# Patient Record
Sex: Female | Born: 1975
Health system: Southern US, Community
[De-identification: ages and names within clinical notes are randomized; demographics above are authoritative.]

## PROBLEM LIST (undated history)

## (undated) DIAGNOSIS — F32A Depression, unspecified: Secondary | ICD-10-CM

## (undated) DIAGNOSIS — I209 Angina pectoris, unspecified: Secondary | ICD-10-CM

## (undated) DIAGNOSIS — I509 Heart failure, unspecified: Secondary | ICD-10-CM

## (undated) DIAGNOSIS — F419 Anxiety disorder, unspecified: Secondary | ICD-10-CM

## (undated) DIAGNOSIS — F329 Major depressive disorder, single episode, unspecified: Secondary | ICD-10-CM

## (undated) DIAGNOSIS — I472 Ventricular tachycardia, unspecified: Secondary | ICD-10-CM

## (undated) DIAGNOSIS — R0602 Shortness of breath: Secondary | ICD-10-CM

## (undated) DIAGNOSIS — I499 Cardiac arrhythmia, unspecified: Secondary | ICD-10-CM

## (undated) DIAGNOSIS — E669 Obesity, unspecified: Secondary | ICD-10-CM

## (undated) DIAGNOSIS — I2510 Atherosclerotic heart disease of native coronary artery without angina pectoris: Secondary | ICD-10-CM

## (undated) DIAGNOSIS — I213 ST elevation (STEMI) myocardial infarction of unspecified site: Secondary | ICD-10-CM

## (undated) DIAGNOSIS — E785 Hyperlipidemia, unspecified: Secondary | ICD-10-CM

## (undated) DIAGNOSIS — Z720 Tobacco use: Secondary | ICD-10-CM

## (undated) DIAGNOSIS — I4729 Other ventricular tachycardia: Secondary | ICD-10-CM

## (undated) DIAGNOSIS — K219 Gastro-esophageal reflux disease without esophagitis: Secondary | ICD-10-CM

## (undated) DIAGNOSIS — J45909 Unspecified asthma, uncomplicated: Secondary | ICD-10-CM

## (undated) DIAGNOSIS — I2109 ST elevation (STEMI) myocardial infarction involving other coronary artery of anterior wall: Principal | ICD-10-CM

## (undated) DIAGNOSIS — M1990 Unspecified osteoarthritis, unspecified site: Secondary | ICD-10-CM

## (undated) DIAGNOSIS — R51 Headache: Secondary | ICD-10-CM

## (undated) HISTORY — PX: TUBAL LIGATION: SHX77

## (undated) HISTORY — DX: Tobacco use: Z72.0

## (undated) HISTORY — PX: GALLBLADDER SURGERY: SHX652

## (undated) HISTORY — PX: CORONARY ANGIOPLASTY WITH STENT PLACEMENT: SHX49

## (undated) HISTORY — PX: CHOLECYSTECTOMY: SHX55

---

## 2001-04-17 ENCOUNTER — Emergency Department (HOSPITAL_COMMUNITY): Admission: EM | Admit: 2001-04-17 | Discharge: 2001-04-17 | Payer: Self-pay | Admitting: Emergency Medicine

## 2001-05-15 ENCOUNTER — Emergency Department (HOSPITAL_COMMUNITY): Admission: EM | Admit: 2001-05-15 | Discharge: 2001-05-15 | Payer: Self-pay | Admitting: Internal Medicine

## 2001-05-15 ENCOUNTER — Emergency Department: Admission: EM | Admit: 2001-05-15 | Discharge: 2001-05-15 | Payer: Self-pay | Admitting: Emergency Medicine

## 2001-09-11 ENCOUNTER — Encounter: Payer: Self-pay | Admitting: Emergency Medicine

## 2001-09-11 ENCOUNTER — Emergency Department (HOSPITAL_COMMUNITY): Admission: EM | Admit: 2001-09-11 | Discharge: 2001-09-11 | Payer: Self-pay | Admitting: Emergency Medicine

## 2007-10-15 ENCOUNTER — Emergency Department (HOSPITAL_COMMUNITY): Admission: EM | Admit: 2007-10-15 | Discharge: 2007-10-15 | Payer: Self-pay | Admitting: Family Medicine

## 2008-01-09 ENCOUNTER — Emergency Department (HOSPITAL_COMMUNITY): Admission: EM | Admit: 2008-01-09 | Discharge: 2008-01-09 | Payer: Self-pay | Admitting: Family Medicine

## 2008-04-08 ENCOUNTER — Emergency Department (HOSPITAL_COMMUNITY): Admission: EM | Admit: 2008-04-08 | Discharge: 2008-04-08 | Payer: Self-pay | Admitting: Family Medicine

## 2008-08-10 ENCOUNTER — Emergency Department (HOSPITAL_COMMUNITY): Admission: EM | Admit: 2008-08-10 | Discharge: 2008-08-10 | Payer: Self-pay | Admitting: Emergency Medicine

## 2009-01-20 ENCOUNTER — Emergency Department (HOSPITAL_COMMUNITY): Admission: EM | Admit: 2009-01-20 | Discharge: 2009-01-20 | Payer: Self-pay | Admitting: Family Medicine

## 2009-01-20 ENCOUNTER — Emergency Department (HOSPITAL_COMMUNITY): Admission: EM | Admit: 2009-01-20 | Discharge: 2009-01-20 | Payer: Self-pay | Admitting: Emergency Medicine

## 2009-02-10 ENCOUNTER — Emergency Department (HOSPITAL_COMMUNITY): Admission: EM | Admit: 2009-02-10 | Discharge: 2009-02-10 | Payer: Self-pay | Admitting: Family Medicine

## 2009-09-08 ENCOUNTER — Emergency Department (HOSPITAL_COMMUNITY): Admission: EM | Admit: 2009-09-08 | Discharge: 2009-09-08 | Payer: Self-pay | Admitting: Family Medicine

## 2009-10-12 ENCOUNTER — Emergency Department (HOSPITAL_COMMUNITY): Admission: EM | Admit: 2009-10-12 | Discharge: 2009-10-12 | Payer: Self-pay | Admitting: Emergency Medicine

## 2010-09-22 ENCOUNTER — Emergency Department (HOSPITAL_COMMUNITY)
Admission: EM | Admit: 2010-09-22 | Discharge: 2010-09-22 | Payer: Self-pay | Source: Home / Self Care | Admitting: Emergency Medicine

## 2010-11-22 LAB — GC/CHLAMYDIA PROBE AMP, GENITAL
Chlamydia, DNA Probe: NEGATIVE
GC Probe Amp, Genital: NEGATIVE

## 2010-11-22 LAB — POCT PREGNANCY, URINE: Preg Test, Ur: NEGATIVE

## 2010-11-24 ENCOUNTER — Inpatient Hospital Stay (INDEPENDENT_AMBULATORY_CARE_PROVIDER_SITE_OTHER)
Admission: RE | Admit: 2010-11-24 | Discharge: 2010-11-24 | Disposition: A | Discharge status: Home / Self Care | Payer: Self-pay | Source: Ambulatory Visit | Attending: Emergency Medicine | Admitting: Emergency Medicine

## 2010-11-24 DIAGNOSIS — N390 Urinary tract infection, site not specified: Secondary | ICD-10-CM

## 2010-11-24 LAB — POCT URINALYSIS DIP (DEVICE)
Bilirubin Urine: NEGATIVE
Glucose, UA: >=1000 mg/dL — AB
Hgb urine dipstick: NEGATIVE
Ketones, ur: NEGATIVE mg/dL
Nitrite: POSITIVE — AB
Protein, ur: NEGATIVE mg/dL
Specific Gravity, Urine: 1.015 (ref 1.005–1.030)
Urobilinogen, UA: 0.2 mg/dL (ref 0.0–1.0)
pH: 5.5 (ref 5.0–8.0)

## 2010-11-27 LAB — URINE CULTURE
Colony Count: >=100000
Culture  Setup Time: 201203200126

## 2010-12-16 LAB — URINALYSIS, ROUTINE W REFLEX MICROSCOPIC
Glucose, UA: NEGATIVE mg/dL
Hgb urine dipstick: NEGATIVE
Ketones, ur: NEGATIVE mg/dL
Protein, ur: NEGATIVE mg/dL
Urobilinogen, UA: 0.2 mg/dL (ref 0.0–1.0)

## 2010-12-16 LAB — POCT I-STAT, CHEM 8
BUN: 8 mg/dL (ref 6–23)
Calcium, Ion: 1.18 mmol/L (ref 1.12–1.32)
Creatinine, Ser: 0.6 mg/dL (ref 0.4–1.2)
Hemoglobin: 15.3 g/dL — ABNORMAL HIGH (ref 12.0–15.0)
Sodium: 141 mEq/L (ref 135–145)
TCO2: 25 mmol/L (ref 0–100)

## 2010-12-16 LAB — POCT PREGNANCY, URINE: Preg Test, Ur: NEGATIVE

## 2011-04-21 ENCOUNTER — Emergency Department (HOSPITAL_COMMUNITY)
Admission: EM | Admit: 2011-04-21 | Discharge: 2011-04-22 | Discharge status: Against Medical Advice | Payer: Self-pay | Attending: Emergency Medicine | Admitting: Emergency Medicine

## 2011-04-21 DIAGNOSIS — R109 Unspecified abdominal pain: Secondary | ICD-10-CM | POA: Insufficient documentation

## 2011-04-22 LAB — URINALYSIS, ROUTINE W REFLEX MICROSCOPIC
Bilirubin Urine: NEGATIVE
Hgb urine dipstick: NEGATIVE
Ketones, ur: NEGATIVE mg/dL
Specific Gravity, Urine: 1.022 (ref 1.005–1.030)
pH: 6 (ref 5.0–8.0)

## 2011-04-22 LAB — URINE MICROSCOPIC-ADD ON

## 2011-05-29 LAB — POCT PREGNANCY, URINE
Operator id: 247071
Preg Test, Ur: NEGATIVE

## 2011-05-29 LAB — POCT URINALYSIS DIP (DEVICE)
Bilirubin Urine: NEGATIVE
Glucose, UA: NEGATIVE
Nitrite: NEGATIVE
Operator id: 247071
Urobilinogen, UA: 0.2

## 2011-05-29 LAB — WET PREP, GENITAL: Trich, Wet Prep: NONE SEEN

## 2011-06-05 LAB — POCT URINALYSIS DIP (DEVICE)
Glucose, UA: NEGATIVE
Ketones, ur: NEGATIVE
Protein, ur: NEGATIVE

## 2011-06-05 LAB — POCT PREGNANCY, URINE: Preg Test, Ur: NEGATIVE

## 2011-06-11 LAB — GC/CHLAMYDIA PROBE AMP, GENITAL
Chlamydia, DNA Probe: NEGATIVE
GC Probe Amp, Genital: NEGATIVE

## 2011-06-11 LAB — WET PREP, GENITAL: Yeast Wet Prep HPF POC: NONE SEEN

## 2011-06-19 ENCOUNTER — Encounter (HOSPITAL_COMMUNITY): Payer: Self-pay | Admitting: *Deleted

## 2011-06-19 ENCOUNTER — Inpatient Hospital Stay (HOSPITAL_COMMUNITY)
Admission: AD | Admit: 2011-06-19 | Discharge: 2011-06-19 | Disposition: A | Discharge status: Home / Self Care | Payer: Self-pay | Source: Ambulatory Visit | Attending: Obstetrics and Gynecology | Admitting: Obstetrics and Gynecology

## 2011-06-19 ENCOUNTER — Inpatient Hospital Stay (HOSPITAL_COMMUNITY): Payer: Self-pay

## 2011-06-19 ENCOUNTER — Inpatient Hospital Stay (INDEPENDENT_AMBULATORY_CARE_PROVIDER_SITE_OTHER)
Admission: RE | Admit: 2011-06-19 | Discharge: 2011-06-19 | Disposition: A | Discharge status: Home / Self Care | Payer: Self-pay | Source: Ambulatory Visit | Attending: Family Medicine | Admitting: Family Medicine

## 2011-06-19 DIAGNOSIS — N938 Other specified abnormal uterine and vaginal bleeding: Secondary | ICD-10-CM | POA: Insufficient documentation

## 2011-06-19 DIAGNOSIS — N949 Unspecified condition associated with female genital organs and menstrual cycle: Secondary | ICD-10-CM | POA: Insufficient documentation

## 2011-06-19 DIAGNOSIS — D259 Leiomyoma of uterus, unspecified: Secondary | ICD-10-CM | POA: Insufficient documentation

## 2011-06-19 DIAGNOSIS — D219 Benign neoplasm of connective and other soft tissue, unspecified: Secondary | ICD-10-CM

## 2011-06-19 LAB — POCT URINALYSIS DIP (DEVICE)
Bilirubin Urine: NEGATIVE
Glucose, UA: 500 mg/dL — AB
Specific Gravity, Urine: 1.01 (ref 1.005–1.030)
Urobilinogen, UA: 0.2 mg/dL (ref 0.0–1.0)

## 2011-06-19 LAB — WET PREP, GENITAL

## 2011-06-19 LAB — CBC
HCT: 37.3 % (ref 36.0–46.0)
MCV: 80 fL (ref 78.0–100.0)
RBC: 4.66 MIL/uL (ref 3.87–5.11)
WBC: 8.6 10*3/uL (ref 4.0–10.5)

## 2011-06-19 MED ORDER — MEDROXYPROGESTERONE ACETATE 10 MG PO TABS: 10.0000 mg | ORAL_TABLET | Freq: Every day | ORAL | Status: DC

## 2011-06-19 MED ORDER — IBUPROFEN 600 MG PO TABS: 600.0000 mg | ORAL_TABLET | Freq: Four times a day (QID) | ORAL | Status: AC | PRN

## 2011-06-19 MED ORDER — METRONIDAZOLE 500 MG PO TABS: 500.0000 mg | ORAL_TABLET | Freq: Two times a day (BID) | ORAL | Status: AC

## 2011-06-19 NOTE — ED Provider Notes (Signed)
History     Chief Complaint  Patient presents with  . Vaginal Bleeding   HPI Pt states she was seen at Urgent Care and sent to [facility] for evaluation. Pt states her LMP was 9-25 and last approximately 5-7 days.  Then reports she started bleeding again on 10-11 and has continued. Also, passed a moderate size clot. Started having mid back pain and lower abdominal pain yesterday. Denies UTI symptoms or pelvic pain.  Reports no prior history of irregular bleeding.   Past Medical History  Diagnosis Date  . Diabetes mellitus     2002; no meds since 2004    Past Surgical History  Procedure Date  . Cesarean section   . Gallbladder surgery   . Tubal ligation     2000    No family history on file.  History  Substance Use Topics  . Smoking status: Current Everyday Smoker    Types: Cigarettes  . Smokeless tobacco: Not on file  . Alcohol Use: Yes    Allergies:  Allergies  Allergen Reactions  . Erythromycin Anaphylaxis  . Penicillins Anaphylaxis    Prescriptions prior to admission  Medication Sig Dispense Refill  . aspirin 325 MG tablet Take 650 mg by mouth daily as needed. For pain       . doxylamine, Sleep, (UNISOM) 25 MG tablet Take 25 mg by mouth once a week. Pt states that she takes this medication every Sat and Sun as needed for sleep         Review of Systems  Gastrointestinal: Positive for abdominal pain.  Genitourinary:       Vaginal bleeding  Musculoskeletal: Positive for back pain.  All other systems reviewed and are negative.   Physical Exam   Blood pressure 139/79, pulse 86, temperature 98.5 F (36.9 C), temperature source Oral, resp. rate 20, height 5' 3.5" (1.613 m), weight 115.123 kg (253 lb 12.8 oz), last menstrual period 06/02/2011, SpO2 96.00%.  Physical Exam  Constitutional: She is oriented to person, place, and time. She appears well-developed and well-nourished.  HENT:  Head: Normocephalic.  Neck: Normal range of motion. Neck supple.    Cardiovascular: Normal rate, regular rhythm and normal heart sounds.   Respiratory: Effort normal and breath sounds normal.  GI: Soft. She exhibits no mass. There is no tenderness. There is no guarding.  Genitourinary: There is bleeding (negative clots) around the vagina.  Neurological: She is alert and oriented to person, place, and time.  Skin: Skin is warm and dry.    MAU Course  Procedures  CBC - hgb 12.9 Wet prep - +clue GC/CT - pend [place] - A small focal mural fibroid is seen in the right  anterolateral upper uterine segment measuring 0.9 x 0.9 x 1.0 cm.  The remainder of the myometrium is homogeneous.   Assessment and Plan  Fibroids Abnormal Vaginal Bleeding  Plan: RX Provera RX Flagyl Schedule f/u appt in GYN clinic  [facility] 06/19/2011, 2:00 PM

## 2011-06-19 NOTE — Progress Notes (Signed)
Pt sent from UC because of abdnormall periods.  C/o lower abdominal pain and back pain.

## 2011-06-19 NOTE — Progress Notes (Signed)
Pt states she was seen at Urgent Care and sent to [facility] for evaluation. Pt states her LMP was 9-25 then she started bleeding again on 10-11. Passed a moderate size clot. Started having mid back pain and lower abdominal pain yesterday.

## 2011-06-20 LAB — GC/CHLAMYDIA PROBE AMP, GENITAL: Chlamydia, DNA Probe: NEGATIVE

## 2011-06-21 NOTE — ED Provider Notes (Signed)
Agree with above note.  Maureen Price 06/21/2011 7:53 AM   

## 2011-09-19 ENCOUNTER — Emergency Department (HOSPITAL_COMMUNITY)
Admission: EM | Admit: 2011-09-19 | Discharge: 2011-09-20 | Disposition: A | Discharge status: Home / Self Care | Payer: Self-pay | Attending: Emergency Medicine | Admitting: Emergency Medicine

## 2011-09-19 ENCOUNTER — Encounter (HOSPITAL_COMMUNITY): Payer: Self-pay | Admitting: *Deleted

## 2011-09-19 DIAGNOSIS — F17200 Nicotine dependence, unspecified, uncomplicated: Secondary | ICD-10-CM | POA: Insufficient documentation

## 2011-09-19 DIAGNOSIS — R197 Diarrhea, unspecified: Secondary | ICD-10-CM | POA: Insufficient documentation

## 2011-09-19 DIAGNOSIS — E119 Type 2 diabetes mellitus without complications: Secondary | ICD-10-CM | POA: Insufficient documentation

## 2011-09-19 DIAGNOSIS — Z79899 Other long term (current) drug therapy: Secondary | ICD-10-CM | POA: Insufficient documentation

## 2011-09-19 DIAGNOSIS — R110 Nausea: Secondary | ICD-10-CM | POA: Insufficient documentation

## 2011-09-19 DIAGNOSIS — R109 Unspecified abdominal pain: Secondary | ICD-10-CM | POA: Insufficient documentation

## 2011-09-19 LAB — COMPREHENSIVE METABOLIC PANEL
ALT: 12 U/L (ref 0–35)
AST: 12 U/L (ref 0–37)
Albumin: 3.5 g/dL (ref 3.5–5.2)
Alkaline Phosphatase: 86 U/L (ref 39–117)
BUN: 8 mg/dL (ref 6–23)
CO2: 27 mEq/L (ref 19–32)
Calcium: 9.1 mg/dL (ref 8.4–10.5)
Chloride: 104 mEq/L (ref 96–112)
Creatinine, Ser: 0.51 mg/dL (ref 0.50–1.10)
GFR calc Af Amer: >90 mL/min (ref >90)
GFR calc non Af Amer: >90 mL/min (ref >90)
Glucose, Bld: 78 mg/dL (ref 70–99)
Potassium: 3.7 mEq/L (ref 3.5–5.1)
Sodium: 140 mEq/L (ref 135–145)
Total Bilirubin: 0.2 mg/dL — ABNORMAL LOW (ref 0.3–1.2)
Total Protein: 7.2 g/dL (ref 6.0–8.3)

## 2011-09-19 LAB — DIFFERENTIAL
Basophils Absolute: 0 10*3/uL (ref 0.0–0.1)
Basophils Relative: 0 % (ref 0–1)
Eosinophils Absolute: 0.2 10*3/uL (ref 0.0–0.7)
Eosinophils Relative: 2 % (ref 0–5)
Lymphocytes Relative: 42 % (ref 12–46)
Lymphs Abs: 4 10*3/uL (ref 0.7–4.0)
Monocytes Absolute: 0.7 10*3/uL (ref 0.1–1.0)
Monocytes Relative: 8 % (ref 3–12)
Neutro Abs: 4.5 10*3/uL (ref 1.7–7.7)
Neutrophils Relative %: 48 % (ref 43–77)

## 2011-09-19 LAB — URINALYSIS, ROUTINE W REFLEX MICROSCOPIC
Bilirubin Urine: NEGATIVE
Glucose, UA: NEGATIVE mg/dL
Hgb urine dipstick: NEGATIVE
Ketones, ur: NEGATIVE mg/dL
Leukocytes, UA: NEGATIVE
Nitrite: NEGATIVE
Protein, ur: NEGATIVE mg/dL
Specific Gravity, Urine: 1.01 (ref 1.005–1.030)
Urobilinogen, UA: 0.2 mg/dL (ref 0.0–1.0)
pH: 6 (ref 5.0–8.0)

## 2011-09-19 LAB — LIPASE, BLOOD: Lipase: 14 U/L (ref 11–59)

## 2011-09-19 LAB — CBC
HCT: 38.6 % (ref 36.0–46.0)
Hemoglobin: 13.8 g/dL (ref 12.0–15.0)
MCH: 28.6 pg (ref 26.0–34.0)
MCHC: 35.8 g/dL (ref 30.0–36.0)
MCV: 79.9 fL (ref 78.0–100.0)
Platelets: 317 10*3/uL (ref 150–400)
RBC: 4.83 MIL/uL (ref 3.87–5.11)
RDW: 13.8 % (ref 11.5–15.5)
WBC: 9.4 10*3/uL (ref 4.0–10.5)

## 2011-09-19 MED ORDER — SODIUM CHLORIDE 0.9 % IV BOLUS (SEPSIS)
1000.0000 mL | Freq: Once | INTRAVENOUS | Status: AC
  Administered 2011-09-19: 1000 mL via INTRAVENOUS

## 2011-09-19 MED ORDER — ONDANSETRON HCL 4 MG/2ML IJ SOLN
4.0000 mg | Freq: Once | INTRAMUSCULAR | Status: AC
  Administered 2011-09-19: 4 mg via INTRAVENOUS
  Filled 2011-09-19: qty 2

## 2011-09-19 MED ORDER — ONDANSETRON HCL 4 MG/2ML IJ SOLN
4.0000 mg | Freq: Once | INTRAMUSCULAR | Status: AC
  Administered 2011-09-20: 4 mg via INTRAVENOUS
  Filled 2011-09-19: qty 2

## 2011-09-19 MED ORDER — PANTOPRAZOLE SODIUM 40 MG IV SOLR
40.0000 mg | Freq: Once | INTRAVENOUS | Status: AC
  Administered 2011-09-20: 40 mg via INTRAVENOUS
  Filled 2011-09-19: qty 40

## 2011-09-19 MED ORDER — GI COCKTAIL ~~LOC~~
30.0000 mL | Freq: Once | ORAL | Status: AC
  Administered 2011-09-19: 30 mL via ORAL
  Filled 2011-09-19: qty 30

## 2011-09-19 MED ORDER — MORPHINE SULFATE 4 MG/ML IJ SOLN
4.0000 mg | Freq: Once | INTRAMUSCULAR | Status: AC
  Administered 2011-09-19: 4 mg via INTRAVENOUS
  Filled 2011-09-19: qty 1

## 2011-09-19 MED ORDER — HYDROMORPHONE HCL PF 1 MG/ML IJ SOLN
1.0000 mg | Freq: Once | INTRAMUSCULAR | Status: AC
  Administered 2011-09-20: 1 mg via INTRAVENOUS
  Filled 2011-09-19: qty 1

## 2011-09-19 NOTE — ED Provider Notes (Signed)
History     CSN: [id number]  Arrival date & time 09/19/11  1800   First MD Initiated Contact with Patient 09/19/11 2117      Chief Complaint  Patient presents with  . Abdominal Pain    Patient is a 36 y.o. female presenting with abdominal pain.  Abdominal Pain The primary symptoms of the illness include abdominal pain, nausea and diarrhea. The primary symptoms of the illness do not include fever, vomiting or dysuria. The current episode started more than 2 days ago. The onset of the illness was gradual. The problem has been gradually worsening.  The illness is associated with eating. The patient states that she believes she is currently not pregnant. The patient has had a change in bowel habit. Symptoms associated with the illness do not include chills, diaphoresis or back pain. Significant associated medical issues include GERD.  Patient reports onset of upper abdominal pain Tuesday. States pain has been associated with nausea but no vomiting, and is worsened by eating. She does report onset of diarrhea yesterday. Has had 8 episodes of diarrhea in the last 24 hours. Patient is status post cholecystectomy in 1995. Denies fever chest pain shortness of breath or other symptoms.  Past Medical History  Diagnosis Date  . Diabetes mellitus     2002; no meds since 2004    Past Surgical History  Procedure Date  . Cesarean section   . Gallbladder surgery   . Tubal ligation     2000    History reviewed. No pertinent family history.  History  Substance Use Topics  . Smoking status: Current Everyday Smoker    Types: Cigarettes  . Smokeless tobacco: Not on file  . Alcohol Use: Yes    OB History    Grav Para Term Preterm Abortions TAB SAB Ect Mult Living   4 3 3  1  1   3       Review of Systems  Constitutional: Negative.  Negative for fever, chills and diaphoresis.  HENT: Negative.   Eyes: Negative.   Respiratory: Negative.   Cardiovascular: Negative.   Gastrointestinal:  Positive for nausea, abdominal pain and diarrhea. Negative for vomiting.  Genitourinary: Negative.  Negative for dysuria.  Musculoskeletal: Negative.  Negative for back pain.  Skin: Negative.   Neurological: Negative.   Hematological: Negative.   Psychiatric/Behavioral: Negative.     Allergies  Erythromycin and Penicillins  Home Medications   Current Outpatient Rx  Name Route Sig Dispense Refill  . DIPHENHYDRAMINE-APAP (SLEEP) 25-500 MG PO TABS Oral Take 1 tablet by mouth at bedtime as needed. For sleep      BP 137/74  Pulse 84  Temp(Src) 98 F (36.7 C) (Oral)  Resp 20  SpO2 98%  Physical Exam  Constitutional: She is oriented to person, place, and time. She appears well-developed and well-nourished.  HENT:  Head: Normocephalic and atraumatic.  Eyes: Conjunctivae are normal.  Neck: Neck supple.  Cardiovascular: Normal rate and regular rhythm.   Pulmonary/Chest: Effort normal and breath sounds normal.  Abdominal: Soft. Bowel sounds are normal.         Mild (objective) TTP over epigastrium that rdaiates to upper (L) abd.  Musculoskeletal: Normal range of motion.  Neurological: She is alert and oriented to person, place, and time.  Skin: Skin is warm and dry. No erythema.  Psychiatric: She has a normal mood and affect.    ED Course  Procedures Patient reports minimal relief of abdominal pain with IV morphine and GI cocktail.  Findings discussed with patient. I have discussed patient with Dr. Ranae Palms. Will give IV Protonix, repeat pain medicine in an attempt to get patient's pain under control. There has been no  diarrhea since arrival to the department.  0030: Pt now reports significant relief of pain after IV dilaudid and Protonix IV. Findings and clinical impression discussed w/ pt. Will plan for d/c home w/ meds for nausea, a short course of medication for pain and encourage pt to get established w/ a PCP. Referrals provided. Pt agreeable w/ plan.   Labs Reviewed    COMPREHENSIVE METABOLIC PANEL - Abnormal; Notable for the following:    Total Bilirubin 0.2 (*)    All other components within normal limits  CBC  DIFFERENTIAL  LIPASE, BLOOD  URINALYSIS, ROUTINE W REFLEX MICROSCOPIC  POCT PREGNANCY, URINE  POCT PREGNANCY, URINE   No results found.   No diagnosis found.    MDM  No significant dehydration, leukocytosis, fever or chemistry abnormalities. Abd exam w/o acute TTP. No active vomiting or diarrhea in ED Doubt acute abd process HPI/PE and clinical findings/course c/w a viral gastroenteritis          Leanne Chang, NP 09/21/11 [phone]

## 2011-09-19 NOTE — ED Notes (Signed)
Patient with abdominal pain since Wednesday and diarrhea with nausea.  Pain is intermittent.

## 2011-09-19 NOTE — ED Notes (Signed)
Received bedside report from [place].  Patient currently sitting up in bed; no respiratory or acute distress noted.  Patient updated on plan of care; informed patient that IV would be started and that medications have been ordered.  Introduced self to patient and updated whiteboard in room. Patient has no other questions or concerns at this time; will continue to monitor.

## 2011-09-19 NOTE — ED Notes (Signed)
Patient currently sitting up in bed; no respiratory or acute distress noted.  Patient updated on plan of care; informed patient that she will be sent for a CT scan.  Patient states that the Morphine has not helped with her pain; MD notified.  Family present at bedside.  Patient has no other questions or concerns at this time; will continue to monitor.

## 2011-09-20 MED ORDER — PROMETHAZINE HCL 25 MG PO TABS: 25.0000 mg | ORAL_TABLET | Freq: Four times a day (QID) | ORAL | Status: DC | PRN

## 2011-09-20 MED ORDER — HYDROCODONE-ACETAMINOPHEN 5-500 MG PO TABS: 1.0000 | ORAL_TABLET | Freq: Four times a day (QID) | ORAL | Status: AC | PRN

## 2011-09-20 NOTE — ED Provider Notes (Signed)
History     CSN: [id number]  Arrival date & time 09/19/11  1800   First MD Initiated Contact with Patient 09/19/11 2117      Chief Complaint  Patient presents with  . Abdominal Pain    (Consider location/radiation/quality/duration/timing/severity/associated sxs/prior treatment) HPI  Past Medical History  Diagnosis Date  . Diabetes mellitus     2002; no meds since 2004    Past Surgical History  Procedure Date  . Cesarean section   . Gallbladder surgery   . Tubal ligation     2000    History reviewed. No pertinent family history.  History  Substance Use Topics  . Smoking status: Current Everyday Smoker    Types: Cigarettes  . Smokeless tobacco: Not on file  . Alcohol Use: Yes    OB History    Grav Para Term Preterm Abortions TAB SAB Ect Mult Living   4 3 3  1  1   3       Review of Systems  Allergies  Erythromycin and Penicillins  Home Medications   Current Outpatient Rx  Name Route Sig Dispense Refill  . DIPHENHYDRAMINE-APAP (SLEEP) 25-500 MG PO TABS Oral Take 1 tablet by mouth at bedtime as needed. For sleep    . HYDROCODONE-ACETAMINOPHEN 5-500 MG PO TABS Oral Take 1-2 tablets by mouth every 6 (six) hours as needed for pain. 15 tablet 0  . PROMETHAZINE HCL 25 MG PO TABS Oral Take 1 tablet (25 mg total) by mouth every 6 (six) hours as needed for nausea. 10 tablet 0    BP 109/72  Pulse 85  Temp(Src) 97 F (36.1 C) (Oral)  Resp 18  SpO2 95%  Physical Exam  ED Course  Procedures (including critical care time)  Labs Reviewed  COMPREHENSIVE METABOLIC PANEL - Abnormal; Notable for the following:    Total Bilirubin 0.2 (*)    All other components within normal limits  CBC  DIFFERENTIAL  LIPASE, BLOOD  URINALYSIS, ROUTINE W REFLEX MICROSCOPIC  POCT PREGNANCY, URINE  LAB REPORT - SCANNED  POCT PREGNANCY, URINE   No results found.   1. Abdominal pain   2. Nausea   3. Diarrhea       MDM          Loren Racer, MD 09/20/11  [phone]

## 2011-09-20 NOTE — ED Notes (Signed)
Patient given discharge paperwork; went over discharge instructions with patient.  Instructed patient to take Vicodin and Phenergan as directed, to follow up with [facility], and to return to the ED for new, worsening, or concerning symptoms.

## 2011-09-20 NOTE — ED Notes (Signed)
Patient currently sitting up in bed; no respiratory or acute distress noted.  Patient requesting more pain medication; informed patient that medications have been ordered and that they are being pulled out of the Pyxis.  Patient has no other questions or concerns at this time.  Will continue to monitor.

## 2011-09-20 NOTE — ED Notes (Addendum)
Patient currently resting quietly in bed; no respiratory or acute distress noted.  Updated patient on plan of care; informed patient that we are waiting on discharge paperwork from NP.  Patient requesting pill rather than liquid prescriptions.  Natalia Leatherwood, NP notified.  Patient has no other questions or concerns at this time.  Will continue to monitor.

## 2011-09-23 NOTE — ED Provider Notes (Signed)
Medical screening examination/treatment/procedure(s) were performed by non-physician practitioner and as supervising physician I was immediately available for consultation/collaboration.  Loren Racer, MD 09/23/11 [phone]

## 2011-12-17 ENCOUNTER — Emergency Department (INDEPENDENT_AMBULATORY_CARE_PROVIDER_SITE_OTHER)
Admission: EM | Admit: 2011-12-17 | Discharge: 2011-12-17 | Disposition: A | Discharge status: Home / Self Care | Payer: Self-pay | Source: Home / Self Care | Attending: Emergency Medicine | Admitting: Emergency Medicine

## 2011-12-17 ENCOUNTER — Encounter (HOSPITAL_COMMUNITY): Payer: Self-pay | Admitting: Cardiology

## 2011-12-17 DIAGNOSIS — E119 Type 2 diabetes mellitus without complications: Secondary | ICD-10-CM

## 2011-12-17 DIAGNOSIS — E669 Obesity, unspecified: Secondary | ICD-10-CM

## 2011-12-17 LAB — POCT URINALYSIS DIP (DEVICE)
Bilirubin Urine: NEGATIVE
Hgb urine dipstick: NEGATIVE
Ketones, ur: NEGATIVE mg/dL
Nitrite: NEGATIVE
Protein, ur: NEGATIVE mg/dL
pH: 6 (ref 5.0–8.0)

## 2011-12-17 LAB — POCT I-STAT, CHEM 8
Chloride: 106 mEq/L (ref 96–112)
Creatinine, Ser: 0.6 mg/dL (ref 0.50–1.10)
Glucose, Bld: 165 mg/dL — ABNORMAL HIGH (ref 70–99)
Potassium: 3.8 mEq/L (ref 3.5–5.1)

## 2011-12-17 MED ORDER — METFORMIN HCL 500 MG PO TABS: 500.0000 mg | ORAL_TABLET | Freq: Two times a day (BID) | ORAL | Status: DC

## 2011-12-17 MED ORDER — TRAMADOL HCL 50 MG PO TABS: 100.0000 mg | ORAL_TABLET | Freq: Three times a day (TID) | ORAL | Status: DC | PRN

## 2011-12-17 NOTE — ED Notes (Signed)
Pt has had elevated blood sugars for the past 2 weeks. Pt has not been followed by a primary doctor. She had not take oral meds for 5 years. Pt also unsure if pregnant. Last period last month. Denies fever. Nausea started 1 week ago. Pain has generalize aches all over.

## 2011-12-17 NOTE — Discharge Instructions (Signed)
Diabetes, Type 2  Diabetes is a long-lasting (chronic) disease. In type 2 diabetes, the pancreas does not make enough insulin (a hormone), and the body does not respond normally to the insulin that is made. This type of diabetes was also previously called adult-onset diabetes. It usually occurs after the age of 40, but it can occur at any age.   CAUSES   Type 2 diabetes happens because the pancreasis not making enough insulin or your body has trouble using the insulin that your pancreas does make properly.  SYMPTOMS    Drinking more than usual.   Urinating more than usual.   Blurred vision.   Dry, itchy skin.   Frequent infections.   Feeling more tired than usual (fatigue).  DIAGNOSIS  The diagnosis of type 2 diabetes is usually made by one of the following tests:   Fasting blood glucose test. You will not eat for at least 8 hours and then take a blood test.   Random blood glucose test. Your blood glucose (sugar) is checked at any time of the day regardless of when you ate.   Oral glucose tolerance test (OGTT). Your blood glucose is measured after you have not eaten (fasted) and then after you drink a glucose containing beverage.  TREATMENT    Healthy eating.   Exercise.   Medicine, if needed.   Monitoring blood glucose.   Seeing your caregiver regularly.  HOME CARE INSTRUCTIONS    Check your blood glucose at least once a day. More frequent monitoring may be necessary, depending on your medicines and on how well your diabetes is controlled. Your caregiver will advise you.   Take your medicine as directed by your caregiver.   Do not smoke.   Make wise food choices. Ask your caregiver for information. Weight loss can improve your diabetes.   Learn about low blood glucose (hypoglycemia) and how to treat it.   Get your eyes checked regularly.   Have a yearly physical exam. Have your blood pressure checked and your blood and urine tested.   Wear a pendant or bracelet saying that you have  diabetes.   Check your feet every night for cuts, sores, blisters, and redness. Let your caregiver know if you have any problems.  SEEK MEDICAL CARE IF:    You have problems keeping your blood glucose in target range.   You have problems with your medicines.   You have symptoms of an illness that do not improve after 24 hours.   You have a sore or wound that is not healing.   You notice a change in vision or a new problem with your vision.   You have a fever.  MAKE SURE YOU:   Understand these instructions.   Will watch your condition.   Will get help right away if you are not doing well or get worse.  Document Released: 08/24/2005 Document Revised: 08/13/2011 Document Reviewed: 02/09/2011  [employer] Patient Information 2012 [employer]  Diabetes Meal Planning Guide  The diabetes meal planning guide is a tool to help you plan your meals and snacks. It is important for people with diabetes to manage their blood glucose (sugar) levels. Choosing the right foods and the right amounts throughout your day will help control your blood glucose. Eating right can even help you improve your blood pressure and reach or maintain a healthy weight.  CARBOHYDRATE COUNTING MADE EASY  When you eat carbohydrates, they turn to sugar. This raises your blood glucose level. Counting carbohydrates   can help you control this level so you feel better. When you plan your meals by counting carbohydrates, you can have more flexibility in what you eat and balance your medicine with your food intake.  Carbohydrate counting simply means adding up the total amount of carbohydrate grams in your meals and snacks. Try to eat about the same amount at each meal. Foods with carbohydrates are listed below. Each portion below is 1 carbohydrate serving or 15 grams of carbohydrates. Ask your dietician how many grams of carbohydrates you should eat at each meal or snack.  Grains and Starches   1 slice bread.    English muffin or  hotdog/hamburger bun.    cup cold cereal (unsweetened).   ? cup cooked pasta or rice.    cup starchy vegetables (corn, potatoes, peas, beans, winter squash).   1 tortilla (6 inches).    bagel.   1 waffle or pancake (size of a CD).    cup cooked cereal.   4 to 6 small crackers.  *Whole grain is recommended.  Fruit   1 cup fresh unsweetened berries, melon, papaya, pineapple.   1 small fresh fruit.    banana or mango.    cup fruit juice (4 oz unsweetened).    cup canned fruit in natural juice or water.   2 tbs dried fruit.   12 to 15 grapes or cherries.  Milk and Yogurt   1 cup fat-free or 1% milk.   1 cup soy milk.   6 oz light yogurt with sugar-free sweetener.   6 oz low-fat soy yogurt.   6 oz plain yogurt.  Vegetables   1 cup raw or  cup cooked is counted as 0 carbohydrates or a "free" food.   If you eat 3 or more servings at 1 meal, count them as 1 carbohydrate serving.  Other Carbohydrates    oz chips or pretzels.    cup ice cream or frozen yogurt.    cup sherbet or sorbet.   2 inch square cake, no frosting.   1 tbs honey, sugar, jam, jelly, or syrup.   2 small cookies.   3 squares of graham crackers.   3 cups popcorn.   6 crackers.   1 cup broth-based soup.   Count 1 cup casserole or other mixed foods as 2 carbohydrate servings.   Foods with less than 20 calories in a serving may be counted as 0 carbohydrates or a "free" food.  You may want to purchase a book or computer software that lists the carbohydrate gram counts of different foods. In addition, the nutrition facts panel on the labels of the foods you eat are a good source of this information. The label will tell you how big the serving size is and the total number of carbohydrate grams you will be eating per serving. Divide this number by 15 to obtain the number of carbohydrate servings in a portion. Remember, 1 carbohydrate serving equals 15 grams of carbohydrate.  SERVING SIZES  Measuring foods and serving  sizes helps you make sure you are getting the right amount of food. The list below tells how big or small some common serving sizes are.   1 oz.........4 stacked dice.   3 oz.........Deck of cards.   1 tsp........Tip of little finger.   1 tbs........Thumb.   2 tbs........Golf ball.    cup.......Half of a fist.   1 cup........A fist.  SAMPLE DIABETES MEAL PLAN  Below is a sample meal plan that includes   foods from the grain and starches, dairy, vegetable, fruit, and meat groups. A dietician can individualize a meal plan to fit your calorie needs and tell you the number of servings needed from each food group. However, controlling the total amount of carbohydrates in your meal or snack is more important than making sure you include all of the food groups at every meal. You may interchange carbohydrate containing foods (dairy, starches, and fruits).  The meal plan below is an example of a 2000 calorie diet using carbohydrate counting. This meal plan has 17 carbohydrate servings.  Breakfast   1 cup oatmeal (2 carb servings).    cup light yogurt (1 carb serving).   1 cup blueberries (1 carb serving).    cup almonds.  Snack   1 large apple (2 carb servings).   1 low-fat string cheese stick.  Lunch   Chicken breast salad.   1 cup spinach.    cup chopped tomatoes.   2 oz chicken breast, sliced.   2 tbs low-fat Italian dressing.   12 whole-wheat crackers (2 carb servings).   12 to 15 grapes (1 carb serving).   1 cup low-fat milk (1 carb serving).  Snack   1 cup carrots.    cup hummus (1 carb serving).  Dinner   3 oz broiled salmon.   1 cup brown rice (3 carb servings).  Snack   1  cups steamed broccoli (1 carb serving) drizzled with 1 tsp olive oil and lemon juice.   1 cup light pudding (2 carb servings).  DIABETES MEAL PLANNING WORKSHEET  Your dietician can use this worksheet to help you decide how many servings of foods and what types of foods are right for you.   BREAKFAST  Food Group and  Servings / Carb Servings  Grain/Starches __________________________________  Dairy __________________________________________  Vegetable ______________________________________  Fruit ___________________________________________  Meat __________________________________________  Fat ____________________________________________  LUNCH  Food Group and Servings / Carb Servings  Grain/Starches ___________________________________  Dairy ___________________________________________  Fruit ____________________________________________  Meat ___________________________________________  Fat _____________________________________________  DINNER  Food Group and Servings / Carb Servings  Grain/Starches ___________________________________  Dairy ___________________________________________  Fruit ____________________________________________  Meat ___________________________________________  Fat _____________________________________________  SNACKS  Food Group and Servings / Carb Servings  Grain/Starches ___________________________________  Dairy ___________________________________________  Vegetable _______________________________________  Fruit ____________________________________________  Meat ___________________________________________  Fat _____________________________________________  DAILY TOTALS  Starches _________________________  Vegetable ________________________  Fruit ____________________________  Dairy ____________________________  Meat ____________________________  Fat ______________________________  Document Released: 05/21/2005 Document Revised: 08/13/2011 Document Reviewed: 04/01/2009  [employer] Patient Information 2012 [employer]

## 2011-12-17 NOTE — ED Notes (Signed)
Given gold fish crackers at patient request

## 2011-12-17 NOTE — ED Provider Notes (Signed)
Chief Complaint  Patient presents with  . Hyperglycemia    History of Present Illness:  Maureen Price is a 36 year old female who comes in today because of diabetes. She was first diagnosed as having diabetes 9 years ago and was initially treated with NovoLog injections and some kind of oral medications as well. She lost about 100 pounds and when off all her medications about 6 years ago. She has not been on any medication since then, but has noted over the past one to 2 months her sugar seems to be going up. She did a capillary blood glucose this past Sunday 5 days ago was 298. She has symptoms including polyuria, polydipsia, blurred vision, dizziness, and vaginal itching and she also notes all over body pain, numbness, and tingling over the past week. Her arms and legs feel weak, and she's felt tired and rundown. She's also noted in a sensation of movement in her abdomen for the past 2 weeks which feels like something turning over. She has had a bilateral tubal ligation. Her last menses was February 1. She does note some nausea and some breast tenderness.  Review of Systems:  Other than noted above, the patient denies any of the following symptoms. Systemic:  No fever, chills, fatigue, weight loss or gain. Eye:  No blurred vision or diplopia. Lungs:  No cough, wheezing, or shortness of breath. Heart:  No chest pain, tightness, pressure, palpitation, dizziness, syncope, or edema. Abdomen:  No abdominal pain, nausea, vomiting or diarrrhea. GU:  No dysuria, frequency, urgency, hematuria. Ext:  No pain, paresthesias, swelling, or ulcerations. Endocrine:  No polyuria, polydipsia, heat or cold intolerance. Skin:  No rash or itching. Neuro:  No focal weakness or numbness.   PMFSH:  Past medical history, family history, social history, meds, and allergies were reviewed.  Physical Exam:   Vital signs:  BP 124/80  Pulse 86  Temp(Src) 98.5 F (36.9 C) (Oral)  Resp 14  SpO2 99%  LMP  10/09/2011 Gen:  Alert, oriented, in no distress. Eye:  PERRL, full EOM, lids conjunctivas, and sclera unremarkable. ENT:  TMs and canals normal.  Mucous membranes moist.  No acetone odor.  Pharynx clear.   Neck:  Supple, full ROM, no adenopathy or tenderness.  No JVD. Lungs:  Clear to auscultation.  No wheezes, rales or rhonchi. Heart:  Regular rhythm.  No gallops or murmers. Abdomen:  Soft, flat, non-distended, nontener.  No hepato-splenomegaly or mass.  Bowel sounds normal.  No pulsatile midline mass or bruit. Ext:  No edema, pulses full.  No ulceration or skin lesions. Skin:  Clear, warm and dry.  No rash or lesions. Neuro:  Alert and oriented times 3.  No focal weakness.  Speech normal.  CNs intact.  Labs:   Results for orders placed during the hospital encounter of 12/17/11  POCT URINALYSIS DIP (DEVICE)      Component Value Range   Glucose, UA 500 (*) NEGATIVE (mg/dL)   Bilirubin Urine NEGATIVE  NEGATIVE    Ketones, ur NEGATIVE  NEGATIVE (mg/dL)   Specific Gravity, Urine 1.010  1.005 - 1.030    Hgb urine dipstick NEGATIVE  NEGATIVE    pH 6.0  5.0 - 8.0    Protein, ur NEGATIVE  NEGATIVE (mg/dL)   Urobilinogen, UA 0.2  0.0 - 1.0 (mg/dL)   Nitrite NEGATIVE  NEGATIVE    Leukocytes, UA NEGATIVE  NEGATIVE   POCT PREGNANCY, URINE      Component Value Range   Preg Test, Ur  NEGATIVE  NEGATIVE   POCT I-STAT, CHEM 8      Component Value Range   Sodium 142  135 - 145 (mEq/L)   Potassium 3.8  3.5 - 5.1 (mEq/L)   Chloride 106  96 - 112 (mEq/L)   BUN 4 (*) 6 - 23 (mg/dL)   Creatinine, Ser 7.82  0.50 - 1.10 (mg/dL)   Glucose, Bld 956 (*) 70 - 99 (mg/dL)   Calcium, Ion 2.13  0.86 - 1.32 (mmol/L)   TCO2 25  0 - 100 (mmol/L)   Hemoglobin 14.3  12.0 - 15.0 (g/dL)   HCT 57.8  46.9 - 62.9 (%)    Assessment:   Diagnoses that have been ruled out:  None  Diagnoses that are still under consideration:  None  Final diagnoses:  Diabetes mellitus type 2 in obese    Plan:   1.  The  following meds were prescribed:   New Prescriptions   METFORMIN (GLUCOPHAGE) 500 MG TABLET    Take 1 tablet (500 mg total) by mouth 2 (two) times daily with a meal.   TRAMADOL (ULTRAM) 50 MG TABLET    Take 2 tablets (100 mg total) by mouth every 8 (eight) hours as needed for pain.   2.  The patient was instructed in symptomatic care and handouts were given. 3.  The patient was told to return if becoming worse in any way, if no better in 3 or 4 days, and given some red flag symptoms that would indicate earlier return.  Follow up:  The patient was told to follow up with a primary care doctor as soon as possible. I suggested she come tomorrow to get signed up for [facility]. In the meantime, she was instructed in diet.     Reuben Likes, MD 12/17/11 1323

## 2012-02-08 ENCOUNTER — Emergency Department (HOSPITAL_COMMUNITY)
Admission: EM | Admit: 2012-02-08 | Discharge: 2012-02-08 | Disposition: A | Discharge status: Home / Self Care | Payer: Self-pay | Attending: Emergency Medicine | Admitting: Emergency Medicine

## 2012-02-08 ENCOUNTER — Encounter (HOSPITAL_COMMUNITY): Payer: Self-pay | Admitting: Emergency Medicine

## 2012-02-08 DIAGNOSIS — R109 Unspecified abdominal pain: Secondary | ICD-10-CM | POA: Insufficient documentation

## 2012-02-08 DIAGNOSIS — R110 Nausea: Secondary | ICD-10-CM | POA: Insufficient documentation

## 2012-02-08 DIAGNOSIS — F17200 Nicotine dependence, unspecified, uncomplicated: Secondary | ICD-10-CM | POA: Insufficient documentation

## 2012-02-08 DIAGNOSIS — R197 Diarrhea, unspecified: Secondary | ICD-10-CM | POA: Insufficient documentation

## 2012-02-08 DIAGNOSIS — E119 Type 2 diabetes mellitus without complications: Secondary | ICD-10-CM | POA: Insufficient documentation

## 2012-02-08 LAB — CBC
MCHC: 34.8 g/dL (ref 30.0–36.0)
RDW: 14 % (ref 11.5–15.5)

## 2012-02-08 LAB — URINALYSIS, MICROSCOPIC ONLY
Bilirubin Urine: NEGATIVE
Hgb urine dipstick: NEGATIVE
Specific Gravity, Urine: 1.014 (ref 1.005–1.030)
pH: 6 (ref 5.0–8.0)

## 2012-02-08 LAB — BASIC METABOLIC PANEL
BUN: 6 mg/dL (ref 6–23)
CO2: 24 mEq/L (ref 19–32)
Calcium: 8.7 mg/dL (ref 8.4–10.5)
Creatinine, Ser: 0.42 mg/dL — ABNORMAL LOW (ref 0.50–1.10)
GFR calc non Af Amer: >90 mL/min (ref >90)
Glucose, Bld: 160 mg/dL — ABNORMAL HIGH (ref 70–99)

## 2012-02-08 LAB — GLUCOSE, CAPILLARY: Glucose-Capillary: 150 mg/dL — ABNORMAL HIGH (ref 70–99)

## 2012-02-08 LAB — PREGNANCY, URINE: Preg Test, Ur: NEGATIVE

## 2012-02-08 MED ORDER — HYDROMORPHONE HCL PF 1 MG/ML IJ SOLN
1.0000 mg | Freq: Once | INTRAMUSCULAR | Status: AC
  Administered 2012-02-08: 1 mg via INTRAVENOUS
  Filled 2012-02-08: qty 1

## 2012-02-08 MED ORDER — PROMETHAZINE HCL 25 MG PO TABS: 25.0000 mg | ORAL_TABLET | Freq: Four times a day (QID) | ORAL | Status: DC | PRN

## 2012-02-08 MED ORDER — ONDANSETRON HCL 4 MG/2ML IJ SOLN
4.0000 mg | Freq: Once | INTRAMUSCULAR | Status: AC
  Administered 2012-02-08: 4 mg via INTRAVENOUS
  Filled 2012-02-08: qty 2

## 2012-02-08 MED ORDER — HYDROCODONE-ACETAMINOPHEN 5-325 MG PO TABS: 1.0000 | ORAL_TABLET | ORAL | Status: DC | PRN

## 2012-02-08 MED ORDER — ONDANSETRON 8 MG PO TBDP
8.0000 mg | ORAL_TABLET | Freq: Once | ORAL | Status: AC
  Administered 2012-02-08: 8 mg via ORAL
  Filled 2012-02-08: qty 1

## 2012-02-08 MED ORDER — SODIUM CHLORIDE 0.9 % IV BOLUS (SEPSIS)
1000.0000 mL | Freq: Once | INTRAVENOUS | Status: AC
  Administered 2012-02-08: 1000 mL via INTRAVENOUS

## 2012-02-08 NOTE — ED Notes (Signed)
Pt presenting to ed with c/o abdominal pain x 2 months with positive nausea no vomiting pt states it feels like something is moving in her something pt states she has taken multiple pregnancy test that have been negative. Pt states she went to the urgent care x 1 month ago for the same but it still feels like something is moving and "I want to make sure I don't have a baby in my tubes"

## 2012-02-08 NOTE — ED Provider Notes (Signed)
History     CSN: [id number]  Arrival date & time 02/08/12  1708   First MD Initiated Contact with Patient 02/08/12 2122      Chief Complaint  Patient presents with  . Abdominal Pain  . Nausea     The history is provided by the patient.   the patient reports nausea for approximately a week and a half and she feels like something is moving in her abdomen.  She reports diarrhea without hematochezia over the past several days.  She denies vomiting.  She reports she occasionally feels something moving on the left side of her abdomen.  She has had some mild low back pain.  She has had several negative pregnancy test at home but she is concerned that she might "have a baby in her tubes".  The patient reports no fever or chills.  She denies dysuria urinary frequency.  She has no radiation of her back pain towards her abdomen.  No history of ureteral stones.  She does have a history of tubal ligation and has had a prior cholecystectomy  Past Medical History  Diagnosis Date  . Diabetes mellitus     2002; no meds since 2004    Past Surgical History  Procedure Date  . Cesarean section   . Gallbladder surgery   . Tubal ligation     2000    No family history on file.  History  Substance Use Topics  . Smoking status: Current Everyday Smoker    Types: Cigarettes  . Smokeless tobacco: Not on file  . Alcohol Use: Yes    OB History    Grav Para Term Preterm Abortions TAB SAB Ect Mult Living   4 3 3  1  1   3       Review of Systems  Gastrointestinal: Positive for abdominal pain.  All other systems reviewed and are negative.    Allergies  Erythromycin and Penicillins  Home Medications   Current Outpatient Rx  Name Route Sig Dispense Refill  . DIPHENHYDRAMINE-APAP (SLEEP) 25-500 MG PO TABS Oral Take 1 tablet by mouth at bedtime as needed. For sleep    . METFORMIN HCL 500 MG PO TABS Oral Take 1 tablet (500 mg total) by mouth 2 (two) times daily with a meal. 60 tablet 1  . ADULT  MULTIVITAMIN W/MINERALS CH Oral Take 1 tablet by mouth daily.    . OXYCODONE-ACETAMINOPHEN 10-325 MG PO TABS Oral Take 1 tablet by mouth every 4 (four) hours as needed. Pain    . HYDROCODONE-ACETAMINOPHEN 5-325 MG PO TABS Oral Take 1 tablet by mouth every 4 (four) hours as needed for pain. 12 tablet 0  . PROMETHAZINE HCL 25 MG PO TABS Oral Take 1 tablet (25 mg total) by mouth every 6 (six) hours as needed for nausea. 10 tablet 0  . PROMETHAZINE HCL 25 MG PO TABS Oral Take 1 tablet (25 mg total) by mouth every 6 (six) hours as needed for nausea. 12 tablet 0    BP 129/73  Temp(Src) 98.1 F (36.7 C) (Oral)  Resp 14  SpO2 99%  LMP 12/09/2011  Physical Exam  Nursing note and vitals reviewed. Constitutional: She is oriented to person, place, and time. She appears well-developed and well-nourished. No distress.  HENT:  Head: Normocephalic and atraumatic.  Eyes: EOM are normal.  Neck: Normal range of motion.  Cardiovascular: Normal rate, regular rhythm and normal heart sounds.   Pulmonary/Chest: Effort normal and breath sounds normal.  Abdominal: Soft. She  exhibits no distension. There is no tenderness. There is no rebound and no guarding.  Musculoskeletal: Normal range of motion.  Neurological: She is alert and oriented to person, place, and time.  Skin: Skin is warm and dry.  Psychiatric: She has a normal mood and affect. Judgment normal.    ED Course  Procedures (including critical care time)  Labs Reviewed  URINALYSIS, WITH MICROSCOPIC - Abnormal; Notable for the following:    Glucose, UA 250 (*)    Bacteria, UA FEW (*)    Squamous Epithelial / LPF MANY (*)    All other components within normal limits  GLUCOSE, CAPILLARY - Abnormal; Notable for the following:    Glucose-Capillary 150 (*)    All other components within normal limits  BASIC METABOLIC PANEL - Abnormal; Notable for the following:    Glucose, Bld 160 (*)    Creatinine, Ser 0.42 (*)    All other components within  normal limits  PREGNANCY, URINE  CBC  HCG, SERUM, QUALITATIVE   No results found.   1. Nausea   2. Diarrhea       MDM  The patient's abdomen is benign on exam.  Vital signs are normal.  A urine pregnancy test is negative.  Her labs are normal.  Her urine shows no signs of hematuria or urinary tract infection.  The patient feels better at this time.  She be discharged home with a short course of pain medication and antinausea medicine.  There is no indication for imaging of her abdomen at this time        Lyanne Co, MD 02/08/12 2304

## 2012-02-08 NOTE — ED Notes (Signed)
Pt. With nausea for one week.  Also c/o back pain.  No urinary complaints.

## 2012-02-15 ENCOUNTER — Emergency Department (HOSPITAL_COMMUNITY)
Admission: EM | Admit: 2012-02-15 | Discharge: 2012-02-16 | Disposition: A | Discharge status: Home / Self Care | Payer: Self-pay | Attending: Emergency Medicine | Admitting: Emergency Medicine

## 2012-02-15 ENCOUNTER — Encounter (HOSPITAL_COMMUNITY): Payer: Self-pay | Admitting: Emergency Medicine

## 2012-02-15 DIAGNOSIS — IMO0001 Reserved for inherently not codable concepts without codable children: Secondary | ICD-10-CM | POA: Insufficient documentation

## 2012-02-15 DIAGNOSIS — R109 Unspecified abdominal pain: Secondary | ICD-10-CM | POA: Insufficient documentation

## 2012-02-15 DIAGNOSIS — E1165 Type 2 diabetes mellitus with hyperglycemia: Secondary | ICD-10-CM

## 2012-02-15 LAB — PREGNANCY, URINE: Preg Test, Ur: NEGATIVE

## 2012-02-15 LAB — URINALYSIS, ROUTINE W REFLEX MICROSCOPIC
Bilirubin Urine: NEGATIVE
Glucose, UA: >1000 mg/dL — AB
Hgb urine dipstick: NEGATIVE
Ketones, ur: NEGATIVE mg/dL
Leukocytes, UA: NEGATIVE
Nitrite: NEGATIVE
Protein, ur: NEGATIVE mg/dL
Specific Gravity, Urine: 1.027 (ref 1.005–1.030)
Urobilinogen, UA: 0.2 mg/dL (ref 0.0–1.0)
pH: 5.5 (ref 5.0–8.0)

## 2012-02-15 LAB — CBC
HCT: 41.4 % (ref 36.0–46.0)
Hemoglobin: 15 g/dL (ref 12.0–15.0)
MCH: 27.8 pg (ref 26.0–34.0)
MCHC: 36.2 g/dL — ABNORMAL HIGH (ref 30.0–36.0)
MCV: 76.8 fL — ABNORMAL LOW (ref 78.0–100.0)
Platelets: 321 10*3/uL (ref 150–400)
RBC: 5.39 MIL/uL — ABNORMAL HIGH (ref 3.87–5.11)
RDW: 13.7 % (ref 11.5–15.5)
WBC: 9.5 10*3/uL (ref 4.0–10.5)

## 2012-02-15 LAB — GLUCOSE, CAPILLARY: Glucose-Capillary: 323 mg/dL — ABNORMAL HIGH (ref 70–99)

## 2012-02-15 LAB — BASIC METABOLIC PANEL
BUN: 7 mg/dL (ref 6–23)
CO2: 21 mEq/L (ref 19–32)
Calcium: 9.2 mg/dL (ref 8.4–10.5)
Chloride: 95 mEq/L — ABNORMAL LOW (ref 96–112)
Creatinine, Ser: 0.46 mg/dL — ABNORMAL LOW (ref 0.50–1.10)
GFR calc Af Amer: >90 mL/min (ref >90)
GFR calc non Af Amer: >90 mL/min (ref >90)
Glucose, Bld: 381 mg/dL — ABNORMAL HIGH (ref 70–99)
Potassium: 3.7 mEq/L (ref 3.5–5.1)
Sodium: 130 mEq/L — ABNORMAL LOW (ref 135–145)

## 2012-02-15 LAB — URINE MICROSCOPIC-ADD ON

## 2012-02-15 MED ORDER — METFORMIN HCL 1000 MG PO TABS: 1000.0000 mg | ORAL_TABLET | Freq: Two times a day (BID) | ORAL | Status: DC

## 2012-02-15 MED ORDER — INSULIN REGULAR HUMAN 100 UNIT/ML IJ SOLN: 7.0000 [IU] | Freq: Once | INTRAMUSCULAR | Status: DC

## 2012-02-15 MED ORDER — INSULIN ASPART 100 UNIT/ML ~~LOC~~ SOLN
7.0000 [IU] | Freq: Once | SUBCUTANEOUS | Status: AC
  Administered 2012-02-15: 7 [IU] via INTRAVENOUS
  Filled 2012-02-15: qty 1

## 2012-02-15 MED ORDER — ONDANSETRON HCL 4 MG/2ML IJ SOLN
4.0000 mg | Freq: Once | INTRAMUSCULAR | Status: AC
  Administered 2012-02-15: 4 mg via INTRAVENOUS
  Filled 2012-02-15: qty 2

## 2012-02-15 MED ORDER — SODIUM CHLORIDE 0.9 % IV BOLUS (SEPSIS)
1000.0000 mL | Freq: Once | INTRAVENOUS | Status: AC
  Administered 2012-02-15: 1000 mL via INTRAVENOUS

## 2012-02-15 NOTE — ED Notes (Signed)
Pt's CBG=323.

## 2012-02-15 NOTE — Discharge Instructions (Signed)
Blood Sugar Monitoring, Adult GLUCOSE METERS FOR SELF-MONITORING OF BLOOD GLUCOSE  It is important to be able to correctly measure your blood sugar (glucose). You can use a blood glucose monitor (a small battery-operated device) to check your glucose level at any time. This allows you and your caregiver to monitor your diabetes and to determine how well your treatment plan is working. The process of monitoring your blood glucose with a glucose meter is called self-monitoring of blood glucose (SMBG). When people with diabetes control their blood sugar, they have better health. To test for glucose with a typical glucose meter, place the disposable strip in the meter. Then place a small sample of blood on the "test strip." The test strip is coated with chemicals that combine with glucose in blood. The meter measures how much glucose is present. The meter displays the glucose level as a number. Several new models can record and store a number of test results. Some models can connect to personal computers to store test results or print them out.  Newer meters are often easier to use than older models. Some meters allow you to get blood from places other than your fingertip. Some new models have automatic timing, error codes, signals, or barcode readers to help with proper adjustment (calibration). Some meters have a large display screen or spoken instructions for people with visual impairments.  INSTRUCTIONS FOR USING GLUCOSE METERS  Wash your hands with soap and warm water, or clean the area with alcohol. Dry your hands completely.   Prick the side of your fingertip with a lancet (a sharp-pointed tool used by hand).   Hold the hand down and gently milk the finger until a small drop of blood appears. Catch the blood with the test strip.   Follow the instructions for inserting the test strip and using the SMBG meter. Most meters require the meter to be turned on and the test strip to be inserted before  applying the blood sample.   Record the test result.   Read the instructions carefully for both the meter and the test strips that go with it. Meter instructions are found in the user manual. Keep this manual to help you solve any problems that may arise. Many meters use "error codes" when there is a problem with the meter, the test strip, or the blood sample on the strip. You will need the manual to understand these error codes and fix the problem.   New devices are available such as laser lancets and meters that can test blood taken from "alternative sites" of the body, other than fingertips. However, you should use standard fingertip testing if your glucose changes rapidly. Also, use standard testing if:   You have eaten, exercised, or taken insulin in the past 2 hours.   You think your glucose is low.   You tend to not feel symptoms of low blood glucose (hypoglycemia).   You are ill or under stress.   Clean the meter as directed by the manufacturer.   Test the meter for accuracy as directed by the manufacturer.   Take your meter with you to your caregiver's office. This way, you can test your glucose in front of your caregiver to make sure you are using the meter correctly. Your caregiver can also take a sample of blood to test using a routine lab method. If values on the glucose meter are close to the lab results, you and your caregiver will see that your meter is working well  and you are using good technique. Your caregiver will advise you about what to do if the results do not match.  FREQUENCY OF TESTING  Your caregiver will tell you how often you should check your blood glucose. This will depend on your type of diabetes, your current level of diabetes control, and your types of medicines. The following are general guidelines, but your care plan may be different. Record all your readings and the time of day you took them for review with your caregiver.   Diabetes type 1.   When you  are using insulin with good diabetic control (either multiple daily injections or via a pump), you should check your glucose 4 times a day.   If your diabetes is not well controlled, you may need to monitor more frequently, including before meals and 2 hours after meals, at bedtime, and occasionally between 2 a.m. and 3 a.m.   You should always check your glucose before a dose of insulin or before changing the rate on your insulin pump.   Diabetes type 2.   Guidelines for SMBG in diabetes type 2 are not as well defined.   If you are on insulin, follow the guidelines above.   If you are on medicines, but not insulin, and your glucose is not well controlled, you should test at least twice daily.   If you are not on insulin, and your diabetes is controlled with medicines or diet alone, you should test at least once daily, usually before breakfast.   A weekly profile will help your caregiver advise you on your care plan. The week before your visit, check your glucose before a meal and 2 hours after a meal at least daily. You may want to test before and after a different meal each day so you and your caregiver can tell how well controlled your blood sugars are throughout the course of a 24 hour period.   Gestational diabetes (diabetes during pregnancy).   Frequent testing is often necessary. Accurate timing is important.   If you are not on insulin, check your glucose 4 times a day. Check it before breakfast and 1 hour after the start of each meal.   If you are on insulin, check your glucose 6 times a day. Check it before each meal and 1 hour after the first bite of each meal.   General guidelines.   More frequent testing is required at the start of insulin treatment. Your caregiver will instruct you.   Test your glucose any time you suspect you have low blood sugar (hypoglycemia).   You should test more often when you change medicines, when you have unusual stress or illness, or in other  unusual circumstances.  OTHER THINGS TO KNOW ABOUT GLUCOSE METERS  Measurement Range. Most glucose meters are able to read glucose levels over a broad range of values from as low as 0 to as high as 600 mg/dL. If you get an extremely high or low reading from your meter, you should first confirm it with another reading. Report very high or very low readings to your caregiver.   Whole Blood Glucose versus Plasma Glucose. Some older home glucose meters measure glucose in your whole blood. In a lab or when using some newer home glucose meters, the glucose is measured in your plasma (one component of blood). The difference can be important. It is important for you and your caregiver to know whether your meter gives its results as "whole blood equivalent" or "plasma  equivalent."   Display of High and Low Glucose Values. Part of learning how to operate a meter is understanding what the meter results mean. Know how high and low glucose concentrations are displayed on your meter.   Factors that Affect Glucose Meter Performance. The accuracy of your test results depends on many factors and varies depending on the brand and type of meter. These factors include:   Low red blood cell count (anemia).   Substances in your blood (such as uric acid, vitamin C, and others).   Environmental factors (temperature, humidity, altitude).   Name-brand versus generic test strips.   Calibration. Make sure your meter is set up properly. It is a good idea to do a calibration test with a control solution recommended by the manufacturer of your meter whenever you begin using a fresh bottle of test strips. This will help verify the accuracy of your meter.   Improperly stored, expired, or defective test strips. Keep your strips in a dry place with the lid on.   Soiled meter.   Inadequate blood sample.  NEW TECHNOLOGIES FOR GLUCOSE TESTING Alternative site testing Some glucose meters allow testing blood from alternative  sites. These include the:  Upper arm.   Forearm.   Base of the thumb.   Thigh.  Sampling blood from alternative sites may be desirable. However, it may have some limitations. Blood in the fingertips show changes in glucose levels more quickly than blood in other parts of the body. This means that alternative site test results may be different from fingertip test results, not because of the meter's ability to test accurately, but because the actual glucose concentration can be different.  Continuous Glucose Monitoring Devices to measure your blood glucose continuously are available, and others are in development. These methods can be more expensive than self-monitoring with a glucose meter. However, it is uncertain how effective and reliable these devices are. Your caregiver will advise you if this approach makes sense for you. IF BLOOD SUGARS ARE CONTROLLED, PEOPLE WITH DIABETES REMAIN HEALTHIER.  SMBG is an important part of the treatment plan of patients with diabetes mellitus. Below are reasons for using SMBG:   It confirms that your glucose is at a specific, healthy level.   It detects hypoglycemia and severe hyperglycemia.   It allows you and your caregiver to make adjustments in response to changes in lifestyle for individuals requiring medicine.   It determines the need for starting insulin therapy in temporary diabetes that happens during pregnancy (gestational diabetes).  Document Released: 08/27/2003 Document Revised: 08/13/2011 Document Reviewed: 12/18/2010 [facility] Patient Information 2012 [place], [place].How to Avoid Diabetes Problems You can do a lot to prevent or slow down diabetes problems. Following your diabetes plan and taking care of yourself can reduce your risk of serious or life-threatening complications. Below, you will find certain things you can do to prevent diabetes problems. MANAGE YOUR DIABETES Follow your caregiver's, nurse educator's, and dietician's  instructions for managing your diabetes. They will teach you the basics of diabetes care. They can help answer questions you may have. Learn about diabetes and make healthy choices regarding eating and physical activity. Monitor your blood glucose level regularly. Your caregiver will help you decide how often to check your blood glucose level depending on your treatment goals and how well you are meeting them.  DO NOT SMOKE Smoking and diabetes are a dangerous combination. Smoking raises your risk for diabetes problems. If you quit smoking, you will lower your risk for  heart attack, stroke, nerve disease, and kidney disease. Your cholesterol and your blood pressure levels may improve. Your blood circulation will also improve. If you smoke, ask your caregiver for help in quitting. KEEP YOUR BLOOD PRESSURE UNDER CONTROL Keeping your blood pressure under control will help prevent damage to your eyes, kidneys, heart, and blood vessels. Blood pressure consists of 2 numbers. The top number should be below 130, and the bottom number should be below 80 (130/80). Keep your blood pressure as close to these numbers as you can. If you already have kidney disease, you may want even lower blood pressure to protect your kidneys. Talk to your caregiver to make sure that your blood pressure goal is right for your needs. Meal planning, medicines, and exercise can help you reach your blood pressure target. Have your blood pressure checked at every visit with your caregiver. KEEP YOUR CHOLESTEROL UNDER CONTROL Normal cholesterol levels will help prevent heart disease and stroke. These are the biggest health problems for people with diabetes. Keeping cholesterol levels under control can also help with blood flow. Have your cholesterol level checked at least once a year. Meal planning, exercise, and medicines can help you reach your cholesterol targets. SCHEDULE AND KEEP YOUR ANNUAL PHYSICAL EXAMS AND EYE EXAMS Your caregiver  will tell you how often he or she wants to see you depending on your plan of treatment. It is important that you keep these appointments so that possible problems can be identified early and complications can be avoided or treated.  Every visit with your caregiver should include your weight, blood pressure, and an evaluation of your blood glucose control.   Your hemoglobin A1c should be checked:   At least twice a year if you are at your goal.   Every 3 months if there are changes in treatment.   If you are not meeting your goals.   Your blood lipids should be checked yearly. You should also be checked yearly to see if you have protein in your urine (microalbumin).   Schedule a dilated eye exam if you have type 1 diabetes within 5 years of your diagnosis and then yearly. Schedule a dilated eye exam if you have type 2 diabetes at diagnosis and then yearly. All exams thereafter can be extended to every 2 to 3 years if one or more exams have been normal.  KEEP YOUR VACCINES CURRENT The flu vaccine is recommended yearly. The formula for the vaccine changes every year and needs to be updated for the best protection against current viruses. In addition, you should get a vaccination against pneumonia at least once in your life. However, there are some instances where another vaccine is recommended. Check with your caregiver. TAKE CARE OF YOUR FEET  Diabetes may cause you to have a poor blood supply (circulation) to your legs and feet. Because of this, the skin may be thinner, break easier, and heal more slowly. You also may have nerve damage in your legs and feet causing decreased feeling. You may not notice minor injuries to your feet that could lead to serious problems or infections. Taking care of your feet is very important. Visual foot exams are performed at every routine medical visit. The exams check for cuts, injuries, or other problems with the feet. A comprehensive foot exam should be done  yearly. This includes visual inspection as well as assessing foot pulses and testing for loss of sensation. You should also do the following:  Inspect your feet daily for cuts,  calluses, blisters, ingrown toenails, and signs of infection, such as redness, swelling, or pus.   Wash and dry your feet thoroughly, especially between the toes.   Avoid soaking your feet regularly in hot water baths.   Moisturize dry skin with lotion, avoiding areas between your toes.   Cut toenails straight across and file the edges.   Avoid shoes that do not fit well or have areas that irritate your skin.   Avoid going barefooted or wearing only socks. Your feet need protection.  TAKE CARE OF YOUR TEETH People with poorly controlled diabetes are more likely to have gum (periodontal) disease. These infections make diabetes harder to control. Periodontal diseases, if left untreated, can lead to tooth loss. Brush your teeth twice a day, floss, and see your dentist for checkups and cleaning every 6 months, or 2 times a year. ASK YOUR CAREGIVER ABOUT TAKING ASPIRIN Taking aspirin daily is recommended to help prevent cardiovascular disease in people with and without diabetes. Ask your caregiver if this would benefit you and what dose he or she would recommend. DRINK RESPONSIBLY Moderate amounts of alcohol (less than 1 drink per day for adult women and less than 2 drinks per day for adult men) have a minimal effect on blood glucose if ingested with food. It is important to eat food with alcohol to avoid hypoglycemia. People should avoid alcohol if they have a history of alcohol abuse or dependence, if they are pregnant, and if they have liver disease, pancreatitis, advanced neuropathy, or severe hypertriglyceridemia. LESSEN STRESS Living with diabetes can be stressful. When you are under stress, your blood glucose may be affected in two ways:  Stress hormones may cause your blood glucose to rise.   You may be distracted  from taking good care of yourself.  It is a good idea to be aware of your stress level and make changes that are necessary to help you better manage challenging situations. Support groups, planned relaxation, a hobby you enjoy, meditation, healthy relationships, and exercise all work to lower your stress level. If your efforts do not seem to be helping, get help from your caregiver or a trained mental health professional. Document Released: 05/12/2011 Document Revised: 08/13/2011 Document Reviewed: 05/12/2011 [facility] Patient Information 2012 [place], [place].Diabetes, Frequently Asked Questions WHAT IS DIABETES? Most of the food we eat is turned into glucose (sugar). Our bodies use it for energy. The pancreas makes a hormone called insulin. It helps glucose get into the cells of our bodies. When you have diabetes, your body either does not make enough insulin or cannot use its own insulin as well as it should. This causes sugars to build up in your blood. WHAT ARE THE SYMPTOMS OF DIABETES?  Frequent urination.   Excessive thirst.   Unexplained weight loss.   Extreme hunger.   Blurred vision.   Tingling or numbness in hands or feet.   Feeling very tired much of the time.   Dry, itchy skin.   Sores that are slow to heal.   Yeast infections.  WHAT ARE THE TYPES OF DIABETES? Type 1 Diabetes   About 10% of affected people have this type.   Usually occurs before the age of 66.   Usually occurs in thin to normal weight people.  Type 2 Diabetes  About 90% of affected people have this type.   Usually occurs after the age of 43.   Usually occurs in overweight people.   More likely to have:   A family history  of diabetes.   A history of diabetes during pregnancy (gestational diabetes).   High blood pressure.   High cholesterol and triglycerides.  Gestational Diabetes  Occurs in about 4% of pregnancies.   Usually goes away after the baby is born.   More likely to occur  in women with:   Family history of diabetes.   Previous gestational diabetes.   Obese.   Over 10 years old.  WHAT IS PRE-DIABETES? Pre-diabetes means your blood glucose is higher than normal, but lower than the diabetes range. It also means you are at risk of getting type 2 diabetes and heart disease. If you are told you have pre-diabetes, have your blood glucose checked again in 1 to 2 years. WHAT IS THE TREATMENT FOR DIABETES? Treatment is aimed at keeping blood glucose near normal levels at all times. Learning how to manage this yourself is important in treating diabetes. Depending on the type of diabetes you have, your treatment will include one or more of the following:  Monitoring your blood glucose.   Meal planning.   Exercise.   Oral medicine (pills) or insulin.  CAN DIABETES BE PREVENTED? With type 1 diabetes, prevention is more difficult, because the triggers that cause it are not yet known. With type 2 diabetes, prevention is more likely, with lifestyle changes:  Maintain a healthy weight.   Eat healthy.   Exercise.  IS THERE A CURE FOR DIABETES? No, there is no cure for diabetes. There is a lot of research going on that is looking for a cure, and progress is being made. Diabetes can be treated and controlled. People with diabetes can manage their diabetes and lead normal, active lives. SHOULD I BE TESTED FOR DIABETES? If you are at least 36 years old, you should be tested for diabetes. You should be tested again every 3 years. If you are 45 or older and overweight, you may want to get tested more often. If you are younger than 45, overweight, and have one or more of the following risk factors, you should be tested:  Family history of diabetes.   Inactive lifestyle.   High blood pressure.  WHAT ARE SOME OTHER SOURCES FOR INFORMATION ON DIABETES? The following organizations may help in your search for more information on diabetes: National Diabetes Education  Program (NDEP) Internet: SolarDiscussions.es American Diabetes Association Internet: http://www.diabetes.org  Juvenile Diabetes Foundation International Internet: WetlessWash.is Document Released: 08/27/2003 Document Revised: 08/13/2011 Document Reviewed: 06/21/2009 [facility] Patient Information 2012 [place], [place].  RESOURCE GUIDE  Chronic Pain Problems: Contact [facility]  [phone] Patients need to be referred by their primary care doctor.  Insufficient Money for Medicine: Contact [employer]:  call "211" or [facility] [phone].  No Primary Care Doctor: - Call [facility]  [phone] - can help you locate a primary care doctor that  accepts your insurance, provides certain services, etc. - Physician Referral [phone]  Agencies that provide inexpensive medical care: - [facility]  [phone] - [facility]  [phone] - [facility]  [phone] [facility]  [phone] - [facility]  [phone] [facility]  [phone]  Medicaid-accepting [facility] Providers: - [facility]- [address]  [phone], Mon-Fri 9am-7pm, Sat 9am-1pm - [facility]- [address] [place], [address] [place]  [phone] - [facility]- [address], [address] [place]  [phone] - [facility]- [address]  [phone] - Renaye Rakers- [address] [place], [address], [phone]  Only accepts [place] patients after they have their name  applied to their card  Self Pay (no insurance) in [facility]: - Sickle Cell Patients: Dr Willey Blade, [facility]  [address] [place], [phone] - [facility]- [address] [place]  [phone]       [facility] [place]- [address]       -     [facility]- see information  above (Speak to [employer] if you do not have insurance)       -  [facility]- [address] [place], [phone]       -  [facility]- [address],  [phone]       -  [facility]- [address] [phone]       -  Dr Julio Sicks-  [address], [place], [phone]       -  [facility]- [address] [phone]       -  [facility]- [address], [phone], also [address] [phone]       -    [facility]- [address] [place], [phone], 1st & 3rd Saturday   every month, 10am-1pm  1) Find a Doctor and Pay Out of Pocket Although you won't have to find out who is covered by your insurance plan, it is a good idea to ask around and get recommendations. You will then need to call the office and see if the doctor you have chosen will accept you as a new patient and what types of options they offer for patients who are self-pay. Some doctors offer discounts or will set up payment plans for their patients who do not have insurance, but you will need to ask so you aren't surprised when you get to your appointment.  2) Contact Your Local Health Department Not all health departments have doctors that can see patients for sick visits, but many do, so it is worth a call to see if yours does. If you don't know where your local health department is, you can check in your phone book. The CDC also has a tool to help you locate your state's health department, and many state websites also have listings of all of their local health departments.  3) Find a Walk-in Clinic If your illness is not likely to be very severe or complicated, you may want to try a walk in clinic. These are popping up all over the country in pharmacies, drugstores, and shopping centers. They're usually staffed by nurse practitioners or physician assistants that have been trained to treat common illnesses and complaints. They're usually fairly quick and  inexpensive. However, if you have serious medical issues or chronic medical problems, these are probably not your best option  STD Testing - [facility] Department of [facility], STD Clinic, [address], [place], phone [phone] or [phone].  Monday - Friday, call for an appointment. [facility] Department of [employer], STD Clinic, [address], [place], phone [phone] or [phone].  Monday - Friday, call for an appointment.  Abuse/Neglect: [facility] Child Abuse Hotline (623) 542-1838 [facility] Child Abuse Hotline (867) 671-2308 (After Hours)  Emergency Shelter:  [facility] [phone]  Maternity Homes: - [facility] (415)159-6622 - [facility] [phone]  MRSA Hotline #:   (651) 385-1148  [facility] Resources  [facility]  [employer] [facility] Dept. [address]                 [address]         [address] [place] [address]  Blondell Reveal Phone:  [phone]                                  Phone:  [phone]                   Phone:  [phone]  [facility], [phone] - [facility] - [phone]       -     [facility] in [place], [address]                                  [phone], [facility] Child Abuse Hotline 513-705-3296 or 856-424-9358 (After Hours)   Behavioral Health Services  Substance Abuse Resources: - [facility]  980-878-3094 - [facility] (325) 179-1624 - [facility] ([phone] Floydene Flock (743)244-0550 - Residential & Outpatient Substance Abuse Program  [phone]  Psychological Services: [facility]  ([phone]  Services  [phone] - [facility], [phone] [place]. [address], [place], ACCESS LINE: 405-011-4848 or 5804025276, [email or web address]  Dental Assistance  If unable to pay or uninsured, contact:  [facility] or [facility]. to become qualified for the adult dental clinic.  Patients with Medicaid: [facility] [phone] W[address] Quails, [phone] [address], [phone]  If unable to pay, or uninsured, contact [facility] [phone]) or [facility] Department [phone] in [place], [phone] in [facility]) to become qualified for the adult dental clinic  Other Low-Cost Community Dental Services: - [facility]- [address] [place], [place], [place], [address], [phone], Ext. [phone], 2nd and 4th Thursday of the month at 6:30am.  10 clients each day by appointment, can sometimes see walk-in patients if someone does not show for an appointment. [facility]- [address] Ether Griffins [place], [place], [address], [phone] - [facility]- [address] [place], [place], [address], [phone] - [facility]- [phone] [facility]- [phone] [facility] Department- [phone]

## 2012-02-15 NOTE — ED Notes (Signed)
Pt reports that her CBG at work today was 600.  Pt reports that she has been having abdominal pain with nausea for one week now. Pt was seen here last Monday for abdominal pain---states her stomach was "jumping and turning around"--- was prescribed Phenergan and Hydrocodone but it did not help, states "all it did was put me to sleep but the pain and nausea continues".

## 2012-02-15 NOTE — ED Notes (Signed)
Pt presents to ED with c/o high blood sugar of 600 which was taken while at work--- pt also reports that she has been having abdominal pain and nausea for 1 week now.

## 2012-02-15 NOTE — ED Provider Notes (Addendum)
History    36 year old female presenting with hyperglycemia. Patient took her blood sugar or and was noted to be over 600. Patient has been having intermittent nausea and abdominal pain for last week or so. Her stomach feels like is "jumping around." Patient is on metformin which she reports compliance with. She has no urinary complaints. No vomiting or diarrhea. Surgical history significant for cesarean section and cholecystectomy. No recent procedures. No unusual vaginal bleeding or discharge. Sick contacts.    CSN: [id number]  Arrival date & time 02/15/12  2107   First MD Initiated Contact with Patient 02/15/12 2136      Chief Complaint  Patient presents with  . Hyperglycemia  . Abdominal Pain    (Consider location/radiation/quality/duration/timing/severity/associated sxs/prior treatment) HPI  Past Medical History  Diagnosis Date  . Diabetes mellitus     2002; no meds since 2004    Past Surgical History  Procedure Date  . Cesarean section   . Gallbladder surgery   . Tubal ligation     2000    History reviewed. No pertinent family history.  History  Substance Use Topics  . Smoking status: Current Everyday Smoker    Types: Cigarettes  . Smokeless tobacco: Not on file  . Alcohol Use: Yes    OB History    Grav Para Term Preterm Abortions TAB SAB Ect Mult Living   4 3 3  1  1   3       Review of Systems   Review of symptoms negative unless otherwise noted in HPI.   Allergies  Erythromycin and Penicillins  Home Medications   Current Outpatient Rx  Name Route Sig Dispense Refill  . METFORMIN HCL 500 MG PO TABS Oral Take 1 tablet (500 mg total) by mouth 2 (two) times daily with a meal. 60 tablet 1  . ADULT MULTIVITAMIN W/MINERALS CH Oral Take 1 tablet by mouth daily.    Marland Kitchen PROMETHAZINE HCL 25 MG PO TABS Oral Take 1 tablet (25 mg total) by mouth every 6 (six) hours as needed for nausea. 12 tablet 0  . METFORMIN HCL 1000 MG PO TABS Oral Take 1 tablet (1,000 mg  total) by mouth 2 (two) times daily. 30 tablet 1    BP 125/78  Pulse 109  Temp(Src) 98.7 F (37.1 C) (Oral)  Resp 18  SpO2 100%  LMP 12/09/2011  Physical Exam  Nursing note and vitals reviewed. Constitutional: No distress.       Laying in bed. No acute distress. Obese.  HENT:  Head: Normocephalic and atraumatic.  Eyes: Conjunctivae are normal. Right eye exhibits no discharge. Left eye exhibits no discharge.  Neck: Neck supple.  Cardiovascular: Normal rate, regular rhythm and normal heart sounds.  Exam reveals no gallop and no friction rub.   No murmur heard. Pulmonary/Chest: Effort normal and breath sounds normal. No respiratory distress.  Abdominal: Soft. She exhibits no distension. There is no tenderness.       Abdomen is nondistended. Well-healed surgical scars. No tenderness. No mass palpated.  Genitourinary:       No costovertebral angle tenderness  Musculoskeletal: She exhibits no edema and no tenderness.  Neurological: She is alert.  Skin: Skin is warm and dry. She is not diaphoretic.  Psychiatric: She has a normal mood and affect. Her behavior is normal. Thought content normal.    ED Course  Procedures (including critical care time)  Labs Reviewed  GLUCOSE, CAPILLARY - Abnormal; Notable for the following:    Glucose-Capillary 398 (*)  All other components within normal limits  CBC - Abnormal; Notable for the following:    RBC 5.39 (*)    MCV 76.8 (*)    MCHC 36.2 (*)    All other components within normal limits  BASIC METABOLIC PANEL - Abnormal; Notable for the following:    Sodium 130 (*)    Chloride 95 (*)    Glucose, Bld 381 (*)    Creatinine, Ser 0.46 (*)    All other components within normal limits  URINALYSIS, ROUTINE W REFLEX MICROSCOPIC - Abnormal; Notable for the following:    Glucose, UA >1000 (*)    All other components within normal limits  GLUCOSE, CAPILLARY - Abnormal; Notable for the following:    Glucose-Capillary 323 (*)    All other  components within normal limits  PREGNANCY, URINE  URINE MICROSCOPIC-ADD ON   No results found.   1. Uncontrolled diabetes mellitus       MDM  36 year old female with poorly controlled diabetes. Hyperglycemic but no evidence of diabetic ketoacidosis. Patient is currently on metformin. Renal function is normal. Patient instructed to increase her metformin from 500 mg to 1000 mg twice daily daily given her persistently high blood sugars. She is instructed that she needs to followup with her prescribing Dr. As soon as possible to discuss this further. Return precautions were discussed. Outpatient followup otherwise.        Raeford Razor, MD 02/18/12 2358  Raeford Razor, MD 02/18/12 [phone]

## 2012-03-28 ENCOUNTER — Encounter (HOSPITAL_COMMUNITY): Payer: Self-pay | Admitting: *Deleted

## 2012-03-28 DIAGNOSIS — R109 Unspecified abdominal pain: Secondary | ICD-10-CM | POA: Insufficient documentation

## 2012-03-28 DIAGNOSIS — R112 Nausea with vomiting, unspecified: Secondary | ICD-10-CM | POA: Insufficient documentation

## 2012-03-28 DIAGNOSIS — E119 Type 2 diabetes mellitus without complications: Secondary | ICD-10-CM | POA: Insufficient documentation

## 2012-03-28 DIAGNOSIS — F17200 Nicotine dependence, unspecified, uncomplicated: Secondary | ICD-10-CM | POA: Insufficient documentation

## 2012-03-28 LAB — URINALYSIS, ROUTINE W REFLEX MICROSCOPIC
Glucose, UA: 250 mg/dL — AB
Hgb urine dipstick: NEGATIVE
Ketones, ur: NEGATIVE mg/dL
Protein, ur: NEGATIVE mg/dL

## 2012-03-28 NOTE — ED Notes (Signed)
The pt has had abd pain for 2 weeks  With n and v.  She had one preg test  Pos and one neg.  lmp 2 months

## 2012-03-29 ENCOUNTER — Emergency Department (HOSPITAL_COMMUNITY)
Admission: EM | Admit: 2012-03-29 | Discharge: 2012-03-29 | Disposition: A | Discharge status: Home / Self Care | Payer: Self-pay | Attending: Emergency Medicine | Admitting: Emergency Medicine

## 2012-03-29 ENCOUNTER — Emergency Department (HOSPITAL_COMMUNITY): Payer: Self-pay

## 2012-03-29 ENCOUNTER — Encounter (HOSPITAL_COMMUNITY): Payer: Self-pay | Admitting: *Deleted

## 2012-03-29 DIAGNOSIS — F17200 Nicotine dependence, unspecified, uncomplicated: Secondary | ICD-10-CM | POA: Insufficient documentation

## 2012-03-29 DIAGNOSIS — R1011 Right upper quadrant pain: Secondary | ICD-10-CM | POA: Insufficient documentation

## 2012-03-29 DIAGNOSIS — R42 Dizziness and giddiness: Secondary | ICD-10-CM | POA: Insufficient documentation

## 2012-03-29 DIAGNOSIS — R112 Nausea with vomiting, unspecified: Secondary | ICD-10-CM

## 2012-03-29 DIAGNOSIS — R1013 Epigastric pain: Secondary | ICD-10-CM | POA: Insufficient documentation

## 2012-03-29 DIAGNOSIS — R739 Hyperglycemia, unspecified: Secondary | ICD-10-CM

## 2012-03-29 DIAGNOSIS — Z79899 Other long term (current) drug therapy: Secondary | ICD-10-CM | POA: Insufficient documentation

## 2012-03-29 DIAGNOSIS — E119 Type 2 diabetes mellitus without complications: Secondary | ICD-10-CM | POA: Insufficient documentation

## 2012-03-29 DIAGNOSIS — R1033 Periumbilical pain: Secondary | ICD-10-CM

## 2012-03-29 LAB — CBC WITH DIFFERENTIAL/PLATELET
Basophils Absolute: 0 10*3/uL (ref 0.0–0.1)
Basophils Relative: 0 % (ref 0–1)
Basophils Relative: 0 % (ref 0–1)
Eosinophils Absolute: 0.1 10*3/uL (ref 0.0–0.7)
Eosinophils Absolute: 0.1 K/uL (ref 0.0–0.7)
Eosinophils Relative: 1 % (ref 0–5)
HCT: 38.5 % (ref 36.0–46.0)
HCT: 40.1 % (ref 36.0–46.0)
Hemoglobin: 13.8 g/dL (ref 12.0–15.0)
Hemoglobin: 14.4 g/dL (ref 12.0–15.0)
Lymphocytes Relative: 32 % (ref 12–46)
Lymphs Abs: 2.7 K/uL (ref 0.7–4.0)
MCH: 28 pg (ref 26.0–34.0)
MCH: 28 pg (ref 26.0–34.0)
MCHC: 35.8 g/dL (ref 30.0–36.0)
MCHC: 35.9 g/dL (ref 30.0–36.0)
MCV: 78 fL (ref 78.0–100.0)
Monocytes Absolute: 0.5 10*3/uL (ref 0.1–1.0)
Monocytes Absolute: 0.6 10*3/uL (ref 0.1–1.0)
Monocytes Relative: 6 % (ref 3–12)
Monocytes Relative: 7 % (ref 3–12)
Neutro Abs: 5.1 10*3/uL (ref 1.7–7.7)
Neutrophils Relative %: 59 % (ref 43–77)
Platelets: 312 K/uL (ref 150–400)
RBC: 5.14 MIL/uL — ABNORMAL HIGH (ref 3.87–5.11)
RDW: 13.6 % (ref 11.5–15.5)
WBC: 8.5 10*3/uL (ref 4.0–10.5)

## 2012-03-29 LAB — URINALYSIS, ROUTINE W REFLEX MICROSCOPIC
Bilirubin Urine: NEGATIVE
Glucose, UA: >1000 mg/dL — AB
Hgb urine dipstick: NEGATIVE
Ketones, ur: NEGATIVE mg/dL
Leukocytes, UA: NEGATIVE
Nitrite: NEGATIVE
Protein, ur: NEGATIVE mg/dL
Specific Gravity, Urine: 1.018 (ref 1.005–1.030)
Urobilinogen, UA: 1 mg/dL (ref 0.0–1.0)
pH: 6 (ref 5.0–8.0)

## 2012-03-29 LAB — POCT I-STAT, CHEM 8
Creatinine, Ser: 0.6 mg/dL (ref 0.50–1.10)
HCT: 43 % (ref 36.0–46.0)
Hemoglobin: 14.6 g/dL (ref 12.0–15.0)
Potassium: 3.7 mEq/L (ref 3.5–5.1)
Sodium: 140 mEq/L (ref 135–145)

## 2012-03-29 LAB — COMPREHENSIVE METABOLIC PANEL
ALT: 22 U/L (ref 0–35)
AST: 21 U/L (ref 0–37)
Albumin: 3.4 g/dL — ABNORMAL LOW (ref 3.5–5.2)
Albumin: 3.4 g/dL — ABNORMAL LOW (ref 3.5–5.2)
BUN: 6 mg/dL (ref 6–23)
CO2: 25 mEq/L (ref 19–32)
Calcium: 9.2 mg/dL (ref 8.4–10.5)
Creatinine, Ser: 0.56 mg/dL (ref 0.50–1.10)
Sodium: 140 mEq/L (ref 135–145)
Total Bilirubin: 0.2 mg/dL — ABNORMAL LOW (ref 0.3–1.2)
Total Protein: 7.2 g/dL (ref 6.0–8.3)
Total Protein: 7.3 g/dL (ref 6.0–8.3)

## 2012-03-29 LAB — URINE MICROSCOPIC-ADD ON

## 2012-03-29 LAB — POCT PREGNANCY, URINE: Preg Test, Ur: NEGATIVE

## 2012-03-29 MED ORDER — GI COCKTAIL ~~LOC~~
30.0000 mL | Freq: Once | ORAL | Status: AC
  Administered 2012-03-29: 30 mL via ORAL
  Filled 2012-03-29: qty 30

## 2012-03-29 MED ORDER — PANTOPRAZOLE SODIUM 20 MG PO TBEC: 20.0000 mg | DELAYED_RELEASE_TABLET | Freq: Every day | ORAL | Status: DC

## 2012-03-29 MED ORDER — KETOROLAC TROMETHAMINE 30 MG/ML IJ SOLN
30.0000 mg | Freq: Once | INTRAMUSCULAR | Status: AC
  Administered 2012-03-29: 30 mg via INTRAVENOUS
  Filled 2012-03-29: qty 1

## 2012-03-29 MED ORDER — ONDANSETRON HCL 4 MG/2ML IJ SOLN
4.0000 mg | Freq: Once | INTRAMUSCULAR | Status: AC
  Administered 2012-03-29: 4 mg via INTRAVENOUS
  Filled 2012-03-29: qty 2

## 2012-03-29 MED ORDER — PROMETHAZINE HCL 25 MG PO TABS: 25.0000 mg | ORAL_TABLET | Freq: Three times a day (TID) | ORAL | Status: DC | PRN

## 2012-03-29 NOTE — ED Notes (Signed)
Pt came in with abdominal pain last nite from mid upper abdomen down to lower abdomen and states breaks out into sweats.  Pt states she needs an MRI to see what is going on.  LMP 2 weeks ago and it was abnormal.

## 2012-03-29 NOTE — ED Notes (Signed)
Patient ambulatory out of department. Asked patient if she would like her Rx and discharge instructions. Stated that she "was tired and that yall' were taking too long." Patient returned to room. Apologized to the patient and formal discharge was completed.

## 2012-03-29 NOTE — ED Notes (Signed)
PA at bedside.

## 2012-03-29 NOTE — ED Provider Notes (Signed)
Medical screening examination/treatment/procedure(s) were performed by non-physician practitioner and as supervising physician I was immediately available for consultation/collaboration.   Loren Racer, MD 03/29/12 [phone]

## 2012-03-29 NOTE — ED Provider Notes (Signed)
History     CSN: [id number]  Arrival date & time 03/28/12  2321   First MD Initiated Contact with Patient 03/29/12 [phone]      Chief Complaint  Patient presents with  . Abdominal Pain    (Consider location/radiation/quality/duration/timing/severity/associated sxs/prior treatment) HPI Comments: Patient with a history of diabetes presents emergency department with chief complaint of abdominal pain.  Onset of symptoms began approximately 2 weeks ago and is associated with nausea and vomiting.  Patient states the location of pain is periumbilical and does not radiate.  Patient has had a cholecystectomy denies any other surgeries.  She reports that she currently is not taking birth control because her and her husband are trying to get pregnant.  Last menstrual period was July 5 however it only lasted 3 days and her menstruation is normally one week long period patient states that she took a pregnancy test on the first that was negative and then turned positive a couple hours later.  Patient denies any diarrhea and her last normal bowel movement was today in the emergency department.  She has no other complaints at this time denies fever, night sweats, chills, chest pain, shortness of breath, melena, hematochezia, hematuria.  The history is provided by the patient.    Past Medical History  Diagnosis Date  . Diabetes mellitus     2002; no meds since 2004    Past Surgical History  Procedure Date  . Cesarean section   . Gallbladder surgery   . Tubal ligation     2000    No family history on file.  History  Substance Use Topics  . Smoking status: Current Everyday Smoker    Types: Cigarettes  . Smokeless tobacco: Not on file  . Alcohol Use: Yes    OB History    Grav Para Term Preterm Abortions TAB SAB Ect Mult Living   4 3 3  1  1   3       Review of Systems  All other systems reviewed and are negative.    Allergies  Erythromycin and Penicillins  Home Medications    Current Outpatient Rx  Name Route Sig Dispense Refill  . VITAMIN B-12 PO Oral Take 1 tablet by mouth daily.    Marland Kitchen METFORMIN HCL 1000 MG PO TABS Oral Take 1 tablet (1,000 mg total) by mouth 2 (two) times daily. 30 tablet 1  . ADULT MULTIVITAMIN W/MINERALS CH Oral Take 1 tablet by mouth daily.      BP 113/79  Pulse 99  Temp 98.5 F (36.9 C) (Oral)  Resp 19  SpO2 98%  LMP 02/27/2012  Physical Exam  Nursing note and vitals reviewed. Constitutional: Vital signs are normal. She appears well-developed and well-nourished. No distress.  HENT:  Head: Normocephalic and atraumatic.  Mouth/Throat: Uvula is midline, oropharynx is clear and moist and mucous membranes are normal.  Eyes: Conjunctivae and EOM are normal. Pupils are equal, round, and reactive to light.  Neck: Normal range of motion and full passive range of motion without pain. Neck supple. No spinous process tenderness and no muscular tenderness present. No rigidity. No Brudzinski's sign noted.  Cardiovascular: Normal rate and regular rhythm.   Pulmonary/Chest: Effort normal and breath sounds normal. No accessory muscle usage. Not tachypneic. No respiratory distress.  Abdominal: Soft. Normal appearance. She exhibits no distension, no ascites, no pulsatile midline mass and no mass. There is tenderness. There is no CVA tenderness. No hernia.       Soft obese abdomen  with tenderness to palpation in the periumbilical region.  Negative roving sign, no tenderness at McBurney point  Lymphadenopathy:    She has no cervical adenopathy.  Neurological: She is alert.  Skin: Skin is warm and dry. No rash noted. She is not diaphoretic.  Psychiatric: She has a normal mood and affect. Her speech is normal and behavior is normal.    ED Course  Procedures (including critical care time)  Labs Reviewed  URINALYSIS, ROUTINE W REFLEX MICROSCOPIC - Abnormal; Notable for the following:    Glucose, UA 250 (*)     All other components within normal  limits  COMPREHENSIVE METABOLIC PANEL - Abnormal; Notable for the following:    Glucose, Bld 236 (*)     Albumin 3.4 (*)     Total Bilirubin 0.2 (*)     All other components within normal limits  PREGNANCY, URINE  CBC WITH DIFFERENTIAL   No results found.   No diagnosis found.    MDM  Abdominal pain nausea vomiting Hyperglycemia  Patient states she did not when imaging at this time because she is trying to get pregnant.  She also requests nausea medication.Vitals are stable, no fever.  No signs of dehydration, tolerating PO fluids > 6 oz.  Lungs are clear.  No focal abdominal pain, no concern for appendicitis, cholecystitis, pancreatitis, ruptured viscus, UTI, kidney stone, or any other abdominal etiology.  Supportive therapy indicated with return if symptoms worsen.  Patient counseled.         Jaci Carrel, PA-C 03/29/12 0400

## 2012-03-29 NOTE — ED Provider Notes (Signed)
History     CSN: [id number]  Arrival date & time 03/29/12  1732   First MD Initiated Contact with Patient 03/29/12 1847      Chief Complaint  Patient presents with  . Abdominal Pain  . Dizziness    (Consider location/radiation/quality/duration/timing/severity/associated sxs/prior treatment) Patient is a 36 y.o. female presenting with abdominal pain. The history is provided by the patient.  Abdominal Pain The primary symptoms of the illness include abdominal pain and nausea. The primary symptoms of the illness do not include fever, fatigue, shortness of breath, vomiting, diarrhea, hematemesis, hematochezia, dysuria or vaginal discharge. Episode onset: ongoing for about one month. The onset of the illness was gradual. The problem has not changed since onset. The abdominal pain is located in the epigastric region. The abdominal pain radiates to the periumbilical region. The abdominal pain is relieved by being still. Exacerbated by: nothing.  Risk factors for an acute abdominal problem include a history of abdominal surgery. Symptoms associated with the illness do not include chills, diaphoresis, hematuria or back pain. Significant associated medical issues include gallstones.    Past Medical History  Diagnosis Date  . Diabetes mellitus     2002; no meds since 2004    Past Surgical History  Procedure Date  . Cesarean section   . Gallbladder surgery   . Tubal ligation     2000    History reviewed. No pertinent family history.  History  Substance Use Topics  . Smoking status: Current Everyday Smoker    Types: Cigarettes  . Smokeless tobacco: Not on file  . Alcohol Use: Yes    OB History    Grav Para Term Preterm Abortions TAB SAB Ect Mult Living   4 3 3  1  1   3       Review of Systems  Constitutional: Negative for fever, chills, diaphoresis and fatigue.  HENT: Negative for ear pain, congestion, sore throat, facial swelling, mouth sores, trouble swallowing, neck pain  and neck stiffness.   Eyes: Negative.   Respiratory: Negative for apnea, cough, chest tightness, shortness of breath and wheezing.   Cardiovascular: Negative for chest pain, palpitations and leg swelling.  Gastrointestinal: Positive for nausea and abdominal pain. Negative for vomiting, diarrhea, blood in stool, hematochezia, abdominal distention, anal bleeding and hematemesis.  Genitourinary: Negative for dysuria, hematuria, flank pain, vaginal discharge, difficulty urinating and menstrual problem.  Musculoskeletal: Negative for back pain and gait problem.  Skin: Negative for rash and wound.  Neurological: Negative for dizziness, tremors, seizures, syncope, facial asymmetry, numbness and headaches.  Psychiatric/Behavioral: Negative.   All other systems reviewed and are negative.    Allergies  Erythromycin and Penicillins  Home Medications   Current Outpatient Rx  Name Route Sig Dispense Refill  . VITAMIN B-12 PO Oral Take 1 tablet by mouth daily.    Marland Kitchen METFORMIN HCL 1000 MG PO TABS Oral Take 1,000 mg by mouth 2 (two) times daily with a meal.    . ADULT MULTIVITAMIN W/MINERALS CH Oral Take 1 tablet by mouth daily.    Marland Kitchen PANTOPRAZOLE SODIUM 20 MG PO TBEC Oral Take 1 tablet (20 mg total) by mouth daily. 30 tablet 1    BP 112/61  Pulse 90  Temp 98.1 F (36.7 C) (Oral)  Resp 20  SpO2 100%  LMP 02/27/2012  Physical Exam  Nursing note and vitals reviewed. Constitutional: She is oriented to person, place, and time. She appears well-developed and well-nourished. No distress.  HENT:  Head: Normocephalic and  atraumatic.  Right Ear: External ear normal.  Left Ear: External ear normal.  Nose: Nose normal.  Mouth/Throat: Oropharynx is clear and moist. No oropharyngeal exudate.  Eyes: Conjunctivae and EOM are normal. Pupils are equal, round, and reactive to light. Right eye exhibits no discharge. Left eye exhibits no discharge.  Neck: Normal range of motion. Neck supple. No JVD present.  No tracheal deviation present. No thyromegaly present.  Cardiovascular: Normal rate, regular rhythm, normal heart sounds and intact distal pulses.  Exam reveals no gallop and no friction rub.   No murmur heard. Pulmonary/Chest: Effort normal and breath sounds normal. No respiratory distress. She has no wheezes. She has no rales. She exhibits no tenderness.  Abdominal: Soft. Bowel sounds are normal. She exhibits no distension. There is tenderness (mild tenderness to palpation in the epigastric region). There is no rebound and no guarding.  Musculoskeletal: Normal range of motion.  Lymphadenopathy:    She has no cervical adenopathy.  Neurological: She is alert and oriented to person, place, and time. No cranial nerve deficit. Coordination normal.  Skin: Skin is warm. No rash noted. She is not diaphoretic.  Psychiatric: She has a normal mood and affect. Her behavior is normal. Judgment and thought content normal.    ED Course  Procedures (including critical care time)  Labs Reviewed  URINALYSIS, ROUTINE W REFLEX MICROSCOPIC - Abnormal; Notable for the following:    Glucose, UA >1000 (*)     All other components within normal limits  CBC WITH DIFFERENTIAL - Abnormal; Notable for the following:    RBC 5.14 (*)     All other components within normal limits  POCT I-STAT, CHEM 8 - Abnormal; Notable for the following:    BUN <3 (*)     Glucose, Bld 258 (*)     All other components within normal limits  COMPREHENSIVE METABOLIC PANEL - Abnormal; Notable for the following:    Glucose, Bld 258 (*)     BUN 5 (*)     Creatinine, Ser 0.44 (*)     Albumin 3.4 (*)     All other components within normal limits  POCT PREGNANCY, URINE  URINE MICROSCOPIC-ADD ON  LIPASE, BLOOD   US Abdomen Complete  03/29/2012  *RADIOLOGY REPORT*  Clinical Data:  Right upper quadrant and epigastric abdominal pain prior cholecystectomy  ABDOMINAL ULTRASOUND COMPLETE  Comparison:  None.  Findings:  Gallbladder:   Surgically absent  Common Bile Duct:  Within normal limits in caliber.  Liver: No focal mass lesion identified.  Within normal limits in parenchymal echogenicity.  IVC:  Appears normal.  Pancreas:  Poor visualization.  No gross abnormality.  Spleen:  Within normal limits in size and echotexture.  Right kidney:  Normal in size and parenchymal echogenicity.  No evidence of mass or hydronephrosis.  Left kidney:  Normal in size and parenchymal echogenicity.  No evidence of mass or hydronephrosis.  Abdominal Aorta:  No aneurysm identified.  Limited exam because of patient body habitus and bowel gas. Imaging of the lower abdomen and right lower quadrant demonstrates no focal abnormality or free fluid.  IMPRESSION: No acute finding by ultrasound.  Previous cholecystectomy  Poorly visualized pancreas  Original Report Authenticated By: Judie Petit. Ruel Favors, M.D.     1. Epigastric pain       MDM  36 year old female patient with past medical history of cholecystitis status post cholecystectomy and recurring epigastric abdominal pain for which she's been seen in the emergency department approximately 5 times in  the past few months presents with recurrence of her pain. Patient says pain is not changing in character but is not improving and thus returns. Pain is described as being in her epigastrium and radiating periumbilically. Similar to previous occurrences, not worsened or improved by anything, not associated with eating drinking, no nausea vomiting diarrhea, no fevers. Patient says every 5 minutes she gets approximately 60 seconds of cramping epigastric pain. Physical exam is normal except for mild epigastric tenderness to palpation but no peritonitis or significant abdominal pain on exam. Will get lipase CMP to assess for worsening pancreatic or liver function will also get right upper quadrant ultrasound to assess for any retained stones. Patient with normal urine labs negative pregnancy test.  Results for orders  placed during the hospital encounter of 03/29/12  URINALYSIS, ROUTINE W REFLEX MICROSCOPIC      Component Value Range   Color, Urine YELLOW  YELLOW   APPearance CLEAR  CLEAR   Specific Gravity, Urine 1.018  1.005 - 1.030   pH 6.0  5.0 - 8.0   Glucose, UA >1000 (*) NEGATIVE mg/dL   Hgb urine dipstick NEGATIVE  NEGATIVE   Bilirubin Urine NEGATIVE  NEGATIVE   Ketones, ur NEGATIVE  NEGATIVE mg/dL   Protein, ur NEGATIVE  NEGATIVE mg/dL   Urobilinogen, UA 1.0  0.0 - 1.0 mg/dL   Nitrite NEGATIVE  NEGATIVE   Leukocytes, UA NEGATIVE  NEGATIVE  CBC WITH DIFFERENTIAL      Component Value Range   WBC 8.5  4.0 - 10.5 K/uL   RBC 5.14 (*) 3.87 - 5.11 MIL/uL   Hemoglobin 14.4  12.0 - 15.0 g/dL   HCT 69.6  29.5 - 28.4 %   MCV 78.0  78.0 - 100.0 fL   MCH 28.0  26.0 - 34.0 pg   MCHC 35.9  30.0 - 36.0 g/dL   RDW 13.2  44.0 - 10.2 %   Platelets 312  150 - 400 K/uL   Neutrophils Relative 59  43 - 77 %   Neutro Abs 5.1  1.7 - 7.7 K/uL   Lymphocytes Relative 32  12 - 46 %   Lymphs Abs 2.7  0.7 - 4.0 K/uL   Monocytes Relative 7  3 - 12 %   Monocytes Absolute 0.6  0.1 - 1.0 K/uL   Eosinophils Relative 1  0 - 5 %   Eosinophils Absolute 0.1  0.0 - 0.7 K/uL   Basophils Relative 0  0 - 1 %   Basophils Absolute 0.0  0.0 - 0.1 K/uL  POCT PREGNANCY, URINE      Component Value Range   Preg Test, Ur NEGATIVE  NEGATIVE  POCT I-STAT, CHEM 8      Component Value Range   Sodium 140  135 - 145 mEq/L   Potassium 3.7  3.5 - 5.1 mEq/L   Chloride 102  96 - 112 mEq/L   BUN <3 (*) 6 - 23 mg/dL   Creatinine, Ser 7.25  0.50 - 1.10 mg/dL   Glucose, Bld 366 (*) 70 - 99 mg/dL   Calcium, Ion 4.40  3.47 - 1.23 mmol/L   TCO2 25  0 - 100 mmol/L   Hemoglobin 14.6  12.0 - 15.0 g/dL   HCT 42.5  95.6 - 38.7 %  URINE MICROSCOPIC-ADD ON      Component Value Range   Squamous Epithelial / LPF RARE  RARE   WBC, UA 0-2  <3 WBC/hpf  COMPREHENSIVE METABOLIC PANEL  Component Value Range   Sodium 140  135 - 145 mEq/L    Potassium 3.8  3.5 - 5.1 mEq/L   Chloride 103  96 - 112 mEq/L   CO2 25  19 - 32 mEq/L   Glucose, Bld 258 (*) 70 - 99 mg/dL   BUN 5 (*) 6 - 23 mg/dL   Creatinine, Ser 1.61 (*) 0.50 - 1.10 mg/dL   Calcium 9.2  8.4 - 09.6 mg/dL   Total Protein 7.3  6.0 - 8.3 g/dL   Albumin 3.4 (*) 3.5 - 5.2 g/dL   AST 21  0 - 37 U/L   ALT 22  0 - 35 U/L   Alkaline Phosphatase 108  39 - 117 U/L   Total Bilirubin 0.3  0.3 - 1.2 mg/dL   GFR calc non Af Amer >90  >90 mL/min   GFR calc Af Amer >90  >90 mL/min  LIPASE, BLOOD      Component Value Range   Lipase 13  11 - 59 U/L   US Abdomen Complete (Final result)   Result time:03/29/12 2031    Final result by Rad Results In Interface (03/29/12 20:31:39)    Narrative:   *RADIOLOGY REPORT*  Clinical Data: Right upper quadrant and epigastric abdominal pain prior cholecystectomy  ABDOMINAL ULTRASOUND COMPLETE  Comparison: None.  Findings:  Gallbladder: Surgically absent  Common Bile Duct: Within normal limits in caliber.  Liver: No focal mass lesion identified. Within normal limits in parenchymal echogenicity.  IVC: Appears normal.  Pancreas: Poor visualization. No gross abnormality.  Spleen: Within normal limits in size and echotexture.  Right kidney: Normal in size and parenchymal echogenicity. No evidence of mass or hydronephrosis.  Left kidney: Normal in size and parenchymal echogenicity. No evidence of mass or hydronephrosis.  Abdominal Aorta: No aneurysm identified.  Limited exam because of patient body habitus and bowel gas. Imaging of the lower abdomen and right lower quadrant demonstrates no focal abnormality or free fluid.  IMPRESSION: No acute finding by ultrasound.  Previous cholecystectomy  Poorly visualized pancreas  Original Report Authenticated By: Judie Petit. Ruel Favors, M.D.     Pain improves with Toradol and GI cocktail. Ultrasound is normal labs reassuring with no evidence of pancreatitis choledocholithiasis  hepatitis. We'll have patient followup with GI physician for possible EGD for gastritis or peptic ulcer disease contributing to her pain.  Case discussed with Dr. Bernerd Limbo, MD 03/29/12 2125

## 2012-03-29 NOTE — ED Notes (Signed)
Pt updated on plan of care, stepped outside

## 2012-03-29 NOTE — ED Notes (Signed)
Pt denies any questions upon discharge. 

## 2012-03-30 NOTE — ED Provider Notes (Signed)
I saw and evaluated the patient, reviewed the resident's note and I agree with the findings and plan.  PT with no guarding or rebound, seen multiple times for similar.  Encouraged outpt GI follow up.  Labs unremarkable. Pt imporved after IV toradol.  Abd soft, no resp distress, no urinary or GYN symptoms.      Gavin Pound. Damire Remedios, MD 03/30/12 1610

## 2012-05-16 ENCOUNTER — Encounter (HOSPITAL_COMMUNITY): Payer: Self-pay | Admitting: Family Medicine

## 2012-05-16 ENCOUNTER — Emergency Department (HOSPITAL_COMMUNITY): Payer: BC Managed Care – PPO

## 2012-05-16 ENCOUNTER — Emergency Department (HOSPITAL_COMMUNITY)
Admission: EM | Admit: 2012-05-16 | Discharge: 2012-05-16 | Disposition: A | Discharge status: Home / Self Care | Payer: BC Managed Care – PPO | Attending: Emergency Medicine | Admitting: Emergency Medicine

## 2012-05-16 DIAGNOSIS — F17200 Nicotine dependence, unspecified, uncomplicated: Secondary | ICD-10-CM | POA: Insufficient documentation

## 2012-05-16 DIAGNOSIS — R079 Chest pain, unspecified: Secondary | ICD-10-CM | POA: Insufficient documentation

## 2012-05-16 DIAGNOSIS — Z79899 Other long term (current) drug therapy: Secondary | ICD-10-CM | POA: Insufficient documentation

## 2012-05-16 DIAGNOSIS — E119 Type 2 diabetes mellitus without complications: Secondary | ICD-10-CM | POA: Insufficient documentation

## 2012-05-16 LAB — HEPATIC FUNCTION PANEL
AST: 12 U/L (ref 0–37)
Albumin: 3.5 g/dL (ref 3.5–5.2)
Alkaline Phosphatase: 114 U/L (ref 39–117)
Total Bilirubin: 0.2 mg/dL — ABNORMAL LOW (ref 0.3–1.2)

## 2012-05-16 LAB — CBC
HCT: 39.7 % (ref 36.0–46.0)
Hemoglobin: 14.2 g/dL (ref 12.0–15.0)
MCH: 27.4 pg (ref 26.0–34.0)
MCV: 76.6 fL — ABNORMAL LOW (ref 78.0–100.0)
RBC: 5.18 MIL/uL — ABNORMAL HIGH (ref 3.87–5.11)
WBC: 10.3 10*3/uL (ref 4.0–10.5)

## 2012-05-16 LAB — BASIC METABOLIC PANEL
CO2: 25 mEq/L (ref 19–32)
Calcium: 9.1 mg/dL (ref 8.4–10.5)
Chloride: 102 mEq/L (ref 96–112)
Creatinine, Ser: 0.54 mg/dL (ref 0.50–1.10)
Glucose, Bld: 332 mg/dL — ABNORMAL HIGH (ref 70–99)

## 2012-05-16 LAB — POCT I-STAT TROPONIN I: Troponin i, poc: 0 ng/mL (ref 0.00–0.08)

## 2012-05-16 MED ORDER — HYDROCODONE-ACETAMINOPHEN 5-325 MG PO TABS: 1.0000 | ORAL_TABLET | Freq: Four times a day (QID) | ORAL | Status: DC | PRN

## 2012-05-16 MED ORDER — IOHEXOL 350 MG/ML SOLN
100.0000 mL | Freq: Once | INTRAVENOUS | Status: AC | PRN
  Administered 2012-05-16: 100 mL via INTRAVENOUS

## 2012-05-16 MED ORDER — ASPIRIN 81 MG PO CHEW
324.0000 mg | CHEWABLE_TABLET | Freq: Once | ORAL | Status: AC
  Administered 2012-05-16: 324 mg via ORAL
  Filled 2012-05-16: qty 4

## 2012-05-16 MED ORDER — SODIUM CHLORIDE 0.9 % IV SOLN
Freq: Once | INTRAVENOUS | Status: AC
  Administered 2012-05-16: 21:00:00 via INTRAVENOUS

## 2012-05-16 MED ORDER — OXYCODONE-ACETAMINOPHEN 5-325 MG PO TABS
2.0000 | ORAL_TABLET | Freq: Once | ORAL | Status: AC
  Administered 2012-05-16: 2 via ORAL
  Filled 2012-05-16: qty 2

## 2012-05-16 MED ORDER — HYDROMORPHONE HCL PF 1 MG/ML IJ SOLN
1.0000 mg | Freq: Once | INTRAMUSCULAR | Status: AC
  Administered 2012-05-16: 1 mg via INTRAVENOUS
  Filled 2012-05-16: qty 1

## 2012-05-16 NOTE — ED Notes (Signed)
Family member given coffee and ginger ale per request.

## 2012-05-16 NOTE — ED Provider Notes (Signed)
History     CSN: [id number]  Arrival date & time 05/16/12  1725   First MD Initiated Contact with Patient 05/16/12 1820      Chief Complaint  Patient presents with  . Chest Pain    (Consider location/radiation/quality/duration/timing/severity/associated sxs/prior treatment) Patient is a 36 y.o. female presenting with chest pain. The history is provided by the patient (pt complains of chest pain).  Chest Pain The chest pain began less than 1 hour ago. Chest pain occurs frequently. The chest pain is resolved. The pain is associated with stress. At its most intense, the pain is at 5/10. The pain is currently at 2/10. The severity of the pain is moderate. The quality of the pain is described as aching. The pain does not radiate. Chest pain is worsened by certain positions. Primary symptoms include a fever. Pertinent negatives for primary symptoms include no fatigue, no cough and no abdominal pain.  Pertinent negatives for past medical history include no seizures.     Past Medical History  Diagnosis Date  . Diabetes mellitus     2002; no meds since 2004    Past Surgical History  Procedure Date  . Cesarean section   . Gallbladder surgery   . Tubal ligation     2000    History reviewed. No pertinent family history.  History  Substance Use Topics  . Smoking status: Current Everyday Smoker -- 1.0 packs/day    Types: Cigarettes  . Smokeless tobacco: Not on file  . Alcohol Use: Yes    OB History    Grav Para Term Preterm Abortions TAB SAB Ect Mult Living   4 3 3  1  1   3       Review of Systems  Constitutional: Positive for fever. Negative for fatigue.  HENT: Negative for congestion, sinus pressure and ear discharge.   Eyes: Negative for discharge.  Respiratory: Negative for cough.   Cardiovascular: Positive for chest pain.  Gastrointestinal: Negative for abdominal pain and diarrhea.  Genitourinary: Negative for frequency and hematuria.  Musculoskeletal: Negative for  back pain.  Skin: Negative for rash.  Neurological: Negative for seizures and headaches.  Hematological: Negative.   Psychiatric/Behavioral: Negative for hallucinations.    Allergies  Erythromycin and Penicillins  Home Medications   Current Outpatient Rx  Name Route Sig Dispense Refill  . VITAMIN B-12 PO Oral Take 1 tablet by mouth daily.    Marland Kitchen METFORMIN HCL 1000 MG PO TABS Oral Take 1,000 mg by mouth 2 (two) times daily with a meal.    . ADULT MULTIVITAMIN W/MINERALS CH Oral Take 1 tablet by mouth daily.    Marland Kitchen VITAMIN C 500 MG PO TABS Oral Take 1,000 mg by mouth daily.    Marland Kitchen HYDROCODONE-ACETAMINOPHEN 5-325 MG PO TABS Oral Take 1 tablet by mouth every 6 (six) hours as needed for pain. 20 tablet 0    BP 115/80  Pulse 82  Temp 98.5 F (36.9 C) (Oral)  Resp 20  SpO2 98%  LMP 05/09/2012  Physical Exam  Constitutional: She is oriented to person, place, and time. She appears well-developed.  HENT:  Head: Normocephalic and atraumatic.  Eyes: Conjunctivae normal and EOM are normal. No scleral icterus.  Neck: Neck supple. No thyromegaly present.  Cardiovascular: Normal rate and regular rhythm.  Exam reveals no gallop and no friction rub.   No murmur heard. Pulmonary/Chest: No stridor. She has no wheezes. She has no rales. She exhibits no tenderness.  Abdominal: She exhibits no distension.  There is no tenderness. There is no rebound.  Musculoskeletal: Normal range of motion. She exhibits no edema.  Lymphadenopathy:    She has no cervical adenopathy.  Neurological: She is oriented to person, place, and time. Coordination normal.  Skin: No rash noted. No erythema.  Psychiatric: She has a normal mood and affect. Her behavior is normal.    ED Course  Procedures (including critical care time)  Labs Reviewed  CBC - Abnormal; Notable for the following:    RBC 5.18 (*)     MCV 76.6 (*)     All other components within normal limits  BASIC METABOLIC PANEL - Abnormal; Notable for the  following:    Glucose, Bld 332 (*)     All other components within normal limits  D-DIMER, QUANTITATIVE - Abnormal; Notable for the following:    D-Dimer, Quant 0.95 (*)     All other components within normal limits  HEPATIC FUNCTION PANEL - Abnormal; Notable for the following:    Total Bilirubin 0.2 (*)     All other components within normal limits  TROPONIN I  POCT I-STAT TROPONIN I   Dg Chest 2 View  05/16/2012  *RADIOLOGY REPORT*  Clinical Data: Smoker with history of hypertension, presenting with chest pain.  CHEST - 2 VIEW  Comparison: None.  Findings: Suboptimal inspiration due to body habitus which accounts for crowded bronchovascular markings at the bases and accentuates the cardiac silhouette.  Taking this into account, cardiomediastinal silhouette unremarkable.  Minimal linear atelectasis or scar in the lingula.  Lungs otherwise clear. Visualized bony thorax intact.  IMPRESSION: Suboptimal inspiration.  Minimal linear atelectasis or scar in the lingula.  No acute cardiopulmonary disease otherwise.   Original Report Authenticated By: Arnell Sieving, M.D.    Ct Angio Chest Pe W/cm &/or Wo Cm  05/16/2012  *RADIOLOGY REPORT*  Clinical Data: Shortness of breath.  Clinical concern for pulmonary embolism.  CT ANGIOGRAPHY CHEST  Technique:  Multidetector CT imaging of the chest using the standard protocol during bolus administration of intravenous contrast. Multiplanar reconstructed images including MIPs were obtained and reviewed to evaluate the vascular anatomy.  Contrast: OMNIPAQUE IOHEXOL 350 MG/ML SOLN  Comparison: Chest radiographs obtained earlier today.  Findings: The examination is limited by photon starvation due to the large size of the patient.  No pulmonary arterial filling defects are visualized.  Mildly prominent interstitial markings in both lungs, most pronounced in the lower lobes.  No lung masses or enlarged lymph nodes.  Unremarkable upper abdomen.  Thoracic spine  degenerative changes.  IMPRESSION:  1.  Limited examination due to the large size of the patient. 2.  No pulmonary emboli seen. 3.  Mild interstitial lung disease, primarily in the lower lobes. This could represent mild pulmonary edema or interstitial pneumonitis.   Original Report Authenticated By: Darrol Angel, M.D.      1. Chest pain       MDM         Benny Lennert, MD 05/19/12 1034

## 2012-05-16 NOTE — ED Notes (Signed)
Pt reports pain to left chest radiating to left arm starting around 07:30 this morning. Reports nausea, diaphoresis, sob.

## 2012-05-16 NOTE — ED Notes (Signed)
Patient transported to CT 

## 2012-05-18 ENCOUNTER — Emergency Department (HOSPITAL_COMMUNITY)
Admission: EM | Admit: 2012-05-18 | Discharge: 2012-05-18 | Disposition: A | Discharge status: Home / Self Care | Payer: BC Managed Care – PPO | Attending: Emergency Medicine | Admitting: Emergency Medicine

## 2012-05-18 ENCOUNTER — Encounter (HOSPITAL_COMMUNITY): Payer: Self-pay | Admitting: Emergency Medicine

## 2012-05-18 ENCOUNTER — Emergency Department (HOSPITAL_COMMUNITY): Payer: BC Managed Care – PPO

## 2012-05-18 DIAGNOSIS — M79609 Pain in unspecified limb: Secondary | ICD-10-CM | POA: Insufficient documentation

## 2012-05-18 DIAGNOSIS — R739 Hyperglycemia, unspecified: Secondary | ICD-10-CM

## 2012-05-18 DIAGNOSIS — E1169 Type 2 diabetes mellitus with other specified complication: Secondary | ICD-10-CM | POA: Insufficient documentation

## 2012-05-18 DIAGNOSIS — J40 Bronchitis, not specified as acute or chronic: Secondary | ICD-10-CM | POA: Insufficient documentation

## 2012-05-18 DIAGNOSIS — F17200 Nicotine dependence, unspecified, uncomplicated: Secondary | ICD-10-CM | POA: Insufficient documentation

## 2012-05-18 LAB — CBC WITH DIFFERENTIAL/PLATELET
Basophils Relative: 0 % (ref 0–1)
Eosinophils Absolute: 0.3 10*3/uL (ref 0.0–0.7)
HCT: 39.3 % (ref 36.0–46.0)
Hemoglobin: 13.9 g/dL (ref 12.0–15.0)
MCH: 27.5 pg (ref 26.0–34.0)
MCHC: 35.4 g/dL (ref 30.0–36.0)
Monocytes Absolute: 0.6 10*3/uL (ref 0.1–1.0)
Monocytes Relative: 6 % (ref 3–12)

## 2012-05-18 LAB — GLUCOSE, CAPILLARY: Glucose-Capillary: 313 mg/dL — ABNORMAL HIGH (ref 70–99)

## 2012-05-18 LAB — COMPREHENSIVE METABOLIC PANEL
Albumin: 3.2 g/dL — ABNORMAL LOW (ref 3.5–5.2)
BUN: 7 mg/dL (ref 6–23)
Chloride: 100 mEq/L (ref 96–112)
Creatinine, Ser: 0.44 mg/dL — ABNORMAL LOW (ref 0.50–1.10)
GFR calc Af Amer: >90 mL/min (ref >90)
Total Bilirubin: 0.2 mg/dL — ABNORMAL LOW (ref 0.3–1.2)

## 2012-05-18 LAB — TROPONIN I: Troponin I: <0.3 ng/mL (ref <0.30)

## 2012-05-18 LAB — PROTIME-INR: Prothrombin Time: 14 seconds (ref 11.6–15.2)

## 2012-05-18 MED ORDER — OXYCODONE-ACETAMINOPHEN 5-325 MG PO TABS: 1.0000 | ORAL_TABLET | Freq: Four times a day (QID) | ORAL | Status: AC | PRN

## 2012-05-18 MED ORDER — HYDROCODONE-ACETAMINOPHEN 5-325 MG PO TABS
1.0000 | ORAL_TABLET | Freq: Once | ORAL | Status: AC
  Administered 2012-05-18: 1 via ORAL
  Filled 2012-05-18: qty 1

## 2012-05-18 MED ORDER — ALBUTEROL SULFATE HFA 108 (90 BASE) MCG/ACT IN AERS
2.0000 | INHALATION_SPRAY | RESPIRATORY_TRACT | Status: DC | PRN
  Administered 2012-05-18: 2 via RESPIRATORY_TRACT
  Filled 2012-05-18: qty 6.7

## 2012-05-18 MED ORDER — SODIUM CHLORIDE 0.9 % IV BOLUS (SEPSIS)
500.0000 mL | Freq: Once | INTRAVENOUS | Status: AC
  Administered 2012-05-18: 500 mL via INTRAVENOUS

## 2012-05-18 MED ORDER — INSULIN REGULAR HUMAN 100 UNIT/ML IJ SOLN: 6.0000 [IU] | Freq: Once | INTRAMUSCULAR | Status: DC

## 2012-05-18 MED ORDER — INSULIN ASPART 100 UNIT/ML ~~LOC~~ SOLN
6.0000 [IU] | Freq: Once | SUBCUTANEOUS | Status: AC
  Administered 2012-05-18: 6 [IU] via SUBCUTANEOUS
  Filled 2012-05-18: qty 1

## 2012-05-18 NOTE — ED Notes (Signed)
Pt reports increased pain and shortness of breath. and to ED from car. No difficulty speaking .Husband at bedside

## 2012-05-18 NOTE — ED Notes (Signed)
US at bedside

## 2012-05-18 NOTE — ED Provider Notes (Signed)
History     CSN: [id number]  Arrival date & time 05/18/12  1403   First MD Initiated Contact with Patient 05/18/12 1600      Chief Complaint  Patient presents with  . Shortness of Breath  . Leg Pain    l/lower leg pain  . Chest Pain    midchest  pain radiating l/shoulder    (Consider location/radiation/quality/duration/timing/severity/associated sxs/prior treatment) Patient is a 36 y.o. female presenting with shortness of breath, leg pain, and chest pain. The history is provided by the patient.  Shortness of Breath  The current episode started today. Associated symptoms include chest pain, cough and shortness of breath.  Leg Pain   Chest Pain Primary symptoms include shortness of breath and cough.    patient's had shortness of breath and chest pain for the last 2 days. She was seen in the ER on Monday and had a negative CT and she'll after a positive d-dimer. A CT angioedema negative for PE but was of poor quality. It did show some chronic lung diseases. She states she's continued to have symptoms. She states she is now started to have a cough with some mild sputum. She states her sugars have been a little high. She states she was off her metformin because of the CAT scan. No fevers. No lightheadedness. She does smoke every day. No lightheadedness or dizziness. She also states that her LLE has become swollen.   Past Medical History  Diagnosis Date  . Diabetes mellitus     2002; no meds since 2004    Past Surgical History  Procedure Date  . Cesarean section   . Gallbladder surgery   . Tubal ligation     2000    No family history on file.  History  Substance Use Topics  . Smoking status: Current Every Day Smoker -- 1.0 packs/day    Types: Cigarettes  . Smokeless tobacco: Not on file  . Alcohol Use: Yes    OB History    Grav Para Term Preterm Abortions TAB SAB Ect Mult Living   4 3 3  1  1   3       Review of Systems  Constitutional: Negative for appetite  change.  Eyes: Negative for itching.  Respiratory: Positive for cough and shortness of breath.   Cardiovascular: Positive for chest pain.  Genitourinary: Negative for flank pain.  Musculoskeletal: Negative for back pain.  Neurological: Negative for headaches.  Psychiatric/Behavioral: Negative for behavioral problems.    Allergies  Erythromycin and Penicillins  Home Medications   Current Outpatient Rx  Name Route Sig Dispense Refill  . VITAMIN B-12 PO Oral Take 1 tablet by mouth daily.    Marland Kitchen HYDROCODONE-ACETAMINOPHEN 5-325 MG PO TABS Oral Take 1 tablet by mouth every 6 (six) hours as needed for pain. 20 tablet 0  . METFORMIN HCL 1000 MG PO TABS Oral Take 1,000 mg by mouth 2 (two) times daily with a meal.    . ADULT MULTIVITAMIN W/MINERALS CH Oral Take 1 tablet by mouth daily.    Marland Kitchen VITAMIN C 500 MG PO TABS Oral Take 1,000 mg by mouth daily.      BP 114/56  Pulse 87  Temp 98.9 F (37.2 C) (Oral)  Resp 18  SpO2 99%  LMP 05/09/2012  Physical Exam  Nursing note and vitals reviewed. Constitutional: She is oriented to person, place, and time. She appears well-developed and well-nourished.  HENT:  Head: Normocephalic and atraumatic.  Eyes: EOM are normal. Pupils  are equal, round, and reactive to light.  Neck: Normal range of motion. Neck supple.  Cardiovascular: Normal rate, regular rhythm and normal heart sounds.   No murmur heard. Pulmonary/Chest: Effort normal. No respiratory distress. She has wheezes. She has no rales.  Abdominal: Soft. Bowel sounds are normal. She exhibits no distension. There is no tenderness. There is no rebound and no guarding.  Musculoskeletal: Normal range of motion. She exhibits edema.       Trace edema to LLE  Neurological: She is alert and oriented to person, place, and time. No cranial nerve deficit.  Skin: Skin is warm and dry.  Psychiatric: She has a normal mood and affect. Her speech is normal.    ED Course  Procedures (including critical  care time)  Labs Reviewed  CBC WITH DIFFERENTIAL - Abnormal; Notable for the following:    MCV 77.7 (*)     All other components within normal limits  COMPREHENSIVE METABOLIC PANEL - Abnormal; Notable for the following:    Potassium 3.4 (*)     Glucose, Bld 385 (*)     Creatinine, Ser 0.44 (*)     Albumin 3.2 (*)     Total Bilirubin 0.2 (*)     All other components within normal limits  GLUCOSE, CAPILLARY - Abnormal; Notable for the following:    Glucose-Capillary 313 (*)     All other components within normal limits  GLUCOSE, CAPILLARY - Abnormal; Notable for the following:    Glucose-Capillary 256 (*)     All other components within normal limits  TROPONIN I  PROTIME-INR  APTT   Dg Chest 2 View  05/18/2012  *RADIOLOGY REPORT*  Clinical Data: Cough.  Shortness of breath.  Chest pain.  CHEST - 2 VIEW  Comparison: Two-view chest x-ray and CTA chest 2 days ago.  Findings: Suboptimal inspiration due to body habitus which accounts for crowded bronchovascular markings at the bases and accentuates the cardiac silhouette.  Taking this into account, cardiomediastinal silhouette unremarkable.  Interval development of mild to moderate central peribronchial thickening.  Stable linear atelectasis or scar in the lingula.  Lungs otherwise clear.  No pleural effusions.  Mild focal eventration of the right anterior hemidiaphragm.  Visualized bony thorax intact.  IMPRESSION: Developing acute bronchitis and/or asthma without localized airspace pneumonia.   Original Report Authenticated By: Arnell Sieving, M.D.    Ct Angio Chest Pe W/cm &/or Wo Cm  05/16/2012  *RADIOLOGY REPORT*  Clinical Data: Shortness of breath.  Clinical concern for pulmonary embolism.  CT ANGIOGRAPHY CHEST  Technique:  Multidetector CT imaging of the chest using the standard protocol during bolus administration of intravenous contrast. Multiplanar reconstructed images including MIPs were obtained and reviewed to evaluate the vascular  anatomy.  Contrast: OMNIPAQUE IOHEXOL 350 MG/ML SOLN  Comparison: Chest radiographs obtained earlier today.  Findings: The examination is limited by photon starvation due to the large size of the patient.  No pulmonary arterial filling defects are visualized.  Mildly prominent interstitial markings in both lungs, most pronounced in the lower lobes.  No lung masses or enlarged lymph nodes.  Unremarkable upper abdomen.  Thoracic spine degenerative changes.  IMPRESSION:  1.  Limited examination due to the large size of the patient. 2.  No pulmonary emboli seen. 3.  Mild interstitial lung disease, primarily in the lower lobes. This could represent mild pulmonary edema or interstitial pneumonitis.   Original Report Authenticated By: Darrol Angel, M.D.      1. Bronchitis  2. Hyperglycemia     Date: 05/18/2012  Rate: 118  Rhythm: sinus tachycardia  QRS Axis: normal  Intervals: normal  ST/T Wave abnormalities: normal  Conduction Disutrbances:none  Narrative Interpretation:   Old EKG Reviewed: none available     MDM  Patient with cough and chest pain. Recently seen for the same with reassuring. CT angiogram. Troponin is still negative. EKG is reassuring except for mild tachycardia. Venous Doppler was done due to patient's complaint of swelling in her left leg. Doppler was done and negative except for decreased compressibility proximally. I discussed with Dr. Hart Rochester, who recommended followup if increased swelling. Will DC home. Patient was given albuterol. She'll followup with her primary care doctor as needed. Hyper glycemia is improved with fluids and insulin.        Juliet Rude. Rubin Payor, MD 05/18/12 1910

## 2012-05-18 NOTE — ED Notes (Signed)
Dr. Rubin Payor aware that pt is requesting pain medication. Dr. Maury Dus call from vasc surgeon before ordering medication. Pt aware.

## 2012-05-18 NOTE — ED Notes (Signed)
Pt states chest pain has worsened since Monday, also reports left leg pain that started last night. Reports no relief in chest pain with prescription of pain medication. States pain medication "puts me to sleep and when I wake up the pain is right back." Reports feeling "like I can't catch my breath."

## 2012-05-18 NOTE — ED Notes (Signed)
Pt reports increased chest pain, l/lower leg pain and shortness of breath

## 2012-05-18 NOTE — Progress Notes (Signed)
*  Preliminary Results* Left lower extremity venous duplex completed. There is no obvious evidence of left lower extremity deep vein thrombosis, however the left femoral vein was partially compressible. This is likely due to pt being unable to relax leg and control breathing, however deep vein thrombosis cannot be excluded. Preliminary results discussed with RN Stark Jock.  05/18/2012 4:45 PM Gertie Fey, RDMS, RDCS

## 2012-05-19 ENCOUNTER — Emergency Department (HOSPITAL_COMMUNITY)
Admission: EM | Admit: 2012-05-19 | Discharge: 2012-05-19 | Disposition: A | Discharge status: Home / Self Care | Payer: BC Managed Care – PPO | Source: Home / Self Care

## 2012-05-19 ENCOUNTER — Emergency Department (HOSPITAL_COMMUNITY)
Admission: EM | Admit: 2012-05-19 | Discharge: 2012-05-20 | Disposition: A | Discharge status: Home / Self Care | Payer: BC Managed Care – PPO | Attending: Emergency Medicine | Admitting: Emergency Medicine

## 2012-05-19 ENCOUNTER — Encounter (HOSPITAL_COMMUNITY): Payer: Self-pay | Admitting: *Deleted

## 2012-05-19 ENCOUNTER — Encounter (HOSPITAL_COMMUNITY): Payer: Self-pay | Admitting: Family Medicine

## 2012-05-19 ENCOUNTER — Emergency Department (HOSPITAL_COMMUNITY): Payer: BC Managed Care – PPO

## 2012-05-19 DIAGNOSIS — J189 Pneumonia, unspecified organism: Secondary | ICD-10-CM | POA: Insufficient documentation

## 2012-05-19 DIAGNOSIS — R112 Nausea with vomiting, unspecified: Secondary | ICD-10-CM | POA: Insufficient documentation

## 2012-05-19 DIAGNOSIS — R0602 Shortness of breath: Secondary | ICD-10-CM | POA: Insufficient documentation

## 2012-05-19 DIAGNOSIS — R509 Fever, unspecified: Secondary | ICD-10-CM | POA: Insufficient documentation

## 2012-05-19 DIAGNOSIS — F17200 Nicotine dependence, unspecified, uncomplicated: Secondary | ICD-10-CM | POA: Insufficient documentation

## 2012-05-19 DIAGNOSIS — R079 Chest pain, unspecified: Secondary | ICD-10-CM | POA: Insufficient documentation

## 2012-05-19 LAB — URINALYSIS, ROUTINE W REFLEX MICROSCOPIC
Leukocytes, UA: NEGATIVE
Nitrite: NEGATIVE
Specific Gravity, Urine: 1.018 (ref 1.005–1.030)
pH: 7 (ref 5.0–8.0)

## 2012-05-19 LAB — URINE MICROSCOPIC-ADD ON

## 2012-05-19 LAB — BASIC METABOLIC PANEL
BUN: 4 mg/dL — ABNORMAL LOW (ref 6–23)
BUN: 3 mg/dL — ABNORMAL LOW (ref 6–23)
Chloride: 102 mEq/L (ref 96–112)
Chloride: 100 mEq/L (ref 96–112)
GFR calc non Af Amer: >90 mL/min (ref >90)
Glucose, Bld: 284 mg/dL — ABNORMAL HIGH (ref 70–99)
Glucose, Bld: 298 mg/dL — ABNORMAL HIGH (ref 70–99)
Potassium: 3.6 mEq/L (ref 3.5–5.1)
Potassium: 3.3 mEq/L — ABNORMAL LOW (ref 3.5–5.1)

## 2012-05-19 LAB — CBC
HCT: 38.2 % (ref 36.0–46.0)
HCT: 39.6 % (ref 36.0–46.0)
Hemoglobin: 13.6 g/dL (ref 12.0–15.0)
Hemoglobin: 13.7 g/dL (ref 12.0–15.0)
MCHC: 35.6 g/dL (ref 30.0–36.0)
MCV: 77.6 fL — ABNORMAL LOW (ref 78.0–100.0)
WBC: 10.5 10*3/uL (ref 4.0–10.5)

## 2012-05-19 LAB — POCT I-STAT TROPONIN I

## 2012-05-19 MED ORDER — SODIUM CHLORIDE 0.9 % IV BOLUS (SEPSIS)
1000.0000 mL | Freq: Once | INTRAVENOUS | Status: AC
  Administered 2012-05-19: 1000 mL via INTRAVENOUS

## 2012-05-19 MED ORDER — ONDANSETRON HCL 4 MG/2ML IJ SOLN
4.0000 mg | Freq: Once | INTRAMUSCULAR | Status: AC
  Administered 2012-05-19: 4 mg via INTRAVENOUS
  Filled 2012-05-19: qty 2

## 2012-05-19 MED ORDER — KETOROLAC TROMETHAMINE 30 MG/ML IJ SOLN
30.0000 mg | Freq: Once | INTRAMUSCULAR | Status: AC
  Administered 2012-05-19: 30 mg via INTRAVENOUS
  Filled 2012-05-19: qty 1

## 2012-05-19 MED ORDER — ALBUTEROL SULFATE (5 MG/ML) 0.5% IN NEBU
5.0000 mg | INHALATION_SOLUTION | Freq: Once | RESPIRATORY_TRACT | Status: AC
  Administered 2012-05-19: 5 mg via RESPIRATORY_TRACT

## 2012-05-19 MED ORDER — MOXIFLOXACIN HCL 400 MG PO TABS
400.0000 mg | ORAL_TABLET | Freq: Once | ORAL | Status: AC
  Administered 2012-05-19: 400 mg via ORAL
  Filled 2012-05-19: qty 1

## 2012-05-19 NOTE — ED Notes (Signed)
Patient reports she is going to leave due to wait  She is complaining of ongoing chest pain and sob.  CN being called

## 2012-05-19 NOTE — ED Notes (Signed)
Pt reports that she has been seen for this chest pain multiple times. Reports pain to left chest going down left arm. States pain is everywhere. Reports sob and N/V.

## 2012-05-19 NOTE — ED Notes (Signed)
Patient reports that she was evaluated here on Monday and Wednesday. States that was on Monday a blood test showed that she may have had a blood clot, a CT was done and did not reveal a clot. She reports that she was no better and returned for re-evaluation on Wednesday and was diagnosed with bronchitis and given an inhaler. Reports they checked her left leg for a clot and she did not have one. Today, she states that the chest pain continues and that now she has nausea and vomited; has had 2 episodes of vomiting today.

## 2012-05-19 NOTE — ED Notes (Signed)
Patient reports onset of chest pain on Monday, she was seen by [facility] Monday and yesterday.  She is here today due to ongoing chest pain, cough, and n/v

## 2012-05-20 MED ORDER — HYDROCODONE-ACETAMINOPHEN 5-325 MG PO TABS: ORAL_TABLET | ORAL | Status: DC

## 2012-05-20 MED ORDER — MOXIFLOXACIN HCL 400 MG PO TABS: 400.0000 mg | ORAL_TABLET | Freq: Every day | ORAL | Status: AC

## 2012-05-20 MED ORDER — HYDROCODONE-ACETAMINOPHEN 5-325 MG PO TABS
2.0000 | ORAL_TABLET | Freq: Once | ORAL | Status: AC
  Administered 2012-05-20: 2 via ORAL
  Filled 2012-05-20: qty 2

## 2012-05-20 MED ORDER — ONDANSETRON 8 MG PO TBDP: 8.0000 mg | ORAL_TABLET | Freq: Three times a day (TID) | ORAL | Status: DC | PRN

## 2012-05-20 NOTE — ED Provider Notes (Signed)
History     CSN: [id number]  Arrival date & time 05/19/12  1901   First MD Initiated Contact with Patient 05/19/12 2047      Chief Complaint  Patient presents with  . Chest Pain  . Shortness of Breath  . Nausea  . Emesis    (Consider location/radiation/quality/duration/timing/severity/associated sxs/prior treatment) HPI Patient is a 36 yo female with history of diabetes who presents again for evaluation today of chest pain and shortness of breath. Pain is rated as a 5/10 and worse with coughing. She's been treated for bronchitis recently with multiple presentations for chest pain. During these visits patient has been evaluated for PE was negative CT PA on Monday. Patient has also had multiple chest x-rays and lab work. Patient continues to complain of cough as well shortness of breath, nausea, and vomiting. Patient has been using an albuterol inhaler that was provided yesterday. Patient developed nausea and vomiting since last visit and has had 2 episodes of vomiting today. She denies any abdominal pain or urinary symptoms. Patient denies any fevers and is tachycardic on presentation but afebrile. She has not taken her metformin since Monday as she was told not to following her CT of the chest. There are no other associated or modifying factors. Past Medical History  Diagnosis Date  . Diabetes mellitus     2002; no meds since 2004    Past Surgical History  Procedure Date  . Cesarean section   . Gallbladder surgery   . Tubal ligation     2000    History reviewed. No pertinent family history.  History  Substance Use Topics  . Smoking status: Current Every Day Smoker -- 1.0 packs/day    Types: Cigarettes  . Smokeless tobacco: Not on file  . Alcohol Use: Yes    OB History    Grav Para Term Preterm Abortions TAB SAB Ect Mult Living   4 3 3  1  1   3       Review of Systems  Constitutional: Positive for chills and fatigue.  HENT: Negative.   Eyes: Negative.     Respiratory: Positive for cough and shortness of breath.   Cardiovascular: Positive for chest pain.  Gastrointestinal: Positive for nausea and vomiting.  Genitourinary: Negative.   Musculoskeletal: Positive for myalgias.  Skin: Negative.   Neurological: Negative.   Hematological: Negative.   Psychiatric/Behavioral: Negative.   All other systems reviewed and are negative.    Allergies  Erythromycin and Penicillins  Home Medications   Current Outpatient Rx  Name Route Sig Dispense Refill  . VITAMIN B-12 PO Oral Take 1 tablet by mouth daily.    Marland Kitchen HYDROCODONE-ACETAMINOPHEN 5-325 MG PO TABS Oral Take 1 tablet by mouth every 6 (six) hours as needed for pain. 20 tablet 0  . METFORMIN HCL 1000 MG PO TABS Oral Take 1,000 mg by mouth 2 (two) times daily with a meal.    . ADULT MULTIVITAMIN W/MINERALS CH Oral Take 1 tablet by mouth daily.    . OXYCODONE-ACETAMINOPHEN 5-325 MG PO TABS Oral Take 1-2 tablets by mouth every 6 (six) hours as needed for pain. 10 tablet 0  . VITAMIN C 500 MG PO TABS Oral Take 1,000 mg by mouth daily.    Marland Kitchen HYDROCODONE-ACETAMINOPHEN 5-325 MG PO TABS  Take 1-2 tabs po q 6 hours PRN pain 30 tablet 0  . MOXIFLOXACIN HCL 400 MG PO TABS Oral Take 1 tablet (400 mg total) by mouth daily. 9 tablet 0  .  ONDANSETRON 8 MG PO TBDP Oral Take 1 tablet (8 mg total) by mouth every 8 (eight) hours as needed for nausea. 30 tablet 0    BP 107/54  Pulse 104  Temp 99.8 F (37.7 C) (Oral)  Resp 26  SpO2 94%  LMP 05/09/2012  Physical Exam  Nursing note and vitals reviewed. GEN: Well-developed, obese female in no distress HEENT: Atraumatic, normocephalic.  EYES: PERRLA BL, no scleral icterus. NECK: Trachea midline, no meningismus CV: Tachycardic with regular rhythm. No murmurs, rubs, or gallops PULM: No respiratory distress.  No crackles or rales. Wheezes appreciated throughout. GI: soft, non-tender. No guarding, rebound, or tenderness. + bowel sounds  GU: deferred Neuro:  cranial nerves grossly 2-12 intact, no abnormalities of strength or sensation, A and O x 3 MSK: Patient moves all 4 extremities symmetrically, no deformity, edema, or injury noted Skin: No rashes petechiae, purpura, or jaundice Psych: no abnormality of mood   ED Course  Procedures (including critical care time)  Indication: Chest pain Please note this EKG was reviewed extemporaneously by myself.   Date: 05/19/2012  Rate: 108  Rhythm: sinus tachycardia  QRS Axis: normal  Intervals: normal  ST/T Wave abnormalities: nonspecific T wave changes  Conduction Disutrbances:none  Narrative Interpretation:   Old EKG Reviewed: unchanged      Labs Reviewed  CBC - Abnormal; Notable for the following:    RBC 5.15 (*)     MCV 76.9 (*)     All other components within normal limits  BASIC METABOLIC PANEL - Abnormal; Notable for the following:    Potassium 3.3 (*)     Glucose, Bld 298 (*)     BUN 3 (*)     Creatinine, Ser 0.38 (*)     All other components within normal limits  URINALYSIS, ROUTINE W REFLEX MICROSCOPIC - Abnormal; Notable for the following:    Glucose, UA >1000 (*)     All other components within normal limits  URINE MICROSCOPIC-ADD ON - Abnormal; Notable for the following:    Bacteria, UA MANY (*)     All other components within normal limits  PRO B NATRIURETIC PEPTIDE  POCT I-STAT TROPONIN I  POCT PREGNANCY, URINE   Dg Chest 2 View  05/19/2012  *RADIOLOGY REPORT*  Clinical Data: Chest pain.  Shortness of breath.  Nausea.  Fever. Myalgias.  Smoker.  CHEST - 2 VIEW  Comparison: 05/18/2012.  Findings: The examination is limited by a poor inspiration and artifacts produced by the patient's hair.  On the lateral view, there is increased density in the inferior aspect of the right upper lobe anteriorly and increased density in the anterior aspect of one of the lower lobes.  There is probable corresponding opacity at the medial right lung base and infrahilar region on the  frontal view.  The left lung appears grossly clear.  Mild diffuse peribronchial thickening.  Normal sized heart.  The visualized bones are unremarkable.  IMPRESSION:  1.  Limited examination due to artifacts produced by the patient's hair. 2.  Probable small amount of pneumonia and atelectasis in the inferior right middle lobe and anterior right lower lobe. 3.  Mild to moderate bronchitic changes.   Original Report Authenticated By: Darrol Angel, M.D.    Dg Chest 2 View  05/18/2012  *RADIOLOGY REPORT*  Clinical Data: Cough.  Shortness of breath.  Chest pain.  CHEST - 2 VIEW  Comparison: Two-view chest x-ray and CTA chest 2 days ago.  Findings: Suboptimal inspiration due to  body habitus which accounts for crowded bronchovascular markings at the bases and accentuates the cardiac silhouette.  Taking this into account, cardiomediastinal silhouette unremarkable.  Interval development of mild to moderate central peribronchial thickening.  Stable linear atelectasis or scar in the lingula.  Lungs otherwise clear.  No pleural effusions.  Mild focal eventration of the right anterior hemidiaphragm.  Visualized bony thorax intact.  IMPRESSION: Developing acute bronchitis and/or asthma without localized airspace pneumonia.   Original Report Authenticated By: Arnell Sieving, M.D.      1. CAP (community acquired pneumonia)       MDM  Patient was evaluated by myself. Based on evaluation patient had additional labs ordered to per protocol labs performed by nursing. The patient had already had a metabolic panel, CBC, troponin, and BNP ordered by nursing. I would not have ordered the BNP R. troponin independently. Both of these labs are negative. Renal panel showed slight hypokalemia with no significant renal compromise. Patient was hyperglycemic but had no anion gap. Patient did not have a leukocytosis on CBC but this is relatively elevated compared to prior values. EKG was unchanged. Urinalysis showed no signs of  infection and urine pregnancy was negative. Chest x-ray was remarkable for a pneumonia noted today. Patient was treated with Avelox given history of diabetes. She also received a liter of normal saline IV bolus. Patient's myalgias were treated with Toradol and she had improvement. She continued to complain of some pain which is likely secondary to her acute pulmonary process. Patient was given 2 tabs of Vicodin. She was discharged with 9 days of Avelox as well as prescription for Zofran for her nausea and Vicodin for pain. Patient was improved prior to discharge were for discharge home. She was discharged in good condition.        Cyndra Numbers, MD 05/20/12 [id number]

## 2012-07-07 ENCOUNTER — Ambulatory Visit (INDEPENDENT_AMBULATORY_CARE_PROVIDER_SITE_OTHER)
Admission: RE | Admit: 2012-07-07 | Discharge: 2012-07-07 | Disposition: A | Discharge status: Home / Self Care | Payer: BC Managed Care – PPO | Source: Ambulatory Visit | Attending: Internal Medicine | Admitting: Internal Medicine

## 2012-07-07 ENCOUNTER — Ambulatory Visit (INDEPENDENT_AMBULATORY_CARE_PROVIDER_SITE_OTHER): Payer: BC Managed Care – PPO | Admitting: Internal Medicine

## 2012-07-07 ENCOUNTER — Encounter: Payer: Self-pay | Admitting: Internal Medicine

## 2012-07-07 ENCOUNTER — Other Ambulatory Visit (INDEPENDENT_AMBULATORY_CARE_PROVIDER_SITE_OTHER): Payer: BC Managed Care – PPO

## 2012-07-07 VITALS — BP 114/64 | HR 92 | Temp 98.4°F | Resp 16 | Wt 247.8 lb

## 2012-07-07 DIAGNOSIS — IMO0001 Reserved for inherently not codable concepts without codable children: Secondary | ICD-10-CM

## 2012-07-07 DIAGNOSIS — IMO0002 Reserved for concepts with insufficient information to code with codable children: Secondary | ICD-10-CM | POA: Insufficient documentation

## 2012-07-07 DIAGNOSIS — M545 Low back pain: Secondary | ICD-10-CM

## 2012-07-07 DIAGNOSIS — M1710 Unilateral primary osteoarthritis, unspecified knee: Secondary | ICD-10-CM

## 2012-07-07 DIAGNOSIS — E1151 Type 2 diabetes mellitus with diabetic peripheral angiopathy without gangrene: Secondary | ICD-10-CM | POA: Insufficient documentation

## 2012-07-07 LAB — URINALYSIS, ROUTINE W REFLEX MICROSCOPIC
Bilirubin Urine: NEGATIVE
Hgb urine dipstick: NEGATIVE
Nitrite: NEGATIVE
Urobilinogen, UA: 0.2 (ref 0.0–1.0)

## 2012-07-07 LAB — COMPREHENSIVE METABOLIC PANEL
ALT: 22 U/L (ref 0–35)
CO2: 26 mEq/L (ref 19–32)
Calcium: 8.9 mg/dL (ref 8.4–10.5)
Chloride: 102 mEq/L (ref 96–112)
Creatinine, Ser: 0.6 mg/dL (ref 0.4–1.2)
GFR: 142.6 mL/min (ref >60.00)
Glucose, Bld: 293 mg/dL — ABNORMAL HIGH (ref 70–99)
Sodium: 135 mEq/L (ref 135–145)
Total Bilirubin: 0.4 mg/dL (ref 0.3–1.2)
Total Protein: 7.3 g/dL (ref 6.0–8.3)

## 2012-07-07 LAB — CBC WITH DIFFERENTIAL/PLATELET
Basophils Absolute: 0.1 10*3/uL (ref 0.0–0.1)
Eosinophils Relative: 1.5 % (ref 0.0–5.0)
HCT: 43 % (ref 36.0–46.0)
Lymphocytes Relative: 29.6 % (ref 12.0–46.0)
Monocytes Relative: 4.8 % (ref 3.0–12.0)
Platelets: 311 10*3/uL (ref 150.0–400.0)
RDW: 14.7 % — ABNORMAL HIGH (ref 11.5–14.6)
WBC: 8.9 10*3/uL (ref 4.5–10.5)

## 2012-07-07 LAB — LIPID PANEL
HDL: 22.8 mg/dL — ABNORMAL LOW (ref >39.00)
Total CHOL/HDL Ratio: 8
Triglycerides: 116 mg/dL (ref 0.0–149.0)
VLDL: 23.2 mg/dL (ref 0.0–40.0)

## 2012-07-07 LAB — HM DIABETES FOOT EXAM: HM Diabetic Foot Exam: NORMAL

## 2012-07-07 LAB — MICROALBUMIN / CREATININE URINE RATIO: Microalb, Ur: 1.1 mg/dL (ref 0.0–1.9)

## 2012-07-07 LAB — HEMOGLOBIN A1C: Hgb A1c MFr Bld: 9.9 % — ABNORMAL HIGH (ref 4.6–6.5)

## 2012-07-07 MED ORDER — HYDROCODONE-ACETAMINOPHEN 5-325 MG PO TABS: ORAL_TABLET | ORAL | Status: DC

## 2012-07-07 MED ORDER — ONETOUCH VERIO IQ SYSTEM W/DEVICE KIT: 1.0000 | PACK | Freq: Two times a day (BID) | Status: DC

## 2012-07-07 MED ORDER — INSULIN DETEMIR 100 UNIT/ML ~~LOC~~ SOLN: 50.0000 [IU] | Freq: Every day | SUBCUTANEOUS | Status: DC

## 2012-07-07 MED ORDER — SITAGLIPTIN PHOS-METFORMIN HCL 50-1000 MG PO TABS: 1.0000 | ORAL_TABLET | Freq: Two times a day (BID) | ORAL | Status: DC

## 2012-07-07 MED ORDER — GLUCOSE BLOOD VI STRP: ORAL_STRIP | Status: DC

## 2012-07-07 NOTE — Assessment & Plan Note (Signed)
I will check her plain films today, she will continue current meds for pain

## 2012-07-07 NOTE — Progress Notes (Signed)
Subjective:    Patient ID: Maureen Price, female    DOB: [birth year], 36 y.o.   MRN: [id number]  Diabetes She presents for her follow-up diabetic visit. She has type 2 diabetes mellitus. The initial diagnosis of diabetes was made 8 years ago. There are no hypoglycemic associated symptoms. Pertinent negatives for hypoglycemia include no dizziness, headaches, pallor, seizures, speech difficulty or tremors. Associated symptoms include polydipsia, polyphagia and polyuria. Pertinent negatives for diabetes include no blurred vision, no chest pain, no fatigue, no foot paresthesias, no foot ulcerations, no visual change, no weakness and no weight loss. There are no hypoglycemic complications. Symptoms are stable. There are no diabetic complications. When asked about current treatments, none were reported. She is compliant with treatment none of the time. Her weight is stable. She is following a generally healthy diet. Meal planning includes avoidance of concentrated sweets. She has not had a previous visit with a dietician. She participates in exercise intermittently. Her home blood glucose trend is increasing steadily. Her breakfast blood glucose range is generally >200 mg/dl. Her lunch blood glucose range is generally >200 mg/dl. Her dinner blood glucose range is generally >200 mg/dl. Her highest blood glucose is >200 mg/dl. Her overall blood glucose range is >200 mg/dl. An ACE inhibitor/angiotensin II receptor blocker is not being taken. She does not see a podiatrist.Eye exam is not current.  Back Pain This is a chronic problem. Episode onset: 4 years ago. The problem occurs constantly. The problem is unchanged. The pain is present in the lumbar spine. The quality of the pain is described as aching. The pain does not radiate. The pain is at a severity of 4/10. The pain is moderate. The pain is worse during the day. The symptoms are aggravated by bending and position. Pertinent negatives include no abdominal  pain, bladder incontinence, bowel incontinence, chest pain, dysuria, fever, headaches, leg pain, numbness, paresis, paresthesias, pelvic pain, perianal numbness, tingling, weakness or weight loss. Risk factors include lack of exercise, recent trauma and obesity. She has tried analgesics, NSAIDs and muscle relaxant for the symptoms. The treatment provided significant relief.      Review of Systems  Constitutional: Negative for fever, chills, weight loss, diaphoresis, activity change, appetite change, fatigue and unexpected weight change.  HENT: Negative.   Eyes: Negative.  Negative for blurred vision.  Respiratory: Negative for cough, chest tightness, shortness of breath, wheezing and stridor.   Cardiovascular: Negative for chest pain, palpitations and leg swelling.  Gastrointestinal: Negative for nausea, vomiting, abdominal pain, constipation, abdominal distention and bowel incontinence.  Genitourinary: Positive for polyuria and frequency. Negative for bladder incontinence, dysuria, urgency, hematuria, flank pain, decreased urine volume, vaginal bleeding, vaginal discharge and pelvic pain.  Musculoskeletal: Positive for back pain and arthralgias (knees). Negative for myalgias, joint swelling and gait problem.  Skin: Negative for color change, pallor, rash and wound.  Neurological: Negative for dizziness, tingling, tremors, seizures, syncope, facial asymmetry, speech difficulty, weakness, light-headedness, numbness, headaches and paresthesias.  Hematological: Positive for polydipsia and polyphagia. Negative for adenopathy. Does not bruise/bleed easily.  Psychiatric/Behavioral: Negative.        Objective:   Physical Exam  Vitals reviewed. Constitutional: She is oriented to person, place, and time. She appears well-developed and well-nourished.  Non-toxic appearance. She does not have a sickly appearance. She does not appear ill. No distress.  HENT:  Head: Normocephalic and atraumatic.    Mouth/Throat: Oropharynx is clear and moist. No oropharyngeal exudate.  Eyes: Conjunctivae normal are normal. Right eye exhibits no  discharge. Left eye exhibits no discharge. No scleral icterus.  Neck: Normal range of motion. Neck supple. No JVD present. No tracheal deviation present. No thyromegaly present.  Cardiovascular: Normal rate, regular rhythm, normal heart sounds and intact distal pulses.  Exam reveals no gallop and no friction rub.   No murmur heard. Pulmonary/Chest: Effort normal and breath sounds normal. No stridor. No respiratory distress. She has no wheezes. She has no rales. She exhibits no tenderness.  Abdominal: Soft. Bowel sounds are normal. She exhibits no distension and no mass. There is no tenderness. There is no rebound and no guarding.  Musculoskeletal: Normal range of motion. She exhibits no edema and no tenderness.  Lymphadenopathy:    She has no cervical adenopathy.  Neurological: She is oriented to person, place, and time.  Skin: Skin is warm and dry. No rash noted. She is not diaphoretic. No erythema. No pallor.  Psychiatric: She has a normal mood and affect. Her behavior is normal. Judgment and thought content normal.      Lab Results  Component Value Date   WBC 10.5 05/19/2012   HGB 13.7 05/19/2012   HCT 39.6 05/19/2012   PLT 308 05/19/2012   GLUCOSE 298* 05/19/2012   ALT 17 05/18/2012   AST 10 05/18/2012   NA 135 05/19/2012   K 3.3* 05/19/2012   CL 100 05/19/2012   CREATININE 0.38* 05/19/2012   BUN 3* 05/19/2012   CO2 25 05/19/2012   INR 1.06 05/18/2012      Assessment & Plan:

## 2012-07-07 NOTE — Patient Instructions (Signed)

## 2012-07-07 NOTE — Assessment & Plan Note (Signed)
Continue current meds (nsaids and vicodin) for pain

## 2012-07-07 NOTE — Assessment & Plan Note (Signed)
Start janumet and levemir, I will check her a1c and renal function today as well as her FLP, will see if she makes much insulin with a c-peptide test, she was referred for diabetic education and ophth.

## 2012-07-08 ENCOUNTER — Encounter: Payer: Self-pay | Admitting: Internal Medicine

## 2012-07-08 LAB — C-PEPTIDE: C-Peptide: 3.57 ng/mL (ref 0.80–3.90)

## 2012-07-18 ENCOUNTER — Telehealth: Payer: Self-pay

## 2012-07-18 NOTE — Telephone Encounter (Signed)
Needs follow up office visit prior to starting any stronger medication

## 2012-07-18 NOTE — Telephone Encounter (Signed)
Patient called c/o continued back, shoulder and neck pain. She was seen 07/07/12 and prescribed hydrocodone 5-325 (1-2 q 6hrs prn) but states that medication is not helping pain she has been needing to take up to 3 tabs every 5 hrs. Patient would like rx for something stronger that will prevent her for having to take so many tabs. Please advise Thanks

## 2012-07-19 NOTE — Telephone Encounter (Signed)
Patient notified per MD and states that she would like to wait for Dr. Yetta Barre to return and see what advisement he may have. Pt aware that MD is out of the office.

## 2012-07-24 NOTE — Telephone Encounter (Signed)
She needs to be seen.

## 2012-07-25 NOTE — Telephone Encounter (Signed)
Patient notified per MD and appt scheduled

## 2012-07-27 ENCOUNTER — Ambulatory Visit (INDEPENDENT_AMBULATORY_CARE_PROVIDER_SITE_OTHER): Payer: BC Managed Care – PPO | Admitting: Internal Medicine

## 2012-07-27 ENCOUNTER — Encounter: Payer: Self-pay | Admitting: Internal Medicine

## 2012-07-27 ENCOUNTER — Ambulatory Visit (INDEPENDENT_AMBULATORY_CARE_PROVIDER_SITE_OTHER)
Admission: RE | Admit: 2012-07-27 | Discharge: 2012-07-27 | Disposition: A | Discharge status: Home / Self Care | Payer: BC Managed Care – PPO | Source: Ambulatory Visit | Attending: Internal Medicine | Admitting: Internal Medicine

## 2012-07-27 VITALS — BP 128/76 | HR 93 | Temp 97.8°F | Resp 16 | Wt 253.0 lb

## 2012-07-27 DIAGNOSIS — M542 Cervicalgia: Secondary | ICD-10-CM

## 2012-07-27 DIAGNOSIS — M545 Low back pain: Secondary | ICD-10-CM

## 2012-07-27 DIAGNOSIS — M1710 Unilateral primary osteoarthritis, unspecified knee: Secondary | ICD-10-CM

## 2012-07-27 MED ORDER — BUPRENORPHINE 15 MCG/HR TD PTWK: 1.0000 | MEDICATED_PATCH | TRANSDERMAL | Status: DC

## 2012-07-27 MED ORDER — CYCLOBENZAPRINE HCL 10 MG PO TABS: 10.0000 mg | ORAL_TABLET | Freq: Three times a day (TID) | ORAL | Status: DC | PRN

## 2012-07-27 NOTE — Assessment & Plan Note (Signed)
I will check plain films of her neck today, she has been taking her mother's flexeril so I gave her an Rx for that today. I also changed her to butrans for better relief of her pain. I have asked her to be seen by pain management as well.

## 2012-07-27 NOTE — Assessment & Plan Note (Signed)
meds changed as noted, I have asked her to see pain management as well

## 2012-07-27 NOTE — Progress Notes (Signed)
Subjective:    Patient ID: Maureen Price, female    DOB: [birth year], 36 y.o.   MRN: [id number]  Neck Pain  This is a recurrent problem. The current episode started 1 to 4 weeks ago. The problem occurs intermittently. The problem has been gradually worsening. The pain is associated with nothing. The pain is present in the right side. The quality of the pain is described as aching. The pain is at a severity of 6/10. The pain is moderate. The symptoms are aggravated by position. The pain is same all the time. Pertinent negatives include no chest pain, fever, headaches, leg pain, numbness, pain with swallowing, paresis, photophobia, syncope, tingling, trouble swallowing, visual change, weakness or weight loss. She has tried oral narcotics, muscle relaxants and NSAIDs (flexeril (mother's supply) and vicodin) for the symptoms. The treatment provided moderate relief.      Review of Systems  Constitutional: Negative for fever, chills, weight loss, diaphoresis, activity change, appetite change, fatigue and unexpected weight change.  HENT: Positive for neck pain. Negative for trouble swallowing.   Eyes: Negative.  Negative for photophobia.  Respiratory: Negative for apnea, cough, choking, chest tightness, shortness of breath, wheezing and stridor.   Cardiovascular: Negative for chest pain, leg swelling and syncope.  Gastrointestinal: Negative for nausea, vomiting, abdominal pain, diarrhea, constipation, blood in stool and anal bleeding.  Genitourinary: Negative.   Musculoskeletal: Positive for back pain (unchanged). Negative for myalgias, joint swelling, arthralgias and gait problem.  Skin: Negative for color change, pallor, rash and wound.  Neurological: Negative for dizziness, tingling, tremors, seizures, syncope, facial asymmetry, speech difficulty, weakness, light-headedness, numbness and headaches.  Hematological: Negative for adenopathy. Does not bruise/bleed easily.  Psychiatric/Behavioral:  Negative.        Objective:   Physical Exam  Vitals reviewed. Constitutional: She is oriented to person, place, and time. She appears well-developed and well-nourished.  Non-toxic appearance. She does not have a sickly appearance. She does not appear ill. No distress.  HENT:  Head: Normocephalic and atraumatic.  Mouth/Throat: Oropharynx is clear and moist. No oropharyngeal exudate.  Eyes: Conjunctivae normal are normal. Right eye exhibits no discharge. Left eye exhibits no discharge. No scleral icterus.  Neck: Normal range of motion. Neck supple. No JVD present. No tracheal deviation present. No thyromegaly present.  Cardiovascular: Normal rate, regular rhythm, normal heart sounds and intact distal pulses.  Exam reveals no gallop and no friction rub.   No murmur heard. Pulmonary/Chest: Effort normal and breath sounds normal. No stridor. No respiratory distress. She has no wheezes. She has no rales. She exhibits no tenderness.  Abdominal: Soft. Bowel sounds are normal. She exhibits no distension and no mass. There is no tenderness. There is no rebound and no guarding.  Musculoskeletal: Normal range of motion. She exhibits no edema and no tenderness.       Cervical back: She exhibits normal range of motion, no tenderness, no bony tenderness, no swelling, no deformity and no spasm.       Lumbar back: Normal. She exhibits normal range of motion, no tenderness, no bony tenderness, no edema and no spasm.  Lymphadenopathy:    She has no cervical adenopathy.  Neurological: She is alert and oriented to person, place, and time. She displays no atrophy, no tremor and normal reflexes. No cranial nerve deficit or sensory deficit. She exhibits normal muscle tone. She displays no seizure activity. Coordination and gait normal. She displays no Babinski's sign on the right side. She displays no Babinski's sign on the  left side.  Reflex Scores:      Tricep reflexes are 1+ on the right side and 1+ on the left  side.      Bicep reflexes are 1+ on the right side and 1+ on the left side.      Brachioradialis reflexes are 1+ on the right side and 1+ on the left side.      Patellar reflexes are 1+ on the right side and 1+ on the left side.      Achilles reflexes are 1+ on the right side and 1+ on the left side.      - SLR in BLE  Skin: Skin is warm and dry. No rash noted. She is not diaphoretic. No erythema. No pallor.  Psychiatric: She has a normal mood and affect. Her behavior is normal. Judgment and thought content normal.      Dg Lumbar Spine Complete  07/07/2012  *RADIOLOGY REPORT*  Clinical Data: Low back pain  LUMBAR SPINE - COMPLETE 4+ VIEW  Comparison: None.  Findings: Five views of the lumbar spine submitted.  No acute fracture or subluxation.  The alignment and vertebral height are preserved.  Minimal anterior spurring noted upper endplate of the L2 ,L3 and L4 vertebral body.  Post cholecystectomy surgical clips are noted in the right upper abdomen.  A surgical clip is noted at the level of the right transverse process of L3 vertebral body.  IMPRESSION: No acute fracture or subluxation.  Minimal degenerative changes.   Original Report Authenticated By: Natasha Mead, M.D.      Assessment & Plan:

## 2012-07-27 NOTE — Patient Instructions (Addendum)
Back Pain, Adult Low back pain is very common. About 1 in 5 people have back pain.The cause of low back pain is rarely dangerous. The pain often gets better over time.About half of people with a sudden onset of back pain feel better in just 2 weeks. About 8 in 10 people feel better by 6 weeks.  CAUSES Some common causes of back pain include:  Strain of the muscles or ligaments supporting the spine.  Wear and tear (degeneration) of the spinal discs.  Arthritis.  Direct injury to the back. DIAGNOSIS Most of the time, the direct cause of low back pain is not known.However, back pain can be treated effectively even when the exact cause of the pain is unknown.Answering your caregiver's questions about your overall health and symptoms is one of the most accurate ways to make sure the cause of your pain is not dangerous. If your caregiver needs more information, he or she may order lab work or imaging tests (X-rays or MRIs).However, even if imaging tests show changes in your back, this usually does not require surgery. HOME CARE INSTRUCTIONS For many people, back pain returns.Since low back pain is rarely dangerous, it is often a condition that people can learn to manageon their own.   Remain active. It is stressful on the back to sit or stand in one place. Do not sit, drive, or stand in one place for more than 30 minutes at a time. Take short walks on level surfaces as soon as pain allows.Try to increase the length of time you walk each day.  Do not stay in bed.Resting more than 1 or 2 days can delay your recovery.  Do not avoid exercise or work.Your body is made to move.It is not dangerous to be active, even though your back may hurt.Your back will likely heal faster if you return to being active before your pain is gone.  Pay attention to your body when you bend and lift. Many people have less discomfortwhen lifting if they bend their knees, keep the load close to their bodies,and  avoid twisting. Often, the most comfortable positions are those that put less stress on your recovering back.  Find a comfortable position to sleep. Use a firm mattress and lie on your side with your knees slightly bent. If you lie on your back, put a pillow under your knees.  Only take over-the-counter or prescription medicines as directed by your caregiver. Over-the-counter medicines to reduce pain and inflammation are often the most helpful.Your caregiver may prescribe muscle relaxant drugs.These medicines help dull your pain so you can more quickly return to your normal activities and healthy exercise.  Put ice on the injured area.  Put ice in a plastic bag.  Place a towel between your skin and the bag.  Leave the ice on for 15 to 20 minutes, 3 to 4 times a day for the first 2 to 3 days. After that, ice and heat may be alternated to reduce pain and spasms.  Ask your caregiver about trying back exercises and gentle massage. This may be of some benefit.  Avoid feeling anxious or stressed.Stress increases muscle tension and can worsen back pain.It is important to recognize when you are anxious or stressed and learn ways to manage it.Exercise is a great option. SEEK MEDICAL CARE IF:  You have pain that is not relieved with rest or medicine.  You have pain that does not improve in 1 week.  You have new symptoms.  You are generally   not feeling well. SEEK IMMEDIATE MEDICAL CARE IF:   You have pain that radiates from your back into your legs.  You develop new bowel or bladder control problems.  You have unusual weakness or numbness in your arms or legs.  You develop nausea or vomiting.  You develop abdominal pain.  You feel faint. Document Released: 08/24/2005 Document Revised: 02/23/2012 Document Reviewed: 01/12/2011 [employer] Patient Information 2013 [employer].  

## 2012-07-27 NOTE — Assessment & Plan Note (Signed)
I will give her her own Rx for flexeril, she will try butrans for pain

## 2012-07-28 ENCOUNTER — Encounter (HOSPITAL_COMMUNITY): Payer: Self-pay | Admitting: Emergency Medicine

## 2012-07-28 ENCOUNTER — Emergency Department (INDEPENDENT_AMBULATORY_CARE_PROVIDER_SITE_OTHER)
Admission: EM | Admit: 2012-07-28 | Discharge: 2012-07-28 | Disposition: A | Discharge status: Home / Self Care | Payer: Worker's Compensation | Source: Home / Self Care | Attending: Family Medicine | Admitting: Family Medicine

## 2012-07-28 DIAGNOSIS — S139XXA Sprain of joints and ligaments of unspecified parts of neck, initial encounter: Secondary | ICD-10-CM

## 2012-07-28 DIAGNOSIS — S161XXA Strain of muscle, fascia and tendon at neck level, initial encounter: Secondary | ICD-10-CM

## 2012-07-28 MED ORDER — DICLOFENAC POTASSIUM 50 MG PO TABS: 50.0000 mg | ORAL_TABLET | Freq: Three times a day (TID) | ORAL | Status: DC

## 2012-07-28 MED ORDER — METAXALONE 800 MG PO TABS: 800.0000 mg | ORAL_TABLET | Freq: Three times a day (TID) | ORAL | Status: DC

## 2012-07-28 NOTE — ED Provider Notes (Signed)
History     CSN: [id number]  Arrival date & time 07/28/12  1702   First MD Initiated Contact with Patient 07/28/12 1750      Chief Complaint  Patient presents with  . Back Pain    pain in upper back between shoulder blades. pain severe with lifting arms above head.    (Consider location/radiation/quality/duration/timing/severity/associated sxs/prior treatment) Patient is a 36 y.o. female presenting with back pain. The history is provided by the patient.  Back Pain  This is a new problem. The current episode started 2 days ago. The problem has been gradually worsening. The pain is associated with no known injury. Pain location: upper back , cervical spine. The quality of the pain is described as stabbing. The pain does not radiate. The pain is mild. The symptoms are aggravated by certain positions (reaching overhead.). Pertinent negatives include no numbness and no weakness. Treatments tried: seen by lmd yest given pain patch and flexeril.had c-spine x-rays done, see report.    Past Medical History  Diagnosis Date  . Diabetes mellitus     2002; no meds since 2004    Past Surgical History  Procedure Date  . Cesarean section   . Gallbladder surgery   . Tubal ligation     2000  . Cesarean section [phone]    Family History  Problem Relation Age of Onset  . Arthritis Mother   . Diabetes Mother   . Heart disease Father   . Stroke Father   . Cancer Neg Hx   . Alcohol abuse Neg Hx   . Early death Neg Hx   . Kidney disease Neg Hx   . Hypertension Neg Hx   . Hyperlipidemia Neg Hx     History  Substance Use Topics  . Smoking status: Current Every Day Smoker -- 1.0 packs/day for 20 years    Types: Cigarettes  . Smokeless tobacco: Not on file  . Alcohol Use: 3.6 oz/week    3 Glasses of wine, 3 Cans of beer per week    OB History    Grav Para Term Preterm Abortions TAB SAB Ect Mult Living   4 3 3  1  1   3       Review of Systems  Constitutional: Negative.     HENT: Positive for neck pain and neck stiffness.   Musculoskeletal: Positive for back pain.  Neurological: Negative for weakness and numbness.    Allergies  Erythromycin and Penicillins  Home Medications   Current Outpatient Rx  Name  Route  Sig  Dispense  Refill  . ONETOUCH VERIO IQ SYSTEM W/DEVICE KIT   Does not apply   1 Act by Does not apply route 2 (two) times daily.   2 kit   0   . BUPRENORPHINE 15 MCG/HR TD PTWK   Transdermal   Place 1 patch onto the skin once a week.   4 patch   5   . VITAMIN B-12 PO   Oral   Take 1 tablet by mouth daily.         . CYCLOBENZAPRINE HCL 10 MG PO TABS   Oral   Take 1 tablet (10 mg total) by mouth 3 (three) times daily as needed for muscle spasms.   50 tablet   5   . DICLOFENAC POTASSIUM 50 MG PO TABS   Oral   Take 1 tablet (50 mg total) by mouth 3 (three) times daily. For neck pain.   30 tablet   0   .  GLUCOSE BLOOD VI STRP      Use BID   100 each   12   . INSULIN DETEMIR 100 UNIT/ML Howe SOLN   Subcutaneous   Inject 50 Units into the skin daily.   10 mL   12   . METAXALONE 800 MG PO TABS   Oral   Take 1 tablet (800 mg total) by mouth 3 (three) times daily. As muscle relaxer.   21 tablet   0   . METFORMIN HCL 1000 MG PO TABS   Oral   Take 1,000 mg by mouth 2 (two) times daily with a meal.         . ADULT MULTIVITAMIN W/MINERALS CH   Oral   Take 1 tablet by mouth daily.         Marland Kitchen ONDANSETRON 8 MG PO TBDP   Oral   Take 1 tablet (8 mg total) by mouth every 8 (eight) hours as needed for nausea.   30 tablet   0   . SITAGLIPTIN-METFORMIN HCL 50-1000 MG PO TABS   Oral   Take 1 tablet by mouth 2 (two) times daily with a meal.   112 tablet   0   . VITAMIN C 500 MG PO TABS   Oral   Take 1,000 mg by mouth daily.           BP 121/76  Pulse 92  Temp 98.8 F (37.1 C) (Oral)  Resp 16  SpO2 97%  LMP 07/27/2012  Physical Exam  Nursing note and vitals reviewed. Constitutional: She is oriented to  person, place, and time. She appears well-developed and well-nourished.  HENT:  Head: Normocephalic.  Musculoskeletal: She exhibits tenderness.       Arms: Neurological: She is alert and oriented to person, place, and time.  Skin: Skin is warm and dry.    ED Course  Procedures (including critical care time)  Labs Reviewed - No data to display Dg Cervical Spine Complete  07/27/2012  *RADIOLOGY REPORT*  Clinical Data: 36 year old female pain radiating caudally.  Neck pain on the right.  CERVICAL SPINE - COMPLETE 4+ VIEW  Comparison: None.  Findings: Straightening/mild reversal of cervical lordosis. Prevertebral soft tissue contours within normal limits.  Relatively preserved disc spaces, but bulky anterior osteophytosis C5-C6. Bilateral posterior element alignment is within normal limits.  AP alignment and lung apices within normal limits.  C1-C2 alignment and odontoid are normal.  IMPRESSION: No acute osseous abnormality in the cervical spine.  Degenerative endplate spurring at C5-C6.   Original Report Authenticated By: Erskine Speed, M.D.      1. Posterolateral cervical muscle strain       MDM  X-rays reviewed and report per radiologist.         Linna Hoff, MD 07/28/12 Ernestina [place]

## 2012-07-28 NOTE — ED Notes (Addendum)
Pt c/o upper back pain between shoulder blades. A lot of pain felt with lifting arms above head. Pt works with [employer].  Pt was seen by her doc. On Wednesday but was unable to perform a duty at work which required her to reach above her head and was told by employer that she needed to come and been seen.

## 2012-10-24 ENCOUNTER — Emergency Department (HOSPITAL_COMMUNITY)
Admission: EM | Admit: 2012-10-24 | Discharge: 2012-10-25 | Disposition: A | Discharge status: Home / Self Care | Payer: BC Managed Care – PPO | Attending: Emergency Medicine | Admitting: Emergency Medicine

## 2012-10-24 ENCOUNTER — Emergency Department (HOSPITAL_COMMUNITY): Payer: BC Managed Care – PPO

## 2012-10-24 DIAGNOSIS — S99929A Unspecified injury of unspecified foot, initial encounter: Secondary | ICD-10-CM | POA: Insufficient documentation

## 2012-10-24 DIAGNOSIS — E119 Type 2 diabetes mellitus without complications: Secondary | ICD-10-CM | POA: Insufficient documentation

## 2012-10-24 DIAGNOSIS — M7650 Patellar tendinitis, unspecified knee: Secondary | ICD-10-CM

## 2012-10-24 DIAGNOSIS — M25562 Pain in left knee: Secondary | ICD-10-CM

## 2012-10-24 DIAGNOSIS — F17200 Nicotine dependence, unspecified, uncomplicated: Secondary | ICD-10-CM | POA: Insufficient documentation

## 2012-10-24 DIAGNOSIS — X500XXA Overexertion from strenuous movement or load, initial encounter: Secondary | ICD-10-CM | POA: Insufficient documentation

## 2012-10-24 DIAGNOSIS — S8990XA Unspecified injury of unspecified lower leg, initial encounter: Secondary | ICD-10-CM | POA: Insufficient documentation

## 2012-10-24 DIAGNOSIS — Y929 Unspecified place or not applicable: Secondary | ICD-10-CM | POA: Insufficient documentation

## 2012-10-24 DIAGNOSIS — Z794 Long term (current) use of insulin: Secondary | ICD-10-CM | POA: Insufficient documentation

## 2012-10-24 DIAGNOSIS — Z79899 Other long term (current) drug therapy: Secondary | ICD-10-CM | POA: Insufficient documentation

## 2012-10-24 DIAGNOSIS — Y9389 Activity, other specified: Secondary | ICD-10-CM | POA: Insufficient documentation

## 2012-10-24 NOTE — ED Provider Notes (Signed)
History  This chart was scribed for non-physician practitioner, Jaynie Crumble, PA working with Olivia Mackie, MD by Shari Heritage, ED Scribe. This patient was seen in room WTR8/WTR8 and the patient's care was started at 2329.   CSN: [id number]  Arrival date & time 10/24/12  2315   First MD Initiated Contact with Patient 10/24/12 2329      Chief Complaint  Patient presents with  . Knee Pain     Patient is a 37 y.o. female presenting with knee pain. The history is provided by the patient. No language interpreter was used.  Knee Pain Location:  Knee Time since incident:  3 weeks Injury: yes   Knee location:  L knee Pain details:    Quality:  Throbbing   Radiates to:  L leg   Severity:  Moderate   Duration:  3 weeks   Timing:  Constant   Progression:  Unchanged Chronicity:  New Foreign body present:  No foreign bodies Relieved by:  Heat Ineffective treatments:  Muscle relaxant Associated symptoms: no numbness and no tingling      HPI Comments: Maureen Price is a 37 y.o. female who presents to the Emergency Department complaining of constant, moderate, throbbing left knee pain with swelling onset 3 weeks ago. Patient states that pain is now radiating down her leg to the foot. Pain is worse with ambulation and bending at the knee. No numbness, tingling or weakness to the left lower extremity. Patient states that she twisted the knee while chasing her children. She has been taking muscle relaxants and applying ice to the area at home. She also states that she has been soaking the knee in Epsom salts and hot water which relieves pain transiently. Patient has a medical history of diabetes.    Past Medical History  Diagnosis Date  . Diabetes mellitus     2002; no meds since 2004    Past Surgical History  Procedure Laterality Date  . Cesarean section    . Gallbladder surgery    . Tubal ligation      2000  . Cesarean section  [phone]    Family History   Problem Relation Age of Onset  . Arthritis Mother   . Diabetes Mother   . Heart disease Father   . Stroke Father   . Cancer Neg Hx   . Alcohol abuse Neg Hx   . Early death Neg Hx   . Kidney disease Neg Hx   . Hypertension Neg Hx   . Hyperlipidemia Neg Hx     History  Substance Use Topics  . Smoking status: Current Every Day Smoker -- 1.00 packs/day for 20 years    Types: Cigarettes  . Smokeless tobacco: Not on file  . Alcohol Use: 3.6 oz/week    3 Glasses of wine, 3 Cans of beer per week    OB History   Grav Para Term Preterm Abortions TAB SAB Ect Mult Living   4 3 3  1  1   3       Review of Systems  Neurological: Negative for weakness.  All other systems reviewed and are negative.    Allergies  Erythromycin and Penicillins  Home Medications   Current Outpatient Rx  Name  Route  Sig  Dispense  Refill  . Blood Glucose Monitoring Suppl (ONETOUCH VERIO IQ SYSTEM) W/DEVICE KIT   Does not apply   1 Act by Does not apply route 2 (two) times daily.   2 kit  0   . Buprenorphine (BUTRANS) 15 MCG/HR PTWK   Transdermal   Place 1 patch onto the skin once a week.   4 patch   5   . Cyanocobalamin (VITAMIN B-12 PO)   Oral   Take 1 tablet by mouth daily.         . cyclobenzaprine (FLEXERIL) 10 MG tablet   Oral   Take 1 tablet (10 mg total) by mouth 3 (three) times daily as needed for muscle spasms.   50 tablet   5   . diclofenac (CATAFLAM) 50 MG tablet   Oral   Take 1 tablet (50 mg total) by mouth 3 (three) times daily. For neck pain.   30 tablet   0   . glucose blood (ONETOUCH VERIO IQ) test strip      Use BID   100 each   12   . insulin detemir (LEVEMIR FLEXPEN) 100 UNIT/ML injection   Subcutaneous   Inject 50 Units into the skin daily.   10 mL   12   . metaxalone (SKELAXIN) 800 MG tablet   Oral   Take 1 tablet (800 mg total) by mouth 3 (three) times daily. As muscle relaxer.   21 tablet   0   . metFORMIN (GLUCOPHAGE) 1000 MG tablet    Oral   Take 1,000 mg by mouth 2 (two) times daily with a meal.         . Multiple Vitamin (MULITIVITAMIN WITH MINERALS) TABS   Oral   Take 1 tablet by mouth daily.         . ondansetron (ZOFRAN ODT) 8 MG disintegrating tablet   Oral   Take 1 tablet (8 mg total) by mouth every 8 (eight) hours as needed for nausea.   30 tablet   0   . sitaGLIPtan-metformin (JANUMET) 50-1000 MG per tablet   Oral   Take 1 tablet by mouth 2 (two) times daily with a meal.   112 tablet   0   . vitamin C (ASCORBIC ACID) 500 MG tablet   Oral   Take 1,000 mg by mouth daily.           Triage Vitals: BP 115/76  Pulse 88  Temp(Src) 98.3 F (36.8 C) (Oral)  SpO2 100%  LMP 10/16/2012  Physical Exam  Constitutional: She is oriented to person, place, and time. She appears well-developed and well-nourished. No distress.  HENT:  Head: Normocephalic and atraumatic.  Musculoskeletal: She exhibits tenderness.       Left knee: She exhibits decreased range of motion. She exhibits no LCL laxity and no MCL laxity. Tenderness found. Medial joint line, lateral joint line and patellar tendon tenderness noted.  Medial and lateral joint tenderness of the left knee. No posterior knee tenderness. Negative anterior/posterior drawer signs. No laxity or pain with medial or lateral stress. Tenderness over the patellar tendon on the left. Patellar tendon is intact. Pain with knee flexion against resistance. Decreased ROM due to pain.  Neurological: She is alert and oriented to person, place, and time.  Skin: Skin is warm and dry.  Psychiatric: She has a normal mood and affect. Her behavior is normal.    ED Course  Procedures (including critical care time) DIAGNOSTIC STUDIES: Oxygen Saturation is 100% on room air, normal by my interpretation.    COORDINATION OF CARE: 11:45 PM- Patient informed of current plan for treatment and evaluation and agrees with plan at this time.    Dg Knee Complete 4 Views  Left  10/25/2012  *RADIOLOGY REPORT*  Clinical Data: Increasing patellar pain status post fall 3 weeks ago  LEFT KNEE - COMPLETE 4+ VIEW  Comparison: None  Findings: No acute fracture or malalignment.  Small suprapatellar joint effusion.  Incidental note is made of a fabella.  Normal bony mineralization.  IMPRESSION: 1.  Small suprapatellar joint effusion. 2.  No acute osseous abnormality.   Original Report Authenticated By: Malachy Moan, M.D.       Labs Reviewed - No data to display No results found.   1. Knee pain, left   2. Patellar tendonitis       MDM  Pt with left knee injury 3 wks ago. X-ray suspicious for patella tendonitis. Pt has limmited ROM of the joint, presumably from pain. Unable to get good exam due to pain. X-ray shows small suprapatellar joint effusion. Pt given knee sleeve. mobic and ultram at home for pain. Follow up with your doctor.      I personally performed the services described in this documentation, which was scribed in my presence. The recorded information has been reviewed and is accurate.    Lottie Mussel, [place] 10/25/12 [phone]

## 2012-10-24 NOTE — ED Notes (Signed)
Pt states she twisted her knee several days ago and has had pain since and has trouble bearing weight. Swollen.

## 2012-10-25 MED ORDER — TRAMADOL HCL 50 MG PO TABS: 50.0000 mg | ORAL_TABLET | Freq: Four times a day (QID) | ORAL | Status: DC | PRN

## 2012-10-25 MED ORDER — MELOXICAM 15 MG PO TABS: 15.0000 mg | ORAL_TABLET | Freq: Every day | ORAL | Status: DC

## 2012-10-25 NOTE — ED Provider Notes (Signed)
Medical screening examination/treatment/procedure(s) were performed by non-physician practitioner and as supervising physician I was immediately available for consultation/collaboration.  Leticia Coletta M Deyonte Cadden, MD 10/25/12 0409 

## 2012-11-22 ENCOUNTER — Emergency Department (HOSPITAL_COMMUNITY)
Admission: EM | Admit: 2012-11-22 | Discharge: 2012-11-22 | Disposition: A | Discharge status: Home / Self Care | Payer: BC Managed Care – PPO | Attending: Emergency Medicine | Admitting: Emergency Medicine

## 2012-11-22 ENCOUNTER — Emergency Department (HOSPITAL_COMMUNITY): Payer: BC Managed Care – PPO

## 2012-11-22 ENCOUNTER — Encounter (HOSPITAL_COMMUNITY): Payer: Self-pay | Admitting: Emergency Medicine

## 2012-11-22 DIAGNOSIS — H538 Other visual disturbances: Secondary | ICD-10-CM | POA: Insufficient documentation

## 2012-11-22 DIAGNOSIS — H53149 Visual discomfort, unspecified: Secondary | ICD-10-CM | POA: Insufficient documentation

## 2012-11-22 DIAGNOSIS — F17200 Nicotine dependence, unspecified, uncomplicated: Secondary | ICD-10-CM | POA: Insufficient documentation

## 2012-11-22 DIAGNOSIS — Z794 Long term (current) use of insulin: Secondary | ICD-10-CM | POA: Insufficient documentation

## 2012-11-22 DIAGNOSIS — R51 Headache: Secondary | ICD-10-CM

## 2012-11-22 DIAGNOSIS — R5381 Other malaise: Secondary | ICD-10-CM | POA: Insufficient documentation

## 2012-11-22 DIAGNOSIS — Z79899 Other long term (current) drug therapy: Secondary | ICD-10-CM | POA: Insufficient documentation

## 2012-11-22 DIAGNOSIS — R55 Syncope and collapse: Secondary | ICD-10-CM | POA: Insufficient documentation

## 2012-11-22 DIAGNOSIS — E119 Type 2 diabetes mellitus without complications: Secondary | ICD-10-CM | POA: Insufficient documentation

## 2012-11-22 LAB — POCT I-STAT, CHEM 8
BUN: 5 mg/dL — ABNORMAL LOW (ref 6–23)
Chloride: 102 mEq/L (ref 96–112)
Creatinine, Ser: 0.4 mg/dL — ABNORMAL LOW (ref 0.50–1.10)
Hemoglobin: 15 g/dL (ref 12.0–15.0)
Potassium: 3.9 mEq/L (ref 3.5–5.1)
Sodium: 137 mEq/L (ref 135–145)

## 2012-11-22 LAB — CBC WITH DIFFERENTIAL/PLATELET
Eosinophils Absolute: 0.2 10*3/uL (ref 0.0–0.7)
Eosinophils Relative: 3 % (ref 0–5)
HCT: 41 % (ref 36.0–46.0)
Hemoglobin: 15 g/dL (ref 12.0–15.0)
Lymphs Abs: 3.3 10*3/uL (ref 0.7–4.0)
MCH: 28.1 pg (ref 26.0–34.0)
MCV: 76.8 fL — ABNORMAL LOW (ref 78.0–100.0)
Monocytes Absolute: 0.6 10*3/uL (ref 0.1–1.0)
Monocytes Relative: 7 % (ref 3–12)
RBC: 5.34 MIL/uL — ABNORMAL HIGH (ref 3.87–5.11)

## 2012-11-22 LAB — PROTIME-INR: INR: 1.02 (ref 0.00–1.49)

## 2012-11-22 MED ORDER — DIPHENHYDRAMINE HCL 50 MG/ML IJ SOLN
25.0000 mg | Freq: Once | INTRAMUSCULAR | Status: AC
  Administered 2012-11-22: 25 mg via INTRAVENOUS
  Filled 2012-11-22: qty 1

## 2012-11-22 MED ORDER — ONDANSETRON HCL 4 MG/2ML IJ SOLN
4.0000 mg | Freq: Once | INTRAMUSCULAR | Status: AC
  Administered 2012-11-22: 4 mg via INTRAVENOUS
  Filled 2012-11-22: qty 2

## 2012-11-22 MED ORDER — METOCLOPRAMIDE HCL 5 MG/ML IJ SOLN
10.0000 mg | Freq: Once | INTRAMUSCULAR | Status: AC
  Administered 2012-11-22: 10 mg via INTRAVENOUS
  Filled 2012-11-22: qty 2

## 2012-11-22 MED ORDER — HYDROMORPHONE HCL PF 1 MG/ML IJ SOLN
1.0000 mg | Freq: Once | INTRAMUSCULAR | Status: AC
  Administered 2012-11-22: 1 mg via INTRAVENOUS
  Filled 2012-11-22: qty 1

## 2012-11-22 MED ORDER — DEXAMETHASONE SODIUM PHOSPHATE 10 MG/ML IJ SOLN
10.0000 mg | Freq: Once | INTRAMUSCULAR | Status: AC
  Administered 2012-11-22: 10 mg via INTRAVENOUS
  Filled 2012-11-22: qty 1

## 2012-11-22 NOTE — ED Provider Notes (Addendum)
History     CSN: [id number]  Arrival date & time 11/22/12  1552   First MD Initiated Contact with Patient 11/22/12 1612      Chief Complaint  Patient presents with  . Headache    (Consider location/radiation/quality/duration/timing/severity/associated sxs/prior treatment) Patient is a 37 y.o. female presenting with headaches. The history is provided by the patient.  Headache Pain location:  L parietal Quality:  Sharp and stabbing Radiates to:  L neck Severity currently:  10/10 Severity at highest:  10/10 Onset quality:  Sudden Duration:  2 hours Timing:  Constant Progression:  Unchanged Chronicity:  New Similar to prior headaches: no   Context: bright light   Context: not eating, not loud noise and not straining   Relieved by:  Nothing Worsened by:  Nothing tried Ineffective treatments:  None tried Associated symptoms: blurred vision, focal weakness, near-syncope, photophobia and weakness   Associated symptoms: no abdominal pain, no congestion, no cough, no fever, no nausea, no sore throat and no vomiting   Associated symptoms comment:  States she feels mildly weak in the left arm with some tingling in her tongue and lips Risk factors: no family hx of SAH     Past Medical History  Diagnosis Date  . Diabetes mellitus     2002; no meds since 2004    Past Surgical History  Procedure Laterality Date  . Cesarean section    . Gallbladder surgery    . Tubal ligation      2000  . Cesarean section  [phone]    Family History  Problem Relation Age of Onset  . Arthritis Mother   . Diabetes Mother   . Heart disease Father   . Stroke Father   . Cancer Neg Hx   . Alcohol abuse Neg Hx   . Early death Neg Hx   . Kidney disease Neg Hx   . Hypertension Neg Hx   . Hyperlipidemia Neg Hx     History  Substance Use Topics  . Smoking status: Current Every Day Smoker -- 1.00 packs/day for 20 years    Types: Cigarettes  . Smokeless tobacco: Not on file  . Alcohol  Use: 3.6 oz/week    3 Glasses of wine, 3 Cans of beer per week    OB History   Grav Para Term Preterm Abortions TAB SAB Ect Mult Living   4 3 3  1  1   3       Review of Systems  Constitutional: Negative for fever.  HENT: Negative for congestion and sore throat.   Eyes: Positive for blurred vision and photophobia.  Respiratory: Negative for cough.   Cardiovascular: Positive for near-syncope.  Gastrointestinal: Negative for nausea, vomiting and abdominal pain.  Neurological: Positive for focal weakness and headaches.  All other systems reviewed and are negative.    Allergies  Erythromycin and Penicillins  Home Medications   Current Outpatient Rx  Name  Route  Sig  Dispense  Refill  . APAP-Mag Salicylate-Caffeine (BACK PAIN-OFF PO)   Oral   Take 2 tablets by mouth daily.         . Cyanocobalamin (VITAMIN B-12 PO)   Oral   Take 1 tablet by mouth daily.         . cyclobenzaprine (FLEXERIL) 10 MG tablet   Oral   Take 1 tablet (10 mg total) by mouth 3 (three) times daily as needed for muscle spasms.   50 tablet   5   . guaiFENesin (MUCINEX)  600 MG 12 hr tablet   Oral   Take 600 mg by mouth daily as needed for congestion.         . insulin detemir (LEVEMIR FLEXPEN) 100 UNIT/ML injection   Subcutaneous   Inject 50 Units into the skin daily.   10 mL   12   . metaxalone (SKELAXIN) 800 MG tablet   Oral   Take 1 tablet (800 mg total) by mouth 3 (three) times daily. As muscle relaxer.   21 tablet   0   . Multiple Vitamin (MULITIVITAMIN WITH MINERALS) TABS   Oral   Take 1 tablet by mouth daily.         . sitaGLIPtan-metformin (JANUMET) 50-1000 MG per tablet   Oral   Take 1 tablet by mouth 2 (two) times daily with a meal.   112 tablet   0   . vitamin C (ASCORBIC ACID) 500 MG tablet   Oral   Take 1,000 mg by mouth daily.           BP 132/84  Pulse 99  Temp(Src) 98.5 F (36.9 C) (Oral)  Resp 17  SpO2 100%  LMP 11/13/2012  Physical Exam   Nursing note and vitals reviewed. Constitutional: She is oriented to person, place, and time. She appears well-developed and well-nourished. She appears distressed.  tearful  HENT:  Head: Normocephalic and atraumatic.  Right Ear: Tympanic membrane and ear canal normal.  Left Ear: Tympanic membrane and ear canal normal.  Mouth/Throat: Oropharynx is clear and moist.  Eyes: Conjunctivae and EOM are normal. Pupils are equal, round, and reactive to light.  Photophobia  Neck: Normal range of motion. Neck supple.  Cardiovascular: Normal rate, regular rhythm and intact distal pulses.   No murmur heard. Pulmonary/Chest: Effort normal and breath sounds normal. No respiratory distress. She has no wheezes. She has no rales.  Abdominal: Soft. She exhibits no distension. There is no tenderness. There is no rebound and no guarding.  Musculoskeletal: Normal range of motion. She exhibits no edema and no tenderness.  Neurological: She is alert and oriented to person, place, and time. No cranial nerve deficit.  Mild decreased strength in left bicep and hand grip compared to the right. Bilateral lower extremities with normal strength.  Photophobia and difficult to perform funduscopic exam.  Mild decrease in sensation over the lips but no facial droop  Skin: Skin is warm and dry. No rash noted. No erythema.  Psychiatric: She has a normal mood and affect. Her behavior is normal.    ED Course  Procedures (including critical care time)  Labs Reviewed  CBC WITH DIFFERENTIAL - Abnormal; Notable for the following:    RBC 5.34 (*)    MCV 76.8 (*)    MCHC 36.6 (*)    All other components within normal limits  GLUCOSE, CAPILLARY - Abnormal; Notable for the following:    Glucose-Capillary 342 (*)    All other components within normal limits  POCT I-STAT, CHEM 8 - Abnormal; Notable for the following:    BUN 5 (*)    Creatinine, Ser 0.40 (*)    Glucose, Bld 415 (*)    All other components within normal limits   PROTIME-INR  APTT   Ct Head Wo Contrast  11/22/2012  *RADIOLOGY REPORT*  Clinical Data:  Sudden onset left sided headache, blurred vision and numbness in the arms, fingers and left side of the body.  CT HEAD WITHOUT CONTRAST  Technique: Contiguous axial images were obtained from the  base of the skull through the vertex without contrast.  Comparison: None.  Findings: Normal appearing cerebral hemispheres and posterior fossa structures.  Normal size and position of the ventricles.  No intracranial hemorrhage, mass lesion or evidence of acute infarction.  Unremarkable bones and included portions of the paranasal sinuses.  IMPRESSION: Normal examination.   Original Report Authenticated By: Beckie Salts, M.D.      1. Headache       MDM   Patient with a history of abrupt onset of headache with near syncope that started today while she was in the shower. She states it felt like her tongue and lip started to swell with some facial droop and left arm tingling. She denies history of headaches and no prior history of migraines. She denies taking any type of birth control pill and no history of blood clots. She states this is the worst headache of her life and given the sudden onset with near syncope concern for subarachnoid hemorrhage. Patient's headache started 2 hours ago so feel a CT should be fairly accurate in detecting a subarachnoid hemorrhage. Patient given a headache cocktail. CBC, coags, i-STAT chem 8 ordered.    6:07 PM Labs are within normal limits other than hyperglycemia. Head CT is negative for acute findings. Patient came in within 2 hours of her headaches starting and feel that a negative CT is fairly sensitive and specific for ruling out a subarachnoid bleed. Discussed with the patient the possibility of doing an LP to 100% rule out subarachnoid hemorrhage however patient refuses LP at this time. Her headache is improving after the headache cocktail now as a 7/10 from a 10 out of 10. Will  get a second dose of pain control and recheck.  7:20 PM Pt feeling much better and requesting to go home.      Gwyneth Sprout, MD 11/22/12 1610  Gwyneth Sprout, MD 11/22/12 9604  Gwyneth Sprout, MD 11/22/12 5409

## 2012-11-22 NOTE — ED Notes (Signed)
Medications given over 2-3 minutes each; no immediate adverse reactions noted at this time; pt alert and mentating appropriately; NAD noted at this time; pt denies difficulty breathing

## 2012-11-22 NOTE — ED Notes (Signed)
Pt alert and mentating appropriately upon d/c teaching; pt given d/c teaching and follow up care instructions; pt verbalizes understanding of d/c teaching and has no further questions upon d/c teaching; pt does not show signs of acute distress upon d/c; pt instructed not to drive home, pt endorses that her husband will be driving her home. Pt taken to waiting room via wheelchair by Duwayne Heck, EMT.

## 2012-11-22 NOTE — ED Notes (Signed)
Per EMS pt called because of tongue swelling and lip swelling, and left sided. Pt speaks clearly, no resp distress, and no known allergies to any foods. Pt does have allergies to erythromycin penicillin. Pt CBG was 580 and b/p from 200-124 sys. Pt has hx headaches, and DM.

## 2012-11-22 NOTE — ED Notes (Signed)
Pt states in shower around 2pm this afternoon; pt states sudden onset of headache on left side; on set of blurred vision and numbness in arms and fingers; pt c/o dizziness and lightheadedness; pt denies nausea and vomiting at this time; pt alert and mentating appropriately; pt states tongue and lips became swollen after shower; pt denies difficulty breathing at this time; NAD noted currently.

## 2012-12-07 ENCOUNTER — Emergency Department (HOSPITAL_COMMUNITY)
Admission: EM | Admit: 2012-12-07 | Discharge: 2012-12-07 | Disposition: A | Discharge status: Home / Self Care | Payer: BC Managed Care – PPO | Attending: Emergency Medicine | Admitting: Emergency Medicine

## 2012-12-07 ENCOUNTER — Encounter (HOSPITAL_COMMUNITY): Payer: Self-pay | Admitting: Family Medicine

## 2012-12-07 DIAGNOSIS — Z794 Long term (current) use of insulin: Secondary | ICD-10-CM | POA: Insufficient documentation

## 2012-12-07 DIAGNOSIS — R220 Localized swelling, mass and lump, head: Secondary | ICD-10-CM | POA: Insufficient documentation

## 2012-12-07 DIAGNOSIS — K029 Dental caries, unspecified: Secondary | ICD-10-CM | POA: Insufficient documentation

## 2012-12-07 DIAGNOSIS — K047 Periapical abscess without sinus: Secondary | ICD-10-CM

## 2012-12-07 DIAGNOSIS — F17200 Nicotine dependence, unspecified, uncomplicated: Secondary | ICD-10-CM | POA: Insufficient documentation

## 2012-12-07 DIAGNOSIS — E119 Type 2 diabetes mellitus without complications: Secondary | ICD-10-CM | POA: Insufficient documentation

## 2012-12-07 DIAGNOSIS — K1370 Unspecified lesions of oral mucosa: Secondary | ICD-10-CM | POA: Insufficient documentation

## 2012-12-07 DIAGNOSIS — K089 Disorder of teeth and supporting structures, unspecified: Secondary | ICD-10-CM | POA: Insufficient documentation

## 2012-12-07 DIAGNOSIS — Z79899 Other long term (current) drug therapy: Secondary | ICD-10-CM | POA: Insufficient documentation

## 2012-12-07 MED ORDER — OXYCODONE-ACETAMINOPHEN 5-325 MG PO TABS
2.0000 | ORAL_TABLET | Freq: Once | ORAL | Status: AC
  Administered 2012-12-07: 2 via ORAL
  Filled 2012-12-07: qty 2

## 2012-12-07 MED ORDER — OXYCODONE-ACETAMINOPHEN 5-325 MG PO TABS: 1.0000 | ORAL_TABLET | Freq: Four times a day (QID) | ORAL | Status: DC | PRN

## 2012-12-07 MED ORDER — CLINDAMYCIN HCL 300 MG PO CAPS: 300.0000 mg | ORAL_CAPSULE | Freq: Three times a day (TID) | ORAL | Status: DC

## 2012-12-07 MED ORDER — CLINDAMYCIN HCL 300 MG PO CAPS
300.0000 mg | ORAL_CAPSULE | Freq: Once | ORAL | Status: AC
  Administered 2012-12-07: 300 mg via ORAL
  Filled 2012-12-07: qty 1

## 2012-12-07 NOTE — ED Provider Notes (Signed)
Medical screening examination/treatment/procedure(s) were performed by non-physician practitioner and as supervising physician I was immediately available for consultation/collaboration.   Shelda Jakes, MD 12/07/12 [id number]

## 2012-12-07 NOTE — ED Provider Notes (Signed)
History     CSN: [id number]  Arrival date & time 12/07/12  4098   First MD Initiated Contact with Patient 12/07/12 0302      Chief Complaint  Patient presents with  . Dental Pain  . Abscess    (Consider location/radiation/quality/duration/timing/severity/associated sxs/prior treatment) HPI Comments: Patient reports several broken and decayed teeth on the right upper jaw, line.  She's had intermittent swelling to her face over the past few days.  She's been taking over-the-counter medication without any relief.  She does not have a dentist  Patient is a 37 y.o. female presenting with tooth pain and abscess. The history is provided by the patient.  Dental PainThe primary symptoms include mouth pain. Primary symptoms do not include dental injury, oral bleeding, oral lesions, headaches or fever. The symptoms began more than 1 month ago. The symptoms are worsening. The symptoms occur intermittently.  Additional symptoms include: gum swelling, gum tenderness and facial swelling. Additional symptoms do not include: purulent gums, trismus, trouble swallowing and drooling.   Abscess Associated symptoms: no fever, no headaches, no nausea and no vomiting     Past Medical History  Diagnosis Date  . Diabetes mellitus     2002; no meds since 2004    Past Surgical History  Procedure Laterality Date  . Cesarean section    . Gallbladder surgery    . Tubal ligation      2000  . Cesarean section  [phone]    Family History  Problem Relation Age of Onset  . Arthritis Mother   . Diabetes Mother   . Heart disease Father   . Stroke Father   . Cancer Neg Hx   . Alcohol abuse Neg Hx   . Early death Neg Hx   . Kidney disease Neg Hx   . Hypertension Neg Hx   . Hyperlipidemia Neg Hx     History  Substance Use Topics  . Smoking status: Current Every Day Smoker -- 1.00 packs/day for 20 years    Types: Cigarettes  . Smokeless tobacco: Not on file  . Alcohol Use: No    OB History     Grav Para Term Preterm Abortions TAB SAB Ect Mult Living   4 3 3  1  1   3       Review of Systems  Constitutional: Negative for fever and chills.  HENT: Positive for facial swelling and dental problem. Negative for drooling, mouth sores and trouble swallowing.   Gastrointestinal: Negative for nausea and vomiting.  Neurological: Negative for headaches.  All other systems reviewed and are negative.    Allergies  Erythromycin and Penicillins  Home Medications   Current Outpatient Rx  Name  Route  Sig  Dispense  Refill  . APAP-Mag Salicylate-Caffeine (BACK PAIN-OFF PO)   Oral   Take 2 tablets by mouth daily as needed (for back pain).          . Cyanocobalamin (VITAMIN B-12 PO)   Oral   Take 1 tablet by mouth daily.         . cyclobenzaprine (FLEXERIL) 10 MG tablet   Oral   Take 1 tablet (10 mg total) by mouth 3 (three) times daily as needed for muscle spasms.   50 tablet   5   . guaiFENesin (MUCINEX) 600 MG 12 hr tablet   Oral   Take 600 mg by mouth daily as needed for congestion.         Marland Kitchen Histamine Dihydrochloride (AUSTRALIAN DREAM ARTHRITIS  EX)   Apply externally   Apply 2 application topically 2 (two) times daily as needed (for pain).         . insulin detemir (LEVEMIR FLEXPEN) 100 UNIT/ML injection   Subcutaneous   Inject 50 Units into the skin daily.   10 mL   12   . Multiple Vitamin (MULITIVITAMIN WITH MINERALS) TABS   Oral   Take 1 tablet by mouth daily.         . sitaGLIPtan-metformin (JANUMET) 50-1000 MG per tablet   Oral   Take 1 tablet by mouth 2 (two) times daily with a meal.   112 tablet   0   . vitamin C (ASCORBIC ACID) 500 MG tablet   Oral   Take 1,000 mg by mouth daily.         . clindamycin (CLEOCIN) 300 MG capsule   Oral   Take 1 capsule (300 mg total) by mouth 3 (three) times daily.   30 capsule   0   . oxyCODONE-acetaminophen (PERCOCET/ROXICET) 5-325 MG per tablet   Oral   Take 1 tablet by mouth every 6 (six) hours  as needed for pain.   12 tablet   0     BP 129/83  Pulse 98  Temp(Src) 98.6 F (37 C) (Oral)  Resp 20  SpO2 96%  LMP 11/13/2012  Physical Exam  Constitutional: She is oriented to person, place, and time. She appears well-developed and well-nourished.  HENT:  Head: Normocephalic.  Mouth/Throat:    I don't perceive any facial swelling.  At this time, although the patient is morbidly obese, and would be difficult to tell at best  Eyes: Pupils are equal, round, and reactive to light.  Neck: Normal range of motion.  Cardiovascular: Normal rate.   Pulmonary/Chest: Effort normal.  Musculoskeletal: Normal range of motion.  Lymphadenopathy:    She has no cervical adenopathy.  Neurological: She is alert and oriented to person, place, and time.  Skin: Skin is warm.    ED Course  Procedures (including critical care time)  Labs Reviewed - No data to display No results found.   1. Dental caries   2. Dental abscess       MDM   We'll treat with clindamycin due to patient's allergies to antibiotics.  Pain, control, we'll refer to dentist that she is instructed to call in several hours to be seen for definitive dental care        Arman Filter, NP 12/07/12 0321  Arman Filter, NP 12/07/12 1610  Arman Filter, NP 12/07/12 [id number]

## 2012-12-07 NOTE — ED Notes (Signed)
Patient states that she has a dental abscess.  States that she 3 broken teeth. One of her teeth has a temporary filling and that's where abscess is. States that the right side of her face has been swelling off and on for the past few days.

## 2013-02-02 ENCOUNTER — Emergency Department (HOSPITAL_COMMUNITY): Payer: BC Managed Care – PPO

## 2013-02-02 ENCOUNTER — Encounter (HOSPITAL_COMMUNITY): Payer: Self-pay

## 2013-02-02 ENCOUNTER — Emergency Department (HOSPITAL_COMMUNITY)
Admission: EM | Admit: 2013-02-02 | Discharge: 2013-02-02 | Disposition: A | Discharge status: Home / Self Care | Payer: BC Managed Care – PPO | Attending: Emergency Medicine | Admitting: Emergency Medicine

## 2013-02-02 DIAGNOSIS — Z79899 Other long term (current) drug therapy: Secondary | ICD-10-CM | POA: Insufficient documentation

## 2013-02-02 DIAGNOSIS — Z3202 Encounter for pregnancy test, result negative: Secondary | ICD-10-CM | POA: Insufficient documentation

## 2013-02-02 DIAGNOSIS — Z880 Allergy status to penicillin: Secondary | ICD-10-CM | POA: Insufficient documentation

## 2013-02-02 DIAGNOSIS — R51 Headache: Secondary | ICD-10-CM | POA: Insufficient documentation

## 2013-02-02 DIAGNOSIS — E119 Type 2 diabetes mellitus without complications: Secondary | ICD-10-CM | POA: Insufficient documentation

## 2013-02-02 DIAGNOSIS — R209 Unspecified disturbances of skin sensation: Secondary | ICD-10-CM | POA: Insufficient documentation

## 2013-02-02 DIAGNOSIS — F17200 Nicotine dependence, unspecified, uncomplicated: Secondary | ICD-10-CM | POA: Insufficient documentation

## 2013-02-02 DIAGNOSIS — R079 Chest pain, unspecified: Secondary | ICD-10-CM | POA: Insufficient documentation

## 2013-02-02 DIAGNOSIS — Z794 Long term (current) use of insulin: Secondary | ICD-10-CM | POA: Insufficient documentation

## 2013-02-02 DIAGNOSIS — R110 Nausea: Secondary | ICD-10-CM | POA: Insufficient documentation

## 2013-02-02 DIAGNOSIS — H538 Other visual disturbances: Secondary | ICD-10-CM | POA: Insufficient documentation

## 2013-02-02 LAB — CBC WITH DIFFERENTIAL/PLATELET
Eosinophils Absolute: 0.1 10*3/uL (ref 0.0–0.7)
Hemoglobin: 13.7 g/dL (ref 12.0–15.0)
Lymphs Abs: 3.8 10*3/uL (ref 0.7–4.0)
MCH: 27.6 pg (ref 26.0–34.0)
Monocytes Relative: 6 % (ref 3–12)
Neutrophils Relative %: 56 % (ref 43–77)
RBC: 4.97 MIL/uL (ref 3.87–5.11)

## 2013-02-02 LAB — POCT PREGNANCY, URINE: Preg Test, Ur: NEGATIVE

## 2013-02-02 LAB — BASIC METABOLIC PANEL
BUN: 5 mg/dL — ABNORMAL LOW (ref 6–23)
Chloride: 98 mEq/L (ref 96–112)
Glucose, Bld: 272 mg/dL — ABNORMAL HIGH (ref 70–99)
Potassium: 3.5 mEq/L (ref 3.5–5.1)

## 2013-02-02 LAB — D-DIMER, QUANTITATIVE: D-Dimer, Quant: 0.37 ug/mL-FEU (ref 0.00–0.48)

## 2013-02-02 MED ORDER — SODIUM CHLORIDE 0.9 % IV BOLUS (SEPSIS)
500.0000 mL | Freq: Once | INTRAVENOUS | Status: AC
  Administered 2013-02-02: 500 mL via INTRAVENOUS

## 2013-02-02 MED ORDER — ONDANSETRON HCL 4 MG/2ML IJ SOLN
4.0000 mg | Freq: Once | INTRAMUSCULAR | Status: AC
  Administered 2013-02-02: 4 mg via INTRAVENOUS
  Filled 2013-02-02: qty 2

## 2013-02-02 MED ORDER — MORPHINE SULFATE 4 MG/ML IJ SOLN
4.0000 mg | Freq: Once | INTRAMUSCULAR | Status: AC
  Administered 2013-02-02: 4 mg via INTRAVENOUS
  Filled 2013-02-02: qty 1

## 2013-02-02 NOTE — ED Notes (Signed)
Sudden onset left sided chest pain radiating down left arm with SOB, nausea, no vomiting, 10/10, some tenderness on left chest with palpation pressure pain per EMS report. EMS administered 4 aspirin 81mg  tablets for a total of 324mg  and 3 nitroglycerin per EMS. 20 gauge IV L hand. Initial BP was 140/90. Last BP was 120 palpated. Nitro brought pain down to 8/10 per EMS. Family history of heart disease and stroke.

## 2013-02-02 NOTE — ED Notes (Signed)
RUE:[id number]<JW> Expected date:<BR> Expected time:<BR> Means of arrival:<BR> Comments:<BR> Chest pain

## 2013-02-02 NOTE — ED Provider Notes (Signed)
History     CSN: [id number]  Arrival date & time 02/02/13  5284   First MD Initiated Contact with Patient 02/02/13 1830      Chief Complaint  Patient presents with  . Chest Pain    (Consider location/radiation/quality/duration/timing/severity/associated sxs/prior treatment) HPI Comments: 37 y.o. Female with no known cardiac hx presents today complaining of sudden onset left sided chest pain that began at 5pm today. Pain is pleuritic and reproducible. Pt states pain was initially sharp, then felt more like a pressure, radiating down her left arm. Fingers felt tingly. 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 Pt states the pain was worse with some movement. Felt some relief after 324 mg ASA and 3 nitro per EMS. Pt states pain decreased from 10/10 to 8/10.  Admits nausea, dry heaves, seeing an "aura" which she described as a "cloud," and headache s/p nitro. Pt denies a history of travel, immobilization, surgery, fevers, cancer, oral contraceptives or hormone use, swelling of the legs. The patient has no history of venous thromboembolism   Patient is a 37 y.o. female presenting with chest pain. The history is provided by the patient.  Chest Pain Pain location:  L chest Pain quality: pressure and sharp   Pain radiates to:  L arm Pain radiates to the back: no   Pain severity:  Severe Onset quality:  Sudden Timing:  Constant Progression:  Improving Chronicity:  New Context: breathing and movement   Context: no trauma   Relieved by: improved with 324 mg ASA and 3 nitro. Worsened by:  Deep breathing and movement Associated symptoms: nausea   Associated symptoms: no diaphoresis, no dizziness, no fever, no headache, no numbness, no palpitations, no shortness of breath, not vomiting and no weakness     Past Medical History  Diagnosis Date  . Diabetes mellitus     2002; no meds since 2004    Past Surgical History  Procedure Laterality Date  . Cesarean section     . Gallbladder surgery    . Tubal ligation      2000  . Cesarean section  [phone]    Family History  Problem Relation Age of Onset  . Arthritis Mother   . Diabetes Mother   . Heart disease Father   . Stroke Father   . Cancer Neg Hx   . Alcohol abuse Neg Hx   . Early death Neg Hx   . Kidney disease Neg Hx   . Hypertension Neg Hx   . Hyperlipidemia Neg Hx     History  Substance Use Topics  . Smoking status: Current Every Day Smoker -- 1.00 packs/day for 20 years    Types: Cigarettes  . Smokeless tobacco: Not on file  . Alcohol Use: No    OB History   Grav Para Term Preterm Abortions TAB SAB Ect Mult Living   4 3 3  1  1   3       Review of Systems  Constitutional: Negative for fever and diaphoresis.  HENT: Negative for neck pain and neck stiffness.   Eyes: Negative for visual disturbance.  Respiratory: Negative for apnea, chest tightness and shortness of breath.   Cardiovascular: Positive for chest pain. Negative for palpitations.  Gastrointestinal: Positive for nausea. Negative for vomiting, diarrhea and constipation.       Dry heave  Genitourinary: Negative for dysuria.  Musculoskeletal: Negative for gait problem.  Skin: Negative for rash.  Neurological: Negative for dizziness, weakness,  light-headedness, numbness and headaches.    Allergies  Erythromycin and Penicillins  Home Medications   Current Outpatient Rx  Name  Route  Sig  Dispense  Refill  . Cyanocobalamin (VITAMIN B-12 PO)   Oral   Take 1 tablet by mouth daily.         . cyclobenzaprine (FLEXERIL) 10 MG tablet   Oral   Take 10 mg by mouth 3 (three) times daily as needed for muscle spasms.         Marland Kitchen guaiFENesin (MUCINEX) 600 MG 12 hr tablet   Oral   Take 600 mg by mouth daily as needed for congestion.         Marland Kitchen Histamine Dihydrochloride (AUSTRALIAN DREAM ARTHRITIS EX)   Apply externally   Apply 2 application topically 2 (two) times daily as needed (for pain).         .  insulin detemir (LEVEMIR) 100 UNIT/ML injection   Subcutaneous   Inject 50 Units into the skin daily.         . Multiple Vitamin (MULITIVITAMIN WITH MINERALS) TABS   Oral   Take 1 tablet by mouth daily.         Marland Kitchen oxyCODONE-acetaminophen (PERCOCET/ROXICET) 5-325 MG per tablet   Oral   Take 1 tablet by mouth every 4 (four) hours as needed for pain.         . sitaGLIPtin (JANUVIA) 50 MG tablet   Oral   Take 50 mg by mouth daily.         . vitamin C (ASCORBIC ACID) 500 MG tablet   Oral   Take 1,000 mg by mouth daily.           Pulse 95  Temp(Src) 98.1 F (36.7 C) (Oral)  Resp 14  Ht 5\' 2"  (1.575 m)  Wt 243 lb (110.224 kg)  BMI 44.43 kg/m2  SpO2 98%  LMP 02/01/2013  Physical Exam  Nursing note and vitals reviewed. Constitutional: She is oriented to person, place, and time. She appears well-developed and well-nourished. No distress.  HENT:  Head: Normocephalic and atraumatic.  Eyes: Conjunctivae and EOM are normal.  Neck: Normal range of motion. Neck supple.  No meningeal signs  Cardiovascular: Normal rate, regular rhythm, normal heart sounds and intact distal pulses.  Exam reveals no gallop and no friction rub.   No murmur heard. Pulmonary/Chest: Effort normal and breath sounds normal. No respiratory distress. She has no wheezes. She has no rales. She exhibits no tenderness.  Pt not tachypnic on exam, respirations 16  Abdominal: Soft. Bowel sounds are normal. She exhibits no distension. There is no tenderness. There is no rebound and no guarding.  Musculoskeletal: Normal range of motion. She exhibits no edema and no tenderness.  FROM to upper and lower extremities  Neurological: She is alert and oriented to person, place, and time. No cranial nerve deficit.  Speech is clear and goal oriented, follows commands Sensation normal to light touch Moves extremities without ataxia, coordination intact Normal gait and balance Normal strength in upper and lower  extremities bilaterally including dorsiflexion and plantar flexion, strong and equal grip strength   Skin: Skin is warm and dry. She is not diaphoretic. No erythema.  Psychiatric: She has a normal mood and affect.    ED Course  Procedures (including critical care time)   Date: 02/02/2013  Rate: 90  Rhythm: normal sinus rhythm  QRS Axis: normal  Intervals: normal  ST/T Wave abnormalities: borderline T wave abnormalities  Conduction Disutrbances:none  Narrative Interpretation:    Old EKG Reviewed: 05/19/2012, sinus tachycardia, HR 106, same borderline T wave abnormalities   Labs Reviewed  CBC WITH DIFFERENTIAL - Abnormal; Notable for the following:    MCV 77.5 (*)    All other components within normal limits  BASIC METABOLIC PANEL - Abnormal; Notable for the following:    Sodium 134 (*)    Glucose, Bld 272 (*)    BUN 5 (*)    All other components within normal limits  D-DIMER, QUANTITATIVE  POCT I-STAT TROPONIN I   Dg Chest 2 View  02/02/2013   *RADIOLOGY REPORT*  Clinical Data: Shortness of breath.  Left-sided chest pain.  CHEST - 2 VIEW  Comparison: 05/19/2012  Findings: Heart size is normal. Low lung volumes are noted.  Both lungs are clear.  No evidence of pleural effusion.  No mass or lymphadenopathy identified.  IMPRESSION: Low lung volumes.  No active disease.   Original Report Authenticated By: Myles Rosenthal, M.D.     1. Chest pain       MDM  No hx of coronary dz in a 37 y.o. AA female pt with PMHx of DM2 and FHx of cardiac on paternal side (heart attacks, implanted defib). PE is reveals reproducible left sided chest pain that is pleuritic, neuro exam is normal, lungs CTA, equal full expansion. Suspicion for ACS is low given pt age and hx, but will r/o considering risk factors such as DM, obesity, and FHx. Low risk for PE considering perc neg (pt denies a history of travel, immobilization, surgery, fevers, cancer, oral contraceptives or hormone use, swelling of the legs.  The patient has no history of venous thromboembolis). Not tachycardic, not tachypnic, but will get d dimer given nature of pain (pleuritic and reproducible). Review of records shows that back in 05/2012, pt did have an elevated d dimer that returned a negative CT angio.   Not concerning for pneumothorax, pnuemonia, aortic dissection. EKG without acute abnormalities, negative troponin, negative d dimer, and negative CXR. Labs unconcerning. Discussed with pt that presentation of symptoms, tests and imaging performed today are reassuring to rule out acute coronary syndrome, pneumothorax, aortic dissection, pneumonia, or pulmonary embolism.   Pt has been advised to return to the ED is CP becomes exertional, associated with diaphoresis or nausea, radiates to left jaw/arm, worsens or becomes concerning in any way. Discussed with pt that since she is seen by [facility] for her primary care, that a cardiology follow up (given pt anxiety over chest pain and family hx) would be prudent. Pt appears reliable for follow up and is agreeable to discharge.   Case has been discussed with Dr. Denton Lank who agrees with the above plan to discharge.    Glade Nurse, PA-C 02/02/13 2053

## 2013-02-02 NOTE — ED Notes (Signed)
Doctor Denton Lank, MD, shown present and past EKG.

## 2013-02-08 NOTE — ED Provider Notes (Signed)
Medical screening examination/treatment/procedure(s) were performed by non-physician practitioner and as supervising physician I was immediately available for consultation/collaboration.   Suzi Roots, MD 02/08/13 [phone]

## 2013-02-16 ENCOUNTER — Emergency Department (INDEPENDENT_AMBULATORY_CARE_PROVIDER_SITE_OTHER)
Admission: EM | Admit: 2013-02-16 | Discharge: 2013-02-16 | Disposition: A | Discharge status: Home / Self Care | Payer: Worker's Compensation | Source: Home / Self Care | Attending: Emergency Medicine | Admitting: Emergency Medicine

## 2013-02-16 ENCOUNTER — Encounter (HOSPITAL_COMMUNITY): Payer: Self-pay | Admitting: Emergency Medicine

## 2013-02-16 DIAGNOSIS — S335XXA Sprain of ligaments of lumbar spine, initial encounter: Secondary | ICD-10-CM

## 2013-02-16 DIAGNOSIS — S39012A Strain of muscle, fascia and tendon of lower back, initial encounter: Secondary | ICD-10-CM

## 2013-02-16 MED ORDER — CYCLOBENZAPRINE HCL 5 MG PO TABS: 5.0000 mg | ORAL_TABLET | Freq: Three times a day (TID) | ORAL | Status: DC | PRN

## 2013-02-16 MED ORDER — DICLOFENAC SODIUM 75 MG PO TBEC: 75.0000 mg | DELAYED_RELEASE_TABLET | Freq: Two times a day (BID) | ORAL | Status: DC

## 2013-02-16 MED ORDER — OXYCODONE-ACETAMINOPHEN 5-325 MG PO TABS: ORAL_TABLET | ORAL | Status: DC

## 2013-02-16 MED ORDER — HYDROCODONE-ACETAMINOPHEN 5-325 MG PO TABS
ORAL_TABLET | ORAL | Status: AC
  Filled 2013-02-16: qty 2

## 2013-02-16 MED ORDER — HYDROCODONE-ACETAMINOPHEN 5-325 MG PO TABS
2.0000 | ORAL_TABLET | Freq: Once | ORAL | Status: AC
Start: 2013-02-16 — End: 2013-02-16
  Administered 2013-02-16: 2 via ORAL

## 2013-02-16 NOTE — ED Notes (Signed)
Pt c/o injury to back x today while moving a pt.  Pt states that she heard a popping noise then extreme pain in center of back.  Pt has not taken any otc meds for treatment.

## 2013-02-16 NOTE — ED Provider Notes (Signed)
Chief Complaint:   Chief Complaint  Patient presents with  . Back Pain    pt injured back while moving a pt. "heard popping sound and then pain" incident happened around 4:30/5 p.m today.     History of Present Illness:   Maureen Price is a 37 year old female who comes in today for a back injury sustained at work. She works as a Associate Professor at [employer]. The incident happened around 4:20 PM today. She was pulling a heavy woman in a wheelchair, twisted, and felt something pop in her mid back area. Ever since then she's had severe pain in that area which is worse with any kind of movement which radiates into her chest and into her left leg. Nothing makes it better. The pain is rated 20/10 in intensity. She denies any numbness or tingling in the extremities, there is no muscle weakness, bladder or bowel complaints. She denies any abdominal pain. She does not have any shortness of breath.  Review of Systems:  Other than noted above, the patient denies any of the following symptoms: Systemic:  No fever, chills, severe fatigue, or unexplained weight loss. GI:  No abdominal pain, nausea, vomiting, diarrhea, constipation, incontinence of bowel, or blood in stool. GU:  No dysuria, frequency, urgency, or hematuria. No incontinence of urine or difficulty urinating.  M-S:  No neck pain, joint pain, arthritis, or myalgias. Neuro:  No paresthesias, saddle anesthesia, muscular weakness, or progressive neurological deficit.  PMFSH:  Past medical history, family history, social history, meds, and allergies were reviewed. Specifically, there is no history of cancer, major trauma, osteoporosis, immunosuppression, or HIV infection. She has diabetes and injured her lower back in the past.  Physical Exam:   Vital signs:  BP 126/74  Pulse 86  Temp(Src) 98.4 F (36.9 C) (Oral)  Resp 18  SpO2 100%  LMP 02/01/2013 General:  Alert, oriented, in no distress. Lungs: Clear to auscultation. Heart: Regular  rhythm, no gallop or murmur. Abdomen:  Soft, non-tender.  No organomegaly or mass.  No pulsatile midline abdominal mass or bruit. Back:  There is tenderness to palpation in the mid back area both in the midline in the paravertebral areas. This is located at about the T12 or L1 area. Her back has a very minimal range of motion with just a few degrees of movement in all directions with pain and muscle spasm. Straight leg raising was negative. Neuro:  Normal muscle strength, sensations and DTRs. Extremities: Pedal pulses were full, there was no edema. Skin:  Clear, warm and dry.  No rash.  Course in Urgent Care Center:   For pain she was given Norco 5/325 2 tablets by mouth.  Assessment:  The encounter diagnosis was Lumbar strain, initial encounter.  No evidence of neurological impairment. Does not need an x-ray. Will need followup at occupational health.  Plan:   1.  The following meds were prescribed:   Discharge Medication List as of 02/16/2013  7:47 PM    START taking these medications   Details  !! cyclobenzaprine (FLEXERIL) 5 MG tablet Take 1 tablet (5 mg total) by mouth 3 (three) times daily as needed for muscle spasms., Starting 02/16/2013, Until Discontinued, Normal    diclofenac (VOLTAREN) 75 MG EC tablet Take 1 tablet (75 mg total) by mouth 2 (two) times daily., Starting 02/16/2013, Until Discontinued, Normal    !! oxyCODONE-acetaminophen (PERCOCET) 5-325 MG per tablet 1 to 2 tablets every 6 hours as needed for pain., Print     !! -  Potential duplicate medications found. Please discuss with provider.     2.  The patient was instructed in symptomatic care and handouts were given. 3.  The patient was told to return if becoming worse in any way, if no better in 2 weeks, and given some red flag symptoms including new neurological symptoms that would indicate earlier return. 4.  The patient was encouraged to try to be as active as possible and given some exercises to do followed by  moist heat. 5.  Follow up at occupational health. No work until then.    Reuben Likes, MD 02/16/13 2113

## 2013-02-18 ENCOUNTER — Emergency Department (HOSPITAL_COMMUNITY)
Admission: EM | Admit: 2013-02-18 | Discharge: 2013-02-18 | Disposition: A | Discharge status: Home / Self Care | Payer: BC Managed Care – PPO | Attending: Emergency Medicine | Admitting: Emergency Medicine

## 2013-02-18 ENCOUNTER — Encounter (HOSPITAL_COMMUNITY): Payer: Self-pay | Admitting: Emergency Medicine

## 2013-02-18 DIAGNOSIS — Z3202 Encounter for pregnancy test, result negative: Secondary | ICD-10-CM | POA: Insufficient documentation

## 2013-02-18 DIAGNOSIS — N938 Other specified abnormal uterine and vaginal bleeding: Secondary | ICD-10-CM | POA: Insufficient documentation

## 2013-02-18 DIAGNOSIS — F17200 Nicotine dependence, unspecified, uncomplicated: Secondary | ICD-10-CM | POA: Insufficient documentation

## 2013-02-18 DIAGNOSIS — N949 Unspecified condition associated with female genital organs and menstrual cycle: Secondary | ICD-10-CM | POA: Insufficient documentation

## 2013-02-18 DIAGNOSIS — Z79899 Other long term (current) drug therapy: Secondary | ICD-10-CM | POA: Insufficient documentation

## 2013-02-18 DIAGNOSIS — Z794 Long term (current) use of insulin: Secondary | ICD-10-CM | POA: Insufficient documentation

## 2013-02-18 DIAGNOSIS — Z880 Allergy status to penicillin: Secondary | ICD-10-CM | POA: Insufficient documentation

## 2013-02-18 DIAGNOSIS — E119 Type 2 diabetes mellitus without complications: Secondary | ICD-10-CM | POA: Insufficient documentation

## 2013-02-18 LAB — URINALYSIS, ROUTINE W REFLEX MICROSCOPIC
Bilirubin Urine: NEGATIVE
Nitrite: NEGATIVE
Specific Gravity, Urine: 1.015 (ref 1.005–1.030)
Urobilinogen, UA: 0.2 mg/dL (ref 0.0–1.0)

## 2013-02-18 LAB — WET PREP, GENITAL: Yeast Wet Prep HPF POC: NONE SEEN

## 2013-02-18 LAB — URINE MICROSCOPIC-ADD ON

## 2013-02-18 NOTE — ED Provider Notes (Addendum)
History     CSN: [id number]  Arrival date & time 02/18/13  4098   First MD Initiated Contact with Patient 02/18/13 [phone]      Chief Complaint  Patient presents with  . Vaginal Bleeding    (Consider location/radiation/quality/duration/timing/severity/associated sxs/prior treatment) Patient is a 37 y.o. female presenting with vaginal bleeding. The history is provided by the patient.  Vaginal Bleeding Quality:  Dark red and spotting (intermittent bright red blood and brown dark blood) Severity:  Moderate Onset quality:  Sudden Duration:  2 days Timing:  Intermittent Progression:  Waxing and waning Chronicity:  New Menstrual history:  Regular Possible pregnancy: no (prior tubal ligation)   Context: spontaneously   Relieved by:  Nothing Worsened by:  Nothing tried Ineffective treatments:  None tried Associated symptoms: no dysuria, no nausea and no vaginal discharge   Associated symptoms comment:  Some pelvic cramps and breast tenderness Risk factors: no bleeding disorder, no hx of ectopic pregnancy, no hx of endometriosis and no ovarian cysts   Risk factors comment:  Hx of fibroids in the past   Past Medical History  Diagnosis Date  . Diabetes mellitus     2002; no meds since 2004    Past Surgical History  Procedure Laterality Date  . Cesarean section    . Gallbladder surgery    . Tubal ligation      2000  . Cesarean section  [phone]    Family History  Problem Relation Age of Onset  . Arthritis Mother   . Diabetes Mother   . Heart disease Father   . Stroke Father   . Cancer Neg Hx   . Alcohol abuse Neg Hx   . Early death Neg Hx   . Kidney disease Neg Hx   . Hypertension Neg Hx   . Hyperlipidemia Neg Hx     History  Substance Use Topics  . Smoking status: Current Every Day Smoker -- 1.00 packs/day for 20 years    Types: Cigarettes  . Smokeless tobacco: Not on file  . Alcohol Use: No    OB History   Grav Para Term Preterm Abortions TAB SAB Ect  Mult Living   4 3 3  1  1   3       Review of Systems  Respiratory: Negative for shortness of breath.   Cardiovascular: Negative for chest pain.  Gastrointestinal: Negative for nausea.  Genitourinary: Positive for vaginal bleeding and pelvic pain. Negative for dysuria, vaginal discharge and menstrual problem.  All other systems reviewed and are negative.    Allergies  Erythromycin and Penicillins  Home Medications   Current Outpatient Rx  Name  Route  Sig  Dispense  Refill  . cyclobenzaprine (FLEXERIL) 10 MG tablet   Oral   Take 10 mg by mouth 3 (three) times daily as needed for muscle spasms.         Marland Kitchen Histamine Dihydrochloride (AUSTRALIAN DREAM ARTHRITIS EX)   Apply externally   Apply 2 application topically 2 (two) times daily as needed (for pain).         . insulin detemir (LEVEMIR) 100 UNIT/ML injection   Subcutaneous   Inject 50 Units into the skin daily.         . Multiple Vitamin (MULITIVITAMIN WITH MINERALS) TABS   Oral   Take 1 tablet by mouth daily.         Marland Kitchen oxyCODONE-acetaminophen (PERCOCET) 5-325 MG per tablet      1 to 2 tablets every 6  hours as needed for pain.   20 tablet   0   . sitaGLIPtin (JANUVIA) 50 MG tablet   Oral   Take 50 mg by mouth daily.         . vitamin B-12 (CYANOCOBALAMIN) 1000 MCG tablet   Oral   Take 1,000 mcg by mouth daily.         . vitamin C (ASCORBIC ACID) 500 MG tablet   Oral   Take 1,000 mg by mouth daily.           BP 120/68  Temp(Src) 98.3 F (36.8 C) (Oral)  Resp 20  SpO2 97%  LMP 02/01/2013  Physical Exam  Nursing note and vitals reviewed. Constitutional: She is oriented to person, place, and time. She appears well-developed and well-nourished. No distress.  HENT:  Head: Normocephalic and atraumatic.  Mouth/Throat: Oropharynx is clear and moist.  Eyes: Conjunctivae and EOM are normal. Pupils are equal, round, and reactive to light.  Neck: Normal range of motion. Neck supple.   Cardiovascular: Normal rate, regular rhythm and intact distal pulses.   No murmur heard. Pulmonary/Chest: Effort normal and breath sounds normal. No respiratory distress. She has no wheezes. She has no rales.  Abdominal: Soft. She exhibits no distension. There is tenderness in the suprapubic area. There is no rebound and no guarding.  Genitourinary: There is no rash, tenderness or lesion on the right labia. There is no rash, tenderness or lesion on the left labia. Uterus is tender. Cervix exhibits no motion tenderness, no discharge and no friability. Right adnexum displays no tenderness. Left adnexum displays no tenderness. There is bleeding around the vagina. No vaginal discharge found.  Old blood in the vaginal vault  Musculoskeletal: Normal range of motion. She exhibits no edema and no tenderness.  Neurological: She is alert and oriented to person, place, and time.  Skin: Skin is warm and dry. No rash noted. No erythema.  Psychiatric: She has a normal mood and affect. Her behavior is normal.    ED Course  Procedures (including critical care time)  Labs Reviewed  WET PREP, GENITAL - Abnormal; Notable for the following:    Clue Cells Wet Prep HPF POC FEW (*)    All other components within normal limits  URINALYSIS, ROUTINE W REFLEX MICROSCOPIC - Abnormal; Notable for the following:    APPearance CLOUDY (*)    Glucose, UA 250 (*)    Hgb urine dipstick LARGE (*)    Leukocytes, UA TRACE (*)    All other components within normal limits  URINE MICROSCOPIC-ADD ON - Abnormal; Notable for the following:    Squamous Epithelial / LPF FEW (*)    Bacteria, UA FEW (*)    All other components within normal limits  GC/CHLAMYDIA PROBE AMP  POCT PREGNANCY, URINE   No results found.   1. Dysfunctional uterine bleeding       MDM   Patient presenting due to 2 recurrent vaginal bleeding. She states she had a normal menses 2 weeks ago and then yesterday started bleeding again. Having  intermittent lower abdominal cramps and blood is alternating between fresh and old.  She states she's had a tubal ligation him he usually has regular periods.  She also has a history of uterine fibroids but states she's never required intervention. She takes no hormonal supplements.  She denies heavy bleeding but was concerned that she may be pregnant or have done something when she injured her back on Friday.  Low concern between in  the relation to her accidents where she strained her back when moving the patient.  Low suspicion for pregnancy however UPT pending.  Most likely dysfunctional uterine bleeding. Will do pelvic for further inspection.  9:21 AM Pregnancy neg and wet prep without acute findings.  Pt d/ced home to f/u with women's clinic      Gwyneth Sprout, MD 02/18/13 9604  Gwyneth Sprout, MD 02/18/13 [phone]

## 2013-02-18 NOTE — ED Notes (Signed)
Pt states that she had her period 2 weeks ago, period ended and then she had a "lumbar strain" while working at her job on Thursday and then began to have another period on Friday.  Pt is concerned because the blood is brown.

## 2013-02-18 NOTE — ED Notes (Addendum)
Patient able to ambulate independently. Requires assist when she has to bend. States she had a lumber sprain per Urgent care. Went to urgent care Thursday, the same day she heard and felt a pop in her mid back. Patient just went to bathroom and had light red blood when she wiped. Patient states she has a regular period.Bleeding amount is about 3 tablespoons LMS was from may 28 -June 5.

## 2013-02-19 LAB — GC/CHLAMYDIA PROBE AMP: CT Probe RNA: NEGATIVE

## 2013-05-30 ENCOUNTER — Emergency Department (HOSPITAL_COMMUNITY)
Admission: EM | Admit: 2013-05-30 | Discharge: 2013-05-30 | Disposition: A | Discharge status: Home / Self Care | Payer: BC Managed Care – PPO | Attending: Emergency Medicine | Admitting: Emergency Medicine

## 2013-05-30 ENCOUNTER — Encounter (HOSPITAL_COMMUNITY): Payer: Self-pay | Admitting: Emergency Medicine

## 2013-05-30 DIAGNOSIS — G43909 Migraine, unspecified, not intractable, without status migrainosus: Secondary | ICD-10-CM | POA: Insufficient documentation

## 2013-05-30 DIAGNOSIS — R42 Dizziness and giddiness: Secondary | ICD-10-CM | POA: Insufficient documentation

## 2013-05-30 DIAGNOSIS — Z3202 Encounter for pregnancy test, result negative: Secondary | ICD-10-CM | POA: Insufficient documentation

## 2013-05-30 DIAGNOSIS — M549 Dorsalgia, unspecified: Secondary | ICD-10-CM | POA: Insufficient documentation

## 2013-05-30 DIAGNOSIS — Z7982 Long term (current) use of aspirin: Secondary | ICD-10-CM | POA: Insufficient documentation

## 2013-05-30 DIAGNOSIS — Z79899 Other long term (current) drug therapy: Secondary | ICD-10-CM | POA: Insufficient documentation

## 2013-05-30 DIAGNOSIS — M542 Cervicalgia: Secondary | ICD-10-CM | POA: Insufficient documentation

## 2013-05-30 DIAGNOSIS — E119 Type 2 diabetes mellitus without complications: Secondary | ICD-10-CM | POA: Insufficient documentation

## 2013-05-30 DIAGNOSIS — Z880 Allergy status to penicillin: Secondary | ICD-10-CM | POA: Insufficient documentation

## 2013-05-30 DIAGNOSIS — F17200 Nicotine dependence, unspecified, uncomplicated: Secondary | ICD-10-CM | POA: Insufficient documentation

## 2013-05-30 DIAGNOSIS — H53149 Visual discomfort, unspecified: Secondary | ICD-10-CM | POA: Insufficient documentation

## 2013-05-30 LAB — COMPREHENSIVE METABOLIC PANEL
Albumin: 3.4 g/dL — ABNORMAL LOW (ref 3.5–5.2)
BUN: 5 mg/dL — ABNORMAL LOW (ref 6–23)
Calcium: 8.9 mg/dL (ref 8.4–10.5)
Creatinine, Ser: 0.42 mg/dL — ABNORMAL LOW (ref 0.50–1.10)
Potassium: 3.5 mEq/L (ref 3.5–5.1)
Total Protein: 7.1 g/dL (ref 6.0–8.3)

## 2013-05-30 LAB — URINALYSIS, ROUTINE W REFLEX MICROSCOPIC
Glucose, UA: >1000 mg/dL — AB
Leukocytes, UA: NEGATIVE
Nitrite: NEGATIVE
Specific Gravity, Urine: 1.026 (ref 1.005–1.030)
pH: 5.5 (ref 5.0–8.0)

## 2013-05-30 LAB — CBC
HCT: 39.5 % (ref 36.0–46.0)
MCH: 27.3 pg (ref 26.0–34.0)
MCHC: 35.2 g/dL (ref 30.0–36.0)
MCV: 77.6 fL — ABNORMAL LOW (ref 78.0–100.0)
RDW: 14 % (ref 11.5–15.5)

## 2013-05-30 LAB — URINE MICROSCOPIC-ADD ON

## 2013-05-30 LAB — GLUCOSE, CAPILLARY
Glucose-Capillary: 344 mg/dL — ABNORMAL HIGH (ref 70–99)
Glucose-Capillary: 256 mg/dL — ABNORMAL HIGH (ref 70–99)

## 2013-05-30 MED ORDER — METOCLOPRAMIDE HCL 5 MG/ML IJ SOLN
10.0000 mg | Freq: Once | INTRAMUSCULAR | Status: AC
  Administered 2013-05-30: 10 mg via INTRAVENOUS
  Filled 2013-05-30: qty 2

## 2013-05-30 MED ORDER — SUMATRIPTAN SUCCINATE 6 MG/0.5ML ~~LOC~~ SOLN
6.0000 mg | Freq: Once | SUBCUTANEOUS | Status: AC
  Administered 2013-05-30: 6 mg via SUBCUTANEOUS
  Filled 2013-05-30: qty 0.5

## 2013-05-30 MED ORDER — KETOROLAC TROMETHAMINE 30 MG/ML IJ SOLN
30.0000 mg | Freq: Once | INTRAMUSCULAR | Status: AC
  Administered 2013-05-30: 30 mg via INTRAVENOUS
  Filled 2013-05-30: qty 1

## 2013-05-30 MED ORDER — SODIUM CHLORIDE 0.9 % IV BOLUS (SEPSIS)
1000.0000 mL | Freq: Once | INTRAVENOUS | Status: AC
  Administered 2013-05-30: 1000 mL via INTRAVENOUS

## 2013-05-30 MED ORDER — SODIUM CHLORIDE 0.9 % IV BOLUS (SEPSIS)
1000.0000 mL | Freq: Once | INTRAVENOUS | Status: AC
  Administered 2013-05-30 (×2): 1000 mL via INTRAVENOUS

## 2013-05-30 MED ORDER — DIPHENHYDRAMINE HCL 50 MG/ML IJ SOLN
25.0000 mg | Freq: Once | INTRAMUSCULAR | Status: AC
  Administered 2013-05-30: 25 mg via INTRAVENOUS
  Filled 2013-05-30 (×2): qty 1

## 2013-05-30 NOTE — ED Notes (Signed)
Pt states that this morning she started having a migraine with pain that goes down her neck to her back.  Pt also states she is diabetic and having shakes and dizziness.

## 2013-05-30 NOTE — ED Notes (Signed)
CBG 283 

## 2013-05-30 NOTE — ED Provider Notes (Signed)
CSN: [id number]     Arrival date & time 05/30/13  1254 History   First MD Initiated Contact with Patient 05/30/13 1421     Chief Complaint  Patient presents with  . Migraine  . Back Pain  . Neck Pain   (Consider location/radiation/quality/duration/timing/severity/associated sxs/prior Treatment) The history is provided by the patient and medical records. No language interpreter was used.    Maureen Price is a 37 y.o. female  with a hx of DM presents to the Emergency Department complaining of gradual, persistent, progressively worsening generalized headache onset this AM. Associated symptoms include nausea, muscle spasms, dizziness, lightheadedness.  Pt reports hx of complicated migraines with speech disturbance which occurred for several minutes today; but has since resolved.  She reports that her migraine headache is the same type of pain but worse than usual.  Pt also reports the radiation into her neck is unusual but she has not had any neck stiffness.   Pt took Excedrin migraine this morning without relief. Nothing makes it better and light makes it worse.  Pt denies fever, chills, chest pain, SOB, abd pain, vomiting, diarrhea, weakness, numbness, tingling, dysuria, hematuria.  Pt denies thunderclap headache or sudden onset.     Past Medical History  Diagnosis Date  . Diabetes mellitus     2002; no meds since 2004   Past Surgical History  Procedure Laterality Date  . Cesarean section    . Gallbladder surgery    . Tubal ligation      2000  . Cesarean section  [phone]   Family History  Problem Relation Age of Onset  . Arthritis Mother   . Diabetes Mother   . Heart disease Father   . Stroke Father   . Cancer Neg Hx   . Alcohol abuse Neg Hx   . Early death Neg Hx   . Kidney disease Neg Hx   . Hypertension Neg Hx   . Hyperlipidemia Neg Hx    History  Substance Use Topics  . Smoking status: Current Every Day Smoker -- 1.00 packs/day for 20 years    Types:  Cigarettes  . Smokeless tobacco: Not on file  . Alcohol Use: No   OB History   Grav Para Term Preterm Abortions TAB SAB Ect Mult Living   4 3 3  1  1   3      Review of Systems  Constitutional: Negative for fever, diaphoresis, appetite change, fatigue and unexpected weight change.  HENT: Positive for neck pain. Negative for mouth sores and neck stiffness.   Eyes: Positive for photophobia. Negative for visual disturbance.  Respiratory: Negative for cough, chest tightness, shortness of breath and wheezing.   Cardiovascular: Negative for chest pain.  Gastrointestinal: Negative for nausea, vomiting, abdominal pain, diarrhea and constipation.  Endocrine: Negative for polydipsia, polyphagia and polyuria.  Genitourinary: Negative for dysuria, urgency, frequency and hematuria.  Musculoskeletal: Positive for back pain.  Skin: Negative for rash.  Allergic/Immunologic: Negative for immunocompromised state.  Neurological: Positive for headaches. Negative for syncope and light-headedness.  Hematological: Does not bruise/bleed easily.  Psychiatric/Behavioral: Negative for sleep disturbance. The patient is not nervous/anxious.     Allergies  Erythromycin and Penicillins  Home Medications   Current Outpatient Rx  Name  Route  Sig  Dispense  Refill  . aspirin-acetaminophen-caffeine (EXCEDRIN MIGRAINE) 250-250-65 MG per tablet   Oral   Take 2 tablets by mouth every 6 (six) hours as needed for pain.         Marland Kitchen  insulin detemir (LEVEMIR) 100 UNIT/ML injection   Subcutaneous   Inject 50 Units into the skin daily.         . sitaGLIPtin (JANUVIA) 50 MG tablet   Oral   Take 50 mg by mouth daily.          BP 119/66  Pulse 78  Temp(Src) 98 F (36.7 C) (Oral)  Resp 18  SpO2 97%  LMP 05/08/2013 Physical Exam  Nursing note and vitals reviewed. Constitutional: She is oriented to person, place, and time. She appears well-developed and well-nourished. No distress.  HENT:  Head:  Normocephalic and atraumatic.  Right Ear: Hearing, tympanic membrane, external ear and ear canal normal.  Left Ear: Hearing, tympanic membrane, external ear and ear canal normal.  Nose: Nose normal. No mucosal edema, rhinorrhea or nose lacerations.  Mouth/Throat: Uvula is midline, oropharynx is clear and moist and mucous membranes are normal. Mucous membranes are not dry. No edematous. No oropharyngeal exudate, posterior oropharyngeal edema, posterior oropharyngeal erythema or tonsillar abscesses.  Eyes: Conjunctivae and EOM are normal. Pupils are equal, round, and reactive to light. No scleral icterus.  Neck: Normal range of motion. Neck supple. Muscular tenderness present. No spinous process tenderness present. No rigidity. Normal range of motion present.  Mild tenderness to palpation of the paraspinal muscles  Cardiovascular: Normal rate, regular rhythm, normal heart sounds and intact distal pulses.   No murmur heard. Pulmonary/Chest: Effort normal and breath sounds normal. No respiratory distress. She has no wheezes. She has no rales.  Abdominal: Soft. Bowel sounds are normal. She exhibits no distension. There is no tenderness. There is no rebound and no guarding.  Musculoskeletal: Normal range of motion. She exhibits tenderness.  Tenderness to palpation of the bilateral proximal trapezius  Lymphadenopathy:    She has no cervical adenopathy.  Neurological: She is alert and oriented to person, place, and time. She has normal reflexes. No cranial nerve deficit. She exhibits normal muscle tone. Coordination normal.  Speech is clear and goal oriented, follows commands Cranial nerves III - XII without deficit, no facial droop Normal strength in upper and lower extremities bilaterally, strong and equal grip strength Sensation normal to light and sharp touch Moves extremities without ataxia, coordination intact Normal finger to nose and rapid alternating movements Neg romberg, no pronator  drift Normal gait Normal heel-shin and balance   Skin: Skin is warm and dry. No rash noted. She is not diaphoretic. No erythema.  Psychiatric: She has a normal mood and affect. Her behavior is normal. Judgment and thought content normal.    ED Course  Procedures (including critical care time) Labs Review Labs Reviewed  GLUCOSE, CAPILLARY - Abnormal; Notable for the following:    Glucose-Capillary 344 (*)    All other components within normal limits  CBC - Abnormal; Notable for the following:    MCV 77.6 (*)    All other components within normal limits  COMPREHENSIVE METABOLIC PANEL - Abnormal; Notable for the following:    Glucose, Bld 347 (*)    BUN 5 (*)    Creatinine, Ser 0.42 (*)    Albumin 3.4 (*)    All other components within normal limits  URINALYSIS, ROUTINE W REFLEX MICROSCOPIC - Abnormal; Notable for the following:    APPearance CLOUDY (*)    Glucose, UA >1000 (*)    All other components within normal limits  GLUCOSE, CAPILLARY - Abnormal; Notable for the following:    Glucose-Capillary 283 (*)    All other components within  normal limits  URINE MICROSCOPIC-ADD ON - Abnormal; Notable for the following:    Squamous Epithelial / LPF FEW (*)    All other components within normal limits  GLUCOSE, CAPILLARY - Abnormal; Notable for the following:    Glucose-Capillary 256 (*)    All other components within normal limits  POCT PREGNANCY, URINE   Imaging Review No results found.  MDM   1. Migraine headache    SOKHNA CHRISTOPH presents with migraine headache.  Pt HA treated and improved while in ED including resolution of her neck and back pain. Presentation is like pts typical HA only more intense and non concerning for [facility], ICH, Meningitis, or temporal arteritis. Pt is afebrile with no focal neuro deficits, nuchal rigidity, or change in vision. Pt is to follow up with PCP to discuss prophylactic medication. Pt verbalizes understanding and is agreeable with plan to  dc.   It has been determined that no acute conditions requiring further emergency intervention are present at this time. The patient/guardian have been advised of the diagnosis and plan. We have discussed signs and symptoms that warrant return to the ED, such as changes or worsening in symptoms.   Vital signs are stable at discharge.   BP 119/66  Pulse 78  Temp(Src) 98 F (36.7 C) (Oral)  Resp 18  SpO2 97%  LMP 05/08/2013  Patient/guardian has voiced understanding and agreed to follow-up with the PCP or specialist.       Dierdre Forth, PA-C 05/31/13 0034

## 2013-05-31 NOTE — ED Provider Notes (Signed)
Medical screening examination/treatment/procedure(s) were performed by non-physician practitioner and as supervising physician I was immediately available for consultation/collaboration.  Shon Baton, MD 05/31/13 [phone]

## 2013-06-26 ENCOUNTER — Emergency Department (HOSPITAL_COMMUNITY): Payer: BC Managed Care – PPO

## 2013-06-26 ENCOUNTER — Encounter (HOSPITAL_COMMUNITY): Payer: Self-pay | Admitting: Emergency Medicine

## 2013-06-26 ENCOUNTER — Observation Stay (HOSPITAL_COMMUNITY)
Admission: EM | Admit: 2013-06-26 | Discharge: 2013-06-27 | Disposition: A | Discharge status: Home / Self Care | Payer: BC Managed Care – PPO | Attending: Family Medicine | Admitting: Family Medicine

## 2013-06-26 DIAGNOSIS — IMO0002 Reserved for concepts with insufficient information to code with codable children: Secondary | ICD-10-CM | POA: Diagnosis present

## 2013-06-26 DIAGNOSIS — Z79899 Other long term (current) drug therapy: Secondary | ICD-10-CM | POA: Insufficient documentation

## 2013-06-26 DIAGNOSIS — F17200 Nicotine dependence, unspecified, uncomplicated: Secondary | ICD-10-CM | POA: Insufficient documentation

## 2013-06-26 DIAGNOSIS — Z794 Long term (current) use of insulin: Secondary | ICD-10-CM | POA: Insufficient documentation

## 2013-06-26 DIAGNOSIS — E1151 Type 2 diabetes mellitus with diabetic peripheral angiopathy without gangrene: Secondary | ICD-10-CM | POA: Diagnosis present

## 2013-06-26 DIAGNOSIS — R0602 Shortness of breath: Secondary | ICD-10-CM | POA: Insufficient documentation

## 2013-06-26 DIAGNOSIS — I498 Other specified cardiac arrhythmias: Secondary | ICD-10-CM | POA: Insufficient documentation

## 2013-06-26 DIAGNOSIS — R079 Chest pain, unspecified: Principal | ICD-10-CM | POA: Insufficient documentation

## 2013-06-26 DIAGNOSIS — IMO0001 Reserved for inherently not codable concepts without codable children: Secondary | ICD-10-CM | POA: Insufficient documentation

## 2013-06-26 LAB — POCT I-STAT TROPONIN I: Troponin i, poc: 0 ng/mL (ref 0.00–0.08)

## 2013-06-26 LAB — COMPREHENSIVE METABOLIC PANEL
ALT: 13 U/L (ref 0–35)
Alkaline Phosphatase: 111 U/L (ref 39–117)
BUN: 7 mg/dL (ref 6–23)
CO2: 25 mEq/L (ref 19–32)
Calcium: 8.9 mg/dL (ref 8.4–10.5)
Chloride: 100 mEq/L (ref 96–112)
Creatinine, Ser: 0.44 mg/dL — ABNORMAL LOW (ref 0.50–1.10)
GFR calc Af Amer: >90 mL/min (ref >90)
GFR calc non Af Amer: >90 mL/min (ref >90)
Glucose, Bld: 334 mg/dL — ABNORMAL HIGH (ref 70–99)
Potassium: 3.4 mEq/L — ABNORMAL LOW (ref 3.5–5.1)
Total Bilirubin: 0.2 mg/dL — ABNORMAL LOW (ref 0.3–1.2)

## 2013-06-26 LAB — CBC WITH DIFFERENTIAL/PLATELET
Basophils Relative: 0 % (ref 0–1)
Eosinophils Relative: 1 % (ref 0–5)
HCT: 37.5 % (ref 36.0–46.0)
Hemoglobin: 13.6 g/dL (ref 12.0–15.0)
Lymphocytes Relative: 40 % (ref 12–46)
Lymphs Abs: 3.4 10*3/uL (ref 0.7–4.0)
MCHC: 36.3 g/dL — ABNORMAL HIGH (ref 30.0–36.0)
MCV: 76.7 fL — ABNORMAL LOW (ref 78.0–100.0)
Monocytes Absolute: 0.5 10*3/uL (ref 0.1–1.0)
Monocytes Relative: 6 % (ref 3–12)
Neutrophils Relative %: 53 % (ref 43–77)
RBC: 4.89 MIL/uL (ref 3.87–5.11)
RDW: 13.6 % (ref 11.5–15.5)
WBC: 8.4 10*3/uL (ref 4.0–10.5)

## 2013-06-26 MED ORDER — NITROGLYCERIN 2 % TD OINT
1.0000 [in_us] | TOPICAL_OINTMENT | Freq: Once | TRANSDERMAL | Status: AC
  Administered 2013-06-26: 1 [in_us] via TOPICAL
  Filled 2013-06-26: qty 30

## 2013-06-26 MED ORDER — MORPHINE SULFATE 4 MG/ML IJ SOLN
4.0000 mg | Freq: Once | INTRAMUSCULAR | Status: AC
  Administered 2013-06-27: 4 mg via INTRAVENOUS
  Filled 2013-06-26: qty 1

## 2013-06-26 MED ORDER — ASPIRIN 81 MG PO CHEW
324.0000 mg | CHEWABLE_TABLET | Freq: Once | ORAL | Status: AC
  Administered 2013-06-26: 324 mg via ORAL
  Filled 2013-06-26: qty 4

## 2013-06-26 MED ORDER — GI COCKTAIL ~~LOC~~
30.0000 mL | Freq: Once | ORAL | Status: AC
  Administered 2013-06-26: 30 mL via ORAL
  Filled 2013-06-26: qty 30

## 2013-06-26 NOTE — ED Notes (Signed)
PT state woke to chest pain this am. Increase in severity over the day. Began to vomit after eating. Non reproducible.

## 2013-06-26 NOTE — ED Provider Notes (Signed)
CSN: [id number]     Arrival date & time 06/26/13  2028 History   First MD Initiated Contact with Patient 06/26/13 2043     Chief Complaint  Patient presents with  . Chest Pain   (Consider location/radiation/quality/duration/timing/severity/associated sxs/prior Treatment) HPI  This is a 37 year old female with history of diabetes who presents with chest pain. Patient reports onset of left-sided chest pain this morning when she woke up at 9 AM. She describes it as constant and "like bricks are on my chest." She endorses shortness of breath without diaphoresis. She states her chest pain is not associated with exertion and it has been constant.  She had one episode of emesis earlier today. She denies any lower ext swelling. She denies any history of blood clot. She does have an early family of heart disease and is a current smoker.  Patient currently rates her pain a 9/10.   Past Medical History  Diagnosis Date  . Diabetes mellitus     2002; no meds since 2004   Past Surgical History  Procedure Laterality Date  . Cesarean section    . Gallbladder surgery    . Tubal ligation      2000  . Cesarean section  [phone]   Family History  Problem Relation Age of Onset  . Arthritis Mother   . Diabetes Mother   . Heart disease Father   . Stroke Father   . Cancer Neg Hx   . Alcohol abuse Neg Hx   . Early death Neg Hx   . Kidney disease Neg Hx   . Hypertension Neg Hx   . Hyperlipidemia Neg Hx    History  Substance Use Topics  . Smoking status: Current Every Day Smoker -- 1.00 packs/day for 20 years    Types: Cigarettes  . Smokeless tobacco: Not on file  . Alcohol Use: No   OB History   Grav Para Term Preterm Abortions TAB SAB Ect Mult Living   4 3 3  1  1   3      Review of Systems  Constitutional: Negative for fever.  Respiratory: Positive for chest tightness and shortness of breath. Negative for cough.   Cardiovascular: Positive for chest pain. Negative for leg swelling.   Gastrointestinal: Positive for vomiting. Negative for nausea and abdominal pain.  Genitourinary: Negative for dysuria.  Musculoskeletal: Negative for back pain.  Skin: Negative for wound.  Neurological: Negative for headaches.  All other systems reviewed and are negative.    Allergies  Erythromycin and Penicillins  Home Medications   Current Outpatient Rx  Name  Route  Sig  Dispense  Refill  . aspirin-acetaminophen-caffeine (EXCEDRIN MIGRAINE) 250-250-65 MG per tablet   Oral   Take 2 tablets by mouth every 6 (six) hours as needed for pain.         Marland Kitchen insulin detemir (LEVEMIR) 100 UNIT/ML injection   Subcutaneous   Inject 50 Units into the skin daily.         . sitaGLIPtin (JANUVIA) 50 MG tablet   Oral   Take 50 mg by mouth daily.          BP 130/68  Pulse 103  Temp(Src) 98.2 F (36.8 C) (Oral)  Resp 20  SpO2 97%  LMP 06/07/2013 Physical Exam  Nursing note and vitals reviewed. Constitutional: She is oriented to person, place, and time. She appears well-developed and well-nourished. No distress.  Obese  HENT:  Head: Normocephalic and atraumatic.  Eyes: Pupils are equal, round, and  reactive to light.  Neck: Neck supple.  Cardiovascular: Normal rate, regular rhythm and normal heart sounds.   No murmur heard. Pulmonary/Chest: Effort normal and breath sounds normal. No respiratory distress. She exhibits no tenderness.  Abdominal: Soft. Bowel sounds are normal. There is no tenderness.  Musculoskeletal:  Trace bilateral lower extremity edema  Neurological: She is alert and oriented to person, place, and time.  Skin: Skin is warm and dry.  Psychiatric: She has a normal mood and affect.    ED Course  Procedures (including critical care time) Labs Review Labs Reviewed  CBC WITH DIFFERENTIAL - Abnormal; Notable for the following:    MCV 76.7 (*)    MCHC 36.3 (*)    All other components within normal limits  COMPREHENSIVE METABOLIC PANEL - Abnormal; Notable  for the following:    Potassium 3.4 (*)    Glucose, Bld 334 (*)    Creatinine, Ser 0.44 (*)    Albumin 3.2 (*)    Total Bilirubin 0.2 (*)    All other components within normal limits  D-DIMER, QUANTITATIVE  POCT I-STAT TROPONIN I   Imaging Review Dg Chest 2 View  06/26/2013   CLINICAL DATA:  Chest pain and shortness of breath  EXAM: CHEST  2 VIEW  COMPARISON:  02/02/2013  FINDINGS: The heart size and mediastinal contours are within normal limits. Both lungs are clear. The visualized skeletal structures are unremarkable.  IMPRESSION: No active cardiopulmonary disease.   Electronically Signed   By: Esperanza Heir M.D.   On: 06/26/2013 21:44    EKG Interpretation     Ventricular Rate:  103 PR Interval:  192 QRS Duration: 86 QT Interval:  346 QTC Calculation: 453 R Axis:   27 Text Interpretation:  Sinus tachycardia Consider left atrial enlargement Nonspecific T wave abnormality Inferior leads No significant change since last tracing            MDM   1. Chest pain   2. Type II or unspecified type diabetes mellitus without mention of complication, uncontrolled     This a 37 year old female who presents with chest pain. She is nontoxic-appearing on exam. Vital signs notable for tachycardia with a rate of 103. Chest pain is not reproducible on exam. Patient has risk factors for ACS including diabetes, current smoking, and a early family history. Basic labwork was obtained. EKG is unchanged from prior. Chest x-ray shows no significant abnormality. Initial troponin is negative. Nitroglycerin paste was placed.  Patient to be admitted for serial enzymes.    Shon Baton, MD 06/27/13 [phone]

## 2013-06-27 ENCOUNTER — Encounter: Payer: Self-pay | Admitting: Family Medicine

## 2013-06-27 ENCOUNTER — Encounter (HOSPITAL_COMMUNITY): Payer: Self-pay | Admitting: Internal Medicine

## 2013-06-27 DIAGNOSIS — R079 Chest pain, unspecified: Secondary | ICD-10-CM | POA: Diagnosis present

## 2013-06-27 DIAGNOSIS — R072 Precordial pain: Secondary | ICD-10-CM

## 2013-06-27 LAB — GLUCOSE, CAPILLARY
Glucose-Capillary: 313 mg/dL — ABNORMAL HIGH (ref 70–99)
Glucose-Capillary: 330 mg/dL — ABNORMAL HIGH (ref 70–99)
Glucose-Capillary: 261 mg/dL — ABNORMAL HIGH (ref 70–99)
Glucose-Capillary: 283 mg/dL — ABNORMAL HIGH (ref 70–99)

## 2013-06-27 LAB — CBC
HCT: 36.1 % (ref 36.0–46.0)
Hemoglobin: 12.8 g/dL (ref 12.0–15.0)
MCH: 27.3 pg (ref 26.0–34.0)
MCHC: 35.5 g/dL (ref 30.0–36.0)
MCV: 77 fL — ABNORMAL LOW (ref 78.0–100.0)
Platelets: 281 10*3/uL (ref 150–400)

## 2013-06-27 LAB — HEMOGLOBIN A1C
Hgb A1c MFr Bld: 9.9 % — ABNORMAL HIGH (ref <5.7)
Mean Plasma Glucose: 237 mg/dL — ABNORMAL HIGH (ref <117)

## 2013-06-27 LAB — CREATININE, SERUM: GFR calc non Af Amer: >90 mL/min (ref >90)

## 2013-06-27 LAB — T3, FREE: T3, Free: 3.9 pg/mL (ref 2.3–4.2)

## 2013-06-27 LAB — TROPONIN I
Troponin I: <0.3 ng/mL (ref <0.30)
Troponin I: <0.3 ng/mL (ref <0.30)

## 2013-06-27 MED ORDER — LINAGLIPTIN 5 MG PO TABS
5.0000 mg | ORAL_TABLET | Freq: Every day | ORAL | Status: DC
  Administered 2013-06-27: 11:00:00 5 mg via ORAL
  Filled 2013-06-27: qty 1

## 2013-06-27 MED ORDER — ENOXAPARIN SODIUM 40 MG/0.4ML ~~LOC~~ SOLN
40.0000 mg | SUBCUTANEOUS | Status: DC
  Administered 2013-06-27: 11:00:00 40 mg via SUBCUTANEOUS
  Filled 2013-06-27: qty 0.4

## 2013-06-27 MED ORDER — IBUPROFEN 800 MG PO TABS: 800.0000 mg | ORAL_TABLET | Freq: Three times a day (TID) | ORAL | Status: DC

## 2013-06-27 MED ORDER — ACETAMINOPHEN 325 MG PO TABS
650.0000 mg | ORAL_TABLET | ORAL | Status: DC | PRN
  Administered 2013-06-27: 650 mg via ORAL
  Filled 2013-06-27 (×2): qty 2

## 2013-06-27 MED ORDER — INSULIN DETEMIR 100 UNIT/ML ~~LOC~~ SOLN
50.0000 [IU] | Freq: Every day | SUBCUTANEOUS | Status: DC
  Administered 2013-06-27: 50 [IU] via SUBCUTANEOUS
  Filled 2013-06-27: qty 0.5

## 2013-06-27 MED ORDER — INSULIN ASPART 100 UNIT/ML ~~LOC~~ SOLN
0.0000 [IU] | Freq: Three times a day (TID) | SUBCUTANEOUS | Status: DC
  Administered 2013-06-27: 09:00:00 7 [IU] via SUBCUTANEOUS
  Administered 2013-06-27 (×2): 5 [IU] via SUBCUTANEOUS

## 2013-06-27 MED ORDER — ASPIRIN EC 325 MG PO TBEC
325.0000 mg | DELAYED_RELEASE_TABLET | Freq: Every day | ORAL | Status: DC
  Administered 2013-06-27: 11:00:00 325 mg via ORAL
  Filled 2013-06-27: qty 1

## 2013-06-27 MED ORDER — IBUPROFEN 800 MG PO TABS
800.0000 mg | ORAL_TABLET | Freq: Three times a day (TID) | ORAL | Status: DC
  Administered 2013-06-27: 800 mg via ORAL
  Filled 2013-06-27 (×2): qty 1

## 2013-06-27 MED ORDER — MORPHINE SULFATE 2 MG/ML IJ SOLN
2.0000 mg | INTRAMUSCULAR | Status: DC | PRN
  Administered 2013-06-27 (×2): 2 mg via INTRAVENOUS
  Filled 2013-06-27 (×2): qty 1

## 2013-06-27 MED ORDER — ONDANSETRON HCL 4 MG/2ML IJ SOLN: 4.0000 mg | Freq: Four times a day (QID) | INTRAMUSCULAR | Status: DC | PRN

## 2013-06-27 MED ORDER — PNEUMOCOCCAL VAC POLYVALENT 25 MCG/0.5ML IJ INJ
0.5000 mL | INJECTION | Freq: Once | INTRAMUSCULAR | Status: AC
  Administered 2013-06-27: 11:00:00 0.5 mL via INTRAMUSCULAR
  Filled 2013-06-27: qty 0.5

## 2013-06-27 NOTE — H&P (Signed)
[facility] History and Physical  NETTIE WYFFELS DGU:[id number] DOB: 17-Dec-1975 DOA: 06/26/2013  Referring physician: ER physician. PCP: Sanda Linger, MD   Chief Complaint: Chest pain.  HPI: Maureen Price is a 37 y.o. female with history of diabetes mellitus type 2 and ongoing tobacco abuse presented to the ER because of chest pain. Patient's chest pain has been present since morning yesterday. Pain is on the left anterior chest wall. Pressure-like increased on exertion and also on moving her left hand. Chest x-ray EKG and cardiac markers were negative. Patient has mild sinus tachycardia. D-dimer is negative. Due to the patient's risk factors and family history patient has been admitted to for further observation. Patient in the date have one episode of nausea and vomiting. Did not have any abdominal pain or diarrhea. Presently on exam patient abdomen appears benign.   Review of Systems: As presented in the history of presenting illness, rest negative.  Past Medical History  Diagnosis Date  . Diabetes mellitus     2002; no meds since 2004   Past Surgical History  Procedure Laterality Date  . Cesarean section    . Gallbladder surgery    . Tubal ligation      2000  . Cesarean section  [phone]   Social History:  reports that she has been smoking Cigarettes.  She has a 20 pack-year smoking history. She does not have any smokeless tobacco history on file. She reports that she does not drink alcohol or use illicit drugs. Where does patient live home. Can patient participate in ADLs? Yes.  Allergies  Allergen Reactions  . Erythromycin Anaphylaxis  . Penicillins Anaphylaxis    Family History:  Family History  Problem Relation Age of Onset  . Arthritis Mother   . Diabetes Mother   . Heart disease Father   . Stroke Father   . Cancer Neg Hx   . Alcohol abuse Neg Hx   . Early death Neg Hx   . Kidney disease Neg Hx   . Hypertension Neg Hx   .  Hyperlipidemia Neg Hx       Prior to Admission medications   Medication Sig Start Date End Date Taking? Authorizing Provider  aspirin-acetaminophen-caffeine (EXCEDRIN MIGRAINE) [phone] MG per tablet Take 2 tablets by mouth every 6 (six) hours as needed for pain.   Yes Historical Provider, MD  insulin detemir (LEVEMIR) 100 UNIT/ML injection Inject 50 Units into the skin daily.   Yes Historical Provider, MD  sitaGLIPtin (JANUVIA) 50 MG tablet Take 50 mg by mouth daily.   Yes Historical Provider, MD    Physical Exam: Filed Vitals:   06/26/13 2041 06/26/13 2318  BP:  130/68  Pulse: 103   Temp: 98.2 F (36.8 C)   TempSrc: Oral   Resp: 17 20  SpO2: 97%      General:  Well-developed and nourished.  Eyes: Anicteric no pallor.  ENT: No discharge from ears eyes nose mouth.  Neck: No mass felt.  Cardiovascular: S1-S2 heard.  Respiratory: No rhonchi or crepitations.  Abdomen: Soft nontender bowel sounds present.  Skin: No rash.  Musculoskeletal: No edema.  Psychiatric: Appears normal.  Neurologic: Alert and oriented to time place and person. Moves all extremities.  Labs on Admission:  Basic Metabolic Panel:  Recent Labs Lab 06/26/13 2107  NA 135  K 3.4*  CL 100  CO2 25  GLUCOSE 334*  BUN 7  CREATININE 0.44*  CALCIUM 8.9   Liver Function Tests:  Recent Labs Lab  06/26/13 2107  AST 13  ALT 13  ALKPHOS 111  BILITOT 0.2*  PROT 6.7  ALBUMIN 3.2*   No results found for this basename: LIPASE, AMYLASE,  in the last 168 hours No results found for this basename: AMMONIA,  in the last 168 hours CBC:  Recent Labs Lab 06/26/13 2107  WBC 8.4  NEUTROABS 4.4  HGB 13.6  HCT 37.5  MCV 76.7*  PLT 296   Cardiac Enzymes: No results found for this basename: CKTOTAL, CKMB, CKMBINDEX, TROPONINI,  in the last 168 hours  BNP (last 3 results) No results found for this basename: PROBNP,  in the last 8760 hours CBG: No results found for this basename: GLUCAP,  in  the last 168 hours  Radiological Exams on Admission: Dg Chest 2 View  06/26/2013   CLINICAL DATA:  Chest pain and shortness of breath  EXAM: CHEST  2 VIEW  COMPARISON:  02/02/2013  FINDINGS: The heart size and mediastinal contours are within normal limits. Both lungs are clear. The visualized skeletal structures are unremarkable.  IMPRESSION: No active cardiopulmonary disease.   Electronically Signed   By: Esperanza Heir M.D.   On: 06/26/2013 21:44    EKG: Independently reviewed. Sinus tachycardia with nonspecific T-wave changes.  Assessment/Plan Principal Problem:   Chest pain Active Problems:   Type II or unspecified type diabetes mellitus without mention of complication, uncontrolled   1. Chest pain - has both typical and atypical symptoms. Check 2-D echo. Cycle cardiac markers. Aspirin. 2. Sinus tachycardia - d-dimer has been negative. Check thyroid function test. 3. Diabetes mellitus type 2 uncontrolled - not in DKA. Closely follow CBGs. Check hemoglobin A1c. Continue present home medications. 4. Tobacco abuse - strongly advised to quit smoking.    Code Status: Full code.  Family Communication: None.  Disposition Plan: Admit for observation.    Emi Lymon N. [facility] Pager [phone].  If 7PM-7AM, please contact night-coverage [email or web address] Password [facility] 06/27/2013, 12:31 AM

## 2013-06-27 NOTE — Progress Notes (Signed)
Echocardiogram 2D Echocardiogram has been performed.  Horace Wishon 06/27/2013, 9:49 AM

## 2013-06-27 NOTE — Discharge Summary (Addendum)
Physician Discharge Summary  Maureen Price WUJ:[id number] DOB: 07-09-1976 DOA: 06/26/2013  PCP: Sanda Linger, MD  Admit date: 06/26/2013 Discharge date: 06/27/2013  Time spent: 20 minutes  Recommendations for Outpatient Follow-up:  1. Patient will have occupational therapy evaluate her for frozen shoulder, rotator cuff tendinitis, pectoralis minor strain sprain-patient will get exercises to do a home for this and should followup probably with therapy as an outpatient continue ibuprofen.  Discharge Diagnoses:  Principal Problem:   Chest pain Active Problems:   Type II or unspecified type diabetes mellitus without mention of complication, uncontrolled   Discharge Condition: good  Diet recommendation: diabetic  Filed Weights   06/27/13 0230  Weight: 111.721 kg (246 lb 4.8 oz)    History of present illness:  This 37 year old morbidly obese African American female was admitted early this morning with chest pain. Her heart score on admission was~ 2-3.  troponins were cycled EKG was done which was nonrevealing for any acute ischemia. Echocardiogram was performed which was pending at time of discharge. On exam on day of discharge, was noted that specific weight tenderness of the air left chest area and pectoral region she also had pain with external rotation and action and placement of the hand behind her head and behind back. It was felt that the majority of her pain is musculoskeletal and that she would benefit from either occupational physical therapy input with regards to exercises to help relieve this. Patient in fact did receive occupational therapy while in hospital and it was noted that she had used 9 as well as Donia Pounds is muscle strain and sprain and after myofascial release as well as mobilization, her pain level decreased from 6-7 on 10-2/10 and patient was given instructions to use heat and specific stretches for that area.  She was discharged home with the dose of ibuprofen  800 mg 3 times a day for 2-3 days to help with the pain and was instructed to followup at primary care physician for further management. Her diabetes mellitus was well controlled in the hospital she will continue her prior to medications   Discharge Exam: Filed Vitals:   06/27/13 1315  BP: 105/67  Pulse: 90  Temp: 98.2 F (36.8 C)  Resp: 20  alert, states that she has pain in her chest not relieved by morphine. Excellent tolerated diet no nausea vomiting no shortness of breath no radiation of pain down the left arm radiation pain into the neck  General: Morbid obesity, Body mass index is 41.64 kg/(m^2). Cardiovascular: S1-S2 no murmur or gallop Respiratory: clinically clear  Discharge Instructions     Medication List         aspirin-acetaminophen-caffeine 250-250-65 MG per tablet  Commonly known as:  EXCEDRIN MIGRAINE  Take 2 tablets by mouth every 6 (six) hours as needed for pain.     ibuprofen 800 MG tablet  Commonly known as:  ADVIL,MOTRIN  Take 1 tablet (800 mg total) by mouth 3 (three) times daily with meals.     insulin detemir 100 UNIT/ML injection  Commonly known as:  LEVEMIR  Inject 50 Units into the skin daily.     sitaGLIPtin 50 MG tablet  Commonly known as:  JANUVIA  Take 50 mg by mouth daily.       Allergies  Allergen Reactions  . Erythromycin Anaphylaxis  . Penicillins Anaphylaxis      The results of significant diagnostics from this hospitalization (including imaging, microbiology, ancillary and laboratory) are listed below for reference.  Significant Diagnostic Studies: Dg Chest 2 View  06/26/2013   CLINICAL DATA:  Chest pain and shortness of breath  EXAM: CHEST  2 VIEW  COMPARISON:  02/02/2013  FINDINGS: The heart size and mediastinal contours are within normal limits. Both lungs are clear. The visualized skeletal structures are unremarkable.  IMPRESSION: No active cardiopulmonary disease.   Electronically Signed   By: Esperanza Heir M.D.    On: 06/26/2013 21:44    Microbiology: No results found for this or any previous visit (from the past 240 hour(s)).   Labs: Basic Metabolic Panel:  Recent Labs Lab 06/26/13 2107 06/27/13 0315  NA 135  --   K 3.4*  --   CL 100  --   CO2 25  --   GLUCOSE 334*  --   BUN 7  --   CREATININE 0.44* 0.49*  CALCIUM 8.9  --    Liver Function Tests:  Recent Labs Lab 06/26/13 2107  AST 13  ALT 13  ALKPHOS 111  BILITOT 0.2*  PROT 6.7  ALBUMIN 3.2*   No results found for this basename: LIPASE, AMYLASE,  in the last 168 hours No results found for this basename: AMMONIA,  in the last 168 hours CBC:  Recent Labs Lab 06/26/13 2107 06/27/13 0315  WBC 8.4 9.4  NEUTROABS 4.4  --   HGB 13.6 12.8  HCT 37.5 36.1  MCV 76.7* 77.0*  PLT 296 281   Cardiac Enzymes:  Recent Labs Lab 06/27/13 0315 06/27/13 0810  TROPONINI <0.30 <0.30   BNP: BNP (last 3 results) No results found for this basename: PROBNP,  in the last 8760 hours CBG:  Recent Labs Lab 06/27/13 0227 06/27/13 0557 06/27/13 0739 06/27/13 1143  GLUCAP 276* 313* 330* 261*       Signed:  Rhetta Mura  [facility] 06/27/2013, 2:26 PM

## 2013-06-27 NOTE — Evaluation (Signed)
Occupational Therapy Evaluation Patient Details Name: ICELYNN ONKEN MRN: [id number] DOB: [birth year] Today's Date: 06/27/2013 Time: 7829-5621 OT Time Calculation (min): 39 min  OT Assessment / Plan / Recommendation History of present illness 37 y.o. admitted with Lt. sided chest pain.  Cardiac work up negative - thought to be muskuloskeletal   Clinical Impression   Pt admitted with above.  Pt with pain 8/10 at beginning of session.  Myofascial release and soft tissue mobs performed Lt. Anterior chest and Lt. UE with pain improved to 2/10.  Pt instructed in HEP.  No further OT needs, no f/u recommended     OT Assessment  Patient does not need any further OT services    Follow Up Recommendations  No OT follow up    Barriers to Discharge      Equipment Recommendations  None recommended by OT    Recommendations for Other Services    Frequency       Precautions / Restrictions Precautions Precautions: None   Pertinent Vitals/Pain     ADL  Eating/Feeding: Independent Where Assessed - Eating/Feeding: Edge of bed Grooming: Wash/dry hands;Wash/dry face;Teeth care;Brushing hair;Set up Where Assessed - Grooming: Unsupported sitting ADL Comments: Pt reports she is able to perform BADLs, but has pain 8/10.  Myofascial release and gentle soft tissue mobilization performed to Lt. anterior chest area and Lt. UE.  She was instructed to apply heat to area, instructed her on wall stretches, and arms behind back,  and to take anit inflammatories as instructed by MD.  Pt reports pain considerably better 2/10.      OT Diagnosis:    OT Problem List:   OT Treatment Interventions:     OT Goals(Current goals can be found in the care plan section)    Visit Information  Last OT Received On: 06/27/13 Assistance Needed: +1 History of Present Illness: 37 y.o. admitted with Lt. sided chest pain.  Cardiac work up negative - thought to be muskuloskeletal       Prior Functioning     Home  Living Family/patient expects to be discharged to:: Private residence Living Arrangements: Spouse/significant other Available Help at Discharge: Family;Available PRN/intermittently Prior Function Level of Independence: Independent Comments: works as Lawyer full time at [employer]: No difficulties Dominant Hand: Right         Vision/Perception     Cognition  Cognition Arousal/Alertness: Awake/alert Behavior During Therapy: WFL for tasks assessed/performed Overall Cognitive Status: Within Functional Limits for tasks assessed    Extremity/Trunk Assessment Upper Extremity Assessment Upper Extremity Assessment: LUE deficits/detail LUE Deficits / Details: Full ROM, but pain 8/10 Lt chest that radiates into shoulder.  Trigger point noted proximal to pecs insertion.  Smaller trigger points noted throughout pecs and teres major.  pain 8/10 Cervical / Trunk Assessment Cervical / Trunk Assessment: Normal     Mobility Bed Mobility Bed Mobility: Supine to Sit;Sitting - Scoot to Edge of Bed Supine to Sit: 7: Independent Sitting - Scoot to [place] of Bed: 7: Independent     Exercise Other Exercises Other Exercises: wall/corner stretches to stretch int. rotators Other Exercises: Arms behind back to stretch int. rotators   Balance     End of Session OT - End of Session Activity Tolerance: Patient tolerated treatment well Patient left: in bed;with call bell/phone within reach;with family/visitor present  GO     Meaghen Vecchiarelli, Ursula Alert M 06/27/2013, 5:09 PM

## 2013-06-27 NOTE — Care Management Note (Addendum)
    Page 1 of 1   06/27/2013     1:45:46 PM   CARE MANAGEMENT NOTE 06/27/2013  Patient:  Maureen Price, Maureen Price   Account Number:  [id number]  Date Initiated:  06/27/2013  Documentation initiated by:  Lanier Clam  Subjective/Objective Assessment:   37 Y/O F ADMITTED W/CHEST PAIN.     Action/Plan:   FROM HOME.HAS PCP,PHARMACY.   Anticipated DC Date:  06/27/2013   Anticipated DC Plan:  HOME/SELF CARE      DC Planning Services  CM consult      Choice offered to / List presented to:             Status of service:  In process, will continue to follow Medicare Important Message given?   (If response is "NO", the following Medicare IM given date fields will be blank) Date Medicare IM given:   Date Additional Medicare IM given:    Discharge Disposition:    Per UR Regulation:  Reviewed for med. necessity/level of care/duration of stay  If discussed at Long Length of Stay Meetings, dates discussed:    Comments:  06/27/13 Taiven Greenley RN,BSN NCM [phone] BENEFIT CHECKED FOR LEVEMIR,& NOVOLOG.CO PAY $10 FOR BOTH MEDS.NO ANTICIPATED D/C NEEDS.

## 2013-06-27 NOTE — Progress Notes (Signed)
Occupational Therapy Evaluation Addendum   07/26/2013 1700  OT G-codes **NOT FOR INPATIENT CLASS**  Functional Limitation Self care  Self Care Current Status [phone]) CI  Self Care Goal Status ([id number]) [facility]  Self Care Discharge Status [phone]) [facility]   Jeani Hawking, OTR/L [phone]

## 2013-08-16 ENCOUNTER — Other Ambulatory Visit: Payer: Self-pay | Admitting: Internal Medicine

## 2013-08-18 ENCOUNTER — Other Ambulatory Visit: Payer: Self-pay

## 2013-08-18 MED ORDER — INSULIN DETEMIR 100 UNIT/ML ~~LOC~~ SOLN: 50.0000 [IU] | Freq: Every day | SUBCUTANEOUS | Status: DC

## 2013-08-18 NOTE — Telephone Encounter (Signed)
Refilled for 1 pen only and notified no additional refills without appointment with MD.

## 2013-08-25 ENCOUNTER — Encounter: Payer: Self-pay | Admitting: Internal Medicine

## 2013-08-25 ENCOUNTER — Ambulatory Visit (INDEPENDENT_AMBULATORY_CARE_PROVIDER_SITE_OTHER): Payer: BC Managed Care – PPO | Admitting: Internal Medicine

## 2013-08-25 ENCOUNTER — Other Ambulatory Visit (INDEPENDENT_AMBULATORY_CARE_PROVIDER_SITE_OTHER): Payer: BC Managed Care – PPO

## 2013-08-25 VITALS — BP 114/68 | HR 103 | Temp 97.8°F | Resp 16 | Ht 64.5 in | Wt 243.0 lb

## 2013-08-25 DIAGNOSIS — M545 Low back pain: Secondary | ICD-10-CM

## 2013-08-25 DIAGNOSIS — F329 Major depressive disorder, single episode, unspecified: Secondary | ICD-10-CM

## 2013-08-25 LAB — CBC WITH DIFFERENTIAL/PLATELET
Basophils Absolute: 0 10*3/uL (ref 0.0–0.1)
Basophils Relative: 0.4 % (ref 0.0–3.0)
Eosinophils Absolute: 0.1 10*3/uL (ref 0.0–0.7)
HCT: 42.3 % (ref 36.0–46.0)
Hemoglobin: 14.4 g/dL (ref 12.0–15.0)
Lymphs Abs: 3.4 10*3/uL (ref 0.7–4.0)
MCHC: 34 g/dL (ref 30.0–36.0)
MCV: 80 fl (ref 78.0–100.0)
Monocytes Relative: 5.5 % (ref 3.0–12.0)
Neutro Abs: 6.1 10*3/uL (ref 1.4–7.7)
Platelets: 298 10*3/uL (ref 150.0–400.0)
RBC: 5.3 Mil/uL — ABNORMAL HIGH (ref 3.87–5.11)
RDW: 14.6 % (ref 11.5–14.6)
WBC: 10.3 10*3/uL (ref 4.5–10.5)

## 2013-08-25 LAB — LIPID PANEL
HDL: 23.9 mg/dL — ABNORMAL LOW (ref >39.00)
LDL Cholesterol: 119 mg/dL — ABNORMAL HIGH (ref 0–99)
Total CHOL/HDL Ratio: 7
Triglycerides: 68 mg/dL (ref 0.0–149.0)
VLDL: 13.6 mg/dL (ref 0.0–40.0)

## 2013-08-25 LAB — COMPREHENSIVE METABOLIC PANEL
ALT: 19 U/L (ref 0–35)
AST: 16 U/L (ref 0–37)
Alkaline Phosphatase: 98 U/L (ref 39–117)
Creatinine, Ser: 0.5 mg/dL (ref 0.4–1.2)
GFR: 191.45 mL/min (ref >60.00)
Potassium: 3.7 mEq/L (ref 3.5–5.1)
Total Bilirubin: 0.3 mg/dL (ref 0.3–1.2)

## 2013-08-25 LAB — HEMOGLOBIN A1C: Hgb A1c MFr Bld: 10.4 % — ABNORMAL HIGH (ref 4.6–6.5)

## 2013-08-25 MED ORDER — INSULIN DETEMIR 100 UNIT/ML ~~LOC~~ SOLN: 50.0000 [IU] | Freq: Every day | SUBCUTANEOUS | Status: DC

## 2013-08-25 MED ORDER — VILAZODONE HCL 10 & 20 & 40 MG PO KIT: 1.0000 | PACK | Freq: Every day | ORAL | Status: DC

## 2013-08-25 MED ORDER — SITAGLIP PHOS-METFORMIN HCL ER 50-1000 MG PO TB24: 2.0000 | ORAL_TABLET | Freq: Every day | ORAL | Status: DC

## 2013-08-25 MED ORDER — GLUCOSE BLOOD VI STRP: ORAL_STRIP | Status: DC

## 2013-08-25 MED ORDER — ONETOUCH VERIO IQ SYSTEM W/DEVICE KIT: 1.0000 | PACK | Freq: Three times a day (TID) | Status: DC

## 2013-08-25 NOTE — Progress Notes (Signed)
Subjective:    Patient ID: Maureen Price, female    DOB: 11/06/1975, 37 y.o.   MRN: [id number]  Diabetes She presents for her follow-up diabetic visit. She has type 2 diabetes mellitus. Her disease course has been worsening. Hypoglycemia symptoms include nervousness/anxiousness. Pertinent negatives for hypoglycemia include no confusion or dizziness. Associated symptoms include polydipsia, polyphagia and polyuria. Pertinent negatives for diabetes include no blurred vision, no chest pain, no fatigue, no foot paresthesias, no foot ulcerations, no visual change, no weakness and no weight loss. There are no hypoglycemic complications. There are no diabetic complications. Current diabetic treatment includes oral agent (dual therapy) and insulin injections. She is compliant with treatment some of the time. Her weight is stable. She is following a generally unhealthy diet. When asked about meal planning, she reported none. She has not had a previous visit with a dietician. She participates in exercise intermittently. Her home blood glucose trend is decreasing steadily. An ACE inhibitor/angiotensin II receptor blocker is not being taken. She does not see a podiatrist.Eye exam is not current.      Review of Systems  Constitutional: Positive for unexpected weight change. Negative for fever, chills, weight loss, diaphoresis, activity change, appetite change and fatigue.  HENT: Negative.   Eyes: Negative.  Negative for blurred vision.  Respiratory: Negative.  Negative for cough, choking, chest tightness, shortness of breath, wheezing and stridor.   Cardiovascular: Negative for chest pain, palpitations and leg swelling.  Gastrointestinal: Negative.  Negative for nausea, vomiting, abdominal pain, diarrhea, constipation and blood in stool.  Endocrine: Positive for polydipsia, polyphagia and polyuria.  Genitourinary: Negative.   Musculoskeletal: Positive for back pain. Negative for arthralgias, gait  problem, joint swelling, myalgias, neck pain and neck stiffness.       She has chronic (> one year) aching, non-radiating LBP  Skin: Negative.   Allergic/Immunologic: Negative.   Neurological: Negative.  Negative for dizziness and weakness.  Hematological: Negative.  Negative for adenopathy. Does not bruise/bleed easily.  Psychiatric/Behavioral: Positive for sleep disturbance and dysphoric mood. Negative for suicidal ideas, hallucinations, behavioral problems, confusion, self-injury, decreased concentration and agitation. The patient is nervous/anxious. The patient is not hyperactive.        She has crying spells, anhedonia, weight gain, anxiety, and sadness.       Objective:   Physical Exam  Vitals reviewed. Constitutional: She is oriented to person, place, and time. She appears well-developed and well-nourished. No distress.  HENT:  Head: Normocephalic and atraumatic.  Mouth/Throat: Oropharynx is clear and moist. No oropharyngeal exudate.  Eyes: Conjunctivae are normal. Right eye exhibits no discharge. Left eye exhibits no discharge. No scleral icterus.  Neck: Normal range of motion. Neck supple. No JVD present. No tracheal deviation present. No thyromegaly present.  Cardiovascular: Normal rate, regular rhythm, normal heart sounds and intact distal pulses.  Exam reveals no gallop and no friction rub.   No murmur heard. Pulmonary/Chest: Effort normal and breath sounds normal. No stridor. No respiratory distress. She has no wheezes. She has no rales. She exhibits no tenderness.  Abdominal: Soft. Bowel sounds are normal. She exhibits no distension and no mass. There is no tenderness. There is no rebound and no guarding.  Musculoskeletal: Normal range of motion. She exhibits no edema and no tenderness.  Lymphadenopathy:    She has no cervical adenopathy.  Neurological: She is oriented to person, place, and time.  Skin: Skin is warm and dry. No rash noted. She is not diaphoretic. No  erythema. No pallor.  Psychiatric: Her speech is normal and behavior is normal. Judgment and thought content normal. Her mood appears anxious. Her affect is not angry, not blunt, not labile and not inappropriate. Cognition and memory are normal. She exhibits a depressed mood.  She is tearful     Lab Results  Component Value Date   WBC 9.4 06/27/2013   HGB 12.8 06/27/2013   HCT 36.1 06/27/2013   PLT 281 06/27/2013   GLUCOSE 334* 06/26/2013   CHOL 178 07/07/2012   TRIG 116.0 07/07/2012   HDL 22.80* 07/07/2012   LDLCALC 132* 07/07/2012   ALT 13 06/26/2013   AST 13 06/26/2013   NA 135 06/26/2013   K 3.4* 06/26/2013   CL 100 06/26/2013   CREATININE 0.49* 06/27/2013   BUN 7 06/26/2013   CO2 25 06/26/2013   TSH 3.826 06/27/2013   INR 1.02 11/22/2012   HGBA1C 9.9* 06/27/2013   MICROALBUR 1.1 07/07/2012       Assessment & Plan:

## 2013-08-25 NOTE — Patient Instructions (Signed)
Type 2 Diabetes Mellitus, Adult Type 2 diabetes mellitus, often simply referred to as type 2 diabetes, is a long-lasting (chronic) disease. In type 2 diabetes, the pancreas does not make enough insulin (a hormone), the cells are less responsive to the insulin that is made (insulin resistance), or both. Normally, insulin moves sugars from food into the tissue cells. The tissue cells use the sugars for energy. The lack of insulin or the lack of normal response to insulin causes excess sugars to build up in the blood instead of going into the tissue cells. As a result, high blood sugar (hyperglycemia) develops. The effect of high sugar (glucose) levels can cause many complications. Type 2 diabetes was also previously called adult-onset diabetes but it can occur at any age.  RISK FACTORS  A person is predisposed to developing type 2 diabetes if someone in the family has the disease and also has one or more of the following primary risk factors:  Overweight.  An inactive lifestyle.  A history of consistently eating high-calorie foods. Maintaining a normal weight and regular physical activity can reduce the chance of developing type 2 diabetes. SYMPTOMS  A person with type 2 diabetes may not show symptoms initially. The symptoms of type 2 diabetes appear slowly. The symptoms include:  Increased thirst (polydipsia).  Increased urination (polyuria).  Increased urination during the night (nocturia).  Weight loss. This weight loss may be rapid.  Frequent, recurring infections.  Tiredness (fatigue).  Weakness.  Vision changes, such as blurred vision.  Fruity smell to your breath.  Abdominal pain.  Nausea or vomiting.  Cuts or bruises which are slow to heal.  Tingling or numbness in the hands or feet. DIAGNOSIS Type 2 diabetes is frequently not diagnosed until complications of diabetes are present. Type 2 diabetes is diagnosed when symptoms or complications are present and when blood  glucose levels are increased. Your blood glucose level may be checked by one or more of the following blood tests:  A fasting blood glucose test. You will not be allowed to eat for at least 8 hours before a blood sample is taken.  A random blood glucose test. Your blood glucose is checked at any time of the day regardless of when you ate.  A hemoglobin A1c blood glucose test. A hemoglobin A1c test provides information about blood glucose control over the previous 3 months.  An oral glucose tolerance test (OGTT). Your blood glucose is measured after you have not eaten (fasted) for 2 hours and then after you drink a glucose-containing beverage. TREATMENT   You may need to take insulin or diabetes medicine daily to keep blood glucose levels in the desired range.  You will need to match insulin dosing with exercise and healthy food choices. The treatment goal is to maintain the before meal blood sugar (preprandial glucose) level at 70 130 mg/dL. HOME CARE INSTRUCTIONS   Have your hemoglobin A1c level checked twice a year.  Perform daily blood glucose monitoring as directed by your caregiver.  Monitor urine ketones when you are ill and as directed by your caregiver.  Take your diabetes medicine or insulin as directed by your caregiver to maintain your blood glucose levels in the desired range.  Never run out of diabetes medicine or insulin. It is needed every day.  Adjust insulin based on your intake of carbohydrates. Carbohydrates can raise blood glucose levels but need to be included in your diet. Carbohydrates provide vitamins, minerals, and fiber which are an essential part of   a healthy diet. Carbohydrates are found in fruits, vegetables, whole grains, dairy products, legumes, and foods containing added sugars.    Eat healthy foods. Alternate 3 meals with 3 snacks.  Lose weight if overweight.  Carry a medical alert card or wear your medical alert jewelry.  Carry a 15 gram  carbohydrate snack with you at all times to treat low blood glucose (hypoglycemia). Some examples of 15 gram carbohydrate snacks include:  Glucose tablets, 3 or 4   Glucose gel, 15 gram tube  Raisins, 2 tablespoons (24 grams)  Jelly beans, 6  Animal crackers, 8  Regular pop, 4 ounces (120 mL)  Gummy treats, 9  Recognize hypoglycemia. Hypoglycemia occurs with blood glucose levels of 70 mg/dL and below. The risk for hypoglycemia increases when fasting or skipping meals, during or after intense exercise, and during sleep. Hypoglycemia symptoms can include:  Tremors or shakes.  Decreased ability to concentrate.  Sweating.  Increased heart rate.  Headache.  Dry mouth.  Hunger.  Irritability.  Anxiety.  Restless sleep.  Altered speech or coordination.  Confusion.  Treat hypoglycemia promptly. If you are alert and able to safely swallow, follow the 15:15 rule:  Take 15 20 grams of rapid-acting glucose or carbohydrate. Rapid-acting options include glucose gel, glucose tablets, or 4 ounces (120 mL) of fruit juice, regular soda, or low fat milk.  Check your blood glucose level 15 minutes after taking the glucose.  Take 15 20 grams more of glucose if the repeat blood glucose level is still 70 mg/dL or below.  Eat a meal or snack within 1 hour once blood glucose levels return to normal.    Be alert to polyuria and polydipsia which are early signs of hyperglycemia. An early awareness of hyperglycemia allows for prompt treatment. Treat hyperglycemia as directed by your caregiver.  Engage in at least 150 minutes of moderate-intensity physical activity a week, spread over at least 3 days of the week or as directed by your caregiver. In addition, you should engage in resistance exercise at least 2 times a week or as directed by your caregiver.  Adjust your medicine and food intake as needed if you start a new exercise or sport.  Follow your sick day plan at any time you  are unable to eat or drink as usual.  Avoid tobacco use.  Limit alcohol intake to no more than 1 drink per day for nonpregnant women and 2 drinks per day for men. You should drink alcohol only when you are also eating food. Talk with your caregiver whether alcohol is safe for you. Tell your caregiver if you drink alcohol several times a week.  Follow up with your caregiver regularly.  Schedule an eye exam soon after the diagnosis of type 2 diabetes and then annually.  Perform daily skin and foot care. Examine your skin and feet daily for cuts, bruises, redness, nail problems, bleeding, blisters, or sores. A foot exam by a caregiver should be done annually.  Brush your teeth and gums at least twice a day and floss at least once a day. Follow up with your dentist regularly.  Share your diabetes management plan with your workplace or school.  Stay up-to-date with immunizations.  Learn to manage stress.  Obtain ongoing diabetes education and support as needed.  Participate in, or seek rehabilitation as needed to maintain or improve independence and quality of life. Request a physical or occupational therapy referral if you are having foot or hand numbness or difficulties with grooming,   dressing, eating, or physical activity. SEEK MEDICAL CARE IF:   You are unable to eat food or drink fluids for more than 6 hours.  You have nausea and vomiting for more than 6 hours.  Your blood glucose level is over 240 mg/dL.  There is a change in mental status.  You develop an additional serious illness.  You have diarrhea for more than 6 hours.  You have been sick or have had a fever for a couple of days and are not getting better.  You have pain during any physical activity.  SEEK IMMEDIATE MEDICAL CARE IF:  You have difficulty breathing.  You have moderate to large ketone levels. MAKE SURE YOU:  Understand these instructions.  Will watch your condition.  Will get help right away if  you are not doing well or get worse. Document Released: 08/24/2005 Document Revised: 05/18/2012 Document Reviewed: 03/22/2012 [employer] Patient Information 2014 [employer].  

## 2013-08-25 NOTE — Progress Notes (Signed)
Pre visit review using our clinic review tool, if applicable. No additional management support is needed unless otherwise documented below in the visit note. 

## 2013-08-27 ENCOUNTER — Encounter: Payer: Self-pay | Admitting: Internal Medicine

## 2013-08-27 NOTE — Assessment & Plan Note (Signed)
This is poorly controlled Will treat with janumet and levemir She was referred for an eye exam and for diabetic education Today I will check her A1C and will monitor her renal function

## 2013-08-27 NOTE — Assessment & Plan Note (Signed)
I will treat this with Viibryd, she was given a started pak

## 2013-08-27 NOTE — Assessment & Plan Note (Signed)
She will see pain management for further evaluation

## 2013-09-01 ENCOUNTER — Encounter: Payer: Self-pay | Admitting: *Deleted

## 2013-09-01 ENCOUNTER — Telehealth: Payer: Self-pay | Admitting: Internal Medicine

## 2013-09-01 NOTE — Telephone Encounter (Signed)
ok 

## 2013-09-01 NOTE — Telephone Encounter (Signed)
09/01/2013 Pt came in to office today after speaking with the on-call during the night. Pt states that after increasing the dosage on their antidepressant medications, they became sluggish and could not stay awake. pt is requesting a letter stating they are on the Vilazodone HCl (VIIBRYD) 10 & 20 & 40 MG and that the medication calls for step up dosages. Pt had to go home from work last night (08/31/2013) and is needing this letter for work to show that they, are indeed, on the medication.

## 2013-09-01 NOTE — Telephone Encounter (Signed)
Please advise 

## 2013-09-01 NOTE — Telephone Encounter (Signed)
09/01/2013 Pt came in to office today after speaking with the on-call during the night. Pt states that after increasing the dosage on their antidepressant medications, they became sluggish and could not stay awake. pt is requesting a letter stating they are on the Vilazodone HCl (VIIBRYD) 10 & 20 & 40 MG and that the medication calls for step up dosages. Pt had to go home from work last night (08/31/2013) and is needing this letter for work to show that they, are indeed, on the medication.   ° ° °

## 2013-09-04 ENCOUNTER — Emergency Department (HOSPITAL_COMMUNITY)
Admission: EM | Admit: 2013-09-04 | Discharge: 2013-09-04 | Disposition: A | Discharge status: Home / Self Care | Payer: BC Managed Care – PPO | Attending: Emergency Medicine | Admitting: Emergency Medicine

## 2013-09-04 ENCOUNTER — Encounter (HOSPITAL_COMMUNITY): Payer: Self-pay | Admitting: Emergency Medicine

## 2013-09-04 ENCOUNTER — Emergency Department (HOSPITAL_COMMUNITY): Payer: BC Managed Care – PPO

## 2013-09-04 DIAGNOSIS — Z794 Long term (current) use of insulin: Secondary | ICD-10-CM | POA: Insufficient documentation

## 2013-09-04 DIAGNOSIS — Z880 Allergy status to penicillin: Secondary | ICD-10-CM | POA: Insufficient documentation

## 2013-09-04 DIAGNOSIS — Z79899 Other long term (current) drug therapy: Secondary | ICD-10-CM | POA: Insufficient documentation

## 2013-09-04 DIAGNOSIS — J209 Acute bronchitis, unspecified: Secondary | ICD-10-CM | POA: Insufficient documentation

## 2013-09-04 DIAGNOSIS — F17200 Nicotine dependence, unspecified, uncomplicated: Secondary | ICD-10-CM | POA: Insufficient documentation

## 2013-09-04 DIAGNOSIS — E119 Type 2 diabetes mellitus without complications: Secondary | ICD-10-CM | POA: Insufficient documentation

## 2013-09-04 DIAGNOSIS — J40 Bronchitis, not specified as acute or chronic: Secondary | ICD-10-CM

## 2013-09-04 MED ORDER — HYDROCOD POLST-CHLORPHEN POLST 10-8 MG/5ML PO LQCR
5.0000 mL | Freq: Once | ORAL | Status: AC
  Administered 2013-09-04: 5 mL via ORAL
  Filled 2013-09-04: qty 5

## 2013-09-04 MED ORDER — ALBUTEROL SULFATE HFA 108 (90 BASE) MCG/ACT IN AERS
2.0000 | INHALATION_SPRAY | Freq: Once | RESPIRATORY_TRACT | Status: AC
  Administered 2013-09-04: 2 via RESPIRATORY_TRACT
  Filled 2013-09-04: qty 6.7

## 2013-09-04 MED ORDER — LEVOFLOXACIN 500 MG PO TABS: 500.0000 mg | ORAL_TABLET | Freq: Every day | ORAL | Status: DC

## 2013-09-04 MED ORDER — HYDROCOD POLST-CHLORPHEN POLST 10-8 MG/5ML PO LQCR: 5.0000 mL | Freq: Two times a day (BID) | ORAL | Status: DC | PRN

## 2013-09-04 NOTE — ED Provider Notes (Signed)
CSN: [id number]     Arrival date & time 09/04/13  1656 History  This chart was scribed for non-physician practitioner, Izola Price. Marisue Humble, PA-C working with Nelia Shi, MD by Greggory Stallion, ED scribe. This patient was seen in room WTR9/WTR9 and the patient's care was started at [facility] PM.   Chief Complaint  Patient presents with  . Cough  . Nasal Congestion  . Headache  . Fever    took tylenol this am   The history is provided by the patient. No language interpreter was used.   HPI Comments: Maureen Price is a 37 y.o. female who presents to the Emergency Department complaining of congestion, rhinorrhea, productive cough of green sputum, sore throat, headache and sinus pressure that started 2 days ago. Pt had a fever of 101.1 this morning. She is also having mild wheezing. Pt took tylneol this morning with no relief. She states she had a flu shot and a pneumonia shot this year. Pt smokes cigarettes daily.   Past Medical History  Diagnosis Date  . Diabetes mellitus     2002; no meds since 2004   Past Surgical History  Procedure Laterality Date  . Cesarean section    . Gallbladder surgery    . Tubal ligation      2000  . Cesarean section  [phone]   Family History  Problem Relation Age of Onset  . Arthritis Mother   . Diabetes Mother   . Heart disease Father   . Stroke Father   . Cancer Neg Hx   . Alcohol abuse Neg Hx   . Early death Neg Hx   . Kidney disease Neg Hx   . Hypertension Neg Hx   . Hyperlipidemia Neg Hx   . Diabetes Maternal Grandfather    History  Substance Use Topics  . Smoking status: Current Every Day Smoker -- 1.00 packs/day for 20 years    Types: Cigarettes  . Smokeless tobacco: Not on file  . Alcohol Use: No   OB History   Grav Para Term Preterm Abortions TAB SAB Ect Mult Living   4 3 3  1  1   3      Review of Systems  Constitutional: Positive for fever.  HENT: Positive for congestion, rhinorrhea, sinus pressure and sore  throat.   Respiratory: Positive for cough and wheezing.   Neurological: Positive for headaches.  All other systems reviewed and are negative.    Allergies  Erythromycin and Penicillins  Home Medications   Current Outpatient Rx  Name  Route  Sig  Dispense  Refill  . aspirin-acetaminophen-caffeine (EXCEDRIN MIGRAINE) 250-250-65 MG per tablet   Oral   Take 2 tablets by mouth every 6 (six) hours as needed for pain.         . Blood Glucose Monitoring Suppl (ONETOUCH VERIO IQ SYSTEM) W/DEVICE KIT   Does not apply   1 Act by Does not apply route 3 (three) times daily.   2 kit   0   . glucose blood (ONETOUCH VERIO) test strip      Use TID   100 each   12   . ibuprofen (ADVIL,MOTRIN) 800 MG tablet   Oral   Take 1 tablet (800 mg total) by mouth 3 (three) times daily with meals.   30 tablet   0   . insulin detemir (LEVEMIR) 100 UNIT/ML injection   Subcutaneous   Inject 0.5 mLs (50 Units total) into the skin daily.   10 mL  11   . SitaGLIPtin-MetFORMIN HCl (JANUMET XR) 50-1000 MG TB24   Oral   Take 2 tablets by mouth daily.   180 tablet   1   . Vilazodone HCl (VIIBRYD) 10 & 20 & 40 MG KIT   Oral   Take 1 tablet by mouth daily.   1 kit   0    BP 125/70  Pulse 94  Temp(Src) 98.3 F (36.8 C) (Oral)  Resp 18  SpO2 99%  LMP 08/30/2013  Physical Exam  Nursing note and vitals reviewed. Constitutional: She is oriented to person, place, and time. She appears well-developed and well-nourished. No distress.  HENT:  Head: Normocephalic and atraumatic.  Right Ear: External ear normal.  Left Ear: External ear normal.  Mouth/Throat: Oropharynx is clear and moist. No oropharyngeal exudate.  Boggy nasal mucosa   Eyes: Conjunctivae are normal. Pupils are equal, round, and reactive to light. No scleral icterus.  Neck: Normal range of motion. Neck supple.  Cardiovascular: Normal rate, regular rhythm and normal heart sounds.  Exam reveals no gallop and no friction rub.    No murmur heard. Pulmonary/Chest: Effort normal. No respiratory distress. She has wheezes. She has no rales. She exhibits no tenderness.  Bilateral diffuse expiratory wheezing  Abdominal: Soft. Bowel sounds are normal. She exhibits no distension. There is no tenderness. There is no rebound and no guarding.  Musculoskeletal: Normal range of motion. She exhibits no edema and no tenderness.  Lymphadenopathy:    She has no cervical adenopathy.  Neurological: She is alert and oriented to person, place, and time. She exhibits normal muscle tone. Coordination normal.  Skin: Skin is warm and dry. No rash noted. No erythema. No pallor.  Psychiatric: She has a normal mood and affect. Her behavior is normal. Judgment and thought content normal.    ED Course  Procedures (including critical care time)  DIAGNOSTIC STUDIES: Oxygen Saturation is 99% on RA, normal by my interpretation.    COORDINATION OF CARE: 6:47 PM-Discussed treatment plan which includes an antibiotic, inhaler and cough medicine with pt at bedside and pt agreed to plan.   Labs Review Labs Reviewed - No data to display Imaging Review Dg Chest 2 View  09/04/2013   CLINICAL DATA:  Cough, chest congestion, fever  EXAM: CHEST  2 VIEW  COMPARISON:  Prior radiograph from 06/26/2013  FINDINGS: The cardiac and mediastinal silhouettes are stable in size and contour, and remain within normal limits.  The lungs are normally inflated. No airspace consolidation, pleural effusion, or pulmonary edema is identified. There is no pneumothorax.  No acute osseous abnormality identified.  IMPRESSION: No active cardiopulmonary disease.   Electronically Signed   By: Rise Mu M.D.   On: 09/04/2013 18:45    EKG Interpretation   None       MDM  Bronchitis  Patient here with upper respiratory infection most c/w bronchitis - will give inhaler here and she will follow up with PCP - also given cough medication.  I personally performed the  services described in this documentation, which was scribed in my presence. The recorded information has been reviewed and is accurate.    Izola Price Marisue Humble, [place] 09/04/13 [phone]

## 2013-09-04 NOTE — ED Notes (Signed)
Progress URI symptoms, headache sinus pressure x 2 days. Moist, productive cough, green mucus

## 2013-09-07 NOTE — ED Provider Notes (Signed)
Medical screening examination/treatment/procedure(s) were performed by non-physician practitioner and as supervising physician I was immediately available for consultation/collaboration.   Dot Lanes, MD 09/07/13 1048

## 2013-09-11 ENCOUNTER — Encounter: Payer: Self-pay | Admitting: Physical Medicine & Rehabilitation

## 2013-09-20 ENCOUNTER — Encounter (INDEPENDENT_AMBULATORY_CARE_PROVIDER_SITE_OTHER): Payer: Self-pay | Admitting: Ophthalmology

## 2013-09-22 ENCOUNTER — Encounter (HOSPITAL_COMMUNITY): Payer: Self-pay | Admitting: Emergency Medicine

## 2013-09-22 ENCOUNTER — Emergency Department (HOSPITAL_COMMUNITY)
Admission: EM | Admit: 2013-09-22 | Discharge: 2013-09-22 | Disposition: A | Discharge status: Home / Self Care | Payer: BC Managed Care – PPO | Attending: Emergency Medicine | Admitting: Emergency Medicine

## 2013-09-22 DIAGNOSIS — Y990 Civilian activity done for income or pay: Secondary | ICD-10-CM | POA: Insufficient documentation

## 2013-09-22 DIAGNOSIS — Y9389 Activity, other specified: Secondary | ICD-10-CM | POA: Insufficient documentation

## 2013-09-22 DIAGNOSIS — F17200 Nicotine dependence, unspecified, uncomplicated: Secondary | ICD-10-CM | POA: Insufficient documentation

## 2013-09-22 DIAGNOSIS — M549 Dorsalgia, unspecified: Secondary | ICD-10-CM

## 2013-09-22 DIAGNOSIS — Z794 Long term (current) use of insulin: Secondary | ICD-10-CM | POA: Insufficient documentation

## 2013-09-22 DIAGNOSIS — IMO0002 Reserved for concepts with insufficient information to code with codable children: Secondary | ICD-10-CM | POA: Insufficient documentation

## 2013-09-22 DIAGNOSIS — Z880 Allergy status to penicillin: Secondary | ICD-10-CM | POA: Insufficient documentation

## 2013-09-22 DIAGNOSIS — E119 Type 2 diabetes mellitus without complications: Secondary | ICD-10-CM | POA: Insufficient documentation

## 2013-09-22 DIAGNOSIS — Z79899 Other long term (current) drug therapy: Secondary | ICD-10-CM | POA: Insufficient documentation

## 2013-09-22 DIAGNOSIS — G8929 Other chronic pain: Secondary | ICD-10-CM | POA: Insufficient documentation

## 2013-09-22 DIAGNOSIS — Y9289 Other specified places as the place of occurrence of the external cause: Secondary | ICD-10-CM | POA: Insufficient documentation

## 2013-09-22 DIAGNOSIS — X503XXA Overexertion from repetitive movements, initial encounter: Secondary | ICD-10-CM | POA: Insufficient documentation

## 2013-09-22 DIAGNOSIS — Z792 Long term (current) use of antibiotics: Secondary | ICD-10-CM | POA: Insufficient documentation

## 2013-09-22 MED ORDER — HYDROCODONE-ACETAMINOPHEN 5-325 MG PO TABS: 1.0000 | ORAL_TABLET | ORAL | Status: DC | PRN

## 2013-09-22 MED ORDER — DIAZEPAM 5 MG PO TABS: 5.0000 mg | ORAL_TABLET | Freq: Three times a day (TID) | ORAL | Status: DC | PRN

## 2013-09-22 MED ORDER — KETOROLAC TROMETHAMINE 30 MG/ML IJ SOLN
15.0000 mg | Freq: Once | INTRAMUSCULAR | Status: AC
  Administered 2013-09-22: 15 mg via INTRAMUSCULAR
  Filled 2013-09-22: qty 1

## 2013-09-22 MED ORDER — HYDROMORPHONE HCL PF 2 MG/ML IJ SOLN
2.0000 mg | Freq: Once | INTRAMUSCULAR | Status: AC
  Administered 2013-09-22: 2 mg via INTRAMUSCULAR
  Filled 2013-09-22: qty 1

## 2013-09-22 MED ORDER — OXYCODONE-ACETAMINOPHEN 5-325 MG PO TABS
2.0000 | ORAL_TABLET | Freq: Once | ORAL | Status: AC
  Administered 2013-09-22: 2 via ORAL
  Filled 2013-09-22: qty 2

## 2013-09-22 MED ORDER — DIAZEPAM 5 MG PO TABS
5.0000 mg | ORAL_TABLET | Freq: Once | ORAL | Status: AC
  Administered 2013-09-22: 5 mg via ORAL
  Filled 2013-09-22: qty 1

## 2013-09-22 NOTE — ED Notes (Signed)
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

## 2013-09-22 NOTE — Discharge Instructions (Signed)
Back Exercises °Back exercises help treat and prevent back injuries. The goal of back exercises is to increase the strength of your abdominal and back muscles and the flexibility of your back. These exercises should be started when you no longer have back pain. Back exercises include: °· Pelvic Tilt. Lie on your back with your knees bent. Tilt your pelvis until the lower part of your back is against the floor. Hold this position 5 to 10 sec and repeat 5 to 10 times. °· Knee to Chest. Pull first 1 knee up against your chest and hold for 20 to 30 seconds, repeat this with the other knee, and then both knees. This may be done with the other leg straight or bent, whichever feels better. °· Sit-Ups or Curl-Ups. Bend your knees 90 degrees. Start with tilting your pelvis, and do a partial, slow sit-up, lifting your trunk only 30 to 45 degrees off the floor. Take at least 2 to 3 seconds for each sit-up. Do not do sit-ups with your knees out straight. If partial sit-ups are difficult, simply do the above but with only tightening your abdominal muscles and holding it as directed. °· Hip-Lift. Lie on your back with your knees flexed 90 degrees. Push down with your feet and shoulders as you raise your hips a couple inches off the floor; hold for 10 seconds, repeat 5 to 10 times. °· Back arches. Lie on your stomach, propping yourself up on bent elbows. Slowly press on your hands, causing an arch in your low back. Repeat 3 to 5 times. Any initial stiffness and discomfort should lessen with repetition over time. °· Shoulder-Lifts. Lie face down with arms beside your body. Keep hips and torso pressed to floor as you slowly lift your head and shoulders off the floor. °Do not overdo your exercises, especially in the beginning. Exercises may cause you some mild back discomfort which lasts for a few minutes; however, if the pain is more severe, or lasts for more than 15 minutes, do not continue exercises until you see your caregiver.  Improvement with exercise therapy for back problems is slow.  °See your caregivers for assistance with developing a proper back exercise program. °Document Released: 10/01/2004 Document Revised: 11/16/2011 Document Reviewed: 06/25/2011 °ExitCare® Patient Information ©2014 [employer]. ° °Back Pain, Adult °Low back pain is very common. About 1 in 5 people have back pain. The cause of low back pain is rarely dangerous. The pain often gets better over time. About half of people with a sudden onset of back pain feel better in just 2 weeks. About 8 in 10 people feel better by 6 weeks.  °CAUSES °Some common causes of back pain include: °· Strain of the muscles or ligaments supporting the spine. °· Wear and tear (degeneration) of the spinal discs. °· Arthritis. °· Direct injury to the back. °DIAGNOSIS °Most of the time, the direct cause of low back pain is not known. However, back pain can be treated effectively even when the exact cause of the pain is unknown. Answering your caregiver's questions about your overall health and symptoms is one of the most accurate ways to make sure the cause of your pain is not dangerous. If your caregiver needs more information, he or she may order lab work or imaging tests (X-rays or MRIs). However, even if imaging tests show changes in your back, this usually does not require surgery. °HOME CARE INSTRUCTIONS °For many people, back pain returns. Since low back pain is rarely dangerous, it is often a condition that people   can learn to manage on their own.  °· Remain active. It is stressful on the back to sit or stand in one place. Do not sit, drive, or stand in one place for more than 30 minutes at a time. Take short walks on level surfaces as soon as pain allows. Try to increase the length of time you walk each day. °· Do not stay in bed. Resting more than 1 or 2 days can delay your recovery. °· Do not avoid exercise or work. Your body is made to move. It is not dangerous to be active,  even though your back may hurt. Your back will likely heal faster if you return to being active before your pain is gone. °· Pay attention to your body when you  bend and lift. Many people have less discomfort when lifting if they bend their knees, keep the load close to their bodies, and avoid twisting. Often, the most comfortable positions are those that put less stress on your recovering back. °· Find a comfortable position to sleep. Use a firm mattress and lie on your side with your knees slightly bent. If you lie on your back, put a pillow under your knees. °· Only take over-the-counter or prescription medicines as directed by your caregiver. Over-the-counter medicines to reduce pain and inflammation are often the most helpful. Your caregiver may prescribe muscle relaxant drugs. These medicines help dull your pain so you can more quickly return to your normal activities and healthy exercise. °· Put ice on the injured area. °· Put ice in a plastic bag. °· Place a towel between your skin and the bag. °· Leave the ice on for 15-20 minutes, 03-04 times a day for the first 2 to 3 days. After that, ice and heat may be alternated to reduce pain and spasms. °· Ask your caregiver about trying back exercises and gentle massage. This may be of some benefit. °· Avoid feeling anxious or stressed. Stress increases muscle tension and can worsen back pain. It is important to recognize when you are anxious or stressed and learn ways to manage it. Exercise is a great option. °SEEK MEDICAL CARE IF: °· You have pain that is not relieved with rest or medicine. °· You have pain that does not improve in 1 week. °· You have new symptoms. °· You are generally not feeling well. °SEEK IMMEDIATE MEDICAL CARE IF:  °· You have pain that radiates from your back into your legs. °· You develop new bowel or bladder control problems. °· You have unusual weakness or numbness in your arms or legs. °· You develop nausea or vomiting. °· You develop  abdominal pain. °· You feel faint. °Document Released: 08/24/2005 Document Revised: 02/23/2012 Document Reviewed: 01/12/2011 °ExitCare® Patient Information ©2014 [employer]. ° °

## 2013-09-22 NOTE — ED Provider Notes (Signed)
CSN: [id number]     Arrival date & time 09/22/13  1957 History   First MD Initiated Contact with Patient 09/22/13 2027     Chief Complaint  Patient presents with  . Back Pain   (Consider location/radiation/quality/duration/timing/severity/associated sxs/prior Treatment) HPI Comments: Patient presents to the emergency department with chief complaint of low back pain. She states that she has chronic back pain, but this back pain recently worsened today. She states that this week they have been short staffed at work, and she has had 2 work more often than normal. She works as a [employer]arry manager. She states that she has many heavy patient, which require lifting, pushing and pulling. She states pain is 10 out of 10. She has not tried anything to alleviate the symptoms. Pain is worsened with movement. She denies any bowel or bladder incontinence. Denies any saddle anesthesia. States that she is able to ambulate, but it is painful.  The history is provided by the patient. No language interpreter was used.    Past Medical History  Diagnosis Date  . Diabetes mellitus     2002; no meds since 2004   Past Surgical History  Procedure Laterality Date  . Cesarean section    . Gallbladder surgery    . Tubal ligation      2000  . Cesarean section  [phone]   Family History  Problem Relation Age of Onset  . Arthritis Mother   . Diabetes Mother   . Heart disease Father   . Stroke Father   . Cancer Neg Hx   . Alcohol abuse Neg Hx   . Early death Neg Hx   . Kidney disease Neg Hx   . Hypertension Neg Hx   . Hyperlipidemia Neg Hx   . Diabetes Maternal Grandfather    History  Substance Use Topics  . Smoking status: Current Every Day Smoker -- 1.00 packs/day for 20 years    Types: Cigarettes  . Smokeless tobacco: Not on file  . Alcohol Use: 0.0 oz/week     Comment: occasional   OB History   Grav Para Term Preterm Abortions TAB SAB Ect Mult Living   4 3 3  1  1   3      Review of Systems  All  other systems reviewed and are negative.    Allergies  Erythromycin; Penicillins; and Viibryd  Home Medications   Current Outpatient Rx  Name  Route  Sig  Dispense  Refill  . aspirin-acetaminophen-caffeine (EXCEDRIN MIGRAINE) 250-250-65 MG per tablet   Oral   Take 2 tablets by mouth every 6 (six) hours as needed for pain.         . chlorpheniramine-HYDROcodone (TUSSIONEX) 10-8 MG/5ML LQCR   Oral   Take 5 mLs by mouth every 12 (twelve) hours as needed for cough.   115 mL   0   . ibuprofen (ADVIL,MOTRIN) 200 MG tablet   Oral   Take 200 mg by mouth every 6 (six) hours as needed for mild pain.         . Ibuprofen-Diphenhydramine Cit (ADVIL PM PO)   Oral   Take by mouth.         . insulin detemir (LEVEMIR) 100 UNIT/ML injection   Subcutaneous   Inject 0.5 mLs (50 Units total) into the skin daily.   10 mL   11   . SitaGLIPtin-MetFORMIN HCl (JANUMET XR) 50-1000 MG TB24   Oral   Take 2 tablets by mouth daily.   180 tablet  1   . levofloxacin (LEVAQUIN) 500 MG tablet   Oral   Take 500 mg by mouth daily.          BP 132/78  Pulse 95  Temp(Src) 97.8 F (36.6 C) (Oral)  Resp 16  Wt 238 lb (107.956 kg)  SpO2 100%  LMP 09/22/2013 Physical Exam  Nursing note and vitals reviewed. Constitutional: She is oriented to person, place, and time. She appears well-developed and well-nourished. No distress.  HENT:  Head: Normocephalic and atraumatic.  Eyes: Conjunctivae and EOM are normal. Right eye exhibits no discharge. Left eye exhibits no discharge. No scleral icterus.  Neck: Normal range of motion. Neck supple. No tracheal deviation present.  Cardiovascular: Normal rate, regular rhythm and normal heart sounds.  Exam reveals no gallop and no friction rub.   No murmur heard. Pulmonary/Chest: Effort normal and breath sounds normal. No respiratory distress. She has no wheezes.  Abdominal: Soft. She exhibits no distension. There is no tenderness.  Musculoskeletal:  Normal range of motion.  Lumbar paraspinal muscles tender to palpation, no bony tenderness, step-offs, or gross abnormality or deformity of spine, patient is able to ambulate, moves all extremities  Bilateral great toe extension intact Bilateral plantar/dorsiflexion intact  Neurological: She is alert and oriented to person, place, and time. She has normal reflexes.  Sensation and strength intact bilaterally Symmetrical reflexes  Skin: Skin is warm. She is not diaphoretic.  Psychiatric: She has a normal mood and affect. Her behavior is normal. Judgment and thought content normal.    ED Course  Procedures (including critical care time) Labs Review Labs Reviewed - No data to display Imaging Review No results found.  EKG Interpretation   None       MDM   1. Back pain     Patient has severe back pain, without concerning symptoms, will treat pain in the ED, and will reevaluate.  10:57 PM Pain improved with treatment in the ED.  Patient with back pain.  No neurological deficits and normal neuro exam.  Patient can walk but states is painful.  No loss of bowel or bladder control.  No concern for cauda equina.  No fever, night sweats, weight loss, h/o cancer, IVDU.  RICE protocol and pain medicine indicated and discussed with patient.      Montine Circle, PA-C 09/22/13 2257

## 2013-09-22 NOTE — ED Provider Notes (Signed)
Medical screening examination/treatment/procedure(s) were performed by non-physician practitioner and as supervising physician I was immediately available for consultation/collaboration.  EKG Interpretation   None         Saddie Benders. Alona Danford, MD 09/22/13 2351

## 2013-10-20 ENCOUNTER — Encounter: Payer: BC Managed Care – PPO | Attending: Physical Medicine & Rehabilitation

## 2013-10-20 ENCOUNTER — Ambulatory Visit: Payer: BC Managed Care – PPO | Admitting: Physical Medicine & Rehabilitation

## 2013-10-24 ENCOUNTER — Emergency Department (HOSPITAL_COMMUNITY): Payer: BC Managed Care – PPO

## 2013-10-24 ENCOUNTER — Encounter (HOSPITAL_COMMUNITY): Payer: Self-pay | Admitting: Emergency Medicine

## 2013-10-24 ENCOUNTER — Ambulatory Visit (HOSPITAL_COMMUNITY): Admit: 2013-10-24 | Payer: Self-pay | Admitting: Cardiovascular Disease

## 2013-10-24 ENCOUNTER — Inpatient Hospital Stay (HOSPITAL_COMMUNITY)
Admission: EM | Admit: 2013-10-24 | Discharge: 2013-10-27 | DRG: 247 | Disposition: A | Discharge status: Home / Self Care | Payer: BC Managed Care – PPO | Attending: Cardiovascular Disease | Admitting: Cardiovascular Disease

## 2013-10-24 ENCOUNTER — Encounter (HOSPITAL_COMMUNITY)
Admission: EM | Disposition: A | Discharge status: Home / Self Care | Payer: Self-pay | Source: Home / Self Care | Attending: Cardiovascular Disease

## 2013-10-24 DIAGNOSIS — I472 Ventricular tachycardia, unspecified: Secondary | ICD-10-CM | POA: Diagnosis not present

## 2013-10-24 DIAGNOSIS — F17200 Nicotine dependence, unspecified, uncomplicated: Secondary | ICD-10-CM | POA: Diagnosis present

## 2013-10-24 DIAGNOSIS — I4729 Other ventricular tachycardia: Secondary | ICD-10-CM | POA: Diagnosis not present

## 2013-10-24 DIAGNOSIS — Z955 Presence of coronary angioplasty implant and graft: Secondary | ICD-10-CM

## 2013-10-24 DIAGNOSIS — Z841 Family history of disorders of kidney and ureter: Secondary | ICD-10-CM

## 2013-10-24 DIAGNOSIS — Z833 Family history of diabetes mellitus: Secondary | ICD-10-CM

## 2013-10-24 DIAGNOSIS — IMO0001 Reserved for inherently not codable concepts without codable children: Secondary | ICD-10-CM

## 2013-10-24 DIAGNOSIS — I2109 ST elevation (STEMI) myocardial infarction involving other coronary artery of anterior wall: Principal | ICD-10-CM | POA: Diagnosis present

## 2013-10-24 DIAGNOSIS — Z8249 Family history of ischemic heart disease and other diseases of the circulatory system: Secondary | ICD-10-CM

## 2013-10-24 DIAGNOSIS — I219 Acute myocardial infarction, unspecified: Secondary | ICD-10-CM

## 2013-10-24 DIAGNOSIS — Z823 Family history of stroke: Secondary | ICD-10-CM

## 2013-10-24 DIAGNOSIS — I2510 Atherosclerotic heart disease of native coronary artery without angina pectoris: Secondary | ICD-10-CM | POA: Diagnosis present

## 2013-10-24 DIAGNOSIS — Z79899 Other long term (current) drug therapy: Secondary | ICD-10-CM

## 2013-10-24 DIAGNOSIS — I213 ST elevation (STEMI) myocardial infarction of unspecified site: Secondary | ICD-10-CM | POA: Diagnosis present

## 2013-10-24 DIAGNOSIS — E876 Hypokalemia: Secondary | ICD-10-CM | POA: Diagnosis not present

## 2013-10-24 DIAGNOSIS — Z9851 Tubal ligation status: Secondary | ICD-10-CM

## 2013-10-24 DIAGNOSIS — E669 Obesity, unspecified: Secondary | ICD-10-CM | POA: Diagnosis present

## 2013-10-24 DIAGNOSIS — Z794 Long term (current) use of insulin: Secondary | ICD-10-CM

## 2013-10-24 DIAGNOSIS — R209 Unspecified disturbances of skin sensation: Secondary | ICD-10-CM | POA: Diagnosis not present

## 2013-10-24 DIAGNOSIS — Z6841 Body Mass Index (BMI) 40.0 and over, adult: Secondary | ICD-10-CM

## 2013-10-24 DIAGNOSIS — Z8261 Family history of arthritis: Secondary | ICD-10-CM

## 2013-10-24 DIAGNOSIS — I959 Hypotension, unspecified: Secondary | ICD-10-CM | POA: Diagnosis not present

## 2013-10-24 DIAGNOSIS — Z880 Allergy status to penicillin: Secondary | ICD-10-CM

## 2013-10-24 DIAGNOSIS — E1165 Type 2 diabetes mellitus with hyperglycemia: Secondary | ICD-10-CM

## 2013-10-24 DIAGNOSIS — D649 Anemia, unspecified: Secondary | ICD-10-CM | POA: Diagnosis present

## 2013-10-24 DIAGNOSIS — Z7982 Long term (current) use of aspirin: Secondary | ICD-10-CM

## 2013-10-24 DIAGNOSIS — Z7902 Long term (current) use of antithrombotics/antiplatelets: Secondary | ICD-10-CM

## 2013-10-24 DIAGNOSIS — I10 Essential (primary) hypertension: Secondary | ICD-10-CM | POA: Diagnosis present

## 2013-10-24 DIAGNOSIS — Z881 Allergy status to other antibiotic agents status: Secondary | ICD-10-CM

## 2013-10-24 DIAGNOSIS — E785 Hyperlipidemia, unspecified: Secondary | ICD-10-CM | POA: Diagnosis present

## 2013-10-24 DIAGNOSIS — IMO0002 Reserved for concepts with insufficient information to code with codable children: Secondary | ICD-10-CM | POA: Diagnosis present

## 2013-10-24 DIAGNOSIS — M1710 Unilateral primary osteoarthritis, unspecified knee: Secondary | ICD-10-CM | POA: Diagnosis present

## 2013-10-24 DIAGNOSIS — I70209 Unspecified atherosclerosis of native arteries of extremities, unspecified extremity: Secondary | ICD-10-CM

## 2013-10-24 DIAGNOSIS — I749 Embolism and thrombosis of unspecified artery: Secondary | ICD-10-CM | POA: Diagnosis present

## 2013-10-24 DIAGNOSIS — E1151 Type 2 diabetes mellitus with diabetic peripheral angiopathy without gangrene: Secondary | ICD-10-CM | POA: Diagnosis present

## 2013-10-24 DIAGNOSIS — Z888 Allergy status to other drugs, medicaments and biological substances status: Secondary | ICD-10-CM

## 2013-10-24 DIAGNOSIS — Z6379 Other stressful life events affecting family and household: Secondary | ICD-10-CM

## 2013-10-24 HISTORY — DX: Ventricular tachycardia, unspecified: I47.20

## 2013-10-24 HISTORY — DX: Hyperlipidemia, unspecified: E78.5

## 2013-10-24 HISTORY — DX: Other ventricular tachycardia: I47.29

## 2013-10-24 HISTORY — DX: ST elevation (STEMI) myocardial infarction involving other coronary artery of anterior wall: I21.09

## 2013-10-24 HISTORY — DX: Obesity, unspecified: E66.9

## 2013-10-24 HISTORY — DX: ST elevation (STEMI) myocardial infarction of unspecified site: I21.3

## 2013-10-24 HISTORY — DX: Atherosclerotic heart disease of native coronary artery without angina pectoris: I25.10

## 2013-10-24 HISTORY — PX: LEFT HEART CATH: SHX5478

## 2013-10-24 HISTORY — DX: Ventricular tachycardia: I47.2

## 2013-10-24 LAB — COMPREHENSIVE METABOLIC PANEL
ALK PHOS: 120 U/L — AB (ref 39–117)
ALT: 18 U/L (ref 0–35)
AST: 15 U/L (ref 0–37)
Albumin: 3.5 g/dL (ref 3.5–5.2)
BILIRUBIN TOTAL: 0.3 mg/dL (ref 0.3–1.2)
BUN: 6 mg/dL (ref 6–23)
CO2: 20 meq/L (ref 19–32)
Calcium: 8.9 mg/dL (ref 8.4–10.5)
Chloride: 99 mEq/L (ref 96–112)
Creatinine, Ser: 0.42 mg/dL — ABNORMAL LOW (ref 0.50–1.10)
GFR calc Af Amer: >90 mL/min (ref >90)
Glucose, Bld: 292 mg/dL — ABNORMAL HIGH (ref 70–99)
POTASSIUM: 3.5 meq/L — AB (ref 3.7–5.3)
Sodium: 138 mEq/L (ref 137–147)
Total Protein: 7.2 g/dL (ref 6.0–8.3)

## 2013-10-24 LAB — POCT ACTIVATED CLOTTING TIME
ACTIVATED CLOTTING TIME: 398 s
ACTIVATED CLOTTING TIME: 121 s
Activated Clotting Time: 204 seconds

## 2013-10-24 LAB — MRSA PCR SCREENING: MRSA BY PCR: NEGATIVE

## 2013-10-24 LAB — MAGNESIUM: Magnesium: 1.5 mg/dL (ref 1.5–2.5)

## 2013-10-24 LAB — POCT I-STAT, CHEM 8
BUN: 3 mg/dL — ABNORMAL LOW (ref 6–23)
CALCIUM ION: 1.03 mmol/L — AB (ref 1.12–1.23)
CHLORIDE: 100 meq/L (ref 96–112)
Creatinine, Ser: 0.4 mg/dL — ABNORMAL LOW (ref 0.50–1.10)
GLUCOSE: 300 mg/dL — AB (ref 70–99)
HCT: 45 % (ref 36.0–46.0)
Hemoglobin: 15.3 g/dL — ABNORMAL HIGH (ref 12.0–15.0)
POTASSIUM: 3.6 meq/L — AB (ref 3.7–5.3)
Sodium: 138 mEq/L (ref 137–147)
TCO2: 24 mmol/L (ref 0–100)

## 2013-10-24 LAB — PROTIME-INR
INR: 1.03 (ref 0.00–1.49)
Prothrombin Time: 13.3 seconds (ref 11.6–15.2)

## 2013-10-24 LAB — CK TOTAL AND CKMB (NOT AT ARMC)
CK, MB: 107.5 ng/mL (ref 0.3–4.0)
Relative Index: 9.2 — ABNORMAL HIGH (ref 0.0–2.5)
Total CK: 1166 U/L — ABNORMAL HIGH (ref 7–177)

## 2013-10-24 LAB — POCT I-STAT TROPONIN I: Troponin i, poc: 0.21 ng/mL (ref 0.00–0.08)

## 2013-10-24 LAB — POCT I-STAT 7, (LYTES, BLD GAS, ICA,H+H)
ACID-BASE EXCESS: 1 mmol/L (ref 0.0–2.0)
BICARBONATE: 25.2 meq/L — AB (ref 20.0–24.0)
CALCIUM ION: 1.18 mmol/L (ref 1.12–1.23)
HCT: 36 % (ref 36.0–46.0)
Hemoglobin: 12.2 g/dL (ref 12.0–15.0)
O2 SAT: 96 %
Potassium: 3.1 mEq/L — ABNORMAL LOW (ref 3.7–5.3)
Sodium: 140 mEq/L (ref 137–147)
TCO2: 26 mmol/L (ref 0–100)
pCO2 arterial: 39 mmHg (ref 35.0–45.0)
pH, Arterial: 7.417 (ref 7.350–7.450)
pO2, Arterial: 82 mmHg (ref 80.0–100.0)

## 2013-10-24 LAB — GLUCOSE, CAPILLARY
GLUCOSE-CAPILLARY: 224 mg/dL — AB (ref 70–99)
GLUCOSE-CAPILLARY: 161 mg/dL — AB (ref 70–99)
Glucose-Capillary: 295 mg/dL — ABNORMAL HIGH (ref 70–99)
Glucose-Capillary: 206 mg/dL — ABNORMAL HIGH (ref 70–99)

## 2013-10-24 LAB — HEMOGLOBIN A1C
Hgb A1c MFr Bld: 10.9 % — ABNORMAL HIGH (ref <5.7)
Mean Plasma Glucose: 266 mg/dL — ABNORMAL HIGH (ref <117)

## 2013-10-24 LAB — CG4 I-STAT (LACTIC ACID): Lactic Acid, Venous: 2.49 mmol/L — ABNORMAL HIGH (ref 0.5–2.2)

## 2013-10-24 LAB — APTT: aPTT: 59 seconds — ABNORMAL HIGH (ref 24–37)

## 2013-10-24 LAB — CBC
HCT: 34.3 % — ABNORMAL LOW (ref 36.0–46.0)
HEMATOCRIT: 39.6 % (ref 36.0–46.0)
Hemoglobin: 14.4 g/dL (ref 12.0–15.0)
Hemoglobin: 12.3 g/dL (ref 12.0–15.0)
MCH: 28.2 pg (ref 26.0–34.0)
MCH: 27.8 pg (ref 26.0–34.0)
MCHC: 36.4 g/dL — ABNORMAL HIGH (ref 30.0–36.0)
MCHC: 35.9 g/dL (ref 30.0–36.0)
MCV: 77.6 fL — AB (ref 78.0–100.0)
MCV: 77.6 fL — ABNORMAL LOW (ref 78.0–100.0)
PLATELETS: 329 10*3/uL (ref 150–400)
PLATELETS: 280 10*3/uL (ref 150–400)
RBC: 5.1 MIL/uL (ref 3.87–5.11)
RBC: 4.42 MIL/uL (ref 3.87–5.11)
RDW: 13.9 % (ref 11.5–15.5)
RDW: 14 % (ref 11.5–15.5)
WBC: 14.5 10*3/uL — AB (ref 4.0–10.5)
WBC: 9.6 10*3/uL (ref 4.0–10.5)

## 2013-10-24 LAB — TROPONIN I
TROPONIN I: 4.75 ng/mL — AB (ref <0.30)
TROPONIN I: 17.05 ng/mL — AB (ref <0.30)
Troponin I: >20 ng/mL (ref <0.30)

## 2013-10-24 LAB — PRO B NATRIURETIC PEPTIDE: Pro B Natriuretic peptide (BNP): 25.6 pg/mL (ref 0–125)

## 2013-10-24 SURGERY — LEFT HEART CATH
Anesthesia: LOCAL

## 2013-10-24 MED ORDER — SODIUM CHLORIDE 0.9 % IV SOLN
0.2500 mg/kg/h | INTRAVENOUS | Status: AC
  Administered 2013-10-24: 0.25 mg/kg/h via INTRAVENOUS
  Filled 2013-10-24: qty 250

## 2013-10-24 MED ORDER — ASPIRIN 81 MG PO CHEW
81.0000 mg | CHEWABLE_TABLET | Freq: Every day | ORAL | Status: DC
  Administered 2013-10-24 – 2013-10-25 (×2): 81 mg via ORAL
  Filled 2013-10-24 (×2): qty 1

## 2013-10-24 MED ORDER — ATORVASTATIN CALCIUM 80 MG PO TABS
80.0000 mg | ORAL_TABLET | Freq: Every day | ORAL | Status: DC
  Administered 2013-10-24 – 2013-10-26 (×3): 80 mg via ORAL
  Filled 2013-10-24 (×4): qty 1

## 2013-10-24 MED ORDER — MORPHINE SULFATE 4 MG/ML IJ SOLN
4.0000 mg | Freq: Once | INTRAMUSCULAR | Status: AC
Start: 2013-10-24 — End: 2013-10-24
  Administered 2013-10-24: 4 mg via INTRAVENOUS

## 2013-10-24 MED ORDER — ASPIRIN EC 81 MG PO TBEC
81.0000 mg | DELAYED_RELEASE_TABLET | Freq: Every day | ORAL | Status: DC
  Administered 2013-10-26 – 2013-10-27 (×2): 81 mg via ORAL
  Filled 2013-10-24 (×3): qty 1

## 2013-10-24 MED ORDER — BIVALIRUDIN 250 MG IV SOLR
INTRAVENOUS | Status: AC
  Filled 2013-10-24: qty 250

## 2013-10-24 MED ORDER — POTASSIUM CHLORIDE CRYS ER 20 MEQ PO TBCR
40.0000 meq | EXTENDED_RELEASE_TABLET | Freq: Once | ORAL | Status: AC
  Administered 2013-10-24: 40 meq via ORAL
  Filled 2013-10-24: qty 2

## 2013-10-24 MED ORDER — LIDOCAINE HCL (PF) 1 % IJ SOLN
INTRAMUSCULAR | Status: AC
  Filled 2013-10-24: qty 30

## 2013-10-24 MED ORDER — TICAGRELOR 90 MG PO TABS
90.0000 mg | ORAL_TABLET | Freq: Two times a day (BID) | ORAL | Status: DC
  Administered 2013-10-24 – 2013-10-27 (×6): 90 mg via ORAL
  Filled 2013-10-24 (×7): qty 1

## 2013-10-24 MED ORDER — MIDAZOLAM HCL 2 MG/2ML IJ SOLN
INTRAMUSCULAR | Status: AC
  Filled 2013-10-24: qty 2

## 2013-10-24 MED ORDER — HEPARIN (PORCINE) IN NACL 2-0.9 UNIT/ML-% IJ SOLN
INTRAMUSCULAR | Status: AC
  Filled 2013-10-24: qty 1000

## 2013-10-24 MED ORDER — TICAGRELOR 90 MG PO TABS
ORAL_TABLET | ORAL | Status: AC
  Filled 2013-10-24: qty 1

## 2013-10-24 MED ORDER — INSULIN ASPART 100 UNIT/ML ~~LOC~~ SOLN
0.0000 [IU] | Freq: Three times a day (TID) | SUBCUTANEOUS | Status: DC
  Administered 2013-10-24: 3 [IU] via SUBCUTANEOUS
  Administered 2013-10-24: 5 [IU] via SUBCUTANEOUS

## 2013-10-24 MED ORDER — INSULIN DETEMIR 100 UNIT/ML ~~LOC~~ SOLN
50.0000 [IU] | Freq: Every day | SUBCUTANEOUS | Status: DC
  Administered 2013-10-24 – 2013-10-27 (×4): 50 [IU] via SUBCUTANEOUS
  Filled 2013-10-24 (×4): qty 0.5

## 2013-10-24 MED ORDER — INSULIN ASPART 100 UNIT/ML ~~LOC~~ SOLN
0.0000 [IU] | Freq: Every day | SUBCUTANEOUS | Status: DC
Start: 2013-10-24 — End: 2013-10-27
  Administered 2013-10-24 – 2013-10-25 (×2): 2 [IU] via SUBCUTANEOUS

## 2013-10-24 MED ORDER — NITROGLYCERIN 0.4 MG SL SUBL: 0.4000 mg | SUBLINGUAL_TABLET | SUBLINGUAL | Status: DC | PRN

## 2013-10-24 MED ORDER — INSULIN ASPART 100 UNIT/ML ~~LOC~~ SOLN
0.0000 [IU] | Freq: Three times a day (TID) | SUBCUTANEOUS | Status: DC
  Administered 2013-10-24: 3 [IU] via SUBCUTANEOUS
  Administered 2013-10-25: 2 [IU] via SUBCUTANEOUS
  Administered 2013-10-25: 3 [IU] via SUBCUTANEOUS
  Administered 2013-10-26: 5 [IU] via SUBCUTANEOUS
  Administered 2013-10-27 (×2): 3 [IU] via SUBCUTANEOUS

## 2013-10-24 MED ORDER — INSULIN ASPART 100 UNIT/ML ~~LOC~~ SOLN: 0.0000 [IU] | Freq: Three times a day (TID) | SUBCUTANEOUS | Status: DC

## 2013-10-24 MED ORDER — TIROFIBAN HCL IV 5 MG/100ML
INTRAVENOUS | Status: AC
  Filled 2013-10-24: qty 100

## 2013-10-24 MED ORDER — HEPARIN SODIUM (PORCINE) 5000 UNIT/ML IJ SOLN
4000.0000 [IU] | Freq: Once | INTRAMUSCULAR | Status: AC
  Administered 2013-10-24: 4000 [IU] via INTRAVENOUS

## 2013-10-24 MED ORDER — HYDROCODONE-ACETAMINOPHEN 5-325 MG PO TABS
1.0000 | ORAL_TABLET | ORAL | Status: DC | PRN
  Administered 2013-10-24: 2 via ORAL
  Administered 2013-10-24: 1 via ORAL
  Administered 2013-10-25 – 2013-10-27 (×7): 2 via ORAL
  Filled 2013-10-24 (×6): qty 2
  Filled 2013-10-24: qty 1
  Filled 2013-10-24 (×2): qty 2

## 2013-10-24 MED ORDER — ONDANSETRON HCL 4 MG/2ML IJ SOLN
4.0000 mg | Freq: Once | INTRAMUSCULAR | Status: AC
  Administered 2013-10-24: 4 mg via INTRAVENOUS

## 2013-10-24 MED ORDER — METOPROLOL TARTRATE 12.5 MG HALF TABLET
12.5000 mg | ORAL_TABLET | Freq: Two times a day (BID) | ORAL | Status: DC
  Administered 2013-10-24 – 2013-10-27 (×6): 12.5 mg via ORAL
  Filled 2013-10-24 (×10): qty 1

## 2013-10-24 MED ORDER — TICAGRELOR 90 MG PO TABS
90.0000 mg | ORAL_TABLET | Freq: Two times a day (BID) | ORAL | Status: DC
  Administered 2013-10-24: 90 mg via ORAL
  Filled 2013-10-24 (×4): qty 1

## 2013-10-24 MED ORDER — TIROFIBAN HCL IV 12.5 MG/250 ML
0.1500 ug/kg/min | INTRAVENOUS | Status: AC
  Administered 2013-10-24: 0.15 ug/kg/min via INTRAVENOUS
  Filled 2013-10-24 (×4): qty 250

## 2013-10-24 MED ORDER — NITROGLYCERIN 0.2 MG/ML ON CALL CATH LAB
INTRAVENOUS | Status: AC
  Filled 2013-10-24: qty 1

## 2013-10-24 MED ORDER — LEVOFLOXACIN 500 MG PO TABS
500.0000 mg | ORAL_TABLET | Freq: Every day | ORAL | Status: DC
  Administered 2013-10-24 – 2013-10-27 (×4): 500 mg via ORAL
  Filled 2013-10-24 (×4): qty 1

## 2013-10-24 MED ORDER — FENTANYL CITRATE 0.05 MG/ML IJ SOLN
INTRAMUSCULAR | Status: AC
  Filled 2013-10-24: qty 2

## 2013-10-24 MED ORDER — MORPHINE SULFATE 2 MG/ML IJ SOLN
2.0000 mg | INTRAMUSCULAR | Status: DC | PRN
  Administered 2013-10-24: 2 mg via INTRAVENOUS
  Filled 2013-10-24: qty 1

## 2013-10-24 NOTE — Progress Notes (Signed)
Cath lab called and notified of ACT 121. Cath lab said okay and will be over to pull sheath shortly. Will continue to monitor. Sheath site Clean, dry and intact, distal pulse +2, no pain per pt, VS WNL.

## 2013-10-24 NOTE — ED Notes (Signed)
4 mg Zofran given at 0555. 4 mg morphine given 0502

## 2013-10-24 NOTE — ED Notes (Addendum)
Monitor in room is malfunctioning, cardiology, MD and EDP aware. Unable to obtain full set of vitals. Manual blood pressures are as follows: Left arm- 110/70, Right arm 86/50. Pt placed on Zoll.

## 2013-10-24 NOTE — ED Notes (Signed)
Pt transported to cath lab by two RNs and Colon Flattery, MD.

## 2013-10-24 NOTE — CV Procedure (Signed)
Maureen Price is a 38 y.o. female    [id number] LOCATION:  FACILITY: [facility]  PHYSICIAN: Quay Burow, M.D. 01-08-1976   DATE OF PROCEDURE:  10/24/2013  DATE OF DISCHARGE:     CARDIAC CATHETERIZATION     History obtained from chart review. Maureen Price is a 38 year old moderately overweight African American female who presented with anterior ST segment elevation MI after being awakened in the 4:30 this morning with chest pain. She has a history of diabetes poorly controlled as well as family history of heart disease. She was brought emergently to the cath lab for angiography and intervention.   PROCEDURE DESCRIPTION:   The patient was brought to the second floor [facility] cath lab in the postabsorptive state. She was premedicated with IV Versed and fentanyl. Her right groinwas prepped and shaved in usual sterile fashion. Xylocaine 1% was used for local anesthesia. A 6 French sheath was inserted into the right common femoral artery using standard Seldinger technique. 6 French right and left Judkins diagnostic catheters along with a 6 French pigtail catheter were used for selective coronary angiography, and left ventriculography. Visipaque dye was used for the entirety of the case (196 cc of contrast administered the patient's). Retrograde aortic, left ventricular and pullback pressures were recorded.   HEMODYNAMICS:    AO SYSTOLIC/AO DIASTOLIC: 63/78   LV SYSTOLIC/LV DIASTOLIC: 58/85  ANGIOGRAPHIC RESULTS:   1. Left main; normal  2. LAD; occluded after the first diagonal branch 3. Left circumflex; normal.  4. Right coronary artery; dominant with a filling defect in the proximal portion 5. Left ventriculography; RAO left ventriculogram was performed using  25 mL of Visipaque dye at 12 mL/second. The overall LVEF estimated  45 %  With wall motion abnormalities moderate anteroapical hypokinesia  IMPRESSION:large anterior myocardial infarction with an occluded  proximal LAD and a filling defect in the proximal RCA. Will proceed with aspiration thrombectomy, PCI and stenting using drug-eluting stent, Angiomax, and Aggrastat.  Procedure description: The patient received Angiomax bolus and ACT of 398. The patient received aspirin in the emergency room and was given 180 mg of by mouth Brilenta prior to intervention . Using a 6 [place] XB LAD 3.5 cm guide catheter along with an 014 pro-water guidewire and a 2 mm x 12 mm long angioplasty balloon antegrade flow was established. He was asked excessive amount of visible thrombus and therefore multiple passes using 2 different aspiration catheters was performed. The majority of the thrombus burden was aspirated with TIMI-3 flow. There was an extensive amount of "white thrombus removed. Following this the stenting was performed with a 4 mm x 23 mm long Xience  Alpine drug-eluting stent. This was deployed at 18 atmospheres. Postdilatation was performed with a 4.5 mm x 10 20 mm long noncompliant balloon at 16 atmospheres (4.5 mm) resulting in reduction of a total occlusion to 0% residual with TIMI-3 flow. There was a large wraparound LAD. The patient's pain resolved and she was hemodynamically stable with blood pressures in the 90s.  Overall impression: Successful direct angioplasty of an occluded proximal LAD in the setting of an anterior STEMI with a drug-eluting stent and a door to balloon time of 23 minutes. Because of the large thrombus burden in the LAD as well as visible thrombus in the proximal RCA the patient will be treated with Aggrastat for 18 hours. Angiomax will be continued at low dose for 4 hours after which the sheath will be removed. The patient will be treated with aspirin,  Brilenta, beta blocker and statin drug. Cardiac risk factor modification will be stressed including smoking cessation and tighter diabetes control  Maureen Price. MD, [facility] 10/24/2013 6:24 AM

## 2013-10-24 NOTE — Progress Notes (Signed)
Utilization Review Completed.  

## 2013-10-24 NOTE — Progress Notes (Signed)
SUBJECTIVE:  CP earlier has resolved. Wants to eat.  No sx during 8 beat run of VTach.  OBJECTIVE:   Vitals:   Filed Vitals:   10/24/13 1200 10/24/13 1300 10/24/13 1400 10/24/13 1500  BP: 115/69 119/72 100/62   Pulse: 88 87 82 83  Temp:      TempSrc:      Resp: 16 20 13 17   Height:      Weight:      SpO2: 98% 99% 100% 98%   I&O's:   Intake/Output Summary (Last 24 hours) at 10/24/13 1626 Last data filed at 10/24/13 1400  Gross per 24 hour  Intake 330.71 ml  Output   1100 ml  Net -769.29 ml   TELEMETRY: Reviewed telemetry pt in NSR, did have some NSVT     PHYSICAL EXAM General: Well developed, well nourished, in no acute distress Head: Eyes PERRLA, No xanthomas.   Normal cephalic and atramatic  Lungs:  No wheezing Heart:   HRRR S1 S2  No JVD.   Abdomen: obese Msk:  Back normal, normal gait. Normal strength and tone for age. Extremities:  No edema.  DP +2 on right; no right groin hematoma Neuro: Alert and oriented X 3. Psych:  Good affect, responds appropriately   LABS: Basic Metabolic Panel:  Recent Labs  10/24/13 0503 10/24/13 0509 10/24/13 0700 10/24/13 1238  NA 138 138  --  140  K 3.5* 3.6*  --  3.1*  CL 99 100  --   --   CO2 20  --   --   --   GLUCOSE 292* 300*  --   --   BUN 6 3*  --   --   CREATININE 0.42* 0.40*  --   --   CALCIUM 8.9  --   --   --   MG  --   --  1.5  --    Liver Function Tests:  Recent Labs  10/24/13 0503  AST 15  ALT 18  ALKPHOS 120*  BILITOT 0.3  PROT 7.2  ALBUMIN 3.5   No results found for this basename: LIPASE, AMYLASE,  in the last 72 hours CBC:  Recent Labs  10/24/13 0503  10/24/13 1038 10/24/13 1238  WBC 14.5*  --  9.6  --   HGB 14.4  < > 12.3 12.2  HCT 39.6  < > 34.3* 36.0  MCV 77.6*  --  77.6*  --   PLT 329  --  280  --   < > = values in this interval not displayed. Cardiac Enzymes:  Recent Labs  10/24/13 0622 10/24/13 1038  TROPONINI 4.75* >20.00*   BNP: No components found with this  basename: POCBNP,  D-Dimer: No results found for this basename: DDIMER,  in the last 72 hours Hemoglobin A1C:  Recent Labs  10/24/13 0700  HGBA1C 10.9*   Fasting Lipid Panel: No results found for this basename: CHOL, HDL, LDLCALC, TRIG, CHOLHDL, LDLDIRECT,  in the last 72 hours Thyroid Function Tests: No results found for this basename: TSH, T4TOTAL, FREET3, T3FREE, THYROIDAB,  in the last 72 hours Anemia Panel: No results found for this basename: VITAMINB12, FOLATE, FERRITIN, TIBC, IRON, RETICCTPCT,  in the last 72 hours Coag Panel:   Lab Results  Component Value Date   INR 1.03 10/24/2013   INR 1.02 11/22/2012   INR 1.06 05/18/2012    RADIOLOGY: Dg Chest Portable 1 View  10/24/2013   CLINICAL DATA:  Chest pain.  EXAM: PORTABLE CHEST -  1 VIEW  COMPARISON:  09/04/2013  FINDINGS: Low lung volumes. No confluent opacities or effusions. Heart is normal size. No acute bony abnormality.  IMPRESSION: No acute cardiopulmonary disease.   Electronically Signed   By: Rolm Baptise M.D.   On: 10/24/2013 05:10      ASSESSMENT/PLAN:   Doing well post MI.  Watch in ICU given Nonsustained ventricular tachycardia. No sx. Likely related to recent MI.  Check CK MB. Check labs in AM. Dual antiplatelet therapy.  Would not add antiarrhytmic.  Titrate beta blocker as tolerated.  No clear signs of heart failure although EF is slightly decreased.  Needs RF modification.  DM control.  Hyperglycemia-will change to moderate scale for sliding scale.  Weight loss long term.    GP IIb/IIIa inhibitor for filling defect in RCA, overall large thrombus burden.  Higher risk for distal embolization.  Groin stable, continue tirofiban.  Check Hbg and platelets.   Critical care time 30 minutes  Daiden Coltrane S., MD  10/24/2013  4:26 PM

## 2013-10-24 NOTE — ED Notes (Signed)
Pt was woken up at home by severe chest pain. Pt states that she threw up twice at home. Pt received one NTG, with no relief. Received 324 mg Aspirin. Has had similar pain, but no cardiac history.

## 2013-10-24 NOTE — Progress Notes (Signed)
CRITICAL VALUE ALERT  Critical value received:  CKMB  107.5  Date of notification:  10-24-13  Time of notification:  2050  Critical value read back: yes  Nurse who received alert:  Dustin Folks RN  MD notified (1st page):  MD Claiborne Billings  Time of first page: 2055  MD notified (2nd page):  Time of second page:  Responding MD:  MD Claiborne Billings  Time MD responded:  2100

## 2013-10-24 NOTE — Progress Notes (Signed)
Lab called to collect 1630 labs. 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

## 2013-10-24 NOTE — Progress Notes (Signed)
Cath Lab called about sheath removal and ACT of 204. Ordered to recheck ACT at 1230 and to call back in order to reassess removal. Pt currently resting, vital signs WNL, minimal to no pain at sheath insertion site.

## 2013-10-24 NOTE — Progress Notes (Signed)
CRITICAL VALUE ALERT  Critical value received:  Troponin 4.75  Date of notification:  10/24/13  Time of notification:  0750  Critical value read back:yes  Nurse who received alert:  Clerance Lav, RN  MD notified (1st page):  Irish Lack  Time of first page:  0830  MD notified (2nd page):0830  Time of second page:  Responding MD:  Irish Lack  Time MD responded:  3817

## 2013-10-24 NOTE — ED Notes (Signed)
4000 Units heparin IN.

## 2013-10-24 NOTE — H&P (Signed)
[facility]  Cardiology History and Physical  [name], MD  History of Present Illness (and review of medical records): Maureen Price is a 38 y.o. female who presents for evaluation of chest pain.  She has hx of DM uncontrolled, but denies known CAD or prior MI.  She woke up with chest pain around 430.  Pain was severe, felt like bricks on her chest.  She also had N/V x 2.  She called EMS and was brought to ED.  EMS sent ecg concerning for STEMI.  She was given ASA and NTG by EMS.  Still having 6/10 pain.  IV heparin bolus given.  Previous diagnostic testing for coronary artery disease includes: echocardiogram. Previous history of cardiac disease includes Chest Pain. Coronary artery disease risk factors include: diabetes mellitus, obesity (BMI >= 30 kg/m2) and smoking/ tobacco exposure. Patient denies history of CHF, coronary artery disease, pericarditis, previous M.I. and valvular disease.  Echo 06/27/13 Study Conclusions - Procedure narrative: Transthoracic echocardiography. The study was technically difficult, as a result of poor acoustic windows, poor sound wave transmission, and body habitus. - Left ventricle: The cavity size was normal. Wall thickness was normal. Systolic function was normal. The estimated ejection fraction was in the range of 55% to 60%. Although no diagnostic regional wall motion abnormality was identified, this possibility cannot be completely excluded on the basis of this study. The study is not technically sufficient to allow evaluation of LV diastolic function.  Review of Systems Pertinent items are noted in HPI. Further review of systems was otherwise negative other than stated in HPI.  Patient Active Problem List   Diagnosis Date Noted  . ST elevation myocardial infarction (STEMI) 10/24/2013  . Depression, acute 08/25/2013  . Neck pain on right side 07/27/2012  . Type II or unspecified type diabetes mellitus without mention of  complication, uncontrolled 07/07/2012  . Low back pain 07/07/2012  . DJD (degenerative joint disease) of knee 07/07/2012   Past Medical History  Diagnosis Date  . Diabetes mellitus     2002; no meds since 2004    Past Surgical History  Procedure Laterality Date  . Cesarean section    . Gallbladder surgery    . Tubal ligation      2000  . Cesarean section  [phone]    Prescriptions prior to admission  Medication Sig Dispense Refill  . aspirin-acetaminophen-caffeine (EXCEDRIN MIGRAINE) 250-250-65 MG per tablet Take 2 tablets by mouth every 6 (six) hours as needed for pain.      . chlorpheniramine-HYDROcodone (TUSSIONEX) 10-8 MG/5ML LQCR Take 5 mLs by mouth every 12 (twelve) hours as needed for cough.  115 mL  0  . diazepam (VALIUM) 5 MG tablet Take 1 tablet (5 mg total) by mouth every 8 (eight) hours as needed for anxiety.  10 tablet  0  . HYDROcodone-acetaminophen (NORCO/VICODIN) 5-325 MG per tablet Take 1-2 tablets by mouth every 4 (four) hours as needed.  15 tablet  0  . ibuprofen (ADVIL,MOTRIN) 200 MG tablet Take 200 mg by mouth every 6 (six) hours as needed for mild pain.      . Ibuprofen-Diphenhydramine Cit (ADVIL PM PO) Take by mouth.      . insulin detemir (LEVEMIR) 100 UNIT/ML injection Inject 0.5 mLs (50 Units total) into the skin daily.  10 mL  11  . levofloxacin (LEVAQUIN) 500 MG tablet Take 500 mg by mouth daily.      . SitaGLIPtin-MetFORMIN HCl (JANUMET XR) 50-1000 MG TB24 Take 2 tablets by  mouth daily.  180 tablet  1   Allergies  Allergen Reactions  . Erythromycin Anaphylaxis  . Penicillins Anaphylaxis  . Viibryd [Vilazodone Hcl]     Altered mental status.      History  Substance Use Topics  . Smoking status: Current Every Day Smoker -- 1.00 packs/day for 20 years    Types: Cigarettes  . Smokeless tobacco: Not on file  . Alcohol Use: 0.0 oz/week     Comment: occasional    Family History  Problem Relation Age of Onset  . Arthritis Mother   . Diabetes  Mother   . Heart disease Father   . Stroke Father   . Cancer Neg Hx   . Alcohol abuse Neg Hx   . Early death Neg Hx   . Kidney disease Neg Hx   . Hypertension Neg Hx   . Hyperlipidemia Neg Hx   . Diabetes Maternal Grandfather      Objective:  Patient Vitals for the past 8 hrs:  BP Pulse Resp  10/24/13 0456 110/70 mmHg 91 18   General appearance: alert, cooperative, obese female in moderate distress Head: Normocephalic, without obvious abnormality, atraumatic Eyes: conjunctivae/corneas clear. PERRL, EOM's intact. Fundi benign. Neck: no carotid bruit, no JVD and supple, symmetrical, trachea midline Lungs: clear to auscultation bilaterally Chest wall: no tenderness Heart: regular rate and rhythm, S1, S2 normal, no murmur, click, rub or gallop Abdomen: soft, non-tender; bowel sounds normal; no masses,  no organomegaly Extremities: extremities normal, atraumatic, no cyanosis or edema Pulses: 2+ and symmetric Neurologic: Grossly normal  Results for orders placed during the hospital encounter of 10/24/13 (from the past 48 hour(s))  CBC     Status: Abnormal   Collection Time    10/24/13  5:03 AM      Result Value Ref Range   WBC 14.5 (*) 4.0 - 10.5 K/uL   RBC 5.10  3.87 - 5.11 MIL/uL   Hemoglobin 14.4  12.0 - 15.0 g/dL   HCT 39.6  36.0 - 46.0 %   MCV 77.6 (*) 78.0 - 100.0 fL   MCH 28.2  26.0 - 34.0 pg   MCHC 36.4 (*) 30.0 - 36.0 g/dL   RDW 13.9  11.5 - 15.5 %   Platelets 329  150 - 400 K/uL  COMPREHENSIVE METABOLIC PANEL     Status: Abnormal   Collection Time    10/24/13  5:03 AM      Result Value Ref Range   Sodium 138  137 - 147 mEq/L   Potassium 3.5 (*) 3.7 - 5.3 mEq/L   Chloride 99  96 - 112 mEq/L   CO2 20  19 - 32 mEq/L   Glucose, Bld 292 (*) 70 - 99 mg/dL   BUN 6  6 - 23 mg/dL   Creatinine, Ser 0.42 (*) 0.50 - 1.10 mg/dL   Calcium 8.9  8.4 - 10.5 mg/dL   Total Protein 7.2  6.0 - 8.3 g/dL   Albumin 3.5  3.5 - 5.2 g/dL   AST 15  0 - 37 U/L   ALT 18  0 - 35 U/L    Alkaline Phosphatase 120 (*) 39 - 117 U/L   Total Bilirubin 0.3  0.3 - 1.2 mg/dL   GFR calc non Af Amer >90  >90 mL/min   GFR calc Af Amer >90  >90 mL/min   Comment: (NOTE)     The eGFR has been calculated using the CKD EPI equation.     This calculation  has not been validated in all clinical situations.     eGFR's persistently <90 mL/min signify possible Chronic Kidney     Disease.  POCT I-STAT TROPONIN I     Status: Abnormal   Collection Time    10/24/13  5:07 AM      Result Value Ref Range   Troponin i, poc 0.21 (*) 0.00 - 0.08 ng/mL   Comment NOTIFIED PHYSICIAN     Comment 3            Comment: Due to the release kinetics of cTnI,     a negative result within the first hours     of the onset of symptoms does not rule out     myocardial infarction with certainty.     If myocardial infarction is still suspected,     repeat the test at appropriate intervals.  POCT I-STAT, CHEM 8     Status: Abnormal   Collection Time    10/24/13  5:09 AM      Result Value Ref Range   Sodium 138  137 - 147 mEq/L   Potassium 3.6 (*) 3.7 - 5.3 mEq/L   Chloride 100  96 - 112 mEq/L   BUN 3 (*) 6 - 23 mg/dL   Creatinine, Ser 0.40 (*) 0.50 - 1.10 mg/dL   Glucose, Bld 300 (*) 70 - 99 mg/dL   Calcium, Ion 1.03 (*) 1.12 - 1.23 mmol/L   TCO2 24  0 - 100 mmol/L   Hemoglobin 15.3 (*) 12.0 - 15.0 g/dL   HCT 45.0  36.0 - 46.0 %  CG4 I-STAT (LACTIC ACID)     Status: Abnormal   Collection Time    10/24/13  5:09 AM      Result Value Ref Range   Lactic Acid, Venous 2.49 (*) 0.5 - 2.2 mmol/L   Dg Chest Portable 1 View  10/24/2013   CLINICAL DATA:  Chest pain.  EXAM: PORTABLE CHEST - 1 VIEW  COMPARISON:  09/04/2013  FINDINGS: Low lung volumes. No confluent opacities or effusions. Heart is normal size. No acute bony abnormality.  IMPRESSION: No acute cardiopulmonary disease.   Electronically Signed   By: Rolm Baptise M.D.   On: 10/24/2013 05:10    ECG:  Previous 10/14- Sinus Tach HR 103 with nonspecific T  wave changes.  2/15  Now with new ST elevation anterior and lateral leads with ST depression inferiorly.  Assessment/Plan: Acute STEMI -Emergent [facility] possible PCI -No contraindications for DAPT -ICU post procedure, Continuous monitoring on Telemetry -Repeat ekg on admit, prn chest pain or arrythmia -Trend cardiac biomarkers, check lipids, hgba1c, tsh -Medical management to include ASA, BB, Statin, NTG prn -Add ACEi if BP will tolerate  DM-uncontrolled -hold metformin -SSI  Tobacco abuse -Cessation, provide counseling  Obesity -lifestyle modifications

## 2013-10-24 NOTE — Interval H&P Note (Signed)
History and Physical Interval Note:  10/24/2013 6:17 AM  [name]  has presented today for surgery, with the diagnosis of stemi  The various methods of treatment have been discussed with the patient and family. After consideration of risks, benefits and other options for treatment, the patient has consented to  Procedure(s): LEFT HEART CATH (N/A) as a surgical intervention .  The patient's history has been reviewed, patient examined, no change in status, stable for surgery.  I have reviewed the patient's chart and labs.  Questions were answered to the patient's satisfaction.     Lorretta Harp

## 2013-10-24 NOTE — ED Provider Notes (Signed)
CSN: [id number]     Arrival date & time 10/24/13  0450 History   First MD Initiated Contact with Patient 10/24/13 0454     Chief Complaint  Patient presents with  . Chest Pain  . Code STEMI     (Consider location/radiation/quality/duration/timing/severity/associated sxs/prior Treatment) HPI Comments: 38 y.o. female with history of diabetes mellitus type 2 and ongoing tobacco abuse and family hx of premature CAD - presents to the ER because of chest pain. Pt called EMS after chest pain, described as pressure like, woke her up from sleep. + diaphoresis. Pt reports having similar chest pain over the last few months, just not as severe. Pt has no numbness, tingling, back pain, leg pain, visual complains and the pain is not radiating to the back. No family hx of musculoskeletal dz and no personal hx of the same. No cocaine use.  EMS has given full dose ASA and nitro.  Patient is a 38 y.o. female presenting with chest pain. The history is provided by the patient and medical records.  Chest Pain Associated symptoms: nausea   Associated symptoms: no abdominal pain, no headache, no shortness of breath and not vomiting     Past Medical History  Diagnosis Date  . Diabetes mellitus     2002; no meds since 2004   Past Surgical History  Procedure Laterality Date  . Cesarean section    . Gallbladder surgery    . Tubal ligation      2000  . Cesarean section  [phone]   Family History  Problem Relation Age of Onset  . Arthritis Mother   . Diabetes Mother   . Heart disease Father   . Stroke Father   . Cancer Neg Hx   . Alcohol abuse Neg Hx   . Early death Neg Hx   . Kidney disease Neg Hx   . Hypertension Neg Hx   . Hyperlipidemia Neg Hx   . Diabetes Maternal Grandfather    History  Substance Use Topics  . Smoking status: Current Every Day Smoker -- 1.00 packs/day for 20 years    Types: Cigarettes  . Smokeless tobacco: Not on file  . Alcohol Use: 0.0 oz/week     Comment:  occasional   OB History   Grav Para Term Preterm Abortions TAB SAB Ect Mult Living   4 3 3  1  1   3      Review of Systems  Respiratory: Negative for shortness of breath.   Cardiovascular: Positive for chest pain.  Gastrointestinal: Positive for nausea. Negative for vomiting and abdominal pain.  Genitourinary: Negative for dysuria.  Musculoskeletal: Negative for neck pain.  Neurological: Negative for headaches.  All other systems reviewed and are negative.      Allergies  Erythromycin; Penicillins; and Viibryd  Home Medications   Current Outpatient Rx  Name  Route  Sig  Dispense  Refill  . aspirin-acetaminophen-caffeine (EXCEDRIN MIGRAINE) 250-250-65 MG per tablet   Oral   Take 2 tablets by mouth every 6 (six) hours as needed for pain.         . chlorpheniramine-HYDROcodone (TUSSIONEX) 10-8 MG/5ML LQCR   Oral   Take 5 mLs by mouth every 12 (twelve) hours as needed for cough.   115 mL   0   . diazepam (VALIUM) 5 MG tablet   Oral   Take 1 tablet (5 mg total) by mouth every 8 (eight) hours as needed for anxiety.   10 tablet   0   .  HYDROcodone-acetaminophen (NORCO/VICODIN) 5-325 MG per tablet   Oral   Take 1-2 tablets by mouth every 4 (four) hours as needed.   15 tablet   0   . ibuprofen (ADVIL,MOTRIN) 200 MG tablet   Oral   Take 200 mg by mouth every 6 (six) hours as needed for mild pain.         . Ibuprofen-Diphenhydramine Cit (ADVIL PM PO)   Oral   Take by mouth.         . insulin detemir (LEVEMIR) 100 UNIT/ML injection   Subcutaneous   Inject 0.5 mLs (50 Units total) into the skin daily.   10 mL   11   . levofloxacin (LEVAQUIN) 500 MG tablet   Oral   Take 500 mg by mouth daily.         . SitaGLIPtin-MetFORMIN HCl (JANUMET XR) 50-1000 MG TB24   Oral   Take 2 tablets by mouth daily.   180 tablet   1    There were no vitals taken for this visit. Physical Exam  Nursing note and vitals reviewed. Constitutional: She is oriented to  person, place, and time. She appears well-developed and well-nourished.  HENT:  Head: Normocephalic and atraumatic.  Eyes: EOM are normal. Pupils are equal, round, and reactive to light.  Neck: Neck supple.  Cardiovascular: Normal rate, regular rhythm, normal heart sounds and intact distal pulses.   No murmur heard. Pulmonary/Chest: Effort normal. No respiratory distress.  Abdominal: Soft. She exhibits no distension. There is no tenderness. There is no rebound and no guarding.  Neurological: She is alert and oriented to person, place, and time.  Skin: Skin is warm and dry.    ED Course  Procedures (including critical care time) Labs Review Labs Reviewed  CBC  COMPREHENSIVE METABOLIC PANEL  APTT  PROTIME-INR   Imaging Review No results found.       Date: 10/24/2013  Rate: 92  Rhythm: normal sinus rhythm  QRS Axis: normal  Intervals: normal  ST/T Wave abnormalities: ST elevations anteriorly and ST depressions inferiorly  Conduction Disutrbances:none  Narrative Interpretation:   Old EKG Reviewed: changes noted     MDM   Final diagnoses:  None    Pt come in with cc of chest pain. Has CAD risk factors - and EKG shows anterior STEMI. Code STEMI activated immediately upon EMS's transfer of the ekg. ASA given, heparin given. IV established - and transferred to cath lab.  Varney Biles, MD 10/24/13 [phone]

## 2013-10-25 ENCOUNTER — Encounter (HOSPITAL_COMMUNITY): Payer: Self-pay | Admitting: Interventional Cardiology

## 2013-10-25 ENCOUNTER — Encounter (HOSPITAL_COMMUNITY)
Admission: EM | Disposition: A | Discharge status: Home / Self Care | Payer: Self-pay | Source: Home / Self Care | Attending: Cardiovascular Disease

## 2013-10-25 DIAGNOSIS — E669 Obesity, unspecified: Secondary | ICD-10-CM | POA: Diagnosis present

## 2013-10-25 DIAGNOSIS — IMO0001 Reserved for inherently not codable concepts without codable children: Secondary | ICD-10-CM

## 2013-10-25 DIAGNOSIS — E1165 Type 2 diabetes mellitus with hyperglycemia: Secondary | ICD-10-CM

## 2013-10-25 DIAGNOSIS — I219 Acute myocardial infarction, unspecified: Secondary | ICD-10-CM

## 2013-10-25 LAB — CBC
HCT: 34 % — ABNORMAL LOW (ref 36.0–46.0)
HEMOGLOBIN: 11.8 g/dL — AB (ref 12.0–15.0)
MCH: 27.4 pg (ref 26.0–34.0)
MCHC: 34.7 g/dL (ref 30.0–36.0)
MCV: 78.9 fL (ref 78.0–100.0)
Platelets: 269 10*3/uL (ref 150–400)
RBC: 4.31 MIL/uL (ref 3.87–5.11)
RDW: 14 % (ref 11.5–15.5)
WBC: 9.6 10*3/uL (ref 4.0–10.5)

## 2013-10-25 LAB — LIPID PANEL
Cholesterol: 129 mg/dL (ref 0–200)
HDL: 22 mg/dL — ABNORMAL LOW (ref >39)
LDL Cholesterol: 86 mg/dL (ref 0–99)
Total CHOL/HDL Ratio: 5.9 RATIO
Triglycerides: 107 mg/dL (ref <150)
VLDL: 21 mg/dL (ref 0–40)

## 2013-10-25 LAB — GLUCOSE, CAPILLARY
GLUCOSE-CAPILLARY: 193 mg/dL — AB (ref 70–99)
Glucose-Capillary: 187 mg/dL — ABNORMAL HIGH (ref 70–99)
Glucose-Capillary: 143 mg/dL — ABNORMAL HIGH (ref 70–99)
Glucose-Capillary: 248 mg/dL — ABNORMAL HIGH (ref 70–99)

## 2013-10-25 LAB — BASIC METABOLIC PANEL
BUN: 6 mg/dL (ref 6–23)
CHLORIDE: 101 meq/L (ref 96–112)
CO2: 25 meq/L (ref 19–32)
CREATININE: 0.42 mg/dL — AB (ref 0.50–1.10)
Calcium: 8.1 mg/dL — ABNORMAL LOW (ref 8.4–10.5)
GFR calc Af Amer: >90 mL/min (ref >90)
GFR calc non Af Amer: >90 mL/min (ref >90)
Glucose, Bld: 192 mg/dL — ABNORMAL HIGH (ref 70–99)
Potassium: 3.5 mEq/L — ABNORMAL LOW (ref 3.7–5.3)
Sodium: 137 mEq/L (ref 137–147)

## 2013-10-25 LAB — PROTIME-INR
INR: 1.13 (ref 0.00–1.49)
Prothrombin Time: 14.3 seconds (ref 11.6–15.2)

## 2013-10-25 LAB — CK TOTAL AND CKMB (NOT AT ARMC)
CK, MB: 57.2 ng/mL (ref 0.3–4.0)
Relative Index: 7.9 — ABNORMAL HIGH (ref 0.0–2.5)
Total CK: 724 U/L — ABNORMAL HIGH (ref 7–177)

## 2013-10-25 SURGERY — LEFT HEART CATHETERIZATION WITH CORONARY ANGIOGRAM
Anesthesia: LOCAL

## 2013-10-25 MED ORDER — ACETAMINOPHEN 325 MG PO TABS: 650.0000 mg | ORAL_TABLET | ORAL | Status: DC | PRN

## 2013-10-25 MED ORDER — ASPIRIN 81 MG PO CHEW: 81.0000 mg | CHEWABLE_TABLET | ORAL | Status: DC

## 2013-10-25 MED ORDER — SODIUM CHLORIDE 0.9 % IJ SOLN
3.0000 mL | Freq: Two times a day (BID) | INTRAMUSCULAR | Status: DC
  Administered 2013-10-25 – 2013-10-26 (×3): 3 mL via INTRAVENOUS

## 2013-10-25 MED ORDER — SODIUM CHLORIDE 0.9 % IJ SOLN: 3.0000 mL | INTRAMUSCULAR | Status: DC | PRN

## 2013-10-25 MED ORDER — SODIUM CHLORIDE 0.9 % IV SOLN: 250.0000 mL | INTRAVENOUS | Status: DC | PRN

## 2013-10-25 MED ORDER — ONDANSETRON HCL 4 MG/2ML IJ SOLN: 4.0000 mg | Freq: Four times a day (QID) | INTRAMUSCULAR | Status: DC | PRN

## 2013-10-25 MED ORDER — POTASSIUM CHLORIDE CRYS ER 20 MEQ PO TBCR
40.0000 meq | EXTENDED_RELEASE_TABLET | Freq: Once | ORAL | Status: AC
  Administered 2013-10-25: 40 meq via ORAL
  Filled 2013-10-25: qty 2

## 2013-10-25 MED ORDER — SODIUM CHLORIDE 0.9 % IV SOLN
INTRAVENOUS | Status: DC
  Administered 2013-10-25 (×2): via INTRAVENOUS

## 2013-10-25 NOTE — Progress Notes (Signed)
[id number] Patient stated she walked from other unit so did not walk with her. Stated she was going to have re-look cath. MI ed started. Reviewed MI restrictions, NTG use, stent/brilinta( pt did not have stent card so made a copy of stent number from chart), risk factors. Pt quit smoking for about year once by reducing number per day. She wants to try this way again. Stated husband and son to try to quit with her. Gave smoking cessation handouts and number for classes if needed. Discussed CRP2 and pt gave permission to refer to [facility].  Will complete ed tomorrow. Graylon Good RN BSN 10/25/2013 1:50 PM

## 2013-10-25 NOTE — Interval H&P Note (Signed)
History and Physical Interval Note:  10/25/2013 4:07 PM Spoke to the patient in her room on 3 [address]. We discussed the procedure including the risks of stroke, death, myocardial infarction, bleeding, allergy, kidney injury, among others. After considerable discussion, the patient refused to have the procedure performed. She prefers to have some other means of evaluation.  At the patient's request, the procedure was canceled.   Maureen Price

## 2013-10-25 NOTE — H&P (View-Only) (Signed)
Patient Name: Maureen Price Date of Encounter: 10/25/2013     Active Problems:   ST elevation myocardial infarction (STEMI)   STEMI (ST elevation myocardial infarction)   Acute myocardial infarction of other anterior wall, initial episode of care   Paroxysmal ventricular tachycardia    SUBJECTIVE  No chest pain, SOB, she has been up to go to the bathroom and is feeling okay. Had one 7 beat episode of VT associated with symptoms. No n/v, belly pain, swelling.   CURRENT MEDS . aspirin  81 mg Oral Daily  . aspirin EC  81 mg Oral Daily  . atorvastatin  80 mg Oral q1800  . insulin aspart  0-15 Units Subcutaneous TID WC  . insulin aspart  0-5 Units Subcutaneous QHS  . insulin detemir  50 Units Subcutaneous Daily  . levofloxacin  500 mg Oral Daily  . metoprolol tartrate  12.5 mg Oral BID  . Ticagrelor  90 mg Oral BID    OBJECTIVE  Filed Vitals:   10/25/13 0400 10/25/13 0500 10/25/13 0600 10/25/13 0700  BP: 98/57 93/60  94/62  Pulse: 87 81 79 86  Temp: 98 F (36.7 C)     TempSrc: Oral     Resp: 17 22 22 25   Height:      Weight:   253 lb 12 oz (115.1 kg)   SpO2: 92% 96% 96% 92%    Intake/Output Summary (Last 24 hours) at 10/25/13 0744 Last data filed at 10/25/13 0200  Gross per 24 hour  Intake 774.71 ml  Output   1850 ml  Net -1075.29 ml   Filed Weights   10/24/13 0517 10/24/13 0800 10/25/13 0600  Weight: 238 lb 1.6 oz (108 kg) 238 lb 1.6 oz (108 kg) 253 lb 12 oz (115.1 kg)    PHYSICAL EXAM  General: Pleasant, NAD. obese Neuro: Alert and oriented X 3. Moves all extremities spontaneously. Psych: Normal affect. HEENT:  Normal  Neck: Supple without bruits or JVD. Lungs:  Resp regular and unlabored, CTA. She has a chronic cough Heart: RRR no s3, s4, or murmurs. Abdomen: Soft, non-tender, non-distended, BS + x 4.  Extremities: No clubbing, cyanosis or edema. DP/PT/Radials 2+ and equal bilaterally.  Accessory Clinical Findings  CBC  Recent Labs  10/24/13 1038 10/24/13 1238 10/25/13 0300  WBC 9.6  --  9.6  HGB 12.3 12.2 11.8*  HCT 34.3* 36.0 34.0*  MCV 77.6*  --  78.9  PLT 280  --  [id number]   Basic Metabolic Panel  Recent Labs  10/24/13 0503 10/24/13 0509 10/24/13 0700 10/24/13 1238 10/25/13 0300  NA 138 138  --  140 137  K 3.5* 3.6*  --  3.1* 3.5*  CL 99 100  --   --  101  CO2 20  --   --   --  25  GLUCOSE 292* 300*  --   --  192*  BUN 6 3*  --   --  6  CREATININE 0.42* 0.40*  --   --  0.42*  CALCIUM 8.9  --   --   --  8.1*  MG  --   --  1.5  --   --    Liver Function Tests  Recent Labs  10/24/13 0503  AST 15  ALT 18  ALKPHOS 120*  BILITOT 0.3  PROT 7.2  ALBUMIN 3.5    Cardiac Enzymes  Recent Labs  10/24/13 0622 10/24/13 1038 10/24/13 1934 10/25/13 0300  CKTOTAL  --   --  1166* 724*  CKMB  --   --  107.5* 57.2*  TROPONINI 4.75* >20.00* 17.05*  --     Hemoglobin A1C  Recent Labs  10/24/13 0700  HGBA1C 10.9*   Fasting Lipid Panel  Recent Labs  10/25/13 0300  CHOL 129  HDL 22*  LDLCALC 86  TRIG 107  CHOLHDL 5.9    TELE  NSR  Radiology/Studies  Dg Chest Portable 1 View  10/24/2013   CLINICAL DATA:  Chest pain.  EXAM: PORTABLE CHEST - 1 VIEW  COMPARISON:  09/04/2013  FINDINGS: Low lung volumes. No confluent opacities or effusions. Heart is normal size. No acute bony abnormality.  IMPRESSION: No acute cardiopulmonary disease.      CARDIAC CATHETERIZATION  History obtained from chart review. Maureen Price is a 38 year old moderately overweight African American female who presented with anterior ST segment elevation MI after being awakened in the 4:30 this morning with chest pain. She has a history of diabetes poorly controlled as well as family history of heart disease. She was brought emergently to the cath lab for angiography and intervention.  PROCEDURE DESCRIPTION:  The patient was brought to the second floor [facility] lab in the postabsorptive state. She was  premedicated with IV Versed and fentanyl. Her right groinwas prepped and shaved in usual sterile fashion. Xylocaine 1% was used for local anesthesia. A 6 French sheath was inserted into the right common femoral artery using standard Seldinger technique. 6 French right and left Judkins diagnostic catheters along with a 6 French pigtail catheter were used for selective coronary angiography, and left ventriculography. Visipaque dye was used for the entirety of the case (196 cc of contrast administered the patient's). Retrograde aortic, left ventricular and pullback pressures were recorded.  HEMODYNAMICS:  AO SYSTOLIC/AO DIASTOLIC: 16/10  LV SYSTOLIC/LV DIASTOLIC: 96/04  ANGIOGRAPHIC RESULTS:  1. Left main; normal  2. LAD; occluded after the first diagonal branch  3. Left circumflex; normal.  4. Right coronary artery; dominant with a filling defect in the proximal portion  5. Left ventriculography; RAO left ventriculogram was performed using  25 mL of Visipaque dye at 12 mL/second. The overall LVEF estimated  45 % With wall motion abnormalities moderate anteroapical hypokinesia  IMPRESSION:large anterior myocardial infarction with an occluded proximal LAD and a filling defect in the proximal RCA. Will proceed with aspiration thrombectomy, PCI and stenting using drug-eluting stent, Angiomax, and Aggrastat.  Procedure description: The patient received Angiomax bolus and ACT of 398. The patient received aspirin in the emergency room and was given 180 mg of by mouth Brilenta prior to intervention . Using a 6 [place] XB LAD 3.5 cm guide catheter along with an 014 pro-water guidewire and a 2 mm x 12 mm long angioplasty balloon antegrade flow was established. He was asked excessive amount of visible thrombus and therefore multiple passes using 2 different aspiration catheters was performed. The majority of the thrombus burden was aspirated with TIMI-3 flow. There was an extensive amount of "white thrombus removed.  Following this the stenting was performed with a 4 mm x 23 mm long Xience Alpine drug-eluting stent. This was deployed at 18 atmospheres. Postdilatation was performed with a 4.5 mm x 10 20 mm long noncompliant balloon at 16 atmospheres (4.5 mm) resulting in reduction of a total occlusion to 0% residual with TIMI-3 flow. There was a large wraparound LAD. The patient's pain resolved and she was hemodynamically stable with blood pressures in the 90s.   Overall impression: Successful direct  angioplasty of an occluded proximal LAD in the setting of an anterior STEMI with a drug-eluting stent and a door to balloon time of 23 minutes. Because of the large thrombus burden in the LAD as well as visible thrombus in the proximal RCA the patient will be treated with Aggrastat for 18 hours. Angiomax will be continued at low dose for 4 hours after which the sheath will be removed. The patient will be treated with aspirin, Brilenta, beta blocker and statin drug. Cardiac risk factor modification will be stressed including smoking cessation and tighter diabetes control    ASSESSMENT AND PLAN Maureen Price is a 38 y.o. female with a hx of obesity, uncontrolled DM, ongoing tobacco abuse and family hx of premature CAD, and no prior cardiac history who presented last night with anterior STEMI. She was rushed for emergent cath.  STEMI- s/p DES to LAD. Cath revealed LVEF ~45 % w/ WMA: moderate anteroapical hypokinesia. Large anterior myocardial infarction with an occluded proximal LAD and a filling defect in the proximal RCA. PCI and stenting using drug-eluting stent to LAD, Angiomax, and Aggrastat.  -- Troponin >20, CK-MB 107.5 -- No events on tele since a 7 beat run of VT. Continue to monitor, can transfer to tele. -- Continue ASA/Brilinta, statin and BB  -- Continue Aggrastat for 18 hours (started ~7:30am) D/C around 10pm    Hypokalemia- 3.4, up from 3.1 after 40 Kdur will give her another 1 time dose of Kdur 40mg     HTN- blood pressure soft. Continue to monitor  DM- uncontrolled. HgA1c 10.9. SSI per pharmacy   Tobacco abuse- patient education  Anemia- mild. H/H 11.8/34.0   Tyrell Antonio PA-C  Pager [phone]  I have examined the patient and reviewed assessment and plan and discussed with patient.  Agree with above as stated.  Patient walking without chest pain.  Mild right groin pain without evidence of hematoma. 2+ right PT pulse.  Will check with Dr Gwenlyn Found as to whether any further management of filling defect in RCA is needed.  She was treated with proonged tirofiban and angiomax.  No further VT. Titrate beta blocker as tolerated.  Over 30 minutes spent in face to face time by PA and MD with the patient regarding risk factor modification.    Maureen Husser S.    Plan for relook cath specifically to see if RCA filling defect has resolved.  D/w patient.  She will skip lunch.  Will do after 2PM.  Right radial should be ok as right groin was accessed yesterday for STEmi.

## 2013-10-25 NOTE — Progress Notes (Signed)
Pt transferred to 3W, report called to receiving RN. Pt ambulated to new room without complaints of chest pain or groin pain. Pt VS WNL, GCS 15 and pleasant during move. Bedside report given to RN. Pt safety maintained.

## 2013-10-25 NOTE — Care Management Note (Signed)
    Page 1 of 1   10/25/2013     10:22:25 AM   CARE MANAGEMENT NOTE 10/25/2013  Patient:  Maureen Price, Maureen Price   Account Number:  [id number]  Date Initiated:  10/24/2013  Documentation initiated by:  [facility]  Subjective/Objective Assessment:   STEMI - emergent cath     Action/Plan:   Anticipated DC Date:  10/27/2013   Anticipated DC Plan:  [place]  CM consult      Choice offered to / List presented to:             Status of service:  In process, will continue to follow Medicare Important Message given?   (If response is "NO", the following Medicare IM given date fields will be blank) Date Medicare IM given:   Date Additional Medicare IM given:    Discharge Disposition:    Per UR Regulation:  Reviewed for med. necessity/level of care/duration of stay  If discussed at [place] of Stay Meetings, dates discussed:    Comments:  ContactToshiye, Kever Spouse [phone]                 Neal,Sharon Mother [phone]  10-25-13 10:15am  Luz Lex, RNBSN [phone] Talked with patient about meds - gets meds from [place] on [address].  Confirmed does have insurance that she gets meds on - Is eligable for copay assistance plan with Brilinta.

## 2013-10-25 NOTE — Progress Notes (Addendum)
Patient Name: Maureen Price Date of Encounter: 10/25/2013     Active Problems:   ST elevation myocardial infarction (STEMI)   STEMI (ST elevation myocardial infarction)   Acute myocardial infarction of other anterior wall, initial episode of care   Paroxysmal ventricular tachycardia    SUBJECTIVE  No chest pain, SOB, she has been up to go to the bathroom and is feeling okay. Had one 7 beat episode of VT associated with symptoms. No n/v, belly pain, swelling.   CURRENT MEDS . aspirin  81 mg Oral Daily  . aspirin EC  81 mg Oral Daily  . atorvastatin  80 mg Oral q1800  . insulin aspart  0-15 Units Subcutaneous TID WC  . insulin aspart  0-5 Units Subcutaneous QHS  . insulin detemir  50 Units Subcutaneous Daily  . levofloxacin  500 mg Oral Daily  . metoprolol tartrate  12.5 mg Oral BID  . Ticagrelor  90 mg Oral BID    OBJECTIVE  Filed Vitals:   10/25/13 0400 10/25/13 0500 10/25/13 0600 10/25/13 0700  BP: 98/57 93/60  94/62  Pulse: 87 81 79 86  Temp: 98 F (36.7 C)     TempSrc: Oral     Resp: 17 22 22 25   Height:      Weight:   253 lb 12 oz (115.1 kg)   SpO2: 92% 96% 96% 92%    Intake/Output Summary (Last 24 hours) at 10/25/13 0744 Last data filed at 10/25/13 0200  Gross per 24 hour  Intake 774.71 ml  Output   1850 ml  Net -1075.29 ml   Filed Weights   10/24/13 0517 10/24/13 0800 10/25/13 0600  Weight: 238 lb 1.6 oz (108 kg) 238 lb 1.6 oz (108 kg) 253 lb 12 oz (115.1 kg)    PHYSICAL EXAM  General: Pleasant, NAD. obese Neuro: Alert and oriented X 3. Moves all extremities spontaneously. Psych: Normal affect. HEENT:  Normal  Neck: Supple without bruits or JVD. Lungs:  Resp regular and unlabored, CTA. She has a chronic cough Heart: RRR no s3, s4, or murmurs. Abdomen: Soft, non-tender, non-distended, BS + x 4.  Extremities: No clubbing, cyanosis or edema. DP/PT/Radials 2+ and equal bilaterally.  Accessory Clinical Findings  CBC  Recent Labs  10/24/13 1038 10/24/13 1238 10/25/13 0300  WBC 9.6  --  9.6  HGB 12.3 12.2 11.8*  HCT 34.3* 36.0 34.0*  MCV 77.6*  --  78.9  PLT 280  --  Q000111Q   Basic Metabolic Panel  Recent Labs  10/24/13 0503 10/24/13 0509 10/24/13 0700 10/24/13 1238 10/25/13 0300  NA 138 138  --  140 137  K 3.5* 3.6*  --  3.1* 3.5*  CL 99 100  --   --  101  CO2 20  --   --   --  25  GLUCOSE 292* 300*  --   --  192*  BUN 6 3*  --   --  6  CREATININE 0.42* 0.40*  --   --  0.42*  CALCIUM 8.9  --   --   --  8.1*  MG  --   --  1.5  --   --    Liver Function Tests  Recent Labs  10/24/13 0503  AST 15  ALT 18  ALKPHOS 120*  BILITOT 0.3  PROT 7.2  ALBUMIN 3.5    Cardiac Enzymes  Recent Labs  10/24/13 0622 10/24/13 1038 10/24/13 1934 10/25/13 0300  CKTOTAL  --   --  1166* 724*  CKMB  --   --  107.5* 57.2*  TROPONINI 4.75* >20.00* 17.05*  --     Hemoglobin A1C  Recent Labs  10/24/13 0700  HGBA1C 10.9*   Fasting Lipid Panel  Recent Labs  10/25/13 0300  CHOL 129  HDL 22*  LDLCALC 86  TRIG 107  CHOLHDL 5.9    TELE  NSR  Radiology/Studies  Dg Chest Portable 1 View  10/24/2013   CLINICAL DATA:  Chest pain.  EXAM: PORTABLE CHEST - 1 VIEW  COMPARISON:  09/04/2013  FINDINGS: Low lung volumes. No confluent opacities or effusions. Heart is normal size. No acute bony abnormality.  IMPRESSION: No acute cardiopulmonary disease.      CARDIAC CATHETERIZATION  History obtained from chart review. Ms. Towery is a 38 year old moderately overweight African American female who presented with anterior ST segment elevation MI after being awakened in the 4:30 this morning with chest pain. She has a history of diabetes poorly controlled as well as family history of heart disease. She was brought emergently to the cath lab for angiography and intervention.  PROCEDURE DESCRIPTION:  The patient was brought to the second floor [facility] lab in the postabsorptive state. She was  premedicated with IV Versed and fentanyl. Her right groinwas prepped and shaved in usual sterile fashion. Xylocaine 1% was used for local anesthesia. A 6 French sheath was inserted into the right common femoral artery using standard Seldinger technique. 6 French right and left Judkins diagnostic catheters along with a 6 French pigtail catheter were used for selective coronary angiography, and left ventriculography. Visipaque dye was used for the entirety of the case (196 cc of contrast administered the patient's). Retrograde aortic, left ventricular and pullback pressures were recorded.  HEMODYNAMICS:  AO SYSTOLIC/AO DIASTOLIC: 16/10  LV SYSTOLIC/LV DIASTOLIC: 96/04  ANGIOGRAPHIC RESULTS:  1. Left main; normal  2. LAD; occluded after the first diagonal branch  3. Left circumflex; normal.  4. Right coronary artery; dominant with a filling defect in the proximal portion  5. Left ventriculography; RAO left ventriculogram was performed using  25 mL of Visipaque dye at 12 mL/second. The overall LVEF estimated  45 % With wall motion abnormalities moderate anteroapical hypokinesia  IMPRESSION:large anterior myocardial infarction with an occluded proximal LAD and a filling defect in the proximal RCA. Will proceed with aspiration thrombectomy, PCI and stenting using drug-eluting stent, Angiomax, and Aggrastat.  Procedure description: The patient received Angiomax bolus and ACT of 398. The patient received aspirin in the emergency room and was given 180 mg of by mouth Brilenta prior to intervention . Using a 6 [place] XB LAD 3.5 cm guide catheter along with an 014 pro-water guidewire and a 2 mm x 12 mm long angioplasty balloon antegrade flow was established. He was asked excessive amount of visible thrombus and therefore multiple passes using 2 different aspiration catheters was performed. The majority of the thrombus burden was aspirated with TIMI-3 flow. There was an extensive amount of "white thrombus removed.  Following this the stenting was performed with a 4 mm x 23 mm long Xience Alpine drug-eluting stent. This was deployed at 18 atmospheres. Postdilatation was performed with a 4.5 mm x 10 20 mm long noncompliant balloon at 16 atmospheres (4.5 mm) resulting in reduction of a total occlusion to 0% residual with TIMI-3 flow. There was a large wraparound LAD. The patient's pain resolved and she was hemodynamically stable with blood pressures in the 90s.   Overall impression: Successful direct  angioplasty of an occluded proximal LAD in the setting of an anterior STEMI with a drug-eluting stent and a door to balloon time of 23 minutes. Because of the large thrombus burden in the LAD as well as visible thrombus in the proximal RCA the patient will be treated with Aggrastat for 18 hours. Angiomax will be continued at low dose for 4 hours after which the sheath will be removed. The patient will be treated with aspirin, Brilenta, beta blocker and statin drug. Cardiac risk factor modification will be stressed including smoking cessation and tighter diabetes control    ASSESSMENT AND PLAN GEENA WEINHOLD is a 38 y.o. female with a hx of obesity, uncontrolled DM, ongoing tobacco abuse and family hx of premature CAD, and no prior cardiac history who presented last night with anterior STEMI. She was rushed for emergent cath.  STEMI- s/p DES to LAD. Cath revealed LVEF ~45 % w/ WMA: moderate anteroapical hypokinesia. Large anterior myocardial infarction with an occluded proximal LAD and a filling defect in the proximal RCA. PCI and stenting using drug-eluting stent to LAD, Angiomax, and Aggrastat.  -- Troponin >20, CK-MB 107.5 -- No events on tele since a 7 beat run of VT. Continue to monitor, can transfer to tele. -- Continue ASA/Brilinta, statin and BB  -- Continue Aggrastat for 18 hours (started ~7:30am) D/C around 10pm    Hypokalemia- 3.4, up from 3.1 after 40 Kdur will give her another 1 time dose of Kdur 40mg     HTN- blood pressure soft. Continue to monitor  DM- uncontrolled. HgA1c 10.9. SSI per pharmacy   Tobacco abuse- patient education  Anemia- mild. H/H 11.8/34.0   Tyrell Antonio PA-C  Pager [phone]  I have examined the patient and reviewed assessment and plan and discussed with patient.  Agree with above as stated.  Patient walking without chest pain.  Mild right groin pain without evidence of hematoma. 2+ right PT pulse.  Will check with Dr Gwenlyn Found as to whether any further management of filling defect in RCA is needed.  She was treated with proonged tirofiban and angiomax.  No further VT. Titrate beta blocker as tolerated.  Over 30 minutes spent in face to face time by PA and MD with the patient regarding risk factor modification.    Onia Shiflett S.    Plan for relook cath specifically to see if RCA filling defect has resolved.  D/w patient.  She will skip lunch.  Will do after 2PM.  Right radial should be ok as right groin was accessed yesterday for STEmi.

## 2013-10-25 NOTE — Progress Notes (Signed)
Inpatient Diabetes Program Recommendations  AACE/ADA: New Consensus Statement on Inpatient Glycemic Control (2013)  Target Ranges:  Prepandial:   less than 140 mg/dL      Peak postprandial:   less than 180 mg/dL (1-2 hours)      Critically ill patients:  140 - 180 mg/dL   Reason for Visit: Results for Maureen Price, Maureen Price (MRN [id number]) as of 10/25/2013 15:24  Ref. Range 10/25/2013 08:06 10/25/2013 13:10  Glucose-Capillary Latest Range: 70-99 mg/dL 187 (H) 193 (H)  Results for [name], [name] (MRN [id number]) as of 10/25/2013 15:24  Ref. Range 10/24/2013 07:00  Hemoglobin A1C Latest Range: <5.7 % 10.9 (H)   Diabetes history: Type 2 diabetes Outpatient Diabetes medications: Levemir 50 units daily, Janumet XR 06-999 mg 2 tabs daily. Current orders for Inpatient glycemic control: Novolog Moderate tid with meals and HS AND Levemir 50 units daily  Spoke to patient.  She states she has PCP who manages diabetes.  Explained that A1C is elevated and that she needs improved control.  She states she has met with educator in the past, but is not interested at this time.  CBG's improved today.  Question whether patient would benefit from the addition of rapid acting insulin with meals in order to cover post-prandial CBG's.  Consider adding Novolog 6 units tid with meals (hold if patient eats less than 50%).    Adah Perl, RN, BC-ADM Inpatient Diabetes Coordinator Pager [phone]

## 2013-10-26 ENCOUNTER — Encounter (HOSPITAL_COMMUNITY)
Admission: EM | Disposition: A | Discharge status: Home / Self Care | Payer: Self-pay | Source: Home / Self Care | Attending: Cardiovascular Disease

## 2013-10-26 ENCOUNTER — Encounter (HOSPITAL_COMMUNITY): Payer: Self-pay | Admitting: Cardiology

## 2013-10-26 DIAGNOSIS — E785 Hyperlipidemia, unspecified: Secondary | ICD-10-CM

## 2013-10-26 DIAGNOSIS — I2510 Atherosclerotic heart disease of native coronary artery without angina pectoris: Secondary | ICD-10-CM

## 2013-10-26 HISTORY — PX: LEFT HEART CATHETERIZATION WITH CORONARY ANGIOGRAM: SHX5451

## 2013-10-26 HISTORY — DX: Hyperlipidemia, unspecified: E78.5

## 2013-10-26 LAB — BASIC METABOLIC PANEL
BUN: 5 mg/dL — AB (ref 6–23)
CALCIUM: 8.6 mg/dL (ref 8.4–10.5)
CO2: 23 mEq/L (ref 19–32)
Chloride: 106 mEq/L (ref 96–112)
Creatinine, Ser: 0.44 mg/dL — ABNORMAL LOW (ref 0.50–1.10)
GFR calc Af Amer: >90 mL/min (ref >90)
GLUCOSE: 123 mg/dL — AB (ref 70–99)
Potassium: 3.8 mEq/L (ref 3.7–5.3)
SODIUM: 142 meq/L (ref 137–147)

## 2013-10-26 LAB — GLUCOSE, CAPILLARY
Glucose-Capillary: 221 mg/dL — ABNORMAL HIGH (ref 70–99)
Glucose-Capillary: 159 mg/dL — ABNORMAL HIGH (ref 70–99)
Glucose-Capillary: 195 mg/dL — ABNORMAL HIGH (ref 70–99)

## 2013-10-26 LAB — CBC
HEMATOCRIT: 33.6 % — AB (ref 36.0–46.0)
HEMOGLOBIN: 11.9 g/dL — AB (ref 12.0–15.0)
MCH: 27.9 pg (ref 26.0–34.0)
MCHC: 35.4 g/dL (ref 30.0–36.0)
MCV: 78.7 fL (ref 78.0–100.0)
Platelets: 258 10*3/uL (ref 150–400)
RBC: 4.27 MIL/uL (ref 3.87–5.11)
RDW: 14.2 % (ref 11.5–15.5)
WBC: 8.6 10*3/uL (ref 4.0–10.5)

## 2013-10-26 LAB — PROTIME-INR
INR: 1.04 (ref 0.00–1.49)
Prothrombin Time: 13.4 seconds (ref 11.6–15.2)

## 2013-10-26 SURGERY — LEFT HEART CATHETERIZATION WITH CORONARY ANGIOGRAM
Anesthesia: LOCAL

## 2013-10-26 MED ORDER — HEPARIN (PORCINE) IN NACL 2-0.9 UNIT/ML-% IJ SOLN
INTRAMUSCULAR | Status: AC
  Filled 2013-10-26: qty 1000

## 2013-10-26 MED ORDER — DIAZEPAM 5 MG PO TABS: 5.0000 mg | ORAL_TABLET | ORAL | Status: DC

## 2013-10-26 MED ORDER — SITAGLIP PHOS-METFORMIN HCL ER 50-1000 MG PO TB24: 2.0000 | ORAL_TABLET | Freq: Every day | ORAL | Status: DC

## 2013-10-26 MED ORDER — TICAGRELOR 90 MG PO TABS: 90.0000 mg | ORAL_TABLET | Freq: Two times a day (BID) | ORAL | Status: DC

## 2013-10-26 MED ORDER — SODIUM CHLORIDE 0.9 % IV SOLN: INTRAVENOUS | Status: AC

## 2013-10-26 MED ORDER — LIDOCAINE HCL (PF) 1 % IJ SOLN
INTRAMUSCULAR | Status: AC
  Filled 2013-10-26: qty 30

## 2013-10-26 MED ORDER — ACETAMINOPHEN 325 MG PO TABS: 650.0000 mg | ORAL_TABLET | ORAL | Status: DC | PRN

## 2013-10-26 MED ORDER — FENTANYL CITRATE 0.05 MG/ML IJ SOLN
INTRAMUSCULAR | Status: AC
  Filled 2013-10-26: qty 2

## 2013-10-26 MED ORDER — ATORVASTATIN CALCIUM 80 MG PO TABS: 80.0000 mg | ORAL_TABLET | Freq: Every day | ORAL | Status: DC

## 2013-10-26 MED ORDER — HEPARIN SODIUM (PORCINE) 1000 UNIT/ML IJ SOLN
INTRAMUSCULAR | Status: AC
  Filled 2013-10-26: qty 1

## 2013-10-26 MED ORDER — NITROGLYCERIN 0.2 MG/ML ON CALL CATH LAB
INTRAVENOUS | Status: AC
Start: 2013-10-26 — End: 2013-10-26
  Filled 2013-10-26: qty 1

## 2013-10-26 MED ORDER — SODIUM CHLORIDE 0.9 % IJ SOLN: 3.0000 mL | INTRAMUSCULAR | Status: DC | PRN

## 2013-10-26 MED ORDER — VERAPAMIL HCL 2.5 MG/ML IV SOLN
INTRAVENOUS | Status: AC
  Filled 2013-10-26: qty 2

## 2013-10-26 MED ORDER — LEVOFLOXACIN 500 MG PO TABS: 500.0000 mg | ORAL_TABLET | Freq: Every day | ORAL | Status: DC

## 2013-10-26 MED ORDER — METOPROLOL TARTRATE 25 MG PO TABS: 12.5000 mg | ORAL_TABLET | Freq: Two times a day (BID) | ORAL | Status: DC

## 2013-10-26 MED ORDER — NITROGLYCERIN 0.4 MG SL SUBL: 0.4000 mg | SUBLINGUAL_TABLET | SUBLINGUAL | Status: DC | PRN

## 2013-10-26 MED ORDER — ASPIRIN 81 MG PO TBEC: 81.0000 mg | DELAYED_RELEASE_TABLET | Freq: Every day | ORAL | Status: DC

## 2013-10-26 MED ORDER — MIDAZOLAM HCL 2 MG/2ML IJ SOLN
INTRAMUSCULAR | Status: AC
  Filled 2013-10-26: qty 2

## 2013-10-26 MED ORDER — SODIUM CHLORIDE 0.9 % IJ SOLN: 3.0000 mL | Freq: Two times a day (BID) | INTRAMUSCULAR | Status: DC

## 2013-10-26 MED ORDER — SODIUM CHLORIDE 0.9 % IV SOLN: 250.0000 mL | INTRAVENOUS | Status: DC

## 2013-10-26 NOTE — Progress Notes (Signed)
CARDIAC REHAB PHASE I   PRE:  Rate/Rhythm: 75SR  BP:  Supine:   Sitting: 100/72  Standing:    SaO2:   MODE:  Ambulation: 550 ft   POST:  Rate/Rhythm: 93SR  BP:  Supine:   Sitting: 130/80  Standing:    SaO2: 96%RA 1140-1215 Pt walked 550 ft independently with fluttering in chest once at about 475 ft. No CP. SOB improved from morning. Education completed re exercise and diet. Reviewed heart healthy and diabetic diets with carb counting. Diet adherence is going to be a challenge for pt. If pt does CRP 2, our dietitian can assist with education also. Referring to CRP 2.    Graylon Good, RN BSN  10/26/2013 12:14 PM

## 2013-10-26 NOTE — H&P (View-Only) (Signed)
SUBJECTIVE:  Refused cath because she got scared when the risks of the procedure were explained. Earlier this morning, she felt some fluttering in her chest and some SHOB.  No events on telemetry.  Sensation was different than what brought her to the hospital during the MI.  Overall, she feels ok.  OBJECTIVE:   Vitals:   Filed Vitals:   10/25/13 1314 10/25/13 1400 10/25/13 1915 10/26/13 0615  BP: 115/65  84/67 97/51  Pulse: 89  107 87  Temp: 97.8 F (36.6 C)  98.1 F (36.7 C) 98.7 F (37.1 C)  TempSrc: Oral  Oral Oral  Resp: 19  20 20   Height:      Weight:      SpO2: 95% 96% 98% 96%   I&O's:   Intake/Output Summary (Last 24 hours) at 10/26/13 1009 Last data filed at 10/25/13 2134  Gross per 24 hour  Intake 528.83 ml  Output      0 ml  Net 528.83 ml   TELEMETRY: Reviewed telemetry pt in NSR:     PHYSICAL EXAM General: Well developed, well nourished, in no acute distress Head: Eyes PERRLA, No xanthomas.   Normal cephalic and atramatic  Lungs:  Clear bilaterally to auscultation and percussion. Heart:  HRRR S1 S2 . No JVD.  No abdominal bruits. No femoral bruits. Abdomen: Obese abdomen soft and non-tender  Msk:  Back normal, normal gait. Normal strength and tone for age. Right groin sore Extremities:   No clubbing, cyanosis or edema.  DP +2 Neuro: Alert and oriented X 3. Psych:  Flat affect, responds appropriately   LABS: Basic Metabolic Panel:  Recent Labs  10/24/13 0503 10/24/13 0509 10/24/13 0700 10/24/13 1238 10/25/13 0300  NA 138 138  --  140 137  K 3.5* 3.6*  --  3.1* 3.5*  CL 99 100  --   --  101  CO2 20  --   --   --  25  GLUCOSE 292* 300*  --   --  192*  BUN 6 3*  --   --  6  CREATININE 0.42* 0.40*  --   --  0.42*  CALCIUM 8.9  --   --   --  8.1*  MG  --   --  1.5  --   --    Liver Function Tests:  Recent Labs  10/24/13 0503  AST 15  ALT 18  ALKPHOS 120*  BILITOT 0.3  PROT 7.2  ALBUMIN 3.5   No results found for this basename: LIPASE,  AMYLASE,  in the last 72 hours CBC:  Recent Labs  10/24/13 1038 10/24/13 1238 10/25/13 0300  WBC 9.6  --  9.6  HGB 12.3 12.2 11.8*  HCT 34.3* 36.0 34.0*  MCV 77.6*  --  78.9  PLT 280  --  269   Cardiac Enzymes:  Recent Labs  10/24/13 0622 10/24/13 1038 10/24/13 1934 10/25/13 0300  CKTOTAL  --   --  1166* 724*  CKMB  --   --  107.5* 57.2*  TROPONINI 4.75* >20.00* 17.05*  --    BNP: No components found with this basename: POCBNP,  D-Dimer: No results found for this basename: DDIMER,  in the last 72 hours Hemoglobin A1C:  Recent Labs  10/24/13 0700  HGBA1C 10.9*   Fasting Lipid Panel:  Recent Labs  10/25/13 0300  CHOL 129  HDL 22*  LDLCALC 86  TRIG 107  CHOLHDL 5.9   Thyroid Function Tests: No results found for this  basename: TSH, T4TOTAL, FREET3, T3FREE, THYROIDAB,  in the last 72 hours Anemia Panel: No results found for this basename: VITAMINB12, FOLATE, FERRITIN, TIBC, IRON, RETICCTPCT,  in the last 72 hours Coag Panel:   Lab Results  Component Value Date   INR 1.13 10/25/2013   INR 1.03 10/24/2013   INR 1.02 11/22/2012    RADIOLOGY: Dg Chest Portable 1 View  10/24/2013   CLINICAL DATA:  Chest pain.  EXAM: PORTABLE CHEST - 1 VIEW  COMPARISON:  09/04/2013  FINDINGS: Low lung volumes. No confluent opacities or effusions. Heart is normal size. No acute bony abnormality.  IMPRESSION: No acute cardiopulmonary disease.   Electronically Signed   By: Rolm Baptise M.D.   On: 10/24/2013 05:10      ASSESSMENT/PLAN:    Maureen Price is a 38 y.o. female with a hx of obesity, uncontrolled DM, ongoing tobacco abuse and family hx of premature CAD, and no prior cardiac history who presented last night with anterior STEMI. She was rushed for emergent cath.  STEMI- s/p DES to LAD. Cath revealed LVEF ~45 % w/ WMA: moderate anteroapical hypokinesia. Large anterior myocardial infarction with an occluded proximal LAD and a filling defect in the proximal RCA. PCI and  stenting using drug-eluting stent to LAD, Angiomax, and Aggrastat post procedure.  No bleeding complications.  -- Troponin >20, CK-MB 107.5  -- No events on tele since a 7 beat run of VT the day of the MI. Continue to monitor, can transfer to tele.  -- Continue ASA/Brilinta, statin and BB   Hypokalemia- resolved HTN-stable  DM- uncontrolled. HgA1c 10.9. SSI per pharmacy  Tobacco abuse- patient education   Refused cath.  We discussed relook at the RCA and the rationale behind this.  She is going to walk around.  If she has any sx, she will agree to cath.  Otherwise, will plan on d/c with prolonged dual antiplatelet therapy.  Education for RF modification is key.  She has multiple items from the [facility] in her room.  Will see what she decides.   Jettie Booze., MD  10/26/2013  10:09 AM

## 2013-10-26 NOTE — Progress Notes (Signed)
Inpatient Diabetes Program Recommendations  AACE/ADA: New Consensus Statement on Inpatient Glycemic Control (2013)  Target Ranges:  Prepandial:   less than 140 mg/dL      Peak postprandial:   less than 180 mg/dL (1-2 hours)      Critically ill patients:  140 - 180 mg/dL     Results for TYLIA, EWELL (MRN [id number]) as of 10/26/2013 12:26  Ref. Range 10/26/2013 07:47 10/26/2013 11:20  Glucose-Capillary Latest Range: 70-99 mg/dL 221 (H) 159 (H)    **Patient with elevated fasting glucose level despite Levemir 50 units daily   **MD- Please consider increasing Levemir to 55 units daily if patient continues to have elevated fasting glucose levels    Will follow. Wyn Quaker RN, MSN, CDE Diabetes Coordinator Inpatient Diabetes Program Team Pager: [phone] (8a-10p)

## 2013-10-26 NOTE — CV Procedure (Signed)
      Cardiac Catheterization Operative Report  Maureen Price [id number] 2/19/20155:10 PM [name], MD  Procedure Performed:  1. Left Heart Catheterization 2. Selective Coronary Angiography 3. Left ventricular angiogram  Operator: Lauree Chandler, MD  Arterial access site:  Right radial artery.   Indication: 38 yo female with history of CAD, recent anterior STEMI on 10/24/13 with occluded LAD treated with DES by Dr. Gwenlyn Found. Pt also noted to have thrombus in the proximal RCA with good flow. She was treated with ASA, Brilinta and 18 hours of Aggrastat. Relook cath today to assess the thrombus burden in the RCA and access patency of LAD stent with chest pain over last 24 hours, assess LV function.                                      Procedure Details: The risks, benefits, complications, treatment options, and expected outcomes were discussed with the patient. The patient and/or family concurred with the proposed plan, giving informed consent. The patient was brought to the cath lab after IV hydration was begun and oral premedication was given. The patient was further sedated with Versed and Fentanyl. The right wrist was assessed with an Allens test which was positive. The right wrist was prepped and draped in a sterile fashion. 1% lidocaine was used for local anesthesia. Using the modified Seldinger access technique, a 5 French sheath was placed in the right radial artery. 3 mg Verapamil was given through the sheath. 6000 units IV heparin was given. Standard diagnostic catheters were used to perform selective coronary angiography. A pigtail catheter was used to perform a left ventricular angiogram. The sheath was removed from the right radial artery and a Terumo hemostasis band was applied at the arteriotomy site on the right wrist.    There were no immediate complications. The patient was taken to the recovery area in stable condition.   Hemodynamic Findings: Central aortic  pressure: 98/63 Left ventricular pressure: 101/17/25  Angiographic Findings:  Left main: No obstructive disease.   Left Anterior Descending Artery: Large caliber vessel that courses to the apex. The proximal stent is patent without restenosis. There are two moderate caliber diagonal branches without obstructive disease.   Circumflex Artery: Large caliber vessel with large intermediate branch, large bifurcating obtuse marginal branch. No obstructive disease.   Right Coronary Artery: Large dominant vessel with no obstructive disease. The hazy filling defect seen on the prior angiogram on 10/24/13 was likely thrombus that has resolved with medical therapy.   Left Ventricular Angiogram: LVEF=50-55%  Impression: 1. Single vessel CAD with patent stent proximal LAD 2. Low normal LV systolic function 3. Resolution of thrombus seen in the proximal RCA on 10/24/13.   Recommendations: Continue medical therapy for CAD. Aggressive risk factor reduction.        Complications:  None. The patient tolerated the procedure well.

## 2013-10-26 NOTE — Progress Notes (Signed)
Pt c/o sensation in thenar eminence similar to leg falling asleep but without actual tingling. Says it feels like fake skin when she touches it. Radial site looks OK, good pulse in tact, capillary refill excellent seen even through acrylic nails. Full ROM. She has small hematoma proximal to the cath site from admission IV and mild forearm swelling - no hematoma at cath site. She had localized IV site infiltration on her left arm as well and she has some abnormal sensation from this too (says it occasionally runs up and down my arm). May have nerve sensation disruption from manipulation of blood vessel from cath vs compression from earlier IV hematoma. Hopeful this will improve overnight. Will observe for tonight for developing hematoma or worsening sx. Dayna Dunn PA-C

## 2013-10-26 NOTE — Progress Notes (Signed)
Pt complaining of right arm numbness and pain, she also said she feels like her R arm is swollen. Cardiology team made aware. Will continue to monitor. Radial pulse palpated. Radial site with no apparent complication, level 0. VS WNL. Ferdinand Lango, RN

## 2013-10-26 NOTE — Discharge Summary (Addendum)
Physician Discharge Summary       Patient ID: Maureen Price MRN: [id number] DOB/AGE: July 19, 1976 38 y.o.  Admit date: 10/24/2013 Discharge date: 10/27/2013  Discharge Diagnoses:  Principal Problem:   STEMI (ST elevation myocardial infarction), PTCA/Stent DES- [employer] [place]- LAD, large burden thrombus in LAD and RCA  Active Problems:   Type II or unspecified type diabetes mellitus without mention of complication, uncontrolled   Acute myocardial infarction of other anterior wall, initial episode of care   Paroxysmal ventricular tachycardia   Obesity   Dyslipidemia, goal LDL below 70   Discharged Condition: good  Procedures: 10/24/13 Successful direct angioplasty of an occluded proximal LAD in the setting of an anterior STEMI with a drug-eluting stent and a door to balloon time of 23 minutes. Because of the large thrombus burden in the LAD as well as visible thrombus in the proximal RCA by Dr. Gwenlyn Found.  10/26/13 cardiac cath re-look by Dr. Angelena Form to evaluate thrombus burden.   Hospital Course: 38 y.o. female who presented for evaluation of chest pain 10/24/13. She has hx of DM uncontrolled, but denies known CAD or prior MI. She woke up with chest pain around 430. Pain was severe, felt like bricks on her chest. She also had N/V x 2. She called EMS and was brought to ED. EMS sent ecg concerning for STEMI. She was given ASA and NTG by EMS. Still having 6/10 pain in the ER, IV heparin bolus given.  She was taken to the cath lab by Dr. Gwenlyn Found with results: ANGIOGRAPHIC RESULTS:  1. Left main; normal  2. LAD; occluded after the first diagonal branch  3. Left circumflex; normal.  4. Right coronary artery; dominant with a filling defect in the proximal portion  5. Left ventriculography; RAO left ventriculogram was performed using  25 mL of Visipaque dye at 12 mL/second. The overall LVEF estimated  45 % With wall motion abnormalities moderate anteroapical hypokinesia   She then underwent  successful direct angioplasty of an occluded proximal LAD in the setting of an anterior STEMI with a drug-eluting stent and a door to balloon time of 23 minutes. Because of the large thrombus burden in the LAD as well as visible thrombus in the proximal RCA the patient was treated with Aggrastat for 18 hours. A second cath to re-assess the thrombus burden in the RCA, access patency of LAD stent and assess LV function was scheduled.  She did have 8 beat run on NSVT during the night.  Her potassium was low and this was replaced.  Pk troponin at >20, decreasing prior to discharge.  Plan had been to re-study on the 18th but pt became afraid and refused the cath.  By 10/26/13 she was stable and ambulating without problems and did decide to proceed with cardiac cath which revealed,  Left Ventricular Angiogram: LVEF=50-55%  Impression:  1. Single vessel CAD with patent stent proximal LAD  2. Low normal LV systolic function  3. Resolution of thrombus seen in the proximal RCA on 10/24/13.  Recommendations: Continue medical therapy for CAD. Aggressive risk factor reduction  Patient complained of right arm numbness and tingling around right radial access site. Her radial site looked okay, there was a good pulse and capillary refill was excellent. She was observed overnight and her symptoms improved. The patient is also moderatly hypotensive but asymptomatic. She has been ambulating and denies feeling lightheadedness/dizzy or having syncope or near syncope. Dr. Irish Lack has evaluated the patient today and cleared her for discharge with  close OP f/u. She will continue DAPT with ASA + Brilinta, as well as statin and low dose BB. She has been given a written RX for free 30-day supply of Brilinta and instructed on how to use it. She has been instructed to not resume her Janumet until 10/29/13 (>48 hr after cath). Also, the importance of risk factor modification was stressed on many occasions. Follow up appointment has been  scheduled.   Consults: cardiology  Significant Diagnostic Studies:  BMET    Component Value Date/Time   NA 142 10/26/2013 1417   K 3.8 10/26/2013 1417   CL 106 10/26/2013 1417   CO2 23 10/26/2013 1417   GLUCOSE 123* 10/26/2013 1417   BUN 5* 10/26/2013 1417   CREATININE 0.44* 10/26/2013 1417   CALCIUM 8.6 10/26/2013 1417   GFRNONAA >90 10/26/2013 1417   GFRAA >90 10/26/2013 1417    CBC    Component Value Date/Time   WBC 8.6 10/26/2013 1417   RBC 4.27 10/26/2013 1417   HGB 11.9* 10/26/2013 1417   HCT 33.6* 10/26/2013 1417   PLT 258 10/26/2013 1417   MCV 78.7 10/26/2013 1417   MCH 27.9 10/26/2013 1417   MCHC 35.4 10/26/2013 1417   RDW 14.2 10/26/2013 1417   LYMPHSABS 3.4 08/25/2013 1132   MONOABS 0.6 08/25/2013 1132   EOSABS 0.1 08/25/2013 1132   BASOSABS 0.0 08/25/2013 1132   Troponin >20.0  , prior to discharge 17.05,  CK  1166  MB 107.5 with follow up of CK 724 and MB 57.2   T chol 129 TG 107 HDL 22 LDL 86   HgBA1c 10.9  PCXR: EXAM: PORTABLE CHEST - 1 VIEW COMPARISON: 09/04/2013 FINDINGS: Low lung volumes. No confluent opacities or effusions. Heart is normal size. No acute bony abnormality. IMPRESSION: No acute cardiopulmonary disease.     Discharge Exam: Blood pressure 89/56, pulse 77, temperature 98.2 F (36.8 C), temperature source Oral, resp. rate 18, height 5\' 4"  (1.626 m), weight 253 lb 12 oz (115.1 kg), SpO2 97.00%.    Disposition: 01-Home or Self Care      Discharge Orders   Future Appointments Provider Department Dept Phone   11/02/2013 1:40 PM Tarri Fuller, PA-C [facility] [phone]   Future Orders Complete By Expires   Amb Referral to Cardiac Rehabilitation  As directed        Medication List    STOP taking these medications       ADVIL PM PO     aspirin-acetaminophen-caffeine 250-250-65 MG per tablet  Commonly known as:  EXCEDRIN MIGRAINE     ibuprofen 200 MG tablet  Commonly known as:  ADVIL,MOTRIN      TAKE these  medications       acetaminophen 325 MG tablet  Commonly known as:  TYLENOL  Take 2 tablets (650 mg total) by mouth every 4 (four) hours as needed for headache or mild pain.     aspirin 81 MG EC tablet  Take 1 tablet (81 mg total) by mouth daily.     atorvastatin 80 MG tablet  Commonly known as:  LIPITOR  Take 1 tablet (80 mg total) by mouth daily at 6 PM.     diazepam 5 MG tablet  Commonly known as:  VALIUM  Take 1 tablet (5 mg total) by mouth every 8 (eight) hours as needed for anxiety.     insulin detemir 100 UNIT/ML injection  Commonly known as:  LEVEMIR  Inject 0.5 mLs (50 Units total) into the skin  daily.     levofloxacin 500 MG tablet  Commonly known as:  LEVAQUIN  Take 1 tablet (500 mg total) by mouth daily.     metoprolol tartrate 25 MG tablet  Commonly known as:  LOPRESSOR  Take 0.5 tablets (12.5 mg total) by mouth 2 (two) times daily.     nitroGLYCERIN 0.4 MG SL tablet  Commonly known as:  NITROSTAT  Place 1 tablet (0.4 mg total) under the tongue every 5 (five) minutes x 3 doses as needed for chest pain.     SitaGLIPtin-MetFORMIN HCl 50-1000 MG Tb24  Commonly known as:  JANUMET XR  Take 2 tablets by mouth daily.  Start taking on:  10/29/2013     Ticagrelor 90 MG Tabs tablet  Commonly known as:  BRILINTA  Take 1 tablet (90 mg total) by mouth 2 (two) times daily.       Follow-up Information   Follow up with Lorretta Harp, MD On 11/02/2013. (with his PA, Tarri Fuller at 1:40 pm)    Specialty:  Cardiology   Contact information:   [address] [place] [address] [phone]       Discharge Instructions: Call [facility] at [phone] if any bleeding, swelling or drainage at cath site.  May shower, no tub baths for 48 hours for groin sticks.    No driving for until seen in the office, no lifting over 5 pounds for 3 weeks.  No work until seen in the office.  Heart Heathy Diabetic Diet  See your primary doctor back  for improved control over your diabetes.  Take 1 NTG, under your tongue, while sitting.  If no relief of pain may repeat NTG, one tab every 5 minutes up to 3 tablets total over 15 minutes.  If no relief CALL 911.  If you have dizziness/lightheadness  while taking NTG, stop taking and call 911.        Do Not Stop Brilinta, stopping could cause a heart attack.     Do not start your Janumet until instructed on the 10/29/13  Signed: Perry Mount PA-C  [place]: [facility] 10/27/2013, 7:15 AM  Time spent on discharge :40 minutes counseling patient regarding medicines and lifestyle modifications.   I have examined the patient and reviewed assessment and plan and discussed with patient.  Agree with above as stated.  FMLA form filled out for mother.  Stressed importance of lifestyle modifications.  RCA thrombus resolved on yesterdays cath.  F/U with Dr. Gwenlyn Found. Return to work on 11/15/13.  Cleora Karnik S. Marland Kitchen

## 2013-10-26 NOTE — Progress Notes (Addendum)
Pt tolerated well. TR Band removed. Two by two & tegaderm applied to site. Site Level 0. Cardiologist notified of pt c/o of numbness & pain along with swelling of Rt arm. Pt educated on arm restrictions & site care. Cardiologist at bedside currently. Will continue to monitor the pt. Hoover Brunette, RN

## 2013-10-26 NOTE — Interval H&P Note (Signed)
History and Physical Interval Note:  10/26/2013 4:30 PM  CONNELLY SPRUELL  has presented today for cardiac cath with the diagnosis of CAD, recent STEMI with occluded LAD, also had thrombus in the RCA with good flow. Relook cath today to see if thrombus has resolved with medical therapy. The various methods of treatment have been discussed with the patient and family. After consideration of risks, benefits and other options for treatment, the patient has consented to  Procedure(s): LEFT HEART CATHETERIZATION WITH CORONARY ANGIOGRAM (N/A) as a surgical intervention .  The patient's history has been reviewed, patient examined, no change in status, stable for surgery.  I have reviewed the patient's chart and labs.  Questions were answered to the patient's satisfaction.    Cath Lab Visit (complete for each Cath Lab visit)  Clinical Evaluation Leading to the Procedure:   ACS: yes  Non-ACS:    Anginal Classification: CCS IV  Anti-ischemic medical therapy: Maximal Therapy (2 or more classes of medications)  Non-Invasive Test Results: No non-invasive testing performed  Prior CABG: No previous CABG         MCALHANY,CHRISTOPHER

## 2013-10-26 NOTE — Progress Notes (Signed)
Subjective: No pain but fluttering in chest, complained of SOB had to rest by the window.  Objective: Vital signs in last 24 hours: Temp:  [97.8 F (36.6 C)-98.7 F (37.1 C)] 98.7 F (37.1 C) (02/19 0615) Pulse Rate:  [87-107] 87 (02/19 0615) Resp:  [19-25] 20 (02/19 0615) BP: (84-115)/(51-67) 97/51 mmHg (02/19 0615) SpO2:  [95 %-98 %] 96 % (02/19 0615) Weight change:  Last BM Date: 10/23/13 Intake/Output from previous day: 02/18 0701 - 02/19 0700 In: 678.8 [P.O.:610; I.V.:68.8] Out: 875 [Urine:875] Intake/Output this shift:    PE: General:Pleasant affect, NAD Skin:Warm and dry, brisk capillary refill HEENT:normocephalic, sclera clear, mucus membranes moist Heart:S1S2 RRR without murmur, gallup, rub or click Lungs:clear without rales, rhonchi, or wheezes GBT:DVVO, non tender, + BS, do not palpate liver spleen or masses Ext:no lower ext edema, 2+ pedal pulses, 2+ radial pulses Neuro:alert and oriented, MAE, follows commands, + facial symmetry  Lab Results:  Recent Labs  10/24/13 1038 10/24/13 1238 10/25/13 0300  WBC 9.6  --  9.6  HGB 12.3 12.2 11.8*  HCT 34.3* 36.0 34.0*  PLT 280  --  269   BMET  Recent Labs  10/24/13 0503 10/24/13 0509 10/24/13 1238 10/25/13 0300  NA 138 138 140 137  K 3.5* 3.6* 3.1* 3.5*  CL 99 100  --  101  CO2 20  --   --  25  GLUCOSE 292* 300*  --  192*  BUN 6 3*  --  6  CREATININE 0.42* 0.40*  --  0.42*  CALCIUM 8.9  --   --  8.1*    Recent Labs  10/24/13 1038 10/24/13 1934  TROPONINI >20.00* 17.05*    Lab Results  Component Value Date   CHOL 129 10/25/2013   HDL 22* 10/25/2013   LDLCALC 86 10/25/2013   TRIG 107 10/25/2013   CHOLHDL 5.9 10/25/2013   Lab Results  Component Value Date   HGBA1C 10.9* 10/24/2013     Lab Results  Component Value Date   TSH 1.55 08/25/2013    Hepatic Function Panel  Recent Labs  10/24/13 0503  PROT 7.2  ALBUMIN 3.5  AST 15  ALT 18  ALKPHOS 120*  BILITOT 0.3     Recent Labs  10/25/13 0300  CHOL 129   No results found for this basename: PROTIME,  in the last 72 hours     Studies/Results: No results found.  Medications: I have reviewed the patient's current medications. Scheduled Meds: . aspirin  81 mg Oral Daily  . aspirin  81 mg Oral Pre-Cath  . aspirin EC  81 mg Oral Daily  . atorvastatin  80 mg Oral q1800  . insulin aspart  0-15 Units Subcutaneous TID WC  . insulin aspart  0-5 Units Subcutaneous QHS  . insulin detemir  50 Units Subcutaneous Daily  . levofloxacin  500 mg Oral Daily  . metoprolol tartrate  12.5 mg Oral BID  . sodium chloride  3 mL Intravenous Q12H  . Ticagrelor  90 mg Oral BID   Continuous Infusions: . sodium chloride 50 mL/hr at 10/25/13 2036   PRN Meds:.sodium chloride, acetaminophen, HYDROcodone-acetaminophen, morphine injection, nitroGLYCERIN, ondansetron (ZOFRAN) IV, sodium chloride  Assessment/Plan: Active Problems:   ST elevation myocardial infarction (STEMI)   STEMI (ST elevation myocardial infarction)   Acute myocardial infarction of other anterior wall, initial episode of care   Paroxysmal ventricular tachycardia   Obesity  PLAN: STEMI, refused cath.  Poorly controlled  diabetes, needs follow up with PCP  Now thinking she wants cath, she was afraid yesterday.  LOS: 2 days   Time spent with pt. :15 minutes. [facility] R  Nurse Practitioner Certified Pager [phone] or after 5pm and on weekends call [phone] 10/26/2013, 8:31 AM   I have examined the patient and reviewed assessment and plan and discussed with patient.  Agree with above as stated.  After multiple discussions with me and the Nurse Practitioner, she is agreeable to cath.  Over 30 minutes of time spent with the patient going over the benefits and risks of surgery in the setting of her RCA filling defect.  Discussed with Dr. Angelena Form as well. Reviewed cath films.  Would consider aspiration thrombectomy if defect is still there.    Maureen Morissette S.

## 2013-10-26 NOTE — Discharge Instructions (Signed)
Call [facility] at [phone] if any bleeding, swelling or drainage at cath site.  May shower, no tub baths for 48 hours for groin sticks.    No driving for until seen in the office, no lifting over 5 pounds for 3 weeks.  No work until seen in the office.  Heart Heathy Diabetic Diet  See your primary doctor back for improved control over your diabetes.  Take 1 NTG, under your tongue, while sitting.  If no relief of pain may repeat NTG, one tab every 5 minutes up to 3 tablets total over 15 minutes.  If no relief CALL 911.  If you have dizziness/lightheadness  while taking NTG, stop taking and call 911.        Do Not Stop Brilinta, stopping could cause a heart attack.     No Januvia/Metformin until 10/29/13, it may interact with cath dye.

## 2013-10-26 NOTE — Care Management (Signed)
1454 10-26-13 Pt has brilinta card and co pay will be less than 100.00. Pt has co pay card so hopefully pt will get medication cheaper than co pay cost. Pt will need Rx for 30 day Rx @ d/c. No further needs from CM at this time. Bethena Roys, RN,BSN [phone]

## 2013-10-26 NOTE — Progress Notes (Signed)
SUBJECTIVE:  Refused cath because she got scared when the risks of the procedure were explained. Earlier this morning, she felt some fluttering in her chest and some SHOB.  No events on telemetry.  Sensation was different than what brought her to the hospital during the MI.  Overall, she feels ok.  OBJECTIVE:   Vitals:   Filed Vitals:   10/25/13 1314 10/25/13 1400 10/25/13 1915 10/26/13 0615  BP: 115/65  84/67 97/51  Pulse: 89  107 87  Temp: 97.8 F (36.6 C)  98.1 F (36.7 C) 98.7 F (37.1 C)  TempSrc: Oral  Oral Oral  Resp: 19  20 20   Height:      Weight:      SpO2: 95% 96% 98% 96%   I&O's:   Intake/Output Summary (Last 24 hours) at 10/26/13 1009 Last data filed at 10/25/13 2134  Gross per 24 hour  Intake 528.83 ml  Output      0 ml  Net 528.83 ml   TELEMETRY: Reviewed telemetry pt in NSR:     PHYSICAL EXAM General: Well developed, well nourished, in no acute distress Head: Eyes PERRLA, No xanthomas.   Normal cephalic and atramatic  Lungs:  Clear bilaterally to auscultation and percussion. Heart:  HRRR S1 S2 . No JVD.  No abdominal bruits. No femoral bruits. Abdomen: Obese abdomen soft and non-tender  Msk:  Back normal, normal gait. Normal strength and tone for age. Right groin sore Extremities:   No clubbing, cyanosis or edema.  DP +2 Neuro: Alert and oriented X 3. Psych:  Flat affect, responds appropriately   LABS: Basic Metabolic Panel:  Recent Labs  10/24/13 0503 10/24/13 0509 10/24/13 0700 10/24/13 1238 10/25/13 0300  NA 138 138  --  140 137  K 3.5* 3.6*  --  3.1* 3.5*  CL 99 100  --   --  101  CO2 20  --   --   --  25  GLUCOSE 292* 300*  --   --  192*  BUN 6 3*  --   --  6  CREATININE 0.42* 0.40*  --   --  0.42*  CALCIUM 8.9  --   --   --  8.1*  MG  --   --  1.5  --   --    Liver Function Tests:  Recent Labs  10/24/13 0503  AST 15  ALT 18  ALKPHOS 120*  BILITOT 0.3  PROT 7.2  ALBUMIN 3.5   No results found for this basename: LIPASE,  AMYLASE,  in the last 72 hours CBC:  Recent Labs  10/24/13 1038 10/24/13 1238 10/25/13 0300  WBC 9.6  --  9.6  HGB 12.3 12.2 11.8*  HCT 34.3* 36.0 34.0*  MCV 77.6*  --  78.9  PLT 280  --  269   Cardiac Enzymes:  Recent Labs  10/24/13 0622 10/24/13 1038 10/24/13 1934 10/25/13 0300  CKTOTAL  --   --  1166* 724*  CKMB  --   --  107.5* 57.2*  TROPONINI 4.75* >20.00* 17.05*  --    BNP: No components found with this basename: POCBNP,  D-Dimer: No results found for this basename: DDIMER,  in the last 72 hours Hemoglobin A1C:  Recent Labs  10/24/13 0700  HGBA1C 10.9*   Fasting Lipid Panel:  Recent Labs  10/25/13 0300  CHOL 129  HDL 22*  LDLCALC 86  TRIG 107  CHOLHDL 5.9   Thyroid Function Tests: No results found for this  basename: TSH, T4TOTAL, FREET3, T3FREE, THYROIDAB,  in the last 72 hours Anemia Panel: No results found for this basename: VITAMINB12, FOLATE, FERRITIN, TIBC, IRON, RETICCTPCT,  in the last 72 hours Coag Panel:   Lab Results  Component Value Date   INR 1.13 10/25/2013   INR 1.03 10/24/2013   INR 1.02 11/22/2012    RADIOLOGY: Dg Chest Portable 1 View  10/24/2013   CLINICAL DATA:  Chest pain.  EXAM: PORTABLE CHEST - 1 VIEW  COMPARISON:  09/04/2013  FINDINGS: Low lung volumes. No confluent opacities or effusions. Heart is normal size. No acute bony abnormality.  IMPRESSION: No acute cardiopulmonary disease.   Electronically Signed   By: Kevin  Dover M.D.   On: 10/24/2013 05:10      ASSESSMENT/PLAN:    Maureen Price is a 37 y.o. female with a hx of obesity, uncontrolled DM, ongoing tobacco abuse and family hx of premature CAD, and no prior cardiac history who presented last night with anterior STEMI. She was rushed for emergent cath.  STEMI- s/p DES to LAD. Cath revealed LVEF ~45 % w/ WMA: moderate anteroapical hypokinesia. Large anterior myocardial infarction with an occluded proximal LAD and a filling defect in the proximal RCA. PCI and  stenting using drug-eluting stent to LAD, Angiomax, and Aggrastat post procedure.  No bleeding complications.  -- Troponin >20, CK-MB 107.5  -- No events on tele since a 7 beat run of VT the day of the MI. Continue to monitor, can transfer to tele.  -- Continue ASA/Brilinta, statin and BB   Hypokalemia- resolved HTN-stable  DM- uncontrolled. HgA1c 10.9. SSI per pharmacy  Tobacco abuse- patient education   Refused cath.  We discussed relook at the RCA and the rationale behind this.  She is going to walk around.  If she has any sx, she will agree to cath.  Otherwise, will plan on d/c with prolonged dual antiplatelet therapy.  Education for RF modification is key.  She has multiple items from the [employer] in her room.  Will see what she decides.   VARANASI,JAYADEEP S., MD  10/26/2013  10:09 AM  

## 2013-10-27 DIAGNOSIS — Z9861 Coronary angioplasty status: Secondary | ICD-10-CM

## 2013-10-27 LAB — GLUCOSE, CAPILLARY
GLUCOSE-CAPILLARY: 190 mg/dL — AB (ref 70–99)
Glucose-Capillary: 199 mg/dL — ABNORMAL HIGH (ref 70–99)

## 2013-10-27 MED ORDER — TICAGRELOR 90 MG PO TABS: 90.0000 mg | ORAL_TABLET | Freq: Two times a day (BID) | ORAL | Status: DC

## 2013-10-27 NOTE — Progress Notes (Signed)
Subjective: Denies further chest pain. No SOB. Right hand numbness has resolved. Has been ambulating w/o difficulty. She denies feeling dizzy or lightheaded. No syncope/ near syncope.   Objective: Vital signs in last 24 hours: Temp:  [98.2 F (36.8 C)-99.2 F (37.3 C)] 98.2 F (36.8 C) (02/20 0635) Pulse Rate:  [77-94] 77 (02/20 0635) Resp:  [16-18] 18 (02/19 2013) BP: (89-121)/(55-76) 89/56 mmHg (02/20 0635) SpO2:  [96 %-98 %] 97 % (02/20 0635) Last BM Date: 10/26/13  Intake/Output from previous day: 02/19 0701 - 02/20 0700 In: 160 [P.O.:160] Out: -  Intake/Output this shift:    Medications Current Facility-Administered Medications  Medication Dose Route Frequency Provider Last Rate Last Dose  . aspirin EC tablet 81 mg  81 mg Oral Daily Alwyn Pea, MD   81 mg at 10/26/13 1020  . atorvastatin (LIPITOR) tablet 80 mg  80 mg Oral q1800 Alwyn Pea, MD   80 mg at 10/26/13 1810  . HYDROcodone-acetaminophen (NORCO/VICODIN) 5-325 MG per tablet 1-2 tablet  1-2 tablet Oral Q4H PRN Lorretta Harp, MD   2 tablet at 10/27/13 0403  . insulin aspart (novoLOG) injection 0-15 Units  0-15 Units Subcutaneous TID WC Casandra Doffing, MD   5 Units at 10/26/13 [phone]  . insulin aspart (novoLOG) injection 0-5 Units  0-5 Units Subcutaneous QHS Alwyn Pea, MD   2 Units at 10/25/13 2142  . insulin detemir (LEVEMIR) injection 50 Units  50 Units Subcutaneous Daily Lorretta Harp, MD   50 Units at 10/26/13 1020  . levofloxacin (LEVAQUIN) tablet 500 mg  500 mg Oral Daily Lorretta Harp, MD   500 mg at 10/26/13 1020  . metoprolol tartrate (LOPRESSOR) tablet 12.5 mg  12.5 mg Oral BID Lorretta Harp, MD   12.5 mg at 10/26/13 2248  . morphine 2 MG/ML injection 2 mg  2 mg Intravenous Q1H PRN Lorretta Harp, MD   2 mg at 10/24/13 2140  . nitroGLYCERIN (NITROSTAT) SL tablet 0.4 mg  0.4 mg Sublingual Q5 Min x 3 PRN Alwyn Pea, MD      . Ticagrelor (BRILINTA) tablet 90 mg  90 mg  Oral BID Lorretta Harp, MD   90 mg at 10/26/13 2248    PE: General appearance: alert, cooperative and no distress Lungs: clear to auscultation bilaterally Heart: regular rate and rhythm, S1, S2 normal, no murmur, click, rub or gallop Extremities: no LEE Pulses: 2+ and symmetric Skin: warm and dry Neurologic: Grossly normal  Lab Results:   Recent Labs  10/24/13 1038 10/24/13 1238 10/25/13 0300 10/26/13 1417  WBC 9.6  --  9.6 8.6  HGB 12.3 12.2 11.8* 11.9*  HCT 34.3* 36.0 34.0* 33.6*  PLT 280  --  269 258   BMET  Recent Labs  10/24/13 1238 10/25/13 0300 10/26/13 1417  NA 140 137 142  K 3.1* 3.5* 3.8  CL  --  101 106  CO2  --  25 23  GLUCOSE  --  192* 123*  BUN  --  6 5*  CREATININE  --  0.42* 0.44*  CALCIUM  --  8.1* 8.6   PT/INR  Recent Labs  10/25/13 0300 10/26/13 1417  LABPROT 14.3 13.4  INR 1.13 1.04   Cholesterol  Recent Labs  10/25/13 0300  CHOL 129   Cardiac Panel (last 3 results)  Recent Labs  10/24/13 1038 10/24/13 1934 10/25/13 0300  CKTOTAL  --  1166* 724*  CKMB  --  107.5* 57.2*  TROPONINI >  20.00* 17.05*  --   RELINDX  --  9.2* 7.9*    Studies/Results:  Relook Cath 10/26/13 Impression:  1. Single vessel CAD with patent stent proximal LAD  2. Low normal LV systolic function  3. Resolution of thrombus seen in the proximal RCA on 10/24/13.  Recommendations: Continue medical therapy for CAD. Aggressive risk factor reduction.    Assessment/Plan  Principal Problem:   STEMI (ST elevation myocardial infarction), PTCA/Stent DES- Xience Alpine- LAD, large burden thrombus in LAD and RCA  Active Problems:   Type II or unspecified type diabetes mellitus without mention of complication, uncontrolled   Acute myocardial infarction of other anterior wall, initial episode of care   Paroxysmal ventricular tachycardia   Obesity   Dyslipidemia, goal LDL below 70  Plan: Relook cath yesterday revealed patent proximal LAD stent. The  thrombus seen in the proximal RCA during initial cath on 10/24/13 had resolved. Pt denies further CP/ SOB. Right radial access site is stable. Pt is hypotensive but asymptomatic. Most recent BP is 89/56. Hgb is stable at 11.9.  She has been ambulating and denies feeling lightheaded/ dizzy. No syncope or near syncope. ? If we should hold BB until patient is seen in f/u. Will plan for discharge today with close OP f/u. Continue DAPT with ASA + Brilinta, as well as statin. MD to make final decision regarding BB.    LOS: 3 days    Brittainy M. Ladoris Gene 10/27/2013 8:27 AM  I have examined the patient and reviewed assessment and plan and discussed with patient.  Agree with above as stated.  Continue low dose beta blocker.  Stressed importance of RF modification. Plan d/c today. Radial site is much better.  Tenlee Wollin S.

## 2013-11-02 ENCOUNTER — Ambulatory Visit: Payer: BC Managed Care – PPO | Admitting: Physician Assistant

## 2013-11-06 ENCOUNTER — Ambulatory Visit: Payer: Self-pay | Admitting: Physician Assistant

## 2013-11-06 ENCOUNTER — Encounter: Payer: Self-pay | Admitting: Physician Assistant

## 2013-11-06 ENCOUNTER — Ambulatory Visit (INDEPENDENT_AMBULATORY_CARE_PROVIDER_SITE_OTHER): Payer: BC Managed Care – PPO | Admitting: Physician Assistant

## 2013-11-06 VITALS — BP 110/70 | HR 97 | Ht 65.0 in | Wt 241.0 lb

## 2013-11-06 DIAGNOSIS — E669 Obesity, unspecified: Secondary | ICD-10-CM

## 2013-11-06 DIAGNOSIS — I4729 Other ventricular tachycardia: Secondary | ICD-10-CM

## 2013-11-06 DIAGNOSIS — E785 Hyperlipidemia, unspecified: Secondary | ICD-10-CM

## 2013-11-06 DIAGNOSIS — IMO0001 Reserved for inherently not codable concepts without codable children: Secondary | ICD-10-CM

## 2013-11-06 DIAGNOSIS — E1165 Type 2 diabetes mellitus with hyperglycemia: Secondary | ICD-10-CM

## 2013-11-06 DIAGNOSIS — I472 Ventricular tachycardia, unspecified: Secondary | ICD-10-CM

## 2013-11-06 DIAGNOSIS — I2510 Atherosclerotic heart disease of native coronary artery without angina pectoris: Secondary | ICD-10-CM

## 2013-11-06 NOTE — Progress Notes (Signed)
Date:  11/06/2013   ID:  Maureen Price, DOB 1976/03/19, MRN [id number]  PCP:  [name], MD  Primary Cardiologist:  Gwenlyn Found     History of Present Illness: Maureen Price is a 38 y.o. female who presented to the ER for evaluation of chest pain 10/24/13. She has hx of DM uncontrolled, but denies known CAD or prior MI. She woke up with chest pain around 430. Pain was severe, felt like bricks on her chest. She also had N/V x 2. She called EMS and was brought to ED. EMS sent ecg concerning for STEMI. She was given ASA and NTG by EMS. Still having 6/10 pain in the ER, IV heparin bolus given. She was taken to the cath lab by Dr. Gwenlyn Found with results:  ANGIOGRAPHIC RESULTS:  1. Left main; normal  2. LAD; occluded after the first diagonal branch  3. Left circumflex; normal.  4. Right coronary artery; dominant with a filling defect in the proximal portion  5. Left ventriculography; RAO left ventriculogram was performed using  25 mL of Visipaque dye at 12 mL/second. The overall LVEF estimated  45 % With wall motion abnormalities moderate anteroapical hypokinesia   She then underwent successful direct angioplasty of an occluded proximal LAD in the setting of an anterior STEMI with a drug-eluting stent. Because of the large thrombus burden in the LAD, as well as visible thrombus in the proximal RCA, the patient was treated with Aggrastat for 18 hours. A second cath to re-assess the thrombus burden in the RCA, access patency of LAD stent and assess LV function was scheduled.  By 10/26/13 she was stable and ambulating without problems and did decide to proceed with follow up cardiac cath which revealed:  Left Ventricular Angiogram: LVEF=50-55%  Impression:  1. Single vessel CAD with patent stent proximal LAD  2. Low normal LV systolic function  3. Resolution of thrombus seen in the proximal RCA on 10/24/13.  Recommendations: Continue medical therapy for CAD. Aggressive risk factor reduction    She will continue DAPT with ASA + Brilinta, as well as statin and low dose BB.   Patient presents today for posthospital evaluation. She is doing well. She's having approximately one episode of mild chest pressure daily. This resolves spontaneously without the use of nitroglycerin. It's a little short of breath. But otherwise denies nausea, vomiting, fever, chest pain, shortness of breath, orthopnea, dizziness, PND, cough, congestion, abdominal pain, hematochezia, melena, lower extremity edema, claudication.  She also reports mild tenderness in her right groin and in her right arm near the cath sites. She's been taking Tylenol which helps.  Wt Readings from Last 3 Encounters:  11/06/13 241 lb (109.317 kg)  10/25/13 253 lb 12 oz (115.1 kg)  10/25/13 253 lb 12 oz (115.1 kg)     Past Medical History  Diagnosis Date  . Diabetes mellitus     2002; no meds since 2004  . Acute myocardial infarction of other anterior wall, initial episode of care   . Paroxysmal ventricular tachycardia   . Obesity   . Dyslipidemia, goal LDL below 70 10/26/2013  . STEMI (ST elevation myocardial infarction)   . CAD (coronary artery disease)     Current Outpatient Prescriptions  Medication Sig Dispense Refill  . acetaminophen (TYLENOL) 325 MG tablet Take 2 tablets (650 mg total) by mouth every 4 (four) hours as needed for headache or mild pain.      Marland Kitchen aspirin EC 81 MG EC tablet Take 1  tablet (81 mg total) by mouth daily.      Marland Kitchen atorvastatin (LIPITOR) 80 MG tablet Take 1 tablet (80 mg total) by mouth daily at 6 PM.  30 tablet  6  . insulin detemir (LEVEMIR) 100 UNIT/ML injection Inject 0.5 mLs (50 Units total) into the skin daily.  10 mL  11  . metoprolol tartrate (LOPRESSOR) 25 MG tablet Take 0.5 tablets (12.5 mg total) by mouth 2 (two) times daily.  30 tablet  6  . nitroGLYCERIN (NITROSTAT) 0.4 MG SL tablet Place 1 tablet (0.4 mg total) under the tongue every 5 (five) minutes x 3 doses as needed for chest pain.   25 tablet  4  . SitaGLIPtin-MetFORMIN HCl (JANUMET XR) 50-1000 MG TB24 Take 2 tablets by mouth daily.  180 tablet  1  . Ticagrelor (BRILINTA) 90 MG TABS tablet Take 1 tablet (90 mg total) by mouth 2 (two) times daily.  60 tablet  0   No current facility-administered medications for this visit.    Allergies:    Allergies  Allergen Reactions  . Erythromycin Anaphylaxis  . Penicillins Anaphylaxis  . Viibryd [Vilazodone Hcl]     Altered mental status.      Social History:  The patient  reports that she has been smoking Cigarettes.  She has a 20 pack-year smoking history. She does not have any smokeless tobacco history on file. She reports that she drinks alcohol. She reports that she does not use illicit drugs.   Family history:   Family History  Problem Relation Age of Onset  . Arthritis Mother   . Diabetes Mother   . Heart disease Father   . Stroke Father   . Cancer Neg Hx   . Alcohol abuse Neg Hx   . Early death Neg Hx   . Kidney disease Neg Hx   . Hypertension Neg Hx   . Hyperlipidemia Neg Hx   . Diabetes Maternal Grandfather     ROS:  Please see the history of present illness.  All other systems reviewed and negative.   PHYSICAL EXAM: VS:  BP 110/70  Pulse 97  Ht 5\' 5"  (1.651 m)  Wt 241 lb (109.317 kg)  BMI 40.10 kg/m2 Obesity, well developed, in no acute distress HEENT: Pupils are equal round react to light accommodation extraocular movements are intact.  Neck: no JVDNo cervical lymphadenopathy. Cardiac: Regular rate and rhythm without murmurs rubs or gallops. Lungs:  clear to auscultation bilaterally, no wheezing, rhonchi or rales Abd: soft, nontender, positive bowel sounds all quadrants, no hepatosplenomegaly Ext: no lower extremity edema.  2+ radial and dorsalis pedis pulses. Skin: warm and dry,  Small hematoma without bruit in the right groin area. Small area of ecchymosis in the right forearm. Pulses 2+ Neuro:  Grossly normal  EKG:  Normal sinus rhythm. Rate  97 beats per minute. Septal T wave inversion    ASSESSMENT AND PLAN:  Problem List Items Addressed This Visit   Dyslipidemia, goal LDL below 70 (Chronic)     Continue Lipitor    Type II or unspecified type diabetes mellitus without mention of complication, uncontrolled   Paroxysmal ventricular tachycardia     She says she's having occasional episodes of fluttering may be in SVT. Given her recent hypotension I am not going to increase her Lopressor currently and may need to do this in the future.    Obesity     The patient will be referred for dietary consultation.  She apparently was well over  300 pounds in the past and has done well on her own.    Coronary artery disease     Recent STEMI (ST elevation myocardial infarction), PTCA/Stent DES- Xience Alpine- LAD, large burden thrombus in LAD and RCA.  A repeat catheterization showed resolution of thrombus. The patient is treated with aspirin and [place] and has not missed any doses. Approximately once per day she is having some mild chest pressure which will resolves after 5 minutes. She is on Lopressor 12.5 mg twice a day and her blood pressures are very well controlled however, she had periods of hypotension while she was hospitalized. Not making any changes at this time. She has continued episodes of chest pressure may need to consider adding Ranexa.     Other Visit Diagnoses   CAD (coronary artery disease)    -  Primary    Relevant Orders       EKG 12-Lead       AMB referral to cardiac rehabilitation

## 2013-11-06 NOTE — Assessment & Plan Note (Signed)
She says she's having occasional episodes of fluttering may be in SVT. Given her recent hypotension I am not going to increase her Lopressor currently and may need to do this in the future.

## 2013-11-06 NOTE — Assessment & Plan Note (Signed)
The patient will be referred for dietary consultation.  She apparently was well over 300 pounds in the past and has done well on her own.

## 2013-11-06 NOTE — Assessment & Plan Note (Signed)
-  Continue Lipitor °

## 2013-11-06 NOTE — Patient Instructions (Signed)
1.  Dietary referral. 2.  Follow up with Dr. Gwenlyn Found in one month. 3.  If you continue to to have chest pressure,  Call the office and we can try adding another medicine.

## 2013-11-06 NOTE — Assessment & Plan Note (Signed)
Recent STEMI (ST elevation myocardial infarction), PTCA/Stent DES- Xience Alpine- LAD, large burden thrombus in LAD and RCA.  A repeat catheterization showed resolution of thrombus. The patient is treated with aspirin and [place] and has not missed any doses. Approximately once per day she is having some mild chest pressure which will resolves after 5 minutes. She is on Lopressor 12.5 mg twice a day and her blood pressures are very well controlled however, she had periods of hypotension while she was hospitalized. Not making any changes at this time. She has continued episodes of chest pressure may need to consider adding Ranexa.

## 2013-11-14 ENCOUNTER — Telehealth: Payer: Self-pay | Admitting: *Deleted

## 2013-11-14 NOTE — Telephone Encounter (Signed)
Maureen Price presented her FMLA papers that Gaspar Bidding filled out.  She says that [employer] B[employer] said that she could go back to work for 1.2 hours per day.  She said that that is not worth her gas.    I reviewed the forms and I think that it is a misunderstanding about the 1.2 hours.  I believe that Gaspar Bidding was referring to her cardiac rehab.  I will verify with Dr Gwenlyn Found about when she can return to work.  I verified with Dr Gwenlyn Found as of 11/17/13. She may return to work one month after STEMI without restrictions.   I called and notified patient on 11/20/13.  I will mail updated FMLA papers to patient at her request.  She may return to work 11/27/13 without restrictions.

## 2013-11-24 ENCOUNTER — Telehealth: Payer: Self-pay | Admitting: Cardiovascular Disease

## 2013-11-24 NOTE — Telephone Encounter (Signed)
Needs a return to work note for Monday , will be able to pick it up today .Marland Kitchen Needs it to go back to work on Monday .Marland Kitchen Please call [phone]  Thanks

## 2013-11-24 NOTE — Telephone Encounter (Signed)
Returned call and pt verified x 2.  Pt informed message received.  Pt stated she talked w/ someone on Monday who told her she was mailing her FMLA papers, but she still hasn't gotten them.  Pt informed she should receive them today or tomorrow.  Pt stated she will just come in on Monday to get a letter if she doesn't get it this weekend.  Pt informed Curt Bears, Dr. Kennon Holter nurse, will be notified to see if she can generate a note in case she doesn't receive it.  Pt advised NOT to show up in office until she receives a call that the note is ready.  Pt verbalized understanding and agreed w/ plan.  Message forwarded to Curt Bears, [place].

## 2013-11-27 NOTE — Telephone Encounter (Signed)
Pt still have not received her FMLA  Papers,dhe need them so she can go back to work. She she can not work unless she have these papers.

## 2013-11-27 NOTE — Telephone Encounter (Signed)
Medical Records located forms.  Copy made and left at front desk for pt.   Returned call and pt verified x 2.  Pt informed message received and copy located and left at front desk.  Pt verbalized understanding and agreed w/ plan.  Pt will pick up today before 5 pm.

## 2013-11-27 NOTE — Telephone Encounter (Signed)
Forms have not been scanned.  Medical Records notified to locate copy.

## 2013-12-08 ENCOUNTER — Ambulatory Visit: Payer: Self-pay | Admitting: Cardiovascular Disease

## 2013-12-14 ENCOUNTER — Other Ambulatory Visit: Payer: Self-pay

## 2013-12-14 ENCOUNTER — Encounter (HOSPITAL_COMMUNITY): Payer: Self-pay | Admitting: Emergency Medicine

## 2013-12-14 ENCOUNTER — Inpatient Hospital Stay (HOSPITAL_COMMUNITY)
Admission: EM | Admit: 2013-12-14 | Discharge: 2013-12-15 | DRG: 303 | Disposition: A | Discharge status: Home / Self Care | Payer: BC Managed Care – PPO | Attending: Cardiovascular Disease | Admitting: Cardiovascular Disease

## 2013-12-14 ENCOUNTER — Ambulatory Visit (HOSPITAL_COMMUNITY): Payer: Self-pay

## 2013-12-14 ENCOUNTER — Emergency Department (HOSPITAL_COMMUNITY): Payer: BC Managed Care – PPO

## 2013-12-14 DIAGNOSIS — E1165 Type 2 diabetes mellitus with hyperglycemia: Secondary | ICD-10-CM

## 2013-12-14 DIAGNOSIS — R072 Precordial pain: Secondary | ICD-10-CM

## 2013-12-14 DIAGNOSIS — R079 Chest pain, unspecified: Secondary | ICD-10-CM

## 2013-12-14 DIAGNOSIS — Z823 Family history of stroke: Secondary | ICD-10-CM

## 2013-12-14 DIAGNOSIS — E785 Hyperlipidemia, unspecified: Secondary | ICD-10-CM

## 2013-12-14 DIAGNOSIS — Z794 Long term (current) use of insulin: Secondary | ICD-10-CM

## 2013-12-14 DIAGNOSIS — I213 ST elevation (STEMI) myocardial infarction of unspecified site: Secondary | ICD-10-CM

## 2013-12-14 DIAGNOSIS — Z6841 Body Mass Index (BMI) 40.0 and over, adult: Secondary | ICD-10-CM

## 2013-12-14 DIAGNOSIS — IMO0001 Reserved for inherently not codable concepts without codable children: Secondary | ICD-10-CM

## 2013-12-14 DIAGNOSIS — I252 Old myocardial infarction: Secondary | ICD-10-CM

## 2013-12-14 DIAGNOSIS — Z79899 Other long term (current) drug therapy: Secondary | ICD-10-CM

## 2013-12-14 DIAGNOSIS — Z7982 Long term (current) use of aspirin: Secondary | ICD-10-CM

## 2013-12-14 DIAGNOSIS — Z881 Allergy status to other antibiotic agents status: Secondary | ICD-10-CM

## 2013-12-14 DIAGNOSIS — I2510 Atherosclerotic heart disease of native coronary artery without angina pectoris: Principal | ICD-10-CM

## 2013-12-14 DIAGNOSIS — IMO0002 Reserved for concepts with insufficient information to code with codable children: Secondary | ICD-10-CM | POA: Diagnosis present

## 2013-12-14 DIAGNOSIS — F17200 Nicotine dependence, unspecified, uncomplicated: Secondary | ICD-10-CM | POA: Diagnosis present

## 2013-12-14 DIAGNOSIS — Z880 Allergy status to penicillin: Secondary | ICD-10-CM

## 2013-12-14 DIAGNOSIS — I70209 Unspecified atherosclerosis of native arteries of extremities, unspecified extremity: Secondary | ICD-10-CM

## 2013-12-14 DIAGNOSIS — E669 Obesity, unspecified: Secondary | ICD-10-CM

## 2013-12-14 DIAGNOSIS — Z888 Allergy status to other drugs, medicaments and biological substances status: Secondary | ICD-10-CM

## 2013-12-14 DIAGNOSIS — Z9861 Coronary angioplasty status: Secondary | ICD-10-CM

## 2013-12-14 DIAGNOSIS — E1151 Type 2 diabetes mellitus with diabetic peripheral angiopathy without gangrene: Secondary | ICD-10-CM | POA: Diagnosis present

## 2013-12-14 DIAGNOSIS — I200 Unstable angina: Secondary | ICD-10-CM

## 2013-12-14 DIAGNOSIS — I2109 ST elevation (STEMI) myocardial infarction involving other coronary artery of anterior wall: Secondary | ICD-10-CM

## 2013-12-14 DIAGNOSIS — Z8249 Family history of ischemic heart disease and other diseases of the circulatory system: Secondary | ICD-10-CM

## 2013-12-14 DIAGNOSIS — Z7901 Long term (current) use of anticoagulants: Secondary | ICD-10-CM

## 2013-12-14 DIAGNOSIS — Z833 Family history of diabetes mellitus: Secondary | ICD-10-CM

## 2013-12-14 HISTORY — DX: Unspecified osteoarthritis, unspecified site: M19.90

## 2013-12-14 LAB — I-STAT TROPONIN, ED: Troponin i, poc: 0.01 ng/mL (ref 0.00–0.08)

## 2013-12-14 LAB — CBC
HCT: 36.7 % (ref 36.0–46.0)
HEMOGLOBIN: 13.2 g/dL (ref 12.0–15.0)
MCH: 28 pg (ref 26.0–34.0)
MCHC: 36 g/dL (ref 30.0–36.0)
MCV: 77.8 fL — AB (ref 78.0–100.0)
Platelets: 253 10*3/uL (ref 150–400)
RBC: 4.72 MIL/uL (ref 3.87–5.11)
RDW: 13.8 % (ref 11.5–15.5)
WBC: 7.9 10*3/uL (ref 4.0–10.5)

## 2013-12-14 LAB — TROPONIN I: Troponin I: <0.3 ng/mL (ref <0.30)

## 2013-12-14 LAB — BASIC METABOLIC PANEL
BUN: 6 mg/dL (ref 6–23)
CHLORIDE: 101 meq/L (ref 96–112)
CO2: 21 mEq/L (ref 19–32)
CREATININE: 0.43 mg/dL — AB (ref 0.50–1.10)
Calcium: 8.9 mg/dL (ref 8.4–10.5)
GFR calc non Af Amer: >90 mL/min (ref >90)
GLUCOSE: 340 mg/dL — AB (ref 70–99)
POTASSIUM: 3.7 meq/L (ref 3.7–5.3)
Sodium: 137 mEq/L (ref 137–147)

## 2013-12-14 LAB — MRSA PCR SCREENING: MRSA by PCR: NEGATIVE

## 2013-12-14 LAB — GLUCOSE, CAPILLARY: Glucose-Capillary: 335 mg/dL — ABNORMAL HIGH (ref 70–99)

## 2013-12-14 MED ORDER — TICAGRELOR 90 MG PO TABS
90.0000 mg | ORAL_TABLET | Freq: Two times a day (BID) | ORAL | Status: DC
  Administered 2013-12-14: 21:00:00 90 mg via ORAL
  Filled 2013-12-14 (×3): qty 1

## 2013-12-14 MED ORDER — INSULIN ASPART 100 UNIT/ML ~~LOC~~ SOLN: 0.0000 [IU] | Freq: Three times a day (TID) | SUBCUTANEOUS | Status: DC

## 2013-12-14 MED ORDER — NITROGLYCERIN 0.4 MG SL SUBL: 0.4000 mg | SUBLINGUAL_TABLET | SUBLINGUAL | Status: DC | PRN

## 2013-12-14 MED ORDER — HEPARIN BOLUS VIA INFUSION
4000.0000 [IU] | Freq: Once | INTRAVENOUS | Status: AC
  Administered 2013-12-14: 4000 [IU] via INTRAVENOUS
  Filled 2013-12-14: qty 4000

## 2013-12-14 MED ORDER — ATORVASTATIN CALCIUM 80 MG PO TABS
80.0000 mg | ORAL_TABLET | Freq: Every day | ORAL | Status: DC
  Administered 2013-12-14: 80 mg via ORAL
  Filled 2013-12-14 (×2): qty 1

## 2013-12-14 MED ORDER — HEPARIN (PORCINE) IN NACL 100-0.45 UNIT/ML-% IJ SOLN
1500.0000 [IU]/h | INTRAMUSCULAR | Status: DC
  Administered 2013-12-14: 1150 [IU]/h via INTRAVENOUS
  Administered 2013-12-15: 1500 [IU]/h via INTRAVENOUS
  Filled 2013-12-14 (×3): qty 250

## 2013-12-14 MED ORDER — ASPIRIN EC 81 MG PO TBEC
81.0000 mg | DELAYED_RELEASE_TABLET | Freq: Every day | ORAL | Status: DC
  Administered 2013-12-15: 10:00:00 81 mg via ORAL
  Filled 2013-12-14: qty 1

## 2013-12-14 MED ORDER — SODIUM CHLORIDE 0.9 % IV BOLUS (SEPSIS)
500.0000 mL | Freq: Once | INTRAVENOUS | Status: AC
  Administered 2013-12-14: 500 mL via INTRAVENOUS

## 2013-12-14 MED ORDER — METOPROLOL TARTRATE 12.5 MG HALF TABLET
12.5000 mg | ORAL_TABLET | Freq: Two times a day (BID) | ORAL | Status: DC
  Administered 2013-12-14 – 2013-12-15 (×2): 12.5 mg via ORAL
  Filled 2013-12-14 (×3): qty 1

## 2013-12-14 MED ORDER — INSULIN DETEMIR 100 UNIT/ML ~~LOC~~ SOLN
50.0000 [IU] | Freq: Every day | SUBCUTANEOUS | Status: DC
  Administered 2013-12-15: 10:00:00 50 [IU] via SUBCUTANEOUS
  Filled 2013-12-14 (×2): qty 0.5

## 2013-12-14 MED ORDER — INSULIN ASPART 100 UNIT/ML ~~LOC~~ SOLN
0.0000 [IU] | Freq: Three times a day (TID) | SUBCUTANEOUS | Status: DC
  Administered 2013-12-14: 15 [IU] via SUBCUTANEOUS
  Administered 2013-12-15: 10:00:00 11 [IU] via SUBCUTANEOUS

## 2013-12-14 MED ORDER — NITROGLYCERIN 0.4 MG SL SUBL
0.4000 mg | SUBLINGUAL_TABLET | SUBLINGUAL | Status: DC | PRN
  Administered 2013-12-14: 0.4 mg via SUBLINGUAL

## 2013-12-14 MED ORDER — ASPIRIN 81 MG PO CHEW
324.0000 mg | CHEWABLE_TABLET | Freq: Once | ORAL | Status: AC
  Administered 2013-12-14: 324 mg via ORAL
  Filled 2013-12-14: qty 4

## 2013-12-14 NOTE — Plan of Care (Signed)
Problem: Consults Goal: Tobacco Cessation referral if indicated Outcome: Completed/Met Date Met:  12/14/13 Discussed smoking cessation, pt states she cut down from full to half a pack per day since heart attack 2 months ago.  Congratulated and encouraged pt to quit, discussed tips for success, education sheet given.

## 2013-12-14 NOTE — Progress Notes (Signed)
Pt received from ED, denies cp, amb in room, pleasant and cooperative.  Heparin infusing, assessment WNL, VSS.  POC reviewed, pt eager to go home tomorrow.  Tele SR 70-80's

## 2013-12-14 NOTE — ED Provider Notes (Signed)
CSN: [id number]     Arrival date & time 12/14/13  1258 History   First MD Initiated Contact with Patient 12/14/13 1310     Chief Complaint  Patient presents with  . Chest Pain     (Consider location/radiation/quality/duration/timing/severity/associated sxs/prior Treatment) Patient is a 38 y.o. female presenting with chest pain. The history is provided by the patient.  Chest Pain Pain location:  L chest Pain quality: pressure and tightness   Pain radiates to:  L shoulder Pain radiates to the back: no   Pain severity:  Moderate Onset quality:  Sudden Duration:  30 minutes Timing:  Constant Progression:  Partially resolved Chronicity:  New Context comment:  States had a stressful day today where she had a fight with daughter adn then her tire was flat and developed pain after that Relieved by:  Nitroglycerin Worsened by:  Nothing tried Ineffective treatments:  None tried Associated symptoms: shortness of breath   Associated symptoms: no abdominal pain, no diaphoresis, no nausea and no palpitations   Risk factors: coronary artery disease and diabetes mellitus   Risk factors comment:  S/p stent 2 months ago in the LAD   Past Medical History  Diagnosis Date  . Diabetes mellitus     2002; no meds since 2004  . Acute myocardial infarction of other anterior wall, initial episode of care   . Paroxysmal ventricular tachycardia   . Obesity   . Dyslipidemia, goal LDL below 70 10/26/2013  . STEMI (ST elevation myocardial infarction)   . CAD (coronary artery disease)    Past Surgical History  Procedure Laterality Date  . Cesarean section    . Gallbladder surgery    . Tubal ligation      2000  . Cesarean section  [id number]  . Coronary angioplasty with stent placement     Family History  Problem Relation Age of Onset  . Arthritis Mother   . Diabetes Mother   . Heart disease Father   . Stroke Father   . Cancer Neg Hx   . Alcohol abuse Neg Hx   . Early death Neg Hx   .  Kidney disease Neg Hx   . Hypertension Neg Hx   . Hyperlipidemia Neg Hx   . Diabetes Maternal Grandfather    History  Substance Use Topics  . Smoking status: Current Every Day Smoker -- 1.00 packs/day for 20 years    Types: Cigarettes  . Smokeless tobacco: Not on file  . Alcohol Use: 0.0 oz/week     Comment: occasional   OB History   Grav Para Term Preterm Abortions TAB SAB Ect Mult Living   4 3 3  1  1   3      Review of Systems  Constitutional: Negative for diaphoresis.  Respiratory: Positive for shortness of breath.   Cardiovascular: Positive for chest pain. Negative for palpitations.  Gastrointestinal: Negative for nausea and abdominal pain.  All other systems reviewed and are negative.     Allergies  Erythromycin; Penicillins; and Viibryd  Home Medications   Current Outpatient Rx  Name  Route  Sig  Dispense  Refill  . acetaminophen (TYLENOL) 325 MG tablet   Oral   Take 2 tablets (650 mg total) by mouth every 4 (four) hours as needed for headache or mild pain.         Marland Kitchen aspirin EC 81 MG EC tablet   Oral   Take 1 tablet (81 mg total) by mouth daily.         Marland Kitchen  atorvastatin (LIPITOR) 80 MG tablet   Oral   Take 1 tablet (80 mg total) by mouth daily at 6 PM.   30 tablet   6   . insulin detemir (LEVEMIR) 100 UNIT/ML injection   Subcutaneous   Inject 0.5 mLs (50 Units total) into the skin daily.   10 mL   11   . metoprolol tartrate (LOPRESSOR) 25 MG tablet   Oral   Take 0.5 tablets (12.5 mg total) by mouth 2 (two) times daily.   30 tablet   6   . nitroGLYCERIN (NITROSTAT) 0.4 MG SL tablet   Sublingual   Place 1 tablet (0.4 mg total) under the tongue every 5 (five) minutes x 3 doses as needed for chest pain.   25 tablet   4   . SitaGLIPtin-MetFORMIN HCl (JANUMET XR) 50-1000 MG TB24   Oral   Take 2 tablets by mouth daily.   180 tablet   1   . Ticagrelor (BRILINTA) 90 MG TABS tablet   Oral   Take 1 tablet (90 mg total) by mouth 2 (two) times  daily.   60 tablet   0    BP 124/86  Pulse 97  Temp(Src) 98.6 F (37 C) (Oral)  Resp 16  Ht 5\' 4"  (1.626 m)  Wt 240 lb (108.863 kg)  BMI 41.18 kg/m2  SpO2 97% Physical Exam  Nursing note and vitals reviewed. Constitutional: She is oriented to person, place, and time. She appears well-developed and well-nourished. No distress.  HENT:  Head: Normocephalic and atraumatic.  Mouth/Throat: Oropharynx is clear and moist.  Eyes: Conjunctivae and EOM are normal. Pupils are equal, round, and reactive to light.  Neck: Normal range of motion. Neck supple.  Cardiovascular: Normal rate, regular rhythm and intact distal pulses.   No murmur heard. Pulmonary/Chest: Effort normal and breath sounds normal. No respiratory distress. She has no wheezes. She has no rales. She exhibits no tenderness.  Pain not reproducible with arm movement or deep breathing  Abdominal: Soft. She exhibits no distension. There is no tenderness. There is no rebound and no guarding.  Musculoskeletal: Normal range of motion. She exhibits no edema and no tenderness.  Neurological: She is alert and oriented to person, place, and time.  Skin: Skin is warm and dry. No rash noted. No erythema.  Psychiatric: She has a normal mood and affect. Her behavior is normal.    ED Course  Procedures (including critical care time) Labs Review Labs Reviewed  CBC - Abnormal; Notable for the following:    MCV 77.8 (*)    All other components within normal limits  BASIC METABOLIC PANEL - Abnormal; Notable for the following:    Glucose, Bld 340 (*)    Creatinine, Ser 0.43 (*)    All other components within normal limits  I-STAT TROPOININ, ED   Imaging Review Dg Chest 2 View  12/14/2013   CLINICAL DATA:  Chest pain  EXAM: CHEST  2 VIEW  COMPARISON:  October 24, 2013  FINDINGS: There is no edema or consolidation. Heart size and pulmonary vascularity are normal. No pneumothorax. No adenopathy. No bone lesions. There is mild levoscoliosis  in the thoracic region.  IMPRESSION: No edema or consolidation.   Electronically Signed   By: Lowella Grip M.D.   On: 12/14/2013 13:45     Date: 12/14/2013  Rate: 99  Rhythm: normal sinus rhythm  QRS Axis: normal  Intervals: normal  ST/T Wave abnormalities: nonspecific T wave changes  Conduction Disutrbances:none  Narrative Interpretation:   Old EKG Reviewed: changes noted resolution of TWI of the anterior leads    MDM   Final diagnoses:  Chest pain    Patient presents with symptoms concerning for ACS with abrupt onset of chest pressure and shortness of breath after a stressful morning. She has a history of coronary artery disease status post stents of the LAD approximately 2 months ago. Patient states she ran out of her Brilinta, Lopressor, Lipitor 2 weeks ago and has not been able to get a hold of anyone to get refills. She has been taking an 81 mg aspirin daily and her blood sugar medication. She took one nitroglycerin today approximately 30 minutes ago with some improvement in symptoms. He pain is not reproducible in based on history and likely to be a PE. Patient is PERC negative.  CBC, BMP, troponin, chest x-ray pending. EKG is unchanged from prior. Patient given 325 of aspirin and nitroglycerin.  2:22 PM  Initial labs, imaging is within normal limits. Discussed with cardiology who will see the patient and that for further care.  Blanchie Dessert, MD 12/14/13 1431

## 2013-12-14 NOTE — H&P (Signed)
Cardiologist:  [name] is an 38 y.o. female.   Chief Complaint: Chest Pain  HPI:   Maureen Price is a 38 y.o. female with a history of CAD and recent STEMI on 10/24/13 due to occluded LAD after the first diag, DM- uncontrolled.  A DES was placed.   EF 50-55% by LV gram.  She was started on ASA and Brilinta.  She went for a follow up cath to assess RCA filling defect and this showed resolution.    She presents with 9/10 chest pain which felt similar to her previous encounter only it was not as severe.  It radiated to left shoulder and she was SOB.  She took one NTG SL and came right to the ER.  The pain decreased to 5/10 and she was given another NTG in the ER and pain is about a 3-4/10.  BP is soft.  She reports running out of Brilinta around March 20 and not having any refills.  She did not call the office to inquire.   The patient currently denies nausea, vomiting, fever,  orthopnea, dizziness, PND, cough, congestion, abdominal pain, hematochezia, melena, lower extremity edema, claudication.  She also reports she has been exercising daily after work at [employer] doing both cardio and [employer].  She was supposed to start cardiac rehab today.    Medications:   Prior to Admission medications   Medication Sig Start Date End Date Taking? Authorizing Provider  acetaminophen (TYLENOL) 500 MG tablet Take 1,000 mg by mouth every 6 (six) hours as needed for mild pain.   Yes Historical Provider, MD  aspirin EC 81 MG EC tablet Take 1 tablet (81 mg total) by mouth daily. 10/26/13  Yes Cecilie Kicks, NP  atorvastatin (LIPITOR) 80 MG tablet Take 1 tablet (80 mg total) by mouth daily at 6 PM. 10/26/13  Yes Cecilie Kicks, NP  insulin detemir (LEVEMIR) 100 UNIT/ML injection Inject 0.5 mLs (50 Units total) into the skin daily. 08/25/13  Yes [name], MD  metoprolol tartrate (LOPRESSOR) 25 MG tablet Take 0.5 tablets (12.5 mg total) by mouth 2 (two) times daily. 10/26/13  Yes Cecilie Kicks, NP  Multiple Vitamins-Minerals (CENTRUM ADULTS PO) Take 1 tablet by mouth daily.   Yes Historical Provider, MD  nitroGLYCERIN (NITROSTAT) 0.4 MG SL tablet Place 1 tablet (0.4 mg total) under the tongue every 5 (five) minutes x 3 doses as needed for chest pain. 10/26/13  Yes Cecilie Kicks, NP  SitaGLIPtin-MetFORMIN HCl (JANUMET XR) 50-1000 MG TB24 Take 2 tablets by mouth daily. 10/29/13  Yes Cecilie Kicks, NP  Ticagrelor (BRILINTA) 90 MG TABS tablet Take 1 tablet (90 mg total) by mouth 2 (two) times daily. 10/26/13  Yes Cecilie Kicks, NP         Past Medical History  Diagnosis Date  . Diabetes mellitus     2002; no meds since 2004  . Acute myocardial infarction of other anterior wall, initial episode of care   . Paroxysmal ventricular tachycardia   . Obesity   . Dyslipidemia, goal LDL below 70 10/26/2013  . STEMI (ST elevation myocardial infarction)   . CAD (coronary artery disease)     Past Surgical History  Procedure Laterality Date  . Cesarean section    . Gallbladder surgery    . Tubal ligation      2000  . Cesarean section  [id number]  . Coronary angioplasty with stent placement      Family History  Problem Relation  Age of Onset  . Arthritis Mother   . Diabetes Mother   . Heart disease Father   . Stroke Father   . Cancer Neg Hx   . Alcohol abuse Neg Hx   . Early death Neg Hx   . Kidney disease Neg Hx   . Hypertension Neg Hx   . Hyperlipidemia Neg Hx   . Diabetes Maternal Grandfather    Social History:  reports that she has been smoking Cigarettes.  She has a 20 pack-year smoking history. She does not have any smokeless tobacco history on file. She reports that she drinks alcohol. She reports that she does not use illicit drugs.  Allergies:  Allergies  Allergen Reactions  . Erythromycin Anaphylaxis  . Penicillins Anaphylaxis  . Viibryd [Vilazodone Hcl]     Altered mental status.       (Not in a hospital admission)  Results for orders placed during  the hospital encounter of 12/14/13 (from the past 48 hour(s))  CBC     Status: Abnormal   Collection Time    12/14/13  1:12 PM      Result Value Ref Range   WBC 7.9  4.0 - 10.5 K/uL   RBC 4.72  3.87 - 5.11 MIL/uL   Hemoglobin 13.2  12.0 - 15.0 g/dL   HCT 36.7  36.0 - 46.0 %   MCV 77.8 (*) 78.0 - 100.0 fL   MCH 28.0  26.0 - 34.0 pg   MCHC 36.0  30.0 - 36.0 g/dL   RDW 13.8  11.5 - 15.5 %   Platelets 253  150 - 400 K/uL  BASIC METABOLIC PANEL     Status: Abnormal   Collection Time    12/14/13  1:12 PM      Result Value Ref Range   Sodium 137  137 - 147 mEq/L   Potassium 3.7  3.7 - 5.3 mEq/L   Chloride 101  96 - 112 mEq/L   CO2 21  19 - 32 mEq/L   Glucose, Bld 340 (*) 70 - 99 mg/dL   BUN 6  6 - 23 mg/dL   Creatinine, Ser 0.43 (*) 0.50 - 1.10 mg/dL   Calcium 8.9  8.4 - 10.5 mg/dL   GFR calc non Af Amer >90  >90 mL/min   GFR calc Af Amer >90  >90 mL/min   Comment: (NOTE)     The eGFR has been calculated using the CKD EPI equation.     This calculation has not been validated in all clinical situations.     eGFR's persistently <90 mL/min signify possible Chronic Kidney     Disease.  Randolm Idol, ED     Status: None   Collection Time    12/14/13  1:37 PM      Result Value Ref Range   Troponin i, poc 0.01  0.00 - 0.08 ng/mL   Comment 3            Comment: Due to the release kinetics of cTnI,     a negative result within the first hours     of the onset of symptoms does not rule out     myocardial infarction with certainty.     If myocardial infarction is still suspected,     repeat the test at appropriate intervals.   Dg Chest 2 View  12/14/2013   CLINICAL DATA:  Chest pain  EXAM: CHEST  2 VIEW  COMPARISON:  October 24, 2013  FINDINGS: There is no  edema or consolidation. Heart size and pulmonary vascularity are normal. No pneumothorax. No adenopathy. No bone lesions. There is mild levoscoliosis in the thoracic region.  IMPRESSION: No edema or consolidation.   Electronically  Signed   By: Lowella Grip M.D.   On: 12/14/2013 13:45    Review of Systems  Constitutional: Negative for diaphoresis.  HENT: Negative for congestion and sore throat.   Respiratory: Positive for shortness of breath. Negative for cough and wheezing.   Cardiovascular: Positive for chest pain. Negative for orthopnea, leg swelling and PND.  Gastrointestinal: Negative for nausea, vomiting, abdominal pain, blood in stool and melena.  Genitourinary: Negative for hematuria.  Musculoskeletal: Positive for myalgias (Radiation of pain to her left shoulder.).  Neurological: Negative for dizziness.  All other systems reviewed and are negative.   Blood pressure 95/53, pulse 81, temperature 98.6 F (37 C), temperature source Oral, resp. rate 22, height _0  (1.626 m), weight 240 lb (108.863 kg), SpO2 98.00%. Physical Exam  Nursing note and vitals reviewed. Constitutional: She is oriented to person, place, and time. She appears well-developed. No distress.  Obese  HENT:  Head: Normocephalic and atraumatic.  Mouth/Throat: Oropharynx is clear and moist. No oropharyngeal exudate.  Eyes: EOM are normal. Pupils are equal, round, and reactive to light. No scleral icterus.  Neck: Normal range of motion. Neck supple.  Cardiovascular: Normal rate, regular rhythm, S1 normal and S2 normal.   No murmur heard. Pulses:      Radial pulses are 2+ on the right side, and 2+ on the left side.       Dorsalis pedis pulses are 2+ on the right side, and 2+ on the left side.  Respiratory: Effort normal and breath sounds normal. She has no wheezes. She has no rales.  GI: Soft. Bowel sounds are normal. She exhibits no distension. There is no tenderness.  Musculoskeletal: She exhibits no edema.  Lymphadenopathy:    She has no cervical adenopathy.  Neurological: She is alert and oriented to person, place, and time. She exhibits normal muscle tone.  Skin: Skin is warm and dry.  Psychiatric: She has a normal mood and  affect.     Assessment/Plan  Principal Problem:   Unstable angina Recent DES to the LAD on 10/24/13.  She only took Brilinta for 30 days then ran out.  Symptoms are concerning and worrisome for angina and the possibility of stent thrombosis.  I will start her on IV heparin per pharmacy.  Cycle troponin.  EKG:  Inferior TWI looks worse than prior.  ASA, BB get back on Brilinta.  Active Problems:   Coronary artery disease: See above    Type II or unspecified type diabetes mellitus without mention of complication, uncontrolled  SS insulin and CBG ACHS.  Hold oral meds   Obesity    I offered OP nutrition referral during our office visit in March.    Tarri Fuller 12/14/2013, 3:28 PM   Patient seen and examined and history reviewed. Agree with above findings and plan. 38 yo BF with history noted above. Recent STEMI with stenting of the LAD in February. Stopped taking Brilinta after one month. Now with recurrent pain radiating to her left arm. Different than her pain with MI. 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 Her exam demonstrates pain on palpation in the left pectoral region and toward her shoulder. Cardiac enzymes are negative so far. Ecg is without acute change. We will watch tonight on observation. Give analgesic  for musculoskeletal pain. Resume Brilinta- stressed the importance of not running out of this medication. If enzymes are negative would consider DC in am.  Ander Slade JordanMD 12/14/2013 4:22 PM

## 2013-12-14 NOTE — ED Notes (Signed)
Report attempted x 1

## 2013-12-14 NOTE — ED Notes (Signed)
Heart healthy diet ordered for patient.  

## 2013-12-14 NOTE — Progress Notes (Signed)
ANTICOAGULATION CONSULT NOTE - Initial Consult  Pharmacy Consult for Heparin Indication: chest pain/ACS  Allergies  Allergen Reactions  . Erythromycin Anaphylaxis  . Penicillins Anaphylaxis  . Viibryd [Vilazodone Hcl]     Altered mental status.      Patient Measurements: Height: 5\' 4"  (162.6 cm) Weight: 240 lb (108.863 kg) IBW/kg (Calculated) : 54.7 Heparin Dosing Weight: 80 kg  Vital Signs: Temp: 98.6 F (37 C) (04/09 1309) Temp src: Oral (04/09 1309) BP: 95/53 mmHg (04/09 1500) Pulse Rate: 81 (04/09 1500)  Labs:  Recent Labs  12/14/13 1312  HGB 13.2  HCT 36.7  PLT 253  CREATININE 0.43*    Estimated Creatinine Clearance: 116.1 ml/min (by C-G formula based on Cr of 0.43).   Medical History: Past Medical History  Diagnosis Date  . Diabetes mellitus     2002; no meds since 2004  . Acute myocardial infarction of other anterior wall, initial episode of care   . Paroxysmal ventricular tachycardia   . Obesity   . Dyslipidemia, goal LDL below 70 10/26/2013  . STEMI (ST elevation myocardial infarction)   . CAD (coronary artery disease)     Medications:  See electronic med rec  Assessment: 38 y.o. female presents with chest pain. S/p recent STEMI 10/24/13 with DES to LAD. Noted pt only took Brilinta for 30 days then ran out. To begin IV heparin for r/o ACS, possible stent thrombosis. CBC stable at baseline.   Goal of Therapy:  Heparin level 0.3-0.7 units/ml Monitor platelets by anticoagulation protocol: Yes   Plan:  1. Heparin IV bolus 4000 units 2. Heparin gtt at 1150 units/hr 3. Will f/u 6 hr heparin level  4. Daily heparin level and CBC  Sherlon Handing, PharmD, BCPS Clinical pharmacist, pager [phone] 12/14/2013,4:04 PM

## 2013-12-14 NOTE — ED Notes (Signed)
She c/o Cp onset 30 minutes ago, decreased by nitro, states "feels like when i had my heart attack in February." she denies nausea but has felt sob

## 2013-12-15 DIAGNOSIS — R072 Precordial pain: Secondary | ICD-10-CM | POA: Diagnosis present

## 2013-12-15 DIAGNOSIS — I219 Acute myocardial infarction, unspecified: Secondary | ICD-10-CM

## 2013-12-15 DIAGNOSIS — I2109 ST elevation (STEMI) myocardial infarction involving other coronary artery of anterior wall: Secondary | ICD-10-CM

## 2013-12-15 DIAGNOSIS — I200 Unstable angina: Secondary | ICD-10-CM

## 2013-12-15 LAB — CBC
HCT: 36.9 % (ref 36.0–46.0)
Hemoglobin: 13.1 g/dL (ref 12.0–15.0)
MCH: 27.6 pg (ref 26.0–34.0)
MCHC: 35.5 g/dL (ref 30.0–36.0)
MCV: 77.8 fL — AB (ref 78.0–100.0)
Platelets: 249 10*3/uL (ref 150–400)
RBC: 4.74 MIL/uL (ref 3.87–5.11)
RDW: 13.9 % (ref 11.5–15.5)
WBC: 9.3 10*3/uL (ref 4.0–10.5)

## 2013-12-15 LAB — TROPONIN I: Troponin I: <0.3 ng/mL (ref <0.30)

## 2013-12-15 LAB — GLUCOSE, CAPILLARY: Glucose-Capillary: 300 mg/dL — ABNORMAL HIGH (ref 70–99)

## 2013-12-15 LAB — HEPARIN LEVEL (UNFRACTIONATED): Heparin Unfractionated: <0.1 IU/mL — ABNORMAL LOW (ref 0.30–0.70)

## 2013-12-15 MED ORDER — TICAGRELOR 90 MG PO TABS
90.0000 mg | ORAL_TABLET | Freq: Two times a day (BID) | ORAL | Status: DC
  Administered 2013-12-15: 90 mg via ORAL
  Filled 2013-12-15 (×2): qty 1

## 2013-12-15 MED ORDER — ACTIVE PARTNERSHIP FOR HEALTH OF YOUR HEART BOOK
Freq: Once | Status: DC
  Filled 2013-12-15: qty 1

## 2013-12-15 MED ORDER — CLOPIDOGREL BISULFATE 75 MG PO TABS: 300.0000 mg | ORAL_TABLET | Freq: Once | ORAL | Status: DC

## 2013-12-15 MED ORDER — HEPARIN BOLUS VIA INFUSION
3000.0000 [IU] | Freq: Once | INTRAVENOUS | Status: AC
  Administered 2013-12-15: 02:00:00 3000 [IU] via INTRAVENOUS
  Filled 2013-12-15: qty 3000

## 2013-12-15 MED ORDER — TICAGRELOR 90 MG PO TABS: 90.0000 mg | ORAL_TABLET | Freq: Two times a day (BID) | ORAL | Status: DC

## 2013-12-15 MED ORDER — ACETAMINOPHEN 325 MG PO TABS
650.0000 mg | ORAL_TABLET | ORAL | Status: DC | PRN
  Administered 2013-12-15: 650 mg via ORAL

## 2013-12-15 NOTE — Progress Notes (Signed)
Agree with note by Rosaria Ferries PA-C  Pt with non cardiac CP. OK for DC home  Lorretta Harp, M.D., [place], [facility], [place], [place] [address]. [place], [place]  [address]  [phone] 12/15/2013 7:40 PM

## 2013-12-15 NOTE — Progress Notes (Signed)
ANTICOAGULATION CONSULT NOTE - Follow Up Consult  Pharmacy Consult for heparin  Indication: chest pain/ACS  Allergies  Allergen Reactions  . Erythromycin Anaphylaxis  . Penicillins Anaphylaxis  . Viibryd [Vilazodone Hcl]     Altered mental status.      Patient Measurements: Height: 5\' 4"  (162.6 cm) Weight: 239 lb 15.9 oz (108.86 kg) IBW/kg (Calculated) : 54.7 Heparin Dosing Weight:   Vital Signs: Temp: 98.7 F (37.1 C) (04/09 2351) Temp src: Oral (04/09 2351) BP: 114/83 mmHg (04/09 2351) Pulse Rate: 87 (04/09 2351)  Labs:  Recent Labs  12/14/13 1312 12/14/13 2210 12/15/13 0005  HGB 13.2  --   --   HCT 36.7  --   --   PLT 253  --   --   HEPARINUNFRC  --   --  <0.10*  CREATININE 0.43*  --   --   TROPONINI  --  <0.30  --     Estimated Creatinine Clearance: 116.1 ml/min (by C-G formula based on Cr of 0.43).   Assessment: heparin level is undetectable at 1150/hr after 4000 unit bolus. Plan for cath today most likely.  Goal of therapy  Heparin 0.3-0.7    Plan:  rebolus 3000 units and increase to 15000 units/hr recheck in 6 hours if not off for catherization.   Curlene Dolphin 12/15/2013,1:58 AM

## 2013-12-15 NOTE — Progress Notes (Signed)
CARDIAC REHAB PHASE I   PRE:  Rate/Rhythm: 81 SR  BP:  Supine: 100/55  Sitting:   Standing:    SaO2:   MODE:  Ambulation: 1000 ft   POST:  Rate/Rhythm: 92 SR  BP:  Supine:   Sitting: 127/66  Standing:    SaO2:  0850-0915 Pt tolerated ambulation well without c/o of cp or SOB. VS stable. Pt to side of bed after walk. I stressed medication compliance with her and that she has to stay on top of getting her medications refilled and if she needs refills  And her pharmacy is unable to get the medication she needs to call MD's office. Encouraged her to get in Cardiac Rehab Program, I will let staff know of her admission.   Rodney Langton RN 12/15/2013 9:16 AM

## 2013-12-15 NOTE — Progress Notes (Signed)
Patient Name: Maureen Price Date of Encounter: 12/15/2013  Principal Problem:   Unstable angina Active Problems:   Type II or unspecified type diabetes mellitus without mention of complication, uncontrolled   Obesity   Coronary artery disease    Patient Profile: 38 yo STEMI on 10/24/13 due to occluded LAD after the first diag, DM- uncontrolled, EF 55%, d/c'd Brilinta after 30 day had chest pain and was admitted.  SUBJECTIVE: Chest pain has improved. No longer tender   OBJECTIVE Filed Vitals:   12/14/13 2008 12/14/13 2149 12/14/13 2351 12/15/13 0448  BP: 126/81  114/83 119/67  Pulse: 87  87 80  Temp: 98.8 F (37.1 C)  98.7 F (37.1 C) 98.2 F (36.8 C)  TempSrc: Oral  Oral Oral  Resp: 24  18 18   Height:  5\' 4"  (1.626 m)    Weight:  239 lb 15.9 oz (108.86 kg) 239 lb 15.9 oz (108.86 kg)   SpO2: 97%  93% 96%    Intake/Output Summary (Last 24 hours) at 12/15/13 0759 Last data filed at 12/15/13 0515  Gross per 24 hour  Intake    610 ml  Output   1950 ml  Net  -1340 ml   Filed Weights   12/14/13 1309 12/14/13 2149 12/14/13 2351  Weight: 240 lb (108.863 kg) 239 lb 15.9 oz (108.86 kg) 239 lb 15.9 oz (108.86 kg)    PHYSICAL EXAM General: Well developed, well nourished, female in no acute distress. Head: Normocephalic, atraumatic.  Neck: Supple without bruits, JVD not elevated. Lungs:  Resp regular and unlabored, CTA. Heart: RRR, S1, S2, no S3, S4, or murmur; no rub. Abdomen: Soft, non-tender, non-distended, BS + x 4.  Extremities: No clubbing, cyanosis, no edema.  Neuro: Alert and oriented X 3. Moves all extremities spontaneously. Psych: Normal affect.  LABS: CBC: Recent Labs  12/14/13 1312 12/15/13 0415  WBC 7.9 9.3  HGB 13.2 13.1  HCT 36.7 36.9  MCV 77.8* 77.8*  PLT 253 366   Basic Metabolic Panel: Recent Labs  12/14/13 1312  NA 137  K 3.7  CL 101  CO2 21  GLUCOSE 340*  BUN 6  CREATININE 0.43*  CALCIUM 8.9   Cardiac Enzymes: Recent  Labs  12/14/13 2210 12/15/13 0415  TROPONINI <0.30 <0.30    Recent Labs  12/14/13 1337  TROPIPOC 0.01   TELE:   SR     Radiology/Studies: Dg Chest 2 View 12/14/2013   CLINICAL DATA:  Chest pain  EXAM: CHEST  2 VIEW  COMPARISON:  October 24, 2013  FINDINGS: There is no edema or consolidation. Heart size and pulmonary vascularity are normal. No pneumothorax. No adenopathy. No bone lesions. There is mild levoscoliosis in the thoracic region.  IMPRESSION: No edema or consolidation.   Electronically Signed   By: Lowella Grip M.D.   On: 12/14/2013 13:45     Current Medications:  . active partnership for health of your heart book   Does not apply Once  . aspirin EC  81 mg Oral Daily  . atorvastatin  80 mg Oral q1800  . insulin aspart  0-20 Units Subcutaneous TID WC  . insulin detemir  50 Units Subcutaneous Daily  . metoprolol tartrate  12.5 mg Oral BID  . Ticagrelor  90 mg Oral BID   . heparin 1,500 Units/hr (12/15/13 0603)    ASSESSMENT AND PLAN: Principal Problem:   Unstable angina - ez negative for MI, chest wall no longer tender. No cath indicated. Ok  to eat. Possible d/c.  Otherwise, continue current Rx. Active Problems:   Type II or unspecified type diabetes mellitus without mention of complication, uncontrolled   Obesity   Coronary artery disease   Signed, Lonn Georgia , PA-C 7:59 AM 12/15/2013

## 2013-12-15 NOTE — Discharge Summary (Signed)
CARDIOLOGY DISCHARGE SUMMARY   Patient ID: Maureen Price MRN: [id number] DOB/AGE: [birth year] 38 y.o.  Admit date: 12/14/2013 Discharge date: 12/15/2013  PCP: [name], MD Primary Cardiologist: Dr. Gwenlyn Found  Primary Discharge Diagnosis:  Precordial pain Secondary Discharge Diagnosis:    Type II or unspecified type diabetes mellitus without mention of complication, uncontrolled   Obesity   Coronary artery disease   Unstable angina  Procedures:  2 view CXR  Hospital Course: Maureen Price is a 38 y.o. female with a recent history of CAD. She had an MI in Feb 2015 with DES LAD and relook cath prior to d/c. She had chest pain similar to her MI symptoms after exertion and was admitted for further evaluation and treatment.   She was pain-free on ASA, Heparin, BB, statin and PRN nitrates. Her cardiac enzymes were negative for MI. Her chest wall had some tenderness and this improved. Her labs were reviewed and showed no significant abnormalities except poorly controlled blood sugar. Her CXR showed no acute disease and her ECG had no acute ischemic changes. She had gone off her Brilinta after 30 days and this was restarted. Smoking cessation was discussed and encouraged.  On 04/10, she was seen by Dr. Gwenlyn Found and all data were reviewed. She was ambulating without chest pain or SOB. No further cardiac workup was indicated and she is considered stable for discharge, to follow up as an outpatient.   Labs:  Lab Results  Component Value Date   WBC 9.3 12/15/2013   HGB 13.1 12/15/2013   HCT 36.9 12/15/2013   MCV 77.8* 12/15/2013   PLT 249 12/15/2013     Recent Labs Lab 12/14/13 1312  NA 137  K 3.7  CL 101  CO2 21  BUN 6  CREATININE 0.43*  CALCIUM 8.9  GLUCOSE 340*    Recent Labs  12/14/13 2210 12/15/13 0415  TROPONINI <0.30 <0.30     Radiology: Dg Chest 2 View 12/14/2013   CLINICAL DATA:  Chest pain  EXAM: CHEST  2 VIEW  COMPARISON:  October 24, 2013  FINDINGS: There  is no edema or consolidation. Heart size and pulmonary vascularity are normal. No pneumothorax. No adenopathy. No bone lesions. There is mild levoscoliosis in the thoracic region.  IMPRESSION: No edema or consolidation.   Electronically Signed   By: Lowella Grip M.D.   On: 12/14/2013 13:45   EKG: SR, T wave changes consistent w/ previous Anterior MI  FOLLOW UP PLANS AND APPOINTMENTS Allergies  Allergen Reactions  . Erythromycin Anaphylaxis  . Penicillins Anaphylaxis  . Viibryd [Vilazodone Hcl]     Altered mental status.       Medication List         acetaminophen 500 MG tablet  Commonly known as:  TYLENOL  Take 1,000 mg by mouth every 6 (six) hours as needed for mild pain.     aspirin 81 MG EC tablet  Take 1 tablet (81 mg total) by mouth daily.     atorvastatin 80 MG tablet  Commonly known as:  LIPITOR  Take 1 tablet (80 mg total) by mouth daily at 6 PM.     CENTRUM ADULTS PO  Take 1 tablet by mouth daily.     insulin detemir 100 UNIT/ML injection  Commonly known as:  LEVEMIR  Inject 0.5 mLs (50 Units total) into the skin daily.     metoprolol tartrate 25 MG tablet  Commonly known as:  LOPRESSOR  Take 0.5 tablets (12.5  mg total) by mouth 2 (two) times daily.     nitroGLYCERIN 0.4 MG SL tablet  Commonly known as:  NITROSTAT  Place 1 tablet (0.4 mg total) under the tongue every 5 (five) minutes x 3 doses as needed for chest pain.     SitaGLIPtin-MetFORMIN HCl 50-1000 MG Tb24  Commonly known as:  JANUMET XR  Take 2 tablets by mouth daily.     Ticagrelor 90 MG Tabs tablet  Commonly known as:  BRILINTA  Take 1 tablet (90 mg total) by mouth 2 (two) times daily.        Discharge Orders   Future Appointments Provider Department Dept Phone   12/18/2013 9:45 AM [facility] 6 [place] [phone]   12/20/2013 9:45 AM [facility] 6 [place] [phone]   12/22/2013 9:45 AM [facility]  Monitor 6 [place] [phone]   12/25/2013 9:45 AM [facility] Monitor [place] [phone]   12/27/2013 9:45 AM [facility] Monitor [place] [phone]   12/29/2013 9:45 AM [facility] Monitor [place] [phone]   01/01/2014 9:45 AM [facility] Monitor [place] [phone]   01/03/2014 9:45 AM [facility] Monitor 6 [place] [phone]   01/05/2014 9:45 AM [facility] Monitor 6 [place] [phone]   01/08/2014 9:45 AM [facility] Monitor [place] [phone]   01/09/2014 9:15 AM Lorretta Harp, MD [facility] [phone]   01/10/2014 9:45 AM [facility] Monitor 6 [place] [phone]   01/12/2014 9:45 AM [facility] Monitor [place] [phone]   01/15/2014 9:45 AM Mc-[facility] Monitor [place] [phone]   01/17/2014 9:45 AM [facility] Monitor [place] [phone]   01/19/2014 9:45 AM Mc-[facility] Monitor [place] [phone]   01/22/2014 9:45 AM Mc[facility] Monitor [place] [phone]   01/24/2014 9:45 AM Mc[facility]2 Monitor [address] [phone]   01/26/2014 9:45 AM [facility] Monitor [place] [phone]   01/29/2014 9:45 AM [facility] Monitor [place] [phone]   01/31/2014 9:45 AM Mc[facility] Monitor [place] [phone]   02/02/2014 9:45 AM [facility] Monitor [place] [phone]   02/05/2014 9:45 AM Mc[facility] Monitor [place] [phone]   02/07/2014 9:45 AM Mc[facility] Monitor [place] [phone]   02/09/2014 9:45 AM Mc[facility] Monitor [place] [phone]   02/12/2014 9:45 AM [facility] Monitor [place] [phone]   02/14/2014 9:45 AM [facility] Monitor [place] [phone]   02/16/2014 9:45 AM Mc-Phase2 Monitor [place] [phone]   02/19/2014 9:45 AM Mc-[facility]2 Monitor [place] [phone]   02/21/2014 9:45 AM Mc-Phase2 Monitor [place] [phone]   02/23/2014 9:45 AM Mc-Phase2 Monitor [place] [phone]   02/26/2014 9:45 AM Mc-Phase2 Monitor [place] [phone]   02/28/2014 9:45 AM Mc-Phase2 Monitor  [place]   03/02/2014 9:45 AM Mc-Phase2 Monitor 6 [place] [phone]   03/05/2014 9:45 AM Mc-Phase2 Monitor 6 [place] [phone]   03/07/2014 9:45 AM Mc-Phase2 Monitor 6 [place] [phone]   03/09/2014 9:45 AM Mc-Phase2 Monitor [place] [phone]   03/12/2014 9:45 AM Mc-Phase2 Monitor [place] [phone]   03/14/2014 9:45 AM Mc-Phase2 Monitor [place] [phone]   03/16/2014 9:45 AM Mc-Phase2 Monitor [place] [phone]   03/19/2014 9:45 AM Mc-Phase2 Monitor [place] [facility] [phone]   03/21/2014 9:45 AM Mc-Phase2 Monitor [place] [phone]   03/23/2014 9:45 AM Mc-Phase2 Monitor [place] CARDIAC  [facility] [place] [phone]   Future Orders Complete By Expires   Diet - low sodium heart healthy  As directed    Diet Carb Modified  As directed    Increase activity slowly  As directed      Follow-up Information   Follow up with Lorretta Harp, MD On 01/09/2014. (at 9:15 am)    Specialty:  Cardiology   Contact information:   [address] [place] [address] [place] [address] [phone]       BRING ALL MEDICATIONS WITH YOU TO FOLLOW UP APPOINTMENTS  Time spent with patient to include physician time: 42 min Signed: Lonn Georgia, PA-C 12/15/2013, 11:50 AM Co-Sign MD

## 2013-12-15 NOTE — Progress Notes (Signed)
Subjective:  No CP/SOB  Objective:  Temp:  [98.2 F (36.8 C)-98.8 F (37.1 C)] 98.2 F (36.8 C) (04/10 0448) Pulse Rate:  [79-97] 80 (04/10 0448) Resp:  [15-28] 18 (04/10 0448) BP: (90-126)/(41-86) 119/67 mmHg (04/10 0448) SpO2:  [92 %-100 %] 96 % (04/10 0448) Weight:  [239 lb 15.9 oz (108.86 kg)-240 lb (108.863 kg)] 239 lb 15.9 oz (108.86 kg) (04/09 2351) Weight change:   Intake/Output from previous day: 04/09 0701 - 04/10 0700 In: 610 [I.V.:110; IV Piggyback:500] Out: 1950 [Urine:1950]  Intake/Output from this shift:    Physical Exam: General appearance: alert and no distress Neck: no adenopathy, no carotid bruit, no JVD, supple, symmetrical, trachea midline and thyroid not enlarged, symmetric, no tenderness/mass/nodules Lungs: clear to auscultation bilaterally Heart: regular rate and rhythm, S1, S2 normal, no murmur, click, rub or gallop Extremities: extremities normal, atraumatic, no cyanosis or edema  Lab Results: Results for orders placed during the hospital encounter of 12/14/13 (from the past 48 hour(s))  CBC     Status: Abnormal   Collection Time    12/14/13  1:12 PM      Result Value Ref Range   WBC 7.9  4.0 - 10.5 K/uL   RBC 4.72  3.87 - 5.11 MIL/uL   Hemoglobin 13.2  12.0 - 15.0 g/dL   HCT 36.7  36.0 - 46.0 %   MCV 77.8 (*) 78.0 - 100.0 fL   MCH 28.0  26.0 - 34.0 pg   MCHC 36.0  30.0 - 36.0 g/dL   RDW 13.8  11.5 - 15.5 %   Platelets 253  150 - 400 K/uL  BASIC METABOLIC PANEL     Status: Abnormal   Collection Time    12/14/13  1:12 PM      Result Value Ref Range   Sodium 137  137 - 147 mEq/L   Potassium 3.7  3.7 - 5.3 mEq/L   Chloride 101  96 - 112 mEq/L   CO2 21  19 - 32 mEq/L   Glucose, Bld 340 (*) 70 - 99 mg/dL   BUN 6  6 - 23 mg/dL   Creatinine, Ser 0.43 (*) 0.50 - 1.10 mg/dL   Calcium 8.9  8.4 - 10.5 mg/dL   GFR calc non Af Amer >90  >90 mL/min   GFR calc Af Amer >90  >90 mL/min   Comment: (NOTE)     The eGFR has been calculated  using the CKD EPI equation.     This calculation has not been validated in all clinical situations.     eGFR's persistently <90 mL/min signify possible Chronic Kidney     Disease.  Randolm Idol, ED     Status: None   Collection Time    12/14/13  1:37 PM      Result Value Ref Range   Troponin i, poc 0.01  0.00 - 0.08 ng/mL   Comment 3            Comment: Due to the release kinetics of cTnI,     a negative result within the first hours     of the onset of symptoms does not rule out     myocardial infarction with certainty.     If myocardial infarction is still suspected,     repeat the test at appropriate intervals.  MRSA PCR SCREENING     Status: None   Collection Time    12/14/13  8:15 PM      Result  Value Ref Range   MRSA by PCR NEGATIVE  NEGATIVE   Comment:            The GeneXpert MRSA Assay (FDA     approved for NASAL specimens     only), is one component of a     comprehensive MRSA colonization     surveillance program. It is not     intended to diagnose MRSA     infection nor to guide or     monitor treatment for     MRSA infections.  GLUCOSE, CAPILLARY     Status: Abnormal   Collection Time    12/14/13  8:39 PM      Result Value Ref Range   Glucose-Capillary 335 (*) 70 - 99 mg/dL  TROPONIN I     Status: None   Collection Time    12/14/13 10:10 PM      Result Value Ref Range   Troponin I <0.30  <0.30 ng/mL   Comment:            Due to the release kinetics of cTnI,     a negative result within the first hours     of the onset of symptoms does not rule out     myocardial infarction with certainty.     If myocardial infarction is still suspected,     repeat the test at appropriate intervals.  HEPARIN LEVEL (UNFRACTIONATED)     Status: Abnormal   Collection Time    12/15/13 12:05 AM      Result Value Ref Range   Heparin Unfractionated <0.10 (*) 0.30 - 0.70 IU/mL   Comment:            IF HEPARIN RESULTS ARE BELOW     EXPECTED VALUES, AND PATIENT     DOSAGE  HAS BEEN CONFIRMED,     SUGGEST FOLLOW UP TESTING     OF ANTITHROMBIN III LEVELS.  CBC     Status: Abnormal   Collection Time    12/15/13  4:15 AM      Result Value Ref Range   WBC 9.3  4.0 - 10.5 K/uL   RBC 4.74  3.87 - 5.11 MIL/uL   Hemoglobin 13.1  12.0 - 15.0 g/dL   HCT 36.9  36.0 - 46.0 %   MCV 77.8 (*) 78.0 - 100.0 fL   MCH 27.6  26.0 - 34.0 pg   MCHC 35.5  30.0 - 36.0 g/dL   RDW 13.9  11.5 - 15.5 %   Platelets 249  150 - 400 K/uL  TROPONIN I     Status: None   Collection Time    12/15/13  4:15 AM      Result Value Ref Range   Troponin I <0.30  <0.30 ng/mL   Comment:            Due to the release kinetics of cTnI,     a negative result within the first hours     of the onset of symptoms does not rule out     myocardial infarction with certainty.     If myocardial infarction is still suspected,     repeat the test at appropriate intervals.    Imaging: Imaging results have been reviewed  Assessment/Plan:   1. Principal Problem: 2.   Unstable angina 3. Active Problems: 4.   Type II or unspecified type diabetes mellitus without mention of complication, uncontrolled 5.   Obesity 6.   Coronary artery disease  7.   Time Spent Directly with Patient:  20 minutes  Length of Stay:  LOS: 1 day   Pt ruled out. No further CP. Exam benign. She says that she can afford and will be compliant with her meds. I've reiterated the importance of this. OK for D/C home.   Lorretta Harp 12/15/2013, 8:18 AM

## 2013-12-15 NOTE — Care Management Note (Addendum)
  Page 2 of 2   12/15/2013     12:00:33 PM   CARE MANAGEMENT NOTE 12/15/2013  Patient:  DOLA, LUNSFORD   Account Number:  [id number]  Date Initiated:  12/15/2013  Documentation initiated by:  Pantera Winterrowd  Subjective/Objective Assessment:   Admitted with unstable angina  chest pain 9/10  Hx/o smoker  and uncontrolled DM     Action/Plan:   CM to follow for disposition needs   Anticipated DC Date:  12/15/2013   Anticipated DC Plan:  [place]  CM consult  Medication Assistance      Choice offered to / List presented to:             Status of service:  Completed, signed off Medicare Important Message given?   (If response is "NO", the following Medicare IM given date fields will be blank) Date Medicare IM given:   Date Additional Medicare IM given:    Discharge Disposition:  HOME/SELF CARE  Per UR Regulation:  Reviewed for med. necessity/level of care/duration of stay  If discussed at [place] of Stay Meetings, dates discussed:    Comments:  Admitted with unstable angina chest pain 9/10 Hx/o smoker  and uncontrolled DM PCP:  Dr. [name]  Benefits Check Update: Contact: DEVAN @ [employer] # [phone] BRILINTA  90 MG BID 30 DAY SUPPLY COVER - YES CO-PAY- $ 100.00 Bertha RN, BSN, MSHL, CCM 12/15/2013  ---12/15/2013 1032 by Mariann Laster--- Benefits Check:  Brilinta 90 mg by mouth 2 x/day Please check coverage, co-pay, deductibles and authorization requirements.  Patient for d/c today. Thanks, [employer] [phone]

## 2013-12-18 ENCOUNTER — Ambulatory Visit (HOSPITAL_COMMUNITY): Payer: Self-pay

## 2013-12-20 ENCOUNTER — Ambulatory Visit (HOSPITAL_COMMUNITY): Payer: Self-pay

## 2013-12-22 ENCOUNTER — Ambulatory Visit (HOSPITAL_COMMUNITY): Payer: Self-pay

## 2013-12-25 ENCOUNTER — Ambulatory Visit (HOSPITAL_COMMUNITY): Payer: Self-pay

## 2013-12-27 ENCOUNTER — Ambulatory Visit (HOSPITAL_COMMUNITY): Payer: Self-pay

## 2013-12-29 ENCOUNTER — Ambulatory Visit (HOSPITAL_COMMUNITY): Payer: Self-pay

## 2014-01-01 ENCOUNTER — Ambulatory Visit (HOSPITAL_COMMUNITY): Payer: Self-pay

## 2014-01-03 ENCOUNTER — Ambulatory Visit (HOSPITAL_COMMUNITY): Payer: Self-pay

## 2014-01-05 ENCOUNTER — Ambulatory Visit (HOSPITAL_COMMUNITY): Payer: Self-pay

## 2014-01-08 ENCOUNTER — Ambulatory Visit (HOSPITAL_COMMUNITY): Payer: Self-pay

## 2014-01-09 ENCOUNTER — Ambulatory Visit: Payer: Self-pay | Admitting: Cardiovascular Disease

## 2014-01-10 ENCOUNTER — Ambulatory Visit (HOSPITAL_COMMUNITY): Payer: Self-pay

## 2014-01-12 ENCOUNTER — Ambulatory Visit (HOSPITAL_COMMUNITY): Payer: Self-pay

## 2014-01-15 ENCOUNTER — Ambulatory Visit (HOSPITAL_COMMUNITY): Payer: Self-pay

## 2014-01-17 ENCOUNTER — Ambulatory Visit (HOSPITAL_COMMUNITY): Payer: Self-pay

## 2014-01-19 ENCOUNTER — Telehealth (HOSPITAL_COMMUNITY): Payer: Self-pay | Admitting: Cardiac Rehabilitation

## 2014-01-19 ENCOUNTER — Ambulatory Visit (HOSPITAL_COMMUNITY): Payer: Self-pay

## 2014-01-19 NOTE — Telephone Encounter (Signed)
pc to pt to enroll in cardiac rehab. Pt states she is back to work and feels she does not need to participate.  Pt states she does have her brilinta however has not followed up with Dr. Gwenlyn Found in office due to her work schedule.  Pt advised of importance of hospital f/u appt. Pt states she will call office to schedule the appointment.  Offered to call office for pt, pt declined stating she prefers to call on her own. Pt verbalized understanding of taking medication and physician followup.

## 2014-01-22 ENCOUNTER — Ambulatory Visit (HOSPITAL_COMMUNITY): Payer: Self-pay

## 2014-01-24 ENCOUNTER — Ambulatory Visit (HOSPITAL_COMMUNITY): Payer: Self-pay

## 2014-01-26 ENCOUNTER — Ambulatory Visit (HOSPITAL_COMMUNITY): Payer: Self-pay

## 2014-01-29 ENCOUNTER — Ambulatory Visit (HOSPITAL_COMMUNITY): Payer: Self-pay

## 2014-01-31 ENCOUNTER — Ambulatory Visit (HOSPITAL_COMMUNITY): Payer: Self-pay

## 2014-02-02 ENCOUNTER — Ambulatory Visit (HOSPITAL_COMMUNITY): Payer: Self-pay

## 2014-02-05 ENCOUNTER — Ambulatory Visit (HOSPITAL_COMMUNITY): Payer: Self-pay

## 2014-02-07 ENCOUNTER — Ambulatory Visit (HOSPITAL_COMMUNITY): Payer: Self-pay

## 2014-02-09 ENCOUNTER — Ambulatory Visit (HOSPITAL_COMMUNITY): Payer: Self-pay

## 2014-02-12 ENCOUNTER — Ambulatory Visit (HOSPITAL_COMMUNITY): Payer: Self-pay

## 2014-02-13 ENCOUNTER — Telehealth: Payer: Self-pay | Admitting: Cardiovascular Disease

## 2014-02-13 MED ORDER — TICAGRELOR 90 MG PO TABS: 90.0000 mg | ORAL_TABLET | Freq: Two times a day (BID) | ORAL | Status: DC

## 2014-02-13 NOTE — Telephone Encounter (Signed)
Pt need some samples of Brilinta,ans the other one she could not remember it.She said she takes a 1/2 of it ,twice a day.

## 2014-02-13 NOTE — Telephone Encounter (Signed)
Samples of Brilinta provided. Patient aware they will be at front desk. Patient has been out of lopressor 12.5mg  BID x1 month but should be able to pick up medication this Friday 6/12

## 2014-02-14 ENCOUNTER — Ambulatory Visit (HOSPITAL_COMMUNITY): Payer: Self-pay

## 2014-02-16 ENCOUNTER — Ambulatory Visit (HOSPITAL_COMMUNITY): Payer: Self-pay

## 2014-02-19 ENCOUNTER — Ambulatory Visit (HOSPITAL_COMMUNITY): Payer: Self-pay

## 2014-02-21 ENCOUNTER — Ambulatory Visit (HOSPITAL_COMMUNITY): Payer: Self-pay

## 2014-02-22 ENCOUNTER — Emergency Department (HOSPITAL_COMMUNITY): Payer: BC Managed Care – PPO

## 2014-02-22 ENCOUNTER — Encounter (HOSPITAL_COMMUNITY): Payer: Self-pay | Admitting: Emergency Medicine

## 2014-02-22 ENCOUNTER — Observation Stay (HOSPITAL_COMMUNITY)
Admission: EM | Admit: 2014-02-22 | Discharge: 2014-02-23 | Disposition: A | Discharge status: Home / Self Care | Payer: BC Managed Care – PPO | Attending: Cardiovascular Disease | Admitting: Cardiovascular Disease

## 2014-02-22 DIAGNOSIS — Z7982 Long term (current) use of aspirin: Secondary | ICD-10-CM | POA: Insufficient documentation

## 2014-02-22 DIAGNOSIS — M129 Arthropathy, unspecified: Secondary | ICD-10-CM | POA: Insufficient documentation

## 2014-02-22 DIAGNOSIS — E1151 Type 2 diabetes mellitus with diabetic peripheral angiopathy without gangrene: Secondary | ICD-10-CM | POA: Diagnosis present

## 2014-02-22 DIAGNOSIS — E119 Type 2 diabetes mellitus without complications: Secondary | ICD-10-CM | POA: Insufficient documentation

## 2014-02-22 DIAGNOSIS — I2510 Atherosclerotic heart disease of native coronary artery without angina pectoris: Secondary | ICD-10-CM | POA: Diagnosis present

## 2014-02-22 DIAGNOSIS — R072 Precordial pain: Secondary | ICD-10-CM

## 2014-02-22 DIAGNOSIS — R079 Chest pain, unspecified: Secondary | ICD-10-CM

## 2014-02-22 DIAGNOSIS — I25118 Atherosclerotic heart disease of native coronary artery with other forms of angina pectoris: Secondary | ICD-10-CM

## 2014-02-22 DIAGNOSIS — Z880 Allergy status to penicillin: Secondary | ICD-10-CM | POA: Insufficient documentation

## 2014-02-22 DIAGNOSIS — E669 Obesity, unspecified: Secondary | ICD-10-CM | POA: Insufficient documentation

## 2014-02-22 DIAGNOSIS — F17200 Nicotine dependence, unspecified, uncomplicated: Secondary | ICD-10-CM | POA: Insufficient documentation

## 2014-02-22 DIAGNOSIS — R0602 Shortness of breath: Secondary | ICD-10-CM | POA: Insufficient documentation

## 2014-02-22 DIAGNOSIS — IMO0002 Reserved for concepts with insufficient information to code with codable children: Secondary | ICD-10-CM | POA: Diagnosis present

## 2014-02-22 DIAGNOSIS — R0789 Other chest pain: Principal | ICD-10-CM | POA: Insufficient documentation

## 2014-02-22 DIAGNOSIS — Z79899 Other long term (current) drug therapy: Secondary | ICD-10-CM | POA: Insufficient documentation

## 2014-02-22 DIAGNOSIS — Z9861 Coronary angioplasty status: Secondary | ICD-10-CM | POA: Insufficient documentation

## 2014-02-22 DIAGNOSIS — E785 Hyperlipidemia, unspecified: Secondary | ICD-10-CM | POA: Insufficient documentation

## 2014-02-22 DIAGNOSIS — E1165 Type 2 diabetes mellitus with hyperglycemia: Secondary | ICD-10-CM

## 2014-02-22 DIAGNOSIS — I70209 Unspecified atherosclerosis of native arteries of extremities, unspecified extremity: Secondary | ICD-10-CM

## 2014-02-22 DIAGNOSIS — I200 Unstable angina: Secondary | ICD-10-CM | POA: Diagnosis present

## 2014-02-22 DIAGNOSIS — IMO0001 Reserved for inherently not codable concepts without codable children: Secondary | ICD-10-CM

## 2014-02-22 DIAGNOSIS — I252 Old myocardial infarction: Secondary | ICD-10-CM | POA: Insufficient documentation

## 2014-02-22 HISTORY — DX: Shortness of breath: R06.02

## 2014-02-22 HISTORY — DX: Major depressive disorder, single episode, unspecified: F32.9

## 2014-02-22 HISTORY — DX: Gastro-esophageal reflux disease without esophagitis: K21.9

## 2014-02-22 HISTORY — DX: Angina pectoris, unspecified: I20.9

## 2014-02-22 HISTORY — DX: Depression, unspecified: F32.A

## 2014-02-22 HISTORY — DX: Cardiac arrhythmia, unspecified: I49.9

## 2014-02-22 HISTORY — DX: Anxiety disorder, unspecified: F41.9

## 2014-02-22 HISTORY — DX: Unspecified asthma, uncomplicated: J45.909

## 2014-02-22 HISTORY — DX: Headache: R51

## 2014-02-22 LAB — CBC
HCT: 35.2 % — ABNORMAL LOW (ref 36.0–46.0)
HCT: 33.4 % — ABNORMAL LOW (ref 36.0–46.0)
HEMOGLOBIN: 11.5 g/dL — AB (ref 12.0–15.0)
Hemoglobin: 12.1 g/dL (ref 12.0–15.0)
MCH: 26.8 pg (ref 26.0–34.0)
MCH: 26.8 pg (ref 26.0–34.0)
MCHC: 34.4 g/dL (ref 30.0–36.0)
MCHC: 34.4 g/dL (ref 30.0–36.0)
MCV: 77.9 fL — AB (ref 78.0–100.0)
MCV: 77.9 fL — AB (ref 78.0–100.0)
PLATELETS: 278 10*3/uL (ref 150–400)
PLATELETS: 256 10*3/uL (ref 150–400)
RBC: 4.52 MIL/uL (ref 3.87–5.11)
RBC: 4.29 MIL/uL (ref 3.87–5.11)
RDW: 14.2 % (ref 11.5–15.5)
RDW: 14.2 % (ref 11.5–15.5)
WBC: 8.6 10*3/uL (ref 4.0–10.5)
WBC: 8.4 10*3/uL (ref 4.0–10.5)

## 2014-02-22 LAB — BASIC METABOLIC PANEL
BUN: 7 mg/dL (ref 6–23)
CALCIUM: 8.4 mg/dL (ref 8.4–10.5)
CO2: 23 meq/L (ref 19–32)
CREATININE: 0.36 mg/dL — AB (ref 0.50–1.10)
Chloride: 104 mEq/L (ref 96–112)
GFR calc Af Amer: >90 mL/min (ref >90)
GFR calc non Af Amer: >90 mL/min (ref >90)
Glucose, Bld: 283 mg/dL — ABNORMAL HIGH (ref 70–99)
Potassium: 3.9 mEq/L (ref 3.7–5.3)
Sodium: 140 mEq/L (ref 137–147)

## 2014-02-22 LAB — COMPREHENSIVE METABOLIC PANEL
ALT: 20 U/L (ref 0–35)
AST: 15 U/L (ref 0–37)
Albumin: 3 g/dL — ABNORMAL LOW (ref 3.5–5.2)
Alkaline Phosphatase: 104 U/L (ref 39–117)
BUN: 6 mg/dL (ref 6–23)
CALCIUM: 8.3 mg/dL — AB (ref 8.4–10.5)
CO2: 25 mEq/L (ref 19–32)
Chloride: 103 mEq/L (ref 96–112)
Creatinine, Ser: 0.36 mg/dL — ABNORMAL LOW (ref 0.50–1.10)
GFR calc Af Amer: >90 mL/min (ref >90)
Glucose, Bld: 281 mg/dL — ABNORMAL HIGH (ref 70–99)
Potassium: 3.9 mEq/L (ref 3.7–5.3)
SODIUM: 139 meq/L (ref 137–147)
Total Bilirubin: 0.2 mg/dL — ABNORMAL LOW (ref 0.3–1.2)
Total Protein: 6.6 g/dL (ref 6.0–8.3)

## 2014-02-22 LAB — GLUCOSE, CAPILLARY
GLUCOSE-CAPILLARY: 221 mg/dL — AB (ref 70–99)
Glucose-Capillary: 294 mg/dL — ABNORMAL HIGH (ref 70–99)
Glucose-Capillary: 211 mg/dL — ABNORMAL HIGH (ref 70–99)

## 2014-02-22 LAB — TROPONIN I
Troponin I: <0.3 ng/mL (ref <0.30)
Troponin I: <0.3 ng/mL (ref <0.30)
Troponin I: <0.3 ng/mL (ref <0.30)

## 2014-02-22 LAB — PRO B NATRIURETIC PEPTIDE
Pro B Natriuretic peptide (BNP): 10.1 pg/mL (ref 0–125)
Pro B Natriuretic peptide (BNP): 14.9 pg/mL (ref 0–125)

## 2014-02-22 LAB — I-STAT TROPONIN, ED: Troponin i, poc: 0 ng/mL (ref 0.00–0.08)

## 2014-02-22 LAB — HEMOGLOBIN A1C
Hgb A1c MFr Bld: 11.5 % — ABNORMAL HIGH (ref <5.7)
Mean Plasma Glucose: 283 mg/dL — ABNORMAL HIGH (ref <117)

## 2014-02-22 MED ORDER — ONDANSETRON HCL 4 MG/2ML IJ SOLN
4.0000 mg | Freq: Once | INTRAMUSCULAR | Status: AC
  Administered 2014-02-22: 4 mg via INTRAVENOUS
  Filled 2014-02-22: qty 2

## 2014-02-22 MED ORDER — METOPROLOL TARTRATE 12.5 MG HALF TABLET
12.5000 mg | ORAL_TABLET | Freq: Two times a day (BID) | ORAL | Status: DC
  Administered 2014-02-22 – 2014-02-23 (×2): 12.5 mg via ORAL
  Filled 2014-02-22 (×4): qty 1

## 2014-02-22 MED ORDER — TICAGRELOR 90 MG PO TABS
90.0000 mg | ORAL_TABLET | Freq: Two times a day (BID) | ORAL | Status: DC
  Administered 2014-02-22 – 2014-02-23 (×2): 90 mg via ORAL
  Filled 2014-02-22 (×5): qty 1

## 2014-02-22 MED ORDER — TICAGRELOR 90 MG PO TABS
90.0000 mg | ORAL_TABLET | Freq: Once | ORAL | Status: AC
  Administered 2014-02-22: 90 mg via ORAL
  Filled 2014-02-22: qty 1

## 2014-02-22 MED ORDER — ASPIRIN 325 MG PO TABS
325.0000 mg | ORAL_TABLET | ORAL | Status: AC
  Administered 2014-02-22: 325 mg via ORAL
  Filled 2014-02-22: qty 1

## 2014-02-22 MED ORDER — LINAGLIPTIN 5 MG PO TABS
5.0000 mg | ORAL_TABLET | Freq: Every day | ORAL | Status: DC
  Administered 2014-02-22 – 2014-02-23 (×2): 5 mg via ORAL
  Filled 2014-02-22 (×2): qty 1

## 2014-02-22 MED ORDER — SODIUM CHLORIDE 0.9 % IV BOLUS (SEPSIS)
500.0000 mL | Freq: Once | INTRAVENOUS | Status: AC
  Administered 2014-02-22: 500 mL via INTRAVENOUS

## 2014-02-22 MED ORDER — NITROGLYCERIN IN D5W 200-5 MCG/ML-% IV SOLN
2.0000 ug/min | Freq: Once | INTRAVENOUS | Status: AC
Start: 2014-02-22 — End: 2014-02-22
  Administered 2014-02-22: 5 ug/min via INTRAVENOUS
  Filled 2014-02-22: qty 1250

## 2014-02-22 MED ORDER — ACETAMINOPHEN 500 MG PO TABS: 1000.0000 mg | ORAL_TABLET | Freq: Four times a day (QID) | ORAL | Status: DC | PRN

## 2014-02-22 MED ORDER — ENOXAPARIN SODIUM 120 MG/0.8ML ~~LOC~~ SOLN
1.0000 mg/kg | Freq: Two times a day (BID) | SUBCUTANEOUS | Status: DC
  Administered 2014-02-23: 105 mg via SUBCUTANEOUS
  Filled 2014-02-22 (×3): qty 0.8

## 2014-02-22 MED ORDER — ATORVASTATIN CALCIUM 80 MG PO TABS
80.0000 mg | ORAL_TABLET | Freq: Every day | ORAL | Status: DC
  Administered 2014-02-22: 80 mg via ORAL
  Filled 2014-02-22 (×2): qty 1

## 2014-02-22 MED ORDER — MORPHINE SULFATE 2 MG/ML IJ SOLN
2.0000 mg | Freq: Once | INTRAMUSCULAR | Status: AC
  Administered 2014-02-22: 2 mg via INTRAVENOUS
  Filled 2014-02-22: qty 1

## 2014-02-22 MED ORDER — SODIUM CHLORIDE 0.9 % IV BOLUS (SEPSIS)
250.0000 mL | Freq: Once | INTRAVENOUS | Status: AC
  Administered 2014-02-22: 250 mL via INTRAVENOUS

## 2014-02-22 MED ORDER — INSULIN ASPART 100 UNIT/ML ~~LOC~~ SOLN
0.0000 [IU] | Freq: Three times a day (TID) | SUBCUTANEOUS | Status: DC
  Administered 2014-02-23: 7 [IU] via SUBCUTANEOUS

## 2014-02-22 MED ORDER — METFORMIN HCL ER 750 MG PO TB24
2000.0000 mg | ORAL_TABLET | Freq: Every day | ORAL | Status: DC
  Administered 2014-02-23: 2000 mg via ORAL
  Filled 2014-02-22 (×3): qty 1

## 2014-02-22 MED ORDER — SITAGLIP PHOS-METFORMIN HCL ER 50-1000 MG PO TB24: 2.0000 | ORAL_TABLET | Freq: Every day | ORAL | Status: DC

## 2014-02-22 MED ORDER — ENOXAPARIN SODIUM 120 MG/0.8ML ~~LOC~~ SOLN
1.0000 mg/kg | Freq: Two times a day (BID) | SUBCUTANEOUS | Status: DC
  Administered 2014-02-22: 105 mg via SUBCUTANEOUS
  Filled 2014-02-22 (×2): qty 0.8

## 2014-02-22 MED ORDER — ASPIRIN EC 81 MG PO TBEC
81.0000 mg | DELAYED_RELEASE_TABLET | Freq: Every day | ORAL | Status: DC
  Administered 2014-02-23: 81 mg via ORAL
  Filled 2014-02-22 (×2): qty 1

## 2014-02-22 MED ORDER — NITROGLYCERIN 0.4 MG SL SUBL: 0.4000 mg | SUBLINGUAL_TABLET | SUBLINGUAL | Status: DC | PRN

## 2014-02-22 NOTE — ED Notes (Signed)
Patient and her husband was given a coke and gram crackers.

## 2014-02-22 NOTE — ED Notes (Signed)
The pt has had lt upper chest pain with lt arm radiation since 0550 this am with dizziness and sob. She has not had aspirin but she took a sl nitro that helped with the pain some what

## 2014-02-22 NOTE — H&P (Signed)
Maureen Price is a 38 y.o. female  Admit Date: 02/22/2014 Referring Physician:  [name], MD Primary Cardiologist: Lanier Prude, MD Chief complaint / reason for admission:  Chest and neck pain  HPI: 38 year old with h/o CAD and LAD stent in 10/2013. Recurrent chest pain has been an issue. Had re-look prior to discharge with widely patent LAD stent and resolution of RCA thrombus. Readmitted 12/2013 and ruled out  required no ischemic w/u. Now presents with left neck, shoulder and upper left chest discomfort requiring opiates to control. Has nausea and some dyspnea but no diaphoresis. The patients complaints are out of proportion to her appearance. Two NTG helped some.    PMH:    Past Medical History  Diagnosis Date  . Diabetes mellitus     2002; no meds since 2004  . Acute myocardial infarction of other anterior wall, initial episode of care   . Paroxysmal ventricular tachycardia   . Obesity   . Dyslipidemia, goal LDL below 70 10/26/2013  . STEMI (ST elevation myocardial infarction)   . CAD (coronary artery disease)   . Arthritis     PSH:    Past Surgical History  Procedure Laterality Date  . Cesarean section    . Gallbladder surgery    . Tubal ligation      2000  . Cesarean section  [id number]  . Coronary angioplasty with stent placement     ALLERGIES:   Erythromycin; Penicillins; and Viibryd Prior to Admit Meds:   (Not in a hospital admission) Family HX:    Family History  Problem Relation Age of Onset  . Arthritis Mother   . Diabetes Mother   . Heart disease Father   . Stroke Father   . Cancer Neg Hx   . Alcohol abuse Neg Hx   . Early death Neg Hx   . Kidney disease Neg Hx   . Hypertension Neg Hx   . Hyperlipidemia Neg Hx   . Diabetes Maternal Grandfather    Social HX:    History   Social History  . Marital Status: Married    Spouse Name: N/A    Number of Children: N/A  . Years of Education: N/A   Occupational History  . Not on  file.   Social History Main Topics  . Smoking status: Current Every Day Smoker -- 0.50 packs/day for 20 years    Types: Cigarettes  . Smokeless tobacco: Not on file  . Alcohol Use: 0.0 oz/week     Comment: occasional  . Drug Use: No  . Sexual Activity: Yes    Birth Control/ Protection: Surgical   Other Topics Concern  . Not on file   Social History Narrative   Caffienated drinks- yes   Seat belt use often- yes   Regular Exercise- yes   Smoke alarm in the home- yes   Firearms/guns in the home- no   History of physical abuse- no              ROS: Advocates compliance with therapy. No neurological complaints. Still smoking.  Physical Exam: Blood pressure 109/71, pulse 73, temperature 98.4 F (36.9 C), temperature source Oral, resp. rate 19, height 5\' 4"  (1.626 m), weight 235 lb (106.595 kg), SpO2 100.00%.   Skin clear HEENT unremarkable. Neck without JVD Chest with mild anterior sternal soreness. Clear lungs. Cardiac with no rub or gallop. No murmur. Abdomen is soft and there is no tenderness. Extremities reveal no edema. Neuro  is unremarkable. Labs: Lab Results  Component Value Date   WBC 8.6 02/22/2014   HGB 12.1 02/22/2014   HCT 35.2* 02/22/2014   MCV 77.9* 02/22/2014   PLT 278 02/22/2014    Recent Labs Lab 02/22/14 0632  NA 140  K 3.9  CL 104  CO2 23  BUN 7  CREATININE 0.36*  CALCIUM 8.4  GLUCOSE 283*   Lab Results  Component Value Date   FOYDXAJ 287* 10/25/2013   CKMB 57.2* 10/25/2013   TROPONINI <0.30 12/15/2013     Radiology:  No results found.  EKG:  NSR with NSTW flattening.  ASSESSMENT: 1. Prolonged chest, neck, and shoulder pain. This could be musculoskeletal or ischemic. With h/o recurrent CP and patent stent will place in obs and cycle markers. 2. DM type 2 3. Obesity 4. Tobacco use.  Plan:  1. IV NTG 2. Cycle markers and repeat ECG's . 3. NPO after MN. 4. If she rules out my inclination would be to discharge.   Sinclair Grooms  02/22/2014 11:22 AM

## 2014-02-22 NOTE — ED Notes (Signed)
Cardiology at the bedside.

## 2014-02-22 NOTE — ED Provider Notes (Signed)
CSN: [id number]     Arrival date & time 02/22/14  0617 History   First MD Initiated Contact with Patient 02/22/14 [phone]     Chief Complaint  Patient presents with  . Chest Pain     (Consider location/radiation/quality/duration/timing/severity/associated sxs/prior Treatment) Patient is a 38 y.o. female presenting with chest pain. The history is provided by the patient.  Chest Pain Pain location:  Substernal area Pain quality: tightness   Pain radiates to:  L shoulder Pain radiates to the back: no   Pain severity:  Moderate Onset quality:  Sudden Timing:  Constant Progression:  Waxing and waning Chronicity:  New Context: at rest   Relieved by:  Nitroglycerin Worsened by:  Nothing tried Ineffective treatments:  None tried Associated symptoms: shortness of breath   Associated symptoms: no abdominal pain, no back pain, no cough, no dizziness, no fatigue, no fever, no headache, no nausea and not vomiting     Past Medical History  Diagnosis Date  . Diabetes mellitus     2002; no meds since 2004  . Acute myocardial infarction of other anterior wall, initial episode of care   . Paroxysmal ventricular tachycardia   . Obesity   . Dyslipidemia, goal LDL below 70 10/26/2013  . STEMI (ST elevation myocardial infarction)   . CAD (coronary artery disease)   . Arthritis    Past Surgical History  Procedure Laterality Date  . Cesarean section    . Gallbladder surgery    . Tubal ligation      2000  . Cesarean section  [id number]  . Coronary angioplasty with stent placement     Family History  Problem Relation Age of Onset  . Arthritis Mother   . Diabetes Mother   . Heart disease Father   . Stroke Father   . Cancer Neg Hx   . Alcohol abuse Neg Hx   . Early death Neg Hx   . Kidney disease Neg Hx   . Hypertension Neg Hx   . Hyperlipidemia Neg Hx   . Diabetes Maternal Grandfather    History  Substance Use Topics  . Smoking status: Current Every Day Smoker -- 0.50 packs/day  for 20 years    Types: Cigarettes  . Smokeless tobacco: Not on file  . Alcohol Use: 0.0 oz/week     Comment: occasional   OB History   Grav Para Term Preterm Abortions TAB SAB Ect Mult Living   4 3 3  1  1   3      Review of Systems  Constitutional: Negative for fever and fatigue.  HENT: Negative for congestion and drooling.   Eyes: Negative for pain.  Respiratory: Positive for shortness of breath. Negative for cough.   Cardiovascular: Positive for chest pain.  Gastrointestinal: Negative for nausea, vomiting, abdominal pain and diarrhea.  Genitourinary: Negative for dysuria and hematuria.  Musculoskeletal: Negative for back pain, gait problem and neck pain.  Skin: Negative for color change.  Neurological: Negative for dizziness and headaches.  Hematological: Negative for adenopathy.  Psychiatric/Behavioral: Negative for behavioral problems.  All other systems reviewed and are negative.     Allergies  Erythromycin; Penicillins; and Viibryd  Home Medications   Prior to Admission medications   Medication Sig Start Date End Date Taking? Authorizing Caliann Leckrone  acetaminophen (TYLENOL) 500 MG tablet Take 1,000 mg by mouth every 6 (six) hours as needed for mild pain.   Yes Historical Jayvien Rowlette, MD  aspirin EC 81 MG EC tablet Take 1 tablet (81 mg  total) by mouth daily. 10/26/13  Yes Cecilie Kicks, NP  atorvastatin (LIPITOR) 80 MG tablet Take 1 tablet (80 mg total) by mouth daily at 6 PM. 10/26/13  Yes Cecilie Kicks, NP  metoprolol tartrate (LOPRESSOR) 25 MG tablet Take 0.5 tablets (12.5 mg total) by mouth 2 (two) times daily. 10/26/13  Yes Cecilie Kicks, NP  nitroGLYCERIN (NITROSTAT) 0.4 MG SL tablet Place 1 tablet (0.4 mg total) under the tongue every 5 (five) minutes x 3 doses as needed for chest pain. 10/26/13  Yes Cecilie Kicks, NP  SitaGLIPtin-MetFORMIN HCl (JANUMET XR) 50-1000 MG TB24 Take 2 tablets by mouth daily. 10/29/13  Yes Cecilie Kicks, NP  ticagrelor (BRILINTA) 90 MG TABS tablet  Take 1 tablet (90 mg total) by mouth 2 (two) times daily. 02/13/14  Yes Lorretta Harp, MD   BP 111/68  Pulse 87  Temp(Src) 98.4 F (36.9 C) (Oral)  Resp 18  Ht 5\' 4"  (1.626 m)  Wt 235 lb (106.595 kg)  BMI 40.32 kg/m2  SpO2 98% Physical Exam  Nursing note and vitals reviewed. Constitutional: She is oriented to person, place, and time. She appears well-developed and well-nourished.  HENT:  Head: Normocephalic.  Mouth/Throat: Oropharynx is clear and moist. No oropharyngeal exudate.  Eyes: Conjunctivae and EOM are normal. Pupils are equal, round, and reactive to light.  Neck: Normal range of motion. Neck supple.  Cardiovascular: Normal rate, regular rhythm, normal heart sounds and intact distal pulses.  Exam reveals no gallop and no friction rub.   No murmur heard. Pulmonary/Chest: Effort normal and breath sounds normal. No respiratory distress. She has no wheezes.  Abdominal: Soft. Bowel sounds are normal. There is no tenderness. There is no rebound and no guarding.  Musculoskeletal: Normal range of motion. She exhibits no edema and no tenderness.  Neurological: She is alert and oriented to person, place, and time.  Skin: Skin is warm and dry.  Psychiatric: She has a normal mood and affect. Her behavior is normal.    ED Course  Procedures (including critical care time) Labs Review Labs Reviewed  CBC - Abnormal; Notable for the following:    HCT 35.2 (*)    MCV 77.9 (*)    All other components within normal limits  BASIC METABOLIC PANEL  PRO B NATRIURETIC PEPTIDE  I-STAT TROPOININ, ED    Imaging Review Dg Chest 2 View  02/22/2014   CLINICAL DATA:  Left upper chest pain  EXAM: CHEST  2 VIEW  COMPARISON:  12/14/2013  FINDINGS: Lungs are clear.  No pleural effusion or pneumothorax.  The heart is normal in size.  Mild degenerative changes of the visualized thoracolumbar spine.  IMPRESSION: No evidence of acute cardiopulmonary disease.   Electronically Signed   By: Julian Hy M.D.   On: 02/22/2014 07:44     EKG Interpretation   Date/Time:  Thursday February 22 2014 06:22:06 EDT Ventricular Rate:  90 PR Interval:  192 QRS Duration: 80 QT Interval:  366 QTC Calculation: 447 R Axis:   4 Text Interpretation:  Normal sinus rhythm Septal infarct , age  undetermined Abnormal ECG No significant change since last tracing  Confirmed by YAO  MD, DAVID ([id number]) on 02/22/2014 6:35:00 AM      MDM   Final diagnoses:  Chest pain    7:29 AM 38 y.o. female w history of CAD and recent STEMI on 10/24/13 s/p PCI w/ DES who presents with chest tightness which began this morning at approximately 5:40 AM upon awakening. She  notes that she also felt some shortness of breath. She notes the pain has been waxing and waning since that time. She took one nitroglycerin which brought her pain from a 9 down to a 4. She notes her pain is currently a 4 on exam. She states that it feels similar to her previous MI. Just complains of some intermittent left groin pain which began yesterday. The examination of her groin is unremarkable. She is afebrile and vital signs are unremarkable here. Will give aspirin. Will give patient her brilinta which she has not taken yet today. BP is soft, will give 2mg  morphine for pain and 250cc bolus.   Consulted cards who will admit.     Blanchard Kelch, MD 02/22/14 [phone]

## 2014-02-22 NOTE — ED Notes (Signed)
Pt. reports mid chest tightness radiating to left arm with SOB onset this morning .

## 2014-02-23 ENCOUNTER — Other Ambulatory Visit: Payer: Self-pay | Admitting: Physician Assistant

## 2014-02-23 ENCOUNTER — Ambulatory Visit (HOSPITAL_COMMUNITY): Payer: Self-pay

## 2014-02-23 DIAGNOSIS — I208 Other forms of angina pectoris: Secondary | ICD-10-CM

## 2014-02-23 DIAGNOSIS — R072 Precordial pain: Secondary | ICD-10-CM

## 2014-02-23 LAB — BASIC METABOLIC PANEL
BUN: 7 mg/dL (ref 6–23)
CHLORIDE: 101 meq/L (ref 96–112)
CO2: 23 mEq/L (ref 19–32)
Calcium: 9.1 mg/dL (ref 8.4–10.5)
Creatinine, Ser: 0.36 mg/dL — ABNORMAL LOW (ref 0.50–1.10)
GFR calc Af Amer: >90 mL/min (ref >90)
GLUCOSE: 271 mg/dL — AB (ref 70–99)
Potassium: 4.4 mEq/L (ref 3.7–5.3)
SODIUM: 139 meq/L (ref 137–147)

## 2014-02-23 LAB — LIPID PANEL
Cholesterol: 126 mg/dL (ref 0–200)
HDL: 19 mg/dL — AB (ref >39)
LDL Cholesterol: 76 mg/dL (ref 0–99)
TRIGLYCERIDES: 156 mg/dL — AB (ref <150)
Total CHOL/HDL Ratio: 6.6 RATIO
VLDL: 31 mg/dL (ref 0–40)

## 2014-02-23 LAB — TROPONIN I: Troponin I: <0.3 ng/mL (ref <0.30)

## 2014-02-23 LAB — GLUCOSE, CAPILLARY: Glucose-Capillary: 249 mg/dL — ABNORMAL HIGH (ref 70–99)

## 2014-02-23 MED ORDER — TAB-A-VITE/IRON PO TABS: 1.0000 | ORAL_TABLET | Freq: Every day | ORAL | Status: DC

## 2014-02-23 NOTE — Discharge Summary (Signed)
CARDIOLOGY DISCHARGE SUMMARY   Patient ID: Maureen Price MRN: [id number] DOB/AGE: 1975-09-16 38 y.o.  Admit date: 02/22/2014 Discharge date: 02/23/2014  PCP: Dr. [name] Primary Cardiologist: Dr. Gwenlyn Found  Primary Discharge Diagnosis:   Unstable angina  Secondary Discharge Diagnosis:    Type II or unspecified type diabetes mellitus without mention of complication, uncontrolled   Coronary artery disease   Intermediate coronary syndrome  Procedures: Chest Xray  Hospital Course: Maureen Price is a 38 y.o. female with a history of CAD. She has been under a great deal of stress lately. She had an episode of chest pain, not exertional, that resolved with SL NTG x 1.   She was wakened by chest pain on 06/18 and took SL NTG x 2 with partial relief. Her symptoms concerned her so she came to the hospital, where she was admitted for further evaluation and treatment.  Her cardiac enzymes and ECG showed not acute process. Her chest Xray was not acute. She has been compliant with medications and follow-up appointments. Her labs were reviewed. Her Hgb, Hct and MCV are slightly low. She will be encouraged to take MVI and follow up with primary care. Her symptoms are exacerbated by stress, and she will be encouraged to discuss this with her primary MD as well.   On 06/19, she was seen by Dr. Burt Knack and all data were reviewed. No further inpatient workup is indicated, and she is considered stable for discharge, to follow up as an outpatient, with a stress test prior to seeing Dr. Gwenlyn Found.  Labs:  Lab Results  Component Value Date   WBC 8.4 02/22/2014   HGB 11.5* 02/22/2014   HCT 33.4* 02/22/2014   MCV 77.9* 02/22/2014   PLT 256 02/22/2014     Recent Labs Lab 02/22/14 1310  NA 139  K 3.9  CL 103  CO2 25  BUN 6  CREATININE 0.36*  CALCIUM 8.3*  PROT 6.6  BILITOT 0.2*  ALKPHOS 104  ALT 20  AST 15  GLUCOSE 281*    Recent Labs  02/22/14 1200 02/22/14 1919  02/22/14 2344  TROPONINI <0.30 <0.30 <0.30   Lipid Panel     Component Value Date/Time   CHOL 126 02/23/2014 0540   TRIG 156* 02/23/2014 0540   HDL 19* 02/23/2014 0540   CHOLHDL 6.6 02/23/2014 0540   VLDL 31 02/23/2014 0540   LDLCALC 76 02/23/2014 0540    Pro B Natriuretic peptide (BNP)  Date/Time Value Ref Range Status  02/22/2014 12:00 PM 14.9  0 - 125 pg/mL Final  02/22/2014  6:32 AM 10.1  0 - 125 pg/mL Final      Radiology: Dg Chest 2 View 02/22/2014   CLINICAL DATA:  Left upper chest pain  EXAM: CHEST  2 VIEW  COMPARISON:  12/14/2013  FINDINGS: Lungs are clear.  No pleural effusion or pneumothorax.  The heart is normal in size.  Mild degenerative changes of the visualized thoracolumbar spine.  IMPRESSION: No evidence of acute cardiopulmonary disease.   Electronically Signed   By: Julian Hy M.D.   On: 02/22/2014 07:44    EKG: 02/23/2014 SR, no acute changes Vent. rate 82 BPM PR interval 218 ms QRS duration 84 ms QT/QTc 354/413 ms P-R-T axes 58 39 23  FOLLOW UP PLANS AND APPOINTMENTS Allergies  Allergen Reactions  . Erythromycin Anaphylaxis  . Penicillins Anaphylaxis  . Viibryd [Vilazodone Hcl]     Altered mental status.       Medication  List         acetaminophen 500 MG tablet  Commonly known as:  TYLENOL  Take 1,000 mg by mouth every 6 (six) hours as needed for mild pain.     aspirin 81 MG EC tablet  Take 1 tablet (81 mg total) by mouth daily.     atorvastatin 80 MG tablet  Commonly known as:  LIPITOR  Take 1 tablet (80 mg total) by mouth daily at 6 PM.     metoprolol tartrate 25 MG tablet  Commonly known as:  LOPRESSOR  Take 0.5 tablets (12.5 mg total) by mouth 2 (two) times daily.     multivitamins with iron Tabs tablet  Take 1 tablet by mouth daily.     nitroGLYCERIN 0.4 MG SL tablet  Commonly known as:  NITROSTAT  Place 1 tablet (0.4 mg total) under the tongue every 5 (five) minutes x 3 doses as needed for chest pain.      SitaGLIPtin-MetFORMIN HCl 50-1000 MG Tb24  Commonly known as:  JANUMET XR  Take 2 tablets by mouth daily.     ticagrelor 90 MG Tabs tablet  Commonly known as:  BRILINTA  Take 1 tablet (90 mg total) by mouth 2 (two) times daily.        Discharge Instructions   Diet - low sodium heart healthy    Complete by:  As directed      Diet Carb Modified    Complete by:  As directed      Increase activity slowly    Complete by:  As directed           Follow-up Information   Follow up with [name], MD. (Keep 06/29 appt.)    Specialty:  Internal Medicine   Contact information:   [address] [address] [phone]       Follow up with [facility] On 03/06/2014. (Come at 7:00 am for a 7:15 am stress test.)    Contact information:   [address] [place] [address] [phone]      Follow up with Lorretta Harp, MD On 04/03/2014. (at 10:15 am)    Specialty:  Cardiology   Contact information:   [address] [place] [address] [phone]       BRING ALL MEDICATIONS WITH YOU TO FOLLOW UP APPOINTMENTS  Time spent with patient to include physician time: 38 min Signed: Rosaria Ferries, PA-C 02/23/2014, 8:51 AM Co-Sign MD

## 2014-02-23 NOTE — Progress Notes (Signed)
    Subjective:  No recurrence of chest pain. No dyspnea. Chest pain has been related to emotional stress.  Objective:  Vital Signs in the last 24 hours: Temp:  [97.6 F (36.4 C)-98 F (36.7 C)] 98 F (36.7 C) (06/19 0500) Pulse Rate:  [69-87] 75 (06/19 0500) Resp:  [13-24] 18 (06/19 0500) BP: (87-118)/(34-91) 95/34 mmHg (06/19 0500) SpO2:  [96 %-100 %] 99 % (06/19 0500)  Intake/Output from previous day: 06/18 0701 - 06/19 0700 In: 4166 [P.O.:1520; I.V.:250] Out: 1550 [Urine:1550]  Physical Exam: Pt is alert and oriented, obese woman in NAD HEENT: normal Neck: JVP - normal, carotids 2+= without bruits Lungs: CTA bilaterally CV: RRR without murmur or gallop Abd: soft, NT Ext: no C/C/E, distal pulses intact and equal Skin: warm/dry no rash   Lab Results:  Recent Labs  02/22/14 0632 02/22/14 1200  WBC 8.6 8.4  HGB 12.1 11.5*  PLT 278 256    Recent Labs  02/22/14 0632 02/22/14 1310  NA 140 139  K 3.9 3.9  CL 104 103  CO2 23 25  GLUCOSE 283* 281*  BUN 7 6  CREATININE 0.36* 0.36*    Recent Labs  02/22/14 1919 02/22/14 2344  TROPONINI <0.30 <0.30    Cardiac Studies: Troponins negative, EKG within normal limits  Tele: Sinus rhythm  Assessment/Plan:  1. Atypical chest pain 2. Type 2 DM 3. Obesity 4. Tobacco abuse 5. Social stressors  D/C IV NTG this am. Home today. Outpatient Myoview. No med changes recommended. F/U Dr Gwenlyn Found.   Sherren Mocha, M.D. 02/23/2014, 8:28 AM

## 2014-02-23 NOTE — Progress Notes (Signed)
Pt discharged home with husband.  Assessment unchanged from this morning.  Reviewed discharge documents, instructions and education, all questions answered.

## 2014-02-23 NOTE — Progress Notes (Signed)
UR Completed Brandyn Thien Graves-Bigelow, RN,BSN [phone]  

## 2014-02-26 ENCOUNTER — Ambulatory Visit (HOSPITAL_COMMUNITY): Payer: Self-pay

## 2014-02-28 ENCOUNTER — Ambulatory Visit (HOSPITAL_COMMUNITY): Payer: Self-pay

## 2014-03-01 ENCOUNTER — Telehealth (HOSPITAL_COMMUNITY): Payer: Self-pay

## 2014-03-02 ENCOUNTER — Ambulatory Visit (HOSPITAL_COMMUNITY): Payer: Self-pay

## 2014-03-05 ENCOUNTER — Ambulatory Visit (HOSPITAL_COMMUNITY): Payer: Self-pay

## 2014-03-05 ENCOUNTER — Encounter: Payer: Self-pay | Admitting: Internal Medicine

## 2014-03-05 ENCOUNTER — Ambulatory Visit (INDEPENDENT_AMBULATORY_CARE_PROVIDER_SITE_OTHER): Payer: BC Managed Care – PPO | Admitting: Internal Medicine

## 2014-03-05 VITALS — BP 124/80 | HR 98 | Temp 98.1°F | Resp 16 | Ht 64.0 in | Wt 240.8 lb

## 2014-03-05 DIAGNOSIS — I2510 Atherosclerotic heart disease of native coronary artery without angina pectoris: Secondary | ICD-10-CM

## 2014-03-05 DIAGNOSIS — I70219 Atherosclerosis of native arteries of extremities with intermittent claudication, unspecified extremity: Secondary | ICD-10-CM

## 2014-03-05 DIAGNOSIS — E1165 Type 2 diabetes mellitus with hyperglycemia: Secondary | ICD-10-CM

## 2014-03-05 DIAGNOSIS — F17200 Nicotine dependence, unspecified, uncomplicated: Secondary | ICD-10-CM

## 2014-03-05 DIAGNOSIS — IMO0001 Reserved for inherently not codable concepts without codable children: Secondary | ICD-10-CM

## 2014-03-05 DIAGNOSIS — I2583 Coronary atherosclerosis due to lipid rich plaque: Secondary | ICD-10-CM

## 2014-03-05 DIAGNOSIS — E785 Hyperlipidemia, unspecified: Secondary | ICD-10-CM

## 2014-03-05 DIAGNOSIS — M545 Low back pain, unspecified: Secondary | ICD-10-CM

## 2014-03-05 MED ORDER — TRAMADOL HCL 50 MG PO TABS: 100.0000 mg | ORAL_TABLET | Freq: Three times a day (TID) | ORAL | Status: AC | PRN

## 2014-03-05 MED ORDER — INSULIN GLARGINE 300 UNIT/ML ~~LOC~~ SOPN: 30.0000 [IU] | PEN_INJECTOR | Freq: Every day | SUBCUTANEOUS | Status: DC

## 2014-03-05 MED ORDER — VARENICLINE TARTRATE 0.5 MG X 11 & 1 MG X 42 PO MISC: ORAL | Status: DC

## 2014-03-05 MED ORDER — SITAGLIP PHOS-METFORMIN HCL ER 50-1000 MG PO TB24: 2.0000 | ORAL_TABLET | Freq: Every day | ORAL | Status: DC

## 2014-03-05 MED ORDER — VARENICLINE TARTRATE 1 MG PO TABS: 1.0000 mg | ORAL_TABLET | Freq: Two times a day (BID) | ORAL | Status: DC

## 2014-03-05 NOTE — Progress Notes (Signed)
Subjective:    Patient ID: Maureen Price, female    DOB: 11-17-1975, 38 y.o.   MRN: [id number]  Diabetes She presents for her follow-up diabetic visit. She has type 2 diabetes mellitus. Her disease course has been worsening. There are no hypoglycemic associated symptoms. Pertinent negatives for hypoglycemia include no dizziness, headaches or tremors. Associated symptoms include polydipsia, polyphagia and polyuria. Pertinent negatives for diabetes include no blurred vision, no chest pain, no fatigue, no foot paresthesias, no foot ulcerations, no visual change, no weakness and no weight loss. There are no hypoglycemic complications. Symptoms are worsening. Diabetic complications include heart disease and PVD. Current diabetic treatment includes oral agent (dual therapy). She is compliant with treatment most of the time. Her weight is stable. She is following a generally healthy diet. Meal planning includes avoidance of concentrated sweets. She participates in exercise intermittently. Her home blood glucose trend is increasing steadily. Her breakfast blood glucose range is generally >200 mg/dl. Her lunch blood glucose range is generally >200 mg/dl. Her dinner blood glucose range is generally >200 mg/dl. Her highest blood glucose is >200 mg/dl. Her overall blood glucose range is >200 mg/dl. An ACE inhibitor/angiotensin II receptor blocker is not being taken. She does not see a podiatrist.Eye exam is not current.      Review of Systems  Constitutional: Negative.  Negative for fever, chills, weight loss, diaphoresis, appetite change and fatigue.  HENT: Negative.   Eyes: Negative.  Negative for blurred vision.  Respiratory: Negative.  Negative for apnea, cough, choking, chest tightness, shortness of breath, wheezing and stridor.   Cardiovascular: Negative.  Negative for chest pain, palpitations and leg swelling.  Gastrointestinal: Negative.  Negative for nausea, vomiting, abdominal pain, diarrhea,  constipation and blood in stool.  Endocrine: Positive for polydipsia, polyphagia and polyuria. Negative for cold intolerance and heat intolerance.  Genitourinary: Negative.   Musculoskeletal: Positive for back pain. Negative for arthralgias, gait problem, joint swelling, myalgias, neck pain and neck stiffness.  Skin: Negative.  Negative for rash.  Allergic/Immunologic: Negative.   Neurological: Negative.  Negative for dizziness, tremors, weakness, light-headedness, numbness and headaches.  Hematological: Negative.  Negative for adenopathy. Does not bruise/bleed easily.  Psychiatric/Behavioral: Negative.        Objective:   Physical Exam  Vitals reviewed. Constitutional: She is oriented to person, place, and time. She appears well-developed and well-nourished. No distress.  HENT:  Head: Normocephalic and atraumatic.  Mouth/Throat: Oropharynx is clear and moist. No oropharyngeal exudate.  Eyes: Conjunctivae are normal. Right eye exhibits no discharge. Left eye exhibits no discharge. No scleral icterus.  Neck: Normal range of motion. Neck supple. No JVD present. No tracheal deviation present. No thyromegaly present.  Cardiovascular: Normal rate, regular rhythm, normal heart sounds and intact distal pulses.  Exam reveals no gallop and no friction rub.   No murmur heard. Pulses:      Carotid pulses are 1+ on the right side, and 1+ on the left side.      Radial pulses are 1+ on the right side, and 1+ on the left side.       Femoral pulses are 1+ on the right side, and 1+ on the left side.      Popliteal pulses are 1+ on the right side, and 1+ on the left side.       Dorsalis pedis pulses are 0 on the right side, and 0 on the left side.       Posterior tibial pulses are 1+ on the  right side, and 1+ on the left side.  Pulmonary/Chest: Effort normal and breath sounds normal. No stridor. No respiratory distress. She has no wheezes. She has no rales. She exhibits no tenderness.  Abdominal: Soft.  Bowel sounds are normal. She exhibits no distension and no mass. There is no tenderness. There is no rebound and no guarding.  Musculoskeletal: Normal range of motion. She exhibits no edema and no tenderness.  Lymphadenopathy:    She has no cervical adenopathy.  Neurological: She is oriented to person, place, and time.  Skin: Skin is warm and dry. No rash noted. She is not diaphoretic. No erythema. No pallor.     Lab Results  Component Value Date   WBC 8.4 02/22/2014   HGB 11.5* 02/22/2014   HCT 33.4* 02/22/2014   PLT 256 02/22/2014   GLUCOSE 271* 02/23/2014   CHOL 126 02/23/2014   TRIG 156* 02/23/2014   HDL 19* 02/23/2014   LDLCALC 76 02/23/2014   ALT 20 02/22/2014   AST 15 02/22/2014   NA 139 02/23/2014   K 4.4 02/23/2014   CL 101 02/23/2014   CREATININE 0.36* 02/23/2014   BUN 7 02/23/2014   CO2 23 02/23/2014   TSH 1.55 08/25/2013   INR 1.04 10/26/2013   HGBA1C 11.5* 02/22/2014   MICROALBUR 1.1 07/07/2012       Assessment & Plan:

## 2014-03-05 NOTE — Progress Notes (Signed)
Pre visit review using our clinic review tool, if applicable. No additional management support is needed unless otherwise documented below in the visit note. 

## 2014-03-05 NOTE — Patient Instructions (Signed)
Type 2 Diabetes Mellitus, Adult Type 2 diabetes mellitus, often simply referred to as type 2 diabetes, is a long-lasting (chronic) disease. In type 2 diabetes, the pancreas does not make enough insulin (a hormone), the cells are less responsive to the insulin that is made (insulin resistance), or both. Normally, insulin moves sugars from food into the tissue cells. The tissue cells use the sugars for energy. The lack of insulin or the lack of normal response to insulin causes excess sugars to build up in the blood instead of going into the tissue cells. As a result, high blood sugar (hyperglycemia) develops. The effect of high sugar (glucose) levels can cause many complications. Type 2 diabetes was also previously called adult-onset diabetes but it can occur at any age.  RISK FACTORS  A person is predisposed to developing type 2 diabetes if someone in the family has the disease and also has one or more of the following primary risk factors:  Overweight.  An inactive lifestyle.  A history of consistently eating high-calorie foods. Maintaining a normal weight and regular physical activity can reduce the chance of developing type 2 diabetes. SYMPTOMS  A person with type 2 diabetes may not show symptoms initially. The symptoms of type 2 diabetes appear slowly. The symptoms include:  Increased thirst (polydipsia).  Increased urination (polyuria).  Increased urination during the night (nocturia).  Weight loss. This weight loss may be rapid.  Frequent, recurring infections.  Tiredness (fatigue).  Weakness.  Vision changes, such as blurred vision.  Fruity smell to your breath.  Abdominal pain.  Nausea or vomiting.  Cuts or bruises which are slow to heal.  Tingling or numbness in the hands or feet. DIAGNOSIS Type 2 diabetes is frequently not diagnosed until complications of diabetes are present. Type 2 diabetes is diagnosed when symptoms or complications are present and when blood  glucose levels are increased. Your blood glucose level may be checked by one or more of the following blood tests:  A fasting blood glucose test. You will not be allowed to eat for at least 8 hours before a blood sample is taken.  A random blood glucose test. Your blood glucose is checked at any time of the day regardless of when you ate.  A hemoglobin A1c blood glucose test. A hemoglobin A1c test provides information about blood glucose control over the previous 3 months.  An oral glucose tolerance test (OGTT). Your blood glucose is measured after you have not eaten (fasted) for 2 hours and then after you drink a glucose-containing beverage. TREATMENT   You may need to take insulin or diabetes medicine daily to keep blood glucose levels in the desired range.  If you use insulin, you may need to adjust the dosage depending on the carbohydrates that you eat with each meal or snack. The treatment goal is to maintain the before meal blood sugar (preprandial glucose) level at 70-130 mg/dL. HOME CARE INSTRUCTIONS   Have your hemoglobin A1c level checked twice a year.  Perform daily blood glucose monitoring as directed by your health care Axton Cihlar.  Monitor urine ketones when you are ill and as directed by your health care Miguelina Fore.  Take your diabetes medicine or insulin as directed by your health care Tempestt Silba to maintain your blood glucose levels in the desired range.  Never run out of diabetes medicine or insulin. It is needed every day.  If you are using insulin, you may need to adjust the amount of insulin given based on your intake   of carbohydrates. Carbohydrates can raise blood glucose levels but need to be included in your diet. Carbohydrates provide vitamins, minerals, and fiber which are an essential part of a healthy diet. Carbohydrates are found in fruits, vegetables, whole grains, dairy products, legumes, and foods containing added sugars.  Eat healthy foods. You should make an  appointment to see a registered dietitian to help you create an eating plan that is right for you.  Lose weight if overweight.  Carry a medical alert card or wear your medical alert jewelry.  Carry a 15 gram carbohydrate snack with you at all times to treat low blood glucose (hypoglycemia). Some examples of 15 gram carbohydrate snacks include:  Glucose tablets, 3 or 4  Raisins, 2 tablespoons (24 grams)  Jelly beans, 6  Animal crackers, 8  Regular pop, 4 ounces (120 mL)  Gummy treats, 9  Recognize hypoglycemia. Hypoglycemia occurs with blood glucose levels of 70 mg/dL and below. The risk for hypoglycemia increases when fasting or skipping meals, during or after intense exercise, and during sleep. Hypoglycemia symptoms can include:  Tremors or shakes.  Decreased ability to concentrate.  Sweating.  Increased heart rate.  Headache.  Dry mouth.  Hunger.  Irritability.  Anxiety.  Restless sleep.  Altered speech or coordination.  Confusion.  Treat hypoglycemia promptly. If you are alert and able to safely swallow, follow the 15:15 rule:  Take 15-20 grams of rapid-acting glucose or carbohydrate. Rapid-acting options include glucose gel, glucose tablets, or 4 ounces (120 mL) of fruit juice, regular soda, or low fat milk.  Check your blood glucose level 15 minutes after taking the glucose.  Take 15-20 grams more of glucose if the repeat blood glucose level is still 70 mg/dL or below.  Eat a meal or snack within 1 hour once blood glucose levels return to normal.  Be alert to feeling very thirsty and urinating more frequently than usual, which are early signs of hyperglycemia. An early awareness of hyperglycemia allows for prompt treatment. Treat hyperglycemia as directed by your health care Krithik Mapel.  Engage in at least 150 minutes of moderate-intensity physical activity a week, spread over at least 3 days of the week or as directed by your health care Vieva Brummitt. In  addition, you should engage in resistance exercise at least 2 times a week or as directed by your health care Saliha Salts.  Adjust your medicine and food intake as needed if you start a new exercise or sport.  Follow your sick day plan at any time you are unable to eat or drink as usual.  Avoid tobacco use.  Limit alcohol intake to no more than 1 drink per day for nonpregnant women and 2 drinks per day for men. You should drink alcohol only when you are also eating food. Talk with your health care Jacquel Mccamish whether alcohol is safe for you. Tell your health care Hung Rhinesmith if you drink alcohol several times a week.  Follow up with your health care Wilian Kwong regularly.  Schedule an eye exam soon after the diagnosis of type 2 diabetes and then annually.  Perform daily skin and foot care. Examine your skin and feet daily for cuts, bruises, redness, nail problems, bleeding, blisters, or sores. A foot exam by a health care Cadance Raus should be done annually.  Brush your teeth and gums at least twice a day and floss at least once a day. Follow up with your dentist regularly.  Share your diabetes management plan with your workplace or school.  Stay up-to-date with   immunizations.  Learn to manage stress.  Obtain ongoing diabetes education and support as needed.  Participate in, or seek rehabilitation as needed to maintain or improve independence and quality of life. Request a physical or occupational therapy referral if you are having foot or hand numbness or difficulties with grooming, dressing, eating, or physical activity. SEEK MEDICAL CARE IF:   You are unable to eat food or drink fluids for more than 6 hours.  You have nausea and vomiting for more than 6 hours.  Your blood glucose level is over 240 mg/dL.  There is a change in mental status.  You develop an additional serious illness.  You have diarrhea for more than 6 hours.  You have been sick or have had a fever for a couple of days  and are not getting better.  You have pain during any physical activity.  SEEK IMMEDIATE MEDICAL CARE IF:  You have difficulty breathing.  You have moderate to large ketone levels. MAKE SURE YOU:  Understand these instructions.  Will watch your condition.  Will get help right away if you are not doing well or get worse. Document Released: 08/24/2005 Document Revised: 08/29/2013 Document Reviewed: 03/22/2012 [employer] Patient Information 2015 [employer]. This information is not intended to replace advice given to you by your health care Dailen Mcclish. Make sure you discuss any questions you have with your health care Ruth Tully.  

## 2014-03-06 ENCOUNTER — Ambulatory Visit (HOSPITAL_COMMUNITY)
Admission: RE | Admit: 2014-03-06 | Discharge: 2014-03-06 | Disposition: A | Discharge status: Home / Self Care | Payer: BC Managed Care – PPO | Source: Ambulatory Visit | Attending: Cardiovascular Disease | Admitting: Cardiovascular Disease

## 2014-03-06 ENCOUNTER — Telehealth: Payer: Self-pay | Admitting: *Deleted

## 2014-03-06 ENCOUNTER — Encounter: Payer: Self-pay | Admitting: Internal Medicine

## 2014-03-06 DIAGNOSIS — R0989 Other specified symptoms and signs involving the circulatory and respiratory systems: Secondary | ICD-10-CM | POA: Insufficient documentation

## 2014-03-06 DIAGNOSIS — R5383 Other fatigue: Secondary | ICD-10-CM

## 2014-03-06 DIAGNOSIS — I208 Other forms of angina pectoris: Secondary | ICD-10-CM

## 2014-03-06 DIAGNOSIS — R079 Chest pain, unspecified: Secondary | ICD-10-CM | POA: Insufficient documentation

## 2014-03-06 DIAGNOSIS — Z9861 Coronary angioplasty status: Secondary | ICD-10-CM | POA: Insufficient documentation

## 2014-03-06 DIAGNOSIS — I209 Angina pectoris, unspecified: Secondary | ICD-10-CM

## 2014-03-06 DIAGNOSIS — R5381 Other malaise: Secondary | ICD-10-CM | POA: Insufficient documentation

## 2014-03-06 DIAGNOSIS — R0609 Other forms of dyspnea: Secondary | ICD-10-CM | POA: Insufficient documentation

## 2014-03-06 DIAGNOSIS — R42 Dizziness and giddiness: Secondary | ICD-10-CM | POA: Insufficient documentation

## 2014-03-06 DIAGNOSIS — I252 Old myocardial infarction: Secondary | ICD-10-CM | POA: Insufficient documentation

## 2014-03-06 DIAGNOSIS — I2510 Atherosclerotic heart disease of native coronary artery without angina pectoris: Secondary | ICD-10-CM | POA: Insufficient documentation

## 2014-03-06 DIAGNOSIS — R002 Palpitations: Secondary | ICD-10-CM | POA: Insufficient documentation

## 2014-03-06 MED ORDER — TECHNETIUM TC 99M SESTAMIBI GENERIC - CARDIOLITE
10.0000 | Freq: Once | INTRAVENOUS | Status: AC | PRN
  Administered 2014-03-06: 10 via INTRAVENOUS

## 2014-03-06 MED ORDER — REGADENOSON 0.4 MG/5ML IV SOLN
0.4000 mg | Freq: Once | INTRAVENOUS | Status: AC
  Administered 2014-03-06: 0.4 mg via INTRAVENOUS

## 2014-03-06 MED ORDER — TECHNETIUM TC 99M SESTAMIBI GENERIC - CARDIOLITE
30.0000 | Freq: Once | INTRAVENOUS | Status: AC | PRN
  Administered 2014-03-06: 30 via INTRAVENOUS

## 2014-03-06 MED ORDER — AMINOPHYLLINE 25 MG/ML IV SOLN
75.0000 mg | Freq: Once | INTRAVENOUS | Status: AC
  Administered 2014-03-06: 75 mg via INTRAVENOUS

## 2014-03-06 NOTE — Assessment & Plan Note (Signed)
She will take tramadol as needed for LBP

## 2014-03-06 NOTE — Assessment & Plan Note (Signed)
She is motivated to quit She will start chantix

## 2014-03-06 NOTE — Assessment & Plan Note (Signed)
Her blood sugars are not well controlled She will stay on janumet and will add Toujeo - first dose given in the office today

## 2014-03-06 NOTE — Telephone Encounter (Signed)
Left msg on triage [facility] did not received that tramadol script. Called [facility] spoke with donnie/pharmacist he stated they didn't received. Gave md authorization. Notified pt rx was call into [facility]...Maureen Price

## 2014-03-06 NOTE — Assessment & Plan Note (Signed)
I have ordered arterial flow studies to see how severe this is She will quit smoking

## 2014-03-06 NOTE — Procedures (Addendum)
[facility] [address] [place] [address] [phone]  Cardiology Nuclear Med Study  Maureen Price is a 38 y.o. female     MRN : [id number]     DOB: 08-12-1976  Procedure Date: 03/06/2014  Nuclear Med Background Indication for Stress Test:  [facility] History:  CAD;MI-10/26/2013;STRNT/PTCA-10/2013;No prior NUC MPI for comparison. Cardiac Risk Factors: Family History - CAD, Lipids, NIDDM, Obesity and Smoker  Symptoms:  Chest Pain, Dizziness, DOE, Fatigue, Light-Headedness and Palpitations   Nuclear Pre-Procedure Caffeine/Decaff Intake:  7:00pm NPO After: 5:00am   IV Site: R Forearm  IV 0.9% NS with Angio Cath:  22g  Chest Size (in):  n/a IV Started by: Rolene Course, RN  Height: 5\' 4"  (1.626 m)  Cup Size: DDD  BMI:  Body mass index is 40.32 kg/(m^2). Weight:  235 lb (106.595 kg)   Tech Comments:  n/a    Nuclear Med Study 1 or 2 day study: 1 day  Stress Test Type:  [place] [name]:  Quay Burow, MD   Resting Radionuclide: Technetium 74m Sestamibi  Resting Radionuclide Dose: 10.1 mCi   Stress Radionuclide:  Technetium 41m Sestamibi  Stress Radionuclide Dose: 30.1 mCi           Stress Protocol Rest HR: 79 Stress HR: 95  Rest BP: 119/76 Stress BP: 115/59  Exercise Time (min): n/a METS: n/a   Predicted Max HR: 183 bpm % Max HR: 52.46 bpm Rate Pressure Product: 11904  Dose of Adenosine (mg):  n/a Dose of Lexiscan: 0.4 mg  Dose of Atropine (mg): n/a Dose of Dobutamine: n/a mcg/kg/min (at max HR)  Stress Test Technologist: Leane Para, CCT Nuclear Technologist: Otho Perl, CNMT   Rest Procedure:  Myocardial perfusion imaging was performed at rest 45 minutes following the intravenous administration of Technetium 4m Sestamibi. Stress Procedure:  The patient received IV Lexiscan 0.4 mg over 15-seconds.  Technetium 37m Sestamibi injected IV at 30-seconds.  Patient experienced  SOB, Chest Pain, Nausea and stomach pain and 75 mg of Aminophylline IV was administered.  There were no significant changes with Lexiscan.  Quantitative spect images were obtained after a 45 minute delay.  Transient Ischemic Dilatation (Normal <1.22):  1.03 QGS EDV:  82 ml QGS ESV:  42 ml LV Ejection Fraction: 49%  Rest ECG: NSR - Normal EKG  Stress ECG: No significant change from baseline ECG  QPS Raw Data Images:  Normal; no motion artifact; normal heart/lung ratio. Stress Images:  There is decreased uptake in the anterior wall. Rest Images:  Normal homogeneous uptake in all areas of the myocardium. Subtraction (SDS):  These findings are consistent with ischemia.  Impression Exercise Capacity:  Lexiscan with no exercise. BP Response:  Normal blood pressure response. Clinical Symptoms:  Dyspnea, chest pain, nausea ECG Impression:  No significant ECG changes with Lexiscan. Comparison with Prior Nuclear Study: No previous nuclear study performed  Overall Impression:  High risk stress nuclear study with reversible anterior ischemia - SDS 9. Extent 15%.  LV Wall Motion:  Anterior hypokinesis. LVEF 49%.  Pixie Casino, MD, [facility] Board Certified in Nuclear Cardiology Attending Cardiologist [place], MD  03/06/2014 12:49 PM

## 2014-03-06 NOTE — Assessment & Plan Note (Signed)
She has achieved her LDL goal 

## 2014-03-07 ENCOUNTER — Telehealth: Payer: Self-pay | Admitting: *Deleted

## 2014-03-07 ENCOUNTER — Ambulatory Visit (HOSPITAL_COMMUNITY): Payer: Self-pay

## 2014-03-07 ENCOUNTER — Ambulatory Visit (HOSPITAL_COMMUNITY)
Admission: RE | Admit: 2014-03-07 | Discharge: 2014-03-07 | Disposition: A | Discharge status: Home / Self Care | Payer: BC Managed Care – PPO | Source: Ambulatory Visit | Attending: Cardiovascular Disease | Admitting: Cardiovascular Disease

## 2014-03-07 DIAGNOSIS — R0989 Other specified symptoms and signs involving the circulatory and respiratory systems: Secondary | ICD-10-CM

## 2014-03-07 DIAGNOSIS — I70219 Atherosclerosis of native arteries of extremities with intermittent claudication, unspecified extremity: Secondary | ICD-10-CM | POA: Insufficient documentation

## 2014-03-07 NOTE — Progress Notes (Signed)
Arterial Duplex Lower Ext. Completed. Normal ABIs with no evidence of stenosis.  Oda Cogan, BS, RDMS, RVT

## 2014-03-07 NOTE — Telephone Encounter (Signed)
Pt. Given 4 bottles of brilinta  90 mg   samples

## 2014-03-09 ENCOUNTER — Ambulatory Visit (HOSPITAL_COMMUNITY): Payer: Self-pay

## 2014-03-12 ENCOUNTER — Ambulatory Visit (HOSPITAL_COMMUNITY): Payer: Self-pay

## 2014-03-14 ENCOUNTER — Ambulatory Visit (HOSPITAL_COMMUNITY): Payer: Self-pay

## 2014-03-16 ENCOUNTER — Ambulatory Visit (HOSPITAL_COMMUNITY): Payer: Self-pay

## 2014-03-16 ENCOUNTER — Encounter: Payer: Self-pay | Admitting: Cardiovascular Disease

## 2014-03-16 ENCOUNTER — Ambulatory Visit (INDEPENDENT_AMBULATORY_CARE_PROVIDER_SITE_OTHER): Payer: BC Managed Care – PPO | Admitting: Cardiovascular Disease

## 2014-03-16 VITALS — BP 126/85 | HR 97 | Ht 64.0 in | Wt 240.4 lb

## 2014-03-16 DIAGNOSIS — D689 Coagulation defect, unspecified: Secondary | ICD-10-CM

## 2014-03-16 DIAGNOSIS — E785 Hyperlipidemia, unspecified: Secondary | ICD-10-CM

## 2014-03-16 DIAGNOSIS — I2109 ST elevation (STEMI) myocardial infarction involving other coronary artery of anterior wall: Secondary | ICD-10-CM

## 2014-03-16 DIAGNOSIS — Z79899 Other long term (current) drug therapy: Secondary | ICD-10-CM

## 2014-03-16 MED ORDER — ATORVASTATIN CALCIUM 80 MG PO TABS: 80.0000 mg | ORAL_TABLET | Freq: Every day | ORAL | Status: DC

## 2014-03-16 NOTE — Progress Notes (Signed)
03/16/2014 Maureen Price   July 03, 1976  [id number]  Primary Physician [name], MD Primary Cardiologist: Lorretta Harp MD Renae Gloss   HPI:  Maureen Price is a 38 year old moderately overweight married African American female mother of 3 children he works as a [employer] manager. Her primary care physician is Dr. [name]. She suffered an interval myocardial infarction 10/24/13 treated with direct angioplasty using a drug-eluting stent by myself. Her affect is in continued tobacco abuse, diabetes and hyperlipidemia. She had 2 admissions for chest pain rule out myocardial infarction in April and June of this year. A Myoview stress test performed 03/06/14 showed severe apical ischemia currently she is asymptomatic.   Current Outpatient Prescriptions  Medication Sig Dispense Refill  . acetaminophen (TYLENOL) 500 MG tablet Take 1,000 mg by mouth every 6 (six) hours as needed for mild pain.      Marland Kitchen aspirin EC 81 MG EC tablet Take 1 tablet (81 mg total) by mouth daily.      Marland Kitchen atorvastatin (LIPITOR) 80 MG tablet Take 1 tablet (80 mg total) by mouth daily at 6 PM.  30 tablet  6  . Insulin Glargine (TOUJEO SOLOSTAR) 300 UNIT/ML SOPN Inject 30 Units into the skin daily.  1.5 mL  11  . metoprolol tartrate (LOPRESSOR) 25 MG tablet Take 0.5 tablets (12.5 mg total) by mouth 2 (two) times daily.  30 tablet  6  . Multiple Vitamins-Iron (MULTIVITAMINS WITH IRON) TABS tablet Take 1 tablet by mouth daily.    0  . nitroGLYCERIN (NITROSTAT) 0.4 MG SL tablet Place 1 tablet (0.4 mg total) under the tongue every 5 (five) minutes x 3 doses as needed for chest pain.  25 tablet  4  . SitaGLIPtin-MetFORMIN HCl (JANUMET XR) 50-1000 MG TB24 Take 2 tablets by mouth daily.  180 tablet  1  . ticagrelor (BRILINTA) 90 MG TABS tablet Take 1 tablet (90 mg total) by mouth 2 (two) times daily.  40 tablet  0  . varenicline (CHANTIX CONTINUING MONTH PAK) 1 MG tablet Take 1 tablet (1 mg total) by mouth 2 (two) times  daily.  60 tablet  3  . varenicline (CHANTIX STARTING MONTH PAK) 0.5 MG X 11 & 1 MG X 42 tablet Take one 0.5 mg tablet by mouth once daily for 3 days, then increase to one 0.5 mg tablet twice daily for 4 days, then increase to one 1 mg tablet twice daily.  53 tablet  0   No current facility-administered medications for this visit.    Allergies  Allergen Reactions  . Erythromycin Anaphylaxis  . Penicillins Anaphylaxis  . Viibryd [Vilazodone Hcl]     Altered mental status.      History   Social History  . Marital Status: Married    Spouse Name: N/A    Number of Children: N/A  . Years of Education: N/A   Occupational History  . Not on file.   Social History Main Topics  . Smoking status: Current Every Day Smoker -- 0.50 packs/day for 20 years    Types: Cigarettes  . Smokeless tobacco: Never Used  . Alcohol Use: No     Comment: occasional  . Drug Use: No  . Sexual Activity: Yes    Birth Control/ Protection: Surgical   Other Topics Concern  . Not on file   Social History Narrative   Caffienated drinks- yes   Seat belt use often- yes   Regular Exercise- yes   Smoke alarm in the  home- yes   Firearms/guns in the home- no   History of physical abuse- no              Review of Systems: General: negative for chills, fever, night sweats or weight changes.  Cardiovascular: negative for chest pain, dyspnea on exertion, edema, orthopnea, palpitations, paroxysmal nocturnal dyspnea or shortness of breath Dermatological: negative for rash Respiratory: negative for cough or wheezing Urologic: negative for hematuria Abdominal: negative for nausea, vomiting, diarrhea, bright red blood per rectum, melena, or hematemesis Neurologic: negative for visual changes, syncope, or dizziness All other systems reviewed and are otherwise negative except as noted above.    Blood pressure 126/85, pulse 97, height 5\' 4"  (1.626 m), weight 240 lb 6.4 oz (109.045 kg), last menstrual period  02/23/2014.  General appearance: alert and no distress Neck: no adenopathy, no carotid bruit, no JVD, supple, symmetrical, trachea midline and thyroid not enlarged, symmetric, no tenderness/mass/nodules Lungs: clear to auscultation bilaterally Heart: regular rate and rhythm, S1, S2 normal, no murmur, click, rub or gallop Extremities: extremities normal, atraumatic, no cyanosis or edema  EKG not performed today  ASSESSMENT AND PLAN:   STEMI (ST elevation myocardial infarction), PTCA/Stent DES- Xience Alpine- LAD, large burden thrombus in LAD and RCA  Status post anterior ST segment elevation myocardial infarction 10/24/13 to PCI and stenting by myself using a drug-eluting stent. Her EF was 45% with moderate anterolateral apical hypokinesia. I did note a filling defect in her large dominant RCA and treated with Aggrastat for 18 hours. Several days later cath demonstrating this to do that to have resolved. She had a chest pain rule out myocardial infarction admission in the April and again in June. She had a Myoview stress test performed June 30 that was read as high risk with anteroapical ischemia. She however denies chest pain or shortness of breath at this time. Based on the severity of her ischemic abnormality I feel compelled to perform cardiac catheterization via the right radial approach to demonstrate patency of LAD stent  Dyslipidemia, goal LDL below 70 The patient has not been taking a statin drug. I provided her with a prescription. Also talked about appropriate heart  healthy diet.      Lorretta Harp MD FACP,FACC,FAHA, [facility] 03/16/2014 4:45 PM

## 2014-03-16 NOTE — Patient Instructions (Signed)
Your physician has requested that you have a cardiac catheterization (radial). Cardiac catheterization is used to diagnose and/or treat various heart conditions. Doctors may recommend this procedure for a number of different reasons. The most common reason is to evaluate chest pain. Chest pain can be a symptom of coronary artery disease (CAD), and cardiac catheterization can show whether plaque is narrowing or blocking your heart's arteries. This procedure is also used to evaluate the valves, as well as measure the blood flow and oxygen levels in different parts of your heart. For further information please visit [email or web address].   Following your catheterization, you will not be allowed to drive for 3 days.  No lifting, pushing, or pulling greater that 10 pounds is allowed for 1 week.  You will be required to have the following tests prior to the procedure:  1. Blood work-the blood work can be done no more than 7 days prior to the procedure.  It can be done at any [facility] lab.  There is one downstairs on the first floor of this building and one in the [place] ([address])  Restart the atorvastatin 80mg  daily

## 2014-03-16 NOTE — Assessment & Plan Note (Signed)
The patient has not been taking a statin drug. I provided her with a prescription. Also talked about appropriate heart  healthy diet.

## 2014-03-16 NOTE — Assessment & Plan Note (Signed)
Status post anterior ST segment elevation myocardial infarction 10/24/13 to PCI and stenting by myself using a drug-eluting stent. Her EF was 45% with moderate anterolateral apical hypokinesia. I did note a filling defect in her large dominant RCA and treated with Aggrastat for 18 hours. Several days later cath demonstrating this to do that to have resolved. She had a chest pain rule out myocardial infarction admission in the April and again in June. She had a Myoview stress test performed June 30 that was read as high risk with anteroapical ischemia. She however denies chest pain or shortness of breath at this time. Based on the severity of her ischemic abnormality I feel compelled to perform cardiac catheterization via the right radial approach to demonstrate patency of LAD stent

## 2014-03-19 ENCOUNTER — Ambulatory Visit (HOSPITAL_COMMUNITY): Payer: Self-pay

## 2014-03-20 ENCOUNTER — Ambulatory Visit: Payer: BC Managed Care – PPO | Admitting: Internal Medicine

## 2014-03-21 ENCOUNTER — Ambulatory Visit (HOSPITAL_COMMUNITY): Payer: Self-pay

## 2014-03-21 NOTE — Telephone Encounter (Signed)
Encounter complete. 

## 2014-03-23 ENCOUNTER — Inpatient Hospital Stay (HOSPITAL_COMMUNITY): Admission: RE | Admit: 2014-03-23 | Payer: Self-pay | Source: Ambulatory Visit

## 2014-03-24 LAB — CBC
HEMATOCRIT: 37.7 % (ref 36.0–46.0)
HEMOGLOBIN: 12.9 g/dL (ref 12.0–15.0)
MCH: 26.8 pg (ref 26.0–34.0)
MCHC: 34.2 g/dL (ref 30.0–36.0)
MCV: 78.4 fL (ref 78.0–100.0)
Platelets: 279 10*3/uL (ref 150–400)
RBC: 4.81 MIL/uL (ref 3.87–5.11)
RDW: 15 % (ref 11.5–15.5)
WBC: 9.7 10*3/uL (ref 4.0–10.5)

## 2014-03-24 LAB — BASIC METABOLIC PANEL
BUN: 8 mg/dL (ref 6–23)
CHLORIDE: 101 meq/L (ref 96–112)
CO2: 26 meq/L (ref 19–32)
CREATININE: 0.54 mg/dL (ref 0.50–1.10)
Calcium: 8.7 mg/dL (ref 8.4–10.5)
Glucose, Bld: 261 mg/dL — ABNORMAL HIGH (ref 70–99)
POTASSIUM: 3.9 meq/L (ref 3.5–5.3)
Sodium: 135 mEq/L (ref 135–145)

## 2014-03-24 LAB — PROTIME-INR
INR: 1.09 (ref <1.50)
PROTHROMBIN TIME: 14.1 s (ref 11.6–15.2)

## 2014-03-24 LAB — APTT: APTT: 29 s (ref 24–37)

## 2014-03-26 ENCOUNTER — Encounter: Payer: Self-pay | Admitting: Internal Medicine

## 2014-03-28 ENCOUNTER — Encounter (HOSPITAL_COMMUNITY): Payer: Self-pay | Admitting: Pharmacy Technician

## 2014-03-29 ENCOUNTER — Encounter (HOSPITAL_COMMUNITY)
Admission: RE | Disposition: A | Discharge status: Home / Self Care | Payer: Self-pay | Source: Ambulatory Visit | Attending: Cardiovascular Disease

## 2014-03-29 ENCOUNTER — Ambulatory Visit (HOSPITAL_COMMUNITY)
Admission: RE | Admit: 2014-03-29 | Discharge: 2014-03-29 | Disposition: A | Discharge status: Home / Self Care | Payer: BC Managed Care – PPO | Source: Ambulatory Visit | Attending: Cardiovascular Disease | Admitting: Cardiovascular Disease

## 2014-03-29 DIAGNOSIS — I2584 Coronary atherosclerosis due to calcified coronary lesion: Secondary | ICD-10-CM

## 2014-03-29 DIAGNOSIS — I252 Old myocardial infarction: Secondary | ICD-10-CM | POA: Insufficient documentation

## 2014-03-29 DIAGNOSIS — Z79899 Other long term (current) drug therapy: Secondary | ICD-10-CM

## 2014-03-29 DIAGNOSIS — R9430 Abnormal result of cardiovascular function study, unspecified: Secondary | ICD-10-CM

## 2014-03-29 DIAGNOSIS — Z7982 Long term (current) use of aspirin: Secondary | ICD-10-CM | POA: Insufficient documentation

## 2014-03-29 DIAGNOSIS — Z6841 Body Mass Index (BMI) 40.0 and over, adult: Secondary | ICD-10-CM | POA: Insufficient documentation

## 2014-03-29 DIAGNOSIS — I70219 Atherosclerosis of native arteries of extremities with intermittent claudication, unspecified extremity: Secondary | ICD-10-CM

## 2014-03-29 DIAGNOSIS — Z794 Long term (current) use of insulin: Secondary | ICD-10-CM | POA: Insufficient documentation

## 2014-03-29 DIAGNOSIS — E119 Type 2 diabetes mellitus without complications: Secondary | ICD-10-CM | POA: Insufficient documentation

## 2014-03-29 DIAGNOSIS — I2510 Atherosclerotic heart disease of native coronary artery without angina pectoris: Secondary | ICD-10-CM | POA: Insufficient documentation

## 2014-03-29 DIAGNOSIS — Z9861 Coronary angioplasty status: Secondary | ICD-10-CM | POA: Insufficient documentation

## 2014-03-29 DIAGNOSIS — E785 Hyperlipidemia, unspecified: Secondary | ICD-10-CM | POA: Insufficient documentation

## 2014-03-29 DIAGNOSIS — I2109 ST elevation (STEMI) myocardial infarction involving other coronary artery of anterior wall: Secondary | ICD-10-CM

## 2014-03-29 DIAGNOSIS — D689 Coagulation defect, unspecified: Secondary | ICD-10-CM

## 2014-03-29 DIAGNOSIS — R0789 Other chest pain: Secondary | ICD-10-CM | POA: Insufficient documentation

## 2014-03-29 DIAGNOSIS — E663 Overweight: Secondary | ICD-10-CM | POA: Insufficient documentation

## 2014-03-29 DIAGNOSIS — F17200 Nicotine dependence, unspecified, uncomplicated: Secondary | ICD-10-CM | POA: Insufficient documentation

## 2014-03-29 HISTORY — PX: LEFT HEART CATHETERIZATION WITH CORONARY ANGIOGRAM: SHX5451

## 2014-03-29 LAB — GLUCOSE, CAPILLARY
GLUCOSE-CAPILLARY: 284 mg/dL — AB (ref 70–99)
Glucose-Capillary: 291 mg/dL — ABNORMAL HIGH (ref 70–99)

## 2014-03-29 SURGERY — LEFT HEART CATHETERIZATION WITH CORONARY ANGIOGRAM
Anesthesia: LOCAL

## 2014-03-29 MED ORDER — MORPHINE SULFATE 2 MG/ML IJ SOLN
2.0000 mg | INTRAMUSCULAR | Status: DC | PRN
  Administered 2014-03-29: 2 mg via INTRAVENOUS

## 2014-03-29 MED ORDER — MORPHINE SULFATE 2 MG/ML IJ SOLN
INTRAMUSCULAR | Status: AC
  Filled 2014-03-29: qty 1

## 2014-03-29 MED ORDER — ASPIRIN 81 MG PO CHEW
CHEWABLE_TABLET | ORAL | Status: AC
  Administered 2014-03-29: 81 mg via ORAL
  Filled 2014-03-29: qty 1

## 2014-03-29 MED ORDER — INSULIN ASPART 100 UNIT/ML ~~LOC~~ SOLN: 0.0000 [IU] | Freq: Three times a day (TID) | SUBCUTANEOUS | Status: DC

## 2014-03-29 MED ORDER — HEPARIN SODIUM (PORCINE) 1000 UNIT/ML IJ SOLN
INTRAMUSCULAR | Status: AC
  Filled 2014-03-29: qty 1

## 2014-03-29 MED ORDER — ONDANSETRON HCL 4 MG/2ML IJ SOLN: 4.0000 mg | Freq: Four times a day (QID) | INTRAMUSCULAR | Status: DC | PRN

## 2014-03-29 MED ORDER — SODIUM CHLORIDE 0.9 % IV SOLN
INTRAVENOUS | Status: DC
  Administered 2014-03-29: 1000 mL via INTRAVENOUS

## 2014-03-29 MED ORDER — FENTANYL CITRATE 0.05 MG/ML IJ SOLN
INTRAMUSCULAR | Status: AC
  Filled 2014-03-29: qty 2

## 2014-03-29 MED ORDER — SODIUM CHLORIDE 0.9 % IJ SOLN: 3.0000 mL | INTRAMUSCULAR | Status: DC | PRN

## 2014-03-29 MED ORDER — INSULIN ASPART 100 UNIT/ML ~~LOC~~ SOLN
SUBCUTANEOUS | Status: AC
  Filled 2014-03-29: qty 1

## 2014-03-29 MED ORDER — HEPARIN (PORCINE) IN NACL 2-0.9 UNIT/ML-% IJ SOLN
INTRAMUSCULAR | Status: AC
  Filled 2014-03-29: qty 1000

## 2014-03-29 MED ORDER — NITROGLYCERIN 1 MG/10 ML FOR IR/CATH LAB
INTRA_ARTERIAL | Status: AC
  Filled 2014-03-29: qty 10

## 2014-03-29 MED ORDER — VERAPAMIL HCL 2.5 MG/ML IV SOLN
INTRAVENOUS | Status: AC
  Filled 2014-03-29: qty 2

## 2014-03-29 MED ORDER — SODIUM CHLORIDE 0.9 % IV SOLN: INTRAVENOUS | Status: AC

## 2014-03-29 MED ORDER — ASPIRIN 81 MG PO CHEW
81.0000 mg | CHEWABLE_TABLET | ORAL | Status: AC
  Administered 2014-03-29: 81 mg via ORAL

## 2014-03-29 MED ORDER — MIDAZOLAM HCL 2 MG/2ML IJ SOLN
INTRAMUSCULAR | Status: AC
  Filled 2014-03-29: qty 2

## 2014-03-29 MED ORDER — ACETAMINOPHEN 325 MG PO TABS: 650.0000 mg | ORAL_TABLET | ORAL | Status: DC | PRN

## 2014-03-29 MED ORDER — TICAGRELOR 90 MG PO TABS: 90.0000 mg | ORAL_TABLET | Freq: Two times a day (BID) | ORAL | Status: DC

## 2014-03-29 MED ORDER — LIDOCAINE HCL (PF) 1 % IJ SOLN
INTRAMUSCULAR | Status: AC
  Filled 2014-03-29: qty 60

## 2014-03-29 MED ORDER — ASPIRIN 81 MG PO CHEW: 81.0000 mg | CHEWABLE_TABLET | Freq: Every day | ORAL | Status: DC

## 2014-03-29 NOTE — Discharge Instructions (Signed)
Radial Site Care Refer to this sheet in the next few weeks. These instructions provide you with information on caring for yourself after your procedure. Your caregiver may also give you more specific instructions. Your treatment has been planned according to current medical practices, but problems sometimes occur. Call your caregiver if you have any problems or questions after your procedure. HOME CARE INSTRUCTIONS  You may shower the day after the procedure.Remove the bandage (dressing) and gently wash the site with plain soap and water.Gently pat the site dry.  Do not apply powder or lotion to the site.  Do not submerge the affected site in water for 3 to 5 days.  Inspect the site at least twice daily.  Do not flex or bend the affected arm for 24 hours.  No lifting over 5 pounds (2.3 kg) for 5 days after your procedure.  Do not drive home if you are discharged the same day of the procedure. Have someone else drive you.  You may drive 24 hours after the procedure unless otherwise instructed by your caregiver.  Do not operate machinery or power tools for 24 hours.  A responsible adult should be with you for the first 24 hours after you arrive home. What to expect:  Any bruising will usually fade within 1 to 2 weeks.  Blood that collects in the tissue (hematoma) may be painful to the touch. It should usually decrease in size and tenderness within 1 to 2 weeks. SEEK IMMEDIATE MEDICAL CARE IF:  You have unusual pain at the radial site.  You have redness, warmth, swelling, or pain at the radial site.  You have drainage (other than a small amount of blood on the dressing).  You have chills.  You have a fever or persistent symptoms for more than 72 hours.  You have a fever and your symptoms suddenly get worse.  Your arm becomes pale, cool, tingly, or numb.  You have heavy bleeding from the site. Hold pressure on the site. Document Released: 09/26/2010 Document Revised:  11/16/2011 Document Reviewed: 09/26/2010 [facility] Patient Information 2015 [place], [place]. This information is not intended to replace advice given to you by your health care provider. Make sure you discuss any questions you have with your health care provider.    NO JANUMET FOR 2 DAYS

## 2014-03-29 NOTE — Interval H&P Note (Signed)
Cath Lab Visit (complete for each Cath Lab visit)  Clinical Evaluation Leading to the Procedure:   ACS: No.  Non-ACS:    Anginal Classification: CCS III  Anti-ischemic medical therapy: Minimal Therapy (1 class of medications)  Non-Invasive Test Results: High-risk stress test findings: cardiac mortality >3%/year  Prior CABG: No previous CABG      History and Physical Interval Note:  03/29/2014 7:57 AM  Maureen Price  has presented today for surgery, with the diagnosis of cp  The various methods of treatment have been discussed with the patient and family. After consideration of risks, benefits and other options for treatment, the patient has consented to  Procedure(s): LEFT HEART CATHETERIZATION WITH CORONARY ANGIOGRAM (N/A) as a surgical intervention .  The patient's history has been reviewed, patient examined, no change in status, stable for surgery.  I have reviewed the patient's chart and labs.  Questions were answered to the patient's satisfaction.     Lorretta Harp

## 2014-03-29 NOTE — Progress Notes (Signed)
CALLED BRIAN,PA AND PER BRIAN CALLED CLIENT AND ADVISED HER TO RESUME BRILLENTA TODAY

## 2014-03-29 NOTE — CV Procedure (Signed)
Maureen Price is a 38 y.o. female    [id number] LOCATION:  FACILITY: [facility]  PHYSICIAN: Quay Burow, M.D. June 13, 1976   DATE OF PROCEDURE:  03/29/2014  DATE OF DISCHARGE:     CARDIAC CATHETERIZATION     History obtained from chart review.Maureen Price is a 38 year old moderately overweight married [place] American female mother of 3 children he works as a Quarry manager. Her primary care physician is Dr. [name]. She suffered an anterior wall myocardial infarction 10/24/13 treated with direct angioplasty using a drug-eluting stent by myself. Her cardiac risk factor profile is remarkable for continued tobacco abuse, diabetes and hyperlipidemia. She had 2 admissions for chest pain/ rule out myocardial infarction in April and June of this year. A Myoview stress test performed 03/06/14 showed severe apical ischemia currently she is asymptomatic. Because of the severity of her Myoview abnormality I have elected to bring her in for outpatient diagnostic coronary arteriography via the right radial approach to define her anatomy.    PROCEDURE DESCRIPTION:   The patient was brought to the second floor [facility] lab in the postabsorptive state. She was premedicated with Valium 5 mg by mouth, IV Versed and fentanyl. Her right wristwas prepped and shaved in usual sterile fashion. Xylocaine 1% was used for local anesthesia. A 5 French sheath was inserted into the right radial artery using standard Seldinger technique. The patient received 5000 units  of heparin  intravenously.  A 5 [place] TIG catheter along with pigtail catheter were used for selective coronary angiography and left ventriculography respectively. Omnipaque dye  was used for the entirety of the case. Retrograde aortic, left ventricular and pullback pressures were recorded.    HEMODYNAMICS:    AO SYSTOLIC/AO DIASTOLIC: 287/68   LV SYSTOLIC/LV DIASTOLIC: 115/72  ANGIOGRAPHIC RESULTS:   1. Left main; normal  2. LAD;  proximal LAD stent was widely patent with no other disease 3. Left circumflex; nondominant and normal. There was a moderate size ramus branch but likewise was normal.  4. Right coronary artery; dominant with at most 20% segmental proximal stenosis 5. Left ventriculography; RAO left ventriculogram was performed using  25 mL of Visipaque dye at 12 mL/second. The overall LVEF estimated  60 %  Without wall motion abnormalities  IMPRESSION:Maureen Price had a markedly abnormal Myoview 5 months after an anterior wall STEMI with 2 intervening ER visits for chest pain. Her coronary arteries are essentially normal and her LV function is normal as well. I believe her chest pain was noncardiac and her Myoview false-positive probably related to breast attenuation artifact. The sheath was removed and a TR band was placed on the right wrist chief patent hemostasis. The patient left the lab and stable condition. She'll be discharged home on dual antiplatelet therapy [facility] mid-level provider back in one to 2 weeks.  Lorretta Harp MD, [facility] 03/29/2014 8:37 AM

## 2014-03-29 NOTE — H&P (View-Only) (Signed)
03/16/2014 Maureen Price   1975/11/09  [id number]  Primary Physician [name], MD Primary Cardiologist: Lorretta Harp MD Renae Gloss   HPI:  [name] is a 38 year old moderately overweight married African American female mother of 3 children he works as a Quarry manager. Her primary care physician is Dr. [name]. She suffered an interval myocardial infarction 10/24/13 treated with direct angioplasty using a drug-eluting stent by myself. Her affect is in continued tobacco abuse, diabetes and hyperlipidemia. She had 2 admissions for chest pain rule out myocardial infarction in April and June of this year. A Myoview stress test performed 03/06/14 showed severe apical ischemia currently she is asymptomatic.   Current Outpatient Prescriptions  Medication Sig Dispense Refill  . acetaminophen (TYLENOL) 500 MG tablet Take 1,000 mg by mouth every 6 (six) hours as needed for mild pain.      Marland Kitchen aspirin EC 81 MG EC tablet Take 1 tablet (81 mg total) by mouth daily.      Marland Kitchen atorvastatin (LIPITOR) 80 MG tablet Take 1 tablet (80 mg total) by mouth daily at 6 PM.  30 tablet  6  . Insulin Glargine (TOUJEO SOLOSTAR) 300 UNIT/ML SOPN Inject 30 Units into the skin daily.  1.5 mL  11  . metoprolol tartrate (LOPRESSOR) 25 MG tablet Take 0.5 tablets (12.5 mg total) by mouth 2 (two) times daily.  30 tablet  6  . Multiple Vitamins-Iron (MULTIVITAMINS WITH IRON) TABS tablet Take 1 tablet by mouth daily.    0  . nitroGLYCERIN (NITROSTAT) 0.4 MG SL tablet Place 1 tablet (0.4 mg total) under the tongue every 5 (five) minutes x 3 doses as needed for chest pain.  25 tablet  4  . SitaGLIPtin-MetFORMIN HCl (JANUMET XR) 50-1000 MG TB24 Take 2 tablets by mouth daily.  180 tablet  1  . ticagrelor (BRILINTA) 90 MG TABS tablet Take 1 tablet (90 mg total) by mouth 2 (two) times daily.  40 tablet  0  . varenicline (CHANTIX CONTINUING MONTH PAK) 1 MG tablet Take 1 tablet (1 mg total) by mouth 2 (two) times  daily.  60 tablet  3  . varenicline (CHANTIX STARTING MONTH PAK) 0.5 MG X 11 & 1 MG X 42 tablet Take one 0.5 mg tablet by mouth once daily for 3 days, then increase to one 0.5 mg tablet twice daily for 4 days, then increase to one 1 mg tablet twice daily.  53 tablet  0   No current facility-administered medications for this visit.    Allergies  Allergen Reactions  . Erythromycin Anaphylaxis  . Penicillins Anaphylaxis  . Viibryd [Vilazodone Hcl]     Altered mental status.      History   Social History  . Marital Status: Married    Spouse Name: N/A    Number of Children: N/A  . Years of Education: N/A   Occupational History  . Not on file.   Social History Main Topics  . Smoking status: Current Every Day Smoker -- 0.50 packs/day for 20 years    Types: Cigarettes  . Smokeless tobacco: Never Used  . Alcohol Use: No     Comment: occasional  . Drug Use: No  . Sexual Activity: Yes    Birth Control/ Protection: Surgical   Other Topics Concern  . Not on file   Social History Narrative   Caffienated drinks- yes   Seat belt use often- yes   Regular Exercise- yes   Smoke alarm in the  home- yes   Firearms/guns in the home- no   History of physical abuse- no              Review of Systems: General: negative for chills, fever, night sweats or weight changes.  Cardiovascular: negative for chest pain, dyspnea on exertion, edema, orthopnea, palpitations, paroxysmal nocturnal dyspnea or shortness of breath Dermatological: negative for rash Respiratory: negative for cough or wheezing Urologic: negative for hematuria Abdominal: negative for nausea, vomiting, diarrhea, bright red blood per rectum, melena, or hematemesis Neurologic: negative for visual changes, syncope, or dizziness All other systems reviewed and are otherwise negative except as noted above.    Blood pressure 126/85, pulse 97, height 5\' 4"  (1.626 m), weight 240 lb 6.4 oz (109.045 kg), last menstrual period  02/23/2014.  General appearance: alert and no distress Neck: no adenopathy, no carotid bruit, no JVD, supple, symmetrical, trachea midline and thyroid not enlarged, symmetric, no tenderness/mass/nodules Lungs: clear to auscultation bilaterally Heart: regular rate and rhythm, S1, S2 normal, no murmur, click, rub or gallop Extremities: extremities normal, atraumatic, no cyanosis or edema  EKG not performed today  ASSESSMENT AND PLAN:   STEMI (ST elevation myocardial infarction), PTCA/Stent DES- Xience Alpine- LAD, large burden thrombus in LAD and RCA  Status post anterior ST segment elevation myocardial infarction 10/24/13 to PCI and stenting by myself using a drug-eluting stent. Her EF was 45% with moderate anterolateral apical hypokinesia. I did note a filling defect in her large dominant RCA and treated with Aggrastat for 18 hours. Several days later cath demonstrating this to do that to have resolved. She had a chest pain rule out myocardial infarction admission in the April and again in June. She had a Myoview stress test performed June 30 that was read as high risk with anteroapical ischemia. She however denies chest pain or shortness of breath at this time. Based on the severity of her ischemic abnormality I feel compelled to perform cardiac catheterization via the right radial approach to demonstrate patency of LAD stent  Dyslipidemia, goal LDL below 70 The patient has not been taking a statin drug. I provided her with a prescription. Also talked about appropriate heart  healthy diet.      Lorretta Harp MD FACP,FACC,FAHA, [facility] 03/16/2014 4:45 PM

## 2014-04-03 ENCOUNTER — Ambulatory Visit: Payer: Self-pay | Admitting: Cardiovascular Disease

## 2014-04-19 ENCOUNTER — Telehealth: Payer: Self-pay | Admitting: *Deleted

## 2014-04-19 NOTE — Telephone Encounter (Signed)
Left msg on triage stating she is still having leg & back pain. Tramadol is not helping. Called pt back inform her she will need to have F/u appt b4 md can rx anything else. Made appt for 8/17...Johny Chess

## 2014-04-20 ENCOUNTER — Telehealth: Payer: Self-pay | Admitting: Cardiovascular Disease

## 2014-04-20 MED ORDER — TICAGRELOR 90 MG PO TABS: 90.0000 mg | ORAL_TABLET | Freq: Two times a day (BID) | ORAL | Status: DC

## 2014-04-20 NOTE — Telephone Encounter (Signed)
Message left on VM with patient to let her know samples would be at front desk for pickup

## 2014-04-20 NOTE — Telephone Encounter (Signed)
Miya is calling to see if she can get some samples of Brilinta . Please call   Thanks

## 2014-04-23 ENCOUNTER — Encounter: Payer: Self-pay | Admitting: Internal Medicine

## 2014-04-23 ENCOUNTER — Ambulatory Visit (INDEPENDENT_AMBULATORY_CARE_PROVIDER_SITE_OTHER): Payer: BC Managed Care – PPO | Admitting: Internal Medicine

## 2014-04-23 ENCOUNTER — Ambulatory Visit (INDEPENDENT_AMBULATORY_CARE_PROVIDER_SITE_OTHER)
Admission: RE | Admit: 2014-04-23 | Discharge: 2014-04-23 | Disposition: A | Discharge status: Home / Self Care | Payer: BC Managed Care – PPO | Source: Ambulatory Visit | Attending: Internal Medicine | Admitting: Internal Medicine

## 2014-04-23 VITALS — BP 144/90 | HR 97 | Temp 98.3°F | Resp 16 | Ht 64.0 in | Wt 244.4 lb

## 2014-04-23 DIAGNOSIS — M1710 Unilateral primary osteoarthritis, unspecified knee: Secondary | ICD-10-CM

## 2014-04-23 DIAGNOSIS — M545 Low back pain, unspecified: Secondary | ICD-10-CM

## 2014-04-23 DIAGNOSIS — M1711 Unilateral primary osteoarthritis, right knee: Secondary | ICD-10-CM

## 2014-04-23 MED ORDER — HYDROCODONE-ACETAMINOPHEN 10-325 MG PO TABS: 1.0000 | ORAL_TABLET | Freq: Three times a day (TID) | ORAL | Status: DC | PRN

## 2014-04-23 NOTE — Progress Notes (Signed)
Pre visit review using our clinic review tool, if applicable. No additional management support is needed unless otherwise documented below in the visit note. 

## 2014-04-23 NOTE — Progress Notes (Signed)
Subjective:    Patient ID: Maureen Price, female    DOB: 1976/02/03, 38 y.o.   MRN: [id number]  Back Pain This is a recurrent problem. The current episode started more than 1 month ago. The problem occurs constantly. The problem has been gradually worsening since onset. The pain is present in the lumbar spine. The quality of the pain is described as aching. The pain does not radiate. The pain is at a severity of 6/10. The pain is severe. The pain is worse during the day. The symptoms are aggravated by bending and position. Pertinent negatives include no abdominal pain, bladder incontinence, bowel incontinence, chest pain, dysuria, fever, headaches, leg pain, numbness, paresis, paresthesias, pelvic pain, perianal numbness, tingling, weakness or weight loss. Risk factors include obesity and lack of exercise. She has tried analgesics for the symptoms. The treatment provided mild relief.      Review of Systems  Constitutional: Negative.  Negative for fever, chills, weight loss, diaphoresis, appetite change and fatigue.  HENT: Negative.   Eyes: Negative.   Respiratory: Negative.  Negative for cough, choking, chest tightness, shortness of breath, wheezing and stridor.   Cardiovascular: Negative.  Negative for chest pain and leg swelling.  Gastrointestinal: Negative.  Negative for nausea, vomiting, abdominal pain, diarrhea, constipation, blood in stool and bowel incontinence.  Endocrine: Negative.  Negative for polydipsia, polyphagia and polyuria.  Genitourinary: Negative.  Negative for bladder incontinence, dysuria and pelvic pain.  Musculoskeletal: Positive for arthralgias and back pain. Negative for gait problem, joint swelling, myalgias, neck pain and neck stiffness.       She complains of right knee pain and knee buckling and giving way on her  Skin: Negative.  Negative for rash.  Allergic/Immunologic: Negative.   Neurological: Negative.  Negative for tingling, weakness, numbness,  headaches and paresthesias.  Hematological: Negative.  Negative for adenopathy. Does not bruise/bleed easily.  Psychiatric/Behavioral: Negative.        Objective:   Physical Exam  Vitals reviewed. Constitutional: She is oriented to person, place, and time. She appears well-developed and well-nourished. No distress.  HENT:  Head: Normocephalic and atraumatic.  Mouth/Throat: Oropharynx is clear and moist. No oropharyngeal exudate.  Eyes: Conjunctivae are normal. Right eye exhibits no discharge. Left eye exhibits no discharge. No scleral icterus.  Neck: Normal range of motion. Neck supple. No JVD present. No tracheal deviation present. No thyromegaly present.  Cardiovascular: Normal rate, regular rhythm, normal heart sounds and intact distal pulses.  Exam reveals no gallop and no friction rub.   No murmur heard. Pulmonary/Chest: Effort normal and breath sounds normal. No stridor. No respiratory distress. She has no wheezes. She has no rales. She exhibits no tenderness.  Abdominal: Soft. Bowel sounds are normal. She exhibits no distension and no mass. There is no tenderness. There is no rebound and no guarding.  Musculoskeletal: Normal range of motion. She exhibits no edema and no tenderness.       Right knee: Normal. She exhibits normal range of motion, no swelling, no effusion, no ecchymosis, no deformity, no laceration, no erythema, normal alignment, no LCL laxity, normal patellar mobility and no bony tenderness. No tenderness found.       Lumbar back: Normal. She exhibits normal range of motion, no tenderness, no bony tenderness, no swelling, no edema, no deformity, no laceration, no pain, no spasm and normal pulse.  Lymphadenopathy:    She has no cervical adenopathy.  Neurological: She is alert and oriented to person, place, and time. She has  normal strength. She displays no atrophy, no tremor and normal reflexes. No cranial nerve deficit or sensory deficit. She exhibits normal muscle tone.  She displays a negative Romberg sign. She displays no seizure activity. Coordination and gait normal.  Reflex Scores:      Tricep reflexes are 1+ on the right side and 1+ on the left side.      Bicep reflexes are 1+ on the right side and 1+ on the left side.      Brachioradialis reflexes are 1+ on the right side and 1+ on the left side.      Patellar reflexes are 1+ on the right side and 1+ on the left side.      Achilles reflexes are 1+ on the right side and 1+ on the left side. Neg SLR in BLE  Skin: Skin is warm and dry. No rash noted. She is not diaphoretic. No erythema. No pallor.     Lab Results  Component Value Date   WBC 9.7 03/23/2014   HGB 12.9 03/23/2014   HCT 37.7 03/23/2014   PLT 279 03/23/2014   GLUCOSE 261* 03/23/2014   CHOL 126 02/23/2014   TRIG 156* 02/23/2014   HDL 19* 02/23/2014   LDLCALC 76 02/23/2014   ALT 20 02/22/2014   AST 15 02/22/2014   NA 135 03/23/2014   K 3.9 03/23/2014   CL 101 03/23/2014   CREATININE 0.54 03/23/2014   BUN 8 03/23/2014   CO2 26 03/23/2014   TSH 1.55 08/25/2013   INR 1.09 03/23/2014   HGBA1C 11.5* 02/22/2014   MICROALBUR 1.1 07/07/2012       Assessment & Plan:

## 2014-04-23 NOTE — Patient Instructions (Signed)
Back Pain, Adult Low back pain is very common. About 1 in 5 people have back pain.The cause of low back pain is rarely dangerous. The pain often gets better over time.About half of people with a sudden onset of back pain feel better in just 2 weeks. About 8 in 10 people feel better by 6 weeks.  CAUSES Some common causes of back pain include:  Strain of the muscles or ligaments supporting the spine.  Wear and tear (degeneration) of the spinal discs.  Arthritis.  Direct injury to the back. DIAGNOSIS Most of the time, the direct cause of low back pain is not known.However, back pain can be treated effectively even when the exact cause of the pain is unknown.Answering your caregiver's questions about your overall health and symptoms is one of the most accurate ways to make sure the cause of your pain is not dangerous. If your caregiver needs more information, he or she may order lab work or imaging tests (X-rays or MRIs).However, even if imaging tests show changes in your back, this usually does not require surgery. HOME CARE INSTRUCTIONS For many people, back pain returns.Since low back pain is rarely dangerous, it is often a condition that people can learn to manageon their own.   Remain active. It is stressful on the back to sit or stand in one place. Do not sit, drive, or stand in one place for more than 30 minutes at a time. Take short walks on level surfaces as soon as pain allows.Try to increase the length of time you walk each day.  Do not stay in bed.Resting more than 1 or 2 days can delay your recovery.  Do not avoid exercise or work.Your body is made to move.It is not dangerous to be active, even though your back may hurt.Your back will likely heal faster if you return to being active before your pain is gone.  Pay attention to your body when you bend and lift. Many people have less discomfortwhen lifting if they bend their knees, keep the load close to their bodies,and  avoid twisting. Often, the most comfortable positions are those that put less stress on your recovering back.  Find a comfortable position to sleep. Use a firm mattress and lie on your side with your knees slightly bent. If you lie on your back, put a pillow under your knees.  Only take over-the-counter or prescription medicines as directed by your caregiver. Over-the-counter medicines to reduce pain and inflammation are often the most helpful.Your caregiver may prescribe muscle relaxant drugs.These medicines help dull your pain so you can more quickly return to your normal activities and healthy exercise.  Put ice on the injured area.  Put ice in a plastic bag.  Place a towel between your skin and the bag.  Leave the ice on for 15-20 minutes, 03-04 times a day for the first 2 to 3 days. After that, ice and heat may be alternated to reduce pain and spasms.  Ask your caregiver about trying back exercises and gentle massage. This may be of some benefit.  Avoid feeling anxious or stressed.Stress increases muscle tension and can worsen back pain.It is important to recognize when you are anxious or stressed and learn ways to manage it.Exercise is a great option. SEEK MEDICAL CARE IF:  You have pain that is not relieved with rest or medicine.  You have pain that does not improve in 1 week.  You have new symptoms.  You are generally not feeling well. SEEK   IMMEDIATE MEDICAL CARE IF:   You have pain that radiates from your back into your legs.  You develop new bowel or bladder control problems.  You have unusual weakness or numbness in your arms or legs.  You develop nausea or vomiting.  You develop abdominal pain.  You feel faint. Document Released: 08/24/2005 Document Revised: 02/23/2012 Document Reviewed: 12/26/2013 ExitCare Patient Information 2015 [employer]. This information is not intended to replace advice given to you by your health care provider. Make sure you  discuss any questions you have with your health care provider.  

## 2014-04-24 ENCOUNTER — Encounter: Payer: Self-pay | Admitting: Internal Medicine

## 2014-04-24 NOTE — Assessment & Plan Note (Addendum)
She tells me that tramadol has not helped her pain much Will try norco, she can't take nsaids Will check a plain film today to see how severe this is I have asked her to start PT as well

## 2014-04-24 NOTE — Assessment & Plan Note (Signed)
Her exam does not reveal any danger signs She has not had much pain relief with tramadol, will try norco Will check plain films of her lower back to see if there is DDD, spurring, etc I have asked her to start PT for this

## 2014-05-08 ENCOUNTER — Encounter (HOSPITAL_COMMUNITY): Payer: Self-pay | Admitting: Emergency Medicine

## 2014-05-08 ENCOUNTER — Emergency Department (HOSPITAL_COMMUNITY)
Admission: EM | Admit: 2014-05-08 | Discharge: 2014-05-08 | Discharge status: Against Medical Advice | Payer: BC Managed Care – PPO | Attending: Emergency Medicine | Admitting: Emergency Medicine

## 2014-05-08 ENCOUNTER — Emergency Department (HOSPITAL_COMMUNITY): Payer: BC Managed Care – PPO

## 2014-05-08 DIAGNOSIS — M129 Arthropathy, unspecified: Secondary | ICD-10-CM | POA: Insufficient documentation

## 2014-05-08 DIAGNOSIS — I252 Old myocardial infarction: Secondary | ICD-10-CM | POA: Diagnosis not present

## 2014-05-08 DIAGNOSIS — E119 Type 2 diabetes mellitus without complications: Secondary | ICD-10-CM | POA: Diagnosis not present

## 2014-05-08 DIAGNOSIS — Z7982 Long term (current) use of aspirin: Secondary | ICD-10-CM | POA: Diagnosis not present

## 2014-05-08 DIAGNOSIS — I2510 Atherosclerotic heart disease of native coronary artery without angina pectoris: Secondary | ICD-10-CM | POA: Insufficient documentation

## 2014-05-08 DIAGNOSIS — R079 Chest pain, unspecified: Secondary | ICD-10-CM | POA: Diagnosis not present

## 2014-05-08 DIAGNOSIS — E785 Hyperlipidemia, unspecified: Secondary | ICD-10-CM | POA: Diagnosis not present

## 2014-05-08 DIAGNOSIS — F17200 Nicotine dependence, unspecified, uncomplicated: Secondary | ICD-10-CM | POA: Diagnosis not present

## 2014-05-08 DIAGNOSIS — Z794 Long term (current) use of insulin: Secondary | ICD-10-CM | POA: Diagnosis not present

## 2014-05-08 DIAGNOSIS — Z8659 Personal history of other mental and behavioral disorders: Secondary | ICD-10-CM | POA: Insufficient documentation

## 2014-05-08 DIAGNOSIS — Z8719 Personal history of other diseases of the digestive system: Secondary | ICD-10-CM | POA: Insufficient documentation

## 2014-05-08 DIAGNOSIS — J45909 Unspecified asthma, uncomplicated: Secondary | ICD-10-CM | POA: Diagnosis not present

## 2014-05-08 DIAGNOSIS — Z9861 Coronary angioplasty status: Secondary | ICD-10-CM | POA: Insufficient documentation

## 2014-05-08 DIAGNOSIS — Z880 Allergy status to penicillin: Secondary | ICD-10-CM | POA: Insufficient documentation

## 2014-05-08 DIAGNOSIS — E669 Obesity, unspecified: Secondary | ICD-10-CM | POA: Diagnosis not present

## 2014-05-08 DIAGNOSIS — Z7902 Long term (current) use of antithrombotics/antiplatelets: Secondary | ICD-10-CM | POA: Diagnosis not present

## 2014-05-08 DIAGNOSIS — Z79899 Other long term (current) drug therapy: Secondary | ICD-10-CM | POA: Diagnosis not present

## 2014-05-08 LAB — BASIC METABOLIC PANEL
Anion gap: 14 (ref 5–15)
BUN: 7 mg/dL (ref 6–23)
CALCIUM: 8.6 mg/dL (ref 8.4–10.5)
CO2: 22 mEq/L (ref 19–32)
Chloride: 100 mEq/L (ref 96–112)
Creatinine, Ser: 0.35 mg/dL — ABNORMAL LOW (ref 0.50–1.10)
GFR calc Af Amer: >90 mL/min (ref >90)
GFR calc non Af Amer: >90 mL/min (ref >90)
Glucose, Bld: 295 mg/dL — ABNORMAL HIGH (ref 70–99)
Potassium: 4.6 mEq/L (ref 3.7–5.3)
SODIUM: 136 meq/L — AB (ref 137–147)

## 2014-05-08 LAB — CBC WITH DIFFERENTIAL/PLATELET
Basophils Absolute: 0 10*3/uL (ref 0.0–0.1)
Basophils Relative: 0 % (ref 0–1)
EOS PCT: 2 % (ref 0–5)
Eosinophils Absolute: 0.1 10*3/uL (ref 0.0–0.7)
HEMATOCRIT: 37.8 % (ref 36.0–46.0)
HEMOGLOBIN: 13.1 g/dL (ref 12.0–15.0)
LYMPHS ABS: 2.9 10*3/uL (ref 0.7–4.0)
Lymphocytes Relative: 40 % (ref 12–46)
MCH: 27.2 pg (ref 26.0–34.0)
MCHC: 34.7 g/dL (ref 30.0–36.0)
MCV: 78.6 fL (ref 78.0–100.0)
Monocytes Absolute: 0.5 10*3/uL (ref 0.1–1.0)
Monocytes Relative: 6 % (ref 3–12)
Neutro Abs: 3.9 10*3/uL (ref 1.7–7.7)
Neutrophils Relative %: 52 % (ref 43–77)
PLATELETS: 247 10*3/uL (ref 150–400)
RBC: 4.81 MIL/uL (ref 3.87–5.11)
RDW: 14.3 % (ref 11.5–15.5)
WBC: 7.5 10*3/uL (ref 4.0–10.5)

## 2014-05-08 LAB — I-STAT TROPONIN, ED: Troponin i, poc: 0 ng/mL (ref 0.00–0.08)

## 2014-05-08 LAB — TROPONIN I: Troponin I: <0.3 ng/mL (ref <0.30)

## 2014-05-08 MED ORDER — ASPIRIN 81 MG PO CHEW
324.0000 mg | CHEWABLE_TABLET | Freq: Once | ORAL | Status: AC
  Administered 2014-05-08: 324 mg via ORAL
  Filled 2014-05-08: qty 4

## 2014-05-08 MED ORDER — NITROGLYCERIN 0.4 MG SL SUBL
0.4000 mg | SUBLINGUAL_TABLET | SUBLINGUAL | Status: DC | PRN
  Administered 2014-05-08: 0.4 mg via SUBLINGUAL
  Filled 2014-05-08: qty 1

## 2014-05-08 NOTE — ED Provider Notes (Signed)
CSN: [id number]     Arrival date & time 05/08/14  0998 History   First MD Initiated Contact with Patient 05/08/14 [phone]     Chief Complaint  Patient presents with  . Chest Pain     (Consider location/radiation/quality/duration/timing/severity/associated sxs/prior Treatment) HPI Pt presents with c/o left sided chest pain.  She has hx of STEMI in 2/15 and had stent placed in LAD at that time.  Yesterday evening she developed left sided chest pain.  She was cleaning house and feels that the pain is related to a pulled muscle.  However she was worried given her cardiac hx which prompted ED visit.  No radiation of pain.  No nausea or diaphoresis.  No leg swelling.  No pleuritic component of chest pain.  There are no other associated systemic symptoms, there are no other alleviating or modifying factors.   Past Medical History  Diagnosis Date  . Diabetes mellitus     2002; no meds since 2004  . Acute myocardial infarction of other anterior wall, initial episode of care   . Paroxysmal ventricular tachycardia   . Obesity   . Dyslipidemia, goal LDL below 70 10/26/2013  . STEMI (ST elevation myocardial infarction)   . CAD (coronary artery disease)   . Arthritis   . Dysrhythmia   . Anginal pain   . Asthma   . Shortness of breath   . Depression   . Anxiety   . GERD (gastroesophageal reflux disease)   . Headache(784.0)   . Tobacco abuse    Past Surgical History  Procedure Laterality Date  . Cesarean section    . Gallbladder surgery    . Tubal ligation      2000  . Cesarean section  [id number]  . Coronary angioplasty with stent placement     Family History  Problem Relation Age of Onset  . Arthritis Mother   . Diabetes Mother   . Heart disease Father   . Stroke Father   . Cancer Neg Hx   . Alcohol abuse Neg Hx   . Early death Neg Hx   . Kidney disease Neg Hx   . Hypertension Neg Hx   . Hyperlipidemia Neg Hx   . Diabetes Maternal Grandfather    History  Substance Use  Topics  . Smoking status: Current Every Day Smoker -- 0.50 packs/day for 20 years    Types: Cigarettes  . Smokeless tobacco: Never Used  . Alcohol Use: No     Comment: occasional   OB History   Grav Para Term Preterm Abortions TAB SAB Ect Mult Living   4 3 3  1  1   3      Review of Systems ROS reviewed and all otherwise negative except for mentioned in HPI    Allergies  Erythromycin; Penicillins; and Viibryd  Home Medications   Prior to Admission medications   Medication Sig Start Date End Date Taking? Authorizing Provider  aspirin EC 81 MG tablet Take 81 mg by mouth daily.   Yes Historical Provider, MD  atorvastatin (LIPITOR) 80 MG tablet Take 80 mg by mouth daily.   Yes Historical Provider, MD  Biotin 1000 MCG tablet Take 1,000 mcg by mouth 2 (two) times daily.   Yes Historical Provider, MD  HYDROcodone-acetaminophen (NORCO) 10-325 MG per tablet Take 1 tablet by mouth every 8 (eight) hours as needed. 04/23/14  Yes [name], MD  Insulin Glargine (TOUJEO SOLOSTAR [place]) Inject 30 Units into the skin every morning.  Yes Historical Provider, MD  Multiple Vitamins-Minerals (MULTIVITAMIN PO) Take 1 tablet by mouth daily.   Yes Historical Provider, MD  nitroGLYCERIN (NITROSTAT) 0.4 MG SL tablet Place 0.4 mg under the tongue every 5 (five) minutes as needed for chest pain.   Yes Historical Provider, MD  SitaGLIPtin-MetFORMIN HCl (JANUMET XR) 50-1000 MG TB24 Take 2 tablets by mouth daily.   Yes Historical Provider, MD  ticagrelor (BRILINTA) 90 MG TABS tablet Take 1 tablet (90 mg total) by mouth 2 (two) times daily. 04/20/14  Yes Lorretta Harp, MD   BP 91/43  Pulse 80  Temp(Src) 98.1 F (36.7 C) (Oral)  Resp 20  SpO2 98%  LMP 04/07/2014 Vitals reviewed Physical Exam Physical Examination: General appearance - alert, well appearing, and in no distress Mental status - alert, oriented to person, place, and time Eyes - n conjunctival injection, no scleral icterus Mouth - mucous  membranes moist, pharynx normal without lesions Chest - clear to auscultation, no wheezes, rales or rhonchi, symmetric air entry, ttp over left anterior chest wall Heart - normal rate, regular rhythm, normal S1, S2, no murmurs, rubs, clicks or gallops Abdomen - soft, nontender, nondistended, no masses or organomegaly Extremities - peripheral pulses normal, no pedal edema, no clubbing or cyanosis Skin - normal coloration and turgor, no rashes  ED Course  Procedures (including critical care time)  9:01 AM d/w cardiology,they will consult on the patient.   1:38 PM checked with cardiology about consult- they state the PA is coming over to [facility] now.   2:31 PM cardiology has seen patient, have ordered troponin I, plan is likely discharge if this is negative.   4:05 PM troponin I is negative, awaiting attending cardiology input prior to likely discharge.  Labs Review Labs Reviewed  BASIC METABOLIC PANEL - Abnormal; Notable for the following:    Sodium 136 (*)    Glucose, Bld 295 (*)    Creatinine, Ser 0.35 (*)    All other components within normal limits  CBC WITH DIFFERENTIAL  TROPONIN I  TROPONIN I  Randolm Idol, ED    Imaging Review Dg Chest 2 View  05/08/2014   CLINICAL DATA:  Left-sided chest pain.  EXAM: CHEST  2 VIEW  COMPARISON:  PA and lateral chest 02/22/2014 and 09/04/2013.  FINDINGS: The lungs are clear. Heart size is normal. There is no pneumothorax or pleural effusion. No focal bony abnormality is identified.  IMPRESSION: Negative chest.   Electronically Signed   By: Inge Rise M.D.   On: 05/08/2014 07:57     EKG Interpretation   Date/Time:  Tuesday May 08 2014 07:02:39 EDT Ventricular Rate:  91 PR Interval:  181 QRS Duration: 91 QT Interval:  370 QTC Calculation: 455 R Axis:   28 Text Interpretation:  Sinus rhythm Low voltage, precordial leads  Anteroseptal infarct, old Artifact No significant change since last  tracing Confirmed by [facility]   MD, MARTHA [phone]) on 05/08/2014 1:49:57 PM      MDM   Final diagnoses:  Chest pain, unspecified chest pain type    Pt with hx of stemi in 2/15 presentign with chest pain.  Initial workup is reassuring, pt feels the pain is most similar to a muscle strain.  Cardiology consult requested, PA has seen patient and ordered troponin I which is negative.  Awaiting attending cardiology input but anticipate discharge.      Threasa Beards, MD 05/08/14 [phone]

## 2014-05-08 NOTE — ED Notes (Signed)
Patient is alert and oriented x3.  She is complaining of chest pain that started yesterday  While she was cleaning her house.  Patient states that she has a history of MI earlier this year.  Currently she rates her pain 5 of 10.

## 2014-05-08 NOTE — ED Notes (Signed)
EKG leads disconnected and in bed with hospital gown. Pt appears to have left AMA, will wait 10 minutes to see if patient returns.

## 2014-05-08 NOTE — Discharge Instructions (Signed)
Return to the ED with any concerns including difficulty breathing, nausea associated with chest pain, leg swelling, fainting, decreased level of alertness/lethargy, or any other alarming symptoms

## 2014-05-08 NOTE — ED Notes (Signed)
Pt deemed to have left against medical advice, pt left without informing staff thus not giving staff the opportunity to obtain discharge vitals or a signature. Pt left prior to MD deeming patient ready for discharge.

## 2014-05-08 NOTE — Consult Note (Signed)
CARDIOLOGY CONSULT NOTE   Patient ID: Maureen Price MRN: [id number], DOB/AGE: 38-Apr-1977   Admit date: 05/08/2014 Date of Consult: 05/08/2014   Primary Physician: [name], MD Primary Cardiologist: Dr. Gwenlyn Found  Pt. Profile  Obese 38 year old [place] American female with past medical history of diabetes, hyperlipidemia, tobacco abuse, and history of coronary artery disease status post anterior STEMI in Feb 2015 presented with CP   Problem List  Past Medical History  Diagnosis Date  . Diabetes mellitus     2002; no meds since 2004  . Acute myocardial infarction of other anterior wall, initial episode of care   . Paroxysmal ventricular tachycardia   . Obesity   . Dyslipidemia, goal LDL below 70 10/26/2013  . STEMI (ST elevation myocardial infarction)   . CAD (coronary artery disease)   . Arthritis   . Dysrhythmia   . Anginal pain   . Asthma   . Shortness of breath   . Depression   . Anxiety   . GERD (gastroesophageal reflux disease)   . Headache(784.0)   . Tobacco abuse     Past Surgical History  Procedure Laterality Date  . Cesarean section    . Gallbladder surgery    . Tubal ligation      2000  . Cesarean section  [id number]  . Coronary angioplasty with stent placement       Allergies  Allergies  Allergen Reactions  . Erythromycin Anaphylaxis  . Penicillins Anaphylaxis  . Viibryd [Vilazodone Hcl]     Altered mental status.      HPI   Obese 38 year old [place] American female with past medical history of diabetes, hyperlipidemia, tobacco abuse, and history of coronary artery disease status post anterior STEMI in Feb 2015. Patient underwent cardiac catheterization on 10/24/2013 which showed patent left main, normal left circumflex, completely occluded LAD after D1 with drug-eluting stent, RCA had a filling defect in the proximal portion. She was subsequently started on ASA and Brilinta. She underwent a relook cath 2 days later on 2/19 to evaluate the  filling defect in RCA. The result showed EF 50-55%, thrombus that was seen in the proximal RCA during the previous cath had resolved. She was admitted in April 2015 with recurrent chest pain. She was placed on observation as she had stopped her Brilinta after the first month, serial troponin overnight was negative and there was no EKG changes. Patient was eventually discharged to resume ASA and Brillinta. Patient underwent stress test on 03/06/2014 which showed EF 49%, high risk study with reversible anterior ischemia and anterior hypokinesis. Due to her abnormal stress test, patient underwent a repeat cardiac catheterization on 03/29/2014 which showed EF 60%, patent proximal LAD stent, 20% proximal RCA stenosis. The stress test was felt to be falsely positive likely due to breast attenuation.  According to the patient, she has been compliant with aspirin and Brilinta therapy since that time. She had to deal with some back pain last month, however that has been controlled with pain medication. 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 She denies any exertional chest pain, shortness breath, lower extremity edema, orthopnea or paroxysmal nocturnal dyspnea. Patient states she has been doing lots of work both at her usual work place and at home as well for the last week. She felt she may have over extended herself. She was cleaning the house in the afternoon of 05/07/2014, when she felt she pulled something and began to have localized  left-sided dull chest pain above her breast. She states this new chest pain is unlike her previous anginal symptom which were left-sided chest pressure radiating to the arm in Feb. She said that the current chest pain is more localized and is worse with palpation and relieved with rotating her upper body, she felt like she pulled something and the need to pop it. The dull localized pain has persistent for more than 18 hrs since onset. However  given her history of coronary artery disease and recent cath, patient want to make sure and therefore presented to [facility] ED for further evaluation.  On arrival to [facility] ED, patient had a blood pressure of 109/69. Significant laboratory finding include sodium of 138 6, creatinine of 0.35, glucose of 295, negative point-of-care troponin. EKG showed a normal sinus rhythm with heart rates 91, poor R wave progression in anterior leads, otherwise no significant ST-T wave changes. Chest x-ray was negative. Cardiology was consulted for chest pain.  Inpatient Medications     Family History Family History  Problem Relation Age of Onset  . Arthritis Mother   . Diabetes Mother   . Heart disease Father   . Stroke Father   . Cancer Neg Hx   . Alcohol abuse Neg Hx   . Early death Neg Hx   . Kidney disease Neg Hx   . Hypertension Neg Hx   . Hyperlipidemia Neg Hx   . Diabetes Maternal Grandfather      Social History History   Social History  . Marital Status: Married    Spouse Name: N/A    Number of Children: N/A  . Years of Education: N/A   Occupational History  . Not on file.   Social History Main Topics  . Smoking status: Current Every Day Smoker -- 0.50 packs/day for 20 years    Types: Cigarettes  . Smokeless tobacco: Never Used  . Alcohol Use: No     Comment: occasional  . Drug Use: No  . Sexual Activity: Yes    Birth Control/ Protection: Surgical   Other Topics Concern  . Not on file   Social History Narrative   Caffienated drinks- yes   Seat belt use often- yes   Regular Exercise- yes   Smoke alarm in the home- yes   Firearms/guns in the home- no   History of physical abuse- no              Review of Systems  General:  No chills, fever, night sweats or weight changes.  Cardiovascular:  No dyspnea on exertion, edema, orthopnea, palpitations, paroxysmal nocturnal dyspnea. +chest pain Dermatological: No rash, lesions/masses Respiratory: No cough,  dyspnea Urologic: No hematuria, dysuria Abdominal:   No nausea, vomiting, diarrhea, bright red blood per rectum, melena, or hematemesis Neurologic:  No visual changes, wkns, changes in mental status. All other systems reviewed and are otherwise negative except as noted above.  Physical Exam  Blood pressure 91/43, pulse 80, temperature 98.1 F (36.7 C), temperature source Oral, resp. rate 20, last menstrual period 04/07/2014, SpO2 98.00%.  General: Pleasant, NAD Psych: Normal affect. Neuro: Alert and oriented X 3. Moves all extremities spontaneously. HEENT: Normal  Neck: Supple without bruits or JVD. Lungs:  Resp regular and unlabored, CTA. Heart: RRR no s3, s4, or murmurs. Abdomen: Soft, non-tender, non-distended, BS + x 4.  Extremities: No clubbing, cyanosis or edema. DP/PT/Radials 2+ and equal bilaterally.  Labs  No results found for this basename: CKTOTAL, CKMB, TROPONINI,  in  the last 72 hours Lab Results  Component Value Date   WBC 7.5 05/08/2014   HGB 13.1 05/08/2014   HCT 37.8 05/08/2014   MCV 78.6 05/08/2014   PLT 247 05/08/2014    Recent Labs Lab 05/08/14 0734  NA 136*  K 4.6  CL 100  CO2 22  BUN 7  CREATININE 0.35*  CALCIUM 8.6  GLUCOSE 295*   Lab Results  Component Value Date   CHOL 126 02/23/2014   HDL 19* 02/23/2014   LDLCALC 76 02/23/2014   TRIG 156* 02/23/2014   Lab Results  Component Value Date   DDIMER 0.46 06/26/2013    Radiology/Studies  Dg Chest 2 View  05/08/2014   CLINICAL DATA:  Left-sided chest pain.  EXAM: CHEST  2 VIEW  COMPARISON:  PA and lateral chest 02/22/2014 and 09/04/2013.  FINDINGS: The lungs are clear. Heart size is normal. There is no pneumothorax or pleural effusion. No focal bony abnormality is identified.  IMPRESSION: Negative chest.   Electronically Signed   By: Inge Rise M.D.   On: 05/08/2014 07:57   Dg Lumbar Spine Complete  04/24/2014   CLINICAL DATA:  Two year history of low back pain. No recent injuries.  EXAM: LUMBAR  SPINE - COMPLETE 4+ VIEW  COMPARISON:  07/07/2012.  FINDINGS: Five non rib-bearing lumbar vertebrae with anatomic alignment. No fractures. Mild disc space narrowing and endplate hypertrophic changes at L1-2 and to a lesser degree L2-3, progressive since the prior examination. Remaining disc spaces well preserved. No pars defects. No significant facet arthropathy. Visualized sacroiliac joints intact.  IMPRESSION: Mild degenerative disc disease and spondylosis at L1- 2 and to a lesser degree L2-3, progressive since 2013.   Electronically Signed   By: Evangeline Dakin M.D.   On: 04/24/2014 08:18   Dg Knee Complete 4 Views Right  04/24/2014   CLINICAL DATA:  Knee pain and buckling.  EXAM: RIGHT KNEE - COMPLETE 4+ VIEW  COMPARISON:  None.  FINDINGS: No fracture. No bone lesion. Joint is normally space and aligned. No arthropathic changes. No joint effusion. Soft tissues are unremarkable.  IMPRESSION: Negative.   Electronically Signed   By: Lajean Manes M.D.   On: 04/24/2014 08:16    ECG  Normal sinus rhythm with heart rate 90s, poor progression anterior lead, no significant ST-T wave changes. When compared to the previous EKG in July 2015, poor R wave progression anteriorly seems to be persistent.  ASSESSMENT AND PLAN  1. Atypical chest pain: likely musculoskeletal in origin  - Worse with palpation, improved with upper body rotation. Negative initial point-of-care troponin, no change in EKG  - Patient states this episode of localized chest pain is unlike her previous anginal symptom  - Recent cardiac cath on 03/29/2014 reassuring, LAD stent patent, 20% proximal RCA stenosis, EF 60%  - patient is eager to go home, I have ordered diet for her  - I have ordered 1 troponin I, expect negative result. Would not repeat stress test as patient recently had a false positive stress test. Would not recath the patient after 2 month esp given atypical nature of it  - Will discuss with Attending, if troponin I came  back negative, likely able to be discharged from ED  2. history of coronary artery disease status post anterior STEMI in Feb 2015  - last cath (after abnormal myoview) 03/29/2014 which showed EF 60%, patent proximal LAD stent, 20% proximal RCA stenosis  3. Diabetes: BG >290s, need better control 4. Hyperlipidemia  5. tobacco abuse   Signed, Almyra Deforest, [place] 05/08/2014, 1:57 PM  Patient left the ER against medical advice prior to me being able to see her.   Maureen Keithley,MD 10:52 AM

## 2014-05-22 ENCOUNTER — Encounter: Payer: Self-pay | Admitting: Internal Medicine

## 2014-06-06 ENCOUNTER — Ambulatory Visit (INDEPENDENT_AMBULATORY_CARE_PROVIDER_SITE_OTHER): Payer: BC Managed Care – PPO | Admitting: Cardiovascular Disease

## 2014-06-06 ENCOUNTER — Encounter: Payer: Self-pay | Admitting: Cardiovascular Disease

## 2014-06-06 VITALS — BP 138/90 | HR 98 | Ht 65.0 in | Wt 239.2 lb

## 2014-06-06 DIAGNOSIS — E785 Hyperlipidemia, unspecified: Secondary | ICD-10-CM

## 2014-06-06 DIAGNOSIS — I2119 ST elevation (STEMI) myocardial infarction involving other coronary artery of inferior wall: Secondary | ICD-10-CM

## 2014-06-06 DIAGNOSIS — I2111 ST elevation (STEMI) myocardial infarction involving right coronary artery: Secondary | ICD-10-CM

## 2014-06-06 DIAGNOSIS — I2109 ST elevation (STEMI) myocardial infarction involving other coronary artery of anterior wall: Secondary | ICD-10-CM

## 2014-06-06 DIAGNOSIS — F17200 Nicotine dependence, unspecified, uncomplicated: Secondary | ICD-10-CM

## 2014-06-06 NOTE — Progress Notes (Signed)
06/06/2014 Maureen Price   [birth year]  [id number]  Primary Physician [name], MD Primary Cardiologist: Lorretta Harp MD Renae Gloss   HPI:  Ms. Maureen Price is a 38 year old moderately overweight married African American female mother of 3 children he works as a [employer]ar[employer] manager. Her primary care physician is Dr. [name]. She suffered an interval myocardial infarction 10/24/13 treated with direct angioplasty using a drug-eluting stent by myself. Her cardiac risk factor profile is notable for continued tobacco abuse, diabetes and hyperlipidemia. She had 2 admissions for chest pain rule out myocardial infarction in April and June of this year. A Myoview stress test performed 03/06/14 showed severe apical ischemia which led to a cardiac catheterization performed by myself 03/29/14 which was entirely normal. She recently was seen by [facility] emergency room because of left upper chest pain probably musculoskeletal in nature.    Current Outpatient Prescriptions  Medication Sig Dispense Refill  . aspirin EC 81 MG tablet Take 81 mg by mouth daily.      Maureen Price atorvastatin (LIPITOR) 80 MG tablet Take 80 mg by mouth daily.      . Biotin 1000 MCG tablet Take 1,000 mcg by mouth 2 (two) times daily.      Maureen Price HYDROcodone-acetaminophen (NORCO) 10-325 MG per tablet Take 1 tablet by mouth every 8 (eight) hours as needed.  90 tablet  0  . Insulin Glargine (TOUJEO SOLOSTAR [place]) Inject 30 Units into the skin every morning.      . Multiple Vitamins-Minerals (MULTIVITAMIN PO) Take 1 tablet by mouth daily.      . nitroGLYCERIN (NITROSTAT) 0.4 MG SL tablet Place 0.4 mg under the tongue every 5 (five) minutes as needed for chest pain.      . SitaGLIPtin-MetFORMIN HCl (JANUMET XR) 50-1000 MG TB24 Take 2 tablets by mouth daily.      . ticagrelor (BRILINTA) 90 MG TABS tablet Take 1 tablet (90 mg total) by mouth 2 (two) times daily.  20 tablet  0   No current facility-administered medications for  this visit.    Allergies  Allergen Reactions  . Erythromycin Anaphylaxis  . Penicillins Anaphylaxis  . Viibryd [Vilazodone Hcl]     Altered mental status.      History   Social History  . Marital Status: Married    Spouse Name: Maureen Price    Number of Children: Maureen Price  . Years of Education: Maureen Price   Occupational History  . Not on file.   Social History Main Topics  . Smoking status: Current Every Day Smoker -- 0.50 packs/day for 20 years    Types: Cigarettes  . Smokeless tobacco: Never Used  . Alcohol Use: No     Comment: occasional  . Drug Use: No  . Sexual Activity: Yes    Birth Control/ Protection: Surgical   Other Topics Concern  . Not on file   Social History Narrative   Caffienated drinks- yes   Seat belt use often- yes   Regular Exercise- yes   Smoke alarm in the home- yes   Firearms/guns in the home- no   History of physical abuse- no              Review of Systems: General: negative for chills, fever, night sweats or weight changes.  Cardiovascular: negative for chest pain, dyspnea on exertion, edema, orthopnea, palpitations, paroxysmal nocturnal dyspnea or shortness of breath Dermatological: negative for rash Respiratory: negative for cough or wheezing Urologic: negative for hematuria Abdominal: negative for  nausea, vomiting, diarrhea, bright red blood per rectum, melena, or hematemesis Neurologic: negative for visual changes, syncope, or dizziness All other systems reviewed and are otherwise negative except as noted above.    Blood pressure 138/90, pulse 98, height 5\' 5"  (1.651 m), weight 239 lb 3.2 oz (108.5 kg), last menstrual period 05/08/2014.  General appearance: alert and no distress Neck: no adenopathy, no carotid bruit, no JVD, supple, symmetrical, trachea midline and thyroid not enlarged, symmetric, no tenderness/mass/nodules Lungs: clear to auscultation bilaterally Heart: regular rate and rhythm, S1, S2 normal, no murmur, click, rub or  gallop Extremities: extremities normal, atraumatic, no cyanosis or edema  EKG normal sinus rhythm at 93 with septal Q waves  ASSESSMENT AND PLAN:   STEMI (ST elevation myocardial infarction), PTCA/Stent DES- Xience [place]- LAD, large burden thrombus in LAD and RCA  History of CAD status post anterior wall myocardial infarction 10/24/13 at 4:30 AM today with drug-eluting stenting to her proximal LAD and temporally. She was recathed translated by Dr. Julianne Handler  because of chest pain and had widely patent stent. I recathed her 03/29/14 after a positive Myoview stress test secondary to chest pain revealing a widely patent stent with normal function.  Tobacco use disorder Resistant to risk factor modification  Dyslipidemia, goal LDL below 70 On statin therapy with recent lipid profile performed 02/23/14 revealing a total cholesterol of 126, LDL of 76 and HDL of [place] MD [facility] 06/06/2014 4:15 PM

## 2014-06-06 NOTE — Assessment & Plan Note (Signed)
Resistant to risk factor modification

## 2014-06-06 NOTE — Assessment & Plan Note (Signed)
History of CAD status post anterior wall myocardial infarction 10/24/13 at 4:30 AM today with drug-eluting stenting to her proximal LAD and temporally. She was recathed translated by Dr. Julianne Handler  because of chest pain and had widely patent stent. I recathed her 03/29/14 after a positive Myoview stress test secondary to chest pain revealing a widely patent stent with normal function.

## 2014-06-06 NOTE — Patient Instructions (Signed)
Your physician wants you to follow-up in: 6 months with Dr Berry. You will receive a reminder letter in the mail two months in advance. If you don't receive a letter, please call our office to schedule the follow-up appointment.  

## 2014-06-06 NOTE — Assessment & Plan Note (Signed)
On statin therapy with recent lipid profile performed 02/23/14 revealing a total cholesterol of 126, LDL of 76 and HDL of 19

## 2014-06-07 ENCOUNTER — Ambulatory Visit: Payer: BC Managed Care – PPO | Attending: Internal Medicine

## 2014-06-14 ENCOUNTER — Encounter: Payer: Self-pay | Admitting: Internal Medicine

## 2014-06-14 ENCOUNTER — Ambulatory Visit (INDEPENDENT_AMBULATORY_CARE_PROVIDER_SITE_OTHER): Payer: BC Managed Care – PPO | Admitting: Internal Medicine

## 2014-06-14 ENCOUNTER — Other Ambulatory Visit (INDEPENDENT_AMBULATORY_CARE_PROVIDER_SITE_OTHER): Payer: BC Managed Care – PPO

## 2014-06-14 VITALS — BP 138/76 | HR 92 | Temp 98.5°F | Resp 16 | Ht 65.0 in | Wt 241.0 lb

## 2014-06-14 DIAGNOSIS — M545 Low back pain, unspecified: Secondary | ICD-10-CM

## 2014-06-14 DIAGNOSIS — Z23 Encounter for immunization: Secondary | ICD-10-CM

## 2014-06-14 DIAGNOSIS — F17200 Nicotine dependence, unspecified, uncomplicated: Secondary | ICD-10-CM

## 2014-06-14 DIAGNOSIS — Z720 Tobacco use: Secondary | ICD-10-CM

## 2014-06-14 DIAGNOSIS — E118 Type 2 diabetes mellitus with unspecified complications: Secondary | ICD-10-CM

## 2014-06-14 DIAGNOSIS — F4322 Adjustment disorder with anxiety: Secondary | ICD-10-CM

## 2014-06-14 DIAGNOSIS — E785 Hyperlipidemia, unspecified: Secondary | ICD-10-CM

## 2014-06-14 DIAGNOSIS — M1711 Unilateral primary osteoarthritis, right knee: Secondary | ICD-10-CM

## 2014-06-14 DIAGNOSIS — F411 Generalized anxiety disorder: Secondary | ICD-10-CM | POA: Insufficient documentation

## 2014-06-14 LAB — BASIC METABOLIC PANEL
BUN: 6 mg/dL (ref 6–23)
CALCIUM: 8.9 mg/dL (ref 8.4–10.5)
CO2: 29 meq/L (ref 19–32)
Chloride: 104 mEq/L (ref 96–112)
Creatinine, Ser: 0.4 mg/dL (ref 0.4–1.2)
GFR: 223.17 mL/min (ref >60.00)
Glucose, Bld: 224 mg/dL — ABNORMAL HIGH (ref 70–99)
Potassium: 3.7 mEq/L (ref 3.5–5.1)
Sodium: 138 mEq/L (ref 135–145)

## 2014-06-14 LAB — GLUCOSE, POCT (MANUAL RESULT ENTRY): POC GLUCOSE: 234 mg/dL — AB (ref 70–99)

## 2014-06-14 LAB — HEMOGLOBIN A1C: HEMOGLOBIN A1C: 10.4 % — AB (ref 4.6–6.5)

## 2014-06-14 MED ORDER — CANAGLIFLOZIN 300 MG PO TABS: 1.0000 | ORAL_TABLET | Freq: Every day | ORAL | Status: DC

## 2014-06-14 MED ORDER — ALPRAZOLAM 0.5 MG PO TABS: 0.5000 mg | ORAL_TABLET | Freq: Every evening | ORAL | Status: DC | PRN

## 2014-06-14 MED ORDER — HYDROCODONE-ACETAMINOPHEN 10-325 MG PO TABS: 1.0000 | ORAL_TABLET | Freq: Three times a day (TID) | ORAL | Status: DC | PRN

## 2014-06-14 MED ORDER — INSULIN GLARGINE 300 UNIT/ML ~~LOC~~ SOPN: 50.0000 [IU] | PEN_INJECTOR | Freq: Every day | SUBCUTANEOUS | Status: DC

## 2014-06-14 MED ORDER — LIRAGLUTIDE 18 MG/3ML ~~LOC~~ SOPN: 1.8000 mg | PEN_INJECTOR | Freq: Every day | SUBCUTANEOUS | Status: DC

## 2014-06-14 NOTE — Progress Notes (Signed)
Subjective:    Patient ID: Maureen Price, female    DOB: May 07, 1976, 38 y.o.   MRN: [id number]  Diabetes She presents for her follow-up diabetic visit. She has type 2 diabetes mellitus. Her disease course has been fluctuating. Hypoglycemia symptoms include nervousness/anxiousness. Pertinent negatives for hypoglycemia include no confusion, dizziness, headaches or speech difficulty. Associated symptoms include polydipsia, polyphagia and polyuria. Pertinent negatives for diabetes include no blurred vision, no chest pain, no fatigue, no foot paresthesias, no foot ulcerations, no visual change, no weakness and no weight loss. There are no hypoglycemic complications. Symptoms are stable. Diabetic complications include heart disease. Current diabetic treatment includes oral agent (dual therapy) and insulin injections. She is compliant with treatment most of the time. She is following a generally unhealthy diet. When asked about meal planning, she reported none. She never participates in exercise. Her home blood glucose trend is increasing steadily. Her breakfast blood glucose range is generally >200 mg/dl. Her lunch blood glucose range is generally >200 mg/dl. Her dinner blood glucose range is generally >200 mg/dl. Her highest blood glucose is >200 mg/dl. Her overall blood glucose range is >200 mg/dl. An ACE inhibitor/angiotensin II receptor blocker is not being taken. She does not see a podiatrist.Eye exam is not current.      Review of Systems  Constitutional: Negative.  Negative for fever, chills, weight loss, diaphoresis, activity change, appetite change, fatigue and unexpected weight change.  HENT: Negative.   Eyes: Negative.  Negative for blurred vision.  Respiratory: Negative for apnea, cough, choking, chest tightness, shortness of breath, wheezing and stridor.   Cardiovascular: Negative.  Negative for chest pain, palpitations and leg swelling.  Gastrointestinal: Positive for diarrhea.  Negative for nausea, vomiting, abdominal pain, constipation and blood in stool.       Metformin causes GI upset  Endocrine: Positive for polydipsia, polyphagia and polyuria. Negative for cold intolerance and heat intolerance.  Genitourinary: Positive for frequency. Negative for dysuria, urgency, hematuria, decreased urine volume and difficulty urinating.  Musculoskeletal: Positive for arthralgias (knee), back pain and neck pain. Negative for gait problem, joint swelling, myalgias and neck stiffness.  Skin: Negative.  Negative for rash.  Allergic/Immunologic: Negative.   Neurological: Negative.  Negative for dizziness, syncope, speech difficulty, weakness, light-headedness, numbness and headaches.  Hematological: Negative.  Negative for adenopathy. Does not bruise/bleed easily.  Psychiatric/Behavioral: Positive for sleep disturbance. Negative for suicidal ideas, hallucinations, behavioral problems, confusion, self-injury, dysphoric mood, decreased concentration and agitation. The patient is nervous/anxious. The patient is not hyperactive.        She complains of anxiety and nervousness, she has been taking a friend's supply of xaxax with relief from her symptoms.       Objective:   Physical Exam  Vitals reviewed. Constitutional: She is oriented to person, place, and time. She appears well-developed and well-nourished. No distress.  HENT:  Head: Normocephalic and atraumatic.  Mouth/Throat: Oropharynx is clear and moist. No oropharyngeal exudate.  Eyes: Conjunctivae are normal. Right eye exhibits no discharge. Left eye exhibits no discharge. No scleral icterus.  Neck: Normal range of motion. Neck supple. No JVD present. No tracheal deviation present. No thyromegaly present.  Cardiovascular: Normal rate, regular rhythm and intact distal pulses.  Exam reveals no gallop and no friction rub.   No murmur heard. Pulmonary/Chest: Effort normal and breath sounds normal. No stridor. No respiratory  distress. She has no wheezes. She has no rales. She exhibits no tenderness.  Abdominal: Soft. Bowel sounds are normal. She exhibits no  distension and no mass. There is no tenderness. There is no rebound and no guarding.  Musculoskeletal: Normal range of motion. She exhibits no edema and no tenderness.       Cervical back: Normal.       Lumbar back: Normal.  Lymphadenopathy:    She has no cervical adenopathy.  Neurological: She is oriented to person, place, and time.  Skin: Skin is warm and dry. No rash noted. She is not diaphoretic. No erythema. No pallor.  Psychiatric: Her behavior is normal. Judgment and thought content normal. Her mood appears anxious. Her affect is not angry, not blunt, not labile and not inappropriate. Her speech is not delayed. She is not slowed and not withdrawn. Thought content is not delusional. Cognition and memory are normal. She does not exhibit a depressed mood. She expresses no homicidal and no suicidal ideation. She expresses no suicidal plans and no homicidal plans. She exhibits normal recent memory and normal remote memory. She is attentive.     Lab Results  Component Value Date   WBC 7.5 05/08/2014   HGB 13.1 05/08/2014   HCT 37.8 05/08/2014   PLT 247 05/08/2014   GLUCOSE 295* 05/08/2014   CHOL 126 02/23/2014   TRIG 156* 02/23/2014   HDL 19* 02/23/2014   LDLCALC 76 02/23/2014   ALT 20 02/22/2014   AST 15 02/22/2014   NA 136* 05/08/2014   K 4.6 05/08/2014   CL 100 05/08/2014   CREATININE 0.35* 05/08/2014   BUN 7 05/08/2014   CO2 22 05/08/2014   TSH 1.55 08/25/2013   INR 1.09 03/23/2014   HGBA1C 11.5* 02/22/2014   MICROALBUR 1.1 07/07/2012       Assessment & Plan:

## 2014-06-14 NOTE — Progress Notes (Signed)
Pre visit review using our clinic review tool, if applicable. No additional management support is needed unless otherwise documented below in the visit note. 

## 2014-06-14 NOTE — Patient Instructions (Signed)

## 2014-06-15 ENCOUNTER — Telehealth: Payer: Self-pay | Admitting: Internal Medicine

## 2014-06-15 NOTE — Telephone Encounter (Signed)
emmi mailed  °

## 2014-06-18 NOTE — Assessment & Plan Note (Signed)
Will cont norco as needed for pain 

## 2014-06-18 NOTE — Assessment & Plan Note (Signed)
She will not take an antidepressant Will cont xanax as needed

## 2014-06-18 NOTE — Assessment & Plan Note (Signed)
Will stop metformin due to GI upset Will increase the toujeo to 50 u per day Will change her to victoza and invokana for weight reduction and better blood sugar control

## 2014-07-09 ENCOUNTER — Encounter: Payer: Self-pay | Admitting: Internal Medicine

## 2014-07-19 ENCOUNTER — Telehealth: Payer: Self-pay | Admitting: Internal Medicine

## 2014-07-19 DIAGNOSIS — M1711 Unilateral primary osteoarthritis, right knee: Secondary | ICD-10-CM

## 2014-07-19 DIAGNOSIS — M545 Low back pain, unspecified: Secondary | ICD-10-CM

## 2014-07-19 MED ORDER — HYDROCODONE-ACETAMINOPHEN 10-325 MG PO TABS: 1.0000 | ORAL_TABLET | Freq: Three times a day (TID) | ORAL | Status: DC | PRN

## 2014-07-19 NOTE — Telephone Encounter (Signed)
done

## 2014-07-19 NOTE — Telephone Encounter (Signed)
Pt is requesting a refill of hydrocodone for back pain. Pls advise.

## 2014-07-30 ENCOUNTER — Emergency Department (HOSPITAL_COMMUNITY)
Admission: EM | Admit: 2014-07-30 | Discharge: 2014-07-31 | Disposition: A | Discharge status: Home / Self Care | Payer: BC Managed Care – PPO | Attending: Emergency Medicine | Admitting: Emergency Medicine

## 2014-07-30 ENCOUNTER — Encounter (HOSPITAL_COMMUNITY): Payer: Self-pay

## 2014-07-30 ENCOUNTER — Telehealth: Payer: Self-pay | Admitting: Cardiovascular Disease

## 2014-07-30 ENCOUNTER — Emergency Department (HOSPITAL_COMMUNITY): Payer: BC Managed Care – PPO

## 2014-07-30 DIAGNOSIS — Z7982 Long term (current) use of aspirin: Secondary | ICD-10-CM | POA: Diagnosis not present

## 2014-07-30 DIAGNOSIS — E119 Type 2 diabetes mellitus without complications: Secondary | ICD-10-CM | POA: Diagnosis not present

## 2014-07-30 DIAGNOSIS — E669 Obesity, unspecified: Secondary | ICD-10-CM | POA: Insufficient documentation

## 2014-07-30 DIAGNOSIS — F329 Major depressive disorder, single episode, unspecified: Secondary | ICD-10-CM | POA: Insufficient documentation

## 2014-07-30 DIAGNOSIS — F419 Anxiety disorder, unspecified: Secondary | ICD-10-CM | POA: Insufficient documentation

## 2014-07-30 DIAGNOSIS — E785 Hyperlipidemia, unspecified: Secondary | ICD-10-CM | POA: Insufficient documentation

## 2014-07-30 DIAGNOSIS — Z880 Allergy status to penicillin: Secondary | ICD-10-CM | POA: Insufficient documentation

## 2014-07-30 DIAGNOSIS — I25119 Atherosclerotic heart disease of native coronary artery with unspecified angina pectoris: Secondary | ICD-10-CM | POA: Insufficient documentation

## 2014-07-30 DIAGNOSIS — J45909 Unspecified asthma, uncomplicated: Secondary | ICD-10-CM | POA: Diagnosis not present

## 2014-07-30 DIAGNOSIS — Z79899 Other long term (current) drug therapy: Secondary | ICD-10-CM | POA: Insufficient documentation

## 2014-07-30 DIAGNOSIS — M1990 Unspecified osteoarthritis, unspecified site: Secondary | ICD-10-CM | POA: Diagnosis not present

## 2014-07-30 DIAGNOSIS — I472 Ventricular tachycardia: Secondary | ICD-10-CM | POA: Insufficient documentation

## 2014-07-30 DIAGNOSIS — Z720 Tobacco use: Secondary | ICD-10-CM | POA: Diagnosis not present

## 2014-07-30 DIAGNOSIS — I252 Old myocardial infarction: Secondary | ICD-10-CM | POA: Diagnosis not present

## 2014-07-30 DIAGNOSIS — Z9861 Coronary angioplasty status: Secondary | ICD-10-CM | POA: Insufficient documentation

## 2014-07-30 DIAGNOSIS — R0789 Other chest pain: Secondary | ICD-10-CM

## 2014-07-30 DIAGNOSIS — Z794 Long term (current) use of insulin: Secondary | ICD-10-CM | POA: Diagnosis not present

## 2014-07-30 DIAGNOSIS — Z8719 Personal history of other diseases of the digestive system: Secondary | ICD-10-CM | POA: Diagnosis not present

## 2014-07-30 DIAGNOSIS — R079 Chest pain, unspecified: Secondary | ICD-10-CM

## 2014-07-30 DIAGNOSIS — Z3202 Encounter for pregnancy test, result negative: Secondary | ICD-10-CM | POA: Diagnosis not present

## 2014-07-30 LAB — CBC
HEMATOCRIT: 39.4 % (ref 36.0–46.0)
HEMOGLOBIN: 13.8 g/dL (ref 12.0–15.0)
MCH: 26.9 pg (ref 26.0–34.0)
MCHC: 35 g/dL (ref 30.0–36.0)
MCV: 76.8 fL — ABNORMAL LOW (ref 78.0–100.0)
Platelets: 320 10*3/uL (ref 150–400)
RBC: 5.13 MIL/uL — ABNORMAL HIGH (ref 3.87–5.11)
RDW: 14.2 % (ref 11.5–15.5)
WBC: 10 10*3/uL (ref 4.0–10.5)

## 2014-07-30 LAB — I-STAT TROPONIN, ED: Troponin i, poc: 0 ng/mL (ref 0.00–0.08)

## 2014-07-30 LAB — BASIC METABOLIC PANEL
Anion gap: 14 (ref 5–15)
BUN: 8 mg/dL (ref 6–23)
CHLORIDE: 100 meq/L (ref 96–112)
CO2: 23 mEq/L (ref 19–32)
Calcium: 9.1 mg/dL (ref 8.4–10.5)
Creatinine, Ser: 0.5 mg/dL (ref 0.50–1.10)
GFR calc Af Amer: >90 mL/min (ref >90)
GFR calc non Af Amer: >90 mL/min (ref >90)
Glucose, Bld: 194 mg/dL — ABNORMAL HIGH (ref 70–99)
POTASSIUM: 3.3 meq/L — AB (ref 3.7–5.3)
SODIUM: 137 meq/L (ref 137–147)

## 2014-07-30 LAB — POC URINE PREG, ED: PREG TEST UR: NEGATIVE

## 2014-07-30 MED ORDER — FAMOTIDINE 20 MG PO TABS
20.0000 mg | ORAL_TABLET | Freq: Once | ORAL | Status: AC
  Administered 2014-07-30: 20 mg via ORAL
  Filled 2014-07-30: qty 1

## 2014-07-30 MED ORDER — SIMETHICONE 80 MG PO CHEW
80.0000 mg | CHEWABLE_TABLET | Freq: Once | ORAL | Status: AC
  Administered 2014-07-30: 80 mg via ORAL
  Filled 2014-07-30: qty 1

## 2014-07-30 MED ORDER — HYDROCODONE-ACETAMINOPHEN 5-325 MG PO TABS
1.0000 | ORAL_TABLET | Freq: Once | ORAL | Status: AC
  Administered 2014-07-30: 1 via ORAL
  Filled 2014-07-30: qty 1

## 2014-07-30 MED ORDER — POTASSIUM CHLORIDE CRYS ER 20 MEQ PO TBCR
20.0000 meq | EXTENDED_RELEASE_TABLET | Freq: Once | ORAL | Status: AC
  Administered 2014-07-30: 20 meq via ORAL
  Filled 2014-07-30: qty 1

## 2014-07-30 NOTE — ED Notes (Signed)
Pt from home with chest pain that started last night. Pt sts she took 1 nitro with relief but that pain came back.  She did not take any nitro today.  Pain currently 10/10.  Pt sts it feels like pressure "maybe gas".  Pt with recent MI in February of this year.  Associates nausea, shortness of breath and back pain with pain.

## 2014-07-30 NOTE — Telephone Encounter (Signed)
Pt called in stating that she is out of [place] and would like some samples if we are have any. Please call  Thanks

## 2014-07-30 NOTE — ED Notes (Signed)
Patient transported to X-ray 

## 2014-07-30 NOTE — ED Provider Notes (Signed)
CSN: [id number]     Arrival date & time 07/30/14  2030 History   First MD Initiated Contact with Patient 07/30/14 2117     Chief Complaint  Patient presents with  . Chest Pain     (Consider location/radiation/quality/duration/timing/severity/associated sxs/prior Treatment) Patient is a 38 y.o. female presenting with chest pain. The history is provided by the patient.  Chest Pain Associated symptoms: no abdominal pain, no back pain, no cough, no fever, no headache, no numbness, no palpitations, no shortness of breath, not vomiting and no weakness   pt w hx cad/stent/mi 10/2013, c/o onset midsternal/lower chest pain last night, at rest. Dull constant. States feels like gas as if needs to burp or pass gas.  States pain unlike prior cardiac cp. No associated nv, diaphoresis or sob. No unusual doe or fatigue. No exertional cp or discomfort. No pleuritic pain. No leg pain or swelling. +smoker. Denies new or worsening cough. No fever or chills. No back pain. No neck pain.       Past Medical History  Diagnosis Date  . Diabetes mellitus     2002; no meds since 2004  . Acute myocardial infarction of other anterior wall, initial episode of care   . Paroxysmal ventricular tachycardia   . Obesity   . Dyslipidemia, goal LDL below 70 10/26/2013  . STEMI (ST elevation myocardial infarction)   . CAD (coronary artery disease)   . Arthritis   . Dysrhythmia   . Anginal pain   . Asthma   . Shortness of breath   . Depression   . Anxiety   . GERD (gastroesophageal reflux disease)   . Headache(784.0)   . Tobacco abuse    Past Surgical History  Procedure Laterality Date  . Cesarean section    . Gallbladder surgery    . Tubal ligation      2000  . Cesarean section  [id number]  . Coronary angioplasty with stent placement     Family History  Problem Relation Age of Onset  . Arthritis Mother   . Diabetes Mother   . Heart disease Father   . Stroke Father   . Cancer Neg Hx   . Alcohol  abuse Neg Hx   . Early death Neg Hx   . Kidney disease Neg Hx   . Hypertension Neg Hx   . Hyperlipidemia Neg Hx   . Diabetes Maternal Grandfather    History  Substance Use Topics  . Smoking status: Current Every Day Smoker -- 0.50 packs/day for 20 years    Types: Cigarettes  . Smokeless tobacco: Never Used  . Alcohol Use: No     Comment: occasional   OB History    Gravida Para Term Preterm AB TAB SAB Ectopic Multiple Living   4 3 3  1  1   3      Review of Systems  Constitutional: Negative for fever and chills.  HENT: Negative for sore throat.   Eyes: Negative for redness.  Respiratory: Negative for cough and shortness of breath.   Cardiovascular: Positive for chest pain. Negative for palpitations and leg swelling.  Gastrointestinal: Negative for vomiting, abdominal pain, diarrhea and constipation.  Genitourinary: Negative for flank pain.  Musculoskeletal: Negative for back pain and neck pain.  Skin: Negative for rash.  Neurological: Negative for weakness, numbness and headaches.  Hematological: Does not bruise/bleed easily.  Psychiatric/Behavioral: Negative for confusion.      Allergies  Erythromycin; Penicillins; Metformin and related; and Viibryd  Home Medications  Prior to Admission medications   Medication Sig Start Date End Date Taking? Authorizing Provider  ALPRAZolam Duanne Moron) 0.5 MG tablet Take 1 tablet (0.5 mg total) by mouth at bedtime as needed for anxiety. 06/14/14  Yes [name], MD  aspirin EC 81 MG tablet Take 81 mg by mouth daily.   Yes Historical Provider, MD  atorvastatin (LIPITOR) 80 MG tablet Take 80 mg by mouth daily.   Yes Historical Provider, MD  Biotin 1000 MCG tablet Take 1,000 mcg by mouth 2 (two) times daily.   Yes Historical Provider, MD  Canagliflozin (INVOKANA) 300 MG TABS Take 1 tablet (300 mg total) by mouth daily. 06/14/14  Yes [name], MD  HYDROcodone-acetaminophen (NORCO) 10-325 MG per tablet Take 1 tablet by mouth every 8  (eight) hours as needed. 07/19/14  Yes [name], MD  Liraglutide (VICTOZA) 18 MG/3ML SOPN Inject 1.8 mg into the skin daily. 06/14/14  Yes [name], MD  Multiple Vitamins-Minerals (MULTIVITAMIN PO) Take 1 tablet by mouth daily.   Yes Historical Provider, MD  ticagrelor (BRILINTA) 90 MG TABS tablet Take 1 tablet (90 mg total) by mouth 2 (two) times daily. 04/20/14  Yes Lorretta Harp, MD  Insulin Glargine (TOUJEO SOLOSTAR) 300 UNIT/ML SOPN Inject 50 Units into the skin daily. 06/14/14   [name], MD  nitroGLYCERIN (NITROSTAT) 0.4 MG SL tablet Place 0.4 mg under the tongue every 5 (five) minutes as needed for chest pain.    Historical Provider, MD   BP 141/80 mmHg  Pulse 92  Temp(Src) 98.2 F (36.8 C) (Oral)  Resp 18  Ht 5\' 5"  (1.651 m)  Wt 232 lb (105.235 kg)  BMI 38.61 kg/m2  SpO2 100% Physical Exam  Constitutional: She is oriented to person, place, and time. She appears well-developed and well-nourished. No distress.  HENT:  Nose: Nose normal.  Mouth/Throat: Oropharynx is clear and moist.  Eyes: Conjunctivae are normal. No scleral icterus.  Neck: Neck supple. No tracheal deviation present.  Cardiovascular: Normal rate, regular rhythm and intact distal pulses.  Exam reveals no gallop and no friction rub.   No murmur heard. Pulmonary/Chest: Effort normal and breath sounds normal. No respiratory distress. She exhibits no tenderness.  Abdominal: Soft. Normal appearance and bowel sounds are normal. She exhibits no distension and no mass. There is no tenderness. There is no rebound and no guarding.  Genitourinary:  No cva tenderness.  Musculoskeletal: She exhibits no edema or tenderness.  Neurological: She is alert and oriented to person, place, and time.  Skin: Skin is warm and dry. No rash noted. She is not diaphoretic.  Psychiatric: She has a normal mood and affect.  Nursing note and vitals reviewed.   ED Course  Procedures (including critical care time)  Results  for orders placed or performed during the hospital encounter of 07/30/14  CBC  Result Value Ref Range   WBC 10.0 4.0 - 10.5 K/uL   RBC 5.13 (H) 3.87 - 5.11 MIL/uL   Hemoglobin 13.8 12.0 - 15.0 g/dL   HCT 39.4 36.0 - 46.0 %   MCV 76.8 (L) 78.0 - 100.0 fL   MCH 26.9 26.0 - 34.0 pg   MCHC 35.0 30.0 - 36.0 g/dL   RDW 14.2 11.5 - 15.5 %   Platelets 320 150 - 400 K/uL  Basic metabolic panel  Result Value Ref Range   Sodium 137 137 - 147 mEq/L   Potassium 3.3 (L) 3.7 - 5.3 mEq/L   Chloride 100  96 - 112 mEq/L   CO2 23 19 - 32 mEq/L   Glucose, Bld 194 (H) 70 - 99 mg/dL   BUN 8 6 - 23 mg/dL   Creatinine, Ser 0.50 0.50 - 1.10 mg/dL   Calcium 9.1 8.4 - 10.5 mg/dL   GFR calc non Af Amer >90 >90 mL/min   GFR calc Af Amer >90 >90 mL/min   Anion gap 14 5 - 15  I-stat troponin, ED (not at [facility])  Result Value Ref Range   Troponin i, poc 0.00 0.00 - 0.08 ng/mL   Comment 3           Dg Chest 2 View  07/30/2014   CLINICAL DATA:  Left-sided chest pain since last night. Smoker. History of stent placement.  EXAM: CHEST  2 VIEW  COMPARISON:  05/08/2014  FINDINGS: Heart size is normal. There is no pleural effusion or edema. Atelectasis is noted in the left lung base. The visualized osseous structures are unremarkable.  IMPRESSION: 1. Left base atelectasis.   Electronically Signed   By: Kerby Moors M.D.   On: 07/30/2014 23:06       EKG Interpretation   Date/Time:  Monday July 30 2014 20:35:17 EST Ventricular Rate:  96 PR Interval:  194 QRS Duration: 82 QT Interval:  354 QTC Calculation: 447 R Axis:   36 Text Interpretation:  Normal sinus rhythm Septal infarct , age  undetermined Abnormal ECG Sinus rhythm q waves anteriorly No significant  change since last tracing Abnormal ekg Confirmed by Carmin Muskrat  MD  ([id number]) on 07/30/2014 8:43:06 PM      MDM  Iv ns.   Labs.  Reviewed nursing notes and prior charts for additional history.   Ecg, cxr. Continuous pulse ox and  monitor.  Pt w cath, lad occlusion, stent 10/2013.  Pt with 2 subsequent cp rule out admission and cath 03/2014 with patent stents, and felt to have recurrent non cardiac cp then.  Pt states current symptoms feel like gas/reflux which has had in past, and unlike prior cardiac cp.  Pt will take pill for of gi med for symptom relief, but does not want gi cocktail.  Pt indicates is compliant w her outpatient tx, incl asa.   Will try pepcid and gax x for symptom relief. Hydrocodone 1 po.   Recheck symptoms resolved.  Repeat troponin pending.  Signed out to Dr Lita Mains to check repeat troponin.  If delta trop neg, and symptoms resolved, feel pt will be stable for d/c from ED w close follow up with her doctors/cardiologists in coming week.      Mirna Mires, MD 07/30/14 [id number]

## 2014-07-30 NOTE — Telephone Encounter (Signed)
Spoke with pt, aware samples placed at the front desk for pick up 

## 2014-07-30 NOTE — Discharge Instructions (Signed)
It was our pleasure to provide your ER care today - we hope that you feel better.  Try taking pepcid, maalox, and/or gas-x for GI symptom relief.  Avoid smoking.  For chest discomfort, follow up with your cardiologist in the next few days - call office this morning to arrange that follow up.  From today's lab tests, your potassium level is mildly low (3.3) - eat plenty of fruits and vegetables, and have rechecked by your doctor in 1 week.   Also from today's lab tests, your blood sugar is high (194) - continue your diabetic medication, follow diabetic diet, and follow up with primary care doctor in coming week.  Return to ER right away if worse, new symptoms, trouble breathing, recurrent or persistent chest pain, fevers, other concern.  You were given pain medication in the ER - no driving for the next 4 hours.          Chest Pain (Nonspecific) It is often hard to give a specific diagnosis for the cause of chest pain. There is always a chance that your pain could be related to something serious, such as a heart attack or a blood clot in the lungs. You need to follow up with your health care provider for further evaluation. CAUSES   Heartburn.  Pneumonia or bronchitis.  Anxiety or stress.  Inflammation around your heart (pericarditis) or lung (pleuritis or pleurisy).  A blood clot in the lung.  A collapsed lung (pneumothorax). It can develop suddenly on its own (spontaneous pneumothorax) or from trauma to the chest.  Shingles infection (herpes zoster virus). The chest wall is composed of bones, muscles, and cartilage. Any of these can be the source of the pain.  The bones can be bruised by injury.  The muscles or cartilage can be strained by coughing or overwork.  The cartilage can be affected by inflammation and become sore (costochondritis). DIAGNOSIS  Lab tests or other studies may be needed to find the cause of your pain. Your health care provider may have you take  a test called an ambulatory electrocardiogram (ECG). An ECG records your heartbeat patterns over a 24-hour period. You may also have other tests, such as:  Transthoracic echocardiogram (TTE). During echocardiography, sound waves are used to evaluate how blood flows through your heart.  Transesophageal echocardiogram (TEE).  Cardiac monitoring. This allows your health care provider to monitor your heart rate and rhythm in real time.  Holter monitor. This is a portable device that records your heartbeat and can help diagnose heart arrhythmias. It allows your health care provider to track your heart activity for several days, if needed.  Stress tests by exercise or by giving medicine that makes the heart beat faster. TREATMENT   Treatment depends on what may be causing your chest pain. Treatment may include:  Acid blockers for heartburn.  Anti-inflammatory medicine.  Pain medicine for inflammatory conditions.  Antibiotics if an infection is present.  You may be advised to change lifestyle habits. This includes stopping smoking and avoiding alcohol, caffeine, and chocolate.  You may be advised to keep your head raised (elevated) when sleeping. This reduces the chance of acid going backward from your stomach into your esophagus. Most of the time, nonspecific chest pain will improve within 2-3 days with rest and mild pain medicine.  HOME CARE INSTRUCTIONS   If antibiotics were prescribed, take them as directed. Finish them even if you start to feel better.  For the next few days, avoid physical activities that  bring on chest pain. Continue physical activities as directed.  Do not use any tobacco products, including cigarettes, chewing tobacco, or electronic cigarettes.  Avoid drinking alcohol.  Only take medicine as directed by your health care provider.  Follow your health care provider's suggestions for further testing if your chest pain does not go away.  Keep any follow-up  appointments you made. If you do not go to an appointment, you could develop lasting (chronic) problems with pain. If there is any problem keeping an appointment, call to reschedule. SEEK MEDICAL CARE IF:   Your chest pain does not go away, even after treatment.  You have a rash with blisters on your chest.  You have a fever. SEEK IMMEDIATE MEDICAL CARE IF:   You have increased chest pain or pain that spreads to your arm, neck, jaw, back, or abdomen.  You have shortness of breath.  You have an increasing cough, or you cough up blood.  You have severe back or abdominal pain.  You feel nauseous or vomit.  You have severe weakness.  You faint.  You have chills. This is an emergency. Do not wait to see if the pain will go away. Get medical help at once. Call your local emergency services (911 in [place] Do not drive yourself to the hospital. MAKE SURE YOU:   Understand these instructions.  Will watch your condition.  Will get help right away if you are not doing well or get worse. Document Released: 06/03/2005 Document Revised: 08/29/2013 Document Reviewed: 03/29/2008 [facility] Patient Information 2015 [place], [place]. This information is not intended to replace advice given to you by your health care provider. Make sure you discuss any questions you have with your health care provider.     Gastroesophageal Reflux Disease, Adult Gastroesophageal reflux disease (GERD) happens when acid from your stomach flows up into the esophagus. When acid comes in contact with the esophagus, the acid causes soreness (inflammation) in the esophagus. Over time, GERD may create small holes (ulcers) in the lining of the esophagus. CAUSES   Increased body weight. This puts pressure on the stomach, making acid rise from the stomach into the esophagus.  Smoking. This increases acid production in the stomach.  Drinking alcohol. This causes decreased pressure in the lower esophageal sphincter (valve  or ring of muscle between the esophagus and stomach), allowing acid from the stomach into the esophagus.  Late evening meals and a full stomach. This increases pressure and acid production in the stomach.  A malformed lower esophageal sphincter. Sometimes, no cause is found. SYMPTOMS   Burning pain in the lower part of the mid-chest behind the breastbone and in the mid-stomach area. This may occur twice a week or more often.  Trouble swallowing.  Sore throat.  Dry cough.  Asthma-like symptoms including chest tightness, shortness of breath, or wheezing. DIAGNOSIS  Your caregiver may be able to diagnose GERD based on your symptoms. In some cases, X-rays and other tests may be done to check for complications or to check the condition of your stomach and esophagus. TREATMENT  Your caregiver may recommend over-the-counter or prescription medicines to help decrease acid production. Ask your caregiver before starting or adding any new medicines.  HOME CARE INSTRUCTIONS   Change the factors that you can control. Ask your caregiver for guidance concerning weight loss, quitting smoking, and alcohol consumption.  Avoid foods and drinks that make your symptoms worse, such as:  Caffeine or alcoholic drinks.  Chocolate.  Peppermint or mint flavorings.  Garlic and onions.  Spicy foods.  Citrus fruits, such as oranges, lemons, or limes.  Tomato-based foods such as sauce, chili, salsa, and pizza.  Fried and fatty foods.  Avoid lying down for the 3 hours prior to your bedtime or prior to taking a nap.  Eat small, frequent meals instead of large meals.  Wear loose-fitting clothing. Do not wear anything tight around your waist that causes pressure on your stomach.  Raise the head of your bed 6 to 8 inches with wood blocks to help you sleep. Extra pillows will not help.  Only take over-the-counter or prescription medicines for pain, discomfort, or fever as directed by your  caregiver.  Do not take aspirin, ibuprofen, or other nonsteroidal anti-inflammatory drugs (NSAIDs). SEEK IMMEDIATE MEDICAL CARE IF:   You have pain in your arms, neck, jaw, teeth, or back.  Your pain increases or changes in intensity or duration.  You develop nausea, vomiting, or sweating (diaphoresis).  You develop shortness of breath, or you faint.  Your vomit is green, yellow, black, or looks like coffee grounds or blood.  Your stool is red, bloody, or black. These symptoms could be signs of other problems, such as heart disease, gastric bleeding, or esophageal bleeding. MAKE SURE YOU:   Understand these instructions.  Will watch your condition.  Will get help right away if you are not doing well or get worse. Document Released: 06/03/2005 Document Revised: 11/16/2011 Document Reviewed: 03/13/2011 [facility] Patient Information 2015 [place], [place]. This information is not intended to replace advice given to you by your health care provider. Make sure you discuss any questions you have with your health care provider.    Smoking Hazards Smoking cigarettes is extremely bad for your health. Tobacco smoke has over 200 known poisons in it. It contains the poisonous gases nitrogen oxide and carbon monoxide. There are over 60 chemicals in tobacco smoke that cause cancer. Some of the chemicals found in cigarette smoke include:   Cyanide.   Benzene.   Formaldehyde.   Methanol (wood alcohol).   Acetylene (fuel used in welding torches).   Ammonia.  Even smoking lightly shortens your life expectancy by several years. You can greatly reduce the risk of medical problems for you and your family by stopping now. Smoking is the most preventable cause of death and disease in our society. Within days of quitting smoking, your circulation improves, you decrease the risk of having a heart attack, and your lung capacity improves. There may be some increased phlegm in the first few days  after quitting, and it may take months for your lungs to clear up completely. Quitting for 10 years reduces your risk of developing lung cancer to almost that of a nonsmoker.  WHAT ARE THE RISKS OF SMOKING? Cigarette smokers have an increased risk of many serious medical problems, including:  Lung cancer.   Lung disease (such as pneumonia, bronchitis, and emphysema).   Heart attack and chest pain due to the heart not getting enough oxygen (angina).   Heart disease and peripheral blood vessel disease.   Hypertension.   Stroke.   Oral cancer (cancer of the lip, mouth, or voice box).   Bladder cancer.   Pancreatic cancer.   Cervical cancer.   Pregnancy complications, including premature birth.   Stillbirths and smaller newborn babies, birth defects, and genetic damage to sperm.   Early menopause.   Lower estrogen level for women.   Infertility.   Facial wrinkles.   Blindness.   Increased risk  of broken bones (fractures).   Senile dementia.   Stomach ulcers and internal bleeding.   Delayed wound healing and increased risk of complications during surgery. Because of secondhand smoke exposure, children of smokers have an increased risk of the following:   Sudden infant death syndrome (SIDS).   Respiratory infections.   Lung cancer.   Heart disease.   Ear infections.  WHY IS SMOKING ADDICTIVE? Nicotine is the chemical agent in tobacco that is capable of causing addiction or dependence. When you smoke and inhale, nicotine is absorbed rapidly into the bloodstream through your lungs. Both inhaled and noninhaled nicotine may be addictive.  WHAT ARE THE BENEFITS OF QUITTING?  There are many health benefits to quitting smoking. Some are:   The likelihood of developing cancer and heart disease decreases. Health improvements are seen almost immediately.   Blood pressure, pulse rate, and breathing patterns start returning to normal soon after  quitting.   People who quit may see an improvement in their overall quality of life.  HOW DO YOU QUIT SMOKING? Smoking is an addiction with both physical and psychological effects, and longtime habits can be hard to change. Your health care provider can recommend:  Programs and community resources, which may include group support, education, or therapy.  Replacement products, such as patches, gum, and nasal sprays. Use these products only as directed. Do not replace cigarette smoking with electronic cigarettes (commonly called e-cigarettes). The safety of e-cigarettes is unknown, and some may contain harmful chemicals. FOR MORE INFORMATION  American Lung Association: www.lung.org  American Cancer Society: www.cancer.org Document Released: 10/01/2004 Document Revised: 06/14/2013 Document Reviewed: 02/13/2013 [facility] Patient Information 2015 [place], [place]. This information is not intended to replace advice given to you by your health care provider. Make sure you discuss any questions you have with your health care provider.    Hyperglycemia Hyperglycemia occurs when the glucose (sugar) in your blood is too high. Hyperglycemia can happen for many reasons, but it most often happens to people who do not know they have diabetes or are not managing their diabetes properly.  CAUSES  Whether you have diabetes or not, there are other causes of hyperglycemia. Hyperglycemia can occur when you have diabetes, but it can also occur in other situations that you might not be as aware of, such as: Diabetes  If you have diabetes and are having problems controlling your blood glucose, hyperglycemia could occur because of some of the following reasons:  Not following your meal plan.  Not taking your diabetes medications or not taking it properly.  Exercising less or doing less activity than you normally do.  Being sick. Pre-diabetes  This cannot be ignored. Before people develop Type 2 diabetes,  they almost always have "pre-diabetes." This is when your blood glucose levels are higher than normal, but not yet high enough to be diagnosed as diabetes. Research has shown that some long-term damage to the body, especially the heart and circulatory system, may already be occurring during pre-diabetes. If you take action to manage your blood glucose when you have pre-diabetes, you may delay or prevent Type 2 diabetes from developing. Stress  If you have diabetes, you may be "diet" controlled or on oral medications or insulin to control your diabetes. However, you may find that your blood glucose is higher than usual in the hospital whether you have diabetes or not. This is often referred to as "stress hyperglycemia." Stress can elevate your blood glucose. This happens because of hormones put out by  the body during times of stress. If stress has been the cause of your high blood glucose, it can be followed regularly by your caregiver. That way he/she can make sure your hyperglycemia does not continue to get worse or progress to diabetes. Steroids  Steroids are medications that act on the infection fighting system (immune system) to block inflammation or infection. One side effect can be a rise in blood glucose. Most people can produce enough extra insulin to allow for this rise, but for those who cannot, steroids make blood glucose levels go even higher. It is not unusual for steroid treatments to "uncover" diabetes that is developing. It is not always possible to determine if the hyperglycemia will go away after the steroids are stopped. A special blood test called an A1c is sometimes done to determine if your blood glucose was elevated before the steroids were started. SYMPTOMS  Thirsty.  Frequent urination.  Dry mouth.  Blurred vision.  Tired or fatigue.  Weakness.  Sleepy.  Tingling in feet or leg. DIAGNOSIS  Diagnosis is made by monitoring blood glucose in one or all of the following  ways:  A1c test. This is a chemical found in your blood.  Fingerstick blood glucose monitoring.  Laboratory results. TREATMENT  First, knowing the cause of the hyperglycemia is important before the hyperglycemia can be treated. Treatment may include, but is not be limited to:  Education.  Change or adjustment in medications.  Change or adjustment in meal plan.  Treatment for an illness, infection, etc.  More frequent blood glucose monitoring.  Change in exercise plan.  Decreasing or stopping steroids.  Lifestyle changes. HOME CARE INSTRUCTIONS   Test your blood glucose as directed.  Exercise regularly. Your caregiver will give you instructions about exercise. Pre-diabetes or diabetes which comes on with stress is helped by exercising.  Eat wholesome, balanced meals. Eat often and at regular, fixed times. Your caregiver or nutritionist will give you a meal plan to guide your sugar intake.  Being at an ideal weight is important. If needed, losing as little as 10 to 15 pounds may help improve blood glucose levels. SEEK MEDICAL CARE IF:   You have questions about medicine, activity, or diet.  You continue to have symptoms (problems such as increased thirst, urination, or weight gain). SEEK IMMEDIATE MEDICAL CARE IF:   You are vomiting or have diarrhea.  Your breath smells fruity.  You are breathing faster or slower.  You are very sleepy or incoherent.  You have numbness, tingling, or pain in your feet or hands.  You have chest pain.  Your symptoms get worse even though you have been following your caregiver's orders.  If you have any other questions or concerns. Document Released: 02/17/2001 Document Revised: 11/16/2011 Document Reviewed: 12/21/2011 [facility] Patient Information 2015 [place], [place]. This information is not intended to replace advice given to you by your health care provider. Make sure you discuss any questions you have with your health care  provider.    Diabetes Mellitus and Food It is important for you to manage your blood sugar (glucose) level. Your blood glucose level can be greatly affected by what you eat. Eating healthier foods in the appropriate amounts throughout the day at about the same time each day will help you control your blood glucose level. It can also help slow or prevent worsening of your diabetes mellitus. Healthy eating may even help you improve the level of your blood pressure and reach or maintain a healthy weight.  HOW CAN FOOD AFFECT ME? Carbohydrates Carbohydrates affect your blood glucose level more than any other type of food. Your dietitian will help you determine how many carbohydrates to eat at each meal and teach you how to count carbohydrates. Counting carbohydrates is important to keep your blood glucose at a healthy level, especially if you are using insulin or taking certain medicines for diabetes mellitus. Alcohol Alcohol can cause sudden decreases in blood glucose (hypoglycemia), especially if you use insulin or take certain medicines for diabetes mellitus. Hypoglycemia can be a life-threatening condition. Symptoms of hypoglycemia (sleepiness, dizziness, and disorientation) are similar to symptoms of having too much alcohol.  If your health care provider has given you approval to drink alcohol, do so in moderation and use the following guidelines:  Women should not have more than one drink per day, and men should not have more than two drinks per day. One drink is equal to:  12 oz of beer.  5 oz of wine.  1 oz of hard liquor.  Do not drink on an empty stomach.  Keep yourself hydrated. Have water, diet soda, or unsweetened iced tea.  Regular soda, juice, and other mixers might contain a lot of carbohydrates and should be counted. WHAT FOODS ARE NOT RECOMMENDED? As you make food choices, it is important to remember that all foods are not the same. Some foods have fewer nutrients per  serving than other foods, even though they might have the same number of calories or carbohydrates. It is difficult to get your body what it needs when you eat foods with fewer nutrients. Examples of foods that you should avoid that are high in calories and carbohydrates but low in nutrients include:  Trans fats (most processed foods list trans fats on the Nutrition Facts label).  Regular soda.  Juice.  Candy.  Sweets, such as cake, pie, doughnuts, and cookies.  Fried foods. WHAT FOODS CAN I EAT? Have nutrient-rich foods, which will nourish your body and keep you healthy. The food you should eat also will depend on several factors, including:  The calories you need.  The medicines you take.  Your weight.  Your blood glucose level.  Your blood pressure level.  Your cholesterol level. You also should eat a variety of foods, including:  Protein, such as meat, poultry, fish, tofu, nuts, and seeds (lean animal proteins are best).  Fruits.  Vegetables.  Dairy products, such as milk, cheese, and yogurt (low fat is best).  Breads, grains, pasta, cereal, rice, and beans.  Fats such as olive oil, trans fat-free margarine, canola oil, avocado, and olives. DOES EVERYONE WITH DIABETES MELLITUS HAVE THE SAME MEAL PLAN? Because every person with diabetes mellitus is different, there is not one meal plan that works for everyone. It is very important that you meet with a dietitian who will help you create a meal plan that is just right for you. Document Released: 05/21/2005 Document Revised: 08/29/2013 Document Reviewed: 07/21/2013 [facility] Patient Information 2015 [place], [place]. This information is not intended to replace advice given to you by your health care provider. Make sure you discuss any questions you have with your health care provider.     Hypokalemia Hypokalemia means that the amount of potassium in the blood is lower than normal.Potassium is a chemical, called an  electrolyte, that helps regulate the amount of fluid in the body. It also stimulates muscle contraction and helps nerves function properly.Most of the body's potassium is inside of cells, and only a very  small amount is in the blood. Because the amount in the blood is so small, minor changes can be life-threatening. CAUSES  Antibiotics.  Diarrhea or vomiting.  Using laxatives too much, which can cause diarrhea.  Chronic kidney disease.  Water pills (diuretics).  Eating disorders (bulimia).  Low magnesium level.  Sweating a lot. SIGNS AND SYMPTOMS  Weakness.  Constipation.  Fatigue.  Muscle cramps.  Mental confusion.  Skipped heartbeats or irregular heartbeat (palpitations).  Tingling or numbness. DIAGNOSIS  Your health care provider can diagnose hypokalemia with blood tests. In addition to checking your potassium level, your health care provider may also check other lab tests. TREATMENT Hypokalemia can be treated with potassium supplements taken by mouth or adjustments in your current medicines. If your potassium level is very low, you may need to get potassium through a vein (IV) and be monitored in the hospital. A diet high in potassium is also helpful. Foods high in potassium are:  Nuts, such as peanuts and pistachios.  Seeds, such as sunflower seeds and pumpkin seeds.  Peas, lentils, and lima beans.  Whole grain and bran cereals and breads.  Fresh fruit and vegetables, such as apricots, avocado, bananas, cantaloupe, kiwi, oranges, tomatoes, asparagus, and potatoes.  Orange and tomato juices.  Red meats.  Fruit yogurt. HOME CARE INSTRUCTIONS  Take all medicines as prescribed by your health care provider.  Maintain a healthy diet by including nutritious food, such as fruits, vegetables, nuts, whole grains, and lean meats.  If you are taking a laxative, be sure to follow the directions on the label. SEEK MEDICAL CARE IF:  Your weakness gets  worse.  You feel your heart pounding or racing.  You are vomiting or having diarrhea.  You are diabetic and having trouble keeping your blood glucose in the normal range. SEEK IMMEDIATE MEDICAL CARE IF:  You have chest pain, shortness of breath, or dizziness.  You are vomiting or having diarrhea for more than 2 days.  You faint. MAKE SURE YOU:   Understand these instructions.  Will watch your condition.  Will get help right away if you are not doing well or get worse. Document Released: 08/24/2005 Document Revised: 06/14/2013 Document Reviewed: 02/24/2013 [facility] Patient Information 2015 [place], [place]. This information is not intended to replace advice given to you by your health care provider. Make sure you discuss any questions you have with your health care provider.

## 2014-07-31 LAB — TROPONIN I: Troponin I: <0.3 ng/mL (ref <0.30)

## 2014-08-16 ENCOUNTER — Encounter (HOSPITAL_COMMUNITY): Payer: Self-pay | Admitting: Cardiovascular Disease

## 2014-09-14 ENCOUNTER — Emergency Department (HOSPITAL_COMMUNITY)
Admission: EM | Admit: 2014-09-14 | Discharge: 2014-09-14 | Disposition: A | Discharge status: Home / Self Care | Payer: Worker's Compensation | Attending: Emergency Medicine | Admitting: Emergency Medicine

## 2014-09-14 ENCOUNTER — Emergency Department (HOSPITAL_COMMUNITY): Payer: Worker's Compensation

## 2014-09-14 ENCOUNTER — Encounter (HOSPITAL_COMMUNITY): Payer: Self-pay | Admitting: *Deleted

## 2014-09-14 DIAGNOSIS — J45909 Unspecified asthma, uncomplicated: Secondary | ICD-10-CM | POA: Insufficient documentation

## 2014-09-14 DIAGNOSIS — F419 Anxiety disorder, unspecified: Secondary | ICD-10-CM | POA: Diagnosis not present

## 2014-09-14 DIAGNOSIS — Z8719 Personal history of other diseases of the digestive system: Secondary | ICD-10-CM | POA: Insufficient documentation

## 2014-09-14 DIAGNOSIS — Z79899 Other long term (current) drug therapy: Secondary | ICD-10-CM | POA: Insufficient documentation

## 2014-09-14 DIAGNOSIS — Z7982 Long term (current) use of aspirin: Secondary | ICD-10-CM | POA: Insufficient documentation

## 2014-09-14 DIAGNOSIS — M1990 Unspecified osteoarthritis, unspecified site: Secondary | ICD-10-CM | POA: Diagnosis not present

## 2014-09-14 DIAGNOSIS — R0789 Other chest pain: Secondary | ICD-10-CM | POA: Diagnosis not present

## 2014-09-14 DIAGNOSIS — R079 Chest pain, unspecified: Secondary | ICD-10-CM | POA: Diagnosis present

## 2014-09-14 DIAGNOSIS — E119 Type 2 diabetes mellitus without complications: Secondary | ICD-10-CM | POA: Insufficient documentation

## 2014-09-14 DIAGNOSIS — I2510 Atherosclerotic heart disease of native coronary artery without angina pectoris: Secondary | ICD-10-CM | POA: Diagnosis not present

## 2014-09-14 DIAGNOSIS — Z794 Long term (current) use of insulin: Secondary | ICD-10-CM | POA: Diagnosis not present

## 2014-09-14 DIAGNOSIS — Z720 Tobacco use: Secondary | ICD-10-CM | POA: Insufficient documentation

## 2014-09-14 DIAGNOSIS — E785 Hyperlipidemia, unspecified: Secondary | ICD-10-CM | POA: Diagnosis not present

## 2014-09-14 DIAGNOSIS — Z880 Allergy status to penicillin: Secondary | ICD-10-CM | POA: Diagnosis not present

## 2014-09-14 DIAGNOSIS — F329 Major depressive disorder, single episode, unspecified: Secondary | ICD-10-CM | POA: Diagnosis not present

## 2014-09-14 DIAGNOSIS — I252 Old myocardial infarction: Secondary | ICD-10-CM | POA: Diagnosis not present

## 2014-09-14 DIAGNOSIS — E669 Obesity, unspecified: Secondary | ICD-10-CM | POA: Diagnosis not present

## 2014-09-14 LAB — CBC
HCT: 37.2 % (ref 36.0–46.0)
HEMOGLOBIN: 12.9 g/dL (ref 12.0–15.0)
MCH: 26.5 pg (ref 26.0–34.0)
MCHC: 34.7 g/dL (ref 30.0–36.0)
MCV: 76.5 fL — AB (ref 78.0–100.0)
Platelets: 268 10*3/uL (ref 150–400)
RBC: 4.86 MIL/uL (ref 3.87–5.11)
RDW: 14.5 % (ref 11.5–15.5)
WBC: 8.5 10*3/uL (ref 4.0–10.5)

## 2014-09-14 LAB — BASIC METABOLIC PANEL
ANION GAP: 13 (ref 5–15)
BUN: 7 mg/dL (ref 6–23)
CALCIUM: 8.6 mg/dL (ref 8.4–10.5)
CO2: 21 mmol/L (ref 19–32)
Chloride: 101 mEq/L (ref 96–112)
Creatinine, Ser: 0.57 mg/dL (ref 0.50–1.10)
GFR calc Af Amer: >90 mL/min (ref >90)
GFR calc non Af Amer: >90 mL/min (ref >90)
GLUCOSE: 306 mg/dL — AB (ref 70–99)
POTASSIUM: 3.7 mmol/L (ref 3.5–5.1)
Sodium: 135 mmol/L (ref 135–145)

## 2014-09-14 LAB — I-STAT TROPONIN, ED: Troponin i, poc: 0 ng/mL (ref 0.00–0.08)

## 2014-09-14 MED ORDER — INSULIN ASPART 100 UNIT/ML ~~LOC~~ SOLN
10.0000 [IU] | Freq: Once | SUBCUTANEOUS | Status: AC
  Administered 2014-09-14: 10 [IU] via SUBCUTANEOUS
  Filled 2014-09-14: qty 1

## 2014-09-14 NOTE — Discharge Instructions (Signed)

## 2014-09-14 NOTE — ED Provider Notes (Signed)
CSN: [id number]     Arrival date & time 09/14/14  1452 History   First MD Initiated Contact with Patient 09/14/14 1559     Chief Complaint  Patient presents with  . Chest Pain     (Consider location/radiation/quality/duration/timing/severity/associated sxs/prior Treatment) HPI   39 year old obese female with history of CAD, prior MI status post drug-eluting stent on ticagrelor, diabetes, asthma, anxiety, GERD presenting via EMS with complaints of chest pain. 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 Described the pain as a sharp and tearing sensation like a muscle pulled, state pain was 3 out of 10. She did take one sibling with nitroglycerin and aspirin and the pain shortly resolved. Denies any associated lightheadedness these and is diaphoresis shortness of breath with chest pain. Pain does not feel like a prior MI. She is certain that the pain is related to her muscle pulled.   no associated fever cough abdominal pain numbness or weakness. Given her history of cardiac disease her colleagues urge her to come to the ER for further evaluation. Patient states she is currently chest pain-free and has no other symptoms. She would like to be discharge.        Past Medical History  Diagnosis Date  . Diabetes mellitus     2002; no meds since 2004  . Acute myocardial infarction of other anterior wall, initial episode of care   . Paroxysmal ventricular tachycardia   . Obesity   . Dyslipidemia, goal LDL below 70 10/26/2013  . STEMI (ST elevation myocardial infarction)   . CAD (coronary artery disease)   . Arthritis   . Dysrhythmia   . Anginal pain   . Asthma   . Shortness of breath   . Depression   . Anxiety   . GERD (gastroesophageal reflux disease)   . Headache(784.0)   . Tobacco abuse     Past Surgical History  Procedure Laterality Date  . Cesarean section    . Gallbladder surgery    . Tubal ligation      2000  . Cesarean section  [id number]  . Coronary angioplasty with stent placement    . Left heart cath N/A 10/24/2013    Procedure: LEFT HEART CATH;  Surgeon: Lorretta Harp, MD;  Location: [facility] CATH LAB;  Service: Cardiovascular;  Laterality: N/A;  . Left heart catheterization with coronary angiogram N/A 10/26/2013    Procedure: LEFT HEART CATHETERIZATION WITH CORONARY ANGIOGRAM;  Surgeon: Burnell Blanks, MD;  Location: [facility] CATH LAB;  Service: Cardiovascular;  Laterality: N/A;  . Left heart catheterization with coronary angiogram N/A 03/29/2014    Procedure: LEFT HEART CATHETERIZATION WITH CORONARY ANGIOGRAM;  Surgeon: Lorretta Harp, MD;  Location: [facility] CATH LAB;  Service: Cardiovascular;  Laterality: N/A;   Family History  Problem Relation Age of Onset  . Arthritis Mother   . Diabetes Mother   . Heart disease Father   . Stroke Father   . Cancer Neg Hx   . Alcohol abuse Neg Hx   . Early death Neg Hx   . Kidney disease Neg Hx   . Hypertension Neg Hx   . Hyperlipidemia Neg Hx   . Diabetes Maternal Grandfather    History  Substance Use Topics  . Smoking status: Current Every Day Smoker --  0.50 packs/day for 20 years    Types: Cigarettes  . Smokeless tobacco: Never Used  . Alcohol Use: No     Comment: occasional   OB History    Gravida Para Term Preterm AB TAB SAB Ectopic Multiple Living   4 3 3  1  1   3      Review of Systems  All other systems reviewed and are negative.      Allergies  Erythromycin; Penicillins; Metformin and related; and Viibryd  Home Medications   Prior to Admission medications   Medication Sig Start Date End Date Taking? Authorizing Provider  ALPRAZolam Duanne Moron) 0.5 MG tablet Take 1 tablet (0.5 mg total) by mouth at bedtime as needed for anxiety. 06/14/14   [name], MD  aspirin EC 81 MG tablet Take 81 mg by  mouth daily.    Historical Provider, MD  atorvastatin (LIPITOR) 80 MG tablet Take 80 mg by mouth daily.    Historical Provider, MD  Biotin 1000 MCG tablet Take 1,000 mcg by mouth 2 (two) times daily.    Historical Provider, MD  Canagliflozin (INVOKANA) 300 MG TABS Take 1 tablet (300 mg total) by mouth daily. 06/14/14   [name], MD  HYDROcodone-acetaminophen ([place]) 10-325 MG per tablet Take 1 tablet by mouth every 8 (eight) hours as needed. 07/19/14   [name], MD  Insulin Glargine (TOUJEO SOLOSTAR) 300 UNIT/ML SOPN Inject 50 Units into the skin daily. 06/14/14   [name], MD  Liraglutide (VICTOZA) 18 MG/3ML SOPN Inject 1.8 mg into the skin daily. 06/14/14   [name], MD  Multiple Vitamins-Minerals (MULTIVITAMIN PO) Take 1 tablet by mouth daily.    Historical Provider, MD  nitroGLYCERIN (NITROSTAT) 0.4 MG SL tablet Place 0.4 mg under the tongue every 5 (five) minutes as needed for chest pain.    Historical Provider, MD  ticagrelor (BRILINTA) 90 MG TABS tablet Take 1 tablet (90 mg total) by mouth 2 (two) times daily. 04/20/14   Lorretta Harp, MD   BP 117/70 mmHg  Pulse 86  Temp(Src) 98 F (36.7 C) (Oral)  Resp 23  SpO2 98% Physical Exam  Constitutional: She appears well-developed and well-nourished. No distress.  Moderately obese after marking female sitting upright in bed appears to be in no acute distress, talking and joking with her family member who is in the room.  HENT:  Head: Atraumatic.  Mouth/Throat: Oropharynx is clear and moist.  Eyes: Conjunctivae are normal.  Neck: Neck supple.  Cardiovascular: Normal rate and regular rhythm.   Pulmonary/Chest: Effort normal and breath sounds normal. She exhibits tenderness (Mild left chest tenderness to palpation without any overlying skin changes, crepitus, or emphysema.).  Abdominal: Soft.  Musculoskeletal: She exhibits no edema.  Neurological: She is alert.  Skin: No rash noted.  Psychiatric: She has a normal  mood and affect.  Nursing note and vitals reviewed.   ED Course  Procedures (including critical care time)  patient with history of prior MI, LAD occlusion, stents in February 2015, to subsequent chest pain rule out admission and also a recent heart cath in September with a patent stent. She is here with reproducible chest wall pain and a mechanism to explain her pain. Pain does not sounds cardiac. She is currently chest pain-free. She request to be discharged.  HEART score of 2.  Low risk for ACS  5:02 PM Patient is hypoglycemic with a blood sugar of 306, normal anion gap. Patient admits that she  did not take her insulin today and ate prior to arrival. I expressed the importance of taking her medication appropriately, patient acknowledge.  5:20 PM Care discussed with Dr. Sabra Heck who evaluate pt and felt pt stable for discharge.  Pt to f/u with PCP or cardiologist as needed. Return precaution discussed.  Labs Review Labs Reviewed  BASIC METABOLIC PANEL - Abnormal; Notable for the following:    Glucose, Bld 306 (*)    All other components within normal limits  CBC - Abnormal; Notable for the following:    MCV 76.5 (*)    All other components within normal limits  I-STAT TROPOININ, ED    Imaging Review Dg Chest 2 View  09/14/2014   CLINICAL DATA:  Chest pain while lifting  EXAM: CHEST  2 VIEW  COMPARISON:  July 30, 2014  FINDINGS: Lungs are clear. Heart size and pulmonary vascularity are normal. No pneumothorax. No adenopathy. No bone lesions.  IMPRESSION: No abnormality noted.   Electronically Signed   By: Lowella Grip M.D.   On: 09/14/2014 16:09     EKG Interpretation   Date/Time:  Friday September 14 2014 14:59:25 EST Ventricular Rate:  89 PR Interval:  189 QRS Duration: 90 QT Interval:  344 QTC Calculation: 418 R Axis:   45 Text Interpretation:  Sinus rhythm Low voltage, precordial leads  Anteroseptal infarct, old No significant change since last tracing  Confirmed  by Winfred Leeds  MD, SAM [phone]) on 09/14/2014 3:15:09 PM      MDM   Final diagnoses:  Chest pain  Chest wall pain    BP 117/70 mmHg  Pulse 86  Temp(Src) 98 F (36.7 C) (Oral)  Resp 23  SpO2 98%  LMP   I have reviewed nursing notes and vital signs. I personally reviewed the imaging tests through PACS system  I reviewed available ER/hospitalization records thought the EMR     Domenic Moras, PA-C 09/14/14 1721  Johnna Acosta, MD 09/15/14 [phone]

## 2014-09-14 NOTE — ED Notes (Addendum)
Lt. Sided chest pain ("felt like a tear") after lifting a patient. 3/10 pain; relieved by x 1 ntg sl. 0.4 mg given by EMS; EMS also gave 324 mg of asa. Hx. Of stent. VSS.

## 2014-09-14 NOTE — ED Provider Notes (Signed)
The patient is a 39 year old female, she does have a prior history of cardiac disease but has not been having any symptoms recently. While she was at work lifting a very heavy patient with 5 other assistance she felt pain as she was pulling, the pain was associated with lifting movement, it is not associated with exertion or position that she has no other symptoms including shortness of breath fevers chills or swelling of the legs. On exam the patient has normal heart and lung sounds, no swelling of the lower extremities and an unchanged EKG which shows poor R-wave progression similar to prior EKGs. This does not sound cardiac in nature the way she describes it nor on her physical exam, she appears stable for discharge on anti-inflammatories. The plan was described to the patient in detail who is in complete agreement with the plan and requests discharge.  Medical screening examination/treatment/procedure(s) were conducted as a shared visit with non-physician practitioner(s) and myself.  I personally evaluated the patient during the encounter.  Clinical Impression:   Final diagnoses:  Chest pain  Chest wall pain         Johnna Acosta, MD 09/15/14 [phone]

## 2014-09-19 ENCOUNTER — Emergency Department (HOSPITAL_COMMUNITY)
Admission: EM | Admit: 2014-09-19 | Discharge: 2014-09-19 | Disposition: A | Discharge status: Home / Self Care | Payer: 59 | Attending: Emergency Medicine | Admitting: Emergency Medicine

## 2014-09-19 ENCOUNTER — Encounter (HOSPITAL_COMMUNITY): Payer: Self-pay | Admitting: Emergency Medicine

## 2014-09-19 DIAGNOSIS — F419 Anxiety disorder, unspecified: Secondary | ICD-10-CM | POA: Diagnosis not present

## 2014-09-19 DIAGNOSIS — E785 Hyperlipidemia, unspecified: Secondary | ICD-10-CM | POA: Insufficient documentation

## 2014-09-19 DIAGNOSIS — Z79899 Other long term (current) drug therapy: Secondary | ICD-10-CM | POA: Insufficient documentation

## 2014-09-19 DIAGNOSIS — Z7982 Long term (current) use of aspirin: Secondary | ICD-10-CM | POA: Insufficient documentation

## 2014-09-19 DIAGNOSIS — M1990 Unspecified osteoarthritis, unspecified site: Secondary | ICD-10-CM | POA: Diagnosis not present

## 2014-09-19 DIAGNOSIS — J45901 Unspecified asthma with (acute) exacerbation: Secondary | ICD-10-CM | POA: Diagnosis not present

## 2014-09-19 DIAGNOSIS — I25119 Atherosclerotic heart disease of native coronary artery with unspecified angina pectoris: Secondary | ICD-10-CM | POA: Diagnosis not present

## 2014-09-19 DIAGNOSIS — E669 Obesity, unspecified: Secondary | ICD-10-CM | POA: Diagnosis not present

## 2014-09-19 DIAGNOSIS — Z9889 Other specified postprocedural states: Secondary | ICD-10-CM | POA: Insufficient documentation

## 2014-09-19 DIAGNOSIS — Z794 Long term (current) use of insulin: Secondary | ICD-10-CM | POA: Insufficient documentation

## 2014-09-19 DIAGNOSIS — Z8719 Personal history of other diseases of the digestive system: Secondary | ICD-10-CM | POA: Insufficient documentation

## 2014-09-19 DIAGNOSIS — R079 Chest pain, unspecified: Secondary | ICD-10-CM | POA: Diagnosis present

## 2014-09-19 DIAGNOSIS — Z880 Allergy status to penicillin: Secondary | ICD-10-CM | POA: Insufficient documentation

## 2014-09-19 DIAGNOSIS — Z91199 Patient's noncompliance with other medical treatment and regimen due to unspecified reason: Secondary | ICD-10-CM

## 2014-09-19 DIAGNOSIS — Z720 Tobacco use: Secondary | ICD-10-CM | POA: Diagnosis not present

## 2014-09-19 DIAGNOSIS — F329 Major depressive disorder, single episode, unspecified: Secondary | ICD-10-CM | POA: Diagnosis not present

## 2014-09-19 DIAGNOSIS — Z9861 Coronary angioplasty status: Secondary | ICD-10-CM | POA: Insufficient documentation

## 2014-09-19 DIAGNOSIS — E1165 Type 2 diabetes mellitus with hyperglycemia: Secondary | ICD-10-CM | POA: Insufficient documentation

## 2014-09-19 DIAGNOSIS — Z9119 Patient's noncompliance with other medical treatment and regimen: Secondary | ICD-10-CM | POA: Insufficient documentation

## 2014-09-19 DIAGNOSIS — R739 Hyperglycemia, unspecified: Secondary | ICD-10-CM

## 2014-09-19 DIAGNOSIS — R0789 Other chest pain: Secondary | ICD-10-CM

## 2014-09-19 DIAGNOSIS — I252 Old myocardial infarction: Secondary | ICD-10-CM | POA: Diagnosis not present

## 2014-09-19 LAB — BASIC METABOLIC PANEL
ANION GAP: 8 (ref 5–15)
BUN: 7 mg/dL (ref 6–23)
CHLORIDE: 100 meq/L (ref 96–112)
CO2: 23 mmol/L (ref 19–32)
Calcium: 8.3 mg/dL — ABNORMAL LOW (ref 8.4–10.5)
Creatinine, Ser: 0.51 mg/dL (ref 0.50–1.10)
GFR calc non Af Amer: >90 mL/min (ref >90)
Glucose, Bld: 325 mg/dL — ABNORMAL HIGH (ref 70–99)
POTASSIUM: 3.6 mmol/L (ref 3.5–5.1)
SODIUM: 131 mmol/L — AB (ref 135–145)

## 2014-09-19 LAB — CBG MONITORING, ED
Glucose-Capillary: 349 mg/dL — ABNORMAL HIGH (ref 70–99)
Glucose-Capillary: 274 mg/dL — ABNORMAL HIGH (ref 70–99)

## 2014-09-19 MED ORDER — ONDANSETRON HCL 4 MG/2ML IJ SOLN
4.0000 mg | Freq: Once | INTRAMUSCULAR | Status: AC
  Administered 2014-09-19: 4 mg via INTRAVENOUS
  Filled 2014-09-19: qty 2

## 2014-09-19 MED ORDER — HYDROCODONE-ACETAMINOPHEN 5-325 MG PO TABS: 1.0000 | ORAL_TABLET | Freq: Four times a day (QID) | ORAL | Status: DC | PRN

## 2014-09-19 MED ORDER — OXYCODONE-ACETAMINOPHEN 5-325 MG PO TABS
1.0000 | ORAL_TABLET | Freq: Once | ORAL | Status: AC
  Administered 2014-09-19: 1 via ORAL
  Filled 2014-09-19: qty 1

## 2014-09-19 MED ORDER — MORPHINE SULFATE 4 MG/ML IJ SOLN
4.0000 mg | Freq: Once | INTRAMUSCULAR | Status: AC
  Administered 2014-09-19: 4 mg via INTRAVENOUS
  Filled 2014-09-19: qty 1

## 2014-09-19 MED ORDER — IBUPROFEN 800 MG PO TABS: 800.0000 mg | ORAL_TABLET | Freq: Three times a day (TID) | ORAL | Status: DC

## 2014-09-19 MED ORDER — SODIUM CHLORIDE 0.9 % IV BOLUS (SEPSIS)
1000.0000 mL | Freq: Once | INTRAVENOUS | Status: AC
  Administered 2014-09-19: 1000 mL via INTRAVENOUS

## 2014-09-19 MED ORDER — ASPIRIN 325 MG PO TABS
325.0000 mg | ORAL_TABLET | ORAL | Status: DC
Start: 2014-09-19 — End: 2014-09-19

## 2014-09-19 NOTE — ED Notes (Signed)
Patient coming from home with c/o of left sided chest pain.  The chest pain goes in to the left shoulder.  Patient was moving a patient on Friday and strained herself moving a patient, was brought in to the ED for those complains of muscle strain.  Patient thinks the pain has gotten worse since the incident on Friday.

## 2014-09-19 NOTE — ED Notes (Signed)
Pt received 4mg  of IV morphine

## 2014-09-19 NOTE — ED Provider Notes (Signed)
CSN: [id number]     Arrival date & time 09/19/14  0814 History   First MD Initiated Contact with Patient 09/19/14 [phone]     Chief Complaint  Patient presents with  . Chest Pain     (Consider location/radiation/quality/duration/timing/severity/associated sxs/prior Treatment) HPI Comments: 39 year old obese female with history of CAD, prior MI status post drug-eluting stent on ticagrelor, diabetes, asthma, anxiety, GERD presenting to the Emergency Department with a chief complaint of chest discomfort for 6 days.  The patient reports onset of chest discomfort while pulling a very large patient at work. The patient describes it as a muscular pain/pulling.  The patient reports discomfort with deep inspiration, left arm movement and position. Patient reports taking Tylenol, last dose 2 days ago. Patient reports using Biofreeze and heating pad without resolution of symptoms. Patient reports occasional shortness of breath, denies lower extremity edema. Patient reports CBG over 300, last Invokana last night last insulin 4 days ago.     Patient is a 39 y.o. female presenting with chest pain. The history is provided by the patient. No language interpreter was used.  Chest Pain Associated symptoms: shortness of breath   Associated symptoms: no abdominal pain, no cough and no palpitations     Past Medical History  Diagnosis Date  . Diabetes mellitus     2002; no meds since 2004  . Acute myocardial infarction of other anterior wall, initial episode of care   . Paroxysmal ventricular tachycardia   . Obesity   . Dyslipidemia, goal LDL below 70 10/26/2013  . STEMI (ST elevation myocardial infarction)   . CAD (coronary artery disease)   . Arthritis   . Dysrhythmia   . Anginal pain   . Asthma   . Shortness of breath   . Depression   . Anxiety   . GERD (gastroesophageal reflux disease)   . Headache(784.0)   . Tobacco abuse    Past Surgical History  Procedure Laterality Date  . Cesarean section     . Gallbladder surgery    . Tubal ligation      2000  . Cesarean section  [id number]  . Coronary angioplasty with stent placement    . Left heart cath N/A 10/24/2013    Procedure: LEFT HEART CATH;  Surgeon: Lorretta Harp, MD;  Location: [facility] CATH LAB;  Service: Cardiovascular;  Laterality: N/A;  . Left heart catheterization with coronary angiogram N/A 10/26/2013    Procedure: LEFT HEART CATHETERIZATION WITH CORONARY ANGIOGRAM;  Surgeon: Burnell Blanks, MD;  Location: [facility] CATH LAB;  Service: Cardiovascular;  Laterality: N/A;  . Left heart catheterization with coronary angiogram N/A 03/29/2014    Procedure: LEFT HEART CATHETERIZATION WITH CORONARY ANGIOGRAM;  Surgeon: Lorretta Harp, MD;  Location: [facility] CATH LAB;  Service: Cardiovascular;  Laterality: N/A;   Family History  Problem Relation Age of Onset  . Arthritis Mother   . Diabetes Mother   . Heart disease Father   . Stroke Father   . Cancer Neg Hx   . Alcohol abuse Neg Hx   . Early death Neg Hx   . Kidney disease Neg Hx   . Hypertension Neg Hx   . Hyperlipidemia Neg Hx   . Diabetes Maternal Grandfather    History  Substance Use Topics  . Smoking status: Current Every Day Smoker -- 0.50 packs/day for 20 years    Types: Cigarettes  . Smokeless tobacco: Never Used  . Alcohol Use: No     Comment: occasional   OB  History    Gravida Para Term Preterm AB TAB SAB Ectopic Multiple Living   4 3 3  1  1   3      Review of Systems  Respiratory: Positive for shortness of breath. Negative for cough.   Cardiovascular: Positive for chest pain. Negative for palpitations and leg swelling.  Gastrointestinal: Negative for abdominal pain.      Allergies  Erythromycin; Penicillins; Metformin and related; and Viibryd  Home Medications   Prior to Admission medications   Medication Sig Start Date End Date Taking? Authorizing Provider  ALPRAZolam Duanne Moron) 0.5 MG tablet Take 1 tablet (0.5 mg total) by mouth at bedtime as needed  for anxiety. 06/14/14   [name], MD  aspirin EC 81 MG tablet Take 81 mg by mouth daily.    Historical Provider, MD  atorvastatin (LIPITOR) 80 MG tablet Take 80 mg by mouth daily.    Historical Provider, MD  Biotin 1000 MCG tablet Take 1,000 mcg by mouth 2 (two) times daily.    Historical Provider, MD  Canagliflozin (INVOKANA) 300 MG TABS Take 1 tablet (300 mg total) by mouth daily. 06/14/14   [name], MD  HYDROcodone-acetaminophen ([place]) 10-325 MG per tablet Take 1 tablet by mouth every 8 (eight) hours as needed. 07/19/14   [name], MD  Insulin Glargine (TOUJEO SOLOSTAR) 300 UNIT/ML SOPN Inject 50 Units into the skin daily. 06/14/14   [name], MD  Liraglutide (VICTOZA) 18 MG/3ML SOPN Inject 1.8 mg into the skin daily. 06/14/14   [name], MD  Multiple Vitamins-Minerals (MULTIVITAMIN PO) Take 1 tablet by mouth daily.    Historical Provider, MD  nitroGLYCERIN (NITROSTAT) 0.4 MG SL tablet Place 0.4 mg under the tongue every 5 (five) minutes as needed for chest pain.    Historical Provider, MD  ticagrelor (BRILINTA) 90 MG TABS tablet Take 1 tablet (90 mg total) by mouth 2 (two) times daily. 04/20/14   Lorretta Harp, MD   There were no vitals taken for this visit. Physical Exam  Constitutional: She is oriented to person, place, and time. She appears well-developed and well-nourished. No distress.  Obese female  HENT:  Head: Normocephalic and atraumatic.  Neck: Neck supple.  Cardiovascular: Normal rate and regular rhythm.     Pulmonary/Chest: Effort normal and breath sounds normal. No respiratory distress. She has no wheezes. She has no rales. She exhibits tenderness.  Patient is able to speak in complete sentences.   Abdominal: Soft. There is no tenderness. There is no rebound and no guarding.  Musculoskeletal: Normal range of motion.  Neurological: She is alert and oriented to person, place, and time.  Skin: Skin is warm and dry. She is not diaphoretic.    Psychiatric: She has a normal mood and affect. Her behavior is normal.  Nursing note and vitals reviewed.   ED Course  Procedures (including critical care time) Labs Review Labs Reviewed  BASIC METABOLIC PANEL - Abnormal; Notable for the following:    Sodium 131 (*)    Glucose, Bld 325 (*)    Calcium 8.3 (*)    All other components within normal limits  CBG MONITORING, ED - Abnormal; Notable for the following:    Glucose-Capillary 349 (*)    All other components within normal limits  CBG MONITORING, ED - Abnormal; Notable for the following:    Glucose-Capillary 274 (*)    All other components within normal limits  CBG MONITORING, ED    Imaging Review  No results found.   EKG Interpretation   Date/Time:  Wednesday September 19 2014 06:39:58 EST Ventricular Rate:  94 PR Interval:  187 QRS Duration: 88 QT Interval:  347 QTC Calculation: 434 R Axis:   33 Text Interpretation:  Sinus rhythm Low voltage, precordial leads Probable  anteroseptal infarct, old Borderline T abnormalities, inferior leads No  significant change since last tracing Confirmed by [facility]  MD, BLAIR  [phone]) on 09/19/2014 6:42:45 AM      MDM   Final diagnoses:  Chest wall pain  Medical non-compliance  Hyperglycemia   Patient presents with chest wall pain reproducible with palpation of left anterior chest. Patient well-appearing, in no acute distress, normal heart and lung sounds, no lower extremity edema. EKG unchanged from prior. Patient was also evaluated 09/14/2014 for similar complaints with negative chest x-ray, EKG, troponin. Diagnosed with musculoskeletal pain, similar presentation today, EKG unchanged. Patient reports noncompliance with insulin, CBG was checked 349, fluids given will check for anion gap. BMP without a anion gap.  Second CBG 274. Patient will be discharged home with encouragement of continuing diabetes medication, ibuprofen and narcotic for chest wall pain.   5631 Re-eval pt upset  about the fact that she is not completely chest pain free, or that she was not admitted, or an extensive workup was not performed, stating all "I got was an EKG and an IV".  Discussed musculoskeletal in nature, need for follow-up with her primary care doctor, EKG was without changes, for workup on 09/14/2013 without any abnormalities. And to reorder all tests for reproducible chest wall pain is not indicated, despite her risk factors. Encouraged her to follow up with her cardiologist as she was concerned about cardiac related issues, encourage patient to take her insulin to reduce the chance of further cardiac complications and to take a medication as previously prescribed. Patient also is upset that she did not get more than one day off work for an injury 6 days ago.   Harvie Heck, PA-C 09/19/14 0945 Meds given in ED:  Medications  oxyCODONE-acetaminophen (PERCOCET/ROXICET) 5-325 MG per tablet 1 tablet (1 tablet Oral Given 09/19/14 0703)  sodium chloride 0.9 % bolus 1,000 mL (0 mLs Intravenous Stopped 09/19/14 0910)  ondansetron (ZOFRAN) injection 4 mg (4 mg Intravenous Given 09/19/14 0744)  oxyCODONE-acetaminophen (PERCOCET/ROXICET) 5-325 MG per tablet 1 tablet (1 tablet Oral Given 09/19/14 0940)  morphine 4 MG/ML injection 4 mg (4 mg Intravenous Given 09/19/14 1002)    New Prescriptions   HYDROCODONE-ACETAMINOPHEN (NORCO/VICODIN) 5-325 MG PER TABLET    Take 1 tablet by mouth every 6 (six) hours as needed for moderate pain or severe pain.   IBUPROFEN (ADVIL,MOTRIN) 800 MG TABLET    Take 1 tablet (800 mg total) by mouth 3 (three) times daily. Take with meals      Harvie Heck, PA-C 09/19/14 [place], MD 10/04/14 [phone]

## 2014-09-19 NOTE — ED Notes (Signed)
274 was patient blood sugar notified RN Hope of blood sugar

## 2014-09-19 NOTE — Discharge Instructions (Signed)
Take ibuprofen with food for your chest wall pain. Take narcotics for break through pain, do not operate heavy machinery, drink alcohol taking narcotic pain medication. Take your insulin and diabetic medication as prescribed. Follow-up through primary care doctor about your elevated glucose reading. Return if Symptoms worsen.   Take medication as prescribed.

## 2014-09-24 ENCOUNTER — Telehealth: Payer: Self-pay | Admitting: Internal Medicine

## 2014-09-24 NOTE — Telephone Encounter (Signed)
She needs to be seen.

## 2014-09-24 NOTE — Telephone Encounter (Signed)
appt set

## 2014-09-24 NOTE — Telephone Encounter (Signed)
Pt request refill for hydrocodone, pt stated she went to the ER for pulling muscle in chest area so she took what he got left ut Dr. Ronnald Ramp gave her this med for back pain. Please advise.

## 2014-09-27 ENCOUNTER — Ambulatory Visit (INDEPENDENT_AMBULATORY_CARE_PROVIDER_SITE_OTHER): Payer: 59 | Admitting: Internal Medicine

## 2014-09-27 ENCOUNTER — Encounter: Payer: Self-pay | Admitting: Internal Medicine

## 2014-09-27 VITALS — BP 120/76 | HR 95 | Temp 98.2°F | Ht 65.0 in | Wt 238.5 lb

## 2014-09-27 DIAGNOSIS — R0789 Other chest pain: Secondary | ICD-10-CM

## 2014-09-27 DIAGNOSIS — E785 Hyperlipidemia, unspecified: Secondary | ICD-10-CM

## 2014-09-27 DIAGNOSIS — E118 Type 2 diabetes mellitus with unspecified complications: Secondary | ICD-10-CM

## 2014-09-27 MED ORDER — INSULIN PEN NEEDLE 32G X 5 MM MISC: Status: DC

## 2014-09-27 MED ORDER — HYDROCODONE-ACETAMINOPHEN 5-325 MG PO TABS: 1.0000 | ORAL_TABLET | Freq: Four times a day (QID) | ORAL | Status: DC | PRN

## 2014-09-27 MED ORDER — LIRAGLUTIDE 18 MG/3ML ~~LOC~~ SOPN: 1.8000 mg | PEN_INJECTOR | Freq: Every day | SUBCUTANEOUS | Status: DC

## 2014-09-27 NOTE — Progress Notes (Signed)
Subjective:    Patient ID: Maureen Price, female    DOB: 1976-03-10, 39 y.o.   MRN: [id number]  Back Pain This is a recurrent problem. The current episode started 1 to 4 weeks ago. The problem occurs intermittently. The problem is unchanged. The pain is present in the thoracic spine (left chest wall area). The quality of the pain is described as aching. The pain does not radiate. The pain is at a severity of 1/10. The pain is mild. The pain is worse during the day. The symptoms are aggravated by twisting. Stiffness is present all day. Associated symptoms include chest pain. Pertinent negatives include no abdominal pain, bladder incontinence, bowel incontinence, dysuria, fever, headaches, leg pain, numbness, paresis, paresthesias, pelvic pain, perianal numbness, tingling, weakness or weight loss. She has tried analgesics for the symptoms. The treatment provided significant relief.      Review of Systems  Constitutional: Negative.  Negative for fever, chills, weight loss, diaphoresis, appetite change and fatigue.  HENT: Negative.   Eyes: Negative.   Respiratory: Negative.  Negative for apnea, cough, choking, chest tightness, shortness of breath, wheezing and stridor.   Cardiovascular: Positive for chest pain. Negative for palpitations and leg swelling.  Gastrointestinal: Negative.  Negative for nausea, vomiting, abdominal pain, diarrhea, constipation, blood in stool and bowel incontinence.  Endocrine: Negative.   Genitourinary: Negative.  Negative for bladder incontinence, dysuria and pelvic pain.  Musculoskeletal: Positive for back pain. Negative for myalgias, joint swelling, arthralgias, gait problem, neck pain and neck stiffness.  Skin: Negative.  Negative for rash.  Allergic/Immunologic: Negative.   Neurological: Negative.  Negative for tingling, weakness, numbness, headaches and paresthesias.  Hematological: Negative.  Negative for adenopathy. Does not bruise/bleed easily.    Psychiatric/Behavioral: Negative.        Objective:   Physical Exam  Constitutional: She is oriented to person, place, and time. She appears well-developed and well-nourished. No distress.  HENT:  Head: Normocephalic and atraumatic.  Mouth/Throat: Oropharynx is clear and moist. No oropharyngeal exudate.  Eyes: Conjunctivae are normal. Right eye exhibits no discharge. Left eye exhibits no discharge. No scleral icterus.  Neck: Normal range of motion. Neck supple. No JVD present. No tracheal deviation present. No thyromegaly present.  Cardiovascular: Normal rate, regular rhythm, normal heart sounds and intact distal pulses.  Exam reveals no gallop and no friction rub.   No murmur heard. Pulmonary/Chest: Effort normal and breath sounds normal. No accessory muscle usage or stridor. No respiratory distress. She has no decreased breath sounds. She has no wheezes. She has no rhonchi. She has no rales. She exhibits no tenderness.  Abdominal: Soft. Bowel sounds are normal. She exhibits no distension and no mass. There is no tenderness. There is no rebound and no guarding.  Musculoskeletal: Normal range of motion. She exhibits no edema or tenderness.       Cervical back: Normal.       Thoracic back: Normal. She exhibits normal range of motion, no tenderness, no bony tenderness, no swelling, no edema, no deformity, no laceration, no pain, no spasm and normal pulse.       Lumbar back: Normal.  Lymphadenopathy:    She has no cervical adenopathy.  Neurological: She is oriented to person, place, and time.  Skin: Skin is warm and dry. No rash noted. She is not diaphoretic. No erythema. No pallor.  Psychiatric: She has a normal mood and affect. Her behavior is normal. Judgment and thought content normal.  Vitals reviewed.    Lab  Results  Component Value Date   WBC 8.5 09/14/2014   HGB 12.9 09/14/2014   HCT 37.2 09/14/2014   PLT 268 09/14/2014   GLUCOSE 325* 09/19/2014   CHOL 126 02/23/2014    TRIG 156* 02/23/2014   HDL 19* 02/23/2014   LDLCALC 76 02/23/2014   ALT 20 02/22/2014   AST 15 02/22/2014   NA 131* 09/19/2014   K 3.6 09/19/2014   CL 100 09/19/2014   CREATININE 0.51 09/19/2014   BUN 7 09/19/2014   CO2 23 09/19/2014   TSH 1.55 08/25/2013   INR 1.09 03/23/2014   HGBA1C 10.4* 06/14/2014   MICROALBUR 1.1 07/07/2012       Assessment & Plan:

## 2014-09-27 NOTE — Patient Instructions (Signed)

## 2014-09-27 NOTE — Progress Notes (Signed)
Pre visit review using our clinic review tool, if applicable. No additional management support is needed unless otherwise documented below in the visit note. 

## 2014-10-01 NOTE — Assessment & Plan Note (Signed)
Her blood sugars have not been well controlled I will recheck her A1C and will monitor her renal function

## 2014-10-01 NOTE — Assessment & Plan Note (Signed)
This current episode of CP has been investigated in the ER and there is no indication that this is ischemia, the pain appears to be MS and she gets relief with norco. Will continue the same for now.

## 2014-10-01 NOTE — Assessment & Plan Note (Signed)
She is doing well on lipitor I will check her lipid panel to see if she has achieved her LDL goal

## 2014-10-03 ENCOUNTER — Telehealth: Payer: Self-pay

## 2014-10-03 NOTE — Telephone Encounter (Signed)
PA for Victoza has been sent via covermymeds.

## 2014-10-04 NOTE — Telephone Encounter (Signed)
Pharmacy contacted and pt contacted

## 2014-10-04 NOTE — Telephone Encounter (Signed)
PA approved.

## 2014-10-15 IMAGING — CR DG CHEST 2V
2 series · 2 of 2 positions shown · non-contrast
Comparison: 12/14/2013

CLINICAL DATA: Left upper chest pain

EXAM:
CHEST  2 VIEW

[w chest lat]
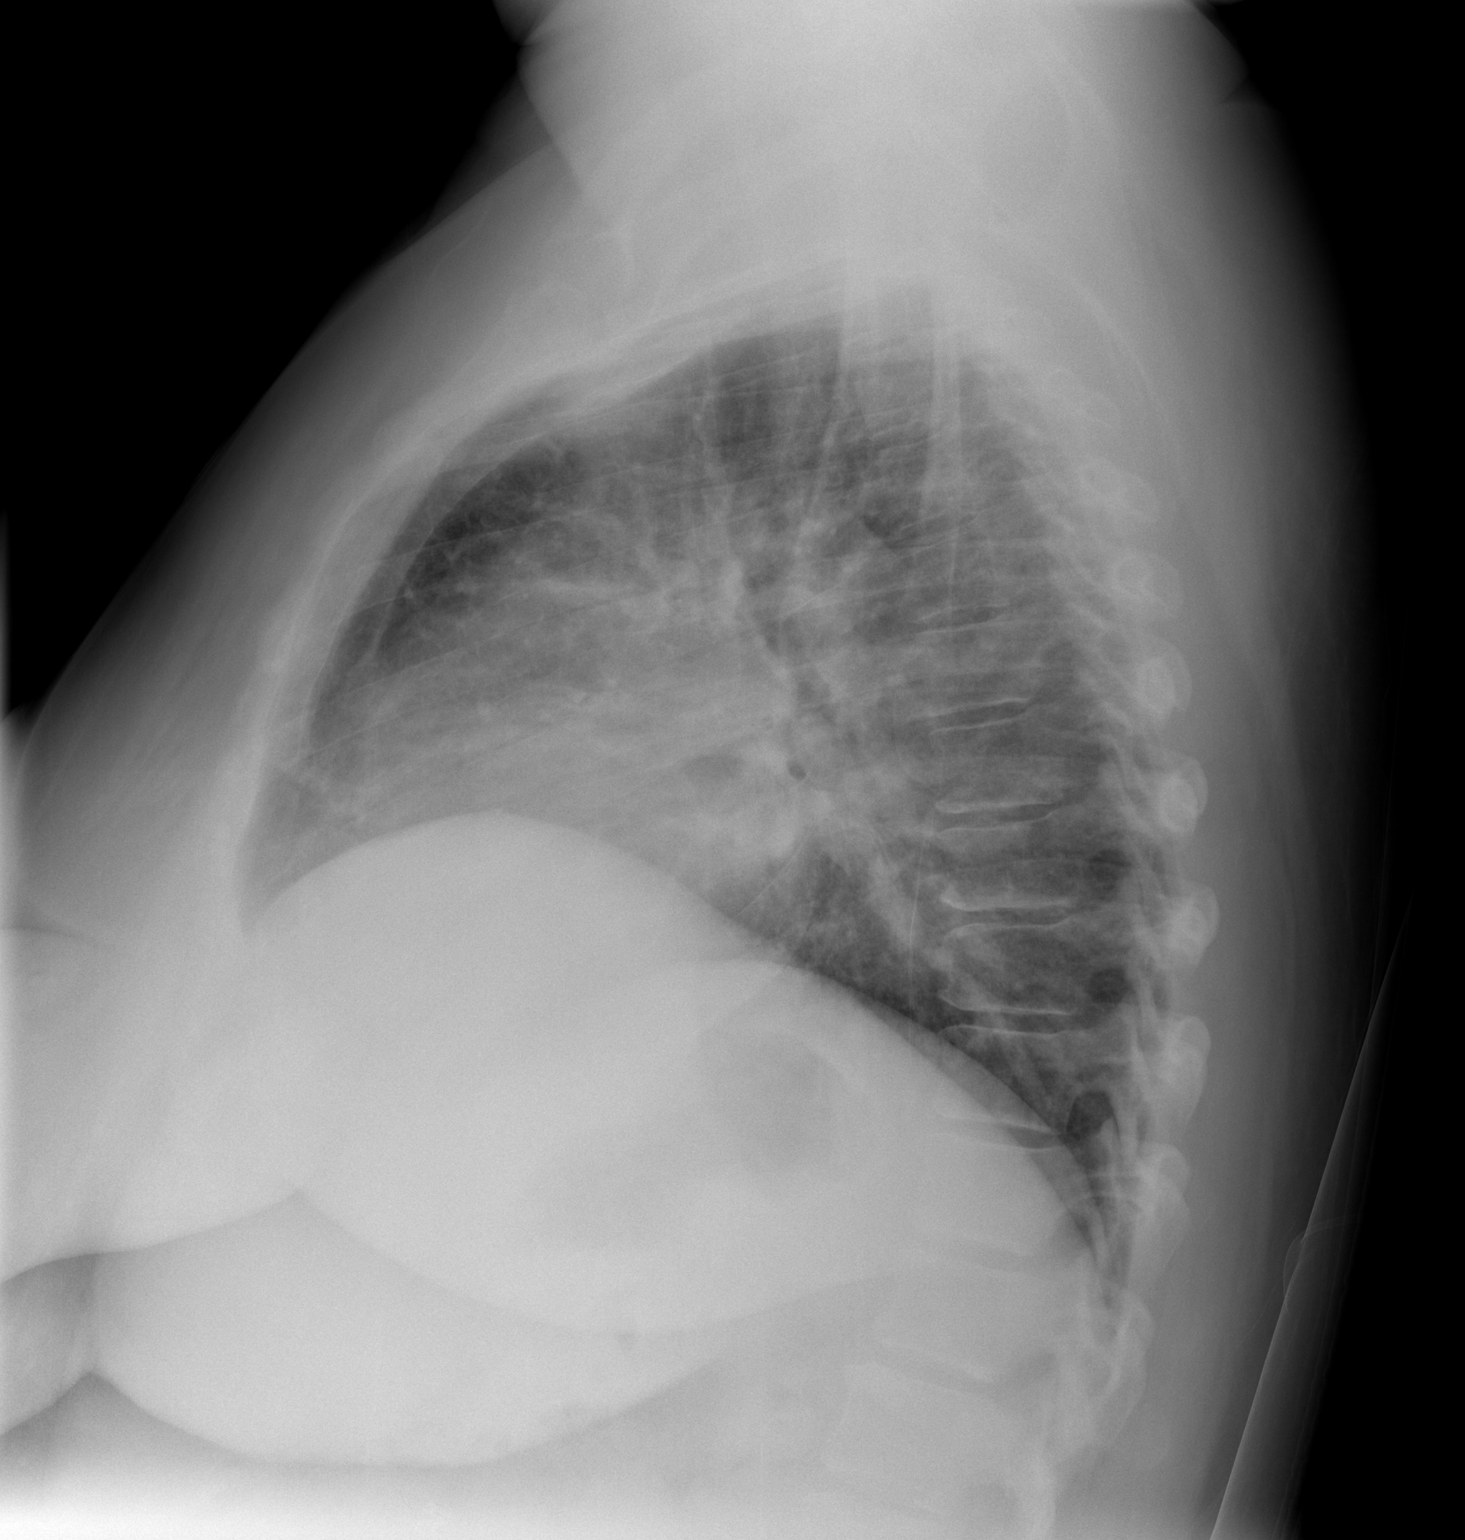

[w chest pa]
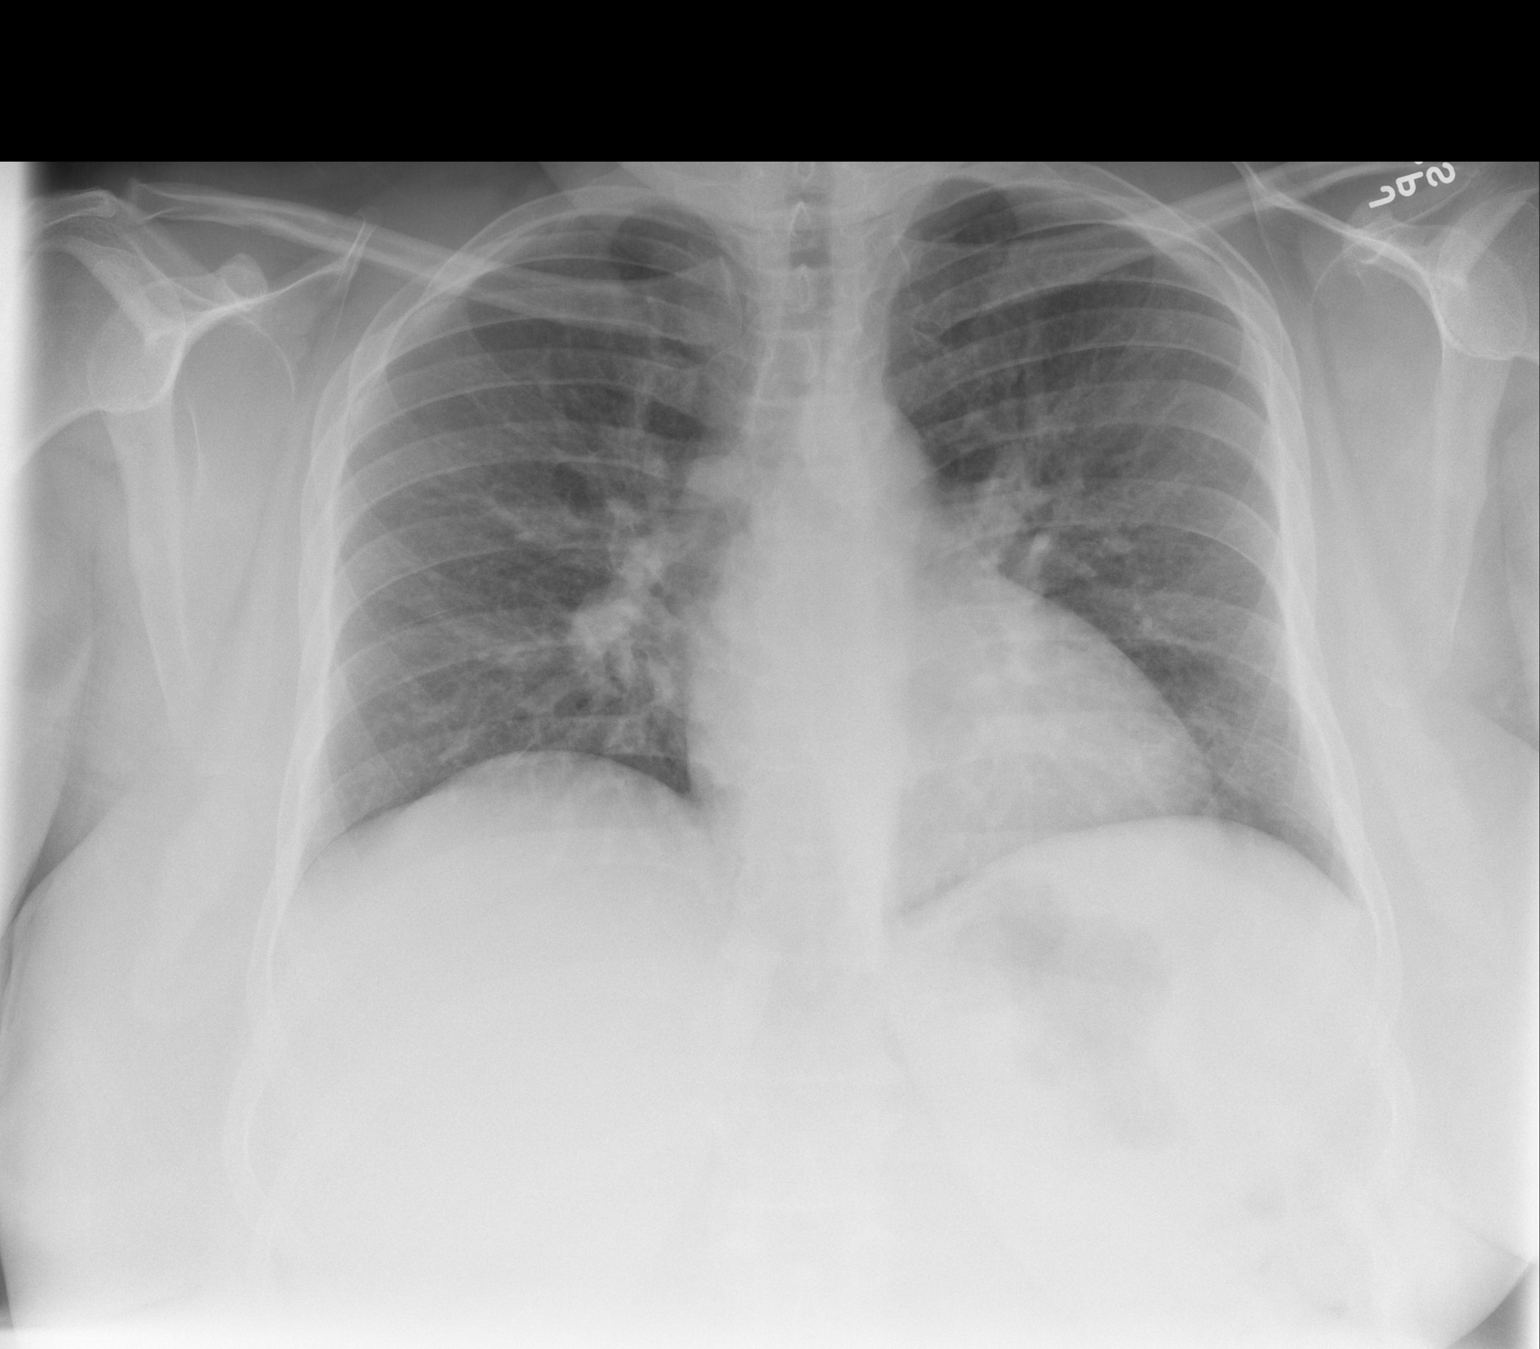

[2 of 2 positions shown; findings below may reference images not displayed]

FINDINGS: Lungs are clear.  No pleural effusion or pneumothorax.

The heart is normal in size.

Mild degenerative changes of the visualized thoracolumbar spine.
IMPRESSION: No evidence of acute cardiopulmonary disease.

## 2014-10-18 ENCOUNTER — Telehealth: Payer: Self-pay | Admitting: Internal Medicine

## 2014-10-18 DIAGNOSIS — R0789 Other chest pain: Secondary | ICD-10-CM

## 2014-10-18 NOTE — Telephone Encounter (Signed)
Pt is requesting referral to pain management. She states the strength of pain medication we are giving her is not enough.

## 2014-10-29 ENCOUNTER — Encounter (HOSPITAL_COMMUNITY): Payer: Self-pay | Admitting: Emergency Medicine

## 2014-10-29 ENCOUNTER — Emergency Department (HOSPITAL_COMMUNITY)
Admission: EM | Admit: 2014-10-29 | Discharge: 2014-10-29 | Disposition: A | Discharge status: Home / Self Care | Payer: 59 | Attending: Emergency Medicine | Admitting: Emergency Medicine

## 2014-10-29 ENCOUNTER — Emergency Department (HOSPITAL_COMMUNITY): Payer: 59

## 2014-10-29 DIAGNOSIS — Z794 Long term (current) use of insulin: Secondary | ICD-10-CM | POA: Diagnosis not present

## 2014-10-29 DIAGNOSIS — J159 Unspecified bacterial pneumonia: Secondary | ICD-10-CM | POA: Diagnosis not present

## 2014-10-29 DIAGNOSIS — J45909 Unspecified asthma, uncomplicated: Secondary | ICD-10-CM | POA: Insufficient documentation

## 2014-10-29 DIAGNOSIS — I252 Old myocardial infarction: Secondary | ICD-10-CM | POA: Insufficient documentation

## 2014-10-29 DIAGNOSIS — E669 Obesity, unspecified: Secondary | ICD-10-CM | POA: Insufficient documentation

## 2014-10-29 DIAGNOSIS — Z9861 Coronary angioplasty status: Secondary | ICD-10-CM | POA: Insufficient documentation

## 2014-10-29 DIAGNOSIS — F329 Major depressive disorder, single episode, unspecified: Secondary | ICD-10-CM | POA: Diagnosis not present

## 2014-10-29 DIAGNOSIS — M1990 Unspecified osteoarthritis, unspecified site: Secondary | ICD-10-CM | POA: Diagnosis not present

## 2014-10-29 DIAGNOSIS — F419 Anxiety disorder, unspecified: Secondary | ICD-10-CM | POA: Insufficient documentation

## 2014-10-29 DIAGNOSIS — Z8719 Personal history of other diseases of the digestive system: Secondary | ICD-10-CM | POA: Insufficient documentation

## 2014-10-29 DIAGNOSIS — R739 Hyperglycemia, unspecified: Secondary | ICD-10-CM

## 2014-10-29 DIAGNOSIS — E785 Hyperlipidemia, unspecified: Secondary | ICD-10-CM | POA: Insufficient documentation

## 2014-10-29 DIAGNOSIS — Z880 Allergy status to penicillin: Secondary | ICD-10-CM | POA: Insufficient documentation

## 2014-10-29 DIAGNOSIS — I2510 Atherosclerotic heart disease of native coronary artery without angina pectoris: Secondary | ICD-10-CM | POA: Insufficient documentation

## 2014-10-29 DIAGNOSIS — Z9889 Other specified postprocedural states: Secondary | ICD-10-CM | POA: Diagnosis not present

## 2014-10-29 DIAGNOSIS — Z3202 Encounter for pregnancy test, result negative: Secondary | ICD-10-CM | POA: Insufficient documentation

## 2014-10-29 DIAGNOSIS — R0602 Shortness of breath: Secondary | ICD-10-CM

## 2014-10-29 DIAGNOSIS — R079 Chest pain, unspecified: Secondary | ICD-10-CM | POA: Diagnosis present

## 2014-10-29 DIAGNOSIS — Z7982 Long term (current) use of aspirin: Secondary | ICD-10-CM | POA: Diagnosis not present

## 2014-10-29 DIAGNOSIS — E1165 Type 2 diabetes mellitus with hyperglycemia: Secondary | ICD-10-CM | POA: Insufficient documentation

## 2014-10-29 DIAGNOSIS — Z79899 Other long term (current) drug therapy: Secondary | ICD-10-CM | POA: Diagnosis not present

## 2014-10-29 DIAGNOSIS — J189 Pneumonia, unspecified organism: Secondary | ICD-10-CM

## 2014-10-29 DIAGNOSIS — R1011 Right upper quadrant pain: Secondary | ICD-10-CM

## 2014-10-29 LAB — BASIC METABOLIC PANEL
Anion gap: 7 (ref 5–15)
BUN: 5 mg/dL — ABNORMAL LOW (ref 6–23)
CO2: 23 mmol/L (ref 19–32)
Calcium: 9 mg/dL (ref 8.4–10.5)
Chloride: 101 mmol/L (ref 96–112)
Creatinine, Ser: 0.48 mg/dL — ABNORMAL LOW (ref 0.50–1.10)
GFR calc non Af Amer: >90 mL/min (ref >90)
Glucose, Bld: 396 mg/dL — ABNORMAL HIGH (ref 70–99)
Potassium: 3.6 mmol/L (ref 3.5–5.1)
Sodium: 131 mmol/L — ABNORMAL LOW (ref 135–145)

## 2014-10-29 LAB — URINALYSIS, ROUTINE W REFLEX MICROSCOPIC
Bilirubin Urine: NEGATIVE
Glucose, UA: >1000 mg/dL — AB
Hgb urine dipstick: NEGATIVE
Ketones, ur: NEGATIVE mg/dL
LEUKOCYTES UA: NEGATIVE
Nitrite: NEGATIVE
Protein, ur: NEGATIVE mg/dL
SPECIFIC GRAVITY, URINE: 1.027 (ref 1.005–1.030)
UROBILINOGEN UA: 0.2 mg/dL (ref 0.0–1.0)
pH: 5.5 (ref 5.0–8.0)

## 2014-10-29 LAB — CBG MONITORING, ED: Glucose-Capillary: 274 mg/dL — ABNORMAL HIGH (ref 70–99)

## 2014-10-29 LAB — CBC
HCT: 40.5 % (ref 36.0–46.0)
HEMOGLOBIN: 14.2 g/dL (ref 12.0–15.0)
MCH: 26.8 pg (ref 26.0–34.0)
MCHC: 35.1 g/dL (ref 30.0–36.0)
MCV: 76.6 fL — ABNORMAL LOW (ref 78.0–100.0)
PLATELETS: 237 10*3/uL (ref 150–400)
RBC: 5.29 MIL/uL — AB (ref 3.87–5.11)
RDW: 14.6 % (ref 11.5–15.5)
WBC: 5.4 10*3/uL (ref 4.0–10.5)

## 2014-10-29 LAB — D-DIMER, QUANTITATIVE: D-Dimer, Quant: 0.98 ug/mL-FEU — ABNORMAL HIGH (ref 0.00–0.48)

## 2014-10-29 LAB — RAPID HIV SCREEN (WH-MAU): SUDS RAPID HIV SCREEN: NONREACTIVE

## 2014-10-29 LAB — URINE MICROSCOPIC-ADD ON

## 2014-10-29 LAB — POC URINE PREG, ED: Preg Test, Ur: NEGATIVE

## 2014-10-29 LAB — HEPATIC FUNCTION PANEL
ALK PHOS: 108 U/L (ref 39–117)
ALT: 28 U/L (ref 0–35)
AST: 20 U/L (ref 0–37)
Albumin: 3.2 g/dL — ABNORMAL LOW (ref 3.5–5.2)
Total Bilirubin: 0.3 mg/dL (ref 0.3–1.2)
Total Protein: 6.9 g/dL (ref 6.0–8.3)

## 2014-10-29 LAB — I-STAT TROPONIN, ED: TROPONIN I, POC: 0 ng/mL (ref 0.00–0.08)

## 2014-10-29 LAB — PROTIME-INR
INR: 1.05 (ref 0.00–1.49)
Prothrombin Time: 13.9 seconds (ref 11.6–15.2)

## 2014-10-29 LAB — BRAIN NATRIURETIC PEPTIDE: B NATRIURETIC PEPTIDE 5: 3.8 pg/mL (ref 0.0–100.0)

## 2014-10-29 LAB — LIPASE, BLOOD: Lipase: 21 U/L (ref 11–59)

## 2014-10-29 MED ORDER — NITROGLYCERIN 0.4 MG SL SUBL
0.4000 mg | SUBLINGUAL_TABLET | SUBLINGUAL | Status: DC | PRN
  Administered 2014-10-29: 0.4 mg via SUBLINGUAL

## 2014-10-29 MED ORDER — HYDROMORPHONE HCL 1 MG/ML IJ SOLN
1.0000 mg | Freq: Once | INTRAMUSCULAR | Status: AC
  Administered 2014-10-29: 1 mg via INTRAVENOUS
  Filled 2014-10-29: qty 1

## 2014-10-29 MED ORDER — METOCLOPRAMIDE HCL 10 MG PO TABS: 10.0000 mg | ORAL_TABLET | Freq: Four times a day (QID) | ORAL | Status: DC | PRN

## 2014-10-29 MED ORDER — IOHEXOL 350 MG/ML SOLN
80.0000 mL | Freq: Once | INTRAVENOUS | Status: AC | PRN
  Administered 2014-10-29: 80 mL via INTRAVENOUS

## 2014-10-29 MED ORDER — SODIUM CHLORIDE 0.9 % IV BOLUS (SEPSIS)
2000.0000 mL | Freq: Once | INTRAVENOUS | Status: AC
  Administered 2014-10-29: 2000 mL via INTRAVENOUS

## 2014-10-29 MED ORDER — HYDROCODONE-ACETAMINOPHEN 5-325 MG PO TABS: 1.0000 | ORAL_TABLET | Freq: Four times a day (QID) | ORAL | Status: DC | PRN

## 2014-10-29 MED ORDER — LORAZEPAM 2 MG/ML IJ SOLN
1.0000 mg | Freq: Once | INTRAMUSCULAR | Status: AC
  Administered 2014-10-29: 1 mg via INTRAVENOUS
  Filled 2014-10-29: qty 1

## 2014-10-29 MED ORDER — ASPIRIN 325 MG PO TABS
325.0000 mg | ORAL_TABLET | ORAL | Status: AC
  Administered 2014-10-29: 325 mg via ORAL
  Filled 2014-10-29: qty 1

## 2014-10-29 MED ORDER — IPRATROPIUM-ALBUTEROL 0.5-2.5 (3) MG/3ML IN SOLN
3.0000 mL | Freq: Once | RESPIRATORY_TRACT | Status: AC
  Administered 2014-10-29: 3 mL via RESPIRATORY_TRACT
  Filled 2014-10-29: qty 3

## 2014-10-29 MED ORDER — LEVOFLOXACIN 750 MG PO TABS: 750.0000 mg | ORAL_TABLET | Freq: Every day | ORAL | Status: DC

## 2014-10-29 MED ORDER — MORPHINE SULFATE 4 MG/ML IJ SOLN
4.0000 mg | Freq: Once | INTRAMUSCULAR | Status: AC
  Administered 2014-10-29: 4 mg via INTRAVENOUS
  Filled 2014-10-29: qty 1

## 2014-10-29 MED ORDER — ONDANSETRON HCL 4 MG/2ML IJ SOLN
4.0000 mg | Freq: Once | INTRAMUSCULAR | Status: AC
  Administered 2014-10-29: 4 mg via INTRAVENOUS
  Filled 2014-10-29: qty 2

## 2014-10-29 MED ORDER — LEVOFLOXACIN IN D5W 750 MG/150ML IV SOLN
750.0000 mg | Freq: Once | INTRAVENOUS | Status: AC
Start: 2014-10-29 — End: 2014-10-29
  Administered 2014-10-29: 750 mg via INTRAVENOUS
  Filled 2014-10-29: qty 150

## 2014-10-29 NOTE — ED Notes (Signed)
After 1 nitro, patient moaning c/o of feeling dizzy.  MD made aware.

## 2014-10-29 NOTE — ED Notes (Signed)
Patient coming from home with sudden onset of left sided chest pain with radiation to the left arm.  Patient c/o of associated SOB, lightheaded, back pain and nausea.  Patient states the pain is worse when she coughs.  Patient describes the pain as a pressure.

## 2014-10-29 NOTE — ED Provider Notes (Addendum)
CSN: [id number]     Arrival date & time 10/29/14  0607 History   First MD Initiated Contact with Patient 10/29/14 [phone]     Chief Complaint  Patient presents with  . Chest Pain  . Back Pain     (Consider location/radiation/quality/duration/timing/severity/associated sxs/prior Treatment) The history is provided by the patient.  Maureen Price is a 39 y.o. female hx of DM, MI s/p stent, diabetes here presenting with abdominal pain, chest pain, nausea. She states that her family is sick with similar symptoms and for several days she's been having some nausea. She did vomit once several days ago. Also has been complaining of some nonproductive cough but no fever. She had some epigastric pain as well. Has some diffuse myalgias as well. Denies any urinary symptoms. She was seen in the ED several weeks ago for chest pain and was thought to have MSK pain.   Past Medical History  Diagnosis Date  . Diabetes mellitus     2002; no meds since 2004  . Acute myocardial infarction of other anterior wall, initial episode of care   . Paroxysmal ventricular tachycardia   . Obesity   . Dyslipidemia, goal LDL below 70 10/26/2013  . STEMI (ST elevation myocardial infarction)   . CAD (coronary artery disease)   . Arthritis   . Dysrhythmia   . Anginal pain   . Asthma   . Shortness of breath   . Depression   . Anxiety   . GERD (gastroesophageal reflux disease)   . Headache(784.0)   . Tobacco abuse    Past Surgical History  Procedure Laterality Date  . Cesarean section    . Gallbladder surgery    . Tubal ligation      2000  . Cesarean section  [id number]  . Coronary angioplasty with stent placement    . Left heart cath N/A 10/24/2013    Procedure: LEFT HEART CATH;  Surgeon: Lorretta Harp, MD;  [facility];  Service: Cardiovascular;  Laterality: N/A;  . Left heart catheterization with coronary angiogram N/A 10/26/2013    Procedure: LEFT HEART CATHETERIZATION WITH CORONARY  ANGIOGRAM;  Surgeon: Burnell Blanks, MD;  Location: [facility] CATH LAB;  Service: Cardiovascular;  Laterality: N/A;  . Left heart catheterization with coronary angiogram N/A 03/29/2014    Procedure: LEFT HEART CATHETERIZATION WITH CORONARY ANGIOGRAM;  Surgeon: Lorretta Harp, MD;  Location: [facility] CATH LAB;  Service: Cardiovascular;  Laterality: N/A;  . Cholecystectomy     Family History  Problem Relation Age of Onset  . Arthritis Mother   . Diabetes Mother   . Heart disease Father   . Stroke Father   . Cancer Neg Hx   . Alcohol abuse Neg Hx   . Early death Neg Hx   . Kidney disease Neg Hx   . Hypertension Neg Hx   . Hyperlipidemia Neg Hx   . Diabetes Maternal Grandfather    History  Substance Use Topics  . Smoking status: Current Every Day Smoker -- 0.50 packs/day for 20 years    Types: Cigarettes  . Smokeless tobacco: Never Used  . Alcohol Use: No     Comment: occasional   OB History    Gravida Para Term Preterm AB TAB SAB Ectopic Multiple Living   4 3 3  1  1   3      Review of Systems  Respiratory: Positive for cough.   Cardiovascular: Positive for chest pain.  Gastrointestinal: Positive for nausea.  Musculoskeletal:  Positive for back pain.  All other systems reviewed and are negative.     Allergies  Erythromycin; Penicillins; Metformin and related; and Viibryd  Home Medications   Prior to Admission medications   Medication Sig Start Date End Date Taking? Authorizing Provider  ALPRAZolam Duanne Moron) 0.5 MG tablet Take 1 tablet (0.5 mg total) by mouth at bedtime as needed for anxiety. 06/14/14  Yes [name], MD  aspirin EC 81 MG tablet Take 81 mg by mouth daily.   Yes Historical Provider, MD  atorvastatin (LIPITOR) 80 MG tablet Take 80 mg by mouth daily.   Yes Historical Provider, MD  Biotin 1000 MCG tablet Take 1,000 mcg by mouth 2 (two) times daily.   Yes Historical Provider, MD  Canagliflozin (INVOKANA) 300 MG TABS Take 1 tablet (300 mg total) by mouth daily.  06/14/14  Yes [name], MD  HYDROcodone-acetaminophen (NORCO/VICODIN) 5-325 MG per tablet Take 1 tablet by mouth every 6 (six) hours as needed for moderate pain or severe pain. 09/27/14  Yes [name], MD  Insulin Glargine (TOUJEO SOLOSTAR) 300 UNIT/ML SOPN Inject 50 Units into the skin daily. 06/14/14  Yes [name], MD  Insulin Pen Needle 32G X 5 MM MISC Use pen needle with victoza insulin injector as directed by your physician. 09/27/14  Yes [name], MD  Liraglutide (VICTOZA) 18 MG/3ML SOPN Inject 0.3 mLs (1.8 mg total) into the skin daily. 09/27/14  Yes [name], MD  Menthol, Topical Analgesic, (BIOFREEZE EX) Apply 1 application topically daily as needed (body pain).   Yes Historical Provider, MD  Multiple Vitamins-Minerals (MULTIVITAMIN PO) Take 1 tablet by mouth daily.   Yes Historical Provider, MD  nitroGLYCERIN (NITROSTAT) 0.4 MG SL tablet Place 0.4 mg under the tongue every 5 (five) minutes as needed for chest pain.   Yes Historical Provider, MD  ticagrelor (BRILINTA) 90 MG TABS tablet Take 1 tablet (90 mg total) by mouth 2 (two) times daily. 04/20/14  Yes Lorretta Harp, MD   BP 122/81 mmHg  Pulse 93  Temp(Src) 98.5 F (36.9 C) (Oral)  Resp 13  Ht 5' 4.5" (1.638 m)  Wt 230 lb (104.327 kg)  BMI 38.88 kg/m2  SpO2 97%  LMP 10/09/2014 Physical Exam  Constitutional: She is oriented to person, place, and time.  Dehydrated   HENT:  Head: Normocephalic.  MM dry   Eyes: Conjunctivae are normal. Pupils are equal, round, and reactive to light.  Neck: Normal range of motion. Neck supple.  Cardiovascular: Regular rhythm and normal heart sounds.   Mildly tachy   Pulmonary/Chest:  Mild diffuse wheezing, no crackles   Abdominal: Soft. Bowel sounds are normal.  Mild RUQ tenderness, no rebound or guarding.   Musculoskeletal: Normal range of motion. She exhibits no edema or tenderness.  Neurological: She is alert and oriented to person, place, and time. No cranial  nerve deficit. Coordination normal.  Skin: Skin is warm and dry.  Psychiatric: She has a normal mood and affect. Her behavior is normal. Judgment and thought content normal.  Nursing note and vitals reviewed.   ED Course  Procedures (including critical care time) Labs Review Labs Reviewed  CBC - Abnormal; Notable for the following:    RBC 5.29 (*)    MCV 76.6 (*)    All other components within normal limits  BASIC METABOLIC PANEL - Abnormal; Notable for the following:    Sodium 131 (*)    Glucose, Bld 396 (*)  BUN 5 (*)    Creatinine, Ser 0.48 (*)    All other components within normal limits  D-DIMER, QUANTITATIVE - Abnormal; Notable for the following:    D-Dimer, Quant 0.98 (*)    All other components within normal limits  HEPATIC FUNCTION PANEL - Abnormal; Notable for the following:    Albumin 3.2 (*)    All other components within normal limits  URINALYSIS, ROUTINE W REFLEX MICROSCOPIC - Abnormal; Notable for the following:    Glucose, UA >1000 (*)    All other components within normal limits  URINE MICROSCOPIC-ADD ON - Abnormal; Notable for the following:    Squamous Epithelial / LPF FEW (*)    All other components within normal limits  BRAIN NATRIURETIC PEPTIDE  PROTIME-INR  LIPASE, BLOOD  RAPID HIV SCREEN ([facility])  I-STAT TROPOININ, ED  POC URINE PREG, ED    Imaging Review Ct Angio Chest Pe W/cm &/or Wo Cm  10/29/2014   CLINICAL DATA:  39 year old female with sudden onset left chest pain radiating to the left upper extremity. Shortness of breath. Initial encounter.  EXAM: CT ANGIOGRAPHY CHEST WITH CONTRAST  TECHNIQUE: Multidetector CT imaging of the chest was performed using the standard protocol during bolus administration of intravenous contrast. Multiplanar CT image reconstructions and MIPs were obtained to evaluate the vascular anatomy.  CONTRAST:  42mL OMNIPAQUE IOHEXOL 350 MG/ML SOLN  COMPARISON:  Portable chest radiographs 0624 hours today and earlier. Chest  CTA 05/16/2012.  FINDINGS: Suboptimal contrast bolus timing in the pulmonary arterial tree. Large body habitus. No focal filling defect identified in the pulmonary arterial tree to suggest the presence of acute pulmonary embolism.  Lower lung volumes. Central airways are patent except for atelectatic changes. Lower lobe patchy mostly peribronchovascular ground-glass opacity is nonspecific, with a similar appearance in 2013. No consolidation. No other abnormal pulmonary opacity.  Cardiomegaly. No pericardial or pleural effusion. Negative visualized aorta. Calcified coronary artery atherosclerosis and probable left Coronary artery stent (series 4, image 32). Chronic mediastinal and hilar lymph node prominence, increased since 2013. No axillary lymphadenopathy.  Thyromegaly, otherwise negative thoracic inlet.  Stable and negative visualized upper abdominal viscera.  No acute osseous abnormality identified.  Review of the MIP images confirms the above findings.  IMPRESSION: 1. Suboptimal contrast bolus but no evidence of acute pulmonary embolus. 2. Large body habitus with somewhat low lung volumes. Nonspecific bilateral lower lobe peribronchovascular ground-glass opacity. This is similar tube increased compared to the 2013 CTA. This might simply reflect atelectasis. Viral/atypical lower lobe infection versus mosaic lung changes (i.e. Small vessel versus small airway disease) are the main differential considerations. 3. Nonspecific mild mediastinal and hilar lymphadenopathy. If not reactive due to infection or volume overload, sarcoidosis or less likely other lymphoproliferative process could be considered. 4. Cardiomegaly with Coronary artery disease. No pericardial or pleural effusion.   Electronically Signed   By: Genevie Ann M.D.   On: 10/29/2014 09:27   US Abdomen Complete  10/29/2014   CLINICAL DATA:  RIGHT upper quadrant pain, pain all over body for 1 day, remote cholecystectomy history diabetes, coronary artery  disease post MI, GERD, smoking  EXAM: ULTRASOUND ABDOMEN COMPLETE  COMPARISON:  03/29/2012  FINDINGS: Gallbladder: Surgically absent  Common bile duct: Diameter: 6 mm diameter, normal  Liver: Suboptimally visualized due to bowel gas and body habitus. Mildly echogenic, question mild fatty infiltration though this can be seen with cirrhosis and certain infiltrative disorders. Hepatopetal portal venous flow.  IVC: Normal appearance  Pancreas: Obscured by bowel  gas  Spleen: Not visualized, question related to bowel gas versus high subcostal position for body habitus.  Right Kidney: Length: 13.5 cm. Normal morphology without mass or hydronephrosis.  Left Kidney: None visualized question related to body habitus and bowel gas  Abdominal aorta: Proximal and mid abdominal aorta normal caliber, remainder obscured by bowel gas  Other findings: No free-fluid  IMPRESSION: Severely limited exam.  Post cholecystectomy.  Question minimal fatty infiltration of liver.  Inadequate visualization of liver, pancreas, spleen and LEFT kidney.  Recommend further assessment by CT imaging with IV and oral contrast adequately assess the abdominal cavity.   Electronically Signed   By: Lavonia Dana M.D.   On: 10/29/2014 09:35   Dg Chest Port 1 View  10/29/2014   CLINICAL DATA:  Chest pain beginning this morning.  EXAM: PORTABLE CHEST - 1 VIEW  COMPARISON:  PA and lateral chest 09/14/2014 and 07/30/2014.  FINDINGS: The lungs are clear. Heart size is normal. There is no pneumothorax or pleural effusion. No focal bony abnormality.  IMPRESSION: Negative chest.   Electronically Signed   By: Inge Rise M.D.   On: 10/29/2014 07:01     EKG Interpretation   Date/Time:  Monday October 29 2014 06:16:49 EST Ventricular Rate:  100 PR Interval:  177 QRS Duration: 86 QT Interval:  316 QTC Calculation: 407 R Axis:   1 Text Interpretation:  Sinus tachycardia Consider anterior infarct  Borderline T abnormalities, inferior leads more tachy  than previous  Confirmed by YAO  MD, DAVID ([id number]) on 10/29/2014 6:56:27 AM      MDM   Final diagnoses:  RUQ pain  Shortness of breath    Maureen Price is a 39 y.o. female here with nausea, epigastric pain, chest pain. Likely gastro. Consider PE vs ACS vs retained gallbladder stone (given RUQ tenderness) vs pancreatitis. Will get d-dimer, trop x 1 (symptoms for several days), LFTs, lipase. Will hydrate and reassess.   11:27 AM RUQ US unremarkable. D-dimer elevated. CT angio showed possible pneumonia vs atelectasis with ground glass opacity. No central PE but my suspicion is low. Not hypoxic and nl WBC. HIV neg. Glucose 396 but nl AG. Patient hydrated, given IVF and CBG improved. I doubt DKA. Hyperglycemia likely from pneumonia vs gastro. Tolerated fluids in the ED. Will dc with reglan, norco, levaquin.     Wandra Arthurs, MD  10/29/14 [place] Yao, MD 10/29/14 [phone]

## 2014-10-29 NOTE — ED Notes (Signed)
Pt resting, visitor at bedside

## 2014-10-29 NOTE — ED Notes (Signed)
Pt placed in a gown and hooked up to the monitor with 5 lead, BP cuff and pulse ox 

## 2014-10-29 NOTE — Discharge Instructions (Signed)
Take levaquin for a week.   Take norco for pain. Do NOT drive with it.   Take reglan for nausea.   Follow up with your doctor.  Return to ER if you have fever, vomiting, severe pain.

## 2014-10-29 NOTE — ED Notes (Signed)
Pt resting, talking to visitor at bedside; no needs at bedside

## 2014-10-29 NOTE — ED Notes (Signed)
Patient transported to CT 

## 2014-10-29 NOTE — ED Notes (Signed)
Pt still out of the department for testing 

## 2014-10-29 NOTE — ED Notes (Signed)
CBG 274 

## 2014-10-29 NOTE — ED Notes (Signed)
Pt has returned from Ultrasound and is now being transported to CT

## 2014-10-29 NOTE — ED Notes (Signed)
Pt d/c'd from monitor, continuous pulse oximetry and blood pressure cuff; pt getting dressed to be discharged home

## 2014-11-06 ENCOUNTER — Other Ambulatory Visit: Payer: Self-pay | Admitting: Cardiovascular Disease

## 2014-11-06 NOTE — Telephone Encounter (Signed)
Pt called in wanting some samples of Brilinta if they were available. Please f/u  thanks

## 2014-11-07 MED ORDER — TICAGRELOR 90 MG PO TABS: 90.0000 mg | ORAL_TABLET | Freq: Two times a day (BID) | ORAL | Status: DC

## 2014-11-07 NOTE — Telephone Encounter (Signed)
Patient aware samples are at the front desk for pick up  

## 2014-12-14 ENCOUNTER — Other Ambulatory Visit: Payer: Self-pay | Admitting: Internal Medicine

## 2015-01-02 ENCOUNTER — Ambulatory Visit (INDEPENDENT_AMBULATORY_CARE_PROVIDER_SITE_OTHER): Payer: 59 | Admitting: Internal Medicine

## 2015-01-02 ENCOUNTER — Encounter: Payer: Self-pay | Admitting: Internal Medicine

## 2015-01-02 ENCOUNTER — Other Ambulatory Visit (INDEPENDENT_AMBULATORY_CARE_PROVIDER_SITE_OTHER): Payer: 59

## 2015-01-02 VITALS — BP 124/78 | HR 95 | Temp 98.2°F | Resp 16 | Ht 64.5 in | Wt 237.0 lb

## 2015-01-02 DIAGNOSIS — E118 Type 2 diabetes mellitus with unspecified complications: Secondary | ICD-10-CM

## 2015-01-02 DIAGNOSIS — M5136 Other intervertebral disc degeneration, lumbar region: Secondary | ICD-10-CM | POA: Diagnosis not present

## 2015-01-02 DIAGNOSIS — E785 Hyperlipidemia, unspecified: Secondary | ICD-10-CM | POA: Diagnosis not present

## 2015-01-02 DIAGNOSIS — F4322 Adjustment disorder with anxiety: Secondary | ICD-10-CM | POA: Diagnosis not present

## 2015-01-02 DIAGNOSIS — M51369 Other intervertebral disc degeneration, lumbar region without mention of lumbar back pain or lower extremity pain: Secondary | ICD-10-CM

## 2015-01-02 LAB — URINALYSIS, ROUTINE W REFLEX MICROSCOPIC
BILIRUBIN URINE: NEGATIVE
Ketones, ur: NEGATIVE
NITRITE: NEGATIVE
Specific Gravity, Urine: 1.015 (ref 1.000–1.030)
UROBILINOGEN UA: 0.2 (ref 0.0–1.0)
Urine Glucose: NEGATIVE
pH: 6 (ref 5.0–8.0)

## 2015-01-02 LAB — CBC WITH DIFFERENTIAL/PLATELET
BASOS ABS: 0.1 10*3/uL (ref 0.0–0.1)
Basophils Relative: 1.6 % (ref 0.0–3.0)
Eosinophils Absolute: 0.3 10*3/uL (ref 0.0–0.7)
Eosinophils Relative: 3.5 % (ref 0.0–5.0)
HEMATOCRIT: 38.1 % (ref 36.0–46.0)
Hemoglobin: 13 g/dL (ref 12.0–15.0)
Lymphocytes Relative: 29.1 % (ref 12.0–46.0)
Lymphs Abs: 2.6 10*3/uL (ref 0.7–4.0)
MCHC: 34.1 g/dL (ref 30.0–36.0)
MCV: 80.3 fl (ref 78.0–100.0)
Monocytes Absolute: 0.5 10*3/uL (ref 0.1–1.0)
Monocytes Relative: 5.4 % (ref 3.0–12.0)
NEUTROS ABS: 5.3 10*3/uL (ref 1.4–7.7)
Neutrophils Relative %: 60.4 % (ref 43.0–77.0)
PLATELETS: 299 10*3/uL (ref 150.0–400.0)
RBC: 4.75 Mil/uL (ref 3.87–5.11)
RDW: 14.6 % (ref 11.5–15.5)
WBC: 8.8 10*3/uL (ref 4.0–10.5)

## 2015-01-02 LAB — LIPID PANEL
Cholesterol: 152 mg/dL (ref 0–200)
HDL: 24.3 mg/dL — ABNORMAL LOW (ref >39.00)
LDL CALC: 101 mg/dL — AB (ref 0–99)
NonHDL: 127.7
TRIGLYCERIDES: 134 mg/dL (ref 0.0–149.0)
Total CHOL/HDL Ratio: 6
VLDL: 26.8 mg/dL (ref 0.0–40.0)

## 2015-01-02 LAB — MICROALBUMIN / CREATININE URINE RATIO
CREATININE, U: 107.8 mg/dL
MICROALB/CREAT RATIO: 5.2 mg/g (ref 0.0–30.0)
Microalb, Ur: 5.6 mg/dL — ABNORMAL HIGH (ref 0.0–1.9)

## 2015-01-02 LAB — BASIC METABOLIC PANEL
BUN: 12 mg/dL (ref 6–23)
CALCIUM: 9 mg/dL (ref 8.4–10.5)
CO2: 28 mEq/L (ref 19–32)
CREATININE: 0.48 mg/dL (ref 0.40–1.20)
Chloride: 105 mEq/L (ref 96–112)
GFR: 185.51 mL/min (ref >60.00)
GLUCOSE: 145 mg/dL — AB (ref 70–99)
Potassium: 3.4 mEq/L — ABNORMAL LOW (ref 3.5–5.1)
Sodium: 138 mEq/L (ref 135–145)

## 2015-01-02 LAB — HEMOGLOBIN A1C: Hgb A1c MFr Bld: 10.1 % — ABNORMAL HIGH (ref 4.6–6.5)

## 2015-01-02 LAB — TSH: TSH: 1.21 u[IU]/mL (ref 0.35–4.50)

## 2015-01-02 MED ORDER — HYDROCODONE-ACETAMINOPHEN 10-325 MG PO TABS: 1.0000 | ORAL_TABLET | Freq: Three times a day (TID) | ORAL | Status: DC | PRN

## 2015-01-02 MED ORDER — LIRAGLUTIDE 18 MG/3ML ~~LOC~~ SOPN: 1.8000 mg | PEN_INJECTOR | Freq: Every day | SUBCUTANEOUS | Status: DC

## 2015-01-02 MED ORDER — INSULIN PEN NEEDLE 32G X 5 MM MISC: Status: DC

## 2015-01-02 MED ORDER — ALPRAZOLAM 0.5 MG PO TABS: ORAL_TABLET | ORAL | Status: DC

## 2015-01-02 NOTE — Progress Notes (Signed)
Subjective:    Patient ID: Maureen Price, female    DOB: 02-17-1976, 39 y.o.   MRN: [id number]  Diabetes She presents for her follow-up diabetic visit. She has type 2 diabetes mellitus. Her disease course has been fluctuating. Hypoglycemia symptoms include nervousness/anxiousness. Pertinent negatives for hypoglycemia include no confusion or dizziness. Associated symptoms include polydipsia. Pertinent negatives for diabetes include no blurred vision, no chest pain, no fatigue, no foot paresthesias, no foot ulcerations, no polyphagia, no polyuria, no visual change, no weakness and no weight loss. There are no hypoglycemic complications. Diabetic complications include heart disease. Current diabetic treatment includes oral agent (dual therapy) and insulin injections. She is compliant with treatment none of the time (she has not taken any meds for DM in 2 weeks). Her weight is stable. She is following a generally unhealthy diet. She never participates in exercise. There is no change in her home blood glucose trend. Her breakfast blood glucose range is generally >200 mg/dl. Her lunch blood glucose range is generally >200 mg/dl. Her dinner blood glucose range is generally >200 mg/dl. Her highest blood glucose is >200 mg/dl. Her overall blood glucose range is >200 mg/dl.      Review of Systems  Constitutional: Negative.  Negative for fever, chills, weight loss, diaphoresis, appetite change and fatigue.  HENT: Negative.   Eyes: Negative.  Negative for blurred vision.  Respiratory: Negative.  Negative for cough, choking, chest tightness, shortness of breath and stridor.   Cardiovascular: Negative.  Negative for chest pain, palpitations and leg swelling.  Gastrointestinal: Negative.  Negative for nausea, vomiting, abdominal pain, diarrhea, constipation and blood in stool.  Endocrine: Positive for polydipsia. Negative for polyphagia and polyuria.  Genitourinary: Negative.  Negative for dysuria,  urgency, frequency, decreased urine volume, enuresis and difficulty urinating.  Musculoskeletal: Positive for back pain. Negative for myalgias, joint swelling, arthralgias, gait problem, neck pain and neck stiffness.  Skin: Negative.  Negative for rash.  Allergic/Immunologic: Negative.   Neurological: Negative.  Negative for dizziness, weakness and light-headedness.  Hematological: Negative.  Negative for adenopathy. Does not bruise/bleed easily.  Psychiatric/Behavioral: Positive for sleep disturbance. Negative for suicidal ideas, hallucinations, behavioral problems, confusion, self-injury, dysphoric mood, decreased concentration and agitation. The patient is nervous/anxious. The patient is not hyperactive.        Objective:   Physical Exam  Constitutional: She appears well-developed and well-nourished. No distress.  HENT:  Head: Normocephalic and atraumatic.  Mouth/Throat: Oropharynx is clear and moist. No oropharyngeal exudate.  Eyes: Conjunctivae are normal. Right eye exhibits no discharge. Left eye exhibits no discharge. No scleral icterus.  Neck: Normal range of motion. Neck supple. No JVD present. No tracheal deviation present. No thyromegaly present.  Cardiovascular: Normal rate, regular rhythm, normal heart sounds and intact distal pulses.  Exam reveals no gallop and no friction rub.   No murmur heard. Pulmonary/Chest: Effort normal and breath sounds normal. No stridor. No respiratory distress. She has no wheezes. She has no rales. She exhibits no tenderness.  Abdominal: Soft. Bowel sounds are normal. She exhibits no distension and no mass. There is no tenderness. There is no rebound and no guarding.  Musculoskeletal:       Lumbar back: Normal. She exhibits normal range of motion, no tenderness, no bony tenderness, no swelling, no edema, no deformity, no laceration, no pain and no spasm.  Lymphadenopathy:    She has no cervical adenopathy.  Neurological: She is alert. She has normal  strength. She displays no atrophy, no tremor and  normal reflexes. No cranial nerve deficit or sensory deficit. She exhibits normal muscle tone. She displays a negative Romberg sign. She displays no seizure activity. Coordination and gait normal.  Reflex Scores:      Tricep reflexes are 1+ on the right side and 1+ on the left side.      Bicep reflexes are 1+ on the right side and 1+ on the left side.      Brachioradialis reflexes are 1+ on the right side and 1+ on the left side.      Patellar reflexes are 1+ on the right side and 1+ on the left side.      Achilles reflexes are 1+ on the right side and 1+ on the left side. Neg SLR in BLE  Skin: She is not diaphoretic.  Vitals reviewed.    Lab Results  Component Value Date   WBC 5.4 10/29/2014   HGB 14.2 10/29/2014   HCT 40.5 10/29/2014   PLT 237 10/29/2014   GLUCOSE 396* 10/29/2014   CHOL 126 02/23/2014   TRIG 156* 02/23/2014   HDL 19* 02/23/2014   LDLCALC 76 02/23/2014   ALT 28 10/29/2014   AST 20 10/29/2014   NA 131* 10/29/2014   K 3.6 10/29/2014   CL 101 10/29/2014   CREATININE 0.48* 10/29/2014   BUN 5* 10/29/2014   CO2 23 10/29/2014   TSH 1.55 08/25/2013   INR 1.05 10/29/2014   HGBA1C 10.4* 06/14/2014   MICROALBUR 1.1 07/07/2012       Assessment & Plan:

## 2015-01-02 NOTE — Progress Notes (Signed)
Pre visit review using our clinic review tool, if applicable. No additional management support is needed unless otherwise documented below in the visit note. 

## 2015-01-02 NOTE — Patient Instructions (Signed)

## 2015-01-03 LAB — DRUGS OF ABUSE SCREEN W/O ALC, ROUTINE URINE
Amphetamine Screen, Ur: NEGATIVE
Barbiturate Quant, Ur: NEGATIVE
Benzodiazepines.: NEGATIVE
COCAINE METABOLITES: NEGATIVE
CREATININE, U: 102.7 mg/dL
MARIJUANA METABOLITE: NEGATIVE
Methadone: NEGATIVE
Opiate Screen, Urine: NEGATIVE
PHENCYCLIDINE (PCP): NEGATIVE
Propoxyphene: NEGATIVE

## 2015-01-03 MED ORDER — POTASSIUM CHLORIDE CRYS ER 20 MEQ PO TBCR: 20.0000 meq | EXTENDED_RELEASE_TABLET | Freq: Every day | ORAL | Status: DC

## 2015-01-03 MED ORDER — CANAGLIFLOZIN 300 MG PO TABS: 300.0000 mg | ORAL_TABLET | Freq: Every day | ORAL | Status: DC

## 2015-01-03 MED ORDER — ATORVASTATIN CALCIUM 80 MG PO TABS: 80.0000 mg | ORAL_TABLET | Freq: Every day | ORAL | Status: DC

## 2015-01-03 NOTE — Assessment & Plan Note (Signed)
She has not quite achieved her LDL goal Will restart lipitor

## 2015-01-03 NOTE — Assessment & Plan Note (Signed)
She gets relief from the back pain with norco - will continue this for now Will check her UDS today to screen for substance abuse

## 2015-01-03 NOTE — Assessment & Plan Note (Addendum)
Her blood sugars are not well controlled She is not compliant with any of her meds She agrees to restart her prior meds Will refer to ENDO and for diabetic education She was referred for an eye exam

## 2015-01-03 NOTE — Assessment & Plan Note (Signed)
She is not willing to take an antidepressant Will cont xanax as needed

## 2015-01-16 ENCOUNTER — Encounter (HOSPITAL_COMMUNITY): Payer: Self-pay | Admitting: Emergency Medicine

## 2015-01-16 ENCOUNTER — Emergency Department (HOSPITAL_COMMUNITY): Payer: 59

## 2015-01-16 ENCOUNTER — Emergency Department (HOSPITAL_COMMUNITY)
Admission: EM | Admit: 2015-01-16 | Discharge: 2015-01-16 | Disposition: A | Discharge status: Home / Self Care | Payer: 59 | Attending: Emergency Medicine | Admitting: Emergency Medicine

## 2015-01-16 DIAGNOSIS — R0789 Other chest pain: Secondary | ICD-10-CM | POA: Insufficient documentation

## 2015-01-16 DIAGNOSIS — Z9889 Other specified postprocedural states: Secondary | ICD-10-CM | POA: Diagnosis not present

## 2015-01-16 DIAGNOSIS — E785 Hyperlipidemia, unspecified: Secondary | ICD-10-CM | POA: Diagnosis not present

## 2015-01-16 DIAGNOSIS — I252 Old myocardial infarction: Secondary | ICD-10-CM | POA: Insufficient documentation

## 2015-01-16 DIAGNOSIS — Z794 Long term (current) use of insulin: Secondary | ICD-10-CM | POA: Insufficient documentation

## 2015-01-16 DIAGNOSIS — E119 Type 2 diabetes mellitus without complications: Secondary | ICD-10-CM | POA: Diagnosis not present

## 2015-01-16 DIAGNOSIS — R071 Chest pain on breathing: Secondary | ICD-10-CM

## 2015-01-16 DIAGNOSIS — Z79899 Other long term (current) drug therapy: Secondary | ICD-10-CM | POA: Diagnosis not present

## 2015-01-16 DIAGNOSIS — R079 Chest pain, unspecified: Secondary | ICD-10-CM

## 2015-01-16 DIAGNOSIS — Z9861 Coronary angioplasty status: Secondary | ICD-10-CM | POA: Diagnosis not present

## 2015-01-16 DIAGNOSIS — R110 Nausea: Secondary | ICD-10-CM | POA: Diagnosis not present

## 2015-01-16 DIAGNOSIS — Z720 Tobacco use: Secondary | ICD-10-CM | POA: Diagnosis not present

## 2015-01-16 DIAGNOSIS — M1990 Unspecified osteoarthritis, unspecified site: Secondary | ICD-10-CM | POA: Insufficient documentation

## 2015-01-16 DIAGNOSIS — Z7982 Long term (current) use of aspirin: Secondary | ICD-10-CM | POA: Insufficient documentation

## 2015-01-16 DIAGNOSIS — J45901 Unspecified asthma with (acute) exacerbation: Secondary | ICD-10-CM | POA: Insufficient documentation

## 2015-01-16 DIAGNOSIS — I2510 Atherosclerotic heart disease of native coronary artery without angina pectoris: Secondary | ICD-10-CM | POA: Diagnosis not present

## 2015-01-16 DIAGNOSIS — Z880 Allergy status to penicillin: Secondary | ICD-10-CM | POA: Diagnosis not present

## 2015-01-16 DIAGNOSIS — Z8719 Personal history of other diseases of the digestive system: Secondary | ICD-10-CM | POA: Diagnosis not present

## 2015-01-16 DIAGNOSIS — R42 Dizziness and giddiness: Secondary | ICD-10-CM | POA: Insufficient documentation

## 2015-01-16 DIAGNOSIS — E669 Obesity, unspecified: Secondary | ICD-10-CM | POA: Diagnosis not present

## 2015-01-16 HISTORY — DX: Heart failure, unspecified: I50.9

## 2015-01-16 LAB — CBC
HCT: 37.5 % (ref 36.0–46.0)
HEMOGLOBIN: 12.6 g/dL (ref 12.0–15.0)
MCH: 26.3 pg (ref 26.0–34.0)
MCHC: 33.6 g/dL (ref 30.0–36.0)
MCV: 78.1 fL (ref 78.0–100.0)
Platelets: 278 10*3/uL (ref 150–400)
RBC: 4.8 MIL/uL (ref 3.87–5.11)
RDW: 13.7 % (ref 11.5–15.5)
WBC: 9.1 10*3/uL (ref 4.0–10.5)

## 2015-01-16 LAB — I-STAT BETA HCG BLOOD, ED (MC, WL, AP ONLY): I-stat hCG, quantitative: <5 m[IU]/mL (ref <5)

## 2015-01-16 LAB — BASIC METABOLIC PANEL
Anion gap: 9 (ref 5–15)
CALCIUM: 8.6 mg/dL — AB (ref 8.9–10.3)
CO2: 22 mmol/L (ref 22–32)
Chloride: 106 mmol/L (ref 101–111)
Creatinine, Ser: 0.43 mg/dL — ABNORMAL LOW (ref 0.44–1.00)
GFR calc Af Amer: >60 mL/min (ref >60)
GLUCOSE: 232 mg/dL — AB (ref 70–99)
Potassium: 3.5 mmol/L (ref 3.5–5.1)
SODIUM: 137 mmol/L (ref 135–145)

## 2015-01-16 LAB — I-STAT TROPONIN, ED
TROPONIN I, POC: 0 ng/mL (ref 0.00–0.08)
Troponin i, poc: 0.01 ng/mL (ref 0.00–0.08)

## 2015-01-16 LAB — PROTIME-INR
INR: 1.04 (ref 0.00–1.49)
PROTHROMBIN TIME: 13.7 s (ref 11.6–15.2)

## 2015-01-16 LAB — BRAIN NATRIURETIC PEPTIDE: B Natriuretic Peptide: 12.7 pg/mL (ref 0.0–100.0)

## 2015-01-16 LAB — D-DIMER, QUANTITATIVE (NOT AT ARMC): D DIMER QUANT: 0.69 ug{FEU}/mL — AB (ref 0.00–0.48)

## 2015-01-16 MED ORDER — IOHEXOL 350 MG/ML SOLN
100.0000 mL | Freq: Once | INTRAVENOUS | Status: AC | PRN
  Administered 2015-01-16: 100 mL via INTRAVENOUS

## 2015-01-16 MED ORDER — MORPHINE SULFATE 4 MG/ML IJ SOLN
4.0000 mg | Freq: Once | INTRAMUSCULAR | Status: AC
  Administered 2015-01-16: 4 mg via INTRAVENOUS
  Filled 2015-01-16: qty 1

## 2015-01-16 MED ORDER — ASPIRIN 325 MG PO TABS
325.0000 mg | ORAL_TABLET | ORAL | Status: AC
  Administered 2015-01-16: 325 mg via ORAL
  Filled 2015-01-16: qty 1

## 2015-01-16 MED ORDER — ONDANSETRON HCL 4 MG/2ML IJ SOLN
4.0000 mg | Freq: Once | INTRAMUSCULAR | Status: AC
  Administered 2015-01-16: 4 mg via INTRAVENOUS
  Filled 2015-01-16: qty 2

## 2015-01-16 MED ORDER — TRAMADOL HCL 50 MG PO TABS: 50.0000 mg | ORAL_TABLET | Freq: Four times a day (QID) | ORAL | Status: DC | PRN

## 2015-01-16 MED ORDER — NITROGLYCERIN 0.4 MG SL SUBL
0.4000 mg | SUBLINGUAL_TABLET | SUBLINGUAL | Status: DC | PRN
  Administered 2015-01-16 (×3): 0.4 mg via SUBLINGUAL

## 2015-01-16 MED ORDER — IBUPROFEN 800 MG PO TABS: 800.0000 mg | ORAL_TABLET | Freq: Three times a day (TID) | ORAL | Status: DC | PRN

## 2015-01-16 NOTE — Discharge Instructions (Signed)
Your blood work today was normal including 2 sets of cardiac labs. Your chest CT showed no sign of pneumonia, fluid on your lungs, collapsed lung or blood clot in your lungs. You did have a slightly enlarged spleen and enlarged lymph nodes in your chest. I recommended she follow-up with your primary care physician very closely for this. Please make an appointment.   Chest Wall Pain Chest wall pain is pain in or around the bones and muscles of your chest. It may take up to 6 weeks to get better. It may take longer if you must stay physically active in your work and activities.  CAUSES  Chest wall pain may happen on its own. However, it may be caused by:  A viral illness like the flu.  Injury.  Coughing.  Exercise.  Arthritis.  Fibromyalgia.  Shingles. HOME CARE INSTRUCTIONS   Avoid overtiring physical activity. Try not to strain or perform activities that cause pain. This includes any activities using your chest or your abdominal and side muscles, especially if heavy weights are used.  Put ice on the sore area.  Put ice in a plastic bag.  Place a towel between your skin and the bag.  Leave the ice on for 15-20 minutes per hour while awake for the first 2 days.  Only take over-the-counter or prescription medicines for pain, discomfort, or fever as directed by your caregiver. SEEK IMMEDIATE MEDICAL CARE IF:   Your pain increases, or you are very uncomfortable.  You have a fever.  Your chest pain becomes worse.  You have new, unexplained symptoms.  You have nausea or vomiting.  You feel sweaty or lightheaded.  You have a cough with phlegm (sputum), or you cough up blood. MAKE SURE YOU:   Understand these instructions.  Will watch your condition.  Will get help right away if you are not doing well or get worse. Document Released: 08/24/2005 Document Revised: 11/16/2011 Document Reviewed: 04/20/2011 [facility] Patient Information 2015 [place], [place]. This information  is not intended to replace advice given to you by your health care provider. Make sure you discuss any questions you have with your health care provider.

## 2015-01-16 NOTE — ED Provider Notes (Signed)
TIME SEEN: 7:30 AM  CHIEF COMPLAINT: chest pain  HPI: Pt is a 39 y.o. with diabetes, hyperlipidemia, previous anterior wall MI in Feb 2015 requiring angioplasty who presents to the emergency department with left-sided chest pressure that radiates into her back that started at 540 this morning. She has associated shortness of breath, nausea, lightheadedness. No diaphoresis. Pain is worse with exertion and also movement and deep inspiration. Patient reports this feels similar to her prior heart attack. Last stress test was 03/06/2014 and was abnormal. She underwent subsequent cardiac catheterization 03/29/2014 which showed no significant obstructive disease, patent stent. She denies cough, fever. No history of PE or DVT, exogenous estrogen use, prolonged immobilization such as long hospitalization, fracture, surgery, trauma. She is obese however and does smoke cigarettes. Denies lower extremity swelling or pain.  ROS: See HPI Constitutional: no fever  Eyes: no drainage  ENT: no runny nose   Cardiovascular:   chest pain  Resp: no SOB  GI: no vomiting GU: no dysuria Integumentary: no rash  Allergy: no hives  Musculoskeletal: no leg swelling  Neurological: no slurred speech ROS otherwise negative  PAST MEDICAL HISTORY/PAST SURGICAL HISTORY:  Past Medical History  Diagnosis Date  . Diabetes mellitus     2002; no meds since 2004  . Acute myocardial infarction of other anterior wall, initial episode of care   . Paroxysmal ventricular tachycardia   . Obesity   . Dyslipidemia, goal LDL below 70 10/26/2013  . STEMI (ST elevation myocardial infarction)   . CAD (coronary artery disease)   . Arthritis   . Dysrhythmia   . Anginal pain   . Asthma   . Shortness of breath   . Depression   . Anxiety   . GERD (gastroesophageal reflux disease)   . Headache(784.0)   . Tobacco abuse     MEDICATIONS:  Prior to Admission medications   Medication Sig Start Date End Date Taking? Authorizing Provider   ALPRAZolam Duanne Moron) 0.5 MG tablet TAKE ONE TABLET BY MOUTH AT BEDTIME AS NEEDED FOR ANXIETY 01/02/15   [name], MD  aspirin EC 81 MG tablet Take 81 mg by mouth daily.    Historical Provider, MD  atorvastatin (LIPITOR) 80 MG tablet Take 1 tablet (80 mg total) by mouth daily. 01/03/15   [name], MD  Biotin 1000 MCG tablet Take 1,000 mcg by mouth 2 (two) times daily.    Historical Provider, MD  canagliflozin (INVOKANA) 300 MG TABS tablet Take 300 mg by mouth daily. 01/03/15   [name], MD  HYDROcodone-acetaminophen ([place]) 10-325 MG per tablet Take 1 tablet by mouth every 8 (eight) hours as needed. 01/02/15   [name], MD  Insulin Glargine (TOUJEO SOLOSTAR) 300 UNIT/ML SOPN Inject 50 Units into the skin daily. 06/14/14   [name], MD  Insulin Pen Needle 32G X 5 MM MISC Use pen needle with victoza insulin injector as directed by your physician. 01/02/15   [name], MD  Liraglutide (VICTOZA) 18 MG/3ML SOPN Inject 0.3 mLs (1.8 mg total) into the skin daily. 01/02/15   [name], MD  Menthol, Topical Analgesic, (BIOFREEZE EX) Apply 1 application topically daily as needed (body pain).    Historical Provider, MD  Multiple Vitamins-Minerals (MULTIVITAMIN PO) Take 1 tablet by mouth daily.    Historical Provider, MD  nitroGLYCERIN (NITROSTAT) 0.4 MG SL tablet Place 0.4 mg under the tongue every 5 (five) minutes as needed for chest pain.    Historical Provider,  MD  potassium chloride SA (K-DUR,KLOR-CON) 20 MEQ tablet Take 1 tablet (20 mEq total) by mouth daily. 01/03/15   [name], MD  ticagrelor (BRILINTA) 90 MG TABS tablet Take 1 tablet (90 mg total) by mouth 2 (two) times daily. 11/07/14   Lorretta Harp, MD    ALLERGIES:  Allergies  Allergen Reactions  . Erythromycin Anaphylaxis  . Penicillins Anaphylaxis  . Metformin And Related Other (See Comments)    diarrhea  . Viibryd [Vilazodone Hcl] Other (See Comments)    Altered mental status.      SOCIAL HISTORY:   History  Substance Use Topics  . Smoking status: Current Every Day Smoker -- 0.50 packs/day for 20 years    Types: Cigarettes  . Smokeless tobacco: Never Used  . Alcohol Use: No     Comment: occasional    FAMILY HISTORY: Family History  Problem Relation Age of Onset  . Arthritis Mother   . Diabetes Mother   . Heart disease Father   . Stroke Father   . Cancer Neg Hx   . Alcohol abuse Neg Hx   . Early death Neg Hx   . Kidney disease Neg Hx   . Hypertension Neg Hx   . Hyperlipidemia Neg Hx   . Diabetes Maternal Grandfather     EXAM: BP 134/88 mmHg  Pulse 93  Temp(Src) 98.3 F (36.8 C) (Oral)  Resp 32  Ht 5' 4.5" (1.638 m)  Wt 237 lb (107.502 kg)  BMI 40.07 kg/m2  SpO2 98%  LMP 12/30/2014 CONSTITUTIONAL: Alert and oriented and responds appropriately to questions. Appears uncomfortable, tearful, not in significant distress, nontoxic, obese HEAD: Normocephalic EYES: Conjunctivae clear, PERRL ENT: normal nose; no rhinorrhea; moist mucous membranes; pharynx without lesions noted NECK: Supple, no meningismus, no LAD  CARD: RRR; S1 and S2 appreciated; no murmurs, no clicks, no rubs, no gallops RESP: Normal chest excursion without splinting or tachypnea; breath sounds clear and equal bilaterally; no wheezes, no rhonchi, no rales, no hypoxia or respiratory distress, speaking full sentences; chest wall is nontender to palpation without crepitus or ecchymosis or deformity ABD/GI: Normal bowel sounds; non-distended; soft, non-tender, no rebound, no guarding, no peritoneal signs BACK:  The back appears normal and is non-tender to palpation, there is no CVA tenderness EXT: Normal ROM in all joints; non-tender to palpation; no edema; normal capillary refill; no cyanosis, no calf tenderness or swelling    SKIN: Normal color for age and race; warm, no rash NEURO: Moves all extremities equally, sensation to light touch intact diffusely, cranial nerves II through XII intact PSYCH: The  patient's mood and manner are appropriate. Grooming and personal hygiene are appropriate.  MEDICAL DECISION MAKING: Patient here with chest pain. Has a history of a stent to her LAD. Describes pain is similar to her prior heart attack. Pain is somewhat atypical also causes is worse with movement and she has pain with sitting upright in bed but I'm not able to reproduce her pain with palpation of her chest wall or back. EKG shows no new ischemic changes. We'll obtain cardiac labs as well as d-dimer given patient has history of tobacco use and is obese and reports her pain is also pleuritic in nature. She reports some mild improvement with aspirin and nitroglycerin. We'll give morphine and Zofran.  ED PROGRESS: 10:00 AM  Patient's labs unremarkable. Negative pregnant. Normal BNP. D-dimer mildly elevated. Will obtain a CT angio of her chest. Chest x-ray clear.  Discussed with cardiology who will  see patient in the emergency department to help with disposition.  10:20 AM  CT chest unremarkable. She has nonspecific adenopathy that is stable from prior CT scan. Spleen appears prominent on CT scan but she has no abdominal pain and I feel this can be followed as an outpatient. Awaiting cardiology evaluation.   10:45 AM  D/w Dr. Mare Ferrari who is seen the patient. He does not feel she needs to be admitted from a cardiac standpoint. He agrees with second troponin. He agrees this is likely chest wall pain. Second troponin negative. Discussed patient CT findings with patient and recommend close outpatient follow-up with her primary care physician. We'll discharge with prescriptions for ibuprofen, tramadol to use as needed for pain. Discussed return precautions. She verbalized understanding and is comfortable with plan.   EKG Interpretation  Date/Time:  Wednesday Jan 16 2015 06:29:35 EDT Ventricular Rate:  100 PR Interval:  182 QRS Duration: 82 QT Interval:  348 QTC Calculation: 448 R Axis:   15 Text  Interpretation:  Normal sinus rhythm Possible Anterior infarct , age undetermined Abnormal ECG No significant change since last tracing Reconfirmed by WARD,  DO, KRISTEN [phone]) on 01/16/2015 7:09:50 AM        [place], DO 01/16/15 1200

## 2015-01-16 NOTE — ED Notes (Signed)
Pt. reports left chest pain with SOB and nausea onset this morning denies emesis or diaphoresis .

## 2015-01-16 NOTE — ED Notes (Signed)
Pt states nitro didn't decrease her chest pain except by 1.

## 2015-01-16 NOTE — ED Notes (Signed)
Off unit with Ct

## 2015-01-16 NOTE — Consult Note (Signed)
CARDIOLOGY CONSULT NOTE   Patient ID: Maureen Price MRN: [id number], DOB/AGE: [birth year]   Admit date: 01/16/2015 Date of Consult: 01/16/2015   Primary Physician: [name], MD Primary Cardiologist: Dr. Quay Burow  Pt. Profile  39 year old woman presents to the emergency room with left-sided chest pain.  Problem List  Past Medical History  Diagnosis Date  . Diabetes mellitus     2002; no meds since 2004  . Acute myocardial infarction of other anterior wall, initial episode of care   . Paroxysmal ventricular tachycardia   . Obesity   . Dyslipidemia, goal LDL below 70 10/26/2013  . STEMI (ST elevation myocardial infarction)   . CAD (coronary artery disease)   . Arthritis   . Dysrhythmia   . Anginal pain   . Asthma   . Shortness of breath   . Depression   . Anxiety   . GERD (gastroesophageal reflux disease)   . Headache(784.0)   . Tobacco abuse   . CHF (congestive heart failure)     Past Surgical History  Procedure Laterality Date  . Cesarean section    . Gallbladder surgery    . Tubal ligation      2000  . Cesarean section  [id number]  . Coronary angioplasty with stent placement    . Left heart cath N/A 10/24/2013    Procedure: LEFT HEART CATH;  Surgeon: Lorretta Harp, MD;  Location: [facility] CATH LAB;  Service: Cardiovascular;  Laterality: N/A;  . Left heart catheterization with coronary angiogram N/A 10/26/2013    Procedure: LEFT HEART CATHETERIZATION WITH CORONARY ANGIOGRAM;  Surgeon: Burnell Blanks, MD;  Location: [facility] CATH LAB;  Service: Cardiovascular;  Laterality: N/A;  . Left heart catheterization with coronary angiogram N/A 03/29/2014    Procedure: LEFT HEART CATHETERIZATION WITH CORONARY ANGIOGRAM;  Surgeon: Lorretta Harp, MD;  Location: [facility] CATH LAB;  Service: Cardiovascular;  Laterality: N/A;  . Cholecystectomy       Allergies  Allergies  Allergen Reactions  . Erythromycin Anaphylaxis  . Penicillins Anaphylaxis  . Metformin  And Related Other (See Comments)    diarrhea  . Viibryd [Vilazodone Hcl] Other (See Comments)    Altered mental status.      HPI   This 39 year old African-American woman presented to the emergency room after awakening this morning with severe left upper chest pain radiating through to the back.  It was associated with nausea but no vomiting.  Questionable diaphoresis.  The pain was worse when she took a deep breath and worse when she would change position or try to sit up. The patient has a past history of known coronary disease.  He had an acute anterior wall myocardial infarction on 10/24/13.  Dr. Gwenlyn Found took her to the Cath Lab.  At that time she underwent successful direct angioplasty of an occluded proximal LAD in the setting of an anterior STEMI with a drug-eluting stent and a door to balloon time of 23 minutes. Because of the large thrombus burden in the LAD as well as visible thrombus in the proximal RCA the patient will be treated with Aggrastat for 18 hours. Angiomax will be continued at low dose for 4 hours after which the sheath will be removed. The patient will be treated with aspirin, Brilenta, beta blocker and statin drug. Cardiac risk factor modification will be stressed including smoking cessation and tighter diabetes control.  2 days later on 10/26/13 she was taken back to the cardiac catheterization lab by Dr. Angelena Form because of  recurrent chest pain.  She was found to have: 1. Single vessel CAD with patent stent proximal LAD 2. Low normal LV systolic function 3. Resolution of thrombus seen in the proximal RCA on 10/24/13. She had trips to the emergency room for chest pain in April 2015 and again in June 2015.  She had a one day Lexiscan Myoview stress test on 03/06/14 which suggested reversible anterior ischemia.  For that reason she underwent another heart catheterization on 03/29/14 by Dr. Gwenlyn Found.  He found that the LAD stent was widely patent and there was no other LAD disease.  He  felt that her chest pain was noncardiac in the history Myoview was a false positive probably related to breast attenuation artifact.  In this emergency room visit her initial troponins are normal.  Her EKG shows no acute changes.  Her d-dimer is elevated but her CT angiogram of the chest shows no evidence of pulmonary emboli.  The CT angiogram of the chest show incomplete visualization of the spleen which appears to be enlarged. Inpatient Medications     Family History Family History  Problem Relation Age of Onset  . Arthritis Mother   . Diabetes Mother   . Heart disease Father   . Stroke Father   . Cancer Neg Hx   . Alcohol abuse Neg Hx   . Early death Neg Hx   . Kidney disease Neg Hx   . Hypertension Neg Hx   . Hyperlipidemia Neg Hx   . Diabetes Maternal Grandfather      Social History History   Social History  . Marital Status: Married    Spouse Name: N/A  . Number of Children: N/A  . Years of Education: N/A   Occupational History  . Not on file.   Social History Main Topics  . Smoking status: Current Every Day Smoker -- 0.50 packs/day for 20 years    Types: Cigarettes  . Smokeless tobacco: Never Used  . Alcohol Use: No     Comment: occasional  . Drug Use: No  . Sexual Activity: Yes    Birth Control/ Protection: Surgical   Other Topics Concern  . Not on file   Social History Narrative   Caffienated drinks- yes   Seat belt use often- yes   Regular Exercise- yes   Smoke alarm in the home- yes   Firearms/guns in the home- no   History of physical abuse- no              Review of Systems  General:  No chills, fever, night sweats or weight changes.  Cardiovascular:  No chest pain, dyspnea on exertion, edema, orthopnea, palpitations, paroxysmal nocturnal dyspnea. Dermatological: No rash, lesions/masses Respiratory: No cough, dyspnea Urologic: No hematuria, dysuria Abdominal:   No nausea, vomiting, diarrhea, bright red blood per rectum, melena, or  hematemesis Neurologic:  No visual changes, wkns, changes in mental status. All other systems reviewed and are otherwise negative except as noted above.  Physical Exam  Blood pressure 105/63, pulse 79, temperature 98.2 F (36.8 C), temperature source Oral, resp. rate 19, height 5' 4.5" (1.638 m), weight 237 lb (107.502 kg), last menstrual period 12/30/2014, SpO2 97 %.  General: Pleasant, NAD Psych: Normal affect. Neuro: Alert and oriented X 3. Moves all extremities spontaneously. HEENT: Normal  Neck: Supple without bruits or JVD. Lungs:  Resp regular and unlabored, CTA. Heart: RRR no s3, s4, or murmurs.  The patient is tender to palpation in the left upper chest Abdomen: Soft,  non-tender, non-distended, BS + x 4.  I cannot feel the spleen. Extremities: No clubbing, cyanosis or edema. DP/PT/Radials 2+ and equal bilaterally.  Labs  No results for input(s): CKTOTAL, CKMB, TROPONINI in the last 72 hours. Lab Results  Component Value Date   WBC 9.1 01/16/2015   HGB 12.6 01/16/2015   HCT 37.5 01/16/2015   MCV 78.1 01/16/2015   PLT 278 01/16/2015     Recent Labs Lab 01/16/15 0709  NA 137  K 3.5  CL 106  CO2 22  BUN <5*  CREATININE 0.43*  CALCIUM 8.6*  GLUCOSE 232*   Lab Results  Component Value Date   CHOL 152 01/02/2015   HDL 24.30* 01/02/2015   LDLCALC 101* 01/02/2015   TRIG 134.0 01/02/2015   Lab Results  Component Value Date   DDIMER 0.69* 01/16/2015    Radiology/Studies  Dg Chest 2 View  01/16/2015   CLINICAL DATA:  Chest pain and shortness of Breath  EXAM: CHEST  2 VIEW  COMPARISON:  10/29/2014  FINDINGS: Cardiac shadow is stable. Mild vascular congestion is noted without interstitial edema. No focal infiltrate or sizable effusion is seen. No bony abnormality is noted.  IMPRESSION: Mild central vascular congestion without pulmonary edema.   Electronically Signed   By: Inez Catalina M.D.   On: 01/16/2015 07:43   Ct Angio Chest Pe W/cm &/or Wo Cm  01/16/2015    CLINICAL DATA:  Chest pain with shortness of breath  EXAM: CT ANGIOGRAPHY CHEST WITH CONTRAST  TECHNIQUE: Multidetector CT imaging of the chest was performed using the standard protocol during bolus administration of intravenous contrast. Multiplanar CT image reconstructions and MIPs were obtained to evaluate the vascular anatomy.  CONTRAST:  132mL OMNIPAQUE IOHEXOL 350 MG/ML SOLN  COMPARISON:  Chest CT October 29, 2014 and chest radiograph Jan 16, 2015  FINDINGS: There is no demonstrable pulmonary embolus. There is no thoracic aortic aneurysm or dissection.  There is stable nonspecific ground-glass type opacity in both lower lobes, most likely atelectatic in etiology. There is no airspace consolidation or edema. No parenchymal lung mass is identified.  The previously noted areas of lymph node prominence remain stable. There is a right hilar lymph node measuring 2.4 x 1.4 cm. There are several sub- carinal lymph nodes, largest measuring 1.8 x 1.3 cm. Several slightly smaller lymph nodes are noted. No new adenopathy appreciable.  Heart is mildly enlarged. There is no appreciable pericardial thickening. There is calcification in the left anterior descending coronary artery. Thyroid is unchanged in size and contour without focal thyroid lesion appreciable.  In the visualized upper abdomen, spleen is incompletely visualized but does appear prominent. The spleen measures 14.3 cm in length.  There is degenerative change in the thoracic spine. There are no blastic or lytic bone lesions.  Review of the MIP images confirms the above findings.  IMPRESSION: The appearance is stable compared to prior CT examination from February 2016. There is no demonstrable pulmonary embolus. Probable lower lobe atelectatic change, stable. Nonspecific adenopathy is stable. Spleen appears prominent. The possibility of underlying lymphoproliferative disorder given splenic prominence and adenopathy must be of concern. There is coronary artery  calcification.   Electronically Signed   By: Lowella Grip III M.D.   On: 01/16/2015 10:14    ECG  Normal sinus rhythm.  No ischemic changes.  ASSESSMENT AND PLAN  1.  Chest pain probably musculoskeletal.  No evidence at this time of any recurrent myocardial ischemia or damage. 2.  Poorly controlled diabetes  with recent hemoglobin A1c of 10 3.  Ongoing tobacco abuse 4.  Possible splenomegaly and enlarged intrathoracic lymph nodes.  The possibility of lymphoproliferative disorder is raised.  Recommendation: Await results of second troponin and if normal she could be discharged home from the cardiac standpoint to continue her current cardiac regimen including dual antiplatelet therapy with aspirin and Brilinta.  She should have outpatient follow-up of the questionable splenomegaly and lymphadenopathy by internal medicine.  I once again strongly advised tobacco cessation in view of her other risk factors.   Max Fickle, MD  01/16/2015, 10:45 AM

## 2015-01-16 NOTE — ED Notes (Signed)
Pt resting at this time.

## 2015-01-21 ENCOUNTER — Emergency Department (HOSPITAL_COMMUNITY): Payer: 59

## 2015-01-21 ENCOUNTER — Encounter (HOSPITAL_COMMUNITY): Payer: Self-pay | Admitting: *Deleted

## 2015-01-21 ENCOUNTER — Emergency Department (HOSPITAL_COMMUNITY)
Admission: EM | Admit: 2015-01-21 | Discharge: 2015-01-21 | Disposition: A | Discharge status: Home / Self Care | Payer: 59 | Attending: Emergency Medicine | Admitting: Emergency Medicine

## 2015-01-21 DIAGNOSIS — Z9049 Acquired absence of other specified parts of digestive tract: Secondary | ICD-10-CM | POA: Diagnosis not present

## 2015-01-21 DIAGNOSIS — R1032 Left lower quadrant pain: Secondary | ICD-10-CM | POA: Diagnosis not present

## 2015-01-21 DIAGNOSIS — I2510 Atherosclerotic heart disease of native coronary artery without angina pectoris: Secondary | ICD-10-CM | POA: Diagnosis not present

## 2015-01-21 DIAGNOSIS — R079 Chest pain, unspecified: Secondary | ICD-10-CM | POA: Insufficient documentation

## 2015-01-21 DIAGNOSIS — E669 Obesity, unspecified: Secondary | ICD-10-CM | POA: Insufficient documentation

## 2015-01-21 DIAGNOSIS — E119 Type 2 diabetes mellitus without complications: Secondary | ICD-10-CM | POA: Diagnosis not present

## 2015-01-21 DIAGNOSIS — Z79899 Other long term (current) drug therapy: Secondary | ICD-10-CM | POA: Insufficient documentation

## 2015-01-21 DIAGNOSIS — R110 Nausea: Secondary | ICD-10-CM | POA: Insufficient documentation

## 2015-01-21 DIAGNOSIS — Z720 Tobacco use: Secondary | ICD-10-CM | POA: Insufficient documentation

## 2015-01-21 DIAGNOSIS — Z7982 Long term (current) use of aspirin: Secondary | ICD-10-CM | POA: Diagnosis not present

## 2015-01-21 DIAGNOSIS — F419 Anxiety disorder, unspecified: Secondary | ICD-10-CM | POA: Diagnosis not present

## 2015-01-21 DIAGNOSIS — I252 Old myocardial infarction: Secondary | ICD-10-CM | POA: Insufficient documentation

## 2015-01-21 DIAGNOSIS — J45909 Unspecified asthma, uncomplicated: Secondary | ICD-10-CM | POA: Diagnosis not present

## 2015-01-21 DIAGNOSIS — Z9861 Coronary angioplasty status: Secondary | ICD-10-CM | POA: Insufficient documentation

## 2015-01-21 DIAGNOSIS — Z9889 Other specified postprocedural states: Secondary | ICD-10-CM | POA: Insufficient documentation

## 2015-01-21 DIAGNOSIS — Z794 Long term (current) use of insulin: Secondary | ICD-10-CM | POA: Diagnosis not present

## 2015-01-21 DIAGNOSIS — Z880 Allergy status to penicillin: Secondary | ICD-10-CM | POA: Diagnosis not present

## 2015-01-21 DIAGNOSIS — F329 Major depressive disorder, single episode, unspecified: Secondary | ICD-10-CM | POA: Insufficient documentation

## 2015-01-21 DIAGNOSIS — R1012 Left upper quadrant pain: Secondary | ICD-10-CM

## 2015-01-21 DIAGNOSIS — I509 Heart failure, unspecified: Secondary | ICD-10-CM | POA: Diagnosis not present

## 2015-01-21 LAB — CBC WITH DIFFERENTIAL/PLATELET
Basophils Absolute: 0 10*3/uL (ref 0.0–0.1)
Basophils Relative: 0 % (ref 0–1)
Eosinophils Absolute: 0.2 10*3/uL (ref 0.0–0.7)
Eosinophils Relative: 2 % (ref 0–5)
HCT: 37.7 % (ref 36.0–46.0)
Hemoglobin: 13.2 g/dL (ref 12.0–15.0)
LYMPHS ABS: 2.9 10*3/uL (ref 0.7–4.0)
LYMPHS PCT: 31 % (ref 12–46)
MCH: 27.4 pg (ref 26.0–34.0)
MCHC: 35 g/dL (ref 30.0–36.0)
MCV: 78.2 fL (ref 78.0–100.0)
Monocytes Absolute: 0.5 10*3/uL (ref 0.1–1.0)
Monocytes Relative: 6 % (ref 3–12)
NEUTROS ABS: 5.7 10*3/uL (ref 1.7–7.7)
Neutrophils Relative %: 61 % (ref 43–77)
PLATELETS: 273 10*3/uL (ref 150–400)
RBC: 4.82 MIL/uL (ref 3.87–5.11)
RDW: 13.7 % (ref 11.5–15.5)
WBC: 9.2 10*3/uL (ref 4.0–10.5)

## 2015-01-21 LAB — URINALYSIS, ROUTINE W REFLEX MICROSCOPIC
Bilirubin Urine: NEGATIVE
GLUCOSE, UA: 500 mg/dL — AB
HGB URINE DIPSTICK: NEGATIVE
KETONES UR: NEGATIVE mg/dL
LEUKOCYTES UA: NEGATIVE
Nitrite: NEGATIVE
PH: 5.5 (ref 5.0–8.0)
Protein, ur: NEGATIVE mg/dL
Specific Gravity, Urine: 1.011 (ref 1.005–1.030)
Urobilinogen, UA: 0.2 mg/dL (ref 0.0–1.0)

## 2015-01-21 LAB — COMPREHENSIVE METABOLIC PANEL
ALBUMIN: 3.3 g/dL — AB (ref 3.5–5.0)
ALT: 13 U/L — ABNORMAL LOW (ref 14–54)
ANION GAP: 11 (ref 5–15)
AST: 16 U/L (ref 15–41)
Alkaline Phosphatase: 89 U/L (ref 38–126)
BILIRUBIN TOTAL: 0.7 mg/dL (ref 0.3–1.2)
CHLORIDE: 104 mmol/L (ref 101–111)
CO2: 21 mmol/L — AB (ref 22–32)
CREATININE: 0.5 mg/dL (ref 0.44–1.00)
Calcium: 8.6 mg/dL — ABNORMAL LOW (ref 8.9–10.3)
GFR calc Af Amer: >60 mL/min (ref >60)
GFR calc non Af Amer: >60 mL/min (ref >60)
Glucose, Bld: 282 mg/dL — ABNORMAL HIGH (ref 65–99)
Potassium: 4.3 mmol/L (ref 3.5–5.1)
SODIUM: 136 mmol/L (ref 135–145)
TOTAL PROTEIN: 6.6 g/dL (ref 6.5–8.1)

## 2015-01-21 LAB — I-STAT TROPONIN, ED
Troponin i, poc: 0.01 ng/mL (ref 0.00–0.08)
Troponin i, poc: 0.01 ng/mL (ref 0.00–0.08)

## 2015-01-21 LAB — PREGNANCY, URINE: Preg Test, Ur: NEGATIVE

## 2015-01-21 LAB — LIPASE, BLOOD: Lipase: 18 U/L — ABNORMAL LOW (ref 22–51)

## 2015-01-21 MED ORDER — OXYCODONE-ACETAMINOPHEN 5-325 MG PO TABS
2.0000 | ORAL_TABLET | Freq: Once | ORAL | Status: AC
  Administered 2015-01-21: 2 via ORAL
  Filled 2015-01-21: qty 2

## 2015-01-21 MED ORDER — HYDROMORPHONE HCL 1 MG/ML IJ SOLN
0.5000 mg | Freq: Once | INTRAMUSCULAR | Status: AC
  Administered 2015-01-21: 0.5 mg via INTRAVENOUS
  Filled 2015-01-21: qty 1

## 2015-01-21 MED ORDER — HYDROMORPHONE HCL 1 MG/ML IJ SOLN: 1.0000 mg | Freq: Once | INTRAMUSCULAR | Status: DC

## 2015-01-21 NOTE — ED Provider Notes (Signed)
CSN: [id number]     Arrival date & time 01/21/15  0539 History   First MD Initiated Contact with Patient 01/21/15 [phone]     Chief Complaint  Patient presents with  . Chest Pain  . Abdominal Pain     (Consider location/radiation/quality/duration/timing/severity/associated sxs/prior Treatment) Patient is a 39 y.o. female presenting with chest pain and abdominal pain.  Chest Pain Pain location:  L lateral chest Pain quality: sharp   Pain radiates to:  Does not radiate Pain radiates to the back: no   Pain severity:  Severe Onset quality:  Gradual Duration:  1 week Timing:  Intermittent Progression:  Worsening Chronicity:  New Context: at rest   Context: not breathing   Relieved by:  Nothing Exacerbated by: movement, palpation. Associated symptoms: abdominal pain and nausea   Associated symptoms: no fever and not vomiting   Associated symptoms comment:  Diarrhea Abdominal pain:    Location:  LUQ and LLQ   Quality:  Sharp   Severity:  Severe   Onset quality:  Gradual   Duration:  1 week   Timing:  Intermittent   Progression:  Worsening   Chronicity:  New Abdominal Pain Associated symptoms: chest pain and nausea   Associated symptoms: no fever and no vomiting     Past Medical History  Diagnosis Date  . Diabetes mellitus     2002; no meds since 2004  . Acute myocardial infarction of other anterior wall, initial episode of care   . Paroxysmal ventricular tachycardia   . Obesity   . Dyslipidemia, goal LDL below 70 10/26/2013  . STEMI (ST elevation myocardial infarction)   . CAD (coronary artery disease)   . Arthritis   . Dysrhythmia   . Anginal pain   . Asthma   . Shortness of breath   . Depression   . Anxiety   . GERD (gastroesophageal reflux disease)   . Headache(784.0)   . Tobacco abuse   . CHF (congestive heart failure)    Past Surgical History  Procedure Laterality Date  . Cesarean section    . Gallbladder surgery    . Tubal ligation      2000  .  Cesarean section  [id number]  . Coronary angioplasty with stent placement    . Left heart cath N/A 10/24/2013    Procedure: LEFT HEART CATH;  Surgeon: Lorretta Harp, MD;  Location: [facility] CATH LAB;  Service: Cardiovascular;  Laterality: N/A;  . Left heart catheterization with coronary angiogram N/A 10/26/2013    Procedure: LEFT HEART CATHETERIZATION WITH CORONARY ANGIOGRAM;  Surgeon: Burnell Blanks, MD;  Location: [facility] CATH LAB;  Service: Cardiovascular;  Laterality: N/A;  . Left heart catheterization with coronary angiogram N/A 03/29/2014    Procedure: LEFT HEART CATHETERIZATION WITH CORONARY ANGIOGRAM;  Surgeon: Lorretta Harp, MD;  Location: [facility] CATH LAB;  Service: Cardiovascular;  Laterality: N/A;  . Cholecystectomy     Family History  Problem Relation Age of Onset  . Arthritis Mother   . Diabetes Mother   . Heart disease Father   . Stroke Father   . Cancer Neg Hx   . Alcohol abuse Neg Hx   . Early death Neg Hx   . Kidney disease Neg Hx   . Hypertension Neg Hx   . Hyperlipidemia Neg Hx   . Diabetes Maternal Grandfather    History  Substance Use Topics  . Smoking status: Current Every Day Smoker -- 0.50 packs/day for 20 years    Types: Cigarettes  .  Smokeless tobacco: Never Used  . Alcohol Use: No     Comment: occasional   OB History    Gravida Para Term Preterm AB TAB SAB Ectopic Multiple Living   4 3 3  1  1   3      Review of Systems  Constitutional: Negative for fever.  Cardiovascular: Positive for chest pain.  Gastrointestinal: Positive for nausea and abdominal pain. Negative for vomiting.  All other systems reviewed and are negative.     Allergies  Erythromycin; Penicillins; Metformin and related; and Viibryd  Home Medications   Prior to Admission medications   Medication Sig Start Date End Date Taking? Authorizing Provider  ALPRAZolam Duanne Moron) 0.5 MG tablet TAKE ONE TABLET BY MOUTH AT BEDTIME AS NEEDED FOR ANXIETY 01/02/15  Yes [name], MD   aspirin EC 81 MG tablet Take 81 mg by mouth daily.   Yes Historical Provider, MD  atorvastatin (LIPITOR) 80 MG tablet Take 1 tablet (80 mg total) by mouth daily. 01/03/15  Yes [name], MD  Biotin 1000 MCG tablet Take 1,000 mcg by mouth 2 (two) times daily.   Yes Historical Provider, MD  canagliflozin (INVOKANA) 300 MG TABS tablet Take 300 mg by mouth daily. 01/03/15  Yes [name], MD  GARLIC PO Take 1 tablet by mouth daily.   Yes Historical Provider, MD  HYDROcodone-acetaminophen (NORCO) 10-325 MG per tablet Take 1 tablet by mouth every 8 (eight) hours as needed. 01/02/15  Yes [name], MD  Insulin Glargine (TOUJEO SOLOSTAR) 300 UNIT/ML SOPN Inject 50 Units into the skin daily. 06/14/14  Yes [name], MD  Liraglutide (VICTOZA) 18 MG/3ML SOPN Inject 0.3 mLs (1.8 mg total) into the skin daily. 01/02/15  Yes [name], MD  Menthol, Topical Analgesic, (BIOFREEZE EX) Apply 1 application topically daily as needed (body pain).   Yes Historical Provider, MD  Multiple Vitamins-Minerals (MULTIVITAMIN PO) Take 1 tablet by mouth daily.   Yes Historical Provider, MD  nitroGLYCERIN (NITROSTAT) 0.4 MG SL tablet Place 0.4 mg under the tongue every 5 (five) minutes as needed for chest pain.   Yes Historical Provider, MD  Omega-3 Fatty Acids (FISH OIL PO) Take 1 tablet by mouth daily.   Yes Historical Provider, MD  ticagrelor (BRILINTA) 90 MG TABS tablet Take 1 tablet (90 mg total) by mouth 2 (two) times daily. 11/07/14  Yes Lorretta Harp, MD  ibuprofen (ADVIL,MOTRIN) 800 MG tablet Take 1 tablet (800 mg total) by mouth every 8 (eight) hours as needed for mild pain. 01/16/15   Kristen N Ward, DO  Insulin Pen Needle 32G X 5 MM MISC Use pen needle with victoza insulin injector as directed by your physician. 01/02/15   [name], MD  potassium chloride SA (K-DUR,KLOR-CON) 20 MEQ tablet Take 1 tablet (20 mEq total) by mouth daily. 01/03/15   [name], MD  traMADol (ULTRAM) 50 MG tablet  Take 1 tablet (50 mg total) by mouth every 6 (six) hours as needed. 01/16/15   Kristen N Ward, DO   BP 109/61 mmHg  Pulse 81  Temp(Src) 98.3 F (36.8 C) (Oral)  Resp 22  Ht 5\' 4"  (1.626 m)  Wt 237 lb (107.502 kg)  BMI 40.66 kg/m2  SpO2 98%  LMP 12/07/2014 Physical Exam  Constitutional: She is oriented to person, place, and time. She appears well-developed and well-nourished.  HENT:  Head: Normocephalic and atraumatic.  Right Ear: External ear normal.  Left Ear: External ear  normal.  Eyes: Conjunctivae and EOM are normal. Pupils are equal, round, and reactive to light.  Neck: Normal range of motion. Neck supple.  Cardiovascular: Normal rate, regular rhythm, normal heart sounds and intact distal pulses.   Pulmonary/Chest: Effort normal and breath sounds normal. She exhibits tenderness and bony tenderness.  Abdominal: Soft. Bowel sounds are normal. There is tenderness in the left upper quadrant and left lower quadrant.  Musculoskeletal: Normal range of motion.  Neurological: She is alert and oriented to person, place, and time.  Skin: Skin is warm and dry.  Vitals reviewed.   ED Course  Procedures (including critical care time) Labs Review Labs Reviewed  COMPREHENSIVE METABOLIC PANEL - Abnormal; Notable for the following:    CO2 21 (*)    Glucose, Bld 282 (*)    BUN <5 (*)    Calcium 8.6 (*)    Albumin 3.3 (*)    ALT 13 (*)    All other components within normal limits  LIPASE, BLOOD - Abnormal; Notable for the following:    Lipase 18 (*)    All other components within normal limits  URINALYSIS, ROUTINE W REFLEX MICROSCOPIC - Abnormal; Notable for the following:    Glucose, UA 500 (*)    All other components within normal limits  CBC WITH DIFFERENTIAL/PLATELET  PREGNANCY, URINE  I-STAT TROPOININ, ED  POC URINE PREG, ED  Randolm Idol, ED    Imaging Review Dg Chest 2 View  01/21/2015   CLINICAL DATA:  Chest pain.  Initial evaluation.  EXAM: CHEST  2 VIEW   COMPARISON:  01/16/2015, 09/14/2014.  Chest CT 01/16/2015.  FINDINGS: Mediastinum and hilar structures are normal. Lungs are clear of acute infiltrates. Stable chronic interstitial prominence . Low lung volumes with mild basilar atelectasis. No pleural effusion or pneumothorax. Heart size stable .  IMPRESSION: No acute cardiopulmonary disease.  Stable chest .   Electronically Signed   By: Marcello Moores  Register   On: 01/21/2015 08:34     EKG Interpretation   Date/Time:  Monday Jan 21 2015 06:50:35 EDT Ventricular Rate:  95 PR Interval:  198 QRS Duration: 88 QT Interval:  347 QTC Calculation: 436 R Axis:   14 Text Interpretation:  Sinus rhythm Borderline prolonged PR interval  Probable anteroseptal infarct, old No significant change since last  tracing Confirmed by Debby Freiberg [phone]) on 01/21/2015 7:00:13 AM      MDM   Final diagnoses:  Chest pain, unspecified chest pain type  Left upper quadrant pain    39 y.o. female with pertinent PMH of CAD sp DES, asthma, GERD, DM presents with chest and abd pain as above.  Pain paroxysmal, present for 1 week, worsened yesterday and pt was concerned about cancer so presented this morning as her fu visit is in 4 days.  Physical exam with chest wall and L sided tenderness.    Wu unremarkable.  Discussed repeat imaging and with shared decision making agreed to defer.  Pt had symptomatic improvement in ED.  DC home to fu with PCP.  I have reviewed all laboratory and imaging studies if ordered as above  1. Chest pain, unspecified chest pain type   2. Left upper quadrant pain         Debby Freiberg, MD 01/21/15 1155

## 2015-01-21 NOTE — ED Notes (Signed)
Dr. Colin Rhein at bedside with patient and family.

## 2015-01-21 NOTE — ED Notes (Signed)
Pt. States she was seen a couple of days ago for the same thing. A CT was done and she was told that she had an enlarged spleen. At discharge pt. Was told to come back if her symptoms got worse. Pt. States she woke up suddenly last night because she felt a sudden pressure in her chest. Pt. States she also noticed her side looks a little more swollen.

## 2015-01-21 NOTE — ED Notes (Signed)
Patient alert and oriented at discharge.  Patient taking to the waiting room with EMT and family in a wheelchair.

## 2015-01-21 NOTE — Discharge Instructions (Signed)

## 2015-01-24 ENCOUNTER — Ambulatory Visit: Payer: 59 | Admitting: Internal Medicine

## 2015-01-30 ENCOUNTER — Ambulatory Visit: Payer: 59 | Admitting: Internal Medicine

## 2015-01-30 DIAGNOSIS — Z0289 Encounter for other administrative examinations: Secondary | ICD-10-CM

## 2015-04-15 ENCOUNTER — Telehealth: Payer: Self-pay

## 2015-04-15 NOTE — Telephone Encounter (Signed)
Received pharmacy rejection stating that insurance will not cover [employer]  without a prior authorization. PA submitted to insurance via covermymeds, approval now pending their decision.

## 2015-04-18 NOTE — Telephone Encounter (Signed)
Pa approved. Pharmacy Notified

## 2015-04-22 ENCOUNTER — Telehealth: Payer: Self-pay | Admitting: Cardiovascular Disease

## 2015-04-22 MED ORDER — TICAGRELOR 90 MG PO TABS: 90.0000 mg | ORAL_TABLET | Freq: Two times a day (BID) | ORAL | Status: DC

## 2015-04-22 NOTE — Telephone Encounter (Signed)
Samples at front desk, pt notified. 

## 2015-04-22 NOTE — Telephone Encounter (Signed)
Maureen Price is calling to see if she can get some samples of Brilinta .Marland Kitchen Please call .Marland Kitchen   Thanks

## 2015-06-10 ENCOUNTER — Emergency Department (HOSPITAL_COMMUNITY)
Admission: EM | Admit: 2015-06-10 | Discharge: 2015-06-10 | Disposition: A | Discharge status: Home / Self Care | Payer: BLUE CROSS/BLUE SHIELD | Attending: Emergency Medicine | Admitting: Emergency Medicine

## 2015-06-10 ENCOUNTER — Encounter (HOSPITAL_COMMUNITY): Payer: Self-pay

## 2015-06-10 DIAGNOSIS — F419 Anxiety disorder, unspecified: Secondary | ICD-10-CM | POA: Insufficient documentation

## 2015-06-10 DIAGNOSIS — F329 Major depressive disorder, single episode, unspecified: Secondary | ICD-10-CM | POA: Insufficient documentation

## 2015-06-10 DIAGNOSIS — Z720 Tobacco use: Secondary | ICD-10-CM | POA: Diagnosis not present

## 2015-06-10 DIAGNOSIS — Z880 Allergy status to penicillin: Secondary | ICD-10-CM | POA: Insufficient documentation

## 2015-06-10 DIAGNOSIS — Z9889 Other specified postprocedural states: Secondary | ICD-10-CM | POA: Insufficient documentation

## 2015-06-10 DIAGNOSIS — I25119 Atherosclerotic heart disease of native coronary artery with unspecified angina pectoris: Secondary | ICD-10-CM | POA: Insufficient documentation

## 2015-06-10 DIAGNOSIS — I509 Heart failure, unspecified: Secondary | ICD-10-CM | POA: Insufficient documentation

## 2015-06-10 DIAGNOSIS — I252 Old myocardial infarction: Secondary | ICD-10-CM | POA: Diagnosis not present

## 2015-06-10 DIAGNOSIS — E119 Type 2 diabetes mellitus without complications: Secondary | ICD-10-CM | POA: Diagnosis not present

## 2015-06-10 DIAGNOSIS — J45909 Unspecified asthma, uncomplicated: Secondary | ICD-10-CM | POA: Diagnosis not present

## 2015-06-10 DIAGNOSIS — E785 Hyperlipidemia, unspecified: Secondary | ICD-10-CM | POA: Diagnosis not present

## 2015-06-10 DIAGNOSIS — Z794 Long term (current) use of insulin: Secondary | ICD-10-CM | POA: Diagnosis not present

## 2015-06-10 DIAGNOSIS — Z79899 Other long term (current) drug therapy: Secondary | ICD-10-CM | POA: Insufficient documentation

## 2015-06-10 DIAGNOSIS — K0889 Other specified disorders of teeth and supporting structures: Secondary | ICD-10-CM | POA: Diagnosis present

## 2015-06-10 DIAGNOSIS — M1990 Unspecified osteoarthritis, unspecified site: Secondary | ICD-10-CM | POA: Insufficient documentation

## 2015-06-10 DIAGNOSIS — Z8719 Personal history of other diseases of the digestive system: Secondary | ICD-10-CM | POA: Diagnosis not present

## 2015-06-10 DIAGNOSIS — Z7982 Long term (current) use of aspirin: Secondary | ICD-10-CM | POA: Insufficient documentation

## 2015-06-10 DIAGNOSIS — E663 Overweight: Secondary | ICD-10-CM | POA: Diagnosis not present

## 2015-06-10 DIAGNOSIS — K029 Dental caries, unspecified: Secondary | ICD-10-CM | POA: Diagnosis not present

## 2015-06-10 DIAGNOSIS — E669 Obesity, unspecified: Secondary | ICD-10-CM | POA: Diagnosis not present

## 2015-06-10 MED ORDER — CLINDAMYCIN HCL 300 MG PO CAPS: 300.0000 mg | ORAL_CAPSULE | Freq: Three times a day (TID) | ORAL | Status: DC

## 2015-06-10 MED ORDER — HYDROCODONE-ACETAMINOPHEN 5-325 MG PO TABS: 1.0000 | ORAL_TABLET | ORAL | Status: DC

## 2015-06-10 MED ORDER — HYDROMORPHONE HCL 1 MG/ML IJ SOLN
1.0000 mg | Freq: Once | INTRAMUSCULAR | Status: AC
  Administered 2015-06-10: 1 mg via INTRAMUSCULAR
  Filled 2015-06-10: qty 1

## 2015-06-10 MED ORDER — TRAMADOL HCL 50 MG PO TABS: 50.0000 mg | ORAL_TABLET | Freq: Four times a day (QID) | ORAL | Status: DC | PRN

## 2015-06-10 MED ORDER — CLINDAMYCIN HCL 300 MG PO CAPS
300.0000 mg | ORAL_CAPSULE | Freq: Once | ORAL | Status: AC
  Administered 2015-06-10: 300 mg via ORAL
  Filled 2015-06-10: qty 1

## 2015-06-10 NOTE — ED Notes (Signed)
PT ambulated with baseline gait; VSS; A&Ox3; no signs of distress; respirations even and unlabored; skin warm and dry; no questions upon discharge.  

## 2015-06-10 NOTE — Discharge Instructions (Signed)
Dental Pain  A tooth ache may be caused by cavities (tooth decay). Cavities expose the nerve of the tooth to air and hot or cold temperatures. It may come from an infection or abscess (also called a boil or furuncle) around your tooth. It is also often caused by dental caries (tooth decay). This causes the pain you are having.  DIAGNOSIS   Your caregiver can diagnose this problem by exam.  TREATMENT   · If caused by an infection, it may be treated with medications which kill germs (antibiotics) and pain medications as prescribed by your caregiver. Take medications as directed.  · Only take over-the-counter or prescription medicines for pain, discomfort, or fever as directed by your caregiver.  · Whether the tooth ache today is caused by infection or dental disease, you should see your dentist as soon as possible for further care.  SEEK MEDICAL CARE IF:  The exam and treatment you received today has been provided on an emergency basis only. This is not a substitute for complete medical or dental care. If your problem worsens or new problems (symptoms) appear, and you are unable to meet with your dentist, call or return to this location.  SEEK IMMEDIATE MEDICAL CARE IF:   · You have a fever.  · You develop redness and swelling of your face, jaw, or neck.  · You are unable to open your mouth.  · You have severe pain uncontrolled by pain medicine.  MAKE SURE YOU:   · Understand these instructions.  · Will watch your condition.  · Will get help right away if you are not doing well or get worse.  Document Released: 08/24/2005 Document Revised: 11/16/2011 Document Reviewed: 04/11/2008  ExitCare® Patient Information ©2015 [employer]. This information is not intended to replace advice given to you by your health care provider. Make sure you discuss any questions you have with your health care provider.    Dental Caries  Dental caries is tooth decay. This decay can cause a hole in teeth (cavity) that can get bigger and  deeper over time.  HOME CARE  · Brush and floss your teeth. Do this at least two times a day.  · Use a fluoride toothpaste.  · Use a mouth rinse if told by your dentist or doctor.  · Eat less sugary and starchy foods. Drink less sugary drinks.  · Avoid snacking often on sugary and starchy foods. Avoid sipping often on sugary drinks.  · Keep regular checkups and cleanings with your dentist.  · Use fluoride supplements if told by your dentist or doctor.  · Allow fluoride to be applied to teeth if told by your dentist or doctor.  Document Released: 06/02/2008 Document Revised: 01/08/2014 Document Reviewed: 08/26/2012  ExitCare® Patient Information ©2015 [employer]. This information is not intended to replace advice given to you by your health care provider. Make sure you discuss any questions you have with your health care provider.

## 2015-06-10 NOTE — ED Provider Notes (Signed)
CSN: [id number]     Arrival date & time 06/10/15  0612 History   First MD Initiated Contact with Patient 06/10/15 0701     Chief Complaint  Patient presents with  . Dental Pain   Patient is a 39 y.o. female presenting with tooth pain. The history is provided by the patient.  Dental Pain Location:  Lower Lower teeth location: lower right side. Quality:  Throbbing Severity:  Severe Onset quality:  Gradual Duration:  3 days Timing:  Constant Progression:  Worsening Chronicity:  New Associated symptoms: no fever    patient has a history of dental caries. She has had to have teeth pulled in the past. Patient does not have a dentist. She feels like the pain is moving up towards her right cheek and eye. It feels like her face is swollen. She is not having any difficulty swallowing. It does hurt to chew  Past Medical History  Diagnosis Date  . Diabetes mellitus     2002; no meds since 2004  . Acute myocardial infarction of other anterior wall, initial episode of care   . Paroxysmal ventricular tachycardia ([place])   . Obesity   . Dyslipidemia, goal LDL below 70 10/26/2013  . STEMI (ST elevation myocardial infarction) ([place])   . CAD (coronary artery disease)   . Arthritis   . Dysrhythmia   . Anginal pain ([place])   . Asthma   . Shortness of breath   . Depression   . Anxiety   . GERD (gastroesophageal reflux disease)   . Headache(784.0)   . Tobacco abuse   . CHF (congestive heart failure) [facility])    Past Surgical History  Procedure Laterality Date  . Cesarean section    . Gallbladder surgery    . Tubal ligation      2000  . Cesarean section  [id number]  . Coronary angioplasty with stent placement    . Left heart cath N/A 10/24/2013    Procedure: LEFT HEART CATH;  Surgeon: Lorretta Harp, MD;  Location: [facility] CATH LAB;  Service: Cardiovascular;  Laterality: N/A;  . Left heart catheterization with coronary angiogram N/A 10/26/2013    Procedure: LEFT HEART CATHETERIZATION WITH CORONARY  ANGIOGRAM;  Surgeon: Burnell Blanks, MD;  Location: [facility] CATH LAB;  Service: Cardiovascular;  Laterality: N/A;  . Left heart catheterization with coronary angiogram N/A 03/29/2014    Procedure: LEFT HEART CATHETERIZATION WITH CORONARY ANGIOGRAM;  Surgeon: Lorretta Harp, MD;  Location: [facility] CATH LAB;  Service: Cardiovascular;  Laterality: N/A;  . Cholecystectomy     Family History  Problem Relation Age of Onset  . Arthritis Mother   . Diabetes Mother   . Heart disease Father   . Stroke Father   . Cancer Neg Hx   . Alcohol abuse Neg Hx   . Early death Neg Hx   . Kidney disease Neg Hx   . Hypertension Neg Hx   . Hyperlipidemia Neg Hx   . Diabetes Maternal Grandfather    Social History  Substance Use Topics  . Smoking status: Current Every Day Smoker -- 0.50 packs/day for 20 years    Types: Cigarettes  . Smokeless tobacco: Never Used  . Alcohol Use: No     Comment: occasional   OB History    Gravida Para Term Preterm AB TAB SAB Ectopic Multiple Living   4 3 3  1  1   3      Review of Systems  Constitutional: Negative for fever.  Respiratory:  Negative for cough and shortness of breath.   Genitourinary: Negative for dysuria.  All other systems reviewed and are negative.     Allergies  Erythromycin; Penicillins; Metformin and related; and Viibryd  Home Medications   Prior to Admission medications   Medication Sig Start Date End Date Taking? Authorizing Provider  ALPRAZolam Duanne Moron) 0.5 MG tablet TAKE ONE TABLET BY MOUTH AT BEDTIME AS NEEDED FOR ANXIETY 01/02/15   [name], MD  aspirin EC 81 MG tablet Take 81 mg by mouth daily.    Historical Provider, MD  atorvastatin (LIPITOR) 80 MG tablet Take 1 tablet (80 mg total) by mouth daily. 01/03/15   [name], MD  Biotin 1000 MCG tablet Take 1,000 mcg by mouth 2 (two) times daily.    Historical Provider, MD  canagliflozin (INVOKANA) 300 MG TABS tablet Take 300 mg by mouth daily. 01/03/15   [name], MD   GARLIC PO Take 1 tablet by mouth daily.    Historical Provider, MD  HYDROcodone-acetaminophen (NORCO) 10-325 MG per tablet Take 1 tablet by mouth every 8 (eight) hours as needed. 01/02/15   [name], MD  ibuprofen (ADVIL,MOTRIN) 800 MG tablet Take 1 tablet (800 mg total) by mouth every 8 (eight) hours as needed for mild pain. 01/16/15   Kristen N Ward, DO  Insulin Glargine (TOUJEO SOLOSTAR) 300 UNIT/ML SOPN Inject 50 Units into the skin daily. 06/14/14   [name], MD  Insulin Pen Needle 32G X 5 MM MISC Use pen needle with victoza insulin injector as directed by your physician. 01/02/15   [name], MD  Liraglutide (VICTOZA) 18 MG/3ML SOPN Inject 0.3 mLs (1.8 mg total) into the skin daily. 01/02/15   [name], MD  Menthol, Topical Analgesic, (BIOFREEZE EX) Apply 1 application topically daily as needed (body pain).    Historical Provider, MD  Multiple Vitamins-Minerals (MULTIVITAMIN PO) Take 1 tablet by mouth daily.    Historical Provider, MD  nitroGLYCERIN (NITROSTAT) 0.4 MG SL tablet Place 0.4 mg under the tongue every 5 (five) minutes as needed for chest pain.    Historical Provider, MD  Omega-3 Fatty Acids (FISH OIL PO) Take 1 tablet by mouth daily.    Historical Provider, MD  potassium chloride SA (K-DUR,KLOR-CON) 20 MEQ tablet Take 1 tablet (20 mEq total) by mouth daily. 01/03/15   [name], MD  ticagrelor (BRILINTA) 90 MG TABS tablet Take 1 tablet (90 mg total) by mouth 2 (two) times daily. 04/22/15   Lorretta Harp, MD  traMADol (ULTRAM) 50 MG tablet Take 1 tablet (50 mg total) by mouth every 6 (six) hours as needed. 01/16/15   Kristen N Ward, DO   BP 125/70 mmHg  Pulse 87  Temp(Src) 98.7 F (37.1 C) (Oral)  Resp 24  Ht 5\' 5"  (1.651 m)  Wt 235 lb (106.595 kg)  BMI 39.11 kg/m2  SpO2 97%  LMP 06/10/2015 Physical Exam  Constitutional: No distress.  Overweight  HENT:  Head: Normocephalic and atraumatic.  Right Ear: External ear normal.  Left Ear: External ear  normal.  Mouth/Throat: No trismus in the jaw. Dental caries present. No dental abscesses or uvula swelling. No oropharyngeal exudate.  no facial swelling, no submandibular or cervical lymphadenopathy, no facial erythema   Eyes: Conjunctivae and EOM are normal. Pupils are equal, round, and reactive to light. Right eye exhibits no discharge. Left eye exhibits no discharge. Right conjunctiva is not injected. Left conjunctiva is not injected. No  scleral icterus.  Neck: Neck supple. No tracheal deviation present.  Cardiovascular: Normal rate.   Pulmonary/Chest: Effort normal. No stridor. No respiratory distress.  Musculoskeletal: She exhibits no edema.  Neurological: She is alert. Cranial nerve deficit: no gross deficits.  Skin: Skin is warm and dry. No rash noted.  Psychiatric: She has a normal mood and affect.  Nursing note and vitals reviewed.   ED Course  Procedures (including critical care time) Labs Review Labs Reviewed - No data to display    MDM   Final diagnoses:  Dental caries    Pt has dental caries on exam.  No facial swelling noted.  No signs of systemic infection.   Will rx clindamycin.  Pain meds given in the ED.  Follow up with a dentist asap.  Referral given   Dorie Rank, MD 06/10/15 [phone]

## 2015-06-10 NOTE — ED Notes (Signed)
Pt arrived via POV c/o lower right side dental pain started yesterday.

## 2015-06-21 IMAGING — US US ABDOMEN COMPLETE
1 series · 13 of 25 positions shown · non-contrast
Comparison: 03/29/2012

CLINICAL DATA: RIGHT upper quadrant pain, pain all over body for 1
day, remote cholecystectomy history diabetes, coronary artery
disease post MI, GERD, smoking

EXAM:
ULTRASOUND ABDOMEN COMPLETE

[Series 1: us abdomen complete · 0.27mm/px · 13 of 28 slices shown]
[im 1/28]
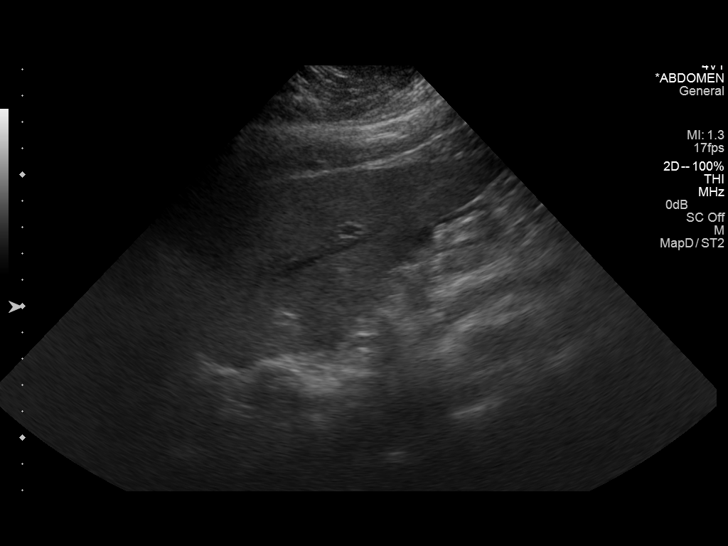
[im 3/28]
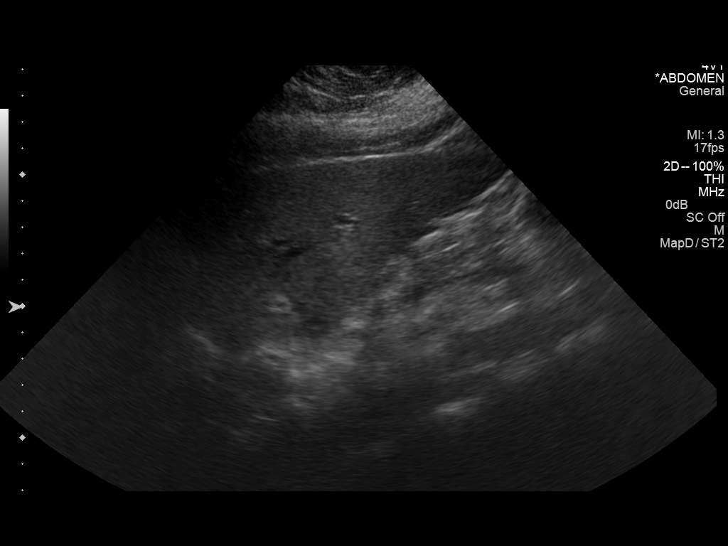
[im 5/28]
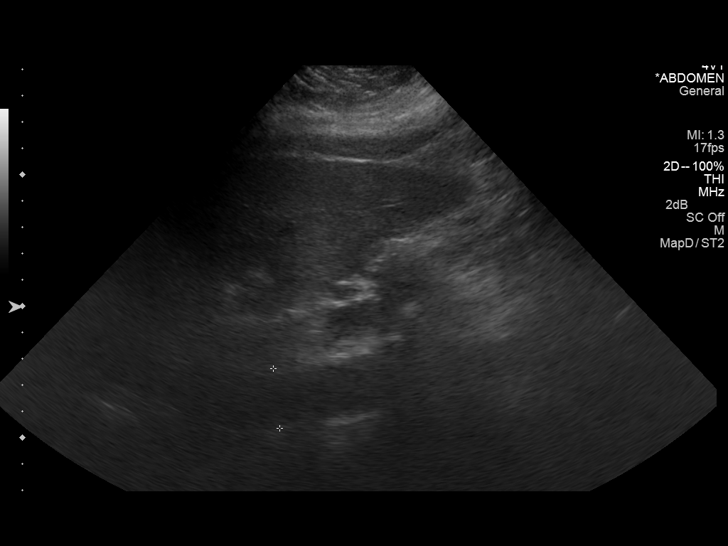
[im 7/28]
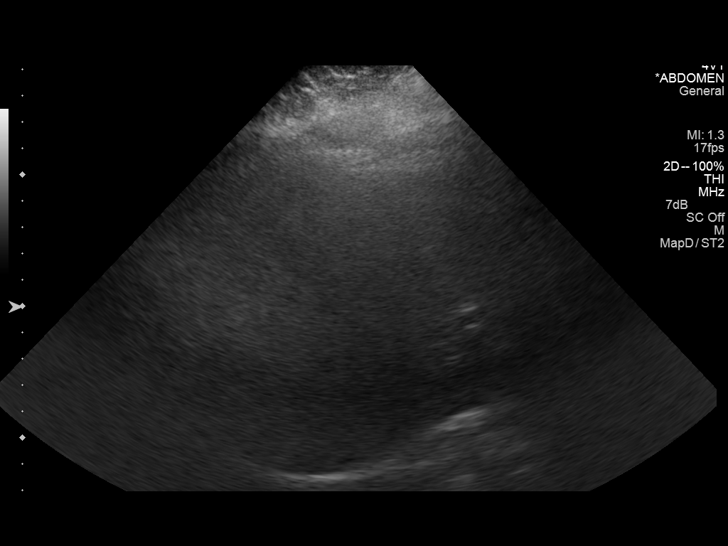
[im 10/28]
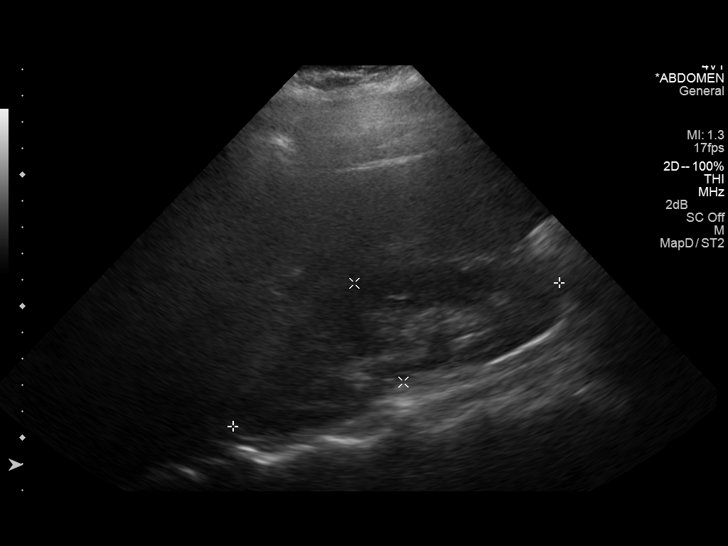
[im 12/28]
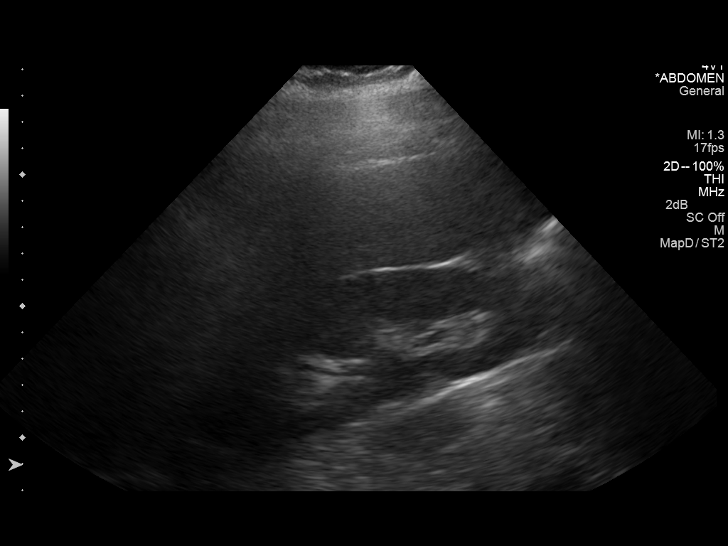
[im 14/28]
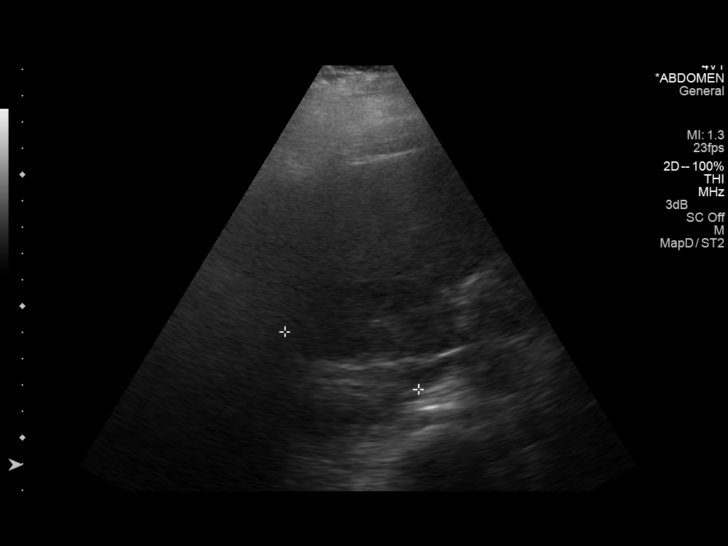
[im 16/28]
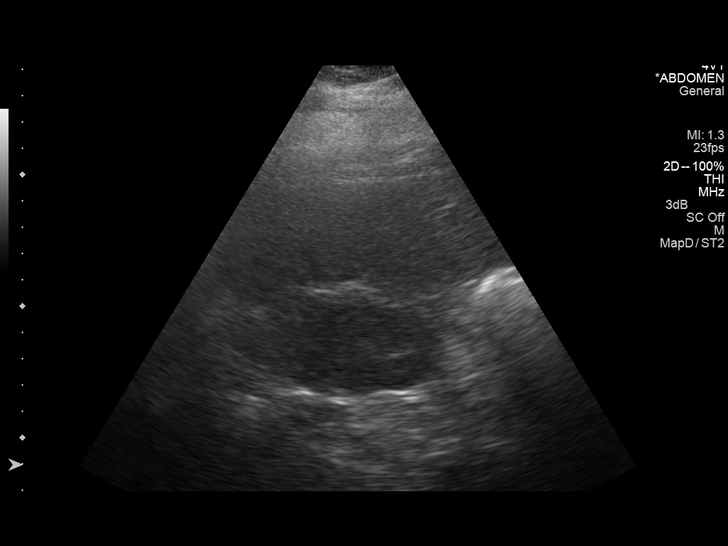
[im 19/28]
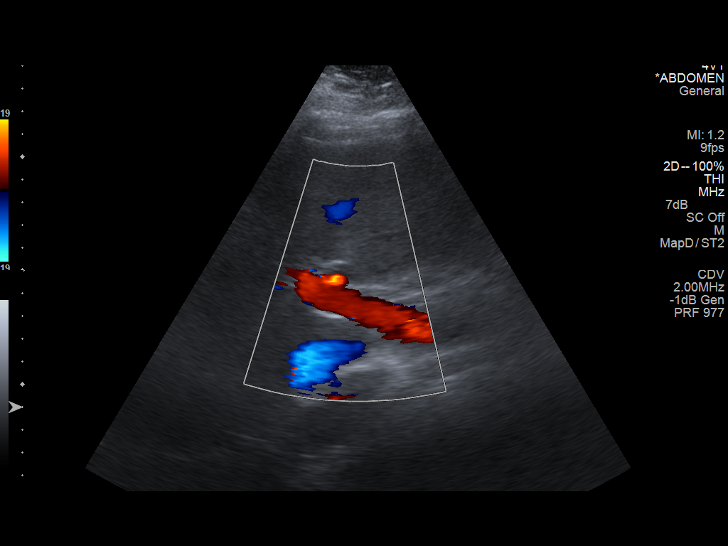
[im 21/28]
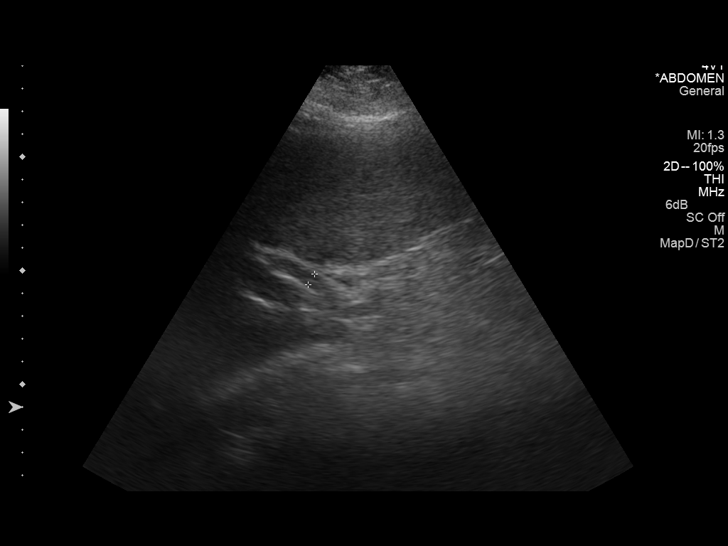
[im 23/28]
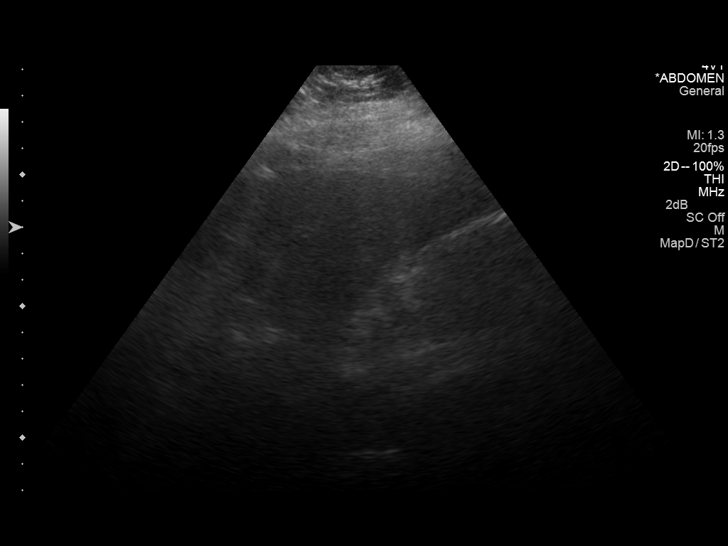
[im 25/28]
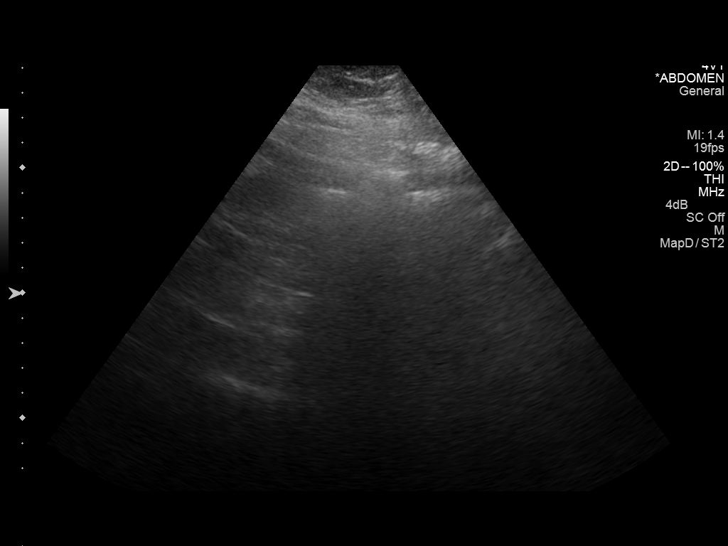
[im 28/28]
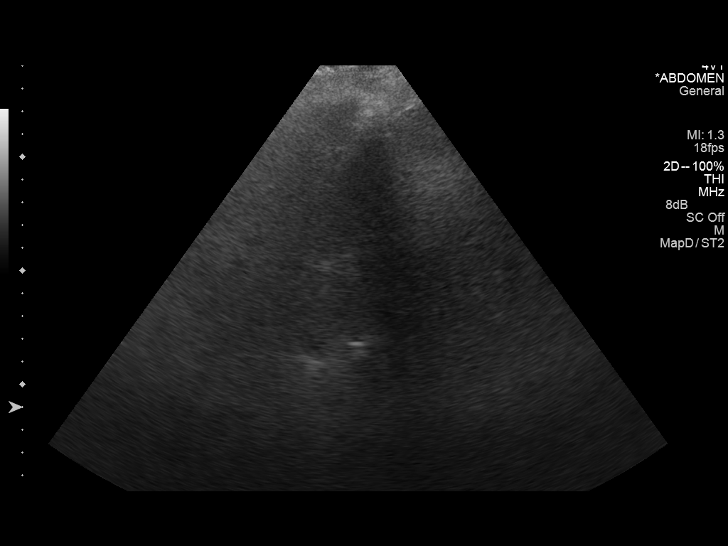

[13 of 25 positions shown; findings below may reference images not displayed]

FINDINGS: Gallbladder: Surgically absent

Common bile duct: Diameter: 6 mm diameter, normal

Liver: Suboptimally visualized due to bowel gas and body habitus.
Mildly echogenic, question mild fatty infiltration though this can
be seen with cirrhosis and certain infiltrative disorders.
Hepatopetal portal venous flow.

IVC: Normal appearance

Pancreas: Obscured by bowel gas

Spleen: Not visualized, question related to bowel gas versus high
subcostal position for body habitus.

Right Kidney: Length: 13.5 cm. Normal morphology without mass or
hydronephrosis.

Left Kidney: None visualized question related to body habitus and
bowel gas

Abdominal aorta: Proximal and mid abdominal aorta normal caliber,
remainder obscured by bowel gas

Other findings: No free-fluid
IMPRESSION: Severely limited exam.

Post cholecystectomy.

Question minimal fatty infiltration of liver.

Inadequate visualization of liver, pancreas, spleen and LEFT kidney.

Recommend further assessment by CT imaging with IV and oral contrast
adequately assess the abdominal cavity.

## 2015-06-21 IMAGING — CR DG CHEST 1V PORT
1 series · 1 of 1 positions shown · non-contrast
Comparison: PA and lateral chest 09/14/2014 and 07/30/2014.

CLINICAL DATA: Chest pain beginning this morning.

EXAM:
PORTABLE CHEST - 1 VIEW

[portable]
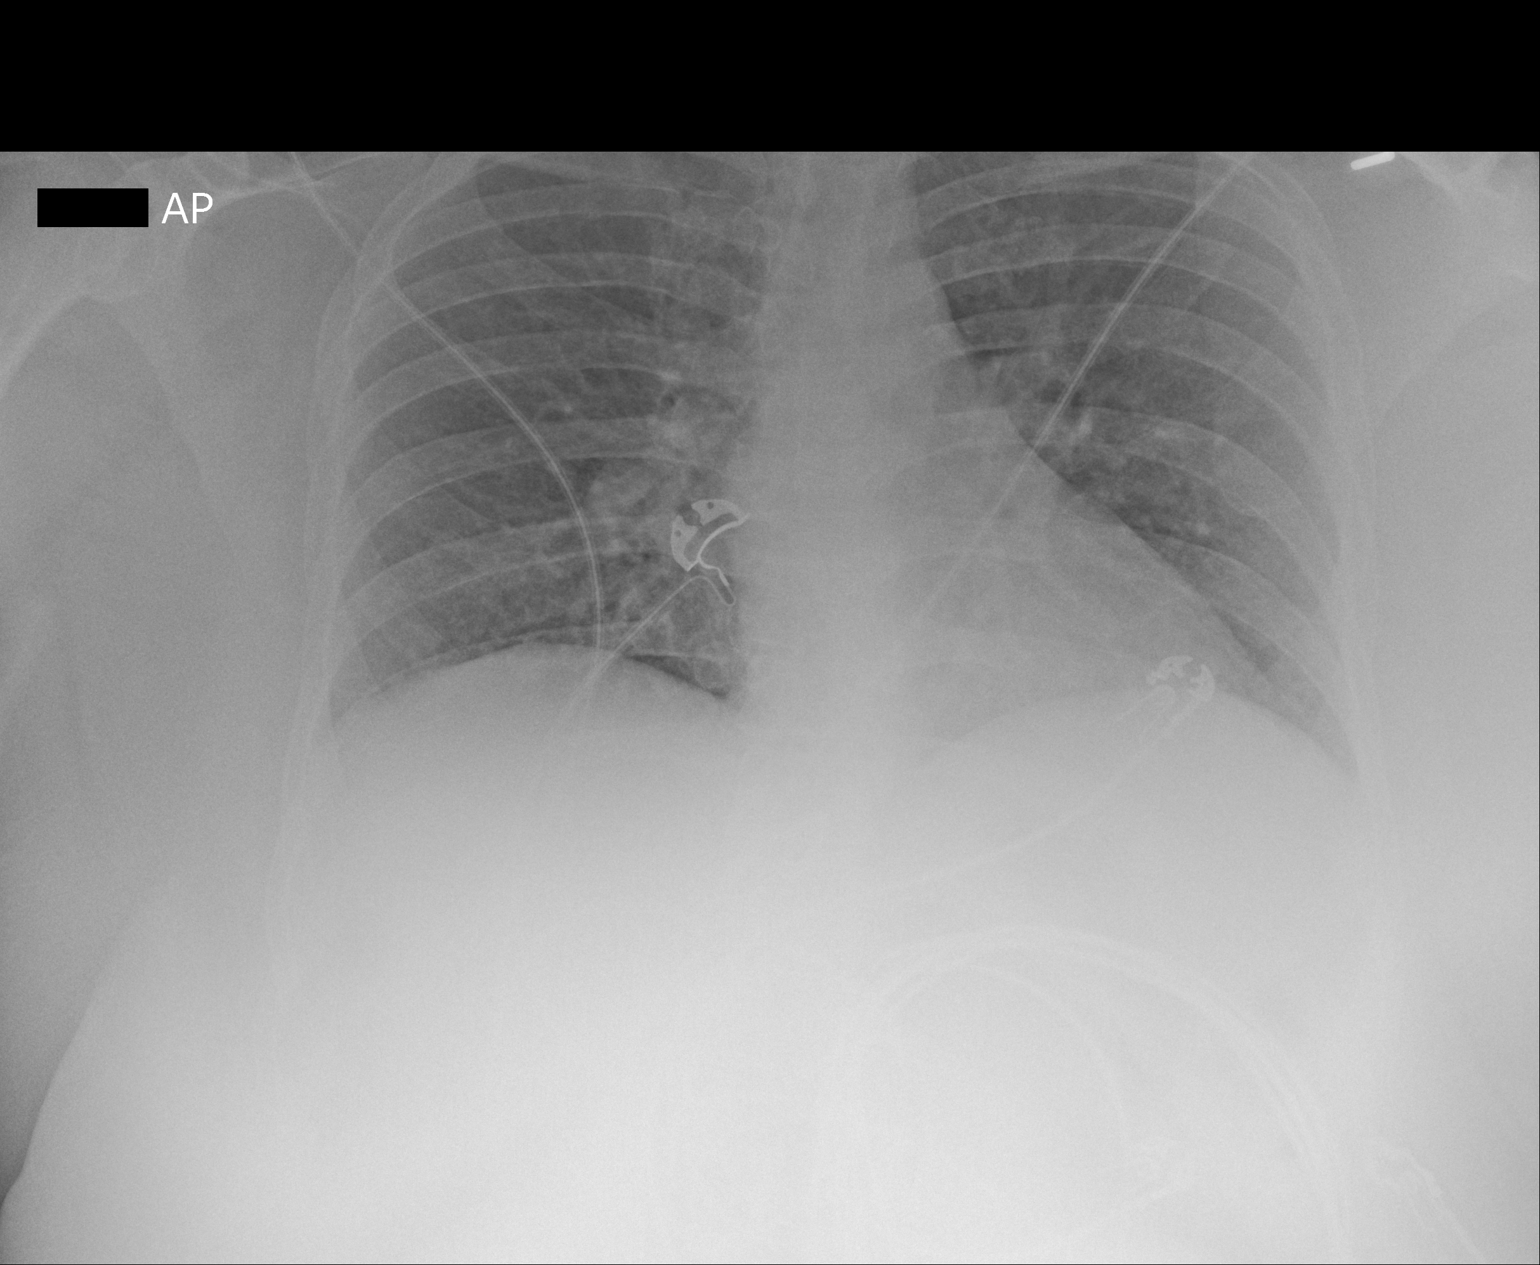

[1 of 1 positions shown; findings below may reference images not displayed]

FINDINGS: The lungs are clear. Heart size is normal. There is no pneumothorax
or pleural effusion. No focal bony abnormality.
IMPRESSION: Negative chest.

## 2015-08-18 ENCOUNTER — Emergency Department (HOSPITAL_COMMUNITY)
Admission: EM | Admit: 2015-08-18 | Discharge: 2015-08-18 | Disposition: A | Discharge status: Home / Self Care | Payer: BLUE CROSS/BLUE SHIELD | Attending: Emergency Medicine | Admitting: Emergency Medicine

## 2015-08-18 ENCOUNTER — Encounter (HOSPITAL_COMMUNITY): Payer: Self-pay

## 2015-08-18 DIAGNOSIS — E669 Obesity, unspecified: Secondary | ICD-10-CM | POA: Diagnosis not present

## 2015-08-18 DIAGNOSIS — Z7982 Long term (current) use of aspirin: Secondary | ICD-10-CM | POA: Insufficient documentation

## 2015-08-18 DIAGNOSIS — Z7984 Long term (current) use of oral hypoglycemic drugs: Secondary | ICD-10-CM | POA: Insufficient documentation

## 2015-08-18 DIAGNOSIS — Z792 Long term (current) use of antibiotics: Secondary | ICD-10-CM | POA: Diagnosis not present

## 2015-08-18 DIAGNOSIS — I25119 Atherosclerotic heart disease of native coronary artery with unspecified angina pectoris: Secondary | ICD-10-CM | POA: Diagnosis not present

## 2015-08-18 DIAGNOSIS — F17210 Nicotine dependence, cigarettes, uncomplicated: Secondary | ICD-10-CM | POA: Insufficient documentation

## 2015-08-18 DIAGNOSIS — E1165 Type 2 diabetes mellitus with hyperglycemia: Secondary | ICD-10-CM | POA: Insufficient documentation

## 2015-08-18 DIAGNOSIS — E785 Hyperlipidemia, unspecified: Secondary | ICD-10-CM | POA: Diagnosis not present

## 2015-08-18 DIAGNOSIS — F419 Anxiety disorder, unspecified: Secondary | ICD-10-CM | POA: Insufficient documentation

## 2015-08-18 DIAGNOSIS — F329 Major depressive disorder, single episode, unspecified: Secondary | ICD-10-CM | POA: Insufficient documentation

## 2015-08-18 DIAGNOSIS — Z3202 Encounter for pregnancy test, result negative: Secondary | ICD-10-CM | POA: Diagnosis not present

## 2015-08-18 DIAGNOSIS — Z79899 Other long term (current) drug therapy: Secondary | ICD-10-CM | POA: Diagnosis not present

## 2015-08-18 DIAGNOSIS — N938 Other specified abnormal uterine and vaginal bleeding: Secondary | ICD-10-CM | POA: Diagnosis not present

## 2015-08-18 DIAGNOSIS — I509 Heart failure, unspecified: Secondary | ICD-10-CM | POA: Insufficient documentation

## 2015-08-18 DIAGNOSIS — Z7902 Long term (current) use of antithrombotics/antiplatelets: Secondary | ICD-10-CM | POA: Insufficient documentation

## 2015-08-18 DIAGNOSIS — I252 Old myocardial infarction: Secondary | ICD-10-CM | POA: Insufficient documentation

## 2015-08-18 DIAGNOSIS — M1990 Unspecified osteoarthritis, unspecified site: Secondary | ICD-10-CM | POA: Diagnosis not present

## 2015-08-18 DIAGNOSIS — J45909 Unspecified asthma, uncomplicated: Secondary | ICD-10-CM | POA: Insufficient documentation

## 2015-08-18 DIAGNOSIS — R000 Tachycardia, unspecified: Secondary | ICD-10-CM | POA: Insufficient documentation

## 2015-08-18 DIAGNOSIS — Z8719 Personal history of other diseases of the digestive system: Secondary | ICD-10-CM | POA: Diagnosis not present

## 2015-08-18 DIAGNOSIS — Z794 Long term (current) use of insulin: Secondary | ICD-10-CM | POA: Diagnosis not present

## 2015-08-18 DIAGNOSIS — Z880 Allergy status to penicillin: Secondary | ICD-10-CM | POA: Diagnosis not present

## 2015-08-18 DIAGNOSIS — R739 Hyperglycemia, unspecified: Secondary | ICD-10-CM

## 2015-08-18 LAB — CBC WITH DIFFERENTIAL/PLATELET
BASOS ABS: 0 10*3/uL (ref 0.0–0.1)
Basophils Relative: 0 %
Eosinophils Absolute: 0.1 10*3/uL (ref 0.0–0.7)
Eosinophils Relative: 2 %
HEMATOCRIT: 42 % (ref 36.0–46.0)
Hemoglobin: 14.5 g/dL (ref 12.0–15.0)
LYMPHS PCT: 33 %
Lymphs Abs: 2.7 10*3/uL (ref 0.7–4.0)
MCH: 27.8 pg (ref 26.0–34.0)
MCHC: 34.5 g/dL (ref 30.0–36.0)
MCV: 80.6 fL (ref 78.0–100.0)
Monocytes Absolute: 0.5 10*3/uL (ref 0.1–1.0)
Monocytes Relative: 6 %
NEUTROS ABS: 4.9 10*3/uL (ref 1.7–7.7)
NEUTROS PCT: 59 %
Platelets: 308 10*3/uL (ref 150–400)
RBC: 5.21 MIL/uL — AB (ref 3.87–5.11)
RDW: 13.7 % (ref 11.5–15.5)
WBC: 8.2 10*3/uL (ref 4.0–10.5)

## 2015-08-18 LAB — I-STAT CHEM 8, ED
BUN: 5 mg/dL — AB (ref 6–20)
Calcium, Ion: 1.1 mmol/L — ABNORMAL LOW (ref 1.12–1.23)
Chloride: 99 mmol/L — ABNORMAL LOW (ref 101–111)
Creatinine, Ser: 0.4 mg/dL — ABNORMAL LOW (ref 0.44–1.00)
Glucose, Bld: 571 mg/dL (ref 65–99)
HEMATOCRIT: 46 % (ref 36.0–46.0)
HEMOGLOBIN: 15.6 g/dL — AB (ref 12.0–15.0)
Potassium: 3.9 mmol/L (ref 3.5–5.1)
Sodium: 136 mmol/L (ref 135–145)
TCO2: 24 mmol/L (ref 0–100)

## 2015-08-18 LAB — I-STAT BETA HCG BLOOD, ED (MC, WL, AP ONLY): I-stat hCG, quantitative: <5 m[IU]/mL (ref <5)

## 2015-08-18 MED ORDER — ESTROGENS CONJUGATED 1.25 MG PO TABS: 1.2500 mg | ORAL_TABLET | Freq: Every day | ORAL | Status: DC

## 2015-08-18 NOTE — ED Provider Notes (Addendum)
CSN: [id number]     Arrival date & time 08/18/15  2149 History   First MD Initiated Contact with Patient 08/18/15 2202     Chief Complaint  Patient presents with  . Vaginal Bleeding     (Consider location/radiation/quality/duration/timing/severity/associated sxs/prior Treatment) Patient is a 39 y.o. female presenting with vaginal bleeding. The history is provided by the patient.  Vaginal Bleeding Quality:  Bright red, clots and heavier than menses Severity:  Severe Duration:  2 weeks Timing:  Constant Progression:  Waxing and waning Chronicity:  New Menstrual history:  Regular Number of pads used:  His heaviest she was using super absorbent pads and tampons approximately 3-4 a day now she is only going through 1 pad a day but has significant bleeding and clotting when urinating. Possible pregnancy: no   Context: after urination and spontaneously   Relieved by:  None tried Worsened by:  Nothing tried Ineffective treatments:  None tried Associated symptoms: abdominal pain   Associated symptoms: no dyspareunia, no dysuria, no fever, no nausea and no vaginal discharge   Associated symptoms comment:  Today patient was standing doing her cousin's hair when she became lightheaded and felt generally weak. Risk factors: unprotected sex   Risk factors: no new sexual partner, no ovarian cysts and no STD exposure   Risk factors comment:  Tubal ligation. No prior history of heavy vaginal bleeding.   Past Medical History  Diagnosis Date  . Diabetes mellitus     2002; no meds since 2004  . Acute myocardial infarction of other anterior wall, initial episode of care   . Paroxysmal ventricular tachycardia ([place])   . Obesity   . Dyslipidemia, goal LDL below 70 10/26/2013  . STEMI (ST elevation myocardial infarction) ([place])   . CAD (coronary artery disease)   . Arthritis   . Dysrhythmia   . Anginal pain ([place])   . Asthma   . Shortness of breath   . Depression   . Anxiety   . GERD  (gastroesophageal reflux disease)   . Headache(784.0)   . Tobacco abuse   . CHF (congestive heart failure) [facility])    Past Surgical History  Procedure Laterality Date  . Cesarean section    . Gallbladder surgery    . Tubal ligation      2000  . Cesarean section  [id number]  . Coronary angioplasty with stent placement    . Left heart cath N/A 10/24/2013    Procedure: LEFT HEART CATH;  Surgeon: Lorretta Harp, MD;  Location: [facility] CATH LAB;  Service: Cardiovascular;  Laterality: N/A;  . Left heart catheterization with coronary angiogram N/A 10/26/2013    Procedure: LEFT HEART CATHETERIZATION WITH CORONARY ANGIOGRAM;  Surgeon: Burnell Blanks, MD;  Location: [facility] CATH LAB;  Service: Cardiovascular;  Laterality: N/A;  . Left heart catheterization with coronary angiogram N/A 03/29/2014    Procedure: LEFT HEART CATHETERIZATION WITH CORONARY ANGIOGRAM;  Surgeon: Lorretta Harp, MD;  Location: [facility] CATH LAB;  Service: Cardiovascular;  Laterality: N/A;  . Cholecystectomy     Family History  Problem Relation Age of Onset  . Arthritis Mother   . Diabetes Mother   . Heart disease Father   . Stroke Father   . Cancer Neg Hx   . Alcohol abuse Neg Hx   . Early death Neg Hx   . Kidney disease Neg Hx   . Hypertension Neg Hx   . Hyperlipidemia Neg Hx   . Diabetes Maternal Grandfather    Social History  Substance Use Topics  . Smoking status: Current Every Day Smoker -- 0.50 packs/day for 20 years    Types: Cigarettes  . Smokeless tobacco: Never Used  . Alcohol Use: No     Comment: occasional   OB History    Gravida Para Term Preterm AB TAB SAB Ectopic Multiple Living   4 3 3  1  1   3      Review of Systems  Constitutional: Negative for fever.  Gastrointestinal: Positive for abdominal pain. Negative for nausea.  Endocrine:       Patient states she knows her blood sugar is elevated because she has been working a lot and has not been eating her normal diabetic diet. She states 3 days  ago she checked it it was 2:15 but she hasn't checked it since. She is still taking her medication. She has had increased thirst and urination.  Genitourinary: Positive for vaginal bleeding. Negative for dysuria, vaginal discharge and dyspareunia.  All other systems reviewed and are negative.     Allergies  Erythromycin; Penicillins; Metformin and related; and Viibryd  Home Medications   Prior to Admission medications   Medication Sig Start Date End Date Taking? Authorizing Provider  ALPRAZolam Duanne Moron) 0.5 MG tablet TAKE ONE TABLET BY MOUTH AT BEDTIME AS NEEDED FOR ANXIETY 01/02/15   [name], MD  aspirin EC 81 MG tablet Take 81 mg by mouth daily.    Historical Provider, MD  atorvastatin (LIPITOR) 80 MG tablet Take 1 tablet (80 mg total) by mouth daily. 01/03/15   [name], MD  Biotin 1000 MCG tablet Take 1,000 mcg by mouth 2 (two) times daily.    Historical Provider, MD  canagliflozin (INVOKANA) 300 MG TABS tablet Take 300 mg by mouth daily. 01/03/15   [name], MD  clindamycin (CLEOCIN) 300 MG capsule Take 1 capsule (300 mg total) by mouth 3 (three) times daily. 06/10/15   Dorie Rank, MD  GARLIC PO Take 1 tablet by mouth daily.    Historical Provider, MD  HYDROcodone-acetaminophen (NORCO) 10-325 MG per tablet Take 1 tablet by mouth every 8 (eight) hours as needed. 01/02/15   [name], MD  ibuprofen (ADVIL,MOTRIN) 800 MG tablet Take 1 tablet (800 mg total) by mouth every 8 (eight) hours as needed for mild pain. 01/16/15   Kristen N Ward, DO  Insulin Glargine (TOUJEO SOLOSTAR) 300 UNIT/ML SOPN Inject 50 Units into the skin daily. 06/14/14   [name], MD  Insulin Pen Needle 32G X 5 MM MISC Use pen needle with victoza insulin injector as directed by your physician. 01/02/15   [name], MD  Liraglutide (VICTOZA) 18 MG/3ML SOPN Inject 0.3 mLs (1.8 mg total) into the skin daily. 01/02/15   [name], MD  Menthol, Topical Analgesic, (BIOFREEZE EX) Apply 1  application topically daily as needed (body pain).    Historical Provider, MD  Multiple Vitamins-Minerals (MULTIVITAMIN PO) Take 1 tablet by mouth daily.    Historical Provider, MD  nitroGLYCERIN (NITROSTAT) 0.4 MG SL tablet Place 0.4 mg under the tongue every 5 (five) minutes as needed for chest pain.    Historical Provider, MD  Omega-3 Fatty Acids (FISH OIL PO) Take 1 tablet by mouth daily.    Historical Provider, MD  potassium chloride SA (K-DUR,KLOR-CON) 20 MEQ tablet Take 1 tablet (20 mEq total) by mouth daily. 01/03/15   [name], MD  ticagrelor (BRILINTA) 90 MG TABS tablet Take 1 tablet (90 mg  total) by mouth 2 (two) times daily. 04/22/15   Lorretta Harp, MD  traMADol (ULTRAM) 50 MG tablet Take 1 tablet (50 mg total) by mouth every 6 (six) hours as needed. 06/10/15   Dorie Rank, MD   BP 143/81 mmHg  Pulse 105  Temp(Src) 97.7 F (36.5 C) (Oral)  Resp 18  Ht 5\' 5"  (1.651 m)  Wt 234 lb 12.8 oz (106.505 kg)  BMI 39.07 kg/m2  SpO2 96%  LMP 08/02/2015 Physical Exam  Constitutional: She is oriented to person, place, and time. She appears well-developed and well-nourished. No distress.  HENT:  Head: Normocephalic and atraumatic.  Mouth/Throat: Oropharynx is clear and moist.  Eyes: Conjunctivae and EOM are normal. Pupils are equal, round, and reactive to light.  Neck: Normal range of motion. Neck supple.  Cardiovascular: Regular rhythm and intact distal pulses.  Tachycardia present.   No murmur heard. Pulmonary/Chest: Effort normal and breath sounds normal. No respiratory distress. She has no wheezes. She has no rales.  Abdominal: Soft. She exhibits no distension. There is no tenderness. There is no rebound and no guarding.  Genitourinary: Cervix exhibits no motion tenderness. Right adnexum displays no mass, no tenderness and no fullness. Left adnexum displays no mass, no tenderness and no fullness. No vaginal discharge found.  Minimal blood in the vaginal vault.  SMall clot at the  cervical os.  Musculoskeletal: Normal range of motion. She exhibits no edema or tenderness.  Neurological: She is alert and oriented to person, place, and time.  Skin: Skin is warm and dry. No rash noted. No erythema. No pallor.  Psychiatric: She has a normal mood and affect. Her behavior is normal.  Nursing note and vitals reviewed.   ED Course  Procedures (including critical care time) Labs Review Labs Reviewed  CBC WITH DIFFERENTIAL/PLATELET - Abnormal; Notable for the following:    RBC 5.21 (*)    All other components within normal limits  I-STAT CHEM 8, ED - Abnormal; Notable for the following:    Chloride 99 (*)    BUN 5 (*)    Creatinine, Ser 0.40 (*)    Glucose, Bld 571 (*)    Calcium, Ion 1.10 (*)    Hemoglobin 15.6 (*)    All other components within normal limits  I-STAT BETA HCG BLOOD, ED ([facility], [facility], [facility] ONLY)    Imaging Review No results found. I have personally reviewed and evaluated these images and lab results as part of my medical decision-making.   EKG Interpretation None      MDM   Final diagnoses:  DUB (dysfunctional uterine bleeding)  Hyperglycemia    Patient is a 39 year old female with a history of diabetes, congestive heart failure, STEMI and coronary artery disease he is currently on Brilinta presenting with 2 weeks of vaginal bleeding. The bladder vaginal bleeding has improved but still having clots and feeling generally weak. She denies any new vaginal discharge other than blood itching or burning. She is urinating normally. She states her blood sugar has been elevated a lot and has not been following a diabetic diet. She denies any nausea or vomiting is only complaining of suprapubic cramping. She has never had issues with her periods before despite being on blood thinner. However today she has felt generally weak and had a near syncopal event so came in for further evaluation.  On exam patient is mildly tachycardic but otherwise well-appearing.  Blood pressure is within normal limits. She has no reproducible abdominal pain. Feel most likely  patient has dysfunctional uterine bleeding. She has had a tubal ligation and denies any hormone use.  Pelvic exam without significant findings other than a minimal amount of blood and a small clot at the cervical os. Patient's blood sugar is 571 today however she states she has not used her insulin and she ate prior to coming. She has no signs of DKA and at this time she will take her medication when she gets home and follow a more strict diabetic diet. Recommended that patient follow-up with OB/GYN for further issues. She was given a prescription for estrogen to start if the bleeding becomes heavy but if not to not take it.    Blanchie Dessert, MD 08/18/15 [id number]  Blanchie Dessert, MD 08/18/15 [phone]

## 2015-08-18 NOTE — ED Notes (Signed)
Pt stable, ambulatory, states understanding of discharge instructions 

## 2015-08-18 NOTE — ED Notes (Signed)
Vaginal bleeding since 08-02-15, small amount leaking onto pad, but when wiping blood and clots will come out.  Pt weak.

## 2015-08-18 NOTE — Discharge Instructions (Signed)

## 2015-08-20 ENCOUNTER — Telehealth: Payer: Self-pay | Admitting: Internal Medicine

## 2015-08-20 NOTE — Telephone Encounter (Signed)
Pt is wondering if you have more samples of the Toujeo insulin pens. She states [facility] will be requesting refills for Liraglutide ([place]) 18 MG/3ML SOPN [id number] Please inform daughter when samples are ready at [phone]

## 2015-08-21 NOTE — Telephone Encounter (Signed)
Called pt no answer LMOM with md response.../lmb 

## 2015-08-21 NOTE — Telephone Encounter (Signed)
She needs to be seen. She is overdue for an office visit with an A1c test. I can give her Toujeo samples when I see her.

## 2015-08-26 ENCOUNTER — Ambulatory Visit (INDEPENDENT_AMBULATORY_CARE_PROVIDER_SITE_OTHER): Payer: BLUE CROSS/BLUE SHIELD | Admitting: Internal Medicine

## 2015-08-26 ENCOUNTER — Other Ambulatory Visit (INDEPENDENT_AMBULATORY_CARE_PROVIDER_SITE_OTHER): Payer: BLUE CROSS/BLUE SHIELD

## 2015-08-26 ENCOUNTER — Other Ambulatory Visit: Payer: Self-pay | Admitting: Cardiovascular Disease

## 2015-08-26 ENCOUNTER — Encounter: Payer: Self-pay | Admitting: Internal Medicine

## 2015-08-26 VITALS — BP 122/82 | HR 93 | Temp 98.0°F | Resp 16 | Ht 65.0 in | Wt 231.0 lb

## 2015-08-26 DIAGNOSIS — Z794 Long term (current) use of insulin: Secondary | ICD-10-CM

## 2015-08-26 DIAGNOSIS — I70211 Atherosclerosis of native arteries of extremities with intermittent claudication, right leg: Secondary | ICD-10-CM

## 2015-08-26 DIAGNOSIS — I2510 Atherosclerotic heart disease of native coronary artery without angina pectoris: Secondary | ICD-10-CM

## 2015-08-26 DIAGNOSIS — F4322 Adjustment disorder with anxiety: Secondary | ICD-10-CM

## 2015-08-26 DIAGNOSIS — M5136 Other intervertebral disc degeneration, lumbar region: Secondary | ICD-10-CM

## 2015-08-26 DIAGNOSIS — E118 Type 2 diabetes mellitus with unspecified complications: Secondary | ICD-10-CM | POA: Diagnosis not present

## 2015-08-26 DIAGNOSIS — E785 Hyperlipidemia, unspecified: Secondary | ICD-10-CM

## 2015-08-26 DIAGNOSIS — Z23 Encounter for immunization: Secondary | ICD-10-CM | POA: Diagnosis not present

## 2015-08-26 LAB — URINALYSIS, ROUTINE W REFLEX MICROSCOPIC
Bilirubin Urine: NEGATIVE
Ketones, ur: NEGATIVE
Leukocytes, UA: NEGATIVE
Nitrite: NEGATIVE
Specific Gravity, Urine: 1.02 (ref 1.000–1.030)
Total Protein, Urine: NEGATIVE
Urine Glucose: NEGATIVE
Urobilinogen, UA: 0.2 (ref 0.0–1.0)
pH: 5.5 (ref 5.0–8.0)

## 2015-08-26 LAB — HEMOGLOBIN A1C: Hgb A1c MFr Bld: 10.7 % — ABNORMAL HIGH (ref 4.6–6.5)

## 2015-08-26 LAB — MICROALBUMIN / CREATININE URINE RATIO
CREATININE, U: 88.8 mg/dL
Microalb Creat Ratio: 5 mg/g (ref 0.0–30.0)
Microalb, Ur: 4.4 mg/dL — ABNORMAL HIGH (ref 0.0–1.9)

## 2015-08-26 LAB — TSH: TSH: 2.4 u[IU]/mL (ref 0.35–4.50)

## 2015-08-26 MED ORDER — HYDROCODONE-ACETAMINOPHEN 10-325 MG PO TABS: 1.0000 | ORAL_TABLET | Freq: Three times a day (TID) | ORAL | Status: DC | PRN

## 2015-08-26 MED ORDER — ALPRAZOLAM 0.5 MG PO TABS: ORAL_TABLET | ORAL | Status: DC

## 2015-08-26 NOTE — Progress Notes (Signed)
Pre visit review using our clinic review tool, if applicable. No additional management support is needed unless otherwise documented below in the visit note. 

## 2015-08-26 NOTE — Progress Notes (Signed)
Subjective:  Patient ID: Maureen Price, female    DOB: 04/16/1976  Age: 39 y.o. MRN: [id number]  CC: Diabetes and Back Pain   HPI Maureen Price presents for follow-up on diabetes, chronic back pain and anxiety. She has not recently been compliant with Invokana. She is also not recently been giving herself Toujeo because she ran out of it. She has constant aching in her lower back that is worsened by activity. The back pain does not radiate into her lower extremities and she denies numbness, weakness, tingling in her legs.  Outpatient Prescriptions Prior to Visit  Medication Sig Dispense Refill  . aspirin EC 81 MG tablet Take 81 mg by mouth daily.    Marland Kitchen atorvastatin (LIPITOR) 80 MG tablet Take 1 tablet (80 mg total) by mouth daily. 90 tablet 3  . Biotin 1000 MCG tablet Take 1,000 mcg by mouth 2 (two) times daily.    . canagliflozin (INVOKANA) 300 MG TABS tablet Take 300 mg by mouth daily. 90 tablet 3  . Insulin Glargine (TOUJEO SOLOSTAR) 300 UNIT/ML SOPN Inject 50 Units into the skin daily. 1.5 mL 11  . Insulin Pen Needle 32G X 5 MM MISC Use pen needle with victoza insulin injector as directed by your physician. 100 each 3  . Liraglutide (VICTOZA) 18 MG/3ML SOPN Inject 0.3 mLs (1.8 mg total) into the skin daily. 3 mL 11  . Menthol, Topical Analgesic, (BIOFREEZE EX) Apply 1 application topically daily as needed (body pain).    . Multiple Vitamins-Minerals (MULTIVITAMIN PO) Take 1 tablet by mouth daily.    . nitroGLYCERIN (NITROSTAT) 0.4 MG SL tablet Place 0.4 mg under the tongue every 5 (five) minutes as needed for chest pain.    . Omega-3 Fatty Acids (FISH OIL PO) Take 1 tablet by mouth daily.    . potassium chloride SA (K-DUR,KLOR-CON) 20 MEQ tablet Take 1 tablet (20 mEq total) by mouth daily. 30 tablet 3  . ALPRAZolam (XANAX) 0.5 MG tablet TAKE ONE TABLET BY MOUTH AT BEDTIME AS NEEDED FOR ANXIETY (Patient not taking: Reported on 08/26/2015) 60 tablet 2  . clindamycin (CLEOCIN)  300 MG capsule Take 1 capsule (300 mg total) by mouth 3 (three) times daily. 21 capsule 0  . estrogens, conjugated, (PREMARIN) 1.25 MG tablet Take 1 tablet (1.25 mg total) by mouth daily. Only start if vaginal bleeding becomes heavy (Patient not taking: Reported on 08/26/2015) 5 tablet 0  . GARLIC PO Take 1 tablet by mouth daily.    Marland Kitchen HYDROcodone-acetaminophen (NORCO) 10-325 MG per tablet Take 1 tablet by mouth every 8 (eight) hours as needed. 75 tablet 0  . ibuprofen (ADVIL,MOTRIN) 800 MG tablet Take 1 tablet (800 mg total) by mouth every 8 (eight) hours as needed for mild pain. 30 tablet 0  . ticagrelor (BRILINTA) 90 MG TABS tablet Take 1 tablet (90 mg total) by mouth 2 (two) times daily. 32 tablet 0  . traMADol (ULTRAM) 50 MG tablet Take 1 tablet (50 mg total) by mouth every 6 (six) hours as needed. 15 tablet 0   No facility-administered medications prior to visit.    ROS Review of Systems  Constitutional: Negative.  Negative for fever, chills, diaphoresis, appetite change and fatigue.  HENT: Negative.   Eyes: Negative.   Respiratory: Negative.  Negative for cough, choking, chest tightness, shortness of breath and stridor.   Cardiovascular: Negative.  Negative for chest pain, palpitations and leg swelling.  Gastrointestinal: Negative.  Negative for nausea, vomiting, abdominal pain, diarrhea  and constipation.  Endocrine: Negative.  Negative for polydipsia, polyphagia and polyuria.  Genitourinary: Negative.  Negative for dysuria and difficulty urinating.  Musculoskeletal: Positive for back pain. Negative for myalgias, arthralgias and neck pain.  Skin: Negative.  Negative for color change and rash.  Allergic/Immunologic: Negative.   Neurological: Negative.  Negative for dizziness, tremors, weakness and light-headedness.  Hematological: Negative.  Negative for adenopathy. Does not bruise/bleed easily.  Psychiatric/Behavioral: Positive for sleep disturbance. Negative for suicidal ideas,  confusion, self-injury, dysphoric mood, decreased concentration and agitation. The patient is nervous/anxious.     Objective:  BP 122/82 mmHg  Pulse 93  Temp(Src) 98 F (36.7 C) (Oral)  Resp 16  Ht 5\' 5"  (1.651 m)  Wt 231 lb (104.781 kg)  BMI 38.44 kg/m2  SpO2 95%  LMP 08/02/2015  BP Readings from Last 3 Encounters:  08/26/15 122/82  08/18/15 148/89  06/10/15 131/62    Wt Readings from Last 3 Encounters:  08/26/15 231 lb (104.781 kg)  08/18/15 234 lb 12.8 oz (106.505 kg)  06/10/15 235 lb (106.595 kg)    Physical Exam  Constitutional: She is oriented to person, place, and time. No distress.  HENT:  Mouth/Throat: Oropharynx is clear and moist. No oropharyngeal exudate.  Eyes: Conjunctivae are normal. Right eye exhibits no discharge. Left eye exhibits no discharge. No scleral icterus.  Neck: Normal range of motion. Neck supple. No JVD present. No tracheal deviation present. No thyromegaly present.  Cardiovascular: Normal rate, regular rhythm, normal heart sounds and intact distal pulses.  Exam reveals no gallop and no friction rub.   No murmur heard. Pulmonary/Chest: Effort normal and breath sounds normal. No stridor. No respiratory distress. She has no wheezes. She has no rales. She exhibits no tenderness.  Abdominal: Soft. Bowel sounds are normal. She exhibits no distension and no mass. There is no tenderness. There is no rebound and no guarding.  Musculoskeletal: Normal range of motion. She exhibits no edema or tenderness.       Lumbar back: Normal. She exhibits normal range of motion, no tenderness, no bony tenderness, no swelling, no edema, no deformity and no laceration.  Lymphadenopathy:    She has no cervical adenopathy.  Neurological: She is alert and oriented to person, place, and time. She has normal strength. She displays no atrophy, no tremor and normal reflexes. No cranial nerve deficit or sensory deficit. She exhibits normal muscle tone. She displays a negative  Romberg sign. She displays no seizure activity. Coordination and gait normal.  Reflex Scores:      Tricep reflexes are 0 on the right side and 0 on the left side.      Bicep reflexes are 0 on the right side and 0 on the left side.      Brachioradialis reflexes are 0 on the right side and 0 on the left side.      Patellar reflexes are 0 on the right side and 0 on the left side.      Achilles reflexes are 0 on the right side and 0 on the left side. Neg SLR in BLE  Skin: Skin is warm and dry. No rash noted. She is not diaphoretic. No erythema. No pallor.  Vitals reviewed.   Lab Results  Component Value Date   WBC 8.2 08/18/2015   HGB 15.6* 08/18/2015   HCT 46.0 08/18/2015   PLT 308 08/18/2015   GLUCOSE 571* 08/18/2015   CHOL 152 01/02/2015   TRIG 134.0 01/02/2015   HDL 24.30* 01/02/2015  LDLCALC 101* 01/02/2015   ALT 13* 01/21/2015   AST 16 01/21/2015   NA 136 08/18/2015   K 3.9 08/18/2015   CL 99* 08/18/2015   CREATININE 0.40* 08/18/2015   BUN 5* 08/18/2015   CO2 21* 01/21/2015   TSH 1.21 01/02/2015   INR 1.04 01/16/2015   HGBA1C 10.1* 01/02/2015   MICROALBUR 5.6* 01/02/2015    No results found.  Assessment & Plan:   Ziqi was seen today for diabetes and back pain.  Diagnoses and all orders for this visit:  Atherosclerosis of native artery of right lower extremity with intermittent claudication ([place])- she has had no recent episodes of pain, will try to continue modify her risk factors with statins, blood sugar control, and blood pressure control. -     Lipid panel; Future  Coronary artery disease involving native coronary artery of native heart without angina pectoris- she has not recently had any episodes of angina, will continue to modify her risk factors with control of her lipids, blood sugar and blood pressure. -     Lipid panel; Future  Dyslipidemia, goal LDL below 70- she will restart Lipitor -     Lipid panel; Future -     TSH; Future -     Comprehensive  metabolic panel; Future  Controlled type 2 diabetes mellitus with complication, with long-term current use of insulin ([place])- her blood sugars are not well-controlled, she will restart Victoza, Toujeo, and Invokana, -     Lipid panel; Future -     Urinalysis, Routine w reflex microscopic (not at [facility]); Future -     Microalbumin / creatinine urine ratio; Future -     Comprehensive metabolic panel; Future -     Hemoglobin A1c; Future -     Ambulatory referral to Ophthalmology  Encounter for immunization  Adjustment disorder with anxious mood -     ALPRAZolam (XANAX) 0.5 MG tablet; TAKE ONE TABLET BY MOUTH AT BEDTIME AS NEEDED FOR ANXIETY  DDD (degenerative disc disease), lumbar -     Discontinue: HYDROcodone-acetaminophen (NORCO) 10-325 MG tablet; Take 1 tablet by mouth every 8 (eight) hours as needed. -     Discontinue: HYDROcodone-acetaminophen (NORCO) 10-325 MG tablet; Take 1 tablet by mouth every 8 (eight) hours as needed. -     HYDROcodone-acetaminophen (NORCO) 10-325 MG tablet; Take 1 tablet by mouth every 8 (eight) hours as needed.  Other orders -     Flu Vaccine QUAD 36+ mos IM  I have discontinued Ms. Chirino's GARLIC PO, ibuprofen, traMADol, clindamycin, and estrogens (conjugated). I am also having her maintain her aspirin EC, Multiple Vitamins-Minerals (MULTIVITAMIN PO), nitroGLYCERIN, Biotin, Insulin Glargine, (Menthol, Topical Analgesic, (BIOFREEZE EX)), Liraglutide, Insulin Pen Needle, canagliflozin, potassium chloride SA, atorvastatin, Omega-3 Fatty Acids (FISH OIL PO), ALPRAZolam, and HYDROcodone-acetaminophen.  Meds ordered this encounter  Medications  . ALPRAZolam (XANAX) 0.5 MG tablet    Sig: TAKE ONE TABLET BY MOUTH AT BEDTIME AS NEEDED FOR ANXIETY    Dispense:  60 tablet    Refill:  3  . DISCONTD: HYDROcodone-acetaminophen (NORCO) 10-325 MG tablet    Sig: Take 1 tablet by mouth every 8 (eight) hours as needed.    Dispense:  75 tablet    Refill:  0  . DISCONTD:  HYDROcodone-acetaminophen (NORCO) 10-325 MG tablet    Sig: Take 1 tablet by mouth every 8 (eight) hours as needed.    Dispense:  75 tablet    Refill:  0  . HYDROcodone-acetaminophen (NORCO) 10-325 MG tablet  Sig: Take 1 tablet by mouth every 8 (eight) hours as needed.    Dispense:  75 tablet    Refill:  0     Follow-up: Return in about 4 months (around 12/25/2015).  [name], MD

## 2015-08-26 NOTE — Patient Instructions (Signed)

## 2015-08-26 NOTE — Telephone Encounter (Signed)
Patient calling the office for samples of medication: ° ° °1.  What medication and dosage are you requesting samples for? Brilinta  ° °2.  Are you currently out of this medication? Yes  ° ° ° °

## 2015-08-27 ENCOUNTER — Encounter: Payer: Self-pay | Admitting: Internal Medicine

## 2015-08-27 LAB — COMPREHENSIVE METABOLIC PANEL
ALT: 21 U/L (ref 0–35)
AST: 17 U/L (ref 0–37)
Albumin: 3.7 g/dL (ref 3.5–5.2)
Alkaline Phosphatase: 89 U/L (ref 39–117)
BUN: 9 mg/dL (ref 6–23)
CO2: 26 mEq/L (ref 19–32)
Calcium: 9.2 mg/dL (ref 8.4–10.5)
Chloride: 103 mEq/L (ref 96–112)
Creatinine, Ser: 0.52 mg/dL (ref 0.40–1.20)
GFR: 168.57 mL/min (ref >60.00)
GLUCOSE: 182 mg/dL — AB (ref 70–99)
Potassium: 3.8 mEq/L (ref 3.5–5.1)
SODIUM: 137 meq/L (ref 135–145)
Total Bilirubin: 0.3 mg/dL (ref 0.2–1.2)
Total Protein: 6.9 g/dL (ref 6.0–8.3)

## 2015-08-27 LAB — LIPID PANEL
CHOL/HDL RATIO: 7
Cholesterol: 164 mg/dL (ref 0–200)
HDL: 24 mg/dL — ABNORMAL LOW (ref >39.00)
LDL CALC: 119 mg/dL — AB (ref 0–99)
NonHDL: 139.89
TRIGLYCERIDES: 105 mg/dL (ref 0.0–149.0)
VLDL: 21 mg/dL (ref 0.0–40.0)

## 2015-08-27 MED ORDER — TICAGRELOR 90 MG PO TABS: 90.0000 mg | ORAL_TABLET | Freq: Two times a day (BID) | ORAL | Status: DC

## 2015-08-27 NOTE — Telephone Encounter (Signed)
LM samples of Brilinta at the front desk ready for pick up.

## 2015-09-20 ENCOUNTER — Encounter: Payer: Self-pay | Admitting: Obstetrics & Gynecology

## 2015-09-20 ENCOUNTER — Ambulatory Visit (INDEPENDENT_AMBULATORY_CARE_PROVIDER_SITE_OTHER): Payer: BLUE CROSS/BLUE SHIELD | Admitting: Obstetrics & Gynecology

## 2015-09-20 VITALS — BP 145/86 | HR 96 | Temp 98.5°F | Resp 18 | Wt 223.9 lb

## 2015-09-20 DIAGNOSIS — N938 Other specified abnormal uterine and vaginal bleeding: Secondary | ICD-10-CM

## 2015-09-20 LAB — POCT PREGNANCY, URINE: Preg Test, Ur: NEGATIVE

## 2015-09-20 NOTE — Progress Notes (Addendum)
   Subjective:    Patient ID: Maureen Price, female    DOB: 1976-04-08, 40 y.o.   MRN: [id number]  HPI  11 M AAP3 (22, 64, and 51 yo kids) here for ER follow up. She was seen there last month with an abnormal period. Normally her periods are monthly and last up to 6 days. This past month she bled for 3 weeks. Her hbg was 14-15 at the ER. She bled one more week after the ER but not since. She had a BTL.  Review of Systems She had a flu vaccine this season.  She works at [employer] in [place]. Married for 3 years.     Objective:   Physical Exam  [id number] Breathing, conversing,and ambulating normally Abd- benign      Assessment & Plan:  Preventative care- schedule pap/annual Fasting labs at next visit DUB- schedule gyn u/s/ TSH

## 2015-09-20 NOTE — Progress Notes (Signed)
Patient ID: Maureen Price, female   DOB: [birth year], 40 y.o.   MRN: [id number] Pt reports bleeding from [place] [facility] -25th pt reports clots at that time.

## 2015-09-25 ENCOUNTER — Ambulatory Visit (HOSPITAL_COMMUNITY)
Admission: RE | Admit: 2015-09-25 | Discharge: 2015-09-25 | Disposition: A | Discharge status: Home / Self Care | Payer: BLUE CROSS/BLUE SHIELD | Source: Ambulatory Visit | Attending: Obstetrics & Gynecology | Admitting: Obstetrics & Gynecology

## 2015-09-25 DIAGNOSIS — D259 Leiomyoma of uterus, unspecified: Secondary | ICD-10-CM | POA: Diagnosis not present

## 2015-09-25 DIAGNOSIS — N938 Other specified abnormal uterine and vaginal bleeding: Secondary | ICD-10-CM | POA: Insufficient documentation

## 2015-10-01 ENCOUNTER — Encounter: Payer: Self-pay | Admitting: Internal Medicine

## 2015-10-08 ENCOUNTER — Ambulatory Visit (INDEPENDENT_AMBULATORY_CARE_PROVIDER_SITE_OTHER): Payer: BLUE CROSS/BLUE SHIELD | Admitting: Cardiovascular Disease

## 2015-10-08 ENCOUNTER — Encounter: Payer: Self-pay | Admitting: Cardiovascular Disease

## 2015-10-08 DIAGNOSIS — E785 Hyperlipidemia, unspecified: Secondary | ICD-10-CM

## 2015-10-08 DIAGNOSIS — I2584 Coronary atherosclerosis due to calcified coronary lesion: Secondary | ICD-10-CM

## 2015-10-08 DIAGNOSIS — I2510 Atherosclerotic heart disease of native coronary artery without angina pectoris: Secondary | ICD-10-CM

## 2015-10-08 MED ORDER — CLOPIDOGREL BISULFATE 75 MG PO TABS: 75.0000 mg | ORAL_TABLET | Freq: Every day | ORAL | Status: DC

## 2015-10-08 MED ORDER — TICAGRELOR 90 MG PO TABS: 90.0000 mg | ORAL_TABLET | Freq: Two times a day (BID) | ORAL | Status: DC

## 2015-10-08 MED ORDER — COQ10 100 MG PO CAPS: 200.0000 mg | ORAL_CAPSULE | Freq: Every day | ORAL | Status: DC

## 2015-10-08 MED ORDER — ATORVASTATIN CALCIUM 80 MG PO TABS: 80.0000 mg | ORAL_TABLET | Freq: Every day | ORAL | Status: DC

## 2015-10-08 NOTE — Assessment & Plan Note (Signed)
History of hyperlipidemia previously on high-dose atorvastatin which she currently does not take. Her most recent lipid profile performed 08/26/15 revealed a total cholesterol of 164, LDL 119 and HDL 24. Her lipid profile while on Lipitor 18 months previously revealed total cholesterol 126, LDL 76 and HDL of 19. We have talked about restarting Lipitor 80 mg a day and will recheck a lipid and liver profile in 2 months.

## 2015-10-08 NOTE — Assessment & Plan Note (Signed)
History of tobacco abuse continuing to smoke and recalcitrant risk factor modification

## 2015-10-08 NOTE — Progress Notes (Signed)
10/08/2015 Maureen Price   June 08, 1976  [id number]  Primary Physician [name], MD Primary Cardiologist: Lorretta Harp MD Renae Gloss   HPI:  Ms. Wrzesinski is a 40 year old moderately overweight married African American female mother of 3 children he works as a [employer]ar[employer] manager. Her primary care physician is Dr. [name]. I last saw her in the office 06/06/14.She suffered an interval myocardial infarction 10/24/13 treated with direct angioplasty using a drug-eluting stent by myself. Her cardiac risk factor profile is notable for continued tobacco abuse, diabetes and hyperlipidemia. She had 2 admissions for chest pain rule out myocardial infarction in April and June of this year. A Myoview stress test performed 03/06/14 showed severe apical ischemia which led to a cardiac catheterization performed by myself 03/29/14 which was entirely normal. Since I last saw her she has changed jobs and now works at [employer]. She no longer has chest pain which she attributed to stress. She has stopped taking her statin drug with resulting worsening of her lipid profile however.   Current Outpatient Prescriptions  Medication Sig Dispense Refill  . ALPRAZolam (XANAX) 0.5 MG tablet TAKE ONE TABLET BY MOUTH AT BEDTIME AS NEEDED FOR ANXIETY 60 tablet 3  . aspirin EC 81 MG tablet Take 81 mg by mouth daily. Reported on 09/20/2015    . atorvastatin (LIPITOR) 80 MG tablet Take 1 tablet (80 mg total) by mouth daily. 90 tablet 3  . Biotin 1000 MCG tablet Take 1,000 mcg by mouth 2 (two) times daily. Reported on 09/20/2015    . canagliflozin (INVOKANA) 300 MG TABS tablet Take 300 mg by mouth daily. 90 tablet 3  . HYDROcodone-acetaminophen (NORCO) 10-325 MG tablet Take 1 tablet by mouth every 8 (eight) hours as needed. 75 tablet 0  . Insulin Glargine (TOUJEO SOLOSTAR) 300 UNIT/ML SOPN Inject 50 Units into the skin daily. 1.5 mL 11  . Insulin Pen Needle 32G X 5 MM MISC Use pen needle with victoza insulin injector  as directed by your physician. 100 each 3  . Liraglutide (VICTOZA) 18 MG/3ML SOPN Inject 0.3 mLs (1.8 mg total) into the skin daily. 3 mL 11  . Menthol, Topical Analgesic, (BIOFREEZE EX) Apply 1 application topically daily as needed (body pain). Reported on 09/20/2015    . Multiple Vitamins-Minerals (MULTIVITAMIN PO) Take 1 tablet by mouth daily.    . nitroGLYCERIN (NITROSTAT) 0.4 MG SL tablet Place 0.4 mg under the tongue every 5 (five) minutes as needed for chest pain. Reported on 09/20/2015    . Omega-3 Fatty Acids (FISH OIL PO) Take 1 tablet by mouth daily.    . potassium chloride SA (K-DUR,KLOR-CON) 20 MEQ tablet Take 1 tablet (20 mEq total) by mouth daily. 30 tablet 3  . ticagrelor (BRILINTA) 90 MG TABS tablet Take 1 tablet (90 mg total) by mouth 2 (two) times daily. 32 tablet 0   No current facility-administered medications for this visit.    Allergies  Allergen Reactions  . Erythromycin Anaphylaxis  . Penicillins Anaphylaxis  . Metformin And Related Other (See Comments)    diarrhea  . Viibryd [Vilazodone Hcl] Other (See Comments)    Altered mental status.      Social History   Social History  . Marital Status: Married    Spouse Name: N/A  . Number of Children: N/A  . Years of Education: N/A   Occupational History  . Not on file.   Social History Main Topics  . Smoking status: Current Every Day Smoker --  0.50 packs/day for 20 years    Types: Cigarettes  . Smokeless tobacco: Never Used  . Alcohol Use: No     Comment: occasional  . Drug Use: No  . Sexual Activity: Yes    Birth Control/ Protection: Surgical   Other Topics Concern  . Not on file   Social History Narrative   Caffienated drinks- yes   Seat belt use often- yes   Regular Exercise- yes   Smoke alarm in the home- yes   Firearms/guns in the home- no   History of physical abuse- no              Review of Systems: General: negative for chills, fever, night sweats or weight changes.  Cardiovascular:  negative for chest pain, dyspnea on exertion, edema, orthopnea, palpitations, paroxysmal nocturnal dyspnea or shortness of breath Dermatological: negative for rash Respiratory: negative for cough or wheezing Urologic: negative for hematuria Abdominal: negative for nausea, vomiting, diarrhea, bright red blood per rectum, melena, or hematemesis Neurologic: negative for visual changes, syncope, or dizziness All other systems reviewed and are otherwise negative except as noted above.    Blood pressure 126/78, pulse 107, height 5\' 4"  (1.626 m), weight 229 lb (103.874 kg), last menstrual period 08/12/2015.  General appearance: alert and no distress Neck: no adenopathy, no carotid bruit, no JVD, supple, symmetrical, trachea midline and thyroid not enlarged, symmetric, no tenderness/mass/nodules Lungs: clear to auscultation bilaterally Heart: regular rate and rhythm, S1, S2 normal, no murmur, click, rub or gallop Extremities: extremities normal, atraumatic, no cyanosis or edema  EKG sinus tachycardia at 107 with Q waves V1 through the 3 as well as isolated Q in lead 3. I personally reviewed this EKG  ASSESSMENT AND PLAN:   Dyslipidemia, goal LDL below 70 History of hyperlipidemia previously on high-dose atorvastatin which she currently does not take. Her most recent lipid profile performed 08/26/15 revealed a total cholesterol of 164, LDL 119 and HDL 24. Her lipid profile while on Lipitor 18 months previously revealed total cholesterol 126, LDL 76 and HDL of 19. We have talked about restarting Lipitor 80 mg a day and will recheck a lipid and liver profile in 2 months.  STEMI (ST elevation myocardial infarction), PTCA/Stent DES- Xience [place]- LAD, large burden thrombus in LAD and RCA  History of CAD status post inferior STEMI 10/24/13 treated with trochanter plus using a drug-eluting stent. She did have thrombus in her RCA and was recathed several days later revealing a patent stent with resultant  thrombus. She had stress test performed 03/06/14 that showed apical ischemia which led to cardiac catheterization performed by myself 03/29/14 which was entirely normal. Since changing jobs she's had no subsequent chest pain.  Tobacco use disorder History of tobacco abuse continuing to smoke and recalcitrant risk factor modification      Lorretta Harp MD [facility], [facility] 10/08/2015 8:47 AM

## 2015-10-08 NOTE — Patient Instructions (Addendum)
Medication Instructions:  Your physician has recommended you make the following change in your medication:  1) RESTART Lipitor 80 mg by mouth ONCE daily 2) START CoQ10 200 mg by mouth ONCE daily 3) March 2017 - STOP Brilinta and START Plavix 75 mg by mouth ONCE daily   Labwork: Your physician recommends that you return for lab work in: [place] (lipid/liver) - March 2017 when get P2Y12 done  The lab can be found on the FIRST FLOOR of out building in [place] [facility] recommends that you return for lab work in: [facility]  Testing/Procedures: None  Follow-Up: Your physician wants you to follow-up in: 12 months with Dr. Gwenlyn Found. You will receive a reminder letter in the mail two months in advance. If you don't receive a letter, please call our office to schedule the follow-up appointment.   Any Other Special Instructions Will Be Listed Below (If Applicable).     If you need a refill on your cardiac medications before your next appointment, please call your pharmacy.

## 2015-10-08 NOTE — Assessment & Plan Note (Signed)
History of CAD status post inferior STEMI 10/24/13 treated with trochanter plus using a drug-eluting stent. She did have thrombus in her RCA and was recathed several days later revealing a patent stent with resultant thrombus. She had stress test performed 03/06/14 that showed apical ischemia which led to cardiac catheterization performed by myself 03/29/14 which was entirely normal. Since changing jobs she's had no subsequent chest pain.

## 2015-10-23 ENCOUNTER — Ambulatory Visit: Payer: Self-pay | Admitting: Obstetrics & Gynecology

## 2016-03-18 ENCOUNTER — Ambulatory Visit
Admission: RE | Admit: 2016-03-18 | Discharge: 2016-03-18 | Disposition: A | Discharge status: Home / Self Care | Payer: No Typology Code available for payment source | Source: Ambulatory Visit | Attending: Infectious Disease | Admitting: Infectious Disease

## 2016-03-18 ENCOUNTER — Other Ambulatory Visit: Payer: Self-pay | Admitting: Infectious Disease

## 2016-03-18 DIAGNOSIS — R7612 Nonspecific reaction to cell mediated immunity measurement of gamma interferon antigen response without active tuberculosis: Secondary | ICD-10-CM

## 2016-05-17 IMAGING — US US PELVIS COMPLETE
1 series · 15 of 25 positions shown · non-contrast
Comparison: None

CLINICAL DATA: Dysfunctional uterine bleeding for several months.

EXAM:
TRANSABDOMINAL AND TRANSVAGINAL ULTRASOUND OF PELVIS
TECHNIQUE: Both transabdominal and transvaginal ultrasound examinations of the
pelvis were performed. Transabdominal technique was performed for
global imaging of the pelvis including uterus, ovaries, adnexal
regions, and pelvic cul-de-sac. It was necessary to proceed with
endovaginal exam following the transabdominal exam to visualize the
uterus, endometrium, ovaries and adnexa .

[Series 1: us pelvis complete · 15 of 42 slices shown]
[im 1/42]
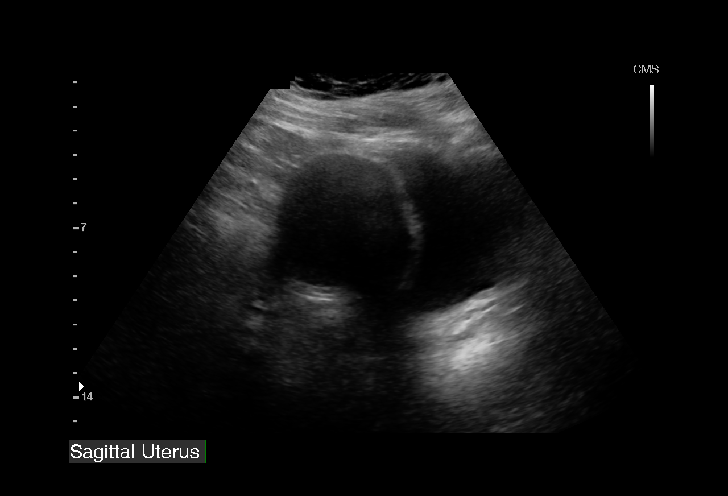
[im 4/42]
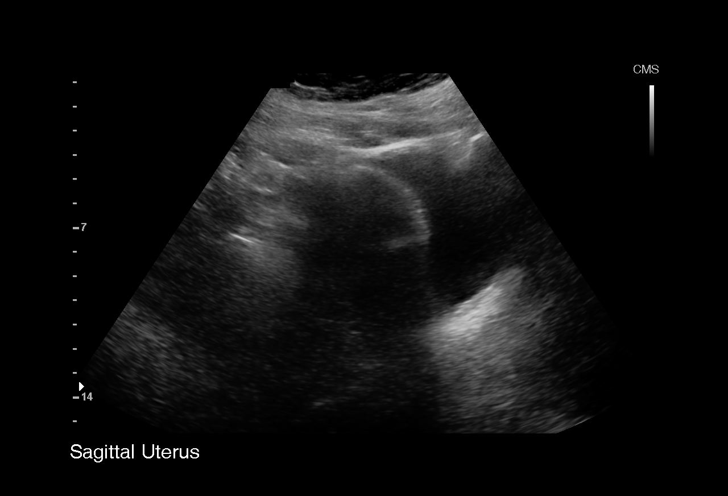
[im 7/42]
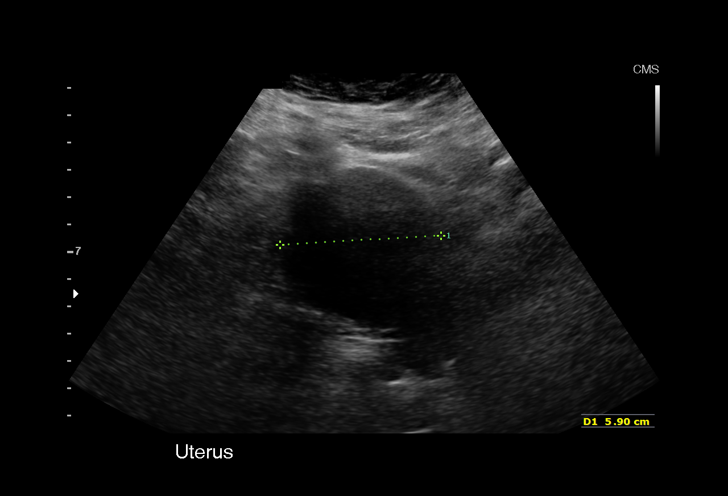
[im 9/42]
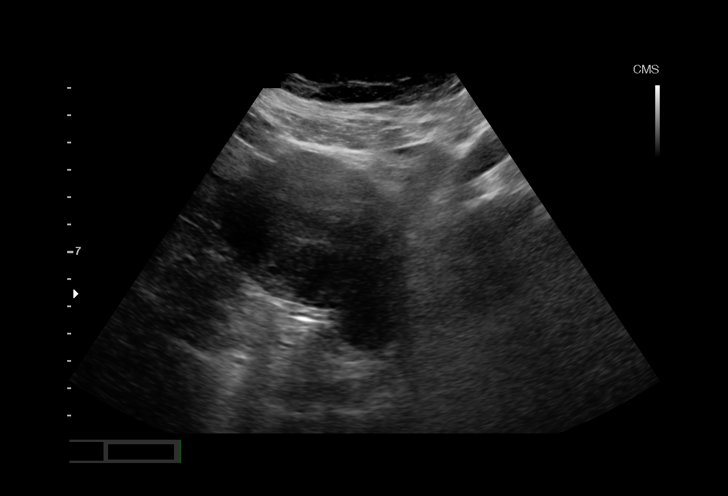
[im 12/42]
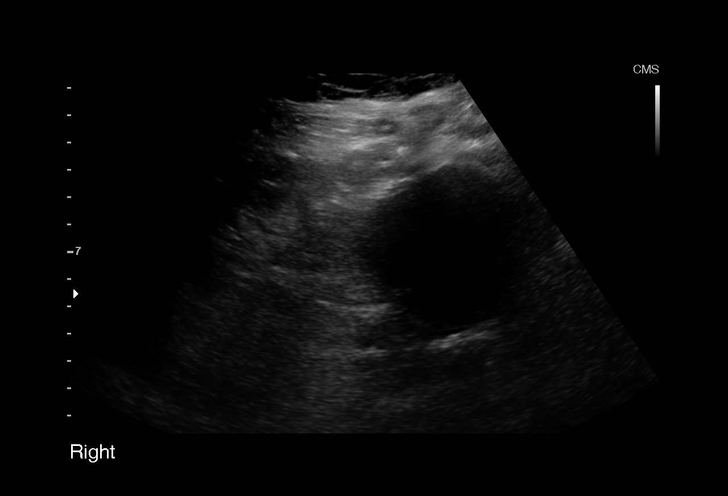
[im 16/42]
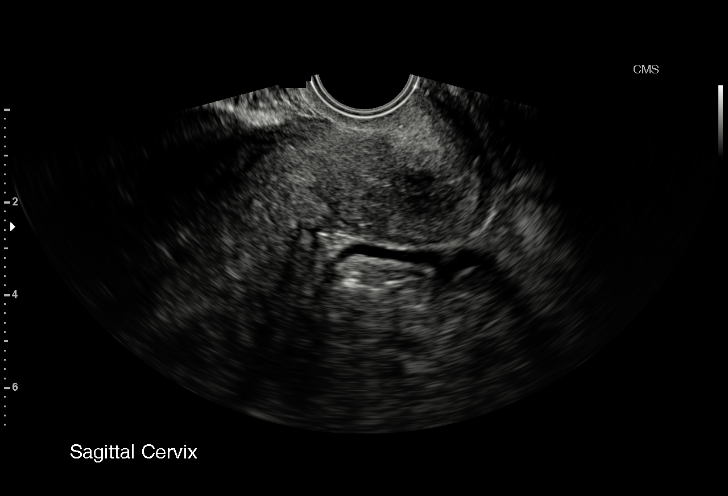
[im 18/42]
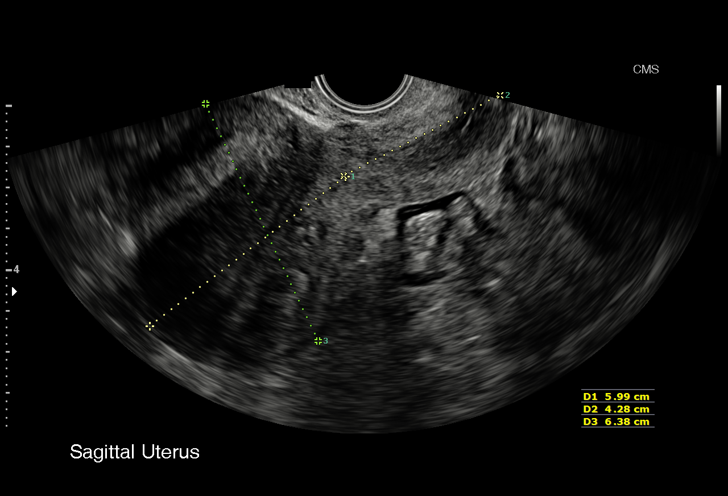
[im 21/42]
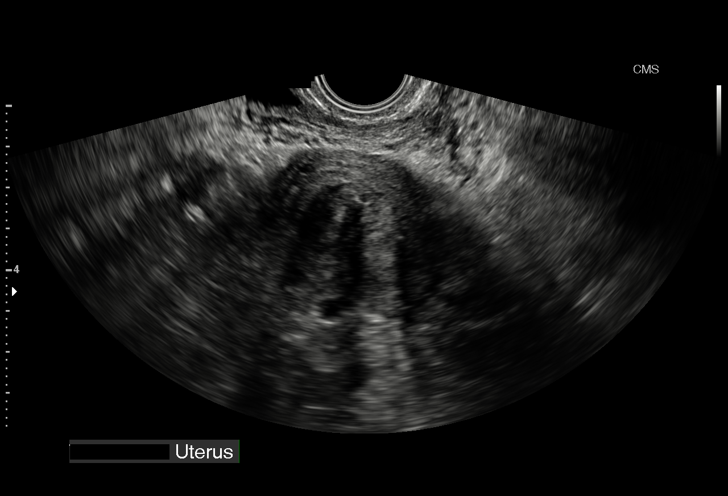
[im 24/42]
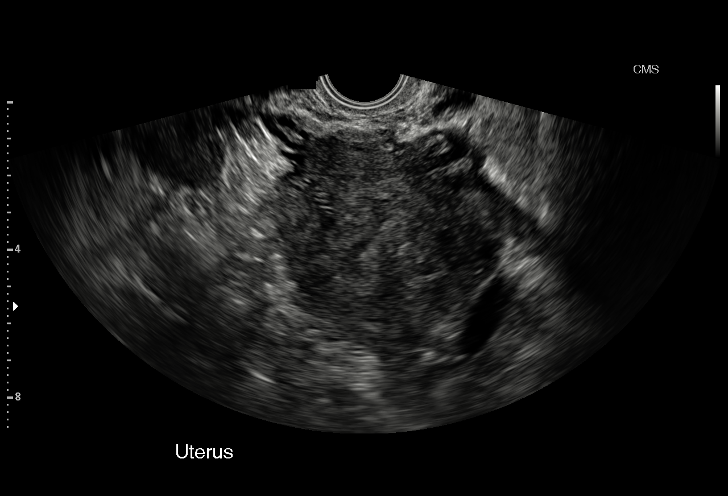
[im 26/42]
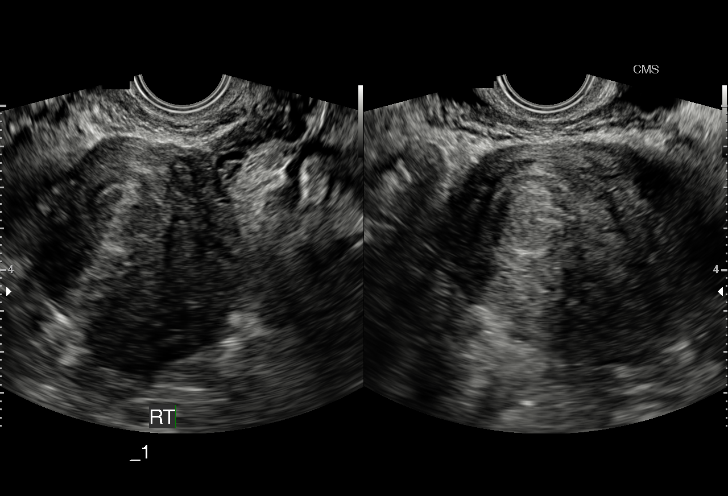
[im 30/42]
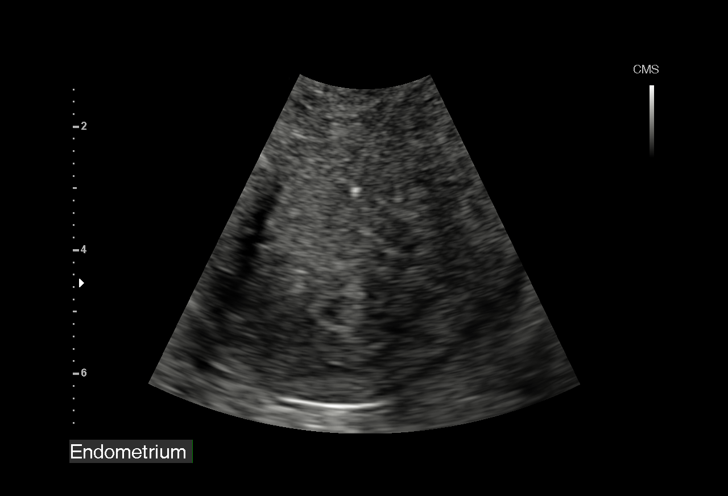
[im 33/42]
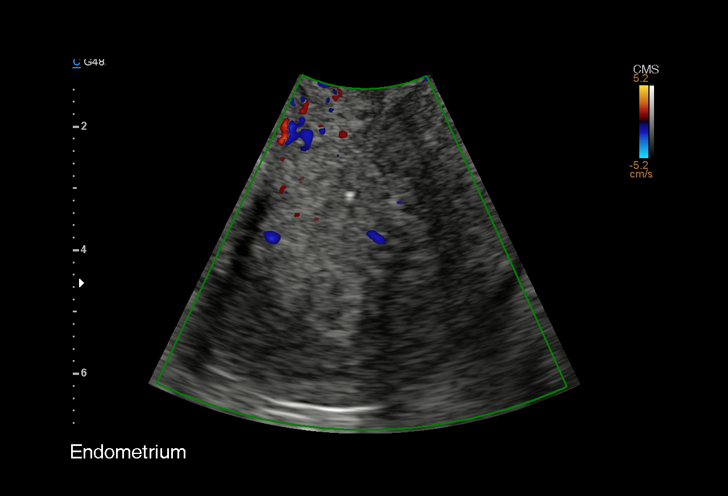
[im 35/42]
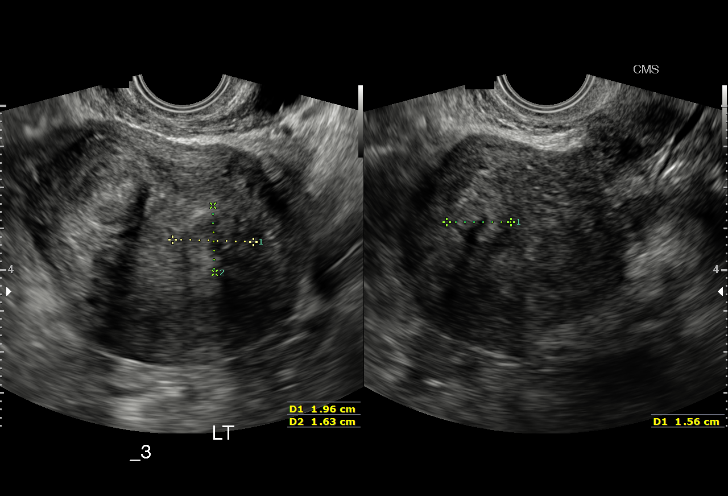
[im 38/42]
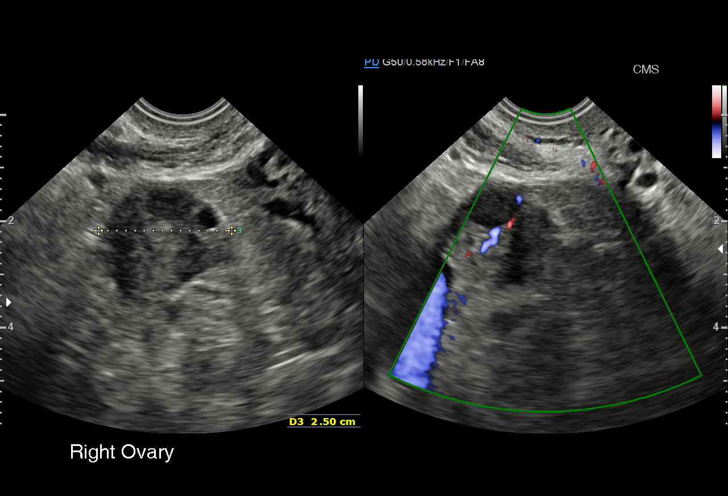
[im 42/42]
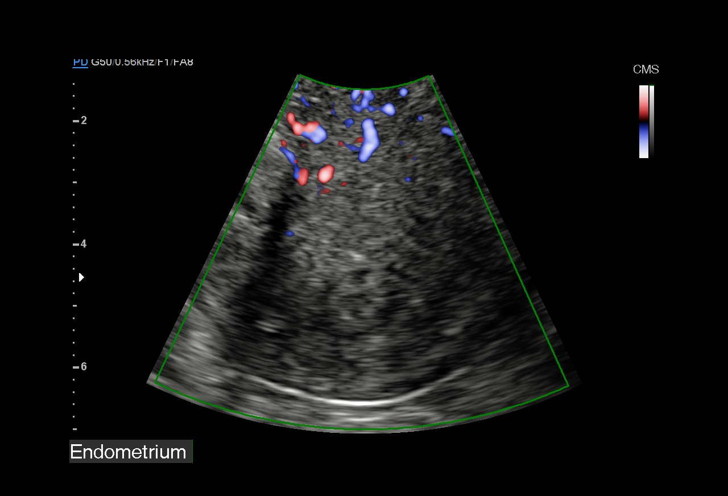

[15 of 25 positions shown; findings below may reference images not displayed]

FINDINGS: Uterus

Measurements: 10.3 x 6.4 x 7.0 cm. At least 3 fibroids including a
2.5 cm right submucosal fibroid, 2.0 cm fundal intramural fibroid,
and 2.0 cm left body intramural fibroid.

Endometrium

Thickness: 7 mm in thickness.  No focal abnormality visualized.

Right ovary

Measurements: 2.5 x 2.5 x 2.1 cm. Normal appearance/no adnexal mass.

Left ovary

Measurements: 3.5 x 3.1 x 2.3 cm. Normal appearance/no adnexal mass.

Other findings

No abnormal free fluid.
IMPRESSION: Fibroid uterus.  No acute findings.

## 2016-05-19 ENCOUNTER — Telehealth: Payer: Self-pay | Admitting: *Deleted

## 2016-05-19 ENCOUNTER — Other Ambulatory Visit: Payer: Self-pay | Admitting: Internal Medicine

## 2016-05-19 NOTE — Telephone Encounter (Signed)
She needs to be seen.

## 2016-05-19 NOTE — Telephone Encounter (Signed)
Notified pt w/MD response. Made appt for tomorrow @ 1...Maureen Price

## 2016-05-19 NOTE — Telephone Encounter (Signed)
Left msg on triage requesting refill on hydrocodone.../lmb 

## 2016-05-20 ENCOUNTER — Ambulatory Visit (INDEPENDENT_AMBULATORY_CARE_PROVIDER_SITE_OTHER): Payer: BLUE CROSS/BLUE SHIELD | Admitting: Internal Medicine

## 2016-05-20 ENCOUNTER — Encounter: Payer: Self-pay | Admitting: Internal Medicine

## 2016-05-20 ENCOUNTER — Other Ambulatory Visit (INDEPENDENT_AMBULATORY_CARE_PROVIDER_SITE_OTHER): Payer: BLUE CROSS/BLUE SHIELD

## 2016-05-20 VITALS — BP 132/78 | HR 97 | Temp 98.4°F | Resp 97 | Ht 64.0 in | Wt 231.0 lb

## 2016-05-20 DIAGNOSIS — I2510 Atherosclerotic heart disease of native coronary artery without angina pectoris: Secondary | ICD-10-CM

## 2016-05-20 DIAGNOSIS — E118 Type 2 diabetes mellitus with unspecified complications: Secondary | ICD-10-CM | POA: Diagnosis not present

## 2016-05-20 DIAGNOSIS — M170 Bilateral primary osteoarthritis of knee: Secondary | ICD-10-CM | POA: Diagnosis not present

## 2016-05-20 DIAGNOSIS — Z794 Long term (current) use of insulin: Secondary | ICD-10-CM | POA: Diagnosis not present

## 2016-05-20 DIAGNOSIS — E785 Hyperlipidemia, unspecified: Secondary | ICD-10-CM

## 2016-05-20 DIAGNOSIS — Z124 Encounter for screening for malignant neoplasm of cervix: Secondary | ICD-10-CM | POA: Insufficient documentation

## 2016-05-20 DIAGNOSIS — F411 Generalized anxiety disorder: Secondary | ICD-10-CM

## 2016-05-20 DIAGNOSIS — M5136 Other intervertebral disc degeneration, lumbar region: Secondary | ICD-10-CM

## 2016-05-20 DIAGNOSIS — Z23 Encounter for immunization: Secondary | ICD-10-CM

## 2016-05-20 LAB — COMPREHENSIVE METABOLIC PANEL
ALBUMIN: 3.9 g/dL (ref 3.5–5.2)
ALK PHOS: 115 U/L (ref 39–117)
ALT: 15 U/L (ref 0–35)
AST: 9 U/L (ref 0–37)
BILIRUBIN TOTAL: 0.4 mg/dL (ref 0.2–1.2)
BUN: 7 mg/dL (ref 6–23)
CALCIUM: 8.9 mg/dL (ref 8.4–10.5)
CHLORIDE: 101 meq/L (ref 96–112)
CO2: 29 mEq/L (ref 19–32)
CREATININE: 0.62 mg/dL (ref 0.40–1.20)
GFR: 137.09 mL/min (ref >60.00)
Glucose, Bld: 291 mg/dL — ABNORMAL HIGH (ref 70–99)
Potassium: 3.6 mEq/L (ref 3.5–5.1)
Sodium: 137 mEq/L (ref 135–145)
TOTAL PROTEIN: 7.5 g/dL (ref 6.0–8.3)

## 2016-05-20 LAB — URINALYSIS, ROUTINE W REFLEX MICROSCOPIC
Bilirubin Urine: NEGATIVE
HGB URINE DIPSTICK: NEGATIVE
KETONES UR: NEGATIVE
LEUKOCYTES UA: NEGATIVE
NITRITE: NEGATIVE
RBC / HPF: NONE SEEN (ref 0–?)
Specific Gravity, Urine: <=1.005 — AB (ref 1.000–1.030)
TOTAL PROTEIN, URINE-UPE24: NEGATIVE
UROBILINOGEN UA: 0.2 (ref 0.0–1.0)
pH: 6 (ref 5.0–8.0)

## 2016-05-20 LAB — LIPID PANEL
CHOLESTEROL: 185 mg/dL (ref 0–200)
HDL: 25 mg/dL — ABNORMAL LOW (ref >39.00)
LDL Cholesterol: 124 mg/dL — ABNORMAL HIGH (ref 0–99)
NONHDL: 160.21
Total CHOL/HDL Ratio: 7
Triglycerides: 181 mg/dL — ABNORMAL HIGH (ref 0.0–149.0)
VLDL: 36.2 mg/dL (ref 0.0–40.0)

## 2016-05-20 LAB — CBC WITH DIFFERENTIAL/PLATELET
BASOS ABS: 0 10*3/uL (ref 0.0–0.1)
BASOS PCT: 0.3 % (ref 0.0–3.0)
EOS ABS: 0.1 10*3/uL (ref 0.0–0.7)
Eosinophils Relative: 1.2 % (ref 0.0–5.0)
HEMATOCRIT: 44.6 % (ref 36.0–46.0)
HEMOGLOBIN: 15.2 g/dL — AB (ref 12.0–15.0)
LYMPHS PCT: 31.1 % (ref 12.0–46.0)
Lymphs Abs: 3.3 10*3/uL (ref 0.7–4.0)
MCHC: 34.2 g/dL (ref 30.0–36.0)
MCV: 81 fl (ref 78.0–100.0)
MONO ABS: 0.4 10*3/uL (ref 0.1–1.0)
Monocytes Relative: 3.6 % (ref 3.0–12.0)
Neutro Abs: 6.8 10*3/uL (ref 1.4–7.7)
Neutrophils Relative %: 63.8 % (ref 43.0–77.0)
Platelets: 303 10*3/uL (ref 150.0–400.0)
RBC: 5.5 Mil/uL — AB (ref 3.87–5.11)
RDW: 14.1 % (ref 11.5–15.5)
WBC: 10.6 10*3/uL — AB (ref 4.0–10.5)

## 2016-05-20 LAB — MICROALBUMIN / CREATININE URINE RATIO
CREATININE, U: 20.7 mg/dL
Microalb Creat Ratio: 3.4 mg/g (ref 0.0–30.0)

## 2016-05-20 LAB — HEMOGLOBIN A1C: HEMOGLOBIN A1C: 9.9 % — AB (ref 4.6–6.5)

## 2016-05-20 MED ORDER — LIRAGLUTIDE 18 MG/3ML ~~LOC~~ SOPN: 1.8000 mg | PEN_INJECTOR | Freq: Every day | SUBCUTANEOUS | 11 refills | Status: DC

## 2016-05-20 MED ORDER — ALPRAZOLAM 0.5 MG PO TABS: ORAL_TABLET | ORAL | 3 refills | Status: DC

## 2016-05-20 MED ORDER — CANAGLIFLOZIN 300 MG PO TABS: 300.0000 mg | ORAL_TABLET | Freq: Every day | ORAL | 3 refills | Status: DC

## 2016-05-20 MED ORDER — INSULIN GLARGINE 300 UNIT/ML ~~LOC~~ SOPN: 50.0000 [IU] | PEN_INJECTOR | Freq: Every day | SUBCUTANEOUS | 11 refills | Status: DC

## 2016-05-20 MED ORDER — GLUCOSE BLOOD VI STRP: ORAL_STRIP | 12 refills | Status: DC

## 2016-05-20 MED ORDER — ONETOUCH VERIO FLEX SYSTEM W/DEVICE KIT: 1.0000 | PACK | Freq: Three times a day (TID) | 0 refills | Status: DC

## 2016-05-20 MED ORDER — HYDROCODONE-ACETAMINOPHEN 10-325 MG PO TABS: 1.0000 | ORAL_TABLET | Freq: Three times a day (TID) | ORAL | 0 refills | Status: DC | PRN

## 2016-05-20 NOTE — Patient Instructions (Signed)

## 2016-05-20 NOTE — Progress Notes (Signed)
Subjective:  Patient ID: Maureen Price, female    DOB: [birth year]  Age: 40 y.o. MRN: [id number]  CC: Diabetes   HPI Maureen Price presents for follow-up on diabetes. She has not been compliant with her medications recently because she ran out. She has had no episodes of polyuria, polydipsia, polyphagia, dizziness, or lightheadedness. She does complain of fatigue, severe anxiety, and pain. She has chronic low back pain and knee pain and says it's interfering with her ability to work. Her husband recently lost several fingers on his right hand due to trauma and she is taking care of him and it is causing her anxiety, panic, and insomnia.    Outpatient Medications Prior to Visit  Medication Sig Dispense Refill  . aspirin EC 81 MG tablet Take 81 mg by mouth daily. Reported on 09/20/2015    . atorvastatin (LIPITOR) 80 MG tablet Take 1 tablet (80 mg total) by mouth daily. 90 tablet 3  . Biotin 1000 MCG tablet Take 1,000 mcg by mouth 2 (two) times daily. Reported on 09/20/2015    . clopidogrel (PLAVIX) 75 MG tablet Take 1 tablet (75 mg total) by mouth daily. 90 tablet 3  . Insulin Pen Needle 32G X 5 MM MISC Use pen needle with victoza insulin injector as directed by your physician. 100 each 3  . nitroGLYCERIN (NITROSTAT) 0.4 MG SL tablet Place 0.4 mg under the tongue every 5 (five) minutes as needed for chest pain. Reported on 09/20/2015    . Omega-3 Fatty Acids (FISH OIL PO) Take 1 tablet by mouth daily.    . potassium chloride SA (K-DUR,KLOR-CON) 20 MEQ tablet Take 1 tablet (20 mEq total) by mouth daily. 30 tablet 3  . ticagrelor (BRILINTA) 90 MG TABS tablet Take 1 tablet (90 mg total) by mouth 2 (two) times daily. (Patient not taking: Reported on 05/20/2016) 32 tablet 0  . ALPRAZolam (XANAX) 0.5 MG tablet TAKE ONE TABLET BY MOUTH AT BEDTIME AS NEEDED FOR ANXIETY 60 tablet 3  . canagliflozin (INVOKANA) 300 MG TABS tablet Take 300 mg by mouth daily. 90 tablet 3  . Coenzyme Q10 (COQ10) 100  MG CAPS Take 200 mg by mouth daily. 30 each   . HYDROcodone-acetaminophen (NORCO) 10-325 MG tablet Take 1 tablet by mouth every 8 (eight) hours as needed. 75 tablet 0  . Insulin Glargine (TOUJEO SOLOSTAR) 300 UNIT/ML SOPN Inject 50 Units into the skin daily. 1.5 mL 11  . Liraglutide (VICTOZA) 18 MG/3ML SOPN Inject 0.3 mLs (1.8 mg total) into the skin daily. 3 mL 11  . Menthol, Topical Analgesic, (BIOFREEZE EX) Apply 1 application topically daily as needed (body pain). Reported on 09/20/2015    . Multiple Vitamins-Minerals (MULTIVITAMIN PO) Take 1 tablet by mouth daily.     No facility-administered medications prior to visit.     ROS Review of Systems  Constitutional: Positive for fatigue and unexpected weight change (wt gain). Negative for activity change, appetite change and diaphoresis.  HENT: Negative.  Negative for sinus pressure, sore throat and trouble swallowing.   Eyes: Negative for visual disturbance.  Respiratory: Negative.  Negative for cough, choking, chest tightness, shortness of breath and stridor.   Cardiovascular: Negative for chest pain, palpitations and leg swelling.  Gastrointestinal: Negative.  Negative for abdominal pain, diarrhea, nausea and vomiting.  Endocrine: Negative for cold intolerance, polydipsia, polyphagia and polyuria.  Genitourinary: Negative.  Negative for difficulty urinating.  Musculoskeletal: Positive for back pain. Negative for arthralgias, joint swelling and myalgias.  Skin:  Negative.   Neurological: Negative.  Negative for dizziness, tremors, weakness, light-headedness, numbness and headaches.  Hematological: Negative for adenopathy. Does not bruise/bleed easily.  Psychiatric/Behavioral: Positive for sleep disturbance. Negative for agitation, behavioral problems, confusion, decreased concentration, dysphoric mood, hallucinations, self-injury and suicidal ideas. The patient is nervous/anxious. The patient is not hyperactive.     Objective:  BP 132/78  (BP Location: Left Arm, Patient Position: Sitting, Cuff Size: Normal)   Pulse 97   Temp 98.4 F (36.9 C) (Oral)   Resp (!) 97   Ht 5' 4"  (1.626 m)   Wt 231 lb (104.8 kg)   SpO2 97%   BMI 39.65 kg/m   BP Readings from Last 3 Encounters:  05/20/16 132/78  10/08/15 126/78  09/20/15 (!) 145/86    Wt Readings from Last 3 Encounters:  05/20/16 231 lb (104.8 kg)  10/08/15 229 lb (103.9 kg)  09/20/15 223 lb 14.4 oz (101.6 kg)    Physical Exam  Constitutional: She is oriented to person, place, and time. No distress.  HENT:  Mouth/Throat: Oropharynx is clear and moist. No oropharyngeal exudate.  Eyes: Conjunctivae are normal. Right eye exhibits no discharge. Left eye exhibits no discharge. No scleral icterus.  Neck: Normal range of motion. Neck supple. No JVD present. No tracheal deviation present. No thyromegaly present.  Cardiovascular: Normal rate, regular rhythm, normal heart sounds and intact distal pulses.  Exam reveals no gallop and no friction rub.   No murmur heard. Pulmonary/Chest: Effort normal and breath sounds normal. No stridor. No respiratory distress. She has no wheezes. She has no rales. She exhibits no tenderness.  Abdominal: Soft. Bowel sounds are normal. She exhibits no distension and no mass. There is no tenderness. There is no rebound and no guarding.  Musculoskeletal: Normal range of motion. She exhibits no edema, tenderness or deformity.  Lymphadenopathy:    She has no cervical adenopathy.  Neurological: She is oriented to person, place, and time.  Skin: Skin is warm and dry. No rash noted. She is not diaphoretic. No erythema. No pallor.  Psychiatric: Judgment normal. Her mood appears anxious. Her speech is not rapid and/or pressured. She is not agitated and not slowed. Cognition and memory are normal. She does not exhibit a depressed mood. She expresses no homicidal ideation. She expresses no suicidal plans and no homicidal plans.  Vitals reviewed.   Lab  Results  Component Value Date   WBC 10.6 (H) 05/20/2016   HGB 15.2 (H) 05/20/2016   HCT 44.6 05/20/2016   PLT 303.0 05/20/2016   GLUCOSE 291 (H) 05/20/2016   CHOL 185 05/20/2016   TRIG 181.0 (H) 05/20/2016   HDL 25.00 (L) 05/20/2016   LDLCALC 124 (H) 05/20/2016   ALT 15 05/20/2016   AST 9 05/20/2016   NA 137 05/20/2016   K 3.6 05/20/2016   CL 101 05/20/2016   CREATININE 0.62 05/20/2016   BUN 7 05/20/2016   CO2 29 05/20/2016   TSH 2.40 08/26/2015   INR 1.04 01/16/2015   HGBA1C 9.9 (H) 05/20/2016   MICROALBUR <0.7 05/20/2016    Dg Chest 1 View  Result Date: 03/18/2016 CLINICAL DATA:  Positive TB test, no current chest complaints, current smoker, history of coronary artery disease and previous MI at, diabetes. EXAM: CHEST 1 VIEW COMPARISON:  PA and lateral chest x-ray of Jan 21, 2015 FINDINGS: The lungs are borderline hypoinflated. The interstitial markings are coarse though stable. There is no alveolar infiltrate. There is no pleural effusion. The heart is normal in  size. The pulmonary vascularity is mildly prominent centrally but stable. The mediastinum is normal in width. The bony thorax is unremarkable. IMPRESSION: Study is somewhat limited due to mild hypo inflation. There is mild stable interstitial prominence. There is no evidence of acute or old tuberculous infection nor other active cardiopulmonary disease. Electronically Signed   By: David  Martinique M.D.   On: 03/18/2016 12:41    Assessment & Plan:   Elivia was seen today for diabetes.  Diagnoses and all orders for this visit:  Coronary artery disease involving native coronary artery of native heart without angina pectoris- she's had no recent episodes of angina, she does need to improve her comorbid illnesses to avoid future events so I have asked her to be more compliant with blood sugar control and LDL reduction. -     CBC with Differential/Platelet; Future -     Lipid panel; Future -     ezetimibe (ZETIA) 10 MG  tablet; Take 1 tablet (10 mg total) by mouth daily.  Controlled type 2 diabetes mellitus with complication, with long-term current use of insulin ([place])- her A1c is 9.9% which is not well controlled but it is better than it was the last time I checked 8 months ago. She has a relatively high C-peptide level so we'll continue to treat her as a type II diabetic. I've asked her to start her usual medications for blood sugar reduction and basal insulin therapy as well. -     Comprehensive metabolic panel; Future -     Hemoglobin A1c; Future -     C-peptide; Future -     Urinalysis, Routine w reflex microscopic (not at [facility]); Future -     Microalbumin / creatinine urine ratio; Future -     Liraglutide (VICTOZA) 18 MG/3ML SOPN; Inject 0.3 mLs (1.8 mg total) into the skin daily. -     canagliflozin (INVOKANA) 300 MG TABS tablet; Take 1 tablet (300 mg total) by mouth daily. -     Insulin Glargine (TOUJEO SOLOSTAR) 300 UNIT/ML SOPN; Inject 50 Units into the skin daily. -     Ambulatory referral to Ophthalmology -     Amb Referral to Nutrition and Diabetic E -     glucose blood test strip; Use TID OneTouch Verio -     Blood Glucose Monitoring Suppl (ONETOUCH VERIO FLEX SYSTEM) w/Device KIT; 1 Act by Does not apply route 3 (three) times daily.  DDD (degenerative disc disease), lumbar- will try Norco for pain control. -     HYDROcodone-acetaminophen (NORCO) 10-325 MG tablet; Take 1 tablet by mouth every 8 (eight) hours as needed.  Primary osteoarthritis of both knees- will try Norco for pain control.  Dyslipidemia, goal LDL below 70- she has not achieved her LDL goal so I've asked her to add Zetia to the statin therapy. -     Lipid panel; Future -     ezetimibe (ZETIA) 10 MG tablet; Take 1 tablet (10 mg total) by mouth daily.  GAD (generalized anxiety disorder) -     ALPRAZolam (XANAX) 0.5 MG tablet; TAKE ONE TABLET BY MOUTH AT BEDTIME AS NEEDED FOR ANXIETY  Screening for cervical cancer -      Ambulatory referral to Gynecology  Need for prophylactic vaccination and inoculation against influenza -     Flu Vaccine QUAD 36+ mos IM   I have discontinued Ms. Kelsay's Multiple Vitamins-Minerals (MULTIVITAMIN PO), (Menthol, Topical Analgesic, (BIOFREEZE EX)), and CoQ10. I have also changed her canagliflozin. Additionally, I  am having her start on glucose blood, ONETOUCH VERIO FLEX SYSTEM, and ezetimibe. Lastly, I am having her maintain her aspirin EC, nitroGLYCERIN, Biotin, Insulin Pen Needle, potassium chloride SA, Omega-3 Fatty Acids (FISH OIL PO), clopidogrel, ticagrelor, atorvastatin, ALPRAZolam, HYDROcodone-acetaminophen, Liraglutide, and Insulin Glargine.  Meds ordered this encounter  Medications  . ALPRAZolam (XANAX) 0.5 MG tablet    Sig: TAKE ONE TABLET BY MOUTH AT BEDTIME AS NEEDED FOR ANXIETY    Dispense:  60 tablet    Refill:  3  . HYDROcodone-acetaminophen (NORCO) 10-325 MG tablet    Sig: Take 1 tablet by mouth every 8 (eight) hours as needed.    Dispense:  75 tablet    Refill:  0  . Liraglutide (VICTOZA) 18 MG/3ML SOPN    Sig: Inject 0.3 mLs (1.8 mg total) into the skin daily.    Dispense:  3 mL    Refill:  11  . canagliflozin (INVOKANA) 300 MG TABS tablet    Sig: Take 1 tablet (300 mg total) by mouth daily.    Dispense:  90 tablet    Refill:  3  . Insulin Glargine (TOUJEO SOLOSTAR) 300 UNIT/ML SOPN    Sig: Inject 50 Units into the skin daily.    Dispense:  1.5 mL    Refill:  11  . glucose blood test strip    Sig: Use TID OneTouch Verio    Dispense:  100 each    Refill:  12    Onetouch Verio  . Blood Glucose Monitoring Suppl (ONETOUCH VERIO FLEX SYSTEM) w/Device KIT    Sig: 1 Act by Does not apply route 3 (three) times daily.    Dispense:  2 kit    Refill:  0  . ezetimibe (ZETIA) 10 MG tablet    Sig: Take 1 tablet (10 mg total) by mouth daily.    Dispense:  90 tablet    Refill:  3     Follow-up: Return in about 3 months (around 08/19/2016).  [name], MD

## 2016-05-20 NOTE — Progress Notes (Signed)
Pre visit review using our clinic review tool, if applicable. No additional management support is needed unless otherwise documented below in the visit note. 

## 2016-05-21 ENCOUNTER — Encounter: Payer: Self-pay | Admitting: Internal Medicine

## 2016-05-21 LAB — C-PEPTIDE: C-Peptide: 3.09 ng/mL (ref 0.80–3.85)

## 2016-05-21 MED ORDER — EZETIMIBE 10 MG PO TABS: 10.0000 mg | ORAL_TABLET | Freq: Every day | ORAL | 3 refills | Status: DC

## 2016-05-25 ENCOUNTER — Telehealth: Payer: Self-pay

## 2016-05-25 NOTE — Telephone Encounter (Signed)
PA initiated an APPROVED via CoverMyMeds 04/25/2016;Coverage End Date:05/25/2017 key [id number] Pt to be notified via CoverMyMeds

## 2016-06-04 ENCOUNTER — Telehealth: Payer: Self-pay | Admitting: Obstetrics and Gynecology

## 2016-06-04 NOTE — Telephone Encounter (Signed)
Called and left a message for patient to call back to schedule a new patient doctor referral. Patient works nights and I will call back to tomorrow when she is off.

## 2016-06-13 ENCOUNTER — Telehealth: Payer: Self-pay

## 2016-06-13 NOTE — Telephone Encounter (Signed)
PA initiated and APPROVED via CoverMyMeds 05/14/2016 through 06/13/2017

## 2016-06-15 ENCOUNTER — Telehealth: Payer: Self-pay | Admitting: Obstetrics and Gynecology

## 2016-06-15 ENCOUNTER — Encounter: Payer: Self-pay | Admitting: Obstetrics and Gynecology

## 2016-06-15 ENCOUNTER — Ambulatory Visit (INDEPENDENT_AMBULATORY_CARE_PROVIDER_SITE_OTHER): Payer: BLUE CROSS/BLUE SHIELD | Admitting: Obstetrics and Gynecology

## 2016-06-15 VITALS — BP 118/70 | HR 100 | Resp 18 | Ht 64.5 in | Wt 236.0 lb

## 2016-06-15 DIAGNOSIS — D259 Leiomyoma of uterus, unspecified: Secondary | ICD-10-CM

## 2016-06-15 DIAGNOSIS — N946 Dysmenorrhea, unspecified: Secondary | ICD-10-CM

## 2016-06-15 DIAGNOSIS — Z124 Encounter for screening for malignant neoplasm of cervix: Secondary | ICD-10-CM

## 2016-06-15 DIAGNOSIS — Z01419 Encounter for gynecological examination (general) (routine) without abnormal findings: Secondary | ICD-10-CM

## 2016-06-15 DIAGNOSIS — Z0000 Encounter for general adult medical examination without abnormal findings: Secondary | ICD-10-CM | POA: Diagnosis not present

## 2016-06-15 DIAGNOSIS — N920 Excessive and frequent menstruation with regular cycle: Secondary | ICD-10-CM

## 2016-06-15 LAB — POCT URINALYSIS DIPSTICK
Bilirubin, UA: NEGATIVE
Blood, UA: NEGATIVE
GLUCOSE UA: 1000
KETONES UA: NEGATIVE
LEUKOCYTES UA: NEGATIVE
Nitrite, UA: NEGATIVE
PROTEIN UA: NEGATIVE
Urobilinogen, UA: NEGATIVE
pH, UA: 6

## 2016-06-15 LAB — HM PAP SMEAR

## 2016-06-15 NOTE — Progress Notes (Signed)
40 y.o. [id number] MarriedAfrican AmericanF here for annual exam. Patient complains of last menstrual being a couple of days early and heavy with clots. Period Cycle (Days): 28 Period Duration (Days): 7 days Period Pattern: (!) Irregular Menstrual Flow: Heavy, Moderate Menstrual Control: Maxi pad Menstrual Control Change Freq (Hours): 1-2 hours Dysmenorrhea: (!) Severe Dysmenorrhea Symptoms: Cramping, Throbbing, Nausea, Diarrhea, Headache, Other (Comment) (backache)  Sexually active, no pain.  She is a smoker, under lots of stress. Kids are 22, 20, 17. Also has 23 year old off and on. Grandchildren are 82 and 74. 32 year old 40 year old and 40 year old are at home.  H/O a MI at 26. She has poorly controlled diabetes. Smoker.  Patient's last menstrual period was 06/14/2016.      tuabl ligation    Sexually active: Yes.    The current method of family planning is none.    Exercising: Yes.    walking Smoker:  Yes, 1 PPD  Health Maintenance: Pap:  17 years ago per patient History of abnormal Pap:  no MMG:  never Colonoscopy:  never BMD:   n/a TDaP:  2015 per patient Gardasil: never   reports that she has been smoking Cigarettes.  She has a 10.00 pack-year smoking history. She has never used smokeless tobacco. She reports that she does not drink alcohol or use drugs.[employer] as a [employer] manager, 12 hour shifts.  Past Medical History:  Diagnosis Date  . Acute myocardial infarction of other anterior wall, initial episode of care   . Anginal pain ([place])   . Anxiety   . Arthritis   . Asthma   . CAD (coronary artery disease)   . CHF (congestive heart failure) ([place])   . Depression   . Diabetes mellitus    2002; no meds since 2004  . Dyslipidemia, goal LDL below 70 10/26/2013  . Dysrhythmia   . GERD (gastroesophageal reflux disease)   . Headache(784.0)   . Obesity   . Paroxysmal ventricular tachycardia ([place])   . Shortness of breath   . STEMI (ST elevation myocardial infarction) ([place])   . Tobacco abuse      Past Surgical History:  Procedure Laterality Date  . CESAREAN SECTION    . CESAREAN SECTION  1994,1997,2000  . CHOLECYSTECTOMY    . CORONARY ANGIOPLASTY WITH STENT PLACEMENT    . GALLBLADDER SURGERY    . LEFT HEART CATH N/A 10/24/2013   Procedure: LEFT HEART CATH;  Surgeon: Lorretta Harp, MD;  Location[facility] LAB;  Service: Cardiovascular;  Laterality: N/A;  . LEFT HEART CATHETERIZATION WITH CORONARY ANGIOGRAM N/A 10/26/2013   Procedure: LEFT HEART CATHETERIZATION WITH CORONARY ANGIOGRAM;  Surgeon: Burnell Blanks, MD;  Location: [facility] CATH LAB;  Service: Cardiovascular;  Laterality: N/A;  . LEFT HEART CATHETERIZATION WITH CORONARY ANGIOGRAM N/A 03/29/2014   Procedure: LEFT HEART CATHETERIZATION WITH CORONARY ANGIOGRAM;  Surgeon: Lorretta Harp, MD;  Location: [facility] CATH LAB;  Service: Cardiovascular;  Laterality: N/A;  . TUBAL LIGATION     2000    Current Outpatient Prescriptions  Medication Sig Dispense Refill  . ALPRAZolam (XANAX) 0.5 MG tablet TAKE ONE TABLET BY MOUTH AT BEDTIME AS NEEDED FOR ANXIETY 60 tablet 3  . aspirin EC 81 MG tablet Take 81 mg by mouth daily. Reported on 09/20/2015    . atorvastatin (LIPITOR) 80 MG tablet Take 1 tablet (80 mg total) by mouth daily. 90 tablet 3  . Biotin 1000 MCG tablet Take 1,000 mcg by mouth 2 (two)  times daily. Reported on 09/20/2015    . Blood Glucose Monitoring Suppl (ONETOUCH VERIO FLEX SYSTEM) w/Device KIT 1 Act by Does not apply route 3 (three) times daily. 2 kit 0  . canagliflozin (INVOKANA) 300 MG TABS tablet Take 1 tablet (300 mg total) by mouth daily. 90 tablet 3  . clopidogrel (PLAVIX) 75 MG tablet Take 1 tablet (75 mg total) by mouth daily. 90 tablet 3  . ezetimibe (ZETIA) 10 MG tablet Take 1 tablet (10 mg total) by mouth daily. 90 tablet 3  . glucose blood test strip Use TID OneTouch Verio 100 each 12  . HYDROcodone-acetaminophen (NORCO) 10-325 MG tablet Take 1 tablet by mouth every 8 (eight) hours as needed. 75 tablet 0   . Insulin Glargine (TOUJEO SOLOSTAR) 300 UNIT/ML SOPN Inject 50 Units into the skin daily. 1.5 mL 11  . Insulin Pen Needle 32G X 5 MM MISC Use pen needle with victoza insulin injector as directed by your physician. 100 each 3  . Liraglutide (VICTOZA) 18 MG/3ML SOPN Inject 0.3 mLs (1.8 mg total) into the skin daily. 3 mL 11  . nitroGLYCERIN (NITROSTAT) 0.4 MG SL tablet Place 0.4 mg under the tongue every 5 (five) minutes as needed for chest pain. Reported on 09/20/2015    . Omega-3 Fatty Acids (FISH OIL PO) Take 1 tablet by mouth daily.    . potassium chloride SA (K-DUR,KLOR-CON) 20 MEQ tablet Take 1 tablet (20 mEq total) by mouth daily. 30 tablet 3  . ticagrelor (BRILINTA) 90 MG TABS tablet Take 1 tablet (90 mg total) by mouth 2 (two) times daily. 32 tablet 0   No current facility-administered medications for this visit.     Family History  Problem Relation Age of Onset  . Arthritis Mother   . Diabetes Mother   . Heart disease Father   . Stroke Father   . Diabetes Maternal Grandfather   . Cancer Neg Hx   . Alcohol abuse Neg Hx   . Early death Neg Hx   . Kidney disease Neg Hx   . Hypertension Neg Hx   . Hyperlipidemia Neg Hx     Review of Systems  Constitutional: Negative.   HENT: Negative.   Eyes: Negative.   Respiratory: Negative.   Cardiovascular: Negative.   Gastrointestinal: Negative.   Endocrine: Negative.   Genitourinary: Negative.   Musculoskeletal: Negative.   Skin: Negative.   Allergic/Immunologic: Negative.   Neurological: Negative.   Hematological: Negative.   Psychiatric/Behavioral: Negative.     Exam:   BP 118/70 (BP Location: Right Arm, Patient Position: Sitting, Cuff Size: Normal)   Pulse 100   Resp 18   Ht 5' 4.5" (1.638 m)   Wt 236 lb (107 kg)   LMP 06/14/2016 Comment: tubal ligation  BMI 39.88 kg/m   Weight change: @WEIGHTCHANGE @ Height:   Height: 5' 4.5" (163.8 cm)  Ht Readings from Last 3 Encounters:  06/15/16 5' 4.5" (1.638 m)  05/20/16 5'  4" (1.626 m)  10/08/15 5' 4"  (1.626 m)    General appearance: alert, cooperative and appears stated age Head: Normocephalic, without obvious abnormality, atraumatic Neck: no adenopathy, supple, symmetrical, trachea midline and thyroid normal to inspection and palpation Lungs: clear to auscultation bilaterally Breasts: normal appearance, no masses or tenderness Heart: regular rate and rhythm Abdomen: soft, non-tender; bowel sounds normal; no masses,  no organomegaly Extremities: extremities normal, atraumatic, no cyanosis or edema Skin: Skin color, texture, turgor normal. No rashes or lesions Lymph nodes: Cervical, supraclavicular, and axillary  nodes normal. No abnormal inguinal nodes palpated Neurologic: Grossly normal   Pelvic: External genitalia:  no lesions              Urethra:  normal appearing urethra with no masses, tenderness or lesions              Bartholins and Skenes: normal                 Vagina: normal appearing vagina with normal color and discharge, no lesions              Cervix: no lesions               Bimanual Exam:  Uterus:  anteverted, not appreciably enlarged, mobile, not tender, limited by BMI              Adnexa: no mass, fullness, tenderness               Rectovaginal: Confirms               Anus:  normal sphincter tone, no lesions  Chaperone was present for exam.  A:  Well Woman with normal exam  Heavy cycles, not anemic  Dysmenorrhea  H/O MI  Poorly controlled diabetes  Smoker, no plans to quit  Elevated lipids  Fibroid uterus, reviewed images from 1/17 with the patient, ? Submucosal fibroid  P:   Pap with hpv  Mammogram (#given)  Discussed the mirnea IUD for cycle control and help with dysmenorrhea  She was asking about hysterectomy, I advised her that the mirena would be much safer for her, currently she is not a good surgical candidate for major surgery with her poorly controlled diabetes (would need cardiac clearance as well)  Recommend she  come back for a sonohysterogram, prior to trying an IUD  Discussed breast self exam  Discussed calcium and vit D intake

## 2016-06-15 NOTE — Telephone Encounter (Signed)
Called patient to review benefits for a recommended procedure. Left Voicemail requesting a call back. °

## 2016-06-15 NOTE — Patient Instructions (Signed)

## 2016-06-17 LAB — IPS PAP TEST WITH HPV

## 2016-06-18 ENCOUNTER — Telehealth: Payer: Self-pay | Admitting: *Deleted

## 2016-06-18 NOTE — Telephone Encounter (Signed)
Left message to call regarding her lab results -eh

## 2016-06-18 NOTE — Telephone Encounter (Signed)
-----   Message from Salvadore Dom, MD sent at 06/17/2016  3:31 PM EDT ----- Please inform the patient that her pap was + for BV and IF SYMPTOMATIC treat with flagyl (either oral or vaginal, her choice), no ETOH while on Flagyl.  Oral: Flagyl 500 mg BID x 7 days, or Vaginal: Metrogel, 1 applicator per vagina q day x 5 days. 02

## 2016-06-18 NOTE — Telephone Encounter (Signed)
Called patient to review benefits for a recommended procedure. Left Voicemail requesting a call back. °

## 2016-06-30 NOTE — Telephone Encounter (Signed)
Spoke with patient and went over results. Patient is not currently having BV symptoms so RX was not sent in for the Flagyl. -eh

## 2016-07-06 ENCOUNTER — Ambulatory Visit: Payer: Self-pay | Admitting: Endocrinology

## 2016-07-06 DIAGNOSIS — Z0289 Encounter for other administrative examinations: Secondary | ICD-10-CM

## 2016-07-20 ENCOUNTER — Telehealth: Payer: Self-pay | Admitting: Obstetrics and Gynecology

## 2016-07-20 NOTE — Telephone Encounter (Signed)
Patient cancelled ultrasound appointment for Thursday and will call back to reschedule.

## 2016-07-22 ENCOUNTER — Encounter: Payer: Self-pay | Admitting: Internal Medicine

## 2016-07-22 ENCOUNTER — Telehealth: Payer: Self-pay | Admitting: *Deleted

## 2016-07-22 ENCOUNTER — Other Ambulatory Visit: Payer: Self-pay | Admitting: Internal Medicine

## 2016-07-22 DIAGNOSIS — M5136 Other intervertebral disc degeneration, lumbar region: Secondary | ICD-10-CM

## 2016-07-22 MED ORDER — HYDROCODONE-ACETAMINOPHEN 10-325 MG PO TABS: 1.0000 | ORAL_TABLET | Freq: Three times a day (TID) | ORAL | 0 refills | Status: DC | PRN

## 2016-07-22 NOTE — Telephone Encounter (Signed)
Notified pt rx ready for pick-up.../lmb 

## 2016-07-22 NOTE — Telephone Encounter (Signed)
RX written  Please get a UDS 

## 2016-07-22 NOTE — Telephone Encounter (Signed)
Rec'd call pt requesting refill on her Hydrocodone. No UDS on file. Last ov 05/20/16...Johny Chess

## 2016-07-23 ENCOUNTER — Other Ambulatory Visit: Payer: BLUE CROSS/BLUE SHIELD

## 2016-07-23 ENCOUNTER — Other Ambulatory Visit: Payer: BLUE CROSS/BLUE SHIELD | Admitting: Obstetrics and Gynecology

## 2016-08-04 ENCOUNTER — Telehealth: Payer: Self-pay | Admitting: *Deleted

## 2016-08-04 NOTE — Telephone Encounter (Signed)
-----   Message from Salvadore Dom, MD sent at 07/31/2016  2:03 PM EST ----- The patient has very heavy, crampy cycles. Not anemic. She canceled her sonohysterogram/U/S. Please check if she wants to proceed with this and possible IUD for cycle control. If not, please cancel the order.  Thanks, Sharee Pimple ----- Message ----- From: SYSTEM Sent: 07/20/2016  12:05 AM To: Salvadore Dom, MD

## 2016-08-04 NOTE — Telephone Encounter (Signed)
Spoke with patient and she said that she is planning to get the sonohysterogram at the first of the year she can not afford it right now. She is having BV symptoms and is requesting a RX for this. She tested positive for BV on her PAP last month but was not having symptoms at that time. Please advise

## 2016-08-05 MED ORDER — METRONIDAZOLE 500 MG PO TABS: 500.0000 mg | ORAL_TABLET | Freq: Two times a day (BID) | ORAL | 0 refills | Status: DC

## 2016-08-05 NOTE — Telephone Encounter (Signed)
Spoke with patient RX for Flagyl sent to pharmacy per patient request -eh

## 2016-08-05 NOTE — Telephone Encounter (Signed)
Go ahead and treat BV Treat with flagyl (either oral or vaginal, her choice), no ETOH while on Flagyl.  Oral: Flagyl 500 mg BID x 7 days, or Vaginal: Metrogel, 1 applicator per vagina q day x 5 days.

## 2016-08-12 ENCOUNTER — Other Ambulatory Visit: Payer: Self-pay | Admitting: Internal Medicine

## 2016-08-13 ENCOUNTER — Telehealth: Payer: Self-pay

## 2016-08-13 NOTE — Telephone Encounter (Signed)
Contacted pt and informed that rx for xanax and hydrocodone have been discontinued due the the results being negative.   Pt stated that it was negative because she had ran out. Informed pt that I would forward that message.

## 2016-09-18 ENCOUNTER — Emergency Department (HOSPITAL_COMMUNITY): Payer: BLUE CROSS/BLUE SHIELD

## 2016-09-18 ENCOUNTER — Observation Stay (HOSPITAL_COMMUNITY)
Admission: EM | Admit: 2016-09-18 | Discharge: 2016-09-19 | Disposition: A | Discharge status: Home / Self Care | Payer: BLUE CROSS/BLUE SHIELD | Attending: Family Medicine | Admitting: Family Medicine

## 2016-09-18 ENCOUNTER — Encounter (HOSPITAL_COMMUNITY): Payer: Self-pay | Admitting: Emergency Medicine

## 2016-09-18 DIAGNOSIS — E785 Hyperlipidemia, unspecified: Secondary | ICD-10-CM

## 2016-09-18 DIAGNOSIS — Z794 Long term (current) use of insulin: Secondary | ICD-10-CM | POA: Diagnosis not present

## 2016-09-18 DIAGNOSIS — E118 Type 2 diabetes mellitus with unspecified complications: Secondary | ICD-10-CM | POA: Diagnosis not present

## 2016-09-18 DIAGNOSIS — IMO0002 Reserved for concepts with insufficient information to code with codable children: Secondary | ICD-10-CM | POA: Diagnosis present

## 2016-09-18 DIAGNOSIS — J45909 Unspecified asthma, uncomplicated: Secondary | ICD-10-CM | POA: Insufficient documentation

## 2016-09-18 DIAGNOSIS — R072 Precordial pain: Principal | ICD-10-CM | POA: Insufficient documentation

## 2016-09-18 DIAGNOSIS — Z79899 Other long term (current) drug therapy: Secondary | ICD-10-CM | POA: Diagnosis not present

## 2016-09-18 DIAGNOSIS — Z7902 Long term (current) use of antithrombotics/antiplatelets: Secondary | ICD-10-CM | POA: Diagnosis not present

## 2016-09-18 DIAGNOSIS — E669 Obesity, unspecified: Secondary | ICD-10-CM | POA: Insufficient documentation

## 2016-09-18 DIAGNOSIS — I509 Heart failure, unspecified: Secondary | ICD-10-CM | POA: Diagnosis not present

## 2016-09-18 DIAGNOSIS — Z7982 Long term (current) use of aspirin: Secondary | ICD-10-CM | POA: Insufficient documentation

## 2016-09-18 DIAGNOSIS — R079 Chest pain, unspecified: Secondary | ICD-10-CM | POA: Diagnosis present

## 2016-09-18 DIAGNOSIS — I110 Hypertensive heart disease with heart failure: Secondary | ICD-10-CM | POA: Diagnosis not present

## 2016-09-18 DIAGNOSIS — I252 Old myocardial infarction: Secondary | ICD-10-CM | POA: Diagnosis not present

## 2016-09-18 DIAGNOSIS — Z6839 Body mass index (BMI) 39.0-39.9, adult: Secondary | ICD-10-CM | POA: Insufficient documentation

## 2016-09-18 DIAGNOSIS — E1165 Type 2 diabetes mellitus with hyperglycemia: Secondary | ICD-10-CM | POA: Insufficient documentation

## 2016-09-18 DIAGNOSIS — E1151 Type 2 diabetes mellitus with diabetic peripheral angiopathy without gangrene: Secondary | ICD-10-CM | POA: Diagnosis present

## 2016-09-18 DIAGNOSIS — F17200 Nicotine dependence, unspecified, uncomplicated: Secondary | ICD-10-CM | POA: Diagnosis not present

## 2016-09-18 DIAGNOSIS — Z955 Presence of coronary angioplasty implant and graft: Secondary | ICD-10-CM | POA: Diagnosis not present

## 2016-09-18 DIAGNOSIS — R9389 Abnormal findings on diagnostic imaging of other specified body structures: Secondary | ICD-10-CM

## 2016-09-18 DIAGNOSIS — F17210 Nicotine dependence, cigarettes, uncomplicated: Secondary | ICD-10-CM | POA: Diagnosis not present

## 2016-09-18 DIAGNOSIS — I70209 Unspecified atherosclerosis of native arteries of extremities, unspecified extremity: Secondary | ICD-10-CM

## 2016-09-18 DIAGNOSIS — I2510 Atherosclerotic heart disease of native coronary artery without angina pectoris: Secondary | ICD-10-CM | POA: Insufficient documentation

## 2016-09-18 LAB — CBC
HEMATOCRIT: 39.5 % (ref 36.0–46.0)
HEMOGLOBIN: 14 g/dL (ref 12.0–15.0)
MCH: 27.9 pg (ref 26.0–34.0)
MCHC: 35.4 g/dL (ref 30.0–36.0)
MCV: 78.8 fL (ref 78.0–100.0)
Platelets: 266 10*3/uL (ref 150–400)
RBC: 5.01 MIL/uL (ref 3.87–5.11)
RDW: 13.7 % (ref 11.5–15.5)
WBC: 9.2 10*3/uL (ref 4.0–10.5)

## 2016-09-18 LAB — BASIC METABOLIC PANEL
Anion gap: 10 (ref 5–15)
CHLORIDE: 103 mmol/L (ref 101–111)
CO2: 24 mmol/L (ref 22–32)
Calcium: 9.1 mg/dL (ref 8.9–10.3)
Creatinine, Ser: 0.53 mg/dL (ref 0.44–1.00)
GFR calc Af Amer: >60 mL/min (ref >60)
GFR calc non Af Amer: >60 mL/min (ref >60)
Glucose, Bld: 321 mg/dL — ABNORMAL HIGH (ref 65–99)
POTASSIUM: 3.6 mmol/L (ref 3.5–5.1)
SODIUM: 137 mmol/L (ref 135–145)

## 2016-09-18 LAB — INFLUENZA PANEL BY PCR (TYPE A & B)
INFLAPCR: NEGATIVE
INFLBPCR: NEGATIVE

## 2016-09-18 LAB — I-STAT TROPONIN, ED: Troponin i, poc: 0 ng/mL (ref 0.00–0.08)

## 2016-09-18 LAB — TROPONIN I: Troponin I: <0.03 ng/mL (ref <0.03)

## 2016-09-18 LAB — GLUCOSE, CAPILLARY: Glucose-Capillary: 381 mg/dL — ABNORMAL HIGH (ref 65–99)

## 2016-09-18 MED ORDER — INSULIN ASPART 100 UNIT/ML ~~LOC~~ SOLN
0.0000 [IU] | Freq: Three times a day (TID) | SUBCUTANEOUS | Status: DC
  Administered 2016-09-18: 9 [IU] via SUBCUTANEOUS
  Administered 2016-09-19 (×2): 5 [IU] via SUBCUTANEOUS

## 2016-09-18 MED ORDER — ACETAMINOPHEN 325 MG PO TABS: 650.0000 mg | ORAL_TABLET | ORAL | Status: DC | PRN

## 2016-09-18 MED ORDER — OMEGA-3-ACID ETHYL ESTERS 1 G PO CAPS
1.0000 g | ORAL_CAPSULE | Freq: Every day | ORAL | Status: DC
  Administered 2016-09-19: 1 g via ORAL
  Filled 2016-09-18: qty 1

## 2016-09-18 MED ORDER — ZOLPIDEM TARTRATE 5 MG PO TABS: 5.0000 mg | ORAL_TABLET | Freq: Every evening | ORAL | Status: DC | PRN

## 2016-09-18 MED ORDER — ONDANSETRON HCL 4 MG/2ML IJ SOLN
4.0000 mg | Freq: Once | INTRAMUSCULAR | Status: AC
  Administered 2016-09-18: 4 mg via INTRAVENOUS
  Filled 2016-09-18: qty 2

## 2016-09-18 MED ORDER — HYDROCODONE-ACETAMINOPHEN 10-325 MG PO TABS
1.0000 | ORAL_TABLET | Freq: Three times a day (TID) | ORAL | Status: DC | PRN
  Administered 2016-09-19: 1 via ORAL
  Filled 2016-09-18: qty 1

## 2016-09-18 MED ORDER — ALBUTEROL SULFATE (2.5 MG/3ML) 0.083% IN NEBU
2.5000 mg | INHALATION_SOLUTION | RESPIRATORY_TRACT | Status: DC | PRN
  Administered 2016-09-19: 2.5 mg via RESPIRATORY_TRACT
  Filled 2016-09-18: qty 3

## 2016-09-18 MED ORDER — NITROGLYCERIN 0.4 MG SL SUBL
0.4000 mg | SUBLINGUAL_TABLET | SUBLINGUAL | Status: DC | PRN
  Administered 2016-09-18 (×3): 0.4 mg via SUBLINGUAL
  Filled 2016-09-18: qty 1

## 2016-09-18 MED ORDER — ONDANSETRON HCL 4 MG/2ML IJ SOLN: 4.0000 mg | Freq: Three times a day (TID) | INTRAMUSCULAR | Status: DC | PRN

## 2016-09-18 MED ORDER — DM-GUAIFENESIN ER 30-600 MG PO TB12
1.0000 | ORAL_TABLET | Freq: Two times a day (BID) | ORAL | Status: DC
  Administered 2016-09-18 – 2016-09-19 (×2): 1 via ORAL
  Filled 2016-09-18 (×2): qty 1

## 2016-09-18 MED ORDER — INSULIN GLARGINE 100 UNIT/ML ~~LOC~~ SOLN
40.0000 [IU] | Freq: Every day | SUBCUTANEOUS | Status: DC
  Administered 2016-09-19: 40 [IU] via SUBCUTANEOUS
  Filled 2016-09-18 (×2): qty 0.4

## 2016-09-18 MED ORDER — INSULIN GLARGINE 100 UNIT/ML ~~LOC~~ SOLN
40.0000 [IU] | Freq: Every day | SUBCUTANEOUS | Status: DC
  Filled 2016-09-18: qty 0.4

## 2016-09-18 MED ORDER — ALPRAZOLAM 0.5 MG PO TABS
0.5000 mg | ORAL_TABLET | Freq: Two times a day (BID) | ORAL | Status: DC | PRN
  Administered 2016-09-19: 0.5 mg via ORAL
  Filled 2016-09-18: qty 1

## 2016-09-18 MED ORDER — TICAGRELOR 90 MG PO TABS: 90.0000 mg | ORAL_TABLET | Freq: Two times a day (BID) | ORAL | Status: DC

## 2016-09-18 MED ORDER — METRONIDAZOLE 500 MG PO TABS
500.0000 mg | ORAL_TABLET | Freq: Two times a day (BID) | ORAL | Status: DC
  Administered 2016-09-18 – 2016-09-19 (×2): 500 mg via ORAL
  Filled 2016-09-18 (×2): qty 1

## 2016-09-18 MED ORDER — ASPIRIN EC 81 MG PO TBEC: 81.0000 mg | DELAYED_RELEASE_TABLET | Freq: Every day | ORAL | Status: DC

## 2016-09-18 MED ORDER — BIOTIN 1000 MCG PO TABS: 1000.0000 ug | ORAL_TABLET | Freq: Two times a day (BID) | ORAL | Status: DC

## 2016-09-18 MED ORDER — EZETIMIBE 10 MG PO TABS
10.0000 mg | ORAL_TABLET | Freq: Every day | ORAL | Status: DC
  Administered 2016-09-19: 10 mg via ORAL
  Filled 2016-09-18: qty 1

## 2016-09-18 MED ORDER — ENOXAPARIN SODIUM 40 MG/0.4ML ~~LOC~~ SOLN
40.0000 mg | Freq: Every day | SUBCUTANEOUS | Status: DC
  Administered 2016-09-18: 40 mg via SUBCUTANEOUS
  Filled 2016-09-18: qty 0.4

## 2016-09-18 MED ORDER — NICOTINE 21 MG/24HR TD PT24
21.0000 mg | MEDICATED_PATCH | Freq: Every day | TRANSDERMAL | Status: DC
  Filled 2016-09-18: qty 1

## 2016-09-18 MED ORDER — ATORVASTATIN CALCIUM 80 MG PO TABS
80.0000 mg | ORAL_TABLET | Freq: Every day | ORAL | Status: DC
  Administered 2016-09-18: 80 mg via ORAL
  Filled 2016-09-18: qty 1

## 2016-09-18 MED ORDER — CLOPIDOGREL BISULFATE 75 MG PO TABS
75.0000 mg | ORAL_TABLET | Freq: Every day | ORAL | Status: DC
  Administered 2016-09-19: 75 mg via ORAL
  Filled 2016-09-18: qty 1

## 2016-09-18 MED ORDER — SODIUM CHLORIDE 0.9 % IV SOLN: INTRAVENOUS | Status: DC

## 2016-09-18 NOTE — ED Provider Notes (Signed)
Patient awakened 5:30 PM today complaining of anterior chest pain described as pressure and heaviness across her anterior chest. She's also been coughing for for the past few days and feels the pain is worse with coughing. Treated with sublingual nitroglycerin while here with improvement. On exam alert no distress coughing frequently   Orlie Dakin, MD 09/19/16 0000

## 2016-09-18 NOTE — ED Notes (Signed)
Patient transported to X-ray 

## 2016-09-18 NOTE — Progress Notes (Signed)
PHARMACIST - PHYSICIAN ORDER COMMUNICATION  CONCERNING: P&T Medication Policy on Herbal Medications  DESCRIPTION:  This patient's order for:  Biotin  has been noted.  This product(s) is classified as an "herbal" or natural product. Due to a lack of definitive safety studies or FDA approval, nonstandard manufacturing practices, plus the potential risk of unknown drug-drug interactions while on inpatient medications, the Pharmacy and Therapeutics Committee does not permit the use of "herbal" or natural products of this type within [facility].   ACTION TAKEN: The pharmacy department is unable to verify this order at this time and your patient has been informed of this safety policy. Please reevaluate patient's clinical condition at discharge and address if the herbal or natural product(s) should be resumed at that time.  Sherlon Handing, PharmD, BCPS Clinical pharmacist, pager [phone] 09/18/2016 10:20 PM

## 2016-09-18 NOTE — H&P (Signed)
History and Physical    Maureen Price SHF:[id number] DOB: October 10, 1975 DOA: 09/18/2016  Referring MD/NP/PA:   PCP: [name], MD   Patient coming from:  The patient is coming from home.  At baseline, pt is independent for most of ADL.  Chief Complaint: Chest pain and cough  HPI: Maureen Price is a 41 y.o. female with medical history significant of diabetes mellitus, hyperlipidemia, asthma, GERD, depression, anxiety, tobacco abuse, CAD, STEMI, s/p of stent placement in 2015, who presents with chest pain and cough.  Patient states that her chest pain started at 5:30. It is located in the substernal area, 9 out of 10 in severity, pressure-like, nonradiating. Chest is not pleuritic. No calf tenderness. It is associated with nausea, no vomiting, diarrhea or abdominal pain. Patient was given aspirin and nitroglycerin, her chest pain subsided now. She also has dry cough started today. She had mild shortness of breath earlier, which has resolved. No runny nose or sore throat. No wheezing. Patient denies symptoms of UTI or unilateral weakness.  ED Course: pt was found to have negative troponin, WBC 9.2, electrolytes and renal function okay, temperature normal, no tachycardia, oxygen saturation normal, x-ray showed mild vascular congestion. Pt is placed on tele bed for obs.  Review of Systems:   General: no fevers, chills, no changes in body weight, has fatigue HEENT: no blurry vision, hearing changes or sore throat Respiratory: has dyspnea, coughing, no wheezing CV: no chest pain, no palpitations GI: no nausea, vomiting, abdominal pain, diarrhea, constipation GU: no dysuria, burning on urination, increased urinary frequency, hematuria  Ext: no leg edema Neuro: no unilateral weakness, numbness, or tingling, no vision change or hearing loss Skin: no rash, no skin tear. MSK: No muscle spasm, no deformity, no limitation of range of movement in spin Heme: No easy bruising.  Travel  history: No recent long distant travel.  Allergy:  Allergies  Allergen Reactions  . Erythromycin Anaphylaxis  . Penicillins Anaphylaxis  . Metformin And Related Other (See Comments)    diarrhea  . Viibryd [Vilazodone Hcl] Other (See Comments)    Altered mental status.      Past Medical History:  Diagnosis Date  . Acute myocardial infarction of other anterior wall, initial episode of care   . Anginal pain ([place])   . Anxiety   . Arthritis   . Asthma   . CAD (coronary artery disease)   . CHF (congestive heart failure) ([place])   . Depression   . Diabetes mellitus    2002; no meds since 2004  . Dyslipidemia, goal LDL below 70 10/26/2013  . Dysrhythmia   . GERD (gastroesophageal reflux disease)   . Headache(784.0)   . Obesity   . Paroxysmal ventricular tachycardia ([place])   . Shortness of breath   . STEMI (ST elevation myocardial infarction) ([place])   . Tobacco abuse     Past Surgical History:  Procedure Laterality Date  . CESAREAN SECTION    . CESAREAN SECTION  1994,1997,2000  . CHOLECYSTECTOMY    . CORONARY ANGIOPLASTY WITH STENT PLACEMENT    . GALLBLADDER SURGERY    . LEFT HEART CATH N/A 10/24/2013   Procedure: LEFT HEART CATH;  Surgeon: Lorretta Harp, MD;  Location[facility] CATH LAB;  Service: Cardiovascular;  Laterality: N/A;  . LEFT HEART CATHETERIZATION WITH CORONARY ANGIOGRAM N/A 10/26/2013   Procedure: LEFT HEART CATHETERIZATION WITH CORONARY ANGIOGRAM;  Surgeon: Burnell Blanks, MD;  Location: [facility] CATH LAB;  Service: Cardiovascular;  Laterality: N/A;  .  LEFT HEART CATHETERIZATION WITH CORONARY ANGIOGRAM N/A 03/29/2014   Procedure: LEFT HEART CATHETERIZATION WITH CORONARY ANGIOGRAM;  Surgeon: Lorretta Harp, MD;  Location: [facility] CATH LAB;  Service: Cardiovascular;  Laterality: N/A;  . TUBAL LIGATION     2000    Social History:  reports that she has been smoking Cigarettes.  She has a 10.00 pack-year smoking history. She has never used smokeless tobacco. She reports  that she does not drink alcohol or use drugs.  Family History:  Family History  Problem Relation Age of Onset  . Arthritis Mother   . Diabetes Mother   . Heart disease Father   . Stroke Father   . Diabetes Maternal Grandfather   . Cancer Neg Hx   . Alcohol abuse Neg Hx   . Early death Neg Hx   . Kidney disease Neg Hx   . Hypertension Neg Hx   . Hyperlipidemia Neg Hx      Prior to Admission medications   Medication Sig Start Date End Date Taking? Authorizing Provider  aspirin EC 81 MG tablet Take 81 mg by mouth daily. Reported on 09/20/2015    Historical Provider, MD  atorvastatin (LIPITOR) 80 MG tablet Take 1 tablet (80 mg total) by mouth daily. 10/08/15   Lorretta Harp, MD  Biotin 1000 MCG tablet Take 1,000 mcg by mouth 2 (two) times daily. Reported on 09/20/2015    Historical Provider, MD  Blood Glucose Monitoring Suppl ([place]) w/Device KIT 1 Act by Does not apply route 3 (three) times daily. 05/20/16   [name], MD  canagliflozin (INVOKANA) 300 MG TABS tablet Take 1 tablet (300 mg total) by mouth daily. 05/20/16   [name], MD  clopidogrel (PLAVIX) 75 MG tablet Take 1 tablet (75 mg total) by mouth daily. 11/06/15   Lorretta Harp, MD  ezetimibe (ZETIA) 10 MG tablet Take 1 tablet (10 mg total) by mouth daily. 05/21/16   [name], MD  glucose blood test strip Use TID OneTouch Verio 05/20/16   [name], MD  Insulin Glargine (TOUJEO SOLOSTAR) 300 UNIT/ML SOPN Inject 50 Units into the skin daily. 05/20/16   [name], MD  Insulin Pen Needle 32G X 5 MM MISC Use pen needle with victoza insulin injector as directed by your physician. 01/02/15   [name], MD  Liraglutide (VICTOZA) 18 MG/3ML SOPN Inject 0.3 mLs (1.8 mg total) into the skin daily. 05/20/16   [name], MD  metroNIDAZOLE (FLAGYL) 500 MG tablet Take 1 tablet (500 mg total) by mouth 2 (two) times daily. 08/05/16   Salvadore Dom, MD  nitroGLYCERIN (NITROSTAT) 0.4 MG SL  tablet Place 0.4 mg under the tongue every 5 (five) minutes as needed for chest pain. Reported on 09/20/2015    Historical Provider, MD  Omega-3 Fatty Acids (FISH OIL PO) Take 1 tablet by mouth daily.    Historical Provider, MD  potassium chloride SA (K-DUR,KLOR-CON) 20 MEQ tablet Take 1 tablet (20 mEq total) by mouth daily. 01/03/15   [name], MD  ticagrelor (BRILINTA) 90 MG TABS tablet Take 1 tablet (90 mg total) by mouth 2 (two) times daily. 11/06/15   Lorretta Harp, MD    Physical Exam: Vitals:   09/18/16 2115 09/18/16 2130 09/18/16 2145 09/18/16 2200  BP: 109/61 123/66 106/66 125/75  Pulse: 90 82 84 81  Resp: _0 Temp:      TempSrc:  SpO2: 98% 99% 98% 100%  Weight:      Height:       General: Not in acute distress HEENT:       Eyes: PERRL, EOMI, no scleral icterus.       ENT: No discharge from the ears and nose, no pharynx injection, no tonsillar enlargement.        Neck: No JVD, no bruit, no mass felt. Heme: No neck lymph node enlargement. Cardiac: S1/S2, RRR, No murmurs, No gallops or rubs. Respiratory: No rales, wheezing, rhonchi or rubs. GI: Soft, nondistended, nontender, no rebound pain, no organomegaly, BS present. GU: No hematuria Ext: No pitting leg edema bilaterally. 2+DP/PT pulse bilaterally. Musculoskeletal: No joint deformities, No joint redness or warmth, no limitation of ROM in spin. Skin: No rashes.  Neuro: Alert, oriented X3, cranial nerves II-XII grossly intact, moves all extremities normally.  Psych: Patient is not psychotic, no suicidal or hemocidal ideation.  Labs on Admission: I have personally reviewed following labs and imaging studies  CBC:  Recent Labs Lab 09/18/16 1912  WBC 9.2  HGB 14.0  HCT 39.5  MCV 78.8  PLT 856   Basic Metabolic Panel:  Recent Labs Lab 09/18/16 1912  NA 137  K 3.6  CL 103  CO2 24  GLUCOSE 321*  BUN <5*  CREATININE 0.53  CALCIUM 9.1   GFR: Estimated Creatinine Clearance: 108.5 mL/min  (by C-G formula based on SCr of 0.53 mg/dL). Liver Function Tests: No results for input(s): AST, ALT, ALKPHOS, BILITOT, PROT, ALBUMIN in the last 168 hours. No results for input(s): LIPASE, AMYLASE in the last 168 hours. No results for input(s): AMMONIA in the last 168 hours. Coagulation Profile: No results for input(s): INR, PROTIME in the last 168 hours. Cardiac Enzymes: No results for input(s): CKTOTAL, CKMB, CKMBINDEX, TROPONINI in the last 168 hours. BNP (last 3 results) No results for input(s): PROBNP in the last 8760 hours. HbA1C: No results for input(s): HGBA1C in the last 72 hours. CBG: No results for input(s): GLUCAP in the last 168 hours. Lipid Profile: No results for input(s): CHOL, HDL, LDLCALC, TRIG, CHOLHDL, LDLDIRECT in the last 72 hours. Thyroid Function Tests: No results for input(s): TSH, T4TOTAL, FREET4, T3FREE, THYROIDAB in the last 72 hours. Anemia Panel: No results for input(s): VITAMINB12, FOLATE, FERRITIN, TIBC, IRON, RETICCTPCT in the last 72 hours. Urine analysis:    Component Value Date/Time   COLORURINE YELLOW 05/20/2016 [place] 05/20/2016 1347   LABSPEC <=1.005 (A) 05/20/2016 1347   PHURINE 6.0 05/20/2016 1347   GLUCOSEU >=1000 (A) 05/20/2016 1347   HGBUR NEGATIVE 05/20/2016 1347   BILIRUBINUR n 06/15/2016 1125   KETONESUR NEGATIVE 05/20/2016 1347   PROTEINUR n 06/15/2016 1125   PROTEINUR NEGATIVE 01/21/2015 0740   UROBILINOGEN negative 06/15/2016 1125   UROBILINOGEN 0.2 05/20/2016 1347   NITRITE n 06/15/2016 1125   NITRITE NEGATIVE 05/20/2016 1347   LEUKOCYTESUR Negative 06/15/2016 1125   Sepsis Labs: _0 (procalcitonin:4,lacticidven:4) )No results found for this or any previous visit (from the past 240 hour(s)).   Radiological Exams on Admission: Dg Chest 2 View  Result Date: 09/18/2016 CLINICAL DATA:  Acute onset of nausea, lightheadedness, and shortness of breath. Acute onset of generalized chest pain. Initial  encounter. EXAM: CHEST  2 VIEW COMPARISON:  Chest radiograph performed 03/18/2016 FINDINGS: The lungs are well-aerated. Mild vascular congestion is noted. There is no evidence of focal opacification, pleural effusion or pneumothorax. The heart is normal in size; the mediastinal contour is within  normal limits. No acute osseous abnormalities are seen. Clips are noted within the right upper quadrant, reflecting prior cholecystectomy. IMPRESSION: Mild vascular congestion noted.  Lungs remain grossly clear. Electronically Signed   By: Garald Balding M.D.   On: 09/18/2016 20:02     EKG: Independently reviewed.  Sinus rhythm, QTC 417, low voltage in precordial leads, anteroseptal infarction pattern.   Assessment/Plan Principal Problem:   Chest pain Active Problems:   Diabetes mellitus type 2, controlled, with complications (HCC)   Dyslipidemia, goal LDL below 70   Coronary artery disease   Tobacco use disorder   Chest pain at rest   Asthma   Chest pain and hx of CAD: Chest x-ray has no infiltration. Patient has low risk for DVT or PE. Given hx of STEMI and s/p of stent, will do chest pain r/o work up. Initial troponin negative.  - will place on Tele bed for obs - cycle CE q6 x3 and repeat her EKG in the am  - Nitroglycerin, Morphine, and aspirin, lipitor, Zetia, plavix and brilinta - Risk factor stratification: will check FLP, UDS and A1C  - 2d echo - please call Card in AM  DM-II: Last A1c 9.9 on 9/1/317, poorly controled. Patient is taking  Victoza, Invokana and glargine insulin at home. Blood sugar 321. -will decrease glargine insulin dose from  50-->40 units daily -SSI  HLD: Last LDL was 124 on 05/20/16, not at goal -Continue home medications: Zetia and Lipitor -Check FLP  Tobacco abuse: -Did counseling about importance of quitting smoking -Nicotine patch  Asthma: pt has cough, but no wheezing. CXR has not infiltration. -When necessary albuterol nebulizer -Mucinex for  cough -Follow up flu PCR which is ordered by EDP.  BV: -on Flagyl   DVT ppx: SQ Lovenox Code Status: Full code Family Communication: None at bed side.   Disposition Plan:  Anticipate discharge back to previous home environment Consults called:  none Admission status: Obs / tele      Date of Service 09/18/2016    Ivor Costa [facility] Pager [phone]  If 7PM-7AM, please contact night-coverage [email or web address] Password [facility] 09/18/2016, 10:07 PM

## 2016-09-18 NOTE — ED Provider Notes (Signed)
[facility] DEPT Provider Note   CSN: [id number] Arrival date & time: 09/18/16  1817     History   Chief Complaint Chief Complaint  Patient presents with  . Chest Pain    HPI Maureen Price is a 41 y.o. female.  The history is provided by the patient.  Chest Pain   This is a new problem. The current episode started 3 to 5 hours ago. The problem occurs constantly. The problem has been gradually improving. The pain is associated with rest. The pain is present in the substernal region. The pain is at a severity of 9/10. The quality of the pain is described as heavy and pressure-like. The pain does not radiate. Duration of episode(s) is 5 hours. Associated symptoms include cough, diaphoresis, nausea, shortness of breath and vomiting. Pertinent negatives include no abdominal pain, no dizziness, no fever, no lower extremity edema, no near-syncope, no numbness, no palpitations, no sputum production, no syncope and no weakness. Treatments tried: aspirin. The treatment provided mild relief. Risk factors include obesity (hx of CAD with stent in LAD, DM, obesity, smoker).  Her past medical history is significant for CAD, diabetes and hyperlipidemia.  Her family medical history is significant for heart disease.    Past Medical History:  Diagnosis Date  . Acute myocardial infarction of other anterior wall, initial episode of care   . Anginal pain ([place])   . Anxiety   . Arthritis   . Asthma   . CAD (coronary artery disease)   . CHF (congestive heart failure) ([place])   . Depression   . Diabetes mellitus    2002; no meds since 2004  . Dyslipidemia, goal LDL below 70 10/26/2013  . Dysrhythmia   . GERD (gastroesophageal reflux disease)   . Headache(784.0)   . Obesity   . Paroxysmal ventricular tachycardia ([place])   . Shortness of breath   . STEMI (ST elevation myocardial infarction) ([place])   . Tobacco abuse     Patient Active Problem List   Diagnosis Date Noted  . Chest pain at rest  09/18/2016  . Asthma 09/18/2016  . Screening for cervical cancer 05/20/2016  . DDD (degenerative disc disease), lumbar 01/02/2015  . GAD (generalized anxiety disorder) 06/14/2014  . Atherosclerosis of native artery of extremity with intermittent claudication ([place]) 03/05/2014  . Tobacco use disorder 03/05/2014  . Coronary artery disease 11/06/2013  . Dyslipidemia, goal LDL below 70 10/26/2013  . Obesity   . STEMI (ST elevation myocardial infarction), PTCA/Stent DES- [facility] [place]- LAD, large burden thrombus in LAD and RCA  10/24/2013  . Chest pain 06/27/2013  . Diabetes mellitus type 2, controlled, with complications ([place]) 82/64/1583  . DJD (degenerative joint disease) of knee 07/07/2012    Past Surgical History:  Procedure Laterality Date  . CESAREAN SECTION    . CESAREAN SECTION  1994,1997,2000  . CHOLECYSTECTOMY    . CORONARY ANGIOPLASTY WITH STENT PLACEMENT    . GALLBLADDER SURGERY    . LEFT HEART CATH N/A 10/24/2013   Procedure: LEFT HEART CATH;  Surgeon: Lorretta Harp, MD;  Location: [facility] C[facility] LAB;  Service: Cardiovascular;  Laterality: N/A;  . LEFT HEART CATHETERIZATION WITH CORONARY ANGIOGRAM N/A 10/26/2013   Procedure: LEFT HEART CATHETERIZATION WITH CORONARY ANGIOGRAM;  Surgeon: Burnell Blanks, MD;  Location: [facility] CATH LAB;  Service: Cardiovascular;  Laterality: N/A;  . LEFT HEART CATHETERIZATION WITH CORONARY ANGIOGRAM N/A 03/29/2014   Procedure: LEFT HEART CATHETERIZATION WITH CORONARY ANGIOGRAM;  Surgeon: Lorretta Harp, MD;  Location:  [facility] [facility] CATH LAB;  Service: Cardiovascular;  Laterality: N/A;  . TUBAL LIGATION     2000    OB History    Gravida Para Term Preterm AB Living   4 3 3   1 3    SAB TAB Ectopic Multiple Live Births   1               Home Medications    Prior to Admission medications   Medication Sig Start Date End Date Taking? Authorizing Provider  aspirin EC 81 MG tablet Take 81 mg by mouth daily. Reported on 09/20/2015   Yes Historical Provider,  MD  atorvastatin (LIPITOR) 80 MG tablet Take 1 tablet (80 mg total) by mouth daily. 10/08/15  Yes Lorretta Harp, MD  Biotin 1000 MCG tablet Take 1,000 mcg by mouth 2 (two) times daily. Reported on 09/20/2015   Yes Historical Provider, MD  Blood Glucose Monitoring Suppl ([place]) w/Device KIT 1 Act by Does not apply route 3 (three) times daily. 05/20/16  Yes [name], MD  clopidogrel (PLAVIX) 75 MG tablet Take 1 tablet (75 mg total) by mouth daily. 11/06/15  Yes Lorretta Harp, MD  glucose blood test strip Use TID OneTouch Verio 05/20/16  Yes [name], MD  Insulin Glargine (TOUJEO SOLOSTAR) 300 UNIT/ML SOPN Inject 50 Units into the skin daily. 05/20/16  Yes [name], MD  metroNIDAZOLE (FLAGYL) 500 MG tablet Take 1 tablet (500 mg total) by mouth 2 (two) times daily. 08/05/16  Yes Salvadore Dom, MD  Omega-3 Fatty Acids (FISH OIL PO) Take 1 tablet by mouth daily.   Yes Historical Provider, MD  potassium chloride SA (K-DUR,KLOR-CON) 20 MEQ tablet Take 1 tablet (20 mEq total) by mouth daily. 01/03/15  Yes [name], MD  ezetimibe (ZETIA) 10 MG tablet Take 1 tablet (10 mg total) by mouth daily. Patient not taking: Reported on 09/18/2016 05/21/16   [name], MD  Insulin Pen Needle 32G X 5 MM MISC Use pen needle with victoza insulin injector as directed by your physician. 01/02/15   [name], MD  Liraglutide (VICTOZA) 18 MG/3ML SOPN Inject 0.3 mLs (1.8 mg total) into the skin daily. Patient not taking: Reported on 09/18/2016 05/20/16   [name], MD  nitroGLYCERIN (NITROSTAT) 0.4 MG SL tablet Place 0.4 mg under the tongue every 5 (five) minutes as needed for chest pain. Reported on 09/20/2015    Historical Provider, MD  ticagrelor (BRILINTA) 90 MG TABS tablet Take 1 tablet (90 mg total) by mouth 2 (two) times daily. Patient not taking: Reported on 09/18/2016 11/06/15   Lorretta Harp, MD    Family History Family History  Problem Relation Age of  Onset  . Arthritis Mother   . Diabetes Mother   . Heart disease Father   . Stroke Father   . Diabetes Maternal Grandfather   . Cancer Neg Hx   . Alcohol abuse Neg Hx   . Early death Neg Hx   . Kidney disease Neg Hx   . Hypertension Neg Hx   . Hyperlipidemia Neg Hx     Social History Social History  Substance Use Topics  . Smoking status: Current Every Day Smoker    Packs/day: 0.50    Years: 20.00    Types: Cigarettes  . Smokeless tobacco: Never Used     Comment: 3 cigs/day  . Alcohol use No     Comment: occasional  Allergies   Erythromycin; Penicillins; Metformin and related; and Viibryd [vilazodone hcl]   Review of Systems Review of Systems  Constitutional: Positive for diaphoresis. Negative for fever.  HENT: Negative.   Respiratory: Positive for cough and shortness of breath. Negative for sputum production.   Cardiovascular: Positive for chest pain. Negative for palpitations, syncope and near-syncope.  Gastrointestinal: Positive for nausea and vomiting. Negative for abdominal pain.  Genitourinary: Negative.   Musculoskeletal: Negative.   Neurological: Negative for dizziness, weakness and numbness.     Physical Exam Updated Vital Signs BP 138/81 (BP Location: Right Arm)   Pulse 85   Temp 98.5 F (36.9 C) (Oral)   Resp 17   Ht 5' 4"  (1.626 m)   Wt 102.9 kg   LMP 08/31/2016 (Exact Date)   SpO2 96%   BMI 38.95 kg/m   Physical Exam  Constitutional: She appears well-developed and well-nourished. She appears distressed.  Obese female  HENT:  Head: Normocephalic and atraumatic.  Eyes: Conjunctivae are normal.  Neck: Neck supple.  Cardiovascular: Normal rate, regular rhythm, S1 normal, S2 normal, normal heart sounds, intact distal pulses and normal pulses.  Exam reveals no gallop and no friction rub.   No murmur heard. Pulmonary/Chest: Effort normal. No respiratory distress. She has decreased breath sounds in the right lower field and the left lower  field. She has no rhonchi.  Abdominal: Soft. There is no tenderness.  Musculoskeletal: She exhibits no edema.  Neurological: She is alert.  Skin: Skin is warm and dry.  Psychiatric: She has a normal mood and affect.  Nursing note and vitals reviewed.    ED Treatments / Results  Labs (all labs ordered are listed, but only abnormal results are displayed) Labs Reviewed  BASIC METABOLIC PANEL - Abnormal; Notable for the following:       Result Value   Glucose, Bld 321 (*)    BUN <5 (*)    All other components within normal limits  GLUCOSE, CAPILLARY - Abnormal; Notable for the following:    Glucose-Capillary 381 (*)    All other components within normal limits  CBC  INFLUENZA PANEL BY PCR (TYPE A & B, H1N1)  RAPID URINE DRUG SCREEN, HOSP PERFORMED  TROPONIN I  HEMOGLOBIN A1C  LIPID PANEL  TROPONIN I  TROPONIN I  I-STAT TROPOININ, ED    EKG  EKG Interpretation  Date/Time:  Friday September 18 2016 18:25:37 EST Ventricular Rate:  96 PR Interval:    QRS Duration: 93 QT Interval:  330 QTC Calculation: 417 R Axis:   1 Text Interpretation:  Sinus rhythm Anterior infarct, old Borderline T abnormalities, inferior leads No significant change since last tracing Confirmed by Winfred Leeds  MD, SAM [phone]) on 09/18/2016 6:35:53 PM       Radiology Dg Chest 2 View  Result Date: 09/18/2016 CLINICAL DATA:  Acute onset of nausea, lightheadedness, and shortness of breath. Acute onset of generalized chest pain. Initial encounter. EXAM: CHEST  2 VIEW COMPARISON:  Chest radiograph performed 03/18/2016 FINDINGS: The lungs are well-aerated. Mild vascular congestion is noted. There is no evidence of focal opacification, pleural effusion or pneumothorax. The heart is normal in size; the mediastinal contour is within normal limits. No acute osseous abnormalities are seen. Clips are noted within the right upper quadrant, reflecting prior cholecystectomy. IMPRESSION: Mild vascular congestion noted.   Lungs remain grossly clear. Electronically Signed   By: Garald Balding M.D.   On: 09/18/2016 20:02    Procedures Procedures (including critical care time)  Medications Ordered in ED Medications  nitroGLYCERIN (NITROSTAT) SL tablet 0.4 mg (0.4 mg Sublingual Given 09/18/16 2059)  ondansetron (ZOFRAN) injection 4 mg (not administered)  ezetimibe (ZETIA) tablet 10 mg (not administered)  atorvastatin (LIPITOR) tablet 80 mg (80 mg Oral Given 09/18/16 2322)  clopidogrel (PLAVIX) tablet 75 mg (not administered)  omega-3 acid ethyl esters (LOVAZA) capsule 1 g (not administered)  aspirin EC tablet 81 mg (not administered)  albuterol (PROVENTIL) (2.5 MG/3ML) 0.083% nebulizer solution 2.5 mg (2.5 mg Nebulization Given 09/19/16 0052)  dextromethorphan-guaiFENesin (MUCINEX DM) 30-600 MG per 12 hr tablet 1 tablet (1 tablet Oral Given 09/18/16 2322)  nicotine (NICODERM CQ - dosed in mg/24 hours) patch 21 mg (not administered)  acetaminophen (TYLENOL) tablet 650 mg (not administered)  enoxaparin (LOVENOX) injection 40 mg (40 mg Subcutaneous Given 09/18/16 2321)  zolpidem (AMBIEN) tablet 5 mg (not administered)  metroNIDAZOLE (FLAGYL) tablet 500 mg (500 mg Oral Given 09/18/16 2322)  insulin aspart (novoLOG) injection 0-9 Units (9 Units Subcutaneous Given 09/18/16 2322)  HYDROcodone-acetaminophen (NORCO) 10-325 MG per tablet 1 tablet (1 tablet Oral Given 09/19/16 0014)  insulin glargine (LANTUS) injection 40 Units (40 Units Subcutaneous Given 09/19/16 0038)  ALPRAZolam (XANAX) tablet 0.5 mg (0.5 mg Oral Given 09/19/16 0014)  ondansetron (ZOFRAN) injection 4 mg (4 mg Intravenous Given 09/18/16 2002)     Initial Impression / Assessment and Plan / ED Course  I have reviewed the triage vital signs and the nursing notes.  Pertinent labs & imaging results that were available during my care of the patient were reviewed by me and considered in my medical decision making (see chart for details).  Clinical Course     41 year old female with a history of CAD with stent in the LAD presenting with chest pain that woke her up out of sleep at approximately 3:30. She is cause the pain as a pressure sensation in the middle of her chest. She states this feels similar to her past MI but not as severe. She has had nausea and diaphoresis. EMS gave aspirin and her pain went from a 9 out of 10 to a 6 out of 10. EKG with no significant changes from prior. Patient given nitroglycerin for chest pain which resolved her chest pain. Chest x-ray shows mild vascular congestion with no focal opacities, pneumothorax, widened mediastinum. CBC unremarkable. BMP with hyperglycemia with no signs of acidosis or anion gap. Initial troponin negative. Patient is not hypertensive with equal pulses bilaterally with no widened mediastinum on chest x-ray, doubt dissection. No signs of DVT on exam or right heart strain on EKG and chest pain is not pleuritic and she is not tachycardic, doubt PE. No signs of pneumonia or pneumothorax on chest x-ray. Chest pain most consistent with ACS. Patient will be admitted for ACS rule out.   Final Clinical Impressions(s) / ED Diagnoses   Final diagnoses:  Chest pain at rest    New Prescriptions Current Discharge Medication List       Maureen Price Elijiah Mickley, MD 09/19/16 [place], MD 09/19/16 1642

## 2016-09-18 NOTE — ED Notes (Signed)
Pt enroute to inpatient,  No addl lab draw

## 2016-09-18 NOTE — ED Triage Notes (Signed)
Pt arrived via EMS from home. Pt states she was "napping" when she woke up with 9/10 cp. Pt reports MI in 2016.  Pt reports nausea and lightheaded, and cough that began today; denies SOB. Pt received 324 aspirin in route with EMS; pt cp 6/10. Pt a&o x4; resp e/u; NAD noted at this time.

## 2016-09-18 NOTE — Progress Notes (Signed)
Patient admitted from Ed with Chest pain. Alert and oriented. Vitals stable. Oriented to room and surroundings.

## 2016-09-19 ENCOUNTER — Encounter (HOSPITAL_COMMUNITY): Payer: Self-pay | Admitting: *Deleted

## 2016-09-19 ENCOUNTER — Observation Stay (HOSPITAL_COMMUNITY): Payer: BLUE CROSS/BLUE SHIELD

## 2016-09-19 ENCOUNTER — Observation Stay (HOSPITAL_BASED_OUTPATIENT_CLINIC_OR_DEPARTMENT_OTHER): Payer: BLUE CROSS/BLUE SHIELD

## 2016-09-19 DIAGNOSIS — R079 Chest pain, unspecified: Secondary | ICD-10-CM

## 2016-09-19 DIAGNOSIS — I200 Unstable angina: Secondary | ICD-10-CM

## 2016-09-19 DIAGNOSIS — Z794 Long term (current) use of insulin: Secondary | ICD-10-CM

## 2016-09-19 DIAGNOSIS — F17200 Nicotine dependence, unspecified, uncomplicated: Secondary | ICD-10-CM

## 2016-09-19 DIAGNOSIS — I214 Non-ST elevation (NSTEMI) myocardial infarction: Secondary | ICD-10-CM | POA: Diagnosis not present

## 2016-09-19 DIAGNOSIS — E669 Obesity, unspecified: Secondary | ICD-10-CM | POA: Diagnosis not present

## 2016-09-19 DIAGNOSIS — I2510 Atherosclerotic heart disease of native coronary artery without angina pectoris: Secondary | ICD-10-CM | POA: Diagnosis not present

## 2016-09-19 DIAGNOSIS — J45909 Unspecified asthma, uncomplicated: Secondary | ICD-10-CM | POA: Diagnosis not present

## 2016-09-19 DIAGNOSIS — R072 Precordial pain: Secondary | ICD-10-CM | POA: Diagnosis not present

## 2016-09-19 DIAGNOSIS — E118 Type 2 diabetes mellitus with unspecified complications: Secondary | ICD-10-CM

## 2016-09-19 DIAGNOSIS — E785 Hyperlipidemia, unspecified: Secondary | ICD-10-CM | POA: Diagnosis not present

## 2016-09-19 LAB — RAPID URINE DRUG SCREEN, HOSP PERFORMED
AMPHETAMINES: NOT DETECTED
Barbiturates: NOT DETECTED
Benzodiazepines: NOT DETECTED
Cocaine: NOT DETECTED
Opiates: NOT DETECTED
Tetrahydrocannabinol: NOT DETECTED

## 2016-09-19 LAB — NM MYOCAR MULTI W/SPECT W/WALL MOTION / EF
Peak HR: 105 {beats}/min
Rest HR: 81 {beats}/min

## 2016-09-19 LAB — ECHOCARDIOGRAM COMPLETE
Height: 64 in
WEIGHTICAEL: 3630.4 [oz_av]

## 2016-09-19 LAB — TROPONIN I
Troponin I: 0.04 ng/mL (ref <0.03)
Troponin I: <0.03 ng/mL (ref <0.03)

## 2016-09-19 LAB — GLUCOSE, CAPILLARY
Glucose-Capillary: 240 mg/dL — ABNORMAL HIGH (ref 65–99)
Glucose-Capillary: 166 mg/dL — ABNORMAL HIGH (ref 65–99)
Glucose-Capillary: 291 mg/dL — ABNORMAL HIGH (ref 65–99)
Glucose-Capillary: 333 mg/dL — ABNORMAL HIGH (ref 65–99)

## 2016-09-19 LAB — LIPID PANEL
Cholesterol: 150 mg/dL (ref 0–200)
HDL: 22 mg/dL — AB (ref >40)
LDL CALC: 91 mg/dL (ref 0–99)
TRIGLYCERIDES: 185 mg/dL — AB (ref <150)
Total CHOL/HDL Ratio: 6.8 RATIO
VLDL: 37 mg/dL (ref 0–40)

## 2016-09-19 LAB — BRAIN NATRIURETIC PEPTIDE: B NATRIURETIC PEPTIDE 5: 12.1 pg/mL (ref 0.0–100.0)

## 2016-09-19 MED ORDER — DIPHENHYDRAMINE HCL 25 MG PO CAPS
25.0000 mg | ORAL_CAPSULE | Freq: Once | ORAL | Status: AC
  Administered 2016-09-19: 25 mg via ORAL
  Filled 2016-09-19: qty 1

## 2016-09-19 MED ORDER — REGADENOSON 0.4 MG/5ML IV SOLN
INTRAVENOUS | Status: AC
  Filled 2016-09-19: qty 5

## 2016-09-19 MED ORDER — REGADENOSON 0.4 MG/5ML IV SOLN
0.4000 mg | Freq: Once | INTRAVENOUS | Status: AC
  Administered 2016-09-19: 0.4 mg via INTRAVENOUS

## 2016-09-19 MED ORDER — FUROSEMIDE 10 MG/ML IJ SOLN
20.0000 mg | Freq: Once | INTRAMUSCULAR | Status: AC
  Administered 2016-09-19: 20 mg via INTRAVENOUS
  Filled 2016-09-19: qty 2

## 2016-09-19 MED ORDER — KCL IN DEXTROSE-NACL 20-5-0.45 MEQ/L-%-% IV SOLN
INTRAVENOUS | Status: DC
  Filled 2016-09-19: qty 1000

## 2016-09-19 MED ORDER — TECHNETIUM TC 99M TETROFOSMIN IV KIT
10.0000 | PACK | Freq: Once | INTRAVENOUS | Status: AC | PRN
  Administered 2016-09-19: 10 via INTRAVENOUS

## 2016-09-19 MED ORDER — TECHNETIUM TC 99M TETROFOSMIN IV KIT
30.0000 | PACK | Freq: Once | INTRAVENOUS | Status: AC | PRN
  Administered 2016-09-19: 30 via INTRAVENOUS

## 2016-09-19 MED ORDER — ASPIRIN EC 325 MG PO TBEC
325.0000 mg | DELAYED_RELEASE_TABLET | Freq: Every day | ORAL | Status: DC
  Administered 2016-09-19: 325 mg via ORAL
  Filled 2016-09-19: qty 1

## 2016-09-19 MED ORDER — MORPHINE SULFATE (PF) 2 MG/ML IV SOLN
2.0000 mg | INTRAVENOUS | Status: DC | PRN
Start: 2016-09-19 — End: 2016-09-19

## 2016-09-19 MED ORDER — NITROGLYCERIN 0.4 MG SL SUBL: 0.4000 mg | SUBLINGUAL_TABLET | SUBLINGUAL | 0 refills | Status: DC | PRN

## 2016-09-19 NOTE — Progress Notes (Signed)
[facility] PROGRESS NOTE  Maureen Price [id number] DOB: 1976/01/19 DOA: 09/18/2016 PCP: [name], MD  Assessment/Plan: Chest pain Patient followed by our cardiology, cardiology consult did Cardiology has been notified MetroLotion as needed Morphine as needed Daily aspirin Stress test for today Nothing by mouth, IVF ordered Repeat a.m. troponin is pending, BNP also pending A.m. x-ray shows continued vascular congestion, but clinically patient well, cough adds to suspicion, trial of lasix x1  Hyperlipidemia Continue statin  DM SSI lantus 40u, pt normally on 50  Asthma, controlled When necessary albuterol  Tobacco abuse Nicotine patch daily  Code Status: Full code Family Communication: Patient declined (indicate person spoken with, relationship, and if by phone, the number) Disposition Plan: Likely discharge this afternoon   Consultants:  Cardiology  Procedures:  Stress test  Antibiotics:  None (indicate start date, and stop date if known)  HPI/Subjective: Patient denies any fever chills shortness breath nausea vomiting abd pain.  Pos cough.  Objective: Vitals:   09/18/16 2230 09/18/16 2300  BP: 107/69 138/81  Pulse: 88 85  Resp: 12 17  Temp:  98.5 F (36.9 C)    Intake/Output Summary (Last 24 hours) at 09/19/16 0924 Last data filed at 09/19/16 0535  Gross per 24 hour  Intake                0 ml  Output             1000 ml  Net            -1000 ml   Filed Weights   09/18/16 1825 09/18/16 2300 09/18/16 2306  Weight: 101.6 kg (224 lb) 102.7 kg (226 lb 6.4 oz) 102.9 kg (226 lb 14.4 oz)    Exam:   General:  Alert and oriented 3, well-appearing  Cardiovascular: Regular rate and rhythm no murmurs rubs gallops  Respiratory: Clear to auscultation bilaterally, normal work of breathing  Abdomen: Nondistended, nontender, no rebound rigidity, bowel sounds present  Musculoskeletal: Moving all extremities   Data  Reviewed: Basic Metabolic Panel:  Recent Labs Lab 09/18/16 1912  NA 137  K 3.6  CL 103  CO2 24  GLUCOSE 321*  BUN <5*  CREATININE 0.53  CALCIUM 9.1   Liver Function Tests: No results for input(s): AST, ALT, ALKPHOS, BILITOT, PROT, ALBUMIN in the last 168 hours. No results for input(s): LIPASE, AMYLASE in the last 168 hours. No results for input(s): AMMONIA in the last 168 hours. CBC:  Recent Labs Lab 09/18/16 1912  WBC 9.2  HGB 14.0  HCT 39.5  MCV 78.8  PLT 266   Cardiac Enzymes:  Recent Labs Lab 09/18/16 2259 09/19/16 0346  TROPONINI <0.03 0.04*   BNP (last 3 results) No results for input(s): BNP in the last 8760 hours.  ProBNP (last 3 results) No results for input(s): PROBNP in the last 8760 hours.  CBG:  Recent Labs Lab 09/18/16 2305 09/19/16 0603  GLUCAP 381* 240*    No results found for this or any previous visit (from the past 240 hour(s)).   Studies: Dg Chest 2 View  Result Date: 09/19/2016 CLINICAL DATA:  Chest pain and cough. EXAM: CHEST  2 VIEW COMPARISON:  Two-view chest x-ray 09/18/2016 FINDINGS: The heart size is normal. Lung volumes are low. Moderate pulmonary vascular congestion is present. There is no edema or effusion. The visualized soft tissues and bony thorax are unremarkable. IMPRESSION: 1. Low lung volumes and moderate pulmonary vascular congestion without frank edema. Electronically Signed   By:  [place] Morelle M.D.   On: 09/19/2016 08:20   Dg Chest 2 View  Result Date: 09/18/2016 CLINICAL DATA:  Acute onset of nausea, lightheadedness, and shortness of breath. Acute onset of generalized chest pain. Initial encounter. EXAM: CHEST  2 VIEW COMPARISON:  Chest radiograph performed 03/18/2016 FINDINGS: The lungs are well-aerated. Mild vascular congestion is noted. There is no evidence of focal opacification, pleural effusion or pneumothorax. The heart is normal in size; the mediastinal contour is within normal limits. No acute  osseous abnormalities are seen. Clips are noted within the right upper quadrant, reflecting prior cholecystectomy. IMPRESSION: Mild vascular congestion noted.  Lungs remain grossly clear. Electronically Signed   By: Garald Balding M.D.   On: 09/18/2016 20:02    Scheduled Meds: . aspirin EC  325 mg Oral Daily  . atorvastatin  80 mg Oral q1800  . clopidogrel  75 mg Oral Daily  . dextromethorphan-guaiFENesin  1 tablet Oral BID  . enoxaparin (LOVENOX) injection  40 mg Subcutaneous QHS  . ezetimibe  10 mg Oral Daily  . insulin aspart  0-9 Units Subcutaneous TID WC  . insulin glargine  40 Units Subcutaneous QHS  . metroNIDAZOLE  500 mg Oral BID  . nicotine  21 mg Transdermal Daily  . omega-3 acid ethyl esters  1 g Oral Daily   Continuous Infusions: IVF  Principal Problem:   Chest pain Active Problems:   Diabetes mellitus type 2, controlled, with complications (HCC)   Dyslipidemia, goal LDL below 70   Coronary artery disease   Tobacco use disorder   Chest pain at rest   Asthma    Time spent: [facility] If 7PM-7AM, please contact night-coverage at [email or web address], password [email or web address] 09/19/2016, 9:24 AM  LOS: 0 days

## 2016-09-19 NOTE — Progress Notes (Signed)
  Lexiscan Myoview Pharmacologic stress portion completed. Images are currently pending. Signed,  Richardson Dopp, PA-C   09/19/2016 1:30 PM

## 2016-09-19 NOTE — Progress Notes (Signed)
Troponin elevated. MD notified. No new orders at this time. Patient denies chest pain.

## 2016-09-19 NOTE — Progress Notes (Signed)
Pt/family given discharge instructions, medication lists, follow up appointments, and when to call the doctor.  Pt/family verbalizes understanding. Sent text page to MD.  Patient would like letter for her work and her husbands job. Will have night shift RN to follow up.  Payton Emerald, RN

## 2016-09-19 NOTE — Progress Notes (Signed)
Echocardiogram 2D Echocardiogram has been performed.  Maureen Price 09/19/2016, 10:14 AM

## 2016-09-19 NOTE — Discharge Instructions (Signed)

## 2016-09-19 NOTE — Consult Note (Signed)
CARDIOLOGY CONSULT NOTE   Patient ID: Maureen Price MRN: [id number], DOB/AGE: 1976-08-09   Admit date: 09/18/2016 Date of Consult: 09/19/2016  Primary Physician: [name], MD Primary Cardiologist: Dr. Quay Burow  Reason for consult:  Elevated troponin, chest pain   Problem List  Past Medical History:  Diagnosis Date  . Acute myocardial infarction of other anterior wall, initial episode of care   . Anginal pain ([place])   . Anxiety   . Arthritis   . Asthma   . CAD (coronary artery disease)   . CHF (congestive heart failure) ([place])   . Depression   . Diabetes mellitus    2002; no meds since 2004  . Dyslipidemia, goal LDL below 70 10/26/2013  . Dysrhythmia   . GERD (gastroesophageal reflux disease)   . Headache(784.0)   . Obesity   . Paroxysmal ventricular tachycardia ([place])   . Shortness of breath   . STEMI (ST elevation myocardial infarction) ([place])   . Tobacco abuse     Past Surgical History:  Procedure Laterality Date  . CESAREAN SECTION    . CESAREAN SECTION  1994,1997,2000  . CHOLECYSTECTOMY    . CORONARY ANGIOPLASTY WITH STENT PLACEMENT    . GALLBLADDER SURGERY    . LEFT HEART CATH N/A 10/24/2013   Procedure: LEFT HEART CATH;  Surgeon: Lorretta Harp, MD;  Location: [facility] CATH LAB;  Service: Cardiovascular;  Laterality: N/A;  . LEFT HEART CATHETERIZATION WITH CORONARY ANGIOGRAM N/A 10/26/2013   Procedure: LEFT HEART CATHETERIZATION WITH CORONARY ANGIOGRAM;  Surgeon: Burnell Blanks, MD;  Location: [facility] CATH LAB;  Service: Cardiovascular;  Laterality: N/A;  . LEFT HEART CATHETERIZATION WITH CORONARY ANGIOGRAM N/A 03/29/2014   Procedure: LEFT HEART CATHETERIZATION WITH CORONARY ANGIOGRAM;  Surgeon: Lorretta Harp, MD;  Location: [facility] CATH LAB;  Service: Cardiovascular;  Laterality: N/A;  . TUBAL LIGATION     2000     Allergies  Allergies  Allergen Reactions  . Erythromycin Anaphylaxis  . Penicillins Anaphylaxis  . Metformin And Related Other  (See Comments)    diarrhea  . Viibryd [Vilazodone Hcl] Other (See Comments)    Altered mental status.      HPI   [name] is a 41- -year-old overweight married [place] American female mother of 3 children he works as a [employer] manager. Her primary care physician is Dr. [name], cardiologist Dr. Quay Burow. Patient has a history of coronary artery disease with inferior STEMI in December 2015 treated with direct angioplasty using a drug-eluting stent to the RCA. She had 2 admissions for chest pain rule out myocardial infarction in April and June of this year. A Myoview stress test performed 03/06/14 showed severe apical ischemia which led to a cardiac catheterization on 03/29/14 was entirely normal.   She has developed chest pain yesterday associated with coughing left-sided, pressure-like. She has been under a lot of stress as her father was just diagnosed with lung cancer. Her pain is nonexertional and only when she coughs. Initial troponin was 0.04. She is currently chest pain-free. She is compliant with her meds at home.  Inpatient Medications  . aspirin EC  325 mg Oral Daily  . atorvastatin  80 mg Oral q1800  . clopidogrel  75 mg Oral Daily  . dextromethorphan-guaiFENesin  1 tablet Oral BID  . enoxaparin (LOVENOX) injection  40 mg Subcutaneous QHS  . ezetimibe  10 mg Oral Daily  . furosemide  20 mg Intravenous Once  . insulin aspart  0-9 Units Subcutaneous  TID WC  . insulin glargine  40 Units Subcutaneous QHS  . metroNIDAZOLE  500 mg Oral BID  . nicotine  21 mg Transdermal Daily  . omega-3 acid ethyl esters  1 g Oral Daily    Family History Family History  Problem Relation Age of Onset  . Arthritis Mother   . Diabetes Mother   . Heart disease Father   . Stroke Father   . Diabetes Maternal Grandfather   . Cancer Neg Hx   . Alcohol abuse Neg Hx   . Early death Neg Hx   . Kidney disease Neg Hx   . Hypertension Neg Hx   . Hyperlipidemia Neg Hx      Social History Social  History   Social History  . Marital status: Married    Spouse name: N/A  . Number of children: N/A  . Years of education: N/A   Occupational History  . Not on file.   Social History Main Topics  . Smoking status: Current Every Day Smoker    Packs/day: 0.50    Years: 20.00    Types: Cigarettes  . Smokeless tobacco: Never Used     Comment: 3 cigs/day  . Alcohol use No     Comment: occasional  . Drug use: No  . Sexual activity: Yes    Birth control/ protection: Surgical   Other Topics Concern  . Not on file   Social History Narrative   Caffienated drinks- yes   Seat belt use often- yes   Regular Exercise- yes   Smoke alarm in the home- yes   Firearms/guns in the home- no   History of physical abuse- no              Review of Systems  General:  No chills, fever, night sweats or weight changes.  Cardiovascular:  No chest pain, dyspnea on exertion, edema, orthopnea, palpitations, paroxysmal nocturnal dyspnea. Dermatological: No rash, lesions/masses Respiratory: No cough, dyspnea Urologic: No hematuria, dysuria Abdominal:   No nausea, vomiting, diarrhea, bright red blood per rectum, melena, or hematemesis Neurologic:  No visual changes, wkns, changes in mental status. All other systems reviewed and are otherwise negative except as noted above.  Physical Exam  Blood pressure 109/65, pulse 78, temperature 98.8 F (37.1 C), temperature source Oral, resp. rate 20, height 5\' 4"  (1.626 m), weight 226 lb 14.4 oz (102.9 kg), last menstrual period 08/31/2016, SpO2 99 %.  General: Pleasant, NAD Psych: Normal affect. Neuro: Alert and oriented X 3. Moves all extremities spontaneously. HEENT: Normal  Neck: Supple without bruits or JVD. Lungs:  Resp regular and unlabored, CTA. Heart: RRR no s3, s4, or murmurs. Abdomen: Soft, non-tender, non-distended, BS + x 4.  Extremities: No clubbing, cyanosis or edema. DP/PT/Radials 2+ and equal bilaterally.  Labs   Recent Labs   09/18/16 2259 09/19/16 0346 09/19/16 0953  TROPONINI <0.03 0.04* <0.03   Lab Results  Component Value Date   WBC 9.2 09/18/2016   HGB 14.0 09/18/2016   HCT 39.5 09/18/2016   MCV 78.8 09/18/2016   PLT 266 09/18/2016    Recent Labs Lab 09/18/16 1912  NA 137  K 3.6  CL 103  CO2 24  BUN <5*  CREATININE 0.53  CALCIUM 9.1  GLUCOSE 321*   Lab Results  Component Value Date   CHOL 150 09/19/2016   HDL 22 (L) 09/19/2016   LDLCALC 91 09/19/2016   TRIG 185 (H) 09/19/2016   Lab Results  Component Value Date  DDIMER 0.69 (H) 01/16/2015   Invalid input(s): POCBNP  Radiology/Studies  Dg Chest 2 View  Result Date: 09/19/2016 CLINICAL DATA:  Chest pain and cough. EXAM: CHEST  2 VIEW COMPARISON:  Two-view chest x-ray 09/18/2016 FINDINGS: The heart size is normal. Lung volumes are low. Moderate pulmonary vascular congestion is present. There is no edema or effusion. The visualized soft tissues and bony thorax are unremarkable. IMPRESSION: 1. Low lung volumes and moderate pulmonary vascular congestion without frank edema. Electronically Signed   By: San Morelle M.D.   On: 09/19/2016 08:20   Dg Chest 2 View  Result Date: 09/18/2016 CLINICAL DATA:  Acute onset of nausea, lightheadedness, and shortness of breath. Acute onset of generalized chest pain. Initial encounter. EXAM: CHEST  2 VIEW COMPARISON:  Chest radiograph performed 03/18/2016 FINDINGS: The lungs are well-aerated. Mild vascular congestion is noted. There is no evidence of focal opacification, pleural effusion or pneumothorax. The heart is normal in size; the mediastinal contour is within normal limits. No acute osseous abnormalities are seen. Clips are noted within the right upper quadrant, reflecting prior cholecystectomy. IMPRESSION: Mild vascular congestion noted.  Lungs remain grossly clear. Electronically Signed   By: Garald Balding M.D.   On: 09/18/2016 20:02   Echocardiogram - 09/19/2016 - Left ventricle: The  cavity size was normal. There was mild focal   basal hypertrophy of the septum. Systolic function was normal.   The estimated ejection fraction was in the range of 60% to 65%.   Wall motion was normal; there were no regional wall motion   abnormalities. Left ventricular diastolic function parameters   were normal. - Aortic valve: Structurally normal valve. Trileaflet; normal   thickness leaflets. There was no regurgitation. - Aortic root: The aortic root was normal in size. - Mitral valve: Structurally normal valve. There was no   regurgitation. - Left atrium: The atrium was normal in size. - Right ventricle: Systolic function was normal. - Right atrium: The atrium was normal in size. - Tricuspid valve: There was trivial regurgitation. - Pulmonary arteries: Systolic pressure was within the normal   range. - Inferior vena cava: The vessel was normal in size. The   respirophasic diameter changes were in the normal range (>= 50%),   consistent with normal central venous pressure. - Pericardium, extracardiac: There was no pericardial effusion.  Impressions:  - Normal study.  ECG: SR, ? Anterior MI, age andetermined    ASSESSMENT AND PLAN  1. Chest pain, mildly elevated troponin in the settings of prior known coronary artery disease and stenting. The pain sounds atypical, troponin elevation is borderline, and EKG is nonacute. The pain sounds pleuritic however with her history we will order an exercise nuclear stress test, if negative will discharge the patient.  2. Essential hypertension - well controlled on current regimen  3. Hyperlipidemia - on atorvastatin and Zetia.  4. Tobacco use disorder She continues to smoke and recalcitrant risk factor modification   Signed, Ena Dawley, MD, [facility] 09/19/2016, 10:52 AM

## 2016-09-20 LAB — HEMOGLOBIN A1C
HEMOGLOBIN A1C: 13.1 % — AB (ref 4.8–5.6)
MEAN PLASMA GLUCOSE: 329 mg/dL

## 2016-09-21 ENCOUNTER — Encounter (HOSPITAL_COMMUNITY): Payer: Self-pay | Admitting: Family

## 2016-09-21 NOTE — Discharge Summary (Signed)
Physician Discharge Summary  [name] DOB: 1975-10-07 DOA: 09/18/2016  PCP: [name], MD  Admit date: 09/18/2016 Discharge date: 09/21/2016  Time spent: 45 minutes  Recommendations for Outpatient Follow-up:  1. Schedule appointment with cardiology for f/u in the next 2 weeks    Discharge Diagnoses:  Principal Problem:   Chest pain Active Problems:   Diabetes mellitus type 2, controlled, with complications (HCC)   Dyslipidemia, goal LDL below 70   Coronary artery disease   Tobacco use disorder   Chest pain at rest   Asthma   Discharge Condition: Stable  Diet recommendation: Cardiac/Diabetic  Filed Weights   09/18/16 1825 09/18/16 2300 09/18/16 2306  Weight: 101.6 kg (224 lb) 102.7 kg (226 lb 6.4 oz) 102.9 kg (226 lb 14.4 oz)    History of present illness:  Maureen Price is a 41 y.o. female with medical history significant of diabetes mellitus, hyperlipidemia, asthma, GERD, depression, anxiety, tobacco abuse, CAD, STEMI, s/p of stent placement in 2015, who presents with chest pain and cough.  Patient states that her chest pain started at 5:30. It is located in the substernal area, 9 out of 10 in severity, pressure-like, nonradiating. Chest is not pleuritic. No calf tenderness. It is associated with nausea, no vomiting, diarrhea or abdominal pain. Patient was given aspirin and nitroglycerin, her chest pain subsided now. She also has dry cough started today. She had mild shortness of breath earlier, which has resolved. No runny nose or sore throat. No wheezing. Patient denies symptoms of UTI or unilateral weakness.  Hospital Course:  Admitted as CP observation patient for rule out. Hx concerning for ACS & pt followed by [facility] cardiology so cardiology consulted. ECHO normal. NM stress test ordered by cardiology which was negative. Pt d/c home. Pt's CP likely msk pain from coughing from recent mild bronchitis.  Procedures: ECHO 09/19/16 Study  Conclusions  - Left ventricle: The cavity size was normal. There was mild focal   basal hypertrophy of the septum. Systolic function was normal.   The estimated ejection fraction was in the range of 60% to 65%.   Wall motion was normal; there were no regional wall motion   abnormalities. Left ventricular diastolic function parameters   were normal.  Consultations:  Cardiology  Discharge Exam: Vitals:   09/19/16 1333 09/19/16 1451  BP: (!) 103/55 (!) 121/94  Pulse:  100  Resp:  20  Temp:      General: NAD, A&Ox3 Cardiovascular: RRR, no MRG Respiratory: CTAB, nl wob  Discharge Instructions   Discharge Instructions    Diet - low sodium heart healthy    Complete by:  As directed    Discharge instructions    Complete by:  As directed    Follow-up with cardiology Stay on your heart regimen as advised by your cardiologist [facility] as a normal practice does not refill narcotic medications please follow-up with your PCP   Increase activity slowly    Complete by:  As directed      Discharge Medication List as of 09/19/2016  6:16 PM    START taking these medications   Details  !! nitroGLYCERIN (NITROSTAT) 0.4 MG SL tablet Place 1 tablet (0.4 mg total) under the tongue every 5 (five) minutes as needed for chest pain (Hold for SBP <100)., Starting Sat 09/19/2016, Normal     !! - Potential duplicate medications found. Please discuss with provider.    CONTINUE these medications which have NOT CHANGED   Details  aspirin EC 81 MG tablet Take 81 mg by mouth daily. Reported on 09/20/2015, Until Discontinued, Historical Med    atorvastatin (LIPITOR) 80 MG tablet Take 1 tablet (80 mg total) by mouth daily., Starting Tue 10/08/2015, Normal    Biotin 1000 MCG tablet Take 1,000 mcg by mouth 2 (two) times daily. Reported on 09/20/2015, Until Discontinued, Historical Med    Blood Glucose Monitoring Suppl (Friendly) w/Device KIT 1 Act by Does not apply route 3  (three) times daily., Starting Wed 05/20/2016, Sample    clopidogrel (PLAVIX) 75 MG tablet Take 1 tablet (75 mg total) by mouth daily., Starting Wed 11/06/2015, Normal    glucose blood test strip Use TID OneTouch Verio, Normal    Insulin Glargine (TOUJEO SOLOSTAR) 300 UNIT/ML SOPN Inject 50 Units into the skin daily., Starting Wed 05/20/2016, Normal    metroNIDAZOLE (FLAGYL) 500 MG tablet Take 1 tablet (500 mg total) by mouth 2 (two) times daily., Starting Wed 08/05/2016, Normal    Omega-3 Fatty Acids (FISH OIL PO) Take 1 tablet by mouth daily., Until Discontinued, Historical Med    potassium chloride SA (K-DUR,KLOR-CON) 20 MEQ tablet Take 1 tablet (20 mEq total) by mouth daily., Starting 01/03/2015, Until Discontinued, Normal    Insulin Pen Needle 32G X 5 MM MISC Use pen needle with victoza insulin injector as directed by your physician., Normal    Liraglutide (VICTOZA) 18 MG/3ML SOPN Inject 0.3 mLs (1.8 mg total) into the skin daily., Starting Wed 05/20/2016, Normal    !! nitroGLYCERIN (NITROSTAT) 0.4 MG SL tablet Place 0.4 mg under the tongue every 5 (five) minutes as needed for chest pain. Reported on 09/20/2015, Until Discontinued, Historical Med     !! - Potential duplicate medications found. Please discuss with provider.    STOP taking these medications     ezetimibe (ZETIA) 10 MG tablet      ticagrelor (BRILINTA) 90 MG TABS tablet        Allergies  Allergen Reactions  . Erythromycin Anaphylaxis  . Penicillins Anaphylaxis  . Metformin And Related Other (See Comments)    diarrhea  . Viibryd [Vilazodone Hcl] Other (See Comments)    Altered mental status.        The results of significant diagnostics from this hospitalization (including imaging, microbiology, ancillary and laboratory) are listed below for reference.    Significant Diagnostic Studies: Dg Chest 2 View  Result Date: 09/19/2016 CLINICAL DATA:  Chest pain and cough. EXAM: CHEST  2 VIEW COMPARISON:  Two-view  chest x-ray 09/18/2016 FINDINGS: The heart size is normal. Lung volumes are low. Moderate pulmonary vascular congestion is present. There is no edema or effusion. The visualized soft tissues and bony thorax are unremarkable. IMPRESSION: 1. Low lung volumes and moderate pulmonary vascular congestion without frank edema. Electronically Signed   By: San Morelle M.D.   On: 09/19/2016 08:20   Dg Chest 2 View  Result Date: 09/18/2016 CLINICAL DATA:  Acute onset of nausea, lightheadedness, and shortness of breath. Acute onset of generalized chest pain. Initial encounter. EXAM: CHEST  2 VIEW COMPARISON:  Chest radiograph performed 03/18/2016 FINDINGS: The lungs are well-aerated. Mild vascular congestion is noted. There is no evidence of focal opacification, pleural effusion or pneumothorax. The heart is normal in size; the mediastinal contour is within normal limits. No acute osseous abnormalities are seen. Clips are noted within the right upper quadrant, reflecting prior cholecystectomy. IMPRESSION: Mild vascular congestion noted.  Lungs remain grossly clear. Electronically Signed   By:  Garald Balding M.D.   On: 09/18/2016 20:02   Nm Myocar Multi W/spect W/wall Motion / Ef  Result Date: 09/19/2016 CLINICAL DATA:  prior myocardial infarction. Atrial fibrillation. Prior catheterization and stent placement. Smoker. EXAM: MYOCARDIAL IMAGING WITH SPECT (REST AND PHARMACOLOGIC-STRESS) GATED LEFT VENTRICULAR WALL MOTION STUDY LEFT VENTRICULAR EJECTION FRACTION TECHNIQUE: Standard myocardial SPECT imaging was performed after resting intravenous injection of 10 mCi Tc-37mtetrofosmin. Subsequently, intravenous infusion of Lexiscan was performed under the supervision of the Cardiology staff. At peak effect of the drug, 30 mCi Tc-987metrofosmin was injected intravenously and standard myocardial SPECT imaging was performed. Quantitative gated imaging was also performed to evaluate left ventricular wall motion, and  estimate left ventricular ejection fraction. COMPARISON:  Chest radiograph of 09/19/2016. FINDINGS: Perfusion: No decreased activity in the left ventricle on stress imaging to suggest reversible ischemia or infarction. Wall Motion: No focal wall motion abnormality. Moderately dilated left ventricle. Left Ventricular Ejection Fraction: 57 % End diastolic volume 13841l End systolic volume 58 ml IMPRESSION: 1. No reversible ischemia or infarction. 2. Normal left ventricular wall motion. 3. Left ventricular ejection fraction 57% 4. Non invasive risk stratification*: Low *2012 Appropriate Use Criteria for Coronary Revascularization Focused Update: J Am Coll Cardiol. 203244;01(0):[email or web address] Electronically Signed   By: KyAbigail Miyamoto.D.   On: 09/19/2016 15:35    Microbiology: No results found for this or any previous visit (from the past 240 hour(s)).   Labs: Basic Metabolic Panel:  Recent Labs Lab 09/18/16 1912  NA 137  K 3.6  CL 103  CO2 24  GLUCOSE 321*  BUN <5*  CREATININE 0.53  CALCIUM 9.1   Liver Function Tests: No results for input(s): AST, ALT, ALKPHOS, BILITOT, PROT, ALBUMIN in the last 168 hours. No results for input(s): LIPASE, AMYLASE in the last 168 hours. No results for input(s): AMMONIA in the last 168 hours. CBC:  Recent Labs Lab 09/18/16 1912  WBC 9.2  HGB 14.0  HCT 39.5  MCV 78.8  PLT 266   Cardiac Enzymes:  Recent Labs Lab 09/18/16 2259 09/19/16 0346 09/19/16 0953  TROPONINI <0.03 0.04* <0.03   BNP: BNP (last 3 results)  Recent Labs  09/19/16 0953  BNP 12.1    ProBNP (last 3 results) No results for input(s): PROBNP in the last 8760 hours.  CBG:  Recent Labs Lab 09/18/16 2305 09/19/16 0603 09/19/16 1105 09/19/16 1447 09/19/16 1612  GLUCAP 381* 240* 166* 291* 333*       Signed:  PhElwin MochaD  FACP  [facility] 09/21/2016, 8:20 AM

## 2016-10-29 ENCOUNTER — Telehealth: Payer: Self-pay | Admitting: Internal Medicine

## 2016-10-29 NOTE — Telephone Encounter (Signed)
Pt called in and wanted to know if she could get anymore samples of toujeo

## 2016-10-29 NOTE — Telephone Encounter (Signed)
I only have one pen right now.

## 2016-11-13 ENCOUNTER — Other Ambulatory Visit: Payer: Self-pay | Admitting: Cardiovascular Disease

## 2016-11-13 DIAGNOSIS — I2584 Coronary atherosclerosis due to calcified coronary lesion: Secondary | ICD-10-CM

## 2016-11-13 DIAGNOSIS — E785 Hyperlipidemia, unspecified: Secondary | ICD-10-CM

## 2016-11-13 DIAGNOSIS — I2510 Atherosclerotic heart disease of native coronary artery without angina pectoris: Secondary | ICD-10-CM

## 2016-12-18 ENCOUNTER — Other Ambulatory Visit: Payer: Self-pay | Admitting: Internal Medicine

## 2017-05-02 ENCOUNTER — Emergency Department (HOSPITAL_COMMUNITY): Payer: Worker's Compensation

## 2017-05-02 ENCOUNTER — Emergency Department (HOSPITAL_COMMUNITY)
Admission: EM | Admit: 2017-05-02 | Discharge: 2017-05-02 | Disposition: A | Discharge status: Home / Self Care | Payer: Worker's Compensation | Attending: Emergency Medicine | Admitting: Emergency Medicine

## 2017-05-02 ENCOUNTER — Encounter (HOSPITAL_COMMUNITY): Payer: Self-pay | Admitting: Emergency Medicine

## 2017-05-02 DIAGNOSIS — M25512 Pain in left shoulder: Secondary | ICD-10-CM | POA: Diagnosis present

## 2017-05-02 DIAGNOSIS — I252 Old myocardial infarction: Secondary | ICD-10-CM | POA: Diagnosis not present

## 2017-05-02 DIAGNOSIS — Z7902 Long term (current) use of antithrombotics/antiplatelets: Secondary | ICD-10-CM | POA: Diagnosis not present

## 2017-05-02 DIAGNOSIS — E119 Type 2 diabetes mellitus without complications: Secondary | ICD-10-CM | POA: Diagnosis not present

## 2017-05-02 DIAGNOSIS — I110 Hypertensive heart disease with heart failure: Secondary | ICD-10-CM | POA: Diagnosis not present

## 2017-05-02 DIAGNOSIS — I2510 Atherosclerotic heart disease of native coronary artery without angina pectoris: Secondary | ICD-10-CM | POA: Diagnosis not present

## 2017-05-02 DIAGNOSIS — F17210 Nicotine dependence, cigarettes, uncomplicated: Secondary | ICD-10-CM | POA: Diagnosis not present

## 2017-05-02 DIAGNOSIS — I509 Heart failure, unspecified: Secondary | ICD-10-CM | POA: Diagnosis not present

## 2017-05-02 DIAGNOSIS — Z95818 Presence of other cardiac implants and grafts: Secondary | ICD-10-CM | POA: Insufficient documentation

## 2017-05-02 DIAGNOSIS — J45909 Unspecified asthma, uncomplicated: Secondary | ICD-10-CM | POA: Insufficient documentation

## 2017-05-02 DIAGNOSIS — Z7982 Long term (current) use of aspirin: Secondary | ICD-10-CM | POA: Insufficient documentation

## 2017-05-02 DIAGNOSIS — Z955 Presence of coronary angioplasty implant and graft: Secondary | ICD-10-CM | POA: Insufficient documentation

## 2017-05-02 DIAGNOSIS — Z7984 Long term (current) use of oral hypoglycemic drugs: Secondary | ICD-10-CM | POA: Insufficient documentation

## 2017-05-02 MED ORDER — OXYCODONE-ACETAMINOPHEN 5-325 MG PO TABS
2.0000 | ORAL_TABLET | Freq: Once | ORAL | Status: AC
  Administered 2017-05-02: 2 via ORAL
  Filled 2017-05-02: qty 2

## 2017-05-02 MED ORDER — TRAMADOL HCL 50 MG PO TABS: 50.0000 mg | ORAL_TABLET | Freq: Four times a day (QID) | ORAL | 0 refills | Status: DC | PRN

## 2017-05-02 MED ORDER — DIAZEPAM 5 MG PO TABS: 5.0000 mg | ORAL_TABLET | Freq: Two times a day (BID) | ORAL | 0 refills | Status: DC

## 2017-05-02 NOTE — Discharge Instructions (Signed)
Take the prescribed medication as directed.  Wear sling for now.  Can try heat and ice at home as well. Follow-up with your primary care doctor. If shoulder not improving, will need to follow-up with orthopedics as per your worker's comp policy. Return to the ED for new or worsening symptoms.

## 2017-05-02 NOTE — ED Provider Notes (Signed)
[facility] DEPT Provider Note   CSN: [id number] Arrival date & time: 05/02/17  0544     History   Chief Complaint Chief Complaint  Patient presents with  . Shoulder Pain    HPI Maureen Price is a 41 y.o. female.  The history is provided by the patient and medical records.  Shoulder Pain      41 year old female with history of MI, arthritis, anxiety, coronary artery disease, congestive heart failure, dyslipidemia, obesity, headaches, presenting to the ED with left shoulder pain. Patient works at [employer] and was helping to move and 800+ pound female patient. States there were 6 females trying to turn her.  States while moving the patient she felt a "pop" in her left shoulder.  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 States she sat her arm on the table during her lunch break and when she picked it up and laid it down by her side and felt a "clunk".  States shoulder has started swelling.  She is having difficulty moving the left shoulder/arm.  No numbness/weakness.  Patient is right hand dominant.  Past Medical History:  Diagnosis Date  . Acute myocardial infarction of other anterior wall, initial episode of care   . Anginal pain ([place])   . Anxiety   . Arthritis   . Asthma   . CAD (coronary artery disease)   . CHF (congestive heart failure) ([place])   . Depression   . Diabetes mellitus    2002; no meds since 2004  . Dyslipidemia, goal LDL below 70 10/26/2013  . Dysrhythmia   . GERD (gastroesophageal reflux disease)   . Headache(784.0)   . Obesity   . Paroxysmal ventricular tachycardia ([place])   . Shortness of breath   . STEMI (ST elevation myocardial infarction) ([place])   . Tobacco abuse     Patient Active Problem List   Diagnosis Date Noted  . Chest pain at rest 09/18/2016  . Asthma 09/18/2016  . Screening for cervical cancer 05/20/2016  . DDD (degenerative disc  disease), lumbar 01/02/2015  . GAD (generalized anxiety disorder) 06/14/2014  . Atherosclerosis of native artery of extremity with intermittent claudication ([place]) 03/05/2014  . Tobacco use disorder 03/05/2014  . Coronary artery disease 11/06/2013  . Dyslipidemia, goal LDL below 70 10/26/2013  . Obesity   . STEMI (ST elevation myocardial infarction), PTCA/Stent DES- [employer] [place]- LAD, large burden thrombus in LAD and RCA  10/24/2013  . Chest pain 06/27/2013  . Diabetes mellitus type 2, controlled, with complications ([place]) 55/73/2202  . DJD (degenerative joint disease) of knee 07/07/2012    Past Surgical History:  Procedure Laterality Date  . CESAREAN SECTION    . CESAREAN SECTION  1994,1997,2000  . CHOLECYSTECTOMY    . CORONARY ANGIOPLASTY WITH STENT PLACEMENT    . GALLBLADDER SURGERY    . LEFT HEART CATH N/A 10/24/2013   Procedure: LEFT HEART CATH;  Surgeon: Lorretta Harp, MD;  Location: [facility] CATH LAB;  Service: Cardiovascular;  Laterality: N/A;  . LEFT HEART CATHETERIZATION WITH CORONARY ANGIOGRAM N/A 10/26/2013   Procedure: LEFT HEART CATHETERIZATION WITH CORONARY ANGIOGRAM;  Surgeon: Burnell Blanks, MD;  Location: [facility] CATH LAB;  Service: Cardiovascular;  Laterality: N/A;  . LEFT HEART CATHETERIZATION WITH CORONARY ANGIOGRAM N/A 03/29/2014   Procedure: LEFT HEART CATHETERIZATION WITH CORONARY ANGIOGRAM;  Surgeon: Lorretta Harp, MD;  Location[facility] CATH LAB;  Service: Cardiovascular;  Laterality: N/A;  . TUBAL LIGATION     2000    OB History    Gravida Para Term Preterm AB Living   4 3 3   1 3    SAB TAB Ectopic Multiple Live Births   1               Home Medications    Prior to Admission medications   Medication Sig Start Date End Date Taking? Authorizing Provider  aspirin EC 81 MG tablet Take 81 mg by mouth daily. Reported on 09/20/2015    [provider]  atorvastatin (LIPITOR) 80 MG tablet TAKE ONE TABLET BY MOUTH DAILY 11/13/16   Lorretta Harp, MD    Biotin 1000 MCG tablet Take 1,000 mcg by mouth 2 (two) times daily. Reported on 09/20/2015    [provider]  Blood Glucose Monitoring Suppl ([place]) w/Device KIT 1 Act by Does not apply route 3 (three) times daily. 05/20/16   [name], MD  clopidogrel (PLAVIX) 75 MG tablet TAKE ONE TABLET BY MOUTH DAILY 11/13/16   Lorretta Harp, MD  glucose blood test strip Use TID OneTouch Verio 05/20/16   [name], MD  Insulin Glargine (TOUJEO SOLOSTAR) 300 UNIT/ML SOPN Inject 50 Units into the skin daily. 05/20/16   [name], MD  Insulin Pen Needle 32G X 5 MM MISC Use pen needle with victoza insulin injector as directed by your physician. 01/02/15   [name], MD  Liraglutide (VICTOZA) 18 MG/3ML SOPN Inject 0.3 mLs (1.8 mg total) into the skin daily. Patient not taking: Reported on 09/18/2016 05/20/16   [name], MD  metroNIDAZOLE (FLAGYL) 500 MG tablet Take 1 tablet (500 mg total) by mouth 2 (two) times daily. 08/05/16   Salvadore Dom, MD  nitroGLYCERIN (NITROSTAT) 0.4 MG SL tablet Place 0.4 mg under the tongue every 5 (five) minutes as needed for chest pain. Reported on 09/20/2015    [provider]  nitroGLYCERIN (NITROSTAT) 0.4 MG SL tablet Place 1 tablet (0.4 mg total) under the tongue every 5 (five) minutes as needed for chest pain (Hold for SBP <100). 09/19/16   Elwin Mocha, MD  Omega-3 Fatty Acids (FISH OIL PO) Take 1 tablet by mouth daily.    [provider]  potassium chloride SA (K-DUR,KLOR-CON) 20 MEQ tablet Take 1 tablet (20 mEq total) by mouth daily. 01/03/15   [name], MD    Family History Family History  Problem Relation Age of Onset  . Arthritis Mother   . Diabetes Mother   . Heart disease Father   . Stroke Father   . Diabetes Maternal Grandfather   . Cancer Neg Hx   . Alcohol abuse Neg Hx   . Early death Neg Hx   . Kidney disease Neg Hx   . Hypertension Neg Hx   . Hyperlipidemia Neg Hx      Social History Social History  Substance Use Topics  . Smoking status: Current Every Day Smoker    Packs/day: 0.50    Years: 20.00    Types: Cigarettes  . Smokeless tobacco: Never Used     Comment: 3 cigs/day  . Alcohol use No     Comment: occasional     Allergies   Erythromycin; Penicillins; Metformin and related; and Viibryd [vilazodone hcl]   Review of Systems Review of Systems  Musculoskeletal: Positive for arthralgias.  All other systems reviewed and are negative.  Physical Exam Updated Vital Signs BP (!) 142/90   Pulse (!) 101   Temp 98.1 F (36.7 C) (Oral)   Resp 18   Ht 5' 4"  (1.626 m)   Wt 102.5 kg (226 lb)   SpO2 99%   BMI 38.79 kg/m   Physical Exam  Constitutional: She is oriented to person, place, and time. She appears well-developed and well-nourished.  HENT:  Head: Normocephalic and atraumatic.  Mouth/Throat: Oropharynx is clear and moist.  Eyes: Pupils are equal, round, and reactive to light. Conjunctivae and EOM are normal.  Neck: Normal range of motion.  Cardiovascular: Normal rate, regular rhythm and normal heart sounds.   Pulmonary/Chest: Effort normal and breath sounds normal.  Abdominal: Soft. Bowel sounds are normal.  Musculoskeletal: Normal range of motion.  Left shoulder diffusely tender, does seem worse along posterior aspect of AC joint; there is some mild swelling and spasm noted; limited ROM of shoulder; full ROM of elbow, wrist, fingers; normal radial pulse and cap refill; normal sensation distally  Neurological: She is alert and oriented to person, place, and time.  Skin: Skin is warm and dry.  Psychiatric: She has a normal mood and affect.  Nursing note and vitals reviewed.    ED Treatments / Results  Labs (all labs ordered are listed, but only abnormal results are displayed) Labs Reviewed - No data to display  EKG  EKG Interpretation None       Radiology Dg Shoulder 1 View Left  Result Date:  05/02/2017 CLINICAL DATA:  Shoulder injury and pain. EXAM: LEFT SHOULDER - 1 VIEW COMPARISON:  Prior today FINDINGS: Axillary view shows no evidence of shoulder dislocation. No fracture or other bone abnormality identified. IMPRESSION: Negative. Electronically Signed   By: Earle Gell M.D.   On: 05/02/2017 07:37   Dg Shoulder Left  Result Date: 05/02/2017 CLINICAL DATA:  Shoulder injury EXAM: LEFT SHOULDER - 2+ VIEW COMPARISON:  None. FINDINGS: There is irregularity at the left acromioclavicular joint, poorly visible due to limited patient positioning. Glenohumeral joint is approximated. No humeral fracture. IMPRESSION: Irregular appearance of the acromioclavicular joint on the lateral projection. If the patient can tolerate an axillary view after pain medication, this may be helpful. Electronically Signed   By: Ulyses Jarred M.D.   On: 05/02/2017 06:22    Procedures Procedures (including critical care time)  Medications Ordered in ED Medications  oxyCODONE-acetaminophen (PERCOCET/ROXICET) 5-325 MG per tablet 2 tablet (2 tablets Oral Given 05/02/17 5284)     Initial Impression / Assessment and Plan / ED Course  I have reviewed the triage vital signs and the nursing notes.  Pertinent labs & imaging results that were available during my care of the patient were reviewed by me and considered in my medical decision making (see chart for details).  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 States she felt a "pop" and later began having worsening pain and swelling. She does have some significant tenderness along the posterior portion of the shoulder and before meals joint, mild swelling and spasm noted along the musculature. Arm is neurovascularly intact. Difficult to with range of motion. Initial films with irregular appearance of before meals joint on lateral projection, initially was not able to tolerate axillary  view. After receiving pain medication here, patient was able to tolerate this and it is negative. Arm was placed in shoulder sling for comfort.  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  Rx valium, tramadol.  Discussed plan with patient, she acknowledged understanding and agreed with plan of care.  Return precautions given for new or worsening symptoms.  Final Clinical Impressions(s) / ED Diagnoses   Final diagnoses:  Acute pain of left shoulder    New Prescriptions Discharge Medication List as of 05/02/2017  8:18 AM    START taking these medications   Details  diazepam (VALIUM) 5 MG tablet Take 1 tablet (5 mg total) by mouth 2 (two) times daily., Starting Sun 05/02/2017, Print    traMADol (ULTRAM) 50 MG tablet Take 1 tablet (50 mg total) by mouth every 6 (six) hours as needed., Starting Sun 05/02/2017, Print         Larene Pickett, PA-C 05/02/17 [facility], Delice Bison, DO 05/02/17 [phone]

## 2017-05-02 NOTE — ED Triage Notes (Signed)
Reports being at work tonight at [employer] to help turn a 800 lb patient.  Felt a pop in left shoulder.  As night progressed pain got worse and shoulder began swelling.

## 2017-05-02 NOTE — ED Notes (Signed)
Pt in X-Ray ?

## 2017-06-01 ENCOUNTER — Ambulatory Visit (INDEPENDENT_AMBULATORY_CARE_PROVIDER_SITE_OTHER): Payer: BLUE CROSS/BLUE SHIELD | Admitting: Nurse Practitioner

## 2017-06-01 ENCOUNTER — Encounter: Payer: Self-pay | Admitting: Nurse Practitioner

## 2017-06-01 VITALS — BP 120/78 | HR 115 | Temp 98.2°F | Ht 64.0 in | Wt 217.0 lb

## 2017-06-01 DIAGNOSIS — E118 Type 2 diabetes mellitus with unspecified complications: Secondary | ICD-10-CM

## 2017-06-01 DIAGNOSIS — F411 Generalized anxiety disorder: Secondary | ICD-10-CM | POA: Diagnosis not present

## 2017-06-01 DIAGNOSIS — M170 Bilateral primary osteoarthritis of knee: Secondary | ICD-10-CM

## 2017-06-01 DIAGNOSIS — Z794 Long term (current) use of insulin: Secondary | ICD-10-CM

## 2017-06-01 DIAGNOSIS — M5136 Other intervertebral disc degeneration, lumbar region: Secondary | ICD-10-CM | POA: Diagnosis not present

## 2017-06-01 DIAGNOSIS — M51369 Other intervertebral disc degeneration, lumbar region without mention of lumbar back pain or lower extremity pain: Secondary | ICD-10-CM

## 2017-06-01 MED ORDER — INSULIN GLARGINE 300 UNIT/ML ~~LOC~~ SOPN: 50.0000 [IU] | PEN_INJECTOR | Freq: Every day | SUBCUTANEOUS | 1 refills | Status: DC

## 2017-06-01 MED ORDER — METHOCARBAMOL 500 MG PO TABS: 500.0000 mg | ORAL_TABLET | Freq: Three times a day (TID) | ORAL | 0 refills | Status: DC | PRN

## 2017-06-01 MED ORDER — BUSPIRONE HCL 7.5 MG PO TABS: 7.5000 mg | ORAL_TABLET | Freq: Three times a day (TID) | ORAL | 0 refills | Status: DC | PRN

## 2017-06-01 MED ORDER — MELOXICAM 7.5 MG PO TABS: 7.5000 mg | ORAL_TABLET | Freq: Every day | ORAL | 0 refills | Status: DC | PRN

## 2017-06-01 NOTE — Progress Notes (Signed)
Subjective:  Patient ID: Maureen Price, female    DOB: 07-Dec-1975  Age: 41 y.o. MRN: [id number]  CC: Pain (knee and back pain--going 2 mo--aleve is not helping/ flu shot?) and Medication Refill (xanax refills and toujeo? )   HPI DM: Needs toujeo refill. Did not make appt with endocrinology as recommended. She is not checking glucose at home. Denies any hypoglycemic symptoms  Anxiety: She will like to resume use of xanax. Rx d/c by pcp due to inconsistent UDS. No SI or HI. She is not interested in counseling. No other maintenance medication use in past.  Chronic back and knee pain: Secondary to arthritis per radiology report. She wants to resume hydrocodone Rx which was d/c per pcpc due to inconsistent UDS.   Outpatient Medications Prior to Visit  Medication Sig Dispense Refill  . aspirin EC 81 MG tablet Take 81 mg by mouth daily. Reported on 09/20/2015    . atorvastatin (LIPITOR) 80 MG tablet TAKE ONE TABLET BY MOUTH DAILY 30 tablet 11  . Biotin 1000 MCG tablet Take 1,000 mcg by mouth 2 (two) times daily. Reported on 09/20/2015    . Blood Glucose Monitoring Suppl (ONETOUCH VERIO FLEX SYSTEM) w/Device KIT 1 Act by Does not apply route 3 (three) times daily. 2 kit 0  . clopidogrel (PLAVIX) 75 MG tablet TAKE ONE TABLET BY MOUTH DAILY 30 tablet 11  . glucose blood test strip Use TID OneTouch Verio 100 each 12  . Insulin Pen Needle 32G X 5 MM MISC Use pen needle with victoza insulin injector as directed by your physician. 100 each 3  . nitroGLYCERIN (NITROSTAT) 0.4 MG SL tablet Place 1 tablet (0.4 mg total) under the tongue every 5 (five) minutes as needed for chest pain (Hold for SBP <100). 30 tablet 0  . Omega-3 Fatty Acids (FISH OIL PO) Take 1 tablet by mouth daily.    . potassium chloride SA (K-DUR,KLOR-CON) 20 MEQ tablet Take 1 tablet (20 mEq total) by mouth daily. 30 tablet 3  . Insulin Glargine (TOUJEO SOLOSTAR) 300 UNIT/ML SOPN Inject 50 Units into the skin daily. 1.5 mL  11  . diazepam (VALIUM) 5 MG tablet Take 1 tablet (5 mg total) by mouth 2 (two) times daily. (Patient not taking: Reported on 06/01/2017) 10 tablet 0  . Liraglutide (VICTOZA) 18 MG/3ML SOPN Inject 0.3 mLs (1.8 mg total) into the skin daily. (Patient not taking: Reported on 09/18/2016) 3 mL 11  . nitroGLYCERIN (NITROSTAT) 0.4 MG SL tablet Place 0.4 mg under the tongue every 5 (five) minutes as needed for chest pain. Reported on 09/20/2015    . traMADol (ULTRAM) 50 MG tablet Take 1 tablet (50 mg total) by mouth every 6 (six) hours as needed. (Patient not taking: Reported on 06/01/2017) 15 tablet 0  . metroNIDAZOLE (FLAGYL) 500 MG tablet Take 1 tablet (500 mg total) by mouth 2 (two) times daily. (Patient not taking: Reported on 06/01/2017) 14 tablet 0   No facility-administered medications prior to visit.     ROS See HPI  Objective:  BP 120/78   Pulse (!) 115   Temp 98.2 F (36.8 C)   Ht _0  (1.626 m)   Wt 217 lb (98.4 kg)   SpO2 98%   BMI 37.25 kg/m   BP Readings from Last 3 Encounters:  06/01/17 120/78  05/02/17 123/74  09/19/16 (!) 121/94    Wt Readings from Last 3 Encounters:  06/01/17 217 lb (98.4 kg)  05/02/17 226 lb (102.5 kg)  09/18/16 226 lb 14.4 oz (102.9 kg)    Physical Exam  Constitutional: She is oriented to person, place, and time. No distress.  Cardiovascular: Normal rate.   Pulmonary/Chest: Effort normal.  Neurological: She is alert and oriented to person, place, and time.  Vitals reviewed.   Lab Results  Component Value Date   WBC 9.2 09/18/2016   HGB 14.0 09/18/2016   HCT 39.5 09/18/2016   PLT 266 09/18/2016   GLUCOSE 321 (H) 09/18/2016   CHOL 150 09/19/2016   TRIG 185 (H) 09/19/2016   HDL 22 (L) 09/19/2016   LDLCALC 91 09/19/2016   ALT 15 05/20/2016   AST 9 05/20/2016   NA 137 09/18/2016   K 3.6 09/18/2016   CL 103 09/18/2016   CREATININE 0.53 09/18/2016   BUN <5 (L) 09/18/2016   CO2 24 09/18/2016   TSH 2.40 08/26/2015   INR 1.04 01/16/2015    HGBA1C 13.1 (H) 09/19/2016   MICROALBUR <0.7 05/20/2016    Dg Shoulder 1 View Left  Result Date: 05/02/2017 CLINICAL DATA:  Shoulder injury and pain. EXAM: LEFT SHOULDER - 1 VIEW COMPARISON:  Prior today FINDINGS: Axillary view shows no evidence of shoulder dislocation. No fracture or other bone abnormality identified. IMPRESSION: Negative. Electronically Signed   By: Earle Gell M.D.   On: 05/02/2017 07:37   Dg Shoulder Left  Result Date: 05/02/2017 CLINICAL DATA:  Shoulder injury EXAM: LEFT SHOULDER - 2+ VIEW COMPARISON:  None. FINDINGS: There is irregularity at the left acromioclavicular joint, poorly visible due to limited patient positioning. Glenohumeral joint is approximated. No humeral fracture. IMPRESSION: Irregular appearance of the acromioclavicular joint on the lateral projection. If the patient can tolerate an axillary view after pain medication, this may be helpful. Electronically Signed   By: Ulyses Jarred M.D.   On: 05/02/2017 06:22    Assessment & Plan:   Maureen Price was seen today for pain and medication refill.  Diagnoses and all orders for this visit:  DDD (degenerative disc disease), lumbar -     meloxicam (MOBIC) 7.5 MG tablet; Take 1 tablet (7.5 mg total) by mouth daily as needed for pain. With food -     methocarbamol (ROBAXIN) 500 MG tablet; Take 1 tablet (500 mg total) by mouth every 8 (eight) hours as needed for muscle spasms. -     Ambulatory referral to Physical Therapy  Controlled type 2 diabetes mellitus with complication, with long-term current use of insulin (HCC) -     Insulin Glargine (TOUJEO SOLOSTAR) 300 UNIT/ML SOPN; Inject 50 Units into the skin daily. -     Ambulatory referral to Endocrinology  Primary osteoarthritis of both knees -     meloxicam (MOBIC) 7.5 MG tablet; Take 1 tablet (7.5 mg total) by mouth daily as needed for pain. With food -     methocarbamol (ROBAXIN) 500 MG tablet; Take 1 tablet (500 mg total) by mouth every 8 (eight) hours as  needed for muscle spasms. -     Ambulatory referral to Physical Therapy  GAD (generalized anxiety disorder) -     busPIRone (BUSPAR) 7.5 MG tablet; Take 1 tablet (7.5 mg total) by mouth 3 (three) times daily as needed.   I have discontinued Ms. Vereen's metroNIDAZOLE. I am also having her start on meloxicam, methocarbamol, and busPIRone. Additionally, I am having her maintain her aspirin EC, nitroGLYCERIN, Biotin, Insulin Pen Needle, potassium chloride SA, Omega-3 Fatty Acids (FISH OIL PO), liraglutide, glucose blood, ONETOUCH VERIO FLEX SYSTEM, nitroGLYCERIN, atorvastatin, clopidogrel,  diazepam, traMADol, and Insulin Glargine.  Meds ordered this encounter  Medications  . Insulin Glargine (TOUJEO SOLOSTAR) 300 UNIT/ML SOPN    Sig: Inject 50 Units into the skin daily.    Dispense:  1.5 mL    Refill:  1    Order Specific Question:   Supervising Provider    Answer:   Cassandria Anger [[id number]]  . meloxicam (MOBIC) 7.5 MG tablet    Sig: Take 1 tablet (7.5 mg total) by mouth daily as needed for pain. With food    Dispense:  30 tablet    Refill:  0    Order Specific Question:   Supervising Provider    Answer:   Cassandria Anger [[id number]]  . methocarbamol (ROBAXIN) 500 MG tablet    Sig: Take 1 tablet (500 mg total) by mouth every 8 (eight) hours as needed for muscle spasms.    Dispense:  30 tablet    Refill:  0    Order Specific Question:   Supervising Provider    Answer:   Cassandria Anger [[id number]]  . busPIRone (BUSPAR) 7.5 MG tablet    Sig: Take 1 tablet (7.5 mg total) by mouth 3 (three) times daily as needed.    Dispense:  90 tablet    Refill:  0    Order Specific Question:   Supervising Provider    Answer:   Cassandria Anger [[id number]]    Follow-up: Return in about 3 weeks (around 06/22/2017) for with dr. Ronnald Ramp.(fasting).  Wilfred Lacy, NP

## 2017-06-01 NOTE — Patient Instructions (Addendum)
Call [facility] endocrinology for appt [phone].  Discuss hydrocodone and xanax Rx with pcp during upcoming appt.

## 2017-06-23 ENCOUNTER — Other Ambulatory Visit (INDEPENDENT_AMBULATORY_CARE_PROVIDER_SITE_OTHER): Payer: BLUE CROSS/BLUE SHIELD

## 2017-06-23 ENCOUNTER — Ambulatory Visit (INDEPENDENT_AMBULATORY_CARE_PROVIDER_SITE_OTHER): Payer: BLUE CROSS/BLUE SHIELD | Admitting: Internal Medicine

## 2017-06-23 ENCOUNTER — Encounter: Payer: Self-pay | Admitting: Internal Medicine

## 2017-06-23 VITALS — BP 110/68 | HR 96 | Temp 98.3°F | Ht 64.0 in | Wt 221.2 lb

## 2017-06-23 DIAGNOSIS — E1129 Type 2 diabetes mellitus with other diabetic kidney complication: Secondary | ICD-10-CM | POA: Diagnosis not present

## 2017-06-23 DIAGNOSIS — M170 Bilateral primary osteoarthritis of knee: Secondary | ICD-10-CM | POA: Diagnosis not present

## 2017-06-23 DIAGNOSIS — Z794 Long term (current) use of insulin: Secondary | ICD-10-CM

## 2017-06-23 DIAGNOSIS — Z23 Encounter for immunization: Secondary | ICD-10-CM

## 2017-06-23 DIAGNOSIS — R809 Proteinuria, unspecified: Secondary | ICD-10-CM | POA: Diagnosis not present

## 2017-06-23 DIAGNOSIS — M5136 Other intervertebral disc degeneration, lumbar region: Secondary | ICD-10-CM

## 2017-06-23 DIAGNOSIS — E118 Type 2 diabetes mellitus with unspecified complications: Secondary | ICD-10-CM | POA: Diagnosis not present

## 2017-06-23 DIAGNOSIS — I2510 Atherosclerotic heart disease of native coronary artery without angina pectoris: Secondary | ICD-10-CM | POA: Diagnosis not present

## 2017-06-23 DIAGNOSIS — Z1231 Encounter for screening mammogram for malignant neoplasm of breast: Secondary | ICD-10-CM | POA: Insufficient documentation

## 2017-06-23 LAB — MICROALBUMIN / CREATININE URINE RATIO
CREATININE, U: 56 mg/dL
Microalb Creat Ratio: 7.2 mg/g (ref 0.0–30.0)
Microalb, Ur: 4 mg/dL — ABNORMAL HIGH (ref 0.0–1.9)

## 2017-06-23 LAB — POCT GLUCOSE (DEVICE FOR HOME USE): Glucose Fasting, POC: 230 mg/dL — AB (ref 70–99)

## 2017-06-23 LAB — BASIC METABOLIC PANEL
BUN: 7 mg/dL (ref 6–23)
CO2: 26 meq/L (ref 19–32)
Calcium: 9.6 mg/dL (ref 8.4–10.5)
Chloride: 103 mEq/L (ref 96–112)
Creatinine, Ser: 0.5 mg/dL (ref 0.40–1.20)
GFR: 174.76 mL/min (ref >60.00)
GLUCOSE: 208 mg/dL — AB (ref 70–99)
POTASSIUM: 4.4 meq/L (ref 3.5–5.1)
SODIUM: 139 meq/L (ref 135–145)

## 2017-06-23 LAB — POCT GLYCOSYLATED HEMOGLOBIN (HGB A1C): Hemoglobin A1C: 11.7

## 2017-06-23 MED ORDER — INSULIN DEGLUDEC 200 UNIT/ML ~~LOC~~ SOPN: 50.0000 [IU] | PEN_INJECTOR | Freq: Every day | SUBCUTANEOUS | 1 refills | Status: DC

## 2017-06-23 MED ORDER — HYDROCODONE-ACETAMINOPHEN 5-325 MG PO TABS: 1.0000 | ORAL_TABLET | Freq: Four times a day (QID) | ORAL | 0 refills | Status: DC | PRN

## 2017-06-23 MED ORDER — DAPAGLIFLOZIN PROPANEDIOL 5 MG PO TABS: 5.0000 mg | ORAL_TABLET | Freq: Every day | ORAL | 1 refills | Status: DC

## 2017-06-23 MED ORDER — EXENATIDE ER 2 MG/0.85ML ~~LOC~~ AUIJ: 1.0000 | AUTO-INJECTOR | SUBCUTANEOUS | 1 refills | Status: DC

## 2017-06-23 MED ORDER — ACCU-CHEK GUIDE W/DEVICE KIT: 1.0000 | PACK | Freq: Three times a day (TID) | 0 refills | Status: DC

## 2017-06-23 MED ORDER — GLUCOSE BLOOD VI STRP: ORAL_STRIP | 11 refills | Status: DC

## 2017-06-23 NOTE — Patient Instructions (Signed)
Diabetes Mellitus and Standards of Medical Care Managing diabetes (diabetes mellitus) can be complicated. Your diabetes treatment may be managed by a team of health care providers, including:  A diet and nutrition specialist (registered dietitian).  A nurse.  A certified diabetes educator (CDE).  A diabetes specialist (endocrinologist).  An eye doctor.  A primary care provider.  A dentist.  Your health care providers follow a schedule in order to help you get the best quality of care. The following schedule is a general guideline for your diabetes management plan. Your health care providers may also give you more specific instructions. HbA1c ( hemoglobin A1c) test This test provides information about blood sugar (glucose) control over the previous 2-3 months. It is used to check whether your diabetes management plan needs to be adjusted.  If you are meeting your treatment goals, this test is done at least 2 times a year.  If you are not meeting treatment goals or if your treatment goals have changed, this test is done 4 times a year.  Blood pressure test  This test is done at every routine medical visit. For most people, the goal is less than 130/80. Ask your health care provider what your goal blood pressure should be. Dental and eye exams  Visit your dentist two times a year.  If you have type 1 diabetes, get an eye exam 3-5 years after you are diagnosed, and then once a year after your first exam. ? If you were diagnosed with type 1 diabetes as a child, get an eye exam when you are age 16 or older and have had diabetes for 3-5 years. After the first exam, you should get an eye exam once a year.  If you have type 2 diabetes, have an eye exam as soon as you are diagnosed, and then once a year after your first exam. Foot care exam  Visual foot exams are done at every routine medical visit. The exams check for cuts, bruises, redness, blisters, sores, or other problems with the  feet.  A complete foot exam is done by your health care provider once a year. This exam includes an inspection of the structure and skin of your feet, and a check of the pulses and sensation in your feet. ? Type 1 diabetes: Get your first exam 3-5 years after diagnosis. ? Type 2 diabetes: Get your first exam as soon as you are diagnosed.  Check your feet every day for cuts, bruises, redness, blisters, or sores. If you have any of these or other problems that are not healing, contact your health care provider. Kidney function test ( urine microalbumin)  This test is done once a year. ? Type 1 diabetes: Get your first test 5 years after diagnosis. ? Type 2 diabetes: Get your first test as soon as you are diagnosed.  If you have chronic kidney disease (CKD), get a serum creatinine and estimated glomerular filtration rate (eGFR) test once a year. Lipid profile (cholesterol, HDL, LDL, triglycerides)  This test should be done when you are diagnosed with diabetes, and every 5 years after the first test. If you are on medicines to lower your cholesterol, you may need to get this test done every year. ? The goal for LDL is less than 100 mg/dL (5.5 mmol/L). If you are at high risk, the goal is less than 70 mg/dL (3.9 mmol/L). ? The goal for HDL is 40 mg/dL (2.2 mmol/L) for men and 50 mg/dL(2.8 mmol/L) for women. An HDL  cholesterol of 60 mg/dL (3.3 mmol/L) or higher gives some protection against heart disease. ? The goal for triglycerides is less than 150 mg/dL (8.3 mmol/L). Immunizations  The yearly flu (influenza) vaccine is recommended for everyone 6 months or older who has diabetes.  The pneumonia (pneumococcal) vaccine is recommended for everyone 2 years or older who has diabetes. If you are 97 or older, you may get the pneumonia vaccine as a series of two separate shots.  The hepatitis B vaccine is recommended for adults shortly after they have been diagnosed with diabetes.  The Tdap  (tetanus, diphtheria, and pertussis) vaccine should be given: ? According to normal childhood vaccination schedules, for children. ? Every 10 years, for adults who have diabetes.  The shingles vaccine is recommended for people who have had chicken pox and are 50 years or older. Mental and emotional health  Screening for symptoms of eating disorders, anxiety, and depression is recommended at the time of diagnosis and afterward as needed. If your screening shows that you have symptoms (you have a positive screening result), you may need further evaluation and be referred to a mental health care provider. Diabetes self-management education  Education about how to manage your diabetes is recommended at diagnosis and ongoing as needed. Treatment plan  Your treatment plan will be reviewed at every medical visit. Summary  Managing diabetes (diabetes mellitus) can be complicated. Your diabetes treatment may be managed by a team of health care providers.  Your health care providers follow a schedule in order to help you get the best quality of care.  Standards of care including having regular physical exams, blood tests, blood pressure monitoring, immunizations, screening tests, and education about how to manage your diabetes.  Your health care providers may also give you more specific instructions based on your individual health. This information is not intended to replace advice given to you by your health care provider. Make sure you discuss any questions you have with your health care provider. Document Released: 06/21/2009 Document Revised: 05/22/2016 Document Reviewed: 05/22/2016 Elsevier Interactive Patient Education  [employer].

## 2017-06-23 NOTE — Progress Notes (Signed)
Subjective:  Patient ID: Maureen Price, female    DOB: December 15, 1975  Age: 41 y.o. MRN: [id number]  CC: Diabetes   HPI MAKAYELA SECREST presents for f/up - she complains of LBP and knee pain, she has tried MR's and nsaids with no relief. The pain keeps her awake and interferes with her activities. She wants to try an opiate again since that has worked in the past. She is not compliant with meds for DM and does not check her blood sugars.  Outpatient Medications Prior to Visit  Medication Sig Dispense Refill  . aspirin EC 81 MG tablet Take 81 mg by mouth daily. Reported on 09/20/2015    . atorvastatin (LIPITOR) 80 MG tablet TAKE ONE TABLET BY MOUTH DAILY 30 tablet 11  . Biotin 1000 MCG tablet Take 1,000 mcg by mouth 2 (two) times daily. Reported on 09/20/2015    . busPIRone (BUSPAR) 7.5 MG tablet Take 1 tablet (7.5 mg total) by mouth 3 (three) times daily as needed. 90 tablet 0  . clopidogrel (PLAVIX) 75 MG tablet TAKE ONE TABLET BY MOUTH DAILY 30 tablet 11  . Insulin Pen Needle 32G X 5 MM MISC Use pen needle with victoza insulin injector as directed by your physician. 100 each 3  . nitroGLYCERIN (NITROSTAT) 0.4 MG SL tablet Place 1 tablet (0.4 mg total) under the tongue every 5 (five) minutes as needed for chest pain (Hold for SBP <100). 30 tablet 0  . Omega-3 Fatty Acids (FISH OIL PO) Take 1 tablet by mouth daily.    . potassium chloride SA (K-DUR,KLOR-CON) 20 MEQ tablet Take 1 tablet (20 mEq total) by mouth daily. 30 tablet 3  . Blood Glucose Monitoring Suppl (ONETOUCH VERIO FLEX SYSTEM) w/Device KIT 1 Act by Does not apply route 3 (three) times daily. 2 kit 0  . diazepam (VALIUM) 5 MG tablet Take 1 tablet (5 mg total) by mouth 2 (two) times daily. 10 tablet 0  . glucose blood test strip Use TID OneTouch Verio 100 each 12  . Insulin Glargine (TOUJEO SOLOSTAR) 300 UNIT/ML SOPN Inject 50 Units into the skin daily. 1.5 mL 1  . Liraglutide (VICTOZA) 18 MG/3ML SOPN Inject 0.3 mLs (1.8 mg  total) into the skin daily. 3 mL 11  . meloxicam (MOBIC) 7.5 MG tablet Take 1 tablet (7.5 mg total) by mouth daily as needed for pain. With food 30 tablet 0  . methocarbamol (ROBAXIN) 500 MG tablet Take 1 tablet (500 mg total) by mouth every 8 (eight) hours as needed for muscle spasms. 30 tablet 0  . nitroGLYCERIN (NITROSTAT) 0.4 MG SL tablet Place 0.4 mg under the tongue every 5 (five) minutes as needed for chest pain. Reported on 09/20/2015    . traMADol (ULTRAM) 50 MG tablet Take 1 tablet (50 mg total) by mouth every 6 (six) hours as needed. 15 tablet 0   No facility-administered medications prior to visit.     ROS Review of Systems  Constitutional: Negative for appetite change, diaphoresis, fatigue and unexpected weight change.  HENT: Negative.   Eyes: Negative for visual disturbance.  Respiratory: Negative for cough, chest tightness, shortness of breath and wheezing.   Cardiovascular: Negative.  Negative for chest pain, palpitations and leg swelling.  Gastrointestinal: Negative.  Negative for abdominal pain, constipation, diarrhea, nausea and vomiting.  Endocrine: Positive for polydipsia, polyphagia and polyuria.  Genitourinary: Negative.  Negative for difficulty urinating and dysuria.  Musculoskeletal: Positive for arthralgias and back pain. Negative for myalgias and neck  pain.  Skin: Negative.   Allergic/Immunologic: Negative.   Neurological: Negative.  Negative for dizziness, weakness and light-headedness.  Hematological: Negative for adenopathy. Does not bruise/bleed easily.    Objective:  BP 110/68 (BP Location: Left Arm, Patient Position: Sitting, Cuff Size: Large)   Pulse 96   Temp 98.3 F (36.8 C) (Oral)   Ht _0  (1.626 m)   Wt 221 lb 4 oz (100.4 kg)   SpO2 96%   BMI 37.98 kg/m   BP Readings from Last 3 Encounters:  06/23/17 110/68  06/01/17 120/78  05/02/17 123/74    Wt Readings from Last 3 Encounters:  06/23/17 221 lb 4 oz (100.4 kg)  06/01/17 217 lb (98.4  kg)  05/02/17 226 lb (102.5 kg)    Physical Exam  Constitutional: She is oriented to person, place, and time. No distress.  HENT:  Mouth/Throat: Oropharynx is clear and moist. No oropharyngeal exudate.  Eyes: Conjunctivae are normal. Right eye exhibits no discharge. Left eye exhibits no discharge. No scleral icterus.  Neck: Normal range of motion. Neck supple. No JVD present. No tracheal deviation present. No thyromegaly present.  Cardiovascular: Normal rate, regular rhythm and intact distal pulses.  Exam reveals no gallop.   No murmur heard. Pulmonary/Chest: Effort normal and breath sounds normal. No stridor. No respiratory distress. She has no wheezes. She has no rales. She exhibits no tenderness.  Abdominal: Soft. Bowel sounds are normal. She exhibits no distension and no mass. There is no tenderness. There is no rebound and no guarding.  Musculoskeletal: Normal range of motion. She exhibits no edema, tenderness or deformity.  Lymphadenopathy:    She has no cervical adenopathy.  Neurological: She is alert and oriented to person, place, and time.  Skin: Skin is warm and dry. No rash noted. She is not diaphoretic. No erythema. No pallor.  Vitals reviewed.   Lab Results  Component Value Date   WBC 9.2 09/18/2016   HGB 14.0 09/18/2016   HCT 39.5 09/18/2016   PLT 266 09/18/2016   GLUCOSE 208 (H) 06/23/2017   CHOL 150 09/19/2016   TRIG 185 (H) 09/19/2016   HDL 22 (L) 09/19/2016   LDLCALC 91 09/19/2016   ALT 15 05/20/2016   AST 9 05/20/2016   NA 139 06/23/2017   K 4.4 06/23/2017   CL 103 06/23/2017   CREATININE 0.50 06/23/2017   BUN 7 06/23/2017   CO2 26 06/23/2017   TSH 2.40 08/26/2015   INR 1.04 01/16/2015   HGBA1C 11.7 06/23/2017   MICROALBUR 4.0 (H) 06/23/2017    Dg Shoulder 1 View Left  Result Date: 05/02/2017 CLINICAL DATA:  Shoulder injury and pain. EXAM: LEFT SHOULDER - 1 VIEW COMPARISON:  Prior today FINDINGS: Axillary view shows no evidence of shoulder  dislocation. No fracture or other bone abnormality identified. IMPRESSION: Negative. Electronically Signed   By: Earle Gell M.D.   On: 05/02/2017 07:37   Dg Shoulder Left  Result Date: 05/02/2017 CLINICAL DATA:  Shoulder injury EXAM: LEFT SHOULDER - 2+ VIEW COMPARISON:  None. FINDINGS: There is irregularity at the left acromioclavicular joint, poorly visible due to limited patient positioning. Glenohumeral joint is approximated. No humeral fracture. IMPRESSION: Irregular appearance of the acromioclavicular joint on the lateral projection. If the patient can tolerate an axillary view after pain medication, this may be helpful. Electronically Signed   By: Ulyses Jarred M.D.   On: 05/02/2017 06:22    Assessment & Plan:   Ilena was seen today for diabetes.  Diagnoses  and all orders for this visit:  Controlled type 2 diabetes mellitus with complication, with long-term current use of insulin ([place])- her A1C is up to 11.7%, will restart meds and will refer for eduac and ENDO -     Basic metabolic panel; Future -     Cancel: Hemoglobin A1c; Future -     Microalbumin / creatinine urine ratio; Future -     Amb Referral to Nutrition and Diabetic E -     Ambulatory referral to Ophthalmology -     Blood Glucose Monitoring Suppl (ACCU-CHEK GUIDE) w/Device KIT; 1 Act by Does not apply route 3 (three) times daily. -     glucose blood (ACCU-CHEK GUIDE) test strip; Use as instructed -     Exenatide ER (BYDUREON BCISE) 2 MG/0.85ML AUIJ; Inject 1 Act into the skin once a week. -     dapagliflozin propanediol (FARXIGA) 5 MG TABS tablet; Take 5 mg by mouth daily. -     Insulin Degludec (TRESIBA FLEXTOUCH) 200 UNIT/ML SOPN; Inject 50 Units into the skin daily. -     POCT glycosylated hemoglobin (Hb A1C) -     POCT Glucose (Device for Home Use)  Need for influenza vaccination -     Flu Vaccine QUAD 36+ mos IM  Visit for screening mammogram -     MM DIGITAL SCREENING BILATERAL; Future  DDD (degenerative  disc disease), lumbar -     HYDROcodone-acetaminophen (NORCO/VICODIN) 5-325 MG tablet; Take 1 tablet by mouth every 6 (six) hours as needed for moderate pain.  Primary osteoarthritis of both knees -     HYDROcodone-acetaminophen (NORCO/VICODIN) 5-325 MG tablet; Take 1 tablet by mouth every 6 (six) hours as needed for moderate pain.  Coronary artery disease involving native coronary artery of native heart without angina pectoris -     ramipril (ALTACE) 2.5 MG capsule; Take 1 capsule (2.5 mg total) by mouth daily.  Microalbuminuria due to type 2 diabetes mellitus- ([place])- will start an ACEI and improve blood sugar control -     ramipril (ALTACE) 2.5 MG capsule; Take 1 capsule (2.5 mg total) by mouth daily.   I have discontinued Ms. Corsino's liraglutide, glucose blood, ONETOUCH VERIO FLEX SYSTEM, diazepam, traMADol, Insulin Glargine, meloxicam, and methocarbamol. I am also having her start on ACCU-CHEK GUIDE, glucose blood, Exenatide ER, dapagliflozin propanediol, Insulin Degludec, HYDROcodone-acetaminophen, and ramipril. Additionally, I am having her maintain her aspirin EC, Biotin, Insulin Pen Needle, potassium chloride SA, Omega-3 Fatty Acids (FISH OIL PO), nitroGLYCERIN, atorvastatin, clopidogrel, and busPIRone.  Meds ordered this encounter  Medications  . Blood Glucose Monitoring Suppl (ACCU-CHEK GUIDE) w/Device KIT    Sig: 1 Act by Does not apply route 3 (three) times daily.    Dispense:  2 kit    Refill:  0  . glucose blood (ACCU-CHEK GUIDE) test strip    Sig: Use as instructed    Dispense:  100 each    Refill:  11  . Exenatide ER (BYDUREON BCISE) 2 MG/0.85ML AUIJ    Sig: Inject 1 Act into the skin once a week.    Dispense:  12 pen    Refill:  1  . dapagliflozin propanediol (FARXIGA) 5 MG TABS tablet    Sig: Take 5 mg by mouth daily.    Dispense:  90 tablet    Refill:  1  . Insulin Degludec (TRESIBA FLEXTOUCH) 200 UNIT/ML SOPN    Sig: Inject 50 Units into the skin daily.     Dispense:  9 mL    Refill:  1  . HYDROcodone-acetaminophen (NORCO/VICODIN) 5-325 MG tablet    Sig: Take 1 tablet by mouth every 6 (six) hours as needed for moderate pain.    Dispense:  75 tablet    Refill:  0  . ramipril (ALTACE) 2.5 MG capsule    Sig: Take 1 capsule (2.5 mg total) by mouth daily.    Dispense:  90 capsule    Refill:  1     Follow-up: Return in about 4 weeks (around 07/21/2017).  [name], MD

## 2017-06-24 DIAGNOSIS — R809 Proteinuria, unspecified: Secondary | ICD-10-CM

## 2017-06-24 DIAGNOSIS — E1129 Type 2 diabetes mellitus with other diabetic kidney complication: Secondary | ICD-10-CM | POA: Insufficient documentation

## 2017-06-24 MED ORDER — RAMIPRIL 2.5 MG PO CAPS: 2.5000 mg | ORAL_CAPSULE | Freq: Every day | ORAL | 1 refills | Status: DC

## 2017-07-07 ENCOUNTER — Telehealth: Payer: Self-pay | Admitting: Internal Medicine

## 2017-07-07 DIAGNOSIS — M170 Bilateral primary osteoarthritis of knee: Secondary | ICD-10-CM

## 2017-07-07 DIAGNOSIS — M5136 Other intervertebral disc degeneration, lumbar region: Secondary | ICD-10-CM

## 2017-07-07 NOTE — Telephone Encounter (Signed)
Pt called stating that the pharmacy was only able to fill a 5 day supply of her HYDROcodone-acetaminophen (NORCO/VICODIN) 5-325 MG tablet due to the new opioid law. They are needing authorization for this to be refilled.

## 2017-07-08 NOTE — Telephone Encounter (Signed)
Called pharmacy and that stated that they were only able to fill the Norco at 5 days worth (20 tablets) the rest has been voided.

## 2017-07-14 NOTE — Telephone Encounter (Signed)
Pt called checking on this. She wanted to know if this would be able to be sent in. Please advise.

## 2017-07-14 NOTE — Telephone Encounter (Signed)
Can the remaining of the prescript be re-prescribed?   They (pharmacy) only filled 20 tablets of the original prescription.

## 2017-07-15 MED ORDER — HYDROCODONE-ACETAMINOPHEN 5-325 MG PO TABS: 1.0000 | ORAL_TABLET | Freq: Four times a day (QID) | ORAL | 0 refills | Status: DC | PRN

## 2017-07-15 NOTE — Telephone Encounter (Signed)
Printed for PCP to sign

## 2017-07-15 NOTE — Telephone Encounter (Signed)
yes

## 2017-07-15 NOTE — Addendum Note (Signed)
Addended by: Aviva Signs M on: 07/15/2017 04:51 PM   Modules accepted: Orders

## 2017-07-15 NOTE — Telephone Encounter (Signed)
Left detailed message for patient.

## 2017-08-02 ENCOUNTER — Encounter: Payer: Self-pay | Admitting: Internal Medicine

## 2017-08-02 ENCOUNTER — Ambulatory Visit (INDEPENDENT_AMBULATORY_CARE_PROVIDER_SITE_OTHER): Payer: BLUE CROSS/BLUE SHIELD | Admitting: Internal Medicine

## 2017-08-02 VITALS — BP 118/88 | HR 90 | Temp 98.6°F | Ht 64.0 in | Wt 222.0 lb

## 2017-08-02 DIAGNOSIS — E118 Type 2 diabetes mellitus with unspecified complications: Secondary | ICD-10-CM | POA: Diagnosis not present

## 2017-08-02 DIAGNOSIS — Z794 Long term (current) use of insulin: Secondary | ICD-10-CM

## 2017-08-02 MED ORDER — DAPAGLIFLOZIN PROPANEDIOL 10 MG PO TABS: 10.0000 mg | ORAL_TABLET | Freq: Every day | ORAL | 5 refills | Status: DC

## 2017-08-02 NOTE — Progress Notes (Signed)
Subjective:  Patient ID: Maureen Price, female    DOB: 09/02/1976  Age: 41 y.o. MRN: [id number]  CC: Diabetes   HPI Maureen Price presents for f/up on DM2.  She has been compliant with the combination of the GLP-1 agonist, basal insulin, and SGLT-2 inhibitor.  She is happy to report that her blood sugars have consistently been less than 200.  Her most recent blood sugar was 148 after a meal.  She's had no side effects.  Outpatient Medications Prior to Visit  Medication Sig Dispense Refill  . aspirin EC 81 MG tablet Take 81 mg by mouth daily. Reported on 09/20/2015    . atorvastatin (LIPITOR) 80 MG tablet TAKE ONE TABLET BY MOUTH DAILY 30 tablet 11  . Biotin 1000 MCG tablet Take 1,000 mcg by mouth 2 (two) times daily. Reported on 09/20/2015    . Blood Glucose Monitoring Suppl (ACCU-CHEK GUIDE) w/Device KIT 1 Act by Does not apply route 3 (three) times daily. 2 kit 0  . busPIRone (BUSPAR) 7.5 MG tablet Take 1 tablet (7.5 mg total) by mouth 3 (three) times daily as needed. 90 tablet 0  . clopidogrel (PLAVIX) 75 MG tablet TAKE ONE TABLET BY MOUTH DAILY 30 tablet 11  . Exenatide ER (BYDUREON BCISE) 2 MG/0.85ML AUIJ Inject 1 Act into the skin once a week. 12 pen 1  . glucose blood (ACCU-CHEK GUIDE) test strip Use as instructed 100 each 11  . HYDROcodone-acetaminophen (NORCO/VICODIN) 5-325 MG tablet Take 1 tablet every 6 (six) hours as needed by mouth for moderate pain. 55 tablet 0  . Insulin Degludec (TRESIBA FLEXTOUCH) 200 UNIT/ML SOPN Inject 50 Units into the skin daily. 9 mL 1  . Insulin Pen Needle 32G X 5 MM MISC Use pen needle with victoza insulin injector as directed by your physician. 100 each 3  . nitroGLYCERIN (NITROSTAT) 0.4 MG SL tablet Place 1 tablet (0.4 mg total) under the tongue every 5 (five) minutes as needed for chest pain (Hold for SBP <100). 30 tablet 0  . Omega-3 Fatty Acids (FISH OIL PO) Take 1 tablet by mouth daily.    . potassium chloride SA (K-DUR,KLOR-CON) 20  MEQ tablet Take 1 tablet (20 mEq total) by mouth daily. 30 tablet 3  . ramipril (ALTACE) 2.5 MG capsule Take 1 capsule (2.5 mg total) by mouth daily. 90 capsule 1  . dapagliflozin propanediol (FARXIGA) 5 MG TABS tablet Take 5 mg by mouth daily. 90 tablet 1   No facility-administered medications prior to visit.     ROS Review of Systems  Constitutional: Negative.  Negative for diaphoresis, fatigue and unexpected weight change.  HENT: Negative.   Eyes: Negative.   Respiratory: Negative.  Negative for cough, chest tightness, shortness of breath and wheezing.   Cardiovascular: Negative for chest pain, palpitations and leg swelling.  Gastrointestinal: Negative.  Negative for abdominal pain, constipation, diarrhea, nausea and vomiting.  Endocrine: Negative.  Negative for polydipsia, polyphagia and polyuria.  Genitourinary: Negative.  Negative for difficulty urinating, frequency, hematuria and urgency.  Musculoskeletal: Negative.  Negative for arthralgias, back pain, myalgias and neck pain.  Skin: Negative.  Negative for color change.  Allergic/Immunologic: Negative.   Neurological: Negative.  Negative for dizziness, weakness and headaches.  Hematological: Negative for adenopathy. Does not bruise/bleed easily.  Psychiatric/Behavioral: Negative.     Objective:  BP 118/88   Pulse 90   Temp 98.6 F (37 C) (Oral)   Ht _0  (1.626 m)   Wt 222 lb (100.7  kg)   SpO2 98%   BMI 38.11 kg/m   BP Readings from Last 3 Encounters:  08/02/17 118/88  06/23/17 110/68  06/01/17 120/78    Wt Readings from Last 3 Encounters:  08/02/17 222 lb (100.7 kg)  06/23/17 221 lb 4 oz (100.4 kg)  06/01/17 217 lb (98.4 kg)    Physical Exam  Constitutional: She is oriented to person, place, and time. No distress.  HENT:  Mouth/Throat: Oropharynx is clear and moist. No oropharyngeal exudate.  Eyes: Conjunctivae are normal. Right eye exhibits no discharge. Left eye exhibits no discharge. No scleral  icterus.  Neck: Normal range of motion. Neck supple. No JVD present. No thyromegaly present.  Cardiovascular: Normal rate, regular rhythm and intact distal pulses. Exam reveals no gallop.  No murmur heard. Pulmonary/Chest: Effort normal and breath sounds normal. No respiratory distress. She has no wheezes. She has no rales. She exhibits no tenderness.  Abdominal: Soft. Bowel sounds are normal. She exhibits no distension and no mass. There is no tenderness. There is no rebound and no guarding.  Musculoskeletal: Normal range of motion. She exhibits no edema, tenderness or deformity.  Neurological: She is alert and oriented to person, place, and time.  Skin: Skin is warm and dry. No rash noted. She is not diaphoretic. No erythema. No pallor.  Vitals reviewed.   Lab Results  Component Value Date   WBC 9.2 09/18/2016   HGB 14.0 09/18/2016   HCT 39.5 09/18/2016   PLT 266 09/18/2016   GLUCOSE 208 (H) 06/23/2017   CHOL 150 09/19/2016   TRIG 185 (H) 09/19/2016   HDL 22 (L) 09/19/2016   LDLCALC 91 09/19/2016   ALT 15 05/20/2016   AST 9 05/20/2016   NA 139 06/23/2017   K 4.4 06/23/2017   CL 103 06/23/2017   CREATININE 0.50 06/23/2017   BUN 7 06/23/2017   CO2 26 06/23/2017   TSH 2.40 08/26/2015   INR 1.04 01/16/2015   HGBA1C 11.7 06/23/2017   MICROALBUR 4.0 (H) 06/23/2017    Dg Shoulder 1 View Left  Result Date: 05/02/2017 CLINICAL DATA:  Shoulder injury and pain. EXAM: LEFT SHOULDER - 1 VIEW COMPARISON:  Prior today FINDINGS: Axillary view shows no evidence of shoulder dislocation. No fracture or other bone abnormality identified. IMPRESSION: Negative. Electronically Signed   By: Earle Gell M.D.   On: 05/02/2017 07:37   Dg Shoulder Left  Result Date: 05/02/2017 CLINICAL DATA:  Shoulder injury EXAM: LEFT SHOULDER - 2+ VIEW COMPARISON:  None. FINDINGS: There is irregularity at the left acromioclavicular joint, poorly visible due to limited patient positioning. Glenohumeral joint is  approximated. No humeral fracture. IMPRESSION: Irregular appearance of the acromioclavicular joint on the lateral projection. If the patient can tolerate an axillary view after pain medication, this may be helpful. Electronically Signed   By: Ulyses Jarred M.D.   On: 05/02/2017 06:22    Assessment & Plan:   Maureen Price was seen today for diabetes.  Diagnoses and all orders for this visit:  Controlled type 2 diabetes mellitus with complication, with long-term current use of insulin ([place])- I have recommended that she increase the dose of the SGL T-2 inhibitor but continue the GLP-1 agonist and basal insulin at the current doses.  She also agrees to improve her lifestyle modifications. -     dapagliflozin propanediol (FARXIGA) 10 MG TABS tablet; Take 10 mg by mouth daily.   I have discontinued Maureen Price's dapagliflozin propanediol. I am also having her start on  dapagliflozin propanediol. Additionally, I am having her maintain her aspirin EC, Biotin, Insulin Pen Needle, potassium chloride SA, Omega-3 Fatty Acids (FISH OIL PO), nitroGLYCERIN, atorvastatin, clopidogrel, busPIRone, ACCU-CHEK GUIDE, glucose blood, Exenatide ER, Insulin Degludec, ramipril, and HYDROcodone-acetaminophen.  Meds ordered this encounter  Medications  . dapagliflozin propanediol (FARXIGA) 10 MG TABS tablet    Sig: Take 10 mg by mouth daily.    Dispense:  30 tablet    Refill:  5     Follow-up: Return in about 4 months (around 11/30/2017).  [name], MD

## 2017-08-02 NOTE — Patient Instructions (Signed)

## 2017-08-11 ENCOUNTER — Encounter: Payer: Self-pay | Admitting: Internal Medicine

## 2017-08-12 ENCOUNTER — Telehealth: Payer: Self-pay | Admitting: *Deleted

## 2017-08-12 NOTE — Telephone Encounter (Signed)
PA approved for [place]. Pharmacy notified-patient notified.

## 2017-08-19 ENCOUNTER — Telehealth: Payer: Self-pay | Admitting: Internal Medicine

## 2017-08-19 NOTE — Telephone Encounter (Signed)
Copied from [place]. Topic: Quick Communication - Rx Refill/Question >> Aug 19, 2017 11:48 AM Neva Seat wrote: Pt needs to come by office to receive the hard copy refill request. Norco 5.325   Pt has 1 left

## 2017-08-20 ENCOUNTER — Telehealth: Payer: Self-pay | Admitting: Internal Medicine

## 2017-08-20 ENCOUNTER — Other Ambulatory Visit: Payer: Self-pay | Admitting: Internal Medicine

## 2017-08-20 DIAGNOSIS — M170 Bilateral primary osteoarthritis of knee: Secondary | ICD-10-CM

## 2017-08-20 DIAGNOSIS — M5136 Other intervertebral disc degeneration, lumbar region: Secondary | ICD-10-CM

## 2017-08-20 MED ORDER — HYDROCODONE-ACETAMINOPHEN 5-325 MG PO TABS: 1.0000 | ORAL_TABLET | Freq: Four times a day (QID) | ORAL | 0 refills | Status: DC | PRN

## 2017-08-20 NOTE — Telephone Encounter (Signed)
Notified pt rx has been sent to your local pharmacy.../lmb  

## 2017-08-20 NOTE — Telephone Encounter (Signed)
Patient will be made aware we can send to pharmacy

## 2017-08-20 NOTE — Telephone Encounter (Signed)
Check [place] registry last filled 07/23/2017...Maureen Price

## 2017-08-20 NOTE — Telephone Encounter (Signed)
Called the [facility] and spoke with the Flow Coordinator regarding Norco refill.    They will send it into the pharmacy.   She does not have to come by the office and pick up a hard copy.     I called the pt and let her know the office would contact her when the Rx is approved and to go to the pharmacy not the office to get the [place].

## 2017-09-24 ENCOUNTER — Telehealth: Payer: Self-pay | Admitting: Internal Medicine

## 2017-09-24 NOTE — Telephone Encounter (Signed)
Copied from [place] [phone]. Topic: Quick Communication - See Telephone Encounter >> Sep 24, 2017  1:09 PM Ether Griffins B wrote: CRM for notification. See Telephone encounter for:  Pt needing refill on hydrocodone. Pt has 2 left  09/24/17.

## 2017-09-25 ENCOUNTER — Other Ambulatory Visit: Payer: Self-pay | Admitting: Internal Medicine

## 2017-09-25 DIAGNOSIS — M5136 Other intervertebral disc degeneration, lumbar region: Secondary | ICD-10-CM

## 2017-09-25 DIAGNOSIS — M170 Bilateral primary osteoarthritis of knee: Secondary | ICD-10-CM

## 2017-09-25 MED ORDER — HYDROCODONE-ACETAMINOPHEN 5-325 MG PO TABS: 1.0000 | ORAL_TABLET | Freq: Four times a day (QID) | ORAL | 0 refills | Status: DC | PRN

## 2017-09-25 NOTE — Telephone Encounter (Signed)
RX sent

## 2017-09-27 NOTE — Telephone Encounter (Signed)
Left detailed message on pt mobile number ?

## 2017-10-25 ENCOUNTER — Emergency Department (HOSPITAL_COMMUNITY): Payer: Self-pay

## 2017-10-25 ENCOUNTER — Encounter (HOSPITAL_COMMUNITY): Payer: Self-pay | Admitting: Emergency Medicine

## 2017-10-25 ENCOUNTER — Emergency Department (HOSPITAL_COMMUNITY)
Admission: EM | Admit: 2017-10-25 | Discharge: 2017-10-25 | Disposition: A | Discharge status: Home / Self Care | Payer: Self-pay | Attending: Emergency Medicine | Admitting: Emergency Medicine

## 2017-10-25 ENCOUNTER — Other Ambulatory Visit: Payer: Self-pay

## 2017-10-25 DIAGNOSIS — M533 Sacrococcygeal disorders, not elsewhere classified: Secondary | ICD-10-CM | POA: Insufficient documentation

## 2017-10-25 DIAGNOSIS — Z955 Presence of coronary angioplasty implant and graft: Secondary | ICD-10-CM | POA: Insufficient documentation

## 2017-10-25 DIAGNOSIS — Y999 Unspecified external cause status: Secondary | ICD-10-CM | POA: Insufficient documentation

## 2017-10-25 DIAGNOSIS — I252 Old myocardial infarction: Secondary | ICD-10-CM | POA: Insufficient documentation

## 2017-10-25 DIAGNOSIS — J45909 Unspecified asthma, uncomplicated: Secondary | ICD-10-CM | POA: Insufficient documentation

## 2017-10-25 DIAGNOSIS — Y929 Unspecified place or not applicable: Secondary | ICD-10-CM | POA: Insufficient documentation

## 2017-10-25 DIAGNOSIS — F17210 Nicotine dependence, cigarettes, uncomplicated: Secondary | ICD-10-CM | POA: Insufficient documentation

## 2017-10-25 DIAGNOSIS — I2510 Atherosclerotic heart disease of native coronary artery without angina pectoris: Secondary | ICD-10-CM | POA: Insufficient documentation

## 2017-10-25 DIAGNOSIS — W0110XA Fall on same level from slipping, tripping and stumbling with subsequent striking against unspecified object, initial encounter: Secondary | ICD-10-CM | POA: Insufficient documentation

## 2017-10-25 DIAGNOSIS — I509 Heart failure, unspecified: Secondary | ICD-10-CM | POA: Insufficient documentation

## 2017-10-25 DIAGNOSIS — E119 Type 2 diabetes mellitus without complications: Secondary | ICD-10-CM | POA: Insufficient documentation

## 2017-10-25 DIAGNOSIS — Y9351 Activity, roller skating (inline) and skateboarding: Secondary | ICD-10-CM | POA: Insufficient documentation

## 2017-10-25 MED ORDER — IBUPROFEN 400 MG PO TABS: 400.0000 mg | ORAL_TABLET | Freq: Four times a day (QID) | ORAL | 0 refills | Status: DC

## 2017-10-25 MED ORDER — HYDROMORPHONE HCL 1 MG/ML IJ SOLN
1.0000 mg | Freq: Once | INTRAMUSCULAR | Status: AC
  Administered 2017-10-25: 1 mg via INTRAMUSCULAR
  Filled 2017-10-25: qty 1

## 2017-10-25 MED ORDER — KETOROLAC TROMETHAMINE 60 MG/2ML IM SOLN
60.0000 mg | Freq: Once | INTRAMUSCULAR | Status: AC
  Administered 2017-10-25: 60 mg via INTRAMUSCULAR
  Filled 2017-10-25: qty 2

## 2017-10-25 MED ORDER — OXYCODONE HCL 5 MG PO TABS: 5.0000 mg | ORAL_TABLET | Freq: Four times a day (QID) | ORAL | 0 refills | Status: DC | PRN

## 2017-10-25 NOTE — ED Triage Notes (Signed)
Pt states she was roller skating and fell hurting tailbone area.

## 2017-10-25 NOTE — ED Provider Notes (Signed)
Emergency Department Provider Note   I have reviewed the triage vital signs and the nursing notes.   HISTORY  Chief Complaint Fall (roller skating)   HPI Maureen Price is a 42 y.o. female who was rollerskating last night when she fell down hurting her tailbone.  Pain seems to be it worsened overnight.  She has a faulty walking secondary to pain and also difficulty sitting.  She took one Vicodin at home which did not seem to help so she took a second 1 which also did not help and thus presents here.  She has no lower extremity weakness or numbness.  No incontinence.  No pain elsewhere.  Did not injure anything else in the fall. No other associated or modifying symptoms.    Past Medical History:  Diagnosis Date  . Acute myocardial infarction of other anterior wall, initial episode of care   . Anginal pain ([place])   . Anxiety   . Arthritis   . Asthma   . CAD (coronary artery disease)   . CHF (congestive heart failure) ([place])   . Depression   . Diabetes mellitus    2002; no meds since 2004  . Dyslipidemia, goal LDL below 70 10/26/2013  . Dysrhythmia   . GERD (gastroesophageal reflux disease)   . Headache(784.0)   . Obesity   . Paroxysmal ventricular tachycardia ([place])   . Shortness of breath   . STEMI (ST elevation myocardial infarction) ([place])   . Tobacco abuse     Patient Active Problem List   Diagnosis Date Noted  . Microalbuminuria due to type 2 diabetes mellitus ([place]) 06/24/2017  . Visit for screening mammogram 06/23/2017  . Asthma 09/18/2016  . DDD (degenerative disc disease), lumbar 01/02/2015  . Atherosclerosis of native artery of extremity with intermittent claudication ([place]) 03/05/2014  . Tobacco use disorder 03/05/2014  . Coronary artery disease 11/06/2013  . Dyslipidemia, goal LDL below 70 10/26/2013  . Obesity   . STEMI (ST elevation myocardial infarction), PTCA/Stent DES- Xience Alpine- LAD, large burden thrombus in LAD and RCA  10/24/2013  .  Diabetes mellitus type 2, controlled, with complications ([place]) 51/88/4166  . DJD (degenerative joint disease) of knee 07/07/2012    Past Surgical History:  Procedure Laterality Date  . CESAREAN SECTION    . CESAREAN SECTION  1994,1997,2000  . CHOLECYSTECTOMY    . CORONARY ANGIOPLASTY WITH STENT PLACEMENT    . GALLBLADDER SURGERY    . LEFT HEART CATH N/A 10/24/2013   Procedure: LEFT HEART CATH;  Surgeon: Lorretta Harp, MD;  Location[facility] C[facility] LAB;  Service: Cardiovascular;  Laterality: N/A;  . LEFT HEART CATHETERIZATION WITH CORONARY ANGIOGRAM N/A 10/26/2013   Procedure: LEFT HEART CATHETERIZATION WITH CORONARY ANGIOGRAM;  Surgeon: Burnell Blanks, MD;  Location[facility] CATH LAB;  Service: Cardiovascular;  Laterality: N/A;  . LEFT HEART CATHETERIZATION WITH CORONARY ANGIOGRAM N/A 03/29/2014   Procedure: LEFT HEART CATHETERIZATION WITH CORONARY ANGIOGRAM;  Surgeon: Lorretta Harp, MD;  Location: [facility] CATH LAB;  Service: Cardiovascular;  Laterality: N/A;  . TUBAL LIGATION     2000    Current Outpatient Rx  . Order #: [id number] Class: Normal  . Order #: [id number] Class: Normal  . Order #: [id number] Class: Normal  . Order #: [id number] Class: Normal  . Order #: [id number] Class: Historical Med  . Order #: [id number] Class: Historical Med  . Order #: [id number] Class: Print  . Order #: [id number] Class: Normal  . Order #: [id number] Class: Print    Allergies Erythromycin; Penicillins; Metformin and  related; and Viibryd [vilazodone hcl]  Family History  Problem Relation Age of Onset  . Arthritis Mother   . Diabetes Mother   . Heart disease Father   . Stroke Father   . Diabetes Maternal Grandfather   . Cancer Neg Hx   . Alcohol abuse Neg Hx   . Early death Neg Hx   . Kidney disease Neg Hx   . Hypertension Neg Hx   . Hyperlipidemia Neg Hx     Social History Social History   Tobacco Use  . Smoking status: Current Every Day Smoker    Packs/day: 0.50    Years: 20.00    Pack years:  10.00    Types: Cigarettes  . Smokeless tobacco: Never Used  . Tobacco comment: 3 cigs/day  Substance Use Topics  . Alcohol use: No    Alcohol/week: 0.0 oz    Comment: occasional  . Drug use: No    Review of Systems  All other systems negative except as documented in the HPI. All pertinent positives and negatives as reviewed in the HPI. ____________________________________________   PHYSICAL EXAM:  VITAL SIGNS: ED Triage Vitals  Enc Vitals Group     BP 10/25/17 0747 126/88     Pulse Rate 10/25/17 0747 (!) 105     Resp 10/25/17 0747 20     Temp 10/25/17 0747 97.9 F (36.6 C)     Temp Source 10/25/17 0747 Oral     SpO2 10/25/17 0747 98 %     Weight 10/25/17 0745 218 lb (98.9 kg)    Constitutional: Alert and oriented. Well appearing and in no acute distress. Eyes: Conjunctivae are normal. PERRL. EOMI. Head: Atraumatic. Nose: No congestion/rhinnorhea. Mouth/Throat: Mucous membranes are moist.  Oropharynx non-erythematous. Neck: No stridor.  No meningeal signs.   Cardiovascular: Normal rate, regular rhythm. Good peripheral circulation. Grossly normal heart sounds.   Respiratory: Normal respiratory effort.  No retractions. Lungs CTAB. Gastrointestinal: Soft and nontender. No distention.  Musculoskeletal: No lower extremity tenderness nor edema. No gross deformities of extremities.  Does have tenderness over coccyx area with no obvious deformities or swelling.  Able to ambulate slowly but without obvious abnormalities. Neurologic:  Normal speech and language. No gross focal neurologic deficits are appreciated.  Skin:  Skin is warm, dry and intact. No rash noted.   ____________________________________________   [id number]  Dg Sacrum/coccyx  Result Date: 10/25/2017 CLINICAL DATA:  Pain following fall EXAM: SACRUM AND COCCYX - 2+ VIEW COMPARISON:  None. FINDINGS: Frontal, tilt frontal, and lateral views were obtained. No evident fracture or diastases. There is mild sclerosis  along the inferior aspect of the right iliac bone along the inferolateral aspect of the right sacroiliac joint. No sacroiliitis. No appreciable joint space narrowing. 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 No fracture or diastases. Study otherwise unremarkable. Electronically Signed   By: Lowella Grip III M.D.   On: 10/25/2017 08:56    ____________________________________________   INITIAL IMPRESSION / ASSESSMENT AND PLAN / ED COURSE  Suspect coccyx fracture versus contusion.  Will x-ray, pain medications and disposition appropriately.  xrr negative. Pain control. Discharge.     Pertinent labs & imaging results that were available during my care of the patient were reviewed by me and considered in my medical decision making (see chart for details).  ____________________________________________  FINAL CLINICAL IMPRESSION(S) / ED DIAGNOSES  Final diagnoses:  Pain in the coccyx     MEDICATIONS GIVEN DURING THIS VISIT:  Medications  HYDROmorphone (DILAUDID)  injection 1 mg (1 mg Intramuscular Given 10/25/17 0810)  ketorolac (TORADOL) injection 60 mg (60 mg Intramuscular Given 10/25/17 0810)     NEW OUTPATIENT MEDICATIONS STARTED DURING THIS VISIT:  Discharge Medication List as of 10/25/2017  9:46 AM    START taking these medications   Details  ibuprofen (ADVIL,MOTRIN) 400 MG tablet Take 1 tablet (400 mg total) by mouth 4 (four) times daily for 5 days., Starting Mon 10/25/2017, Until Sat 10/30/2017, Print    oxyCODONE (ROXICODONE) 5 MG immediate release tablet Take 1-2 tablets (5-10 mg total) by mouth every 6 (six) hours as needed for breakthrough pain., Starting Mon 10/25/2017, Print        Note:  This note was prepared with assistance of Dragon voice recognition software. Occasional wrong-word or sound-a-like substitutions may have occurred due to the inherent limitations of voice recognition software.   Merrily Pew, MD 10/25/17 [phone]

## 2017-10-25 NOTE — ED Notes (Signed)
Pt could not reach signature pad from wheelchair.  Verbalized understanding of instructions.

## 2017-10-28 ENCOUNTER — Emergency Department (HOSPITAL_COMMUNITY)
Admission: EM | Admit: 2017-10-28 | Discharge: 2017-10-28 | Disposition: A | Discharge status: Home / Self Care | Payer: Self-pay | Attending: Emergency Medicine | Admitting: Emergency Medicine

## 2017-10-28 ENCOUNTER — Emergency Department (HOSPITAL_COMMUNITY): Payer: Self-pay

## 2017-10-28 ENCOUNTER — Encounter (HOSPITAL_COMMUNITY): Payer: Self-pay | Admitting: *Deleted

## 2017-10-28 ENCOUNTER — Other Ambulatory Visit: Payer: Self-pay

## 2017-10-28 DIAGNOSIS — I509 Heart failure, unspecified: Secondary | ICD-10-CM | POA: Insufficient documentation

## 2017-10-28 DIAGNOSIS — Z794 Long term (current) use of insulin: Secondary | ICD-10-CM | POA: Insufficient documentation

## 2017-10-28 DIAGNOSIS — J45909 Unspecified asthma, uncomplicated: Secondary | ICD-10-CM | POA: Insufficient documentation

## 2017-10-28 DIAGNOSIS — Y9351 Activity, roller skating (inline) and skateboarding: Secondary | ICD-10-CM | POA: Insufficient documentation

## 2017-10-28 DIAGNOSIS — E119 Type 2 diabetes mellitus without complications: Secondary | ICD-10-CM | POA: Insufficient documentation

## 2017-10-28 DIAGNOSIS — S300XXA Contusion of lower back and pelvis, initial encounter: Secondary | ICD-10-CM | POA: Insufficient documentation

## 2017-10-28 DIAGNOSIS — Y929 Unspecified place or not applicable: Secondary | ICD-10-CM | POA: Insufficient documentation

## 2017-10-28 DIAGNOSIS — Y999 Unspecified external cause status: Secondary | ICD-10-CM | POA: Insufficient documentation

## 2017-10-28 DIAGNOSIS — F17210 Nicotine dependence, cigarettes, uncomplicated: Secondary | ICD-10-CM | POA: Insufficient documentation

## 2017-10-28 DIAGNOSIS — I2510 Atherosclerotic heart disease of native coronary artery without angina pectoris: Secondary | ICD-10-CM | POA: Insufficient documentation

## 2017-10-28 DIAGNOSIS — Z79899 Other long term (current) drug therapy: Secondary | ICD-10-CM | POA: Insufficient documentation

## 2017-10-28 MED ORDER — DICLOFENAC SODIUM 75 MG PO TBEC: 75.0000 mg | DELAYED_RELEASE_TABLET | Freq: Two times a day (BID) | ORAL | 0 refills | Status: DC

## 2017-10-28 MED ORDER — HYDROCODONE-ACETAMINOPHEN 5-325 MG PO TABS: 2.0000 | ORAL_TABLET | ORAL | 0 refills | Status: DC | PRN

## 2017-10-28 MED ORDER — KETOROLAC TROMETHAMINE 60 MG/2ML IM SOLN
60.0000 mg | Freq: Once | INTRAMUSCULAR | Status: AC
  Administered 2017-10-28: 60 mg via INTRAMUSCULAR
  Filled 2017-10-28: qty 2

## 2017-10-28 MED ORDER — HYDROMORPHONE HCL 1 MG/ML IJ SOLN
1.0000 mg | Freq: Once | INTRAMUSCULAR | Status: AC
  Administered 2017-10-28: 1 mg via INTRAMUSCULAR
  Filled 2017-10-28: qty 1

## 2017-10-28 NOTE — ED Provider Notes (Signed)
[facility] EMERGENCY DEPARTMENT Provider Note   CSN: [id number] Arrival date & time: 10/28/17  0800     History   Chief Complaint Chief Complaint  Patient presents with  . Tailbone Pain    HPI Maureen Price is a 42 y.o. female.  The history is provided by the patient. No language interpreter was used.  Back Pain   This is a new problem. The current episode started more than 2 days ago. The problem occurs constantly. The problem has been gradually worsening. The pain is associated with falling. Pain location: coccyx. The quality of the pain is described as stabbing. The pain is moderate. The pain is worse during the day. She has tried nothing for the symptoms.    Past Medical History:  Diagnosis Date  . Acute myocardial infarction of other anterior wall, initial episode of care   . Anginal pain ([place])   . Anxiety   . Arthritis   . Asthma   . CAD (coronary artery disease)   . CHF (congestive heart failure) ([place])   . Depression   . Diabetes mellitus    2002; no meds since 2004  . Dyslipidemia, goal LDL below 70 10/26/2013  . Dysrhythmia   . GERD (gastroesophageal reflux disease)   . Headache(784.0)   . Obesity   . Paroxysmal ventricular tachycardia ([place])   . Shortness of breath   . STEMI (ST elevation myocardial infarction) ([place])   . Tobacco abuse     Patient Active Problem List   Diagnosis Date Noted  . Microalbuminuria due to type 2 diabetes mellitus ([place]) 06/24/2017  . Visit for screening mammogram 06/23/2017  . Asthma 09/18/2016  . DDD (degenerative disc disease), lumbar 01/02/2015  . Atherosclerosis of native artery of extremity with intermittent claudication ([place]) 03/05/2014  . Tobacco use disorder 03/05/2014  . Coronary artery disease 11/06/2013  . Dyslipidemia, goal LDL below 70 10/26/2013  . Obesity   . STEMI (ST elevation myocardial infarction), PTCA/Stent DES- Xience [place]pine- LAD, large burden thrombus in LAD and RCA  10/24/2013   . Diabetes mellitus type 2, controlled, with complications ([place]) 46/65/9935  . DJD (degenerative joint disease) of knee 07/07/2012    Past Surgical History:  Procedure Laterality Date  . CESAREAN SECTION    . CESAREAN SECTION  1994,1997,2000  . CHOLECYSTECTOMY    . CORONARY ANGIOPLASTY WITH STENT PLACEMENT    . GALLBLADDER SURGERY    . LEFT HEART CATH N/A 10/24/2013   Procedure: LEFT HEART CATH;  Surgeon: Lorretta Harp, MD;  Location: [facility] CATH LAB;  Service: Cardiovascular;  Laterality: N/A;  . LEFT HEART CATHETERIZATION WITH CORONARY ANGIOGRAM N/A 10/26/2013   Procedure: LEFT HEART CATHETERIZATION WITH CORONARY ANGIOGRAM;  Surgeon: Burnell Blanks, MD;  Location: [facility] CATH LAB;  Service: Cardiovascular;  Laterality: N/A;  . LEFT HEART CATHETERIZATION WITH CORONARY ANGIOGRAM N/A 03/29/2014   Procedure: LEFT HEART CATHETERIZATION WITH CORONARY ANGIOGRAM;  Surgeon: Lorretta Harp, MD;  Location: [facility] CATH LAB;  Service: Cardiovascular;  Laterality: N/A;  . TUBAL LIGATION     2000    OB History    Gravida Para Term Preterm AB Living   4 3 3   1 3    SAB TAB Ectopic Multiple Live Births   1               Home Medications    Prior to Admission medications   Medication Sig Start Date End Date Taking? Authorizing Provider  dapagliflozin propanediol (FARXIGA)  10 MG TABS tablet Take 10 mg by mouth daily. 08/02/17   [name], MD  Exenatide ER (BYDUREON BCISE) 2 MG/0.85ML AUIJ Inject 1 Act into the skin once a week. 06/23/17   [name], MD  HYDROcodone-acetaminophen (NORCO/VICODIN) 5-325 MG tablet Take 1 tablet by mouth every 6 (six) hours as needed for moderate pain. 09/25/17   [name], MD  ibuprofen (ADVIL,MOTRIN) 400 MG tablet Take 1 tablet (400 mg total) by mouth 4 (four) times daily for 5 days. 10/25/17 10/30/17  Mesner, Corene Cornea, MD  Insulin Degludec (TRESIBA FLEXTOUCH) 200 UNIT/ML SOPN Inject 50 Units into the skin daily. 06/23/17   [name], MD    nitroGLYCERIN (NITROSTAT) 0.4 MG SL tablet Place 1 tablet (0.4 mg total) under the tongue every 5 (five) minutes as needed for chest pain (Hold for SBP <100). 09/19/16   Elwin Mocha, MD  Omega-3 Fatty Acids (FISH OIL PO) Take 1 tablet by mouth daily.    [provider]  oxyCODONE (ROXICODONE) 5 MG immediate release tablet Take 1-2 tablets (5-10 mg total) by mouth every 6 (six) hours as needed for breakthrough pain. 10/25/17   Mesner, Corene Cornea, MD  Turmeric 500 MG TABS Take 1 tablet by mouth daily.    [provider]    Family History Family History  Problem Relation Age of Onset  . Arthritis Mother   . Diabetes Mother   . Heart disease Father   . Stroke Father   . Diabetes Maternal Grandfather   . Cancer Neg Hx   . Alcohol abuse Neg Hx   . Early death Neg Hx   . Kidney disease Neg Hx   . Hypertension Neg Hx   . Hyperlipidemia Neg Hx     Social History Social History   Tobacco Use  . Smoking status: Current Every Day Smoker    Packs/day: 0.50    Years: 20.00    Pack years: 10.00    Types: Cigarettes  . Smokeless tobacco: Never Used  . Tobacco comment: 3 cigs/day  Substance Use Topics  . Alcohol use: No    Alcohol/week: 0.0 oz    Comment: occasional  . Drug use: No     Allergies   Erythromycin; Penicillins; Metformin and related; and Viibryd [vilazodone hcl]   Review of Systems Review of Systems  Musculoskeletal: Positive for back pain.  All other systems reviewed and are negative.    Physical Exam Updated Vital Signs BP 139/80 (BP Location: Right Arm)   Pulse (!) 103   Temp 98.3 F (36.8 C) (Oral)   Resp (!) 22   LMP 10/22/2017 (Exact Date) Comment: Tubal Ligation  SpO2 98%   Physical Exam  Constitutional: She appears well-developed and well-nourished. No distress.  HENT:  Head: Normocephalic and atraumatic.  Eyes: Conjunctivae are normal.  Neck: Neck supple.  Cardiovascular: Normal rate and regular rhythm.  No murmur  heard. Pulmonary/Chest: Effort normal and breath sounds normal. No respiratory distress.  Abdominal: Soft. There is no tenderness.  Musculoskeletal: She exhibits no edema.  Tender coccyx to palpation,  Tender bilat pelvic bones,   Neurological: She is alert.  Skin: Skin is warm and dry.  Psychiatric: She has a normal mood and affect.  Nursing note and vitals reviewed.    ED Treatments / Results  Labs (all labs ordered are listed, but only abnormal results are displayed) Labs Reviewed - No data to display  EKG  EKG Interpretation None  Radiology Dg Pelvis 1-2 Views  Result Date: 10/28/2017 CLINICAL DATA:  Pain following fall EXAM: PELVIS - 1-2 VIEW COMPARISON:  None. FINDINGS: Standing frontal view obtained. No fracture or dislocation. There is mild symmetric narrowing of both hip joints. No erosive change. Sacroiliac joints appear normal. IMPRESSION: Slight symmetric narrowing both hip joints. No fracture or dislocation. Electronically Signed   By: Lowella Grip III M.D.   On: 10/28/2017 09:34    Procedures Procedures (including critical care time)  Medications Ordered in ED Medications  ketorolac (TORADOL) injection 60 mg (60 mg Intramuscular Given 10/28/17 0940)  HYDROmorphone (DILAUDID) injection 1 mg (1 mg Intramuscular Given 10/28/17 0940)     Initial Impression / Assessment and Plan / ED Course  I have reviewed the triage vital signs and the nursing notes.  Pertinent labs & imaging results that were available during my care of the patient were reviewed by me and considered in my medical decision making (see chart for details).     MDM Pt feels better after pain medications.  Pelvic film reviewed.  Pt advised of results.  Pt advised to continue using donut pillow.  She is advised to see her MD for recheck in 2-3 days.   Final Clinical Impressions(s) / ED Diagnoses   Final diagnoses:  Contusion of coccyx, initial encounter   Meds ordered this encounter   Medications  . ketorolac (TORADOL) injection 60 mg  . HYDROmorphone (DILAUDID) injection 1 mg  . HYDROcodone-acetaminophen (NORCO/VICODIN) 5-325 MG tablet    Sig: Take 2 tablets by mouth every 4 (four) hours as needed.    Dispense:  16 tablet    Refill:  0    Order Specific Question:   Supervising Provider    Answer:   MILLER, BRIAN [[id number]]  . diclofenac (VOLTAREN) 75 MG EC tablet    Sig: Take 1 tablet (75 mg total) by mouth 2 (two) times daily.    Dispense:  20 tablet    Refill:  0    Order Specific Question:   Supervising Provider    Answer:   Noemi Chapel [[id number]]   ED Discharge Orders    None     Current Meds  Medication Sig  . dapagliflozin propanediol (FARXIGA) 10 MG TABS tablet Take 10 mg by mouth daily.  . Exenatide ER (BYDUREON BCISE) 2 MG/0.85ML AUIJ Inject 1 Act into the skin once a week.  . Insulin Degludec (TRESIBA FLEXTOUCH) 200 UNIT/ML SOPN Inject 50 Units into the skin daily.  . nitroGLYCERIN (NITROSTAT) 0.4 MG SL tablet Place 1 tablet (0.4 mg total) under the tongue every 5 (five) minutes as needed for chest pain (Hold for SBP <100).  . Omega-3 Fatty Acids (FISH OIL PO) Take 1 tablet by mouth daily.  Marland Kitchen oxyCODONE (ROXICODONE) 5 MG immediate release tablet Take 1-2 tablets (5-10 mg total) by mouth every 6 (six) hours as needed for breakthrough pain.  . Turmeric 500 MG TABS Take 1 tablet by mouth daily.  . [DISCONTINUED] HYDROcodone-acetaminophen (NORCO/VICODIN) 5-325 MG tablet Take 1 tablet by mouth every 6 (six) hours as needed for moderate pain.  . [DISCONTINUED] ibuprofen (ADVIL,MOTRIN) 400 MG tablet Take 1 tablet (400 mg total) by mouth 4 (four) times daily for 5 days.   DEA site reviewed.  An After Visit Summary was printed and given to the patient.   Fransico Meadow, [place] 10/28/17 1319    Pattricia Boss, MD 10/29/17 [phone]

## 2017-10-28 NOTE — ED Notes (Signed)
Patient transported to X-ray 

## 2017-10-28 NOTE — ED Triage Notes (Signed)
Pt was roller skating on Sunday and fell on tail bone and pain is getting worse.  Pt was seen at [facility] on Monday and pain medications not helping

## 2017-10-28 NOTE — ED Notes (Signed)
ED Provider at bedside. 

## 2017-10-28 NOTE — ED Notes (Signed)
Pt verbalized understanding of discharge instructions and denies any further questions at this time.   

## 2017-10-28 NOTE — Discharge Instructions (Signed)
See Dr. Ronnald Ramp for recheck in 2 days

## 2017-11-05 ENCOUNTER — Telehealth: Payer: Self-pay | Admitting: Internal Medicine

## 2017-11-05 NOTE — Telephone Encounter (Signed)
Pt seen in ED on 2/21 and was given a prescription for Hydrocodone due to pain in the tailbone. Pt requesting a refill of this medication not previously prescribed by Dr. Ronnald Ramp.   LOV: 08/02/17  PCP: Dr. Ronnald Ramp  Pharmacy: [facility] in [place]

## 2017-11-05 NOTE — Telephone Encounter (Signed)
Copied from [place] [id number]. Topic: Quick Communication - See Telephone Encounter >> Nov 05, 2017  1:59 PM Vernona Rieger wrote: CRM for notification. See Telephone encounter for:   11/05/17.  HYDROcodone-acetaminophen (NORCO/VICODIN) 5-325 MG tablet  [place], [address] [place] [id number]

## 2017-11-05 NOTE — Telephone Encounter (Signed)
I have spoken with patient.  Patient states Dr. Ronnald Ramp was prescribing this medication as well.  Patient states she only has two pills left.

## 2017-11-08 ENCOUNTER — Other Ambulatory Visit: Payer: Self-pay | Admitting: Internal Medicine

## 2017-11-08 DIAGNOSIS — M5136 Other intervertebral disc degeneration, lumbar region: Secondary | ICD-10-CM

## 2017-11-08 DIAGNOSIS — M170 Bilateral primary osteoarthritis of knee: Secondary | ICD-10-CM

## 2017-11-08 MED ORDER — HYDROCODONE-ACETAMINOPHEN 5-325 MG PO TABS: 2.0000 | ORAL_TABLET | ORAL | 0 refills | Status: DC | PRN

## 2017-11-08 NOTE — Telephone Encounter (Signed)
Request for hydrocodone.  

## 2017-11-29 NOTE — Telephone Encounter (Signed)
Pt. Called again about pain medicine.  She has not heard anything back .  Please call her

## 2017-11-29 NOTE — Telephone Encounter (Signed)
Does patient need to set up an appointment.  °

## 2017-11-29 NOTE — Telephone Encounter (Signed)
Yes, she will need an appt before we can refill.

## 2017-11-30 NOTE — Telephone Encounter (Signed)
Called and left VM to inform patient she needs to set up an appointment to be able to get a refill.   When patient calls back please set up this appointment. Thank you.

## 2018-02-02 ENCOUNTER — Encounter: Payer: Self-pay | Admitting: Family

## 2018-02-02 ENCOUNTER — Other Ambulatory Visit (INDEPENDENT_AMBULATORY_CARE_PROVIDER_SITE_OTHER): Payer: PRIVATE HEALTH INSURANCE

## 2018-02-02 ENCOUNTER — Ambulatory Visit: Payer: PRIVATE HEALTH INSURANCE | Admitting: Family

## 2018-02-02 VITALS — BP 122/78 | HR 96 | Temp 98.0°F | Ht 64.0 in | Wt 222.0 lb

## 2018-02-02 DIAGNOSIS — E049 Nontoxic goiter, unspecified: Secondary | ICD-10-CM | POA: Diagnosis not present

## 2018-02-02 LAB — COMPREHENSIVE METABOLIC PANEL
ALT: 13 U/L (ref 0–35)
AST: 9 U/L (ref 0–37)
Albumin: 3.6 g/dL (ref 3.5–5.2)
Alkaline Phosphatase: 91 U/L (ref 39–117)
BUN: 6 mg/dL (ref 6–23)
CALCIUM: 8.7 mg/dL (ref 8.4–10.5)
CHLORIDE: 103 meq/L (ref 96–112)
CO2: 28 meq/L (ref 19–32)
Creatinine, Ser: 0.52 mg/dL (ref 0.40–1.20)
GFR: 166.52 mL/min (ref >60.00)
GLUCOSE: 365 mg/dL — AB (ref 70–99)
POTASSIUM: 3.6 meq/L (ref 3.5–5.1)
Sodium: 139 mEq/L (ref 135–145)
Total Bilirubin: 0.3 mg/dL (ref 0.2–1.2)
Total Protein: 6.9 g/dL (ref 6.0–8.3)

## 2018-02-02 LAB — CBC WITH DIFFERENTIAL/PLATELET
BASOS PCT: 1 % (ref 0.0–3.0)
Basophils Absolute: 0.1 10*3/uL (ref 0.0–0.1)
EOS PCT: 1.3 % (ref 0.0–5.0)
Eosinophils Absolute: 0.1 10*3/uL (ref 0.0–0.7)
HEMATOCRIT: 38 % (ref 36.0–46.0)
Hemoglobin: 13.2 g/dL (ref 12.0–15.0)
LYMPHS ABS: 2.6 10*3/uL (ref 0.7–4.0)
LYMPHS PCT: 31 % (ref 12.0–46.0)
MCHC: 34.7 g/dL (ref 30.0–36.0)
MCV: 81.5 fl (ref 78.0–100.0)
MONOS PCT: 6.3 % (ref 3.0–12.0)
Monocytes Absolute: 0.5 10*3/uL (ref 0.1–1.0)
NEUTROS ABS: 5 10*3/uL (ref 1.4–7.7)
Neutrophils Relative %: 60.4 % (ref 43.0–77.0)
PLATELETS: 266 10*3/uL (ref 150.0–400.0)
RBC: 4.67 Mil/uL (ref 3.87–5.11)
RDW: 14.5 % (ref 11.5–15.5)
WBC: 8.3 10*3/uL (ref 4.0–10.5)

## 2018-02-02 LAB — THYROID PROFILE - CHCC
FREE THYROXINE INDEX: 2.8 (ref 1.4–3.8)
T3 Uptake: 32 % (ref 22–35)
T4 TOTAL: 8.9 ug/dL (ref 5.1–11.9)

## 2018-02-02 LAB — TSH: TSH: 1.7 u[IU]/mL (ref 0.35–4.50)

## 2018-02-02 NOTE — Progress Notes (Signed)
Spoke with patient and info given. She states when she was here she wanted samples for her diabetes medication because she had been out of her meds. She states it was "high" today because she had eaten and didn't know she was getting labs. She also wants to know if we have samples for her to have until she gets her meds. Please advise or do you want  Dr. Ronnald Ramp to take over the meds?

## 2018-02-02 NOTE — Progress Notes (Signed)
Maureen Price is a 42 y.o. female with the following history as recorded in EpicCare:  Patient Active Problem List   Diagnosis Date Noted  . Microalbuminuria due to type 2 diabetes mellitus ([place]) 06/24/2017  . Visit for screening mammogram 06/23/2017  . Asthma 09/18/2016  . DDD (degenerative disc disease), lumbar 01/02/2015  . Atherosclerosis of native artery of extremity with intermittent claudication ([place]) 03/05/2014  . Tobacco use disorder 03/05/2014  . Coronary artery disease 11/06/2013  . Dyslipidemia, goal LDL below 70 10/26/2013  . Obesity   . STEMI (ST elevation myocardial infarction), PTCA/Stent DES- [facility] [place]- LAD, large burden thrombus in LAD and RCA  10/24/2013  . Diabetes mellitus type 2, controlled, with complications ([place]) 84/69/6295  . DJD (degenerative joint disease) of knee 07/07/2012    Current Outpatient Medications  Medication Sig Dispense Refill  . dapagliflozin propanediol (FARXIGA) 10 MG TABS tablet Take 10 mg by mouth daily. 30 tablet 5  . Exenatide ER (BYDUREON BCISE) 2 MG/0.85ML AUIJ Inject 1 Act into the skin once a week. 12 pen 1  . Insulin Degludec (TRESIBA FLEXTOUCH) 200 UNIT/ML SOPN Inject 50 Units into the skin daily. 9 mL 1  . nitroGLYCERIN (NITROSTAT) 0.4 MG SL tablet Place 1 tablet (0.4 mg total) under the tongue every 5 (five) minutes as needed for chest pain (Hold for SBP <100). 30 tablet 0  . Omega-3 Fatty Acids (FISH OIL PO) Take 1 tablet by mouth daily.    . Turmeric 500 MG TABS Take 1 tablet by mouth daily.    . diclofenac (VOLTAREN) 75 MG EC tablet Take 1 tablet (75 mg total) by mouth 2 (two) times daily. (Patient not taking: Reported on 02/02/2018) 20 tablet 0   No current facility-administered medications for this visit.     Allergies: Erythromycin; Penicillins; Metformin and related; and Viibryd [vilazodone hcl]  Past Medical History:  Diagnosis Date  . Acute myocardial infarction of other anterior wall, initial episode of care    . Anginal pain ([place])   . Anxiety   . Arthritis   . Asthma   . CAD (coronary artery disease)   . CHF (congestive heart failure) ([place])   . Depression   . Diabetes mellitus    2002; no meds since 2004  . Dyslipidemia, goal LDL below 70 10/26/2013  . Dysrhythmia   . GERD (gastroesophageal reflux disease)   . Headache(784.0)   . Obesity   . Paroxysmal ventricular tachycardia ([place])   . Shortness of breath   . STEMI (ST elevation myocardial infarction) ([place])   . Tobacco abuse     Past Surgical History:  Procedure Laterality Date  . CESAREAN SECTION    . CESAREAN SECTION  1994,1997,2000  . CHOLECYSTECTOMY    . CORONARY ANGIOPLASTY WITH STENT PLACEMENT    . GALLBLADDER SURGERY    . LEFT HEART CATH N/A 10/24/2013   Procedure: LEFT HEART CATH;  Surgeon: Lorretta Harp, MD;  Location: [facility] CATH LAB;  Service: Cardiovascular;  Laterality: N/A;  . LEFT HEART CATHETERIZATION WITH CORONARY ANGIOGRAM N/A 10/26/2013   Procedure: LEFT HEART CATHETERIZATION WITH CORONARY ANGIOGRAM;  Surgeon: Burnell Blanks, MD;  Location[facility] CATH LAB;  Service: Cardiovascular;  Laterality: N/A;  . LEFT HEART CATHETERIZATION WITH CORONARY ANGIOGRAM N/A 03/29/2014   Procedure: LEFT HEART CATHETERIZATION WITH CORONARY ANGIOGRAM;  Surgeon: Lorretta Harp, MD;  Location: [facility] CATH LAB;  Service: Cardiovascular;  Laterality: N/A;  . TUBAL LIGATION     2000    Family  History  Problem Relation Age of Onset  . Arthritis Mother   . Diabetes Mother   . Heart disease Father   . Stroke Father   . Diabetes Maternal Grandfather   . Cancer Neg Hx   . Alcohol abuse Neg Hx   . Early death Neg Hx   . Kidney disease Neg Hx   . Hypertension Neg Hx   . Hyperlipidemia Neg Hx     Social History   Tobacco Use  . Smoking status: Current Every Day Smoker    Packs/day: 0.50    Years: 20.00    Pack years: 10.00    Types: Cigarettes  . Smokeless tobacco: Never Used  . Tobacco comment: 3 cigs/day  Substance Use Topics   . Alcohol use: No    Alcohol/week: 0.0 oz    Comment: occasional    Subjective:  Patient presents with concerns for increased difficulty swalloingwithin the past week; does not feel that food is actually getting stuck- "just feels like something is there." Noticed yesterday that there was a "lump" in the lower part of her neck. Thinks in the past she has been told that her thyroid was enlarged; last TSH done in 2016; Denies any chest pain or shortness of breath; denies any fever; No unexplained weight gain; admits that stress level is quite high recently- working 16 hours/ day/ going through a divorce; Does have history of acid reflux but not on medication and denies any increased burping or belching.      Objective:  Vitals:   02/02/18 0940  BP: 122/78  Pulse: 96  Temp: 98 F (36.7 C)  TempSrc: Oral  SpO2: 98%  Weight: 222 lb (100.7 kg)  Height: 5' 4"  (1.626 m)    General: Well developed, well nourished, in no acute distress  Skin : Warm and dry.  Head: Normocephalic and atraumatic  Eyes: Sclera and conjunctiva clear; pupils round and reactive to light; extraocular movements intact  Ears: External normal; canals clear; tympanic membranes normal  Oropharynx: Pink, supple. No suspicious lesions  Neck: Supple with marked bilateral thyromegaly, no adenopathy  Lungs: Respirations unlabored; clear to auscultation bilaterally without wheeze, rales, rhonchi  Neurologic: Alert and oriented; speech intact; face symmetrical; moves all extremities well; CNII-XII intact without focal deficit   Assessment:  1. Thyroid enlargement     Plan:  Update labs and ultrasound; follow up to be determined- will most likely need to see endocrine/ surgeon.   No follow-ups on file.  Orders Placed This Encounter  Procedures  . US THYROID    Standing Status:   Future    Standing Expiration Date:   04/05/2019    Order Specific Question:   Reason for Exam (SYMPTOM  OR DIAGNOSIS REQUIRED)    Answer:    thyroid enlargement    Order Specific Question:   Preferred imaging location?    Answer:   [facility]  . CBC w/Diff    Standing Status:   Future    Standing Expiration Date:   02/02/2019  . Comp Met (CMET)    Standing Status:   Future    Standing Expiration Date:   02/02/2019  . TSH    Standing Status:   Future    Standing Expiration Date:   02/02/2019  . Thyroid Profile    Standing Status:   Future    Standing Expiration Date:   02/03/2019    Requested Prescriptions    No prescriptions requested or ordered in this encounter

## 2018-02-03 ENCOUNTER — Telehealth: Payer: Self-pay

## 2018-02-03 DIAGNOSIS — Z794 Long term (current) use of insulin: Principal | ICD-10-CM

## 2018-02-03 DIAGNOSIS — E118 Type 2 diabetes mellitus with unspecified complications: Secondary | ICD-10-CM

## 2018-02-03 MED ORDER — INSULIN DEGLUDEC 200 UNIT/ML ~~LOC~~ SOPN: 50.0000 [IU] | PEN_INJECTOR | Freq: Every day | SUBCUTANEOUS | 1 refills | Status: DC

## 2018-02-03 MED ORDER — EXENATIDE ER 2 MG/0.85ML ~~LOC~~ AUIJ: 1.0000 | AUTO-INJECTOR | SUBCUTANEOUS | 1 refills | Status: DC

## 2018-02-03 MED ORDER — DAPAGLIFLOZIN PROPANEDIOL 10 MG PO TABS: 10.0000 mg | ORAL_TABLET | Freq: Every day | ORAL | 5 refills | Status: DC

## 2018-02-03 NOTE — Telephone Encounter (Signed)
Pt contacted for DM follow OV. Pt states that she needs her DM medication. I have sent those in and have scheduled pt for follow up with PCP.

## 2018-02-16 ENCOUNTER — Other Ambulatory Visit: Payer: Self-pay

## 2018-02-17 ENCOUNTER — Other Ambulatory Visit: Payer: Self-pay

## 2018-02-21 ENCOUNTER — Ambulatory Visit
Admission: RE | Admit: 2018-02-21 | Discharge: 2018-02-21 | Disposition: A | Discharge status: Home / Self Care | Payer: PRIVATE HEALTH INSURANCE | Source: Ambulatory Visit | Attending: Family | Admitting: Family

## 2018-02-21 ENCOUNTER — Other Ambulatory Visit: Payer: Self-pay | Admitting: Family

## 2018-02-21 DIAGNOSIS — E049 Nontoxic goiter, unspecified: Secondary | ICD-10-CM

## 2018-02-21 DIAGNOSIS — E042 Nontoxic multinodular goiter: Secondary | ICD-10-CM

## 2018-02-22 ENCOUNTER — Telehealth: Payer: Self-pay

## 2018-02-22 NOTE — Telephone Encounter (Signed)
Pt. Given results and verbalizes understanding. 

## 2018-02-28 ENCOUNTER — Encounter: Payer: Self-pay | Admitting: Internal Medicine

## 2018-02-28 ENCOUNTER — Other Ambulatory Visit (INDEPENDENT_AMBULATORY_CARE_PROVIDER_SITE_OTHER): Payer: PRIVATE HEALTH INSURANCE

## 2018-02-28 ENCOUNTER — Ambulatory Visit (INDEPENDENT_AMBULATORY_CARE_PROVIDER_SITE_OTHER): Payer: PRIVATE HEALTH INSURANCE | Admitting: Internal Medicine

## 2018-02-28 VITALS — BP 122/72 | HR 91 | Temp 98.2°F | Ht 64.0 in | Wt 223.0 lb

## 2018-02-28 DIAGNOSIS — E1165 Type 2 diabetes mellitus with hyperglycemia: Secondary | ICD-10-CM

## 2018-02-28 DIAGNOSIS — IMO0002 Reserved for concepts with insufficient information to code with codable children: Secondary | ICD-10-CM

## 2018-02-28 DIAGNOSIS — M170 Bilateral primary osteoarthritis of knee: Secondary | ICD-10-CM

## 2018-02-28 DIAGNOSIS — E118 Type 2 diabetes mellitus with unspecified complications: Secondary | ICD-10-CM | POA: Diagnosis not present

## 2018-02-28 DIAGNOSIS — M5136 Other intervertebral disc degeneration, lumbar region: Secondary | ICD-10-CM | POA: Diagnosis not present

## 2018-02-28 DIAGNOSIS — I70209 Unspecified atherosclerosis of native arteries of extremities, unspecified extremity: Secondary | ICD-10-CM

## 2018-02-28 DIAGNOSIS — Z794 Long term (current) use of insulin: Secondary | ICD-10-CM

## 2018-02-28 DIAGNOSIS — I2510 Atherosclerotic heart disease of native coronary artery without angina pectoris: Secondary | ICD-10-CM

## 2018-02-28 DIAGNOSIS — Z1231 Encounter for screening mammogram for malignant neoplasm of breast: Secondary | ICD-10-CM

## 2018-02-28 DIAGNOSIS — E785 Hyperlipidemia, unspecified: Secondary | ICD-10-CM

## 2018-02-28 DIAGNOSIS — I70213 Atherosclerosis of native arteries of extremities with intermittent claudication, bilateral legs: Secondary | ICD-10-CM

## 2018-02-28 DIAGNOSIS — E010 Iodine-deficiency related diffuse (endemic) goiter: Secondary | ICD-10-CM | POA: Insufficient documentation

## 2018-02-28 DIAGNOSIS — E1151 Type 2 diabetes mellitus with diabetic peripheral angiopathy without gangrene: Secondary | ICD-10-CM

## 2018-02-28 DIAGNOSIS — Z79891 Long term (current) use of opiate analgesic: Secondary | ICD-10-CM | POA: Diagnosis not present

## 2018-02-28 LAB — LIPID PANEL
CHOL/HDL RATIO: 5
Cholesterol: 150 mg/dL (ref 0–200)
HDL: 28.1 mg/dL — AB (ref >39.00)
LDL Cholesterol: 106 mg/dL — ABNORMAL HIGH (ref 0–99)
NONHDL: 121.78
Triglycerides: 79 mg/dL (ref 0.0–149.0)
VLDL: 15.8 mg/dL (ref 0.0–40.0)

## 2018-02-28 LAB — BASIC METABOLIC PANEL
BUN: 6 mg/dL (ref 6–23)
CO2: 27 meq/L (ref 19–32)
Calcium: 8.6 mg/dL (ref 8.4–10.5)
Chloride: 105 mEq/L (ref 96–112)
Creatinine, Ser: 0.46 mg/dL (ref 0.40–1.20)
GFR: 191.77 mL/min (ref >60.00)
GLUCOSE: 243 mg/dL — AB (ref 70–99)
POTASSIUM: 3.7 meq/L (ref 3.5–5.1)
SODIUM: 140 meq/L (ref 135–145)

## 2018-02-28 LAB — POCT GLYCOSYLATED HEMOGLOBIN (HGB A1C): HEMOGLOBIN A1C: 10.5 % — AB (ref 4.0–5.6)

## 2018-02-28 LAB — POCT GLUCOSE (DEVICE FOR HOME USE): Glucose Fasting, POC: 279 mg/dL — AB (ref 70–99)

## 2018-02-28 LAB — HM DIABETES FOOT EXAM

## 2018-02-28 MED ORDER — HYDROCODONE-ACETAMINOPHEN 5-325 MG PO TABS: 1.0000 | ORAL_TABLET | Freq: Three times a day (TID) | ORAL | 0 refills | Status: DC | PRN

## 2018-02-28 MED ORDER — INSULIN DEGLUDEC 200 UNIT/ML ~~LOC~~ SOPN: 50.0000 [IU] | PEN_INJECTOR | Freq: Every day | SUBCUTANEOUS | 1 refills | Status: DC

## 2018-02-28 MED ORDER — NITROGLYCERIN 0.4 MG SL SUBL: 0.4000 mg | SUBLINGUAL_TABLET | SUBLINGUAL | 2 refills | Status: DC | PRN

## 2018-02-28 MED ORDER — DAPAGLIFLOZIN PROPANEDIOL 10 MG PO TABS: 10.0000 mg | ORAL_TABLET | Freq: Every day | ORAL | 1 refills | Status: DC

## 2018-02-28 MED ORDER — EXENATIDE ER 2 MG/0.85ML ~~LOC~~ AUIJ: 1.0000 | AUTO-INJECTOR | SUBCUTANEOUS | 1 refills | Status: DC

## 2018-02-28 MED ORDER — HYDROCODONE-ACETAMINOPHEN 5-325 MG PO TABS
1.0000 | ORAL_TABLET | Freq: Three times a day (TID) | ORAL | 0 refills | Status: AC | PRN
Start: 2018-02-28 — End: 2018-03-10

## 2018-02-28 MED ORDER — ASPIRIN EC 81 MG PO TBEC: 81.0000 mg | DELAYED_RELEASE_TABLET | Freq: Every day | ORAL | 1 refills | Status: DC

## 2018-02-28 MED ORDER — ROSUVASTATIN CALCIUM 40 MG PO TABS: 40.0000 mg | ORAL_TABLET | Freq: Every day | ORAL | 1 refills | Status: DC

## 2018-02-28 MED ORDER — NALOXONE HCL 4 MG/0.1ML NA LIQD: 1.0000 | Freq: Once | NASAL | 2 refills | Status: AC

## 2018-02-28 NOTE — Patient Instructions (Signed)

## 2018-02-28 NOTE — Progress Notes (Signed)
Subjective:  Patient ID: Maureen Price, female    DOB: 1976/05/29  Age: 42 y.o. MRN: [id number]  CC: Diabetes   HPI Maureen Price presents for f/up -  she left her husband about a month ago and she says that he threw away all of her medications so she has not taken anything for her medical conditions over the last month.  She has mild claudication in her lower extremities.  She denies any recent episodes of headache/blurred vision/chest pain/shortness of breath/palpitations/edema/fatigue.  She complains of chronic low back pain and large joint pain that interferes with her activities of daily living.  She cannot take chronic NSAIDs because of her history of CAD.  Outpatient Medications Prior to Visit  Medication Sig Dispense Refill  . Omega-3 Fatty Acids (FISH OIL PO) Take 1 tablet by mouth daily.    . Turmeric 500 MG TABS Take 1 tablet by mouth daily.    . dapagliflozin propanediol (FARXIGA) 10 MG TABS tablet Take 10 mg by mouth daily. 30 tablet 5  . diclofenac (VOLTAREN) 75 MG EC tablet Take 1 tablet (75 mg total) by mouth 2 (two) times daily. 20 tablet 0  . Exenatide ER (BYDUREON BCISE) 2 MG/0.85ML AUIJ Inject 1 Act into the skin once a week. 12 pen 1  . Insulin Degludec (TRESIBA FLEXTOUCH) 200 UNIT/ML SOPN Inject 50 Units into the skin daily. 9 mL 1  . nitroGLYCERIN (NITROSTAT) 0.4 MG SL tablet Place 1 tablet (0.4 mg total) under the tongue every 5 (five) minutes as needed for chest pain (Hold for SBP <100). 30 tablet 0   No facility-administered medications prior to visit.     ROS Review of Systems  Constitutional: Negative.  Negative for chills, diaphoresis, fatigue and fever.  HENT: Negative.  Negative for sinus pressure and trouble swallowing.   Eyes: Negative for visual disturbance.  Respiratory: Negative for cough, chest tightness, shortness of breath and wheezing.   Cardiovascular: Negative for chest pain, palpitations and leg swelling.  Gastrointestinal:  Negative for abdominal pain, constipation, diarrhea, nausea and vomiting.  Endocrine: Negative for polydipsia, polyphagia and polyuria.  Genitourinary: Negative.  Negative for difficulty urinating.  Musculoskeletal: Positive for arthralgias and back pain. Negative for myalgias and neck pain.  Skin: Negative for color change, pallor and rash.  Allergic/Immunologic: Negative.   Neurological: Negative.  Negative for dizziness, weakness, light-headedness and headaches.  Hematological: Negative for adenopathy. Does not bruise/bleed easily.  Psychiatric/Behavioral: Negative.     Objective:  BP 122/72 (BP Location: Left Arm, Patient Position: Sitting, Cuff Size: Large)   Pulse 91   Temp 98.2 F (36.8 C) (Oral)   Ht _0  (1.626 m)   Wt 223 lb (101.2 kg)   SpO2 97%   BMI 38.28 kg/m   BP Readings from Last 3 Encounters:  02/28/18 122/72  02/02/18 122/78  10/28/17 139/80    Wt Readings from Last 3 Encounters:  02/28/18 223 lb (101.2 kg)  02/02/18 222 lb (100.7 kg)  10/25/17 218 lb (98.9 kg)    Physical Exam  Constitutional: She is oriented to person, place, and time. No distress.  HENT:  Mouth/Throat: Oropharynx is clear and moist. No oropharyngeal exudate.  Eyes: Conjunctivae are normal. No scleral icterus.  Neck: Trachea normal and phonation normal. Neck supple. No JVD present. Thyromegaly present.  Cardiovascular: Normal rate, regular rhythm and normal heart sounds. Exam reveals no friction rub.  No murmur heard. Pulmonary/Chest: Effort normal and breath sounds normal. No respiratory distress. She has  no wheezes. She has no rhonchi. She has no rales.  Abdominal: Soft. Bowel sounds are normal. She exhibits no mass. There is no hepatosplenomegaly. There is no tenderness. No hernia.  Musculoskeletal: Normal range of motion. She exhibits no edema or tenderness.       Right knee: She exhibits deformity (DJD). She exhibits normal range of motion, no swelling, no effusion and no  erythema. No tenderness found.       Left knee: She exhibits deformity. She exhibits normal range of motion, no swelling, no effusion and no erythema. No tenderness found.  Lymphadenopathy:    She has no cervical adenopathy.  Neurological: She is alert and oriented to person, place, and time.  Skin: Skin is warm and dry. She is not diaphoretic. No pallor.  Vitals reviewed.   Lab Results  Component Value Date   WBC 8.3 02/02/2018   HGB 13.2 02/02/2018   HCT 38.0 02/02/2018   PLT 266.0 02/02/2018   GLUCOSE 243 (H) 02/28/2018   CHOL 150 02/28/2018   TRIG 79.0 02/28/2018   HDL 28.10 (L) 02/28/2018   LDLCALC 106 (H) 02/28/2018   ALT 13 02/02/2018   AST 9 02/02/2018   NA 140 02/28/2018   K 3.7 02/28/2018   CL 105 02/28/2018   CREATININE 0.46 02/28/2018   BUN 6 02/28/2018   CO2 27 02/28/2018   TSH 1.70 02/02/2018   INR 1.04 01/16/2015   HGBA1C 10.5 (A) 02/28/2018   MICROALBUR 4.0 (H) 06/23/2017    US Thyroid  Result Date: 02/21/2018 CLINICAL DATA:  Thyroid enlargement. EXAM: THYROID ULTRASOUND TECHNIQUE: Ultrasound examination of the thyroid gland and adjacent soft tissues was performed. COMPARISON:  None. FINDINGS: Parenchymal Echotexture: Mildly heterogenous Isthmus: 0.5 cm Right lobe: 5.5 x 2.8 x 2.4 cm Left lobe: 5.2 x 2.6 x 2.6 cm _________________________________________________________ Estimated total number of nodules >/= 1 cm: 1 Number of spongiform nodules >/=  2 cm not described below (TR1): 0 Number of mixed cystic and solid nodules >/= 1.5 cm not described below (TR2): 0 _________________________________________________________ Nodule # 1: Location: Right; Superior Maximum size: 1.4 cm; Other 2 dimensions: 1.3 x 0.7 cm Composition: solid/almost completely solid (2) Echogenicity: hypoechoic (2) Shape: not taller-than-wide (0) Margins: ill-defined (0) Echogenic foci: none (0) ACR TI-RADS total points: 4. ACR TI-RADS risk category: TR4 (4-6 points). ACR TI-RADS recommendations:  *Given size (>/= 1 - 1.4 cm) and appearance, a follow-up ultrasound in 1 year should be considered based on TI-RADS criteria. _________________________________________________________ Nodule # 2: Location: Right; Inferior Maximum size: 0.9 cm; Other 2 dimensions: 0.5 x 0.7 cm Composition: solid/almost completely solid (2) Echogenicity: hypoechoic (2) Shape: not taller-than-wide (0) Margins: smooth (0) Echogenic foci: none (0) ACR TI-RADS total points: 4. ACR TI-RADS risk category: TR4 (4-6 points). ACR TI-RADS recommendations: Given size (<0.9 cm) and appearance, this nodule does NOT meet TI-RADS criteria for biopsy or dedicated follow-up. _________________________________________________________ Additional subcentimeter right thyroid nodules. 0.8 cm hypoechoic nodule in the mid left thyroid lobe that does not meet criteria for biopsy or dedicated follow-up. IMPRESSION: Thyroid nodules. Largest nodule is located in the mid right thyroid lobe and measures up to 1.4 cm. This is a TR 4 nodule and meets criteria for 1 year follow-up. The above is in keeping with the ACR TI-RADS recommendations - J Am Coll Radiol 2017;14:587-595. Electronically Signed   By: Markus Daft M.D.   On: 02/21/2018 14:54    Assessment & Plan:   Ryker was seen today for diabetes.  Diagnoses and  all orders for this visit:  Controlled type 2 diabetes mellitus with complication, with long-term current use of insulin (HCC) -     POCT glycosylated hemoglobin (Hb A1C) -     POCT Glucose (Device for Home Use) -     Basic metabolic panel; Future -     Ambulatory referral to Ophthalmology  Atherosclerosis of native artery of both lower extremities with intermittent claudication ([place]) -     Lipid panel; Future -     rosuvastatin (CRESTOR) 40 MG tablet; Take 1 tablet (40 mg total) by mouth daily. -     aspirin EC 81 MG tablet; Take 1 tablet (81 mg total) by mouth daily. -     Ambulatory referral to Cardiology  Coronary artery disease  involving native coronary artery of native heart without angina pectoris -     Lipid panel; Future -     rosuvastatin (CRESTOR) 40 MG tablet; Take 1 tablet (40 mg total) by mouth daily. -     aspirin EC 81 MG tablet; Take 1 tablet (81 mg total) by mouth daily. -     Ambulatory referral to Cardiology -     nitroGLYCERIN (NITROSTAT) 0.4 MG SL tablet; Place 1 tablet (0.4 mg total) under the tongue every 5 (five) minutes as needed for chest pain (Hold for SBP <100).  Dyslipidemia, goal LDL below 70- She has not achieved her LDL goal.  Have asked her to start high-dose, intensity statin for CV risk reduction. -     Lipid panel; Future -     rosuvastatin (CRESTOR) 40 MG tablet; Take 1 tablet (40 mg total) by mouth daily.  Uncontrolled diabetes mellitus type 2 with atherosclerosis of arteries of extremities (HCC)-her A1c is up to 10.4%.  Her blood sugars are not adequately well controlled.  I gave her samples and refills of her meds.  She will not take metformin. -     rosuvastatin (CRESTOR) 40 MG tablet; Take 1 tablet (40 mg total) by mouth daily. -     Insulin Degludec (TRESIBA FLEXTOUCH) 200 UNIT/ML SOPN; Inject 50 Units into the skin daily. -     Exenatide ER (BYDUREON BCISE) 2 MG/0.85ML AUIJ; Inject 1 Act into the skin once a week. -     dapagliflozin propanediol (FARXIGA) 10 MG TABS tablet; Take 10 mg by mouth daily.  Primary osteoarthritis of both knees -     Discontinue: HYDROcodone-acetaminophen (NORCO/VICODIN) 5-325 MG tablet; Take 1 tablet by mouth every 8 (eight) hours as needed for up to 10 days. -     HYDROcodone-acetaminophen (NORCO/VICODIN) 5-325 MG tablet; Take 1 tablet by mouth every 8 (eight) hours as needed for up to 10 days.  DDD (degenerative disc disease), lumbar -     Discontinue: HYDROcodone-acetaminophen (NORCO/VICODIN) 5-325 MG tablet; Take 1 tablet by mouth every 8 (eight) hours as needed for up to 10 days. -     HYDROcodone-acetaminophen (NORCO/VICODIN) 5-325 MG tablet;  Take 1 tablet by mouth every 8 (eight) hours as needed for up to 10 days.  Long-term current use of opiate analgesic -     naloxone (NARCAN) nasal spray 4 mg/0.1 mL; Place 1 spray into the nose once for 1 dose.  Visit for screening mammogram -     MM DIGITAL SCREENING BILATERAL; Future  Thyromegaly-  This is being followed by endocrinology.   I have discontinued Petrita D. Bade's diclofenac. I am also having her start on rosuvastatin, aspirin EC, and naloxone. Additionally, I am having  her maintain her Omega-3 Fatty Acids (FISH OIL PO), Turmeric, HYDROcodone-acetaminophen, nitroGLYCERIN, Insulin Degludec, Exenatide ER, and dapagliflozin propanediol.  Meds ordered this encounter  Medications  . rosuvastatin (CRESTOR) 40 MG tablet    Sig: Take 1 tablet (40 mg total) by mouth daily.    Dispense:  90 tablet    Refill:  1  . aspirin EC 81 MG tablet    Sig: Take 1 tablet (81 mg total) by mouth daily.    Dispense:  90 tablet    Refill:  1  . DISCONTD: HYDROcodone-acetaminophen (NORCO/VICODIN) 5-325 MG tablet    Sig: Take 1 tablet by mouth every 8 (eight) hours as needed for up to 10 days.    Dispense:  75 tablet    Refill:  0  . HYDROcodone-acetaminophen (NORCO/VICODIN) 5-325 MG tablet    Sig: Take 1 tablet by mouth every 8 (eight) hours as needed for up to 10 days.    Dispense:  75 tablet    Refill:  0  . naloxone (NARCAN) nasal spray 4 mg/0.1 mL    Sig: Place 1 spray into the nose once for 1 dose.    Dispense:  2 kit    Refill:  2  . nitroGLYCERIN (NITROSTAT) 0.4 MG SL tablet    Sig: Place 1 tablet (0.4 mg total) under the tongue every 5 (five) minutes as needed for chest pain (Hold for SBP <100).    Dispense:  30 tablet    Refill:  2  . Insulin Degludec (TRESIBA FLEXTOUCH) 200 UNIT/ML SOPN    Sig: Inject 50 Units into the skin daily.    Dispense:  9 mL    Refill:  1  . Exenatide ER (BYDUREON BCISE) 2 MG/0.85ML AUIJ    Sig: Inject 1 Act into the skin once a week.     Dispense:  12 pen    Refill:  1  . dapagliflozin propanediol (FARXIGA) 10 MG TABS tablet    Sig: Take 10 mg by mouth daily.    Dispense:  90 tablet    Refill:  1     Follow-up: Return in about 4 months (around 06/30/2018).  [name], MD

## 2018-04-27 ENCOUNTER — Encounter: Payer: Self-pay | Admitting: Endocrinology

## 2018-04-27 ENCOUNTER — Ambulatory Visit (INDEPENDENT_AMBULATORY_CARE_PROVIDER_SITE_OTHER): Payer: PRIVATE HEALTH INSURANCE | Admitting: Endocrinology

## 2018-04-27 VITALS — BP 124/78 | HR 91 | Temp 98.2°F | Ht 64.0 in | Wt 222.2 lb

## 2018-04-27 DIAGNOSIS — E010 Iodine-deficiency related diffuse (endemic) goiter: Secondary | ICD-10-CM | POA: Diagnosis not present

## 2018-04-27 NOTE — Progress Notes (Signed)
Subjective:    Patient ID: Maureen Price, female    DOB: [birth year], 42 y.o.   MRN: [id number]  HPI Pt is referred by Dr Ronnald Ramp, for nodular thyroid.  Pt was noted to have a nodule at the thyroid in mid-2019.  She reported slight swelling at the ant neck, but no assoc pain.  she is unaware of ever having had thyroid problems in the past.  she has no h/o XRT or surgery to the neck.   Past Medical History:  Diagnosis Date  . Acute myocardial infarction of other anterior wall, initial episode of care   . Anginal pain ([place])   . Anxiety   . Arthritis   . Asthma   . CAD (coronary artery disease)   . CHF (congestive heart failure) ([place])   . Depression   . Diabetes mellitus    2002; no meds since 2004  . Dyslipidemia, goal LDL below 70 10/26/2013  . Dysrhythmia   . GERD (gastroesophageal reflux disease)   . Headache(784.0)   . Obesity   . Paroxysmal ventricular tachycardia ([place])   . Shortness of breath   . STEMI (ST elevation myocardial infarction) ([place])   . Tobacco abuse     Past Surgical History:  Procedure Laterality Date  . CESAREAN SECTION    . CESAREAN SECTION  1994,1997,2000  . CHOLECYSTECTOMY    . CORONARY ANGIOPLASTY WITH STENT PLACEMENT    . GALLBLADDER SURGERY    . LEFT HEART CATH N/A 10/24/2013   Procedure: LEFT HEART CATH;  Surgeon: Lorretta Harp, MD;  Location: [facility] CATH LAB;  Service: Cardiovascular;  Laterality: N/A;  . LEFT HEART CATHETERIZATION WITH CORONARY ANGIOGRAM N/A 10/26/2013   Procedure: LEFT HEART CATHETERIZATION WITH CORONARY ANGIOGRAM;  Surgeon: Burnell Blanks, MD;  Location: [facility] CATH LAB;  Service: Cardiovascular;  Laterality: N/A;  . LEFT HEART CATHETERIZATION WITH CORONARY ANGIOGRAM N/A 03/29/2014   Procedure: LEFT HEART CATHETERIZATION WITH CORONARY ANGIOGRAM;  Surgeon: Lorretta Harp, MD;  Location: [facility] CATH LAB;  Service: Cardiovascular;  Laterality: N/A;  . TUBAL LIGATION     2000    Social History   Socioeconomic History  .  Marital status: Married    Spouse name: Not on file  . Number of children: Not on file  . Years of education: Not on file  . Highest education level: Not on file  Occupational History  . Not on file  Social Needs  . Financial resource strain: Not on file  . Food insecurity:    Worry: Not on file    Inability: Not on file  . Transportation needs:    Medical: Not on file    Non-medical: Not on file  Tobacco Use  . Smoking status: Current Every Day Smoker    Packs/day: 0.50    Years: 20.00    Pack years: 10.00    Types: Cigarettes  . Smokeless tobacco: Never Used  . Tobacco comment: 3 cigs/day  Substance and Sexual Activity  . Alcohol use: No    Comment: occasional  . Drug use: No  . Sexual activity: Yes    Birth control/protection: Surgical  Lifestyle  . Physical activity:    Days per week: Not on file    Minutes per session: Not on file  . Stress: Not on file  Relationships  . Social connections:    Talks on phone: Not on file    Gets together: Not on file    Attends religious service: Not on file  Active member of club or organization: Not on file    Attends meetings of clubs or organizations: Not on file    Relationship status: Not on file  . Intimate partner violence:    Fear of current or ex partner: Not on file    Emotionally abused: Not on file    Physically abused: Not on file    Forced sexual activity: Not on file  Other Topics Concern  . Not on file  Social History Narrative   Caffienated drinks- yes   Seat belt use often- yes   Regular Exercise- yes   Smoke alarm in the home- yes   Firearms/guns in the home- no   History of physical abuse- no             Current Outpatient Medications on File Prior to Visit  Medication Sig Dispense Refill  . aspirin EC 81 MG tablet Take 1 tablet (81 mg total) by mouth daily. 90 tablet 1  . dapagliflozin propanediol (FARXIGA) 10 MG TABS tablet Take 10 mg by mouth daily. 90 tablet 1  . Exenatide ER (BYDUREON  BCISE) 2 MG/0.85ML AUIJ Inject 1 Act into the skin once a week. 12 pen 1  . Insulin Degludec (TRESIBA FLEXTOUCH) 200 UNIT/ML SOPN Inject 50 Units into the skin daily. 9 mL 1  . nitroGLYCERIN (NITROSTAT) 0.4 MG SL tablet Place 1 tablet (0.4 mg total) under the tongue every 5 (five) minutes as needed for chest pain (Hold for SBP <100). 30 tablet 2  . Omega-3 Fatty Acids (FISH OIL PO) Take 1 tablet by mouth daily.    . rosuvastatin (CRESTOR) 40 MG tablet Take 1 tablet (40 mg total) by mouth daily. 90 tablet 1  . Turmeric 500 MG TABS Take 1 tablet by mouth daily.     No current facility-administered medications on file prior to visit.     Allergies  Allergen Reactions  . Erythromycin Anaphylaxis  . Penicillins Anaphylaxis  . Metformin And Related Other (See Comments)    diarrhea  . Viibryd [Vilazodone Hcl] Other (See Comments)    Altered mental status.      Family History  Problem Relation Age of Onset  . Arthritis Mother   . Diabetes Mother   . Heart disease Father   . Stroke Father   . Diabetes Maternal Grandfather   . Cancer Neg Hx   . Alcohol abuse Neg Hx   . Early death Neg Hx   . Kidney disease Neg Hx   . Hypertension Neg Hx   . Hyperlipidemia Neg Hx   . Thyroid disease Neg Hx     BP 124/78 (BP Location: Right Arm, Patient Position: Sitting, Cuff Size: Normal)   Pulse 91   Temp 98.2 F (36.8 C) (Oral)   Ht 5\' 4"  (1.626 m)   Wt 222 lb 3.2 oz (100.8 kg)   LMP 04/21/2018   SpO2 97%   BMI 38.14 kg/m     Review of Systems Denies weight change, hoarseness, visual loss, chest pain, sob, cough, dysphagia, diarrhea, itching, flushing, easy bruising, depression, cold intolerance, headache, numbness, and rhinorrhea.       Objective:   Physical Exam VS: see vs page GEN: no distress HEAD: head: no deformity eyes: no periorbital swelling, no proptosis external nose and ears are normal mouth: no lesion seen NECK: thyroid is slightly and diffusely enlarged, but I cannot  palpate any nodule CHEST WALL: no deformity LUNGS: clear to auscultation CV: reg rate and rhythm, no murmur  ABD: abdomen is soft, nontender.  no hepatosplenomegaly.  not distended.  no hernia MUSCULOSKELETAL: muscle bulk and strength are grossly normal.  no obvious joint swelling.  gait is normal and steady EXTEMITIES: no deformity.  no ulcer on the feet.  feet are of normal color and temp.  no edema PULSES: dorsalis pedis intact bilat.  no carotid bruit NEURO:  cn 2-12 grossly intact.   readily moves all 4's.  sensation is intact to touch on the feet SKIN:  Normal texture and temperature.  No rash or suspicious lesion is visible.   NODES:  None palpable at the neck PSYCH: alert, well-oriented.  Does not appear anxious nor depressed.  I have reviewed outside records, and summarized: Pt was noted to have multinodular goiter, and referred here.  Other probs addressed were CAD and arthralgias.   Lab Results  Component Value Date   TSH 1.70 02/02/2018   T4TOTAL 8.9 02/02/2018   [place]: multiple thyroid nodules.  Largest nodule is located in the mid right thyroid lobe and measures up to 1.4 cm.     Assessment & Plan:  Multinodular goiter, new to me.  We discussed.    Patient Instructions  No biopsy is needed now. Please come back for a follow-up appointment in approx 9 months, when you will probably be due for another ultrasound.

## 2018-04-27 NOTE — Patient Instructions (Signed)
No biopsy is needed now. Please come back for a follow-up appointment in approx 9 months, when you will probably be due for another ultrasound.

## 2018-04-28 ENCOUNTER — Ambulatory Visit: Payer: Self-pay

## 2018-05-16 ENCOUNTER — Encounter: Payer: Self-pay | Admitting: Internal Medicine

## 2018-05-25 ENCOUNTER — Ambulatory Visit: Payer: Self-pay

## 2018-06-21 ENCOUNTER — Ambulatory Visit: Payer: Self-pay

## 2018-08-09 ENCOUNTER — Telehealth: Payer: Self-pay

## 2018-08-09 NOTE — Telephone Encounter (Signed)
We do not have current insurance information for patient. I will have to call patient or pharmacy for that info.

## 2018-08-10 NOTE — Telephone Encounter (Signed)
Called pharmacy and they do not have any rx coverage information for patient. They stated that the patient has always used a discount card.   Left detailed message for pt requesting rx coverage information.

## 2018-11-04 ENCOUNTER — Encounter (HOSPITAL_COMMUNITY): Payer: Self-pay

## 2018-11-04 ENCOUNTER — Other Ambulatory Visit: Payer: Self-pay

## 2018-11-04 ENCOUNTER — Emergency Department (HOSPITAL_BASED_OUTPATIENT_CLINIC_OR_DEPARTMENT_OTHER): Payer: PRIVATE HEALTH INSURANCE

## 2018-11-04 ENCOUNTER — Emergency Department (HOSPITAL_COMMUNITY)
Admission: EM | Admit: 2018-11-04 | Discharge: 2018-11-04 | Disposition: A | Discharge status: Home / Self Care | Payer: PRIVATE HEALTH INSURANCE | Attending: Emergency Medicine | Admitting: Emergency Medicine

## 2018-11-04 DIAGNOSIS — J45909 Unspecified asthma, uncomplicated: Secondary | ICD-10-CM | POA: Insufficient documentation

## 2018-11-04 DIAGNOSIS — I509 Heart failure, unspecified: Secondary | ICD-10-CM | POA: Diagnosis not present

## 2018-11-04 DIAGNOSIS — M79672 Pain in left foot: Secondary | ICD-10-CM | POA: Diagnosis not present

## 2018-11-04 DIAGNOSIS — I2510 Atherosclerotic heart disease of native coronary artery without angina pectoris: Secondary | ICD-10-CM | POA: Insufficient documentation

## 2018-11-04 DIAGNOSIS — M7989 Other specified soft tissue disorders: Secondary | ICD-10-CM

## 2018-11-04 DIAGNOSIS — R52 Pain, unspecified: Secondary | ICD-10-CM | POA: Diagnosis not present

## 2018-11-04 DIAGNOSIS — E1151 Type 2 diabetes mellitus with diabetic peripheral angiopathy without gangrene: Secondary | ICD-10-CM | POA: Insufficient documentation

## 2018-11-04 DIAGNOSIS — F17210 Nicotine dependence, cigarettes, uncomplicated: Secondary | ICD-10-CM | POA: Diagnosis not present

## 2018-11-04 DIAGNOSIS — R609 Edema, unspecified: Secondary | ICD-10-CM | POA: Diagnosis not present

## 2018-11-04 MED ORDER — KETOROLAC TROMETHAMINE 60 MG/2ML IM SOLN
30.0000 mg | Freq: Once | INTRAMUSCULAR | Status: AC
  Administered 2018-11-04: 30 mg via INTRAMUSCULAR
  Filled 2018-11-04: qty 2

## 2018-11-04 MED ORDER — OXYCODONE-ACETAMINOPHEN 5-325 MG PO TABS
1.0000 | ORAL_TABLET | Freq: Once | ORAL | Status: AC
  Administered 2018-11-04: 1 via ORAL
  Filled 2018-11-04: qty 1

## 2018-11-04 NOTE — ED Triage Notes (Signed)
Pt arrives POV for eval of L foot/arch pain that radiates up entire L leg. Pt reports L leg is swollen, reports onset 2 days PTA. States works 16 hours shifts daily standing most of the time. +DP pulses to L foot, WWP.

## 2018-11-04 NOTE — Progress Notes (Signed)
Left lower extremity venous duplex exam completed. Please see preliminary notes on CV PROC under chart review. Orvella Digiulio H Frances Ambrosino(RDMS RVT) 11/04/18' 7:24 PM

## 2018-11-04 NOTE — ED Provider Notes (Signed)
Emergency Department Provider Note   I have reviewed the triage vital signs and the nursing notes.   HISTORY  Chief Complaint Foot Pain and Leg Pain   HPI Maureen Price is a 43 y.o. female multiple medical problems as documented below the presents the emergency department today with 2 separate complaints.  Patient states that she has had a knot on the left foot and the bottom for approximately 2 weeks is progressively worsening causing more pain.  Over the last 24 hours also notes that she has had left lower extremity swelling does not improve with laying flat.  She works a job on her feet for 16 hours a day but no recent change in her shoes.  No chest pain, shortness of breath.  She states she tried to use some heat for the leg and foot which did seem to help.  Travels, surgeries or history of blood clots. No other associated or modifying symptoms.    Past Medical History:  Diagnosis Date  . Acute myocardial infarction of other anterior wall, initial episode of care   . Anginal pain ([place])   . Anxiety   . Arthritis   . Asthma   . CAD (coronary artery disease)   . CHF (congestive heart failure) ([place])   . Depression   . Diabetes mellitus    2002; no meds since 2004  . Dyslipidemia, goal LDL below 70 10/26/2013  . Dysrhythmia   . GERD (gastroesophageal reflux disease)   . Headache(784.0)   . Obesity   . Paroxysmal ventricular tachycardia ([place])   . Shortness of breath   . STEMI (ST elevation myocardial infarction) ([place])   . Tobacco abuse     Patient Active Problem List   Diagnosis Date Noted  . Thyromegaly 02/28/2018  . Long-term current use of opiate analgesic 02/28/2018  . Microalbuminuria due to type 2 diabetes mellitus ([place]) 06/24/2017  . Visit for screening mammogram 06/23/2017  . Asthma 09/18/2016  . DDD (degenerative disc disease), lumbar 01/02/2015  . Atherosclerosis of native artery of extremity with intermittent claudication ([place]) 03/05/2014  . Tobacco  use disorder 03/05/2014  . Coronary artery disease 11/06/2013  . Dyslipidemia, goal LDL below 70 10/26/2013  . Obesity   . STEMI (ST elevation myocardial infarction), PTCA/Stent DES- [place]- LAD, large burden thrombus in LAD and RCA  10/24/2013  . Uncontrolled diabetes mellitus type 2 with atherosclerosis of arteries of extremities ([place]) 07/07/2012  . DJD (degenerative joint disease) of knee 07/07/2012    Past Surgical History:  Procedure Laterality Date  . CESAREAN SECTION    . CESAREAN SECTION  1994,1997,2000  . CHOLECYSTECTOMY    . CORONARY ANGIOPLASTY WITH STENT PLACEMENT    . GALLBLADDER SURGERY    . LEFT HEART CATH N/A 10/24/2013   Procedure: LEFT HEART CATH;  Surgeon: Lorretta Harp, MD;  Location: [facility] C[facility] LAB;  Service: Cardiovascular;  Laterality: N/A;  . LEFT HEART CATHETERIZATION WITH CORONARY ANGIOGRAM N/A 10/26/2013   Procedure: LEFT HEART CATHETERIZATION WITH CORONARY ANGIOGRAM;  Surgeon: Burnell Blanks, MD;  Location: [facility] CATH LAB;  Service: Cardiovascular;  Laterality: N/A;  . LEFT HEART CATHETERIZATION WITH CORONARY ANGIOGRAM N/A 03/29/2014   Procedure: LEFT HEART CATHETERIZATION WITH CORONARY ANGIOGRAM;  Surgeon: Lorretta Harp, MD;  Location: [facility] CATH LAB;  Service: Cardiovascular;  Laterality: N/A;  . TUBAL LIGATION     2000    Current Outpatient Rx  . Order #: [id number] Class: Normal  . Order #: [id number] Class: Historical Med  .  Order #: [id number] Class: Historical Med  . Order #: [id number] Class: Normal  . Order #: [id number] Class: Normal  . Order #: [id number] Class: Historical Med  . Order #: [id number] Class: Normal  . Order #: [id number] Class: Historical Med  . Order #: [id number] Class: Normal  . Order #: [id number] Class: Historical Med  . Order #: [id number] Class: Historical Med  . Order #: [id number] Class: Historical Med  . Order #: [id number] Class: Historical Med  . Order #: [id number] Class: Historical Med    Allergies Erythromycin; Penicillins;  Metformin and related; Viibryd [vilazodone hcl]; and Bee venom  Family History  Problem Relation Age of Onset  . Arthritis Mother   . Diabetes Mother   . Heart disease Father   . Stroke Father   . Diabetes Maternal Grandfather   . Cancer Neg Hx   . Alcohol abuse Neg Hx   . Early death Neg Hx   . Kidney disease Neg Hx   . Hypertension Neg Hx   . Hyperlipidemia Neg Hx   . Thyroid disease Neg Hx     Social History Social History   Tobacco Use  . Smoking status: Current Every Day Smoker    Packs/day: 0.50    Years: 20.00    Pack years: 10.00    Types: Cigarettes  . Smokeless tobacco: Never Used  . Tobacco comment: 3 cigs/day  Substance Use Topics  . Alcohol use: No    Comment: occasional  . Drug use: No    Review of Systems  All other systems negative except as documented in the HPI. All pertinent positives and negatives as reviewed in the HPI. ____________________________________________   PHYSICAL EXAM:  VITAL SIGNS: ED Triage Vitals  Enc Vitals Group     BP 11/04/18 1450 (!) 149/86     Pulse Rate 11/04/18 1450 91     Resp 11/04/18 1450 18     Temp 11/04/18 1450 98.1 F (36.7 C)     Temp Source 11/04/18 1450 Oral     SpO2 11/04/18 1450 98 %     Weight 11/04/18 1457 220 lb (99.8 kg)     Height 11/04/18 1457 5\' 5"  (1.651 m)    Constitutional: Alert and oriented. Well appearing and in no acute distress. Eyes: Conjunctivae are normal. PERRL. EOMI. Head: Atraumatic. Nose: No congestion/rhinnorhea. Mouth/Throat: Mucous membranes are moist.  Oropharynx non-erythematous. Neck: No stridor.  No meningeal signs.   Cardiovascular: Normal rate, regular rhythm. Good peripheral circulation. Grossly normal heart sounds.   Respiratory: Normal respiratory effort.  No retractions. Lungs CTAB. Gastrointestinal: Soft and nontender. No distention.  Musculoskeletal: No lower extremity tenderness but has LE edema that is mild. No gross deformities of extremities. Soft area of  swelling and tenderness along mid plantar fascia. No erythema, warmth or fluctuance Neurologic:  Normal speech and language. No gross focal neurologic deficits are appreciated.  Skin:  Skin is warm, dry and intact. No rash noted.   ____________________________________________   LABS (all labs ordered are listed, but only abnormal results are displayed)  Labs Reviewed - No data to display ____________________________________________  RADIOLOGY  [place] Lower Extremity Venous (dvt) (only [facility])  Result Date: 11/04/2018  Lower Venous Study Indications: Pain, Swelling, and Edema.  Limitations: Body habitus and patient cannot tolerate the pain from compression. Comparison Study: LLEV study on 05/18/2012 Performing Technologist: Rudell Cobb  Examination Guidelines: A complete evaluation includes B-mode imaging, spectral Doppler, color Doppler, and power Doppler as needed of all accessible portions of each vessel. Bilateral testing is considered an  integral part of a complete examination. Limited examinations for reoccurring indications may be performed as noted.  Right Venous Findings: +---+---------------+---------+-----------+----------+-------+    CompressibilityPhasicitySpontaneityPropertiesSummary +---+---------------+---------+-----------+----------+-------+ CFVFull           Yes      Yes                          +---+---------------+---------+-----------+----------+-------+  Left Venous Findings: +---------+---------------+---------+-----------+----------+-------------------+          CompressibilityPhasicitySpontaneityPropertiesSummary             +---------+---------------+---------+-----------+----------+-------------------+ CFV      Full           Yes      Yes                                      +---------+---------------+---------+-----------+----------+-------------------+ SFJ      Full                                                              +---------+---------------+---------+-----------+----------+-------------------+ FV Prox  Full                                                             +---------+---------------+---------+-----------+----------+-------------------+ FV Mid   Full                                         very difficult to                                                         compress            +---------+---------------+---------+-----------+----------+-------------------+ FV DistalFull                                         very difficult to                                                         compress            +---------+---------------+---------+-----------+----------+-------------------+ PFV      Full                                                             +---------+---------------+---------+-----------+----------+-------------------+ POP      Full  Yes      Yes                  poor flow           +---------+---------------+---------+-----------+----------+-------------------+ PTV                                                   poor visibility                                                           with poor flow      +---------+---------------+---------+-----------+----------+-------------------+ PERO                                                  poor visibility                                                           with poor flow      +---------+---------------+---------+-----------+----------+-------------------+ GSV      Full                                                             +---------+---------------+---------+-----------+----------+-------------------+    Summary: Right: No evidence of common femoral vein obstruction. Left: There is no evidence of deep vein thrombosis in the lower extremity. However, portions of this examination were limited- see technologist comments above. No cystic structure  found in the popliteal fossa.  *See table(s) above for measurements and observations.    Preliminary     ____________________________________________   INITIAL IMPRESSION / ASSESSMENT AND PLAN / ED COURSE  Symptomatic treatment.  Will assess for DVT either tonight or in the morning.  Suspect that the foot problem is likely a cyst or neuroma on her plantar fascia will refer to podiatry.  DVT negative. Outpatient follow up and podiatry referral.  Pertinent labs & imaging results that were available during my care of the patient were reviewed by me and considered in my medical decision making (see chart for details).  ____________________________________________  FINAL CLINICAL IMPRESSION(S) / ED DIAGNOSES  Final diagnoses:  Foot pain, left     MEDICATIONS GIVEN DURING THIS VISIT:  Medications  ketorolac (TORADOL) injection 30 mg (30 mg Intramuscular Given 11/04/18 1949)  oxyCODONE-acetaminophen (PERCOCET/ROXICET) 5-325 MG per tablet 1 tablet (1 tablet Oral Given 11/04/18 1950)     NEW OUTPATIENT MEDICATIONS STARTED DURING THIS VISIT:  Discharge Medication List as of 11/04/2018  9:41 PM      Note:  This note was prepared with assistance of Dragon voice recognition software. Occasional wrong-word or sound-a-like substitutions may have occurred due to the inherent limitations of voice recognition software.  Apphia Cropley, Corene Cornea, MD 11/05/18 [phone]

## 2018-11-04 NOTE — ED Notes (Signed)
Painful lt foot for seVERAL DAYS no known injury

## 2019-01-05 ENCOUNTER — Ambulatory Visit (INDEPENDENT_AMBULATORY_CARE_PROVIDER_SITE_OTHER): Payer: BLUE CROSS/BLUE SHIELD | Admitting: Internal Medicine

## 2019-01-05 ENCOUNTER — Encounter: Payer: Self-pay | Admitting: Internal Medicine

## 2019-01-05 ENCOUNTER — Other Ambulatory Visit: Payer: Self-pay

## 2019-01-05 DIAGNOSIS — F410 Panic disorder [episodic paroxysmal anxiety] without agoraphobia: Secondary | ICD-10-CM | POA: Diagnosis not present

## 2019-01-05 DIAGNOSIS — E1151 Type 2 diabetes mellitus with diabetic peripheral angiopathy without gangrene: Secondary | ICD-10-CM | POA: Diagnosis not present

## 2019-01-05 DIAGNOSIS — I70209 Unspecified atherosclerosis of native arteries of extremities, unspecified extremity: Secondary | ICD-10-CM | POA: Diagnosis not present

## 2019-01-05 DIAGNOSIS — IMO0002 Reserved for concepts with insufficient information to code with codable children: Secondary | ICD-10-CM

## 2019-01-05 DIAGNOSIS — E1165 Type 2 diabetes mellitus with hyperglycemia: Secondary | ICD-10-CM | POA: Diagnosis not present

## 2019-01-05 MED ORDER — INSULIN DEGLUDEC 200 UNIT/ML ~~LOC~~ SOPN: 50.0000 [IU] | PEN_INJECTOR | Freq: Every day | SUBCUTANEOUS | 0 refills | Status: DC

## 2019-01-05 MED ORDER — ALPRAZOLAM 0.5 MG PO TABS: 0.5000 mg | ORAL_TABLET | Freq: Two times a day (BID) | ORAL | 1 refills | Status: DC | PRN

## 2019-01-05 MED ORDER — DAPAGLIFLOZIN PROPANEDIOL 10 MG PO TABS: 10.0000 mg | ORAL_TABLET | Freq: Every day | ORAL | 0 refills | Status: DC

## 2019-01-05 MED ORDER — EXENATIDE ER 2 MG/0.85ML ~~LOC~~ AUIJ: 1.0000 | AUTO-INJECTOR | SUBCUTANEOUS | 0 refills | Status: DC

## 2019-01-05 NOTE — Progress Notes (Signed)
Virtual Visit via Video Note  I connected with Maureen Price on 01/05/19 at  3:30 PM EDT by a video enabled telemedicine application and verified that I am speaking with the correct person using two identifiers.   I discussed the limitations of evaluation and management by telemedicine and the availability of in person appointments. The patient expressed understanding and agreed to proceed.  History of Present Illness: She checked in today for a virtual visit.  She was not willing to come in due to the COVID-19 pandemic.  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  She was told this yesterday and she will be tested tomorrow for COVID-19.  She denies cough, fever, chills, or shortness of breath.  She tells me that this has triggered panic attacks and she wants a refill of Xanax.  She denies crying spells, anhedonia, insomnia, SI, or HI.  She also tells me that she needs refills of her insulin and other diabetic medications.  She tells me she feels like her blood sugars have been well controlled.  She denies polys.    Observations/Objective: On the video feet she looked well.  Her mood and affect were normal.  There was no distress.  She was calm, cooperative, and appropriate.  Lab Results  Component Value Date   WBC 8.3 02/02/2018   HGB 13.2 02/02/2018   HCT 38.0 02/02/2018   PLT 266.0 02/02/2018   GLUCOSE 243 (H) 02/28/2018   CHOL 150 02/28/2018   TRIG 79.0 02/28/2018   HDL 28.10 (L) 02/28/2018   LDLCALC 106 (H) 02/28/2018   ALT 13 02/02/2018   AST 9 02/02/2018   NA 140 02/28/2018   K 3.7 02/28/2018   CL 105 02/28/2018   CREATININE 0.46 02/28/2018   BUN 6 02/28/2018   CO2 27 02/28/2018   TSH 1.70 02/02/2018   INR 1.04 01/16/2015   HGBA1C 10.5 (A) 02/28/2018   MICROALBUR 4.0 (H) 06/23/2017     Assessment and Plan: I have asked her to start doing some behavioral modifications to reduce her anxiety and panic.  I  recommended deep breathing, walking, and meditating.  She will also take Xanax as needed for panic.  She will let me know if she develops any new or worsening symptoms.  I refilled her diabetic medications.   Follow Up Instructions: She will let me know if she develops any new or worsening symptoms.  I informed her that she is due for a diabetes follow-up.  In the meantime, to help her control her blood sugar, I asked that she continue with the basal insulin, the GLP-1 agonist, and the SGLT2 inhibitor.    I discussed the assessment and treatment plan with the patient. The patient was provided an opportunity to ask questions and all were answered. The patient agreed with the plan and demonstrated an understanding of the instructions.   The patient was advised to call back or seek an in-person evaluation if the symptoms worsen or if the condition fails to improve as anticipated.  I provided 25 minutes of non-face-to-face time during this encounter.   [name], MD

## 2019-01-22 ENCOUNTER — Inpatient Hospital Stay (HOSPITAL_COMMUNITY)
Admission: EM | Admit: 2019-01-22 | Discharge: 2019-02-06 | DRG: 853 | Disposition: A | Discharge status: Home Health | Payer: BC Managed Care – PPO | Attending: Family Medicine | Admitting: Family Medicine

## 2019-01-22 ENCOUNTER — Emergency Department (HOSPITAL_COMMUNITY): Payer: BC Managed Care – PPO

## 2019-01-22 ENCOUNTER — Other Ambulatory Visit: Payer: Self-pay

## 2019-01-22 DIAGNOSIS — J9600 Acute respiratory failure, unspecified whether with hypoxia or hypercapnia: Secondary | ICD-10-CM

## 2019-01-22 DIAGNOSIS — K219 Gastro-esophageal reflux disease without esophagitis: Secondary | ICD-10-CM | POA: Diagnosis present

## 2019-01-22 DIAGNOSIS — E785 Hyperlipidemia, unspecified: Secondary | ICD-10-CM | POA: Diagnosis present

## 2019-01-22 DIAGNOSIS — J80 Acute respiratory distress syndrome: Secondary | ICD-10-CM | POA: Diagnosis not present

## 2019-01-22 DIAGNOSIS — J9601 Acute respiratory failure with hypoxia: Secondary | ICD-10-CM | POA: Diagnosis not present

## 2019-01-22 DIAGNOSIS — R0602 Shortness of breath: Secondary | ICD-10-CM

## 2019-01-22 DIAGNOSIS — I110 Hypertensive heart disease with heart failure: Secondary | ICD-10-CM | POA: Diagnosis present

## 2019-01-22 DIAGNOSIS — J9622 Acute and chronic respiratory failure with hypercapnia: Secondary | ICD-10-CM

## 2019-01-22 DIAGNOSIS — J1289 Other viral pneumonia: Secondary | ICD-10-CM | POA: Diagnosis not present

## 2019-01-22 DIAGNOSIS — A419 Sepsis, unspecified organism: Principal | ICD-10-CM

## 2019-01-22 DIAGNOSIS — D638 Anemia in other chronic diseases classified elsewhere: Secondary | ICD-10-CM | POA: Diagnosis present

## 2019-01-22 DIAGNOSIS — M5136 Other intervertebral disc degeneration, lumbar region: Secondary | ICD-10-CM | POA: Diagnosis present

## 2019-01-22 DIAGNOSIS — I70209 Unspecified atherosclerosis of native arteries of extremities, unspecified extremity: Secondary | ICD-10-CM

## 2019-01-22 DIAGNOSIS — F419 Anxiety disorder, unspecified: Secondary | ICD-10-CM | POA: Diagnosis present

## 2019-01-22 DIAGNOSIS — I2510 Atherosclerotic heart disease of native coronary artery without angina pectoris: Secondary | ICD-10-CM | POA: Diagnosis present

## 2019-01-22 DIAGNOSIS — E876 Hypokalemia: Secondary | ICD-10-CM

## 2019-01-22 DIAGNOSIS — R7881 Bacteremia: Secondary | ICD-10-CM | POA: Diagnosis not present

## 2019-01-22 DIAGNOSIS — Z8261 Family history of arthritis: Secondary | ICD-10-CM

## 2019-01-22 DIAGNOSIS — N7689 Other specified inflammation of vagina and vulva: Secondary | ICD-10-CM | POA: Diagnosis not present

## 2019-01-22 DIAGNOSIS — E871 Hypo-osmolality and hyponatremia: Secondary | ICD-10-CM | POA: Diagnosis not present

## 2019-01-22 DIAGNOSIS — Z91030 Bee allergy status: Secondary | ICD-10-CM

## 2019-01-22 DIAGNOSIS — R6520 Severe sepsis without septic shock: Secondary | ICD-10-CM | POA: Diagnosis not present

## 2019-01-22 DIAGNOSIS — Z4659 Encounter for fitting and adjustment of other gastrointestinal appliance and device: Secondary | ICD-10-CM

## 2019-01-22 DIAGNOSIS — E119 Type 2 diabetes mellitus without complications: Secondary | ICD-10-CM | POA: Diagnosis not present

## 2019-01-22 DIAGNOSIS — Z7982 Long term (current) use of aspirin: Secondary | ICD-10-CM | POA: Diagnosis not present

## 2019-01-22 DIAGNOSIS — J982 Interstitial emphysema: Secondary | ICD-10-CM | POA: Diagnosis present

## 2019-01-22 DIAGNOSIS — J9621 Acute and chronic respiratory failure with hypoxia: Secondary | ICD-10-CM | POA: Diagnosis not present

## 2019-01-22 DIAGNOSIS — Z880 Allergy status to penicillin: Secondary | ICD-10-CM

## 2019-01-22 DIAGNOSIS — Z955 Presence of coronary angioplasty implant and graft: Secondary | ICD-10-CM

## 2019-01-22 DIAGNOSIS — Z833 Family history of diabetes mellitus: Secondary | ICD-10-CM

## 2019-01-22 DIAGNOSIS — E872 Acidosis: Secondary | ICD-10-CM | POA: Diagnosis not present

## 2019-01-22 DIAGNOSIS — J45909 Unspecified asthma, uncomplicated: Secondary | ICD-10-CM | POA: Diagnosis present

## 2019-01-22 DIAGNOSIS — F17210 Nicotine dependence, cigarettes, uncomplicated: Secondary | ICD-10-CM | POA: Diagnosis present

## 2019-01-22 DIAGNOSIS — Z7902 Long term (current) use of antithrombotics/antiplatelets: Secondary | ICD-10-CM | POA: Diagnosis not present

## 2019-01-22 DIAGNOSIS — E669 Obesity, unspecified: Secondary | ICD-10-CM | POA: Diagnosis present

## 2019-01-22 DIAGNOSIS — E1151 Type 2 diabetes mellitus with diabetic peripheral angiopathy without gangrene: Secondary | ICD-10-CM

## 2019-01-22 DIAGNOSIS — L0292 Furuncle, unspecified: Secondary | ICD-10-CM | POA: Diagnosis present

## 2019-01-22 DIAGNOSIS — N493 Fournier gangrene: Secondary | ICD-10-CM

## 2019-01-22 DIAGNOSIS — Z8249 Family history of ischemic heart disease and other diseases of the circulatory system: Secondary | ICD-10-CM

## 2019-01-22 DIAGNOSIS — N7682 Fournier disease of vagina and vulva: Secondary | ICD-10-CM

## 2019-01-22 DIAGNOSIS — Z888 Allergy status to other drugs, medicaments and biological substances status: Secondary | ICD-10-CM

## 2019-01-22 DIAGNOSIS — Z6841 Body Mass Index (BMI) 40.0 and over, adult: Secondary | ICD-10-CM

## 2019-01-22 DIAGNOSIS — E870 Hyperosmolality and hypernatremia: Secondary | ICD-10-CM | POA: Diagnosis not present

## 2019-01-22 DIAGNOSIS — I252 Old myocardial infarction: Secondary | ICD-10-CM | POA: Diagnosis not present

## 2019-01-22 DIAGNOSIS — Z9114 Patient's other noncompliance with medication regimen: Secondary | ICD-10-CM

## 2019-01-22 DIAGNOSIS — E1165 Type 2 diabetes mellitus with hyperglycemia: Secondary | ICD-10-CM | POA: Diagnosis not present

## 2019-01-22 DIAGNOSIS — F17200 Nicotine dependence, unspecified, uncomplicated: Secondary | ICD-10-CM | POA: Diagnosis not present

## 2019-01-22 DIAGNOSIS — Z823 Family history of stroke: Secondary | ICD-10-CM

## 2019-01-22 DIAGNOSIS — M726 Necrotizing fasciitis: Secondary | ICD-10-CM | POA: Diagnosis not present

## 2019-01-22 DIAGNOSIS — Z794 Long term (current) use of insulin: Secondary | ICD-10-CM | POA: Diagnosis not present

## 2019-01-22 DIAGNOSIS — J1282 Pneumonia due to coronavirus disease 2019: Secondary | ICD-10-CM

## 2019-01-22 DIAGNOSIS — N179 Acute kidney failure, unspecified: Secondary | ICD-10-CM | POA: Diagnosis not present

## 2019-01-22 DIAGNOSIS — R109 Unspecified abdominal pain: Secondary | ICD-10-CM | POA: Diagnosis not present

## 2019-01-22 DIAGNOSIS — IMO0002 Reserved for concepts with insufficient information to code with codable children: Secondary | ICD-10-CM

## 2019-01-22 DIAGNOSIS — I509 Heart failure, unspecified: Secondary | ICD-10-CM | POA: Diagnosis present

## 2019-01-22 DIAGNOSIS — Z881 Allergy status to other antibiotic agents status: Secondary | ICD-10-CM

## 2019-01-22 DIAGNOSIS — U071 COVID-19: Secondary | ICD-10-CM

## 2019-01-22 DIAGNOSIS — R1900 Intra-abdominal and pelvic swelling, mass and lump, unspecified site: Secondary | ICD-10-CM | POA: Diagnosis not present

## 2019-01-22 LAB — CBC WITH DIFFERENTIAL/PLATELET
Abs Immature Granulocytes: 0.2 10*3/uL — ABNORMAL HIGH (ref 0.00–0.07)
Basophils Absolute: 0 10*3/uL (ref 0.0–0.1)
Basophils Relative: 0 %
Eosinophils Absolute: 0 10*3/uL (ref 0.0–0.5)
Eosinophils Relative: 0 %
HCT: 38.7 % (ref 36.0–46.0)
Hemoglobin: 13.3 g/dL (ref 12.0–15.0)
Immature Granulocytes: 1 %
Lymphocytes Relative: 8 %
Lymphs Abs: 1.9 10*3/uL (ref 0.7–4.0)
MCH: 27.5 pg (ref 26.0–34.0)
MCHC: 34.4 g/dL (ref 30.0–36.0)
MCV: 80.1 fL (ref 80.0–100.0)
Monocytes Absolute: 1.6 10*3/uL — ABNORMAL HIGH (ref 0.1–1.0)
Monocytes Relative: 7 %
Neutro Abs: 19.6 10*3/uL — ABNORMAL HIGH (ref 1.7–7.7)
Neutrophils Relative %: 84 %
Platelets: 251 10*3/uL (ref 150–400)
RBC: 4.83 MIL/uL (ref 3.87–5.11)
RDW: 12.9 % (ref 11.5–15.5)
WBC: 23.4 10*3/uL — ABNORMAL HIGH (ref 4.0–10.5)
nRBC: 0 % (ref 0.0–0.2)

## 2019-01-22 LAB — COMPREHENSIVE METABOLIC PANEL
ALT: 23 U/L (ref 0–44)
AST: 18 U/L (ref 15–41)
Albumin: 2.7 g/dL — ABNORMAL LOW (ref 3.5–5.0)
Alkaline Phosphatase: 134 U/L — ABNORMAL HIGH (ref 38–126)
Anion gap: 14 (ref 5–15)
BUN: 6 mg/dL (ref 6–20)
CO2: 20 mmol/L — ABNORMAL LOW (ref 22–32)
Calcium: 8.6 mg/dL — ABNORMAL LOW (ref 8.9–10.3)
Chloride: 93 mmol/L — ABNORMAL LOW (ref 98–111)
Creatinine, Ser: 0.7 mg/dL (ref 0.44–1.00)
GFR calc Af Amer: >60 mL/min (ref >60)
GFR calc non Af Amer: >60 mL/min (ref >60)
Glucose, Bld: 460 mg/dL — ABNORMAL HIGH (ref 70–99)
Potassium: 3.3 mmol/L — ABNORMAL LOW (ref 3.5–5.1)
Sodium: 127 mmol/L — ABNORMAL LOW (ref 135–145)
Total Bilirubin: 0.8 mg/dL (ref 0.3–1.2)
Total Protein: 6.9 g/dL (ref 6.5–8.1)

## 2019-01-22 LAB — I-STAT BETA HCG BLOOD, ED (MC, WL, AP ONLY): I-stat hCG, quantitative: <5 m[IU]/mL (ref <5)

## 2019-01-22 LAB — LACTIC ACID, PLASMA
Lactic Acid, Venous: 1.9 mmol/L (ref 0.5–1.9)
Lactic Acid, Venous: 1.2 mmol/L (ref 0.5–1.9)

## 2019-01-22 MED ORDER — IOHEXOL 300 MG/ML  SOLN
100.0000 mL | Freq: Once | INTRAMUSCULAR | Status: AC | PRN
  Administered 2019-01-22: 100 mL via INTRAVENOUS

## 2019-01-22 MED ORDER — MORPHINE SULFATE (PF) 4 MG/ML IV SOLN
4.0000 mg | Freq: Once | INTRAVENOUS | Status: AC
  Administered 2019-01-22: 4 mg via INTRAVENOUS
  Filled 2019-01-22: qty 1

## 2019-01-22 MED ORDER — ACETAMINOPHEN 325 MG PO TABS
650.0000 mg | ORAL_TABLET | Freq: Once | ORAL | Status: AC
  Administered 2019-01-22: 650 mg via ORAL
  Filled 2019-01-22: qty 2

## 2019-01-22 MED ORDER — VANCOMYCIN HCL IN DEXTROSE 1-5 GM/200ML-% IV SOLN
1000.0000 mg | Freq: Two times a day (BID) | INTRAVENOUS | Status: DC
  Administered 2019-01-23 – 2019-01-24 (×2): 1000 mg via INTRAVENOUS
  Filled 2019-01-22 (×3): qty 200

## 2019-01-22 MED ORDER — SODIUM CHLORIDE 0.9 % IV BOLUS
1000.0000 mL | Freq: Once | INTRAVENOUS | Status: AC
  Administered 2019-01-22: 1000 mL via INTRAVENOUS

## 2019-01-22 MED ORDER — SODIUM CHLORIDE 0.9 % IV SOLN
2.0000 g | Freq: Three times a day (TID) | INTRAVENOUS | Status: DC
  Administered 2019-01-23 – 2019-01-26 (×11): 2 g via INTRAVENOUS
  Filled 2019-01-22 (×15): qty 2

## 2019-01-22 MED ORDER — VANCOMYCIN HCL 10 G IV SOLR
1500.0000 mg | Freq: Once | INTRAVENOUS | Status: AC
  Administered 2019-01-23: 02:00:00 1500 mg via INTRAVENOUS
  Filled 2019-01-22: qty 1500

## 2019-01-22 MED ORDER — HYDROMORPHONE HCL 1 MG/ML IJ SOLN
1.0000 mg | Freq: Once | INTRAMUSCULAR | Status: AC
  Administered 2019-01-22: 1 mg via INTRAVENOUS
  Filled 2019-01-22: qty 1

## 2019-01-22 MED ORDER — CLINDAMYCIN PHOSPHATE 900 MG/50ML IV SOLN
900.0000 mg | Freq: Once | INTRAVENOUS | Status: AC
  Administered 2019-01-23: 900 mg via INTRAVENOUS
  Filled 2019-01-22 (×2): qty 50

## 2019-01-22 NOTE — ED Triage Notes (Signed)
Patient noticed a boil on her buttocks and vaginal lip on Thursday. She attempted to do an epsom salt bath to relieve pain but it did not help. She has lost her appetite. She has been having cold/hot spells.

## 2019-01-22 NOTE — ED Provider Notes (Signed)
Assumed care from PA Ward at shift change.  See prior notes for full H&P.  Briefly, 43 y.o. F here with concern for buttock abscess.  States has been present for a few days now.  Also reports decreased appetite and chills.  Patient was exquisitely tender to the touch on exam but no fluid collection noted by prior PA for bedside drainage.  Low grade fever here, tachycardic.  Labs obtained with leukocytosis and hyperglycemia.  Takes insulin for DM management but admits she has not taken this in several days (reportedly she ran out and doctor called in refill but has not picked it up yet).  Plan:  CT AP w/ contrast pending for further eval.  Will need admission.  Results for orders placed or performed during the hospital encounter of 01/22/19  CBC with Differential  Result Value Ref Range   WBC 23.4 (H) 4.0 - 10.5 K/uL   RBC 4.83 3.87 - 5.11 MIL/uL   Hemoglobin 13.3 12.0 - 15.0 g/dL   HCT 38.7 36.0 - 46.0 %   MCV 80.1 80.0 - 100.0 fL   MCH 27.5 26.0 - 34.0 pg   MCHC 34.4 30.0 - 36.0 g/dL   RDW 12.9 11.5 - 15.5 %   Platelets 251 150 - 400 K/uL   nRBC 0.0 0.0 - 0.2 %   Neutrophils Relative % 84 %   Neutro Abs 19.6 (H) 1.7 - 7.7 K/uL   Lymphocytes Relative 8 %   Lymphs Abs 1.9 0.7 - 4.0 K/uL   Monocytes Relative 7 %   Monocytes Absolute 1.6 (H) 0.1 - 1.0 K/uL   Eosinophils Relative 0 %   Eosinophils Absolute 0.0 0.0 - 0.5 K/uL   Basophils Relative 0 %   Basophils Absolute 0.0 0.0 - 0.1 K/uL   Immature Granulocytes 1 %   Abs Immature Granulocytes 0.20 (H) 0.00 - 0.07 K/uL  Comprehensive metabolic panel  Result Value Ref Range   Sodium 127 (L) 135 - 145 mmol/L   Potassium 3.3 (L) 3.5 - 5.1 mmol/L   Chloride 93 (L) 98 - 111 mmol/L   CO2 20 (L) 22 - 32 mmol/L   Glucose, Bld 460 (H) 70 - 99 mg/dL   BUN 6 6 - 20 mg/dL   Creatinine, Ser 0.70 0.44 - 1.00 mg/dL   Calcium 8.6 (L) 8.9 - 10.3 mg/dL   Total Protein 6.9 6.5 - 8.1 g/dL   Albumin 2.7 (L) 3.5 - 5.0 g/dL   AST 18 15 - 41 U/L   ALT  23 0 - 44 U/L   Alkaline Phosphatase 134 (H) 38 - 126 U/L   Total Bilirubin 0.8 0.3 - 1.2 mg/dL   GFR calc non Af Amer >60 >60 mL/min   GFR calc Af Amer >60 >60 mL/min   Anion gap 14 5 - 15  Lactic acid, plasma  Result Value Ref Range   Lactic Acid, Venous 1.9 0.5 - 1.9 mmol/L  I-Stat beta hCG blood, ED  Result Value Ref Range   I-stat hCG, quantitative <5.0 <5 mIU/mL   Comment 3           Ct Abdomen Pelvis W Contrast  Result Date: 01/22/2019 CLINICAL DATA:  Perirectal pain and swelling EXAM: CT ABDOMEN AND PELVIS WITH CONTRAST TECHNIQUE: Multidetector CT imaging of the abdomen and pelvis was performed using the standard protocol following bolus administration of intravenous contrast. CONTRAST:  162mL OMNIPAQUE IOHEXOL 300 MG/ML  SOLN COMPARISON:  None. FINDINGS: Lower chest: No acute abnormality. Hepatobiliary: No  focal liver abnormality is seen. Status post cholecystectomy. No biliary dilatation. Pancreas: Unremarkable. No pancreatic ductal dilatation or surrounding inflammatory changes. Spleen: Normal in size without focal abnormality. Adrenals/Urinary Tract: Adrenal glands are unremarkable. Kidneys are normal, without renal calculi, focal lesion, or hydronephrosis. Bladder is unremarkable. Stomach/Bowel: Stomach is within normal limits. Appendix appears normal. No evidence of bowel wall thickening, distention, or inflammatory changes. Vascular/Lymphatic: Calcific atherosclerosis. No enlarged abdominal or pelvic lymph nodes. Reproductive: No mass or other abnormality. Other: No abdominal wall hernia or abnormality. No abdominopelvic ascites. There is subcutaneous emphysema in the soft tissues about the right aspect of the anus, perineum, and posterior right vulva (series 12, image 5, series 14, image 73). No discrete fluid collections appreciated. Musculoskeletal: No acute or significant osseous findings. IMPRESSION: There is subcutaneous emphysema in the soft tissues about the right aspect of the  anus, perineum, and posterior right vulva (series 12, image 5, series 14, image 73). No discrete fluid collections appreciated. Findings are concerning for gas-forming infection (Fournier's gangrene). Recommend surgical consultation. Electronically Signed   By: Eddie Candle M.D.   On: 01/22/2019 23:14   CT is concerning for Fournier's gangrene.  On my review of CT, there does appear to be pockets of gas noted on right genital region.  General surgery paged.  Patient given IVF and additional pain meds.  Discussed with general surgery, Dr. Redmond Pulling-- recommends medical admission to assist with glucose control, he is on the way to ED and will evaluate.  Plan for surgical intervention.  Patient han been off her plavix for about a year.  Results discussed with patient, she is requesting to eat and asking when she can go home.  I have attempted to explain the severity of her diagnosis with her, however I think there is a lack of understanding.  I went over diagnosis again, explained she will surgical intervention as well as continued IV abx and likely hospitalization for several days.  Patient's husband was on facetime and able to hear conversation, he seemed to understand a little better and actually sounds like he tried to get her to be evaluated a few days ago.  Initially ordered vanc/zosyn but patient has PCN allergy, switch to aztreonam per pharmacy.    Discussed with Dr. Blaine Hamper-- he will admit for ongoing care.  COVID screen ordered.  CRITICAL CARE Performed by: Larene Pickett   Total critical care time: 45 minutes  Critical care time was exclusive of separately billable procedures and treating other patients.  Critical care was necessary to treat or prevent imminent or life-threatening deterioration.  Critical care was time spent personally by me on the following activities: development of treatment plan with patient and/or surrogate as well as nursing, discussions with consultants, evaluation of  patient's response to treatment, examination of patient, obtaining history from patient or surrogate, ordering and performing treatments and interventions, ordering and review of laboratory studies, ordering and review of radiographic studies, pulse oximetry and re-evaluation of patient's condition.    Larene Pickett, PA-C 01/23/19 0041    Varney Biles, MD 01/24/19 2008

## 2019-01-22 NOTE — ED Provider Notes (Signed)
[facility] EMERGENCY DEPARTMENT Provider Note   CSN: [id number] Arrival date & time: 01/22/19  1944    History   Chief Complaint Chief Complaint  Patient presents with  . Abscess    HPI Maureen Price is a 43 y.o. female.     The history is provided by the patient and medical records. No language interpreter was used.  Abscess  Associated symptoms: fever (?)    Maureen Price is a 43 y.o. female with multiple comorbidities as listed below including diabetes who presents to the Emergency Department complaining of area of pain and swelling to the right side of her buttocks since Thursday.  Feels as if both pain and swelling have been progressively worsening.  She has been doing Epson salt baths to try to help which has not provided much relief.  She is unsure about fever, but has been having cold chills then feeling really hot.  Denies any history of similar.  Reports that she takes insulin for her diabetes.  Past Medical History:  Diagnosis Date  . Acute myocardial infarction of other anterior wall, initial episode of care   . Anginal pain ([place])   . Anxiety   . Arthritis   . Asthma   . CAD (coronary artery disease)   . CHF (congestive heart failure) ([place])   . Depression   . Diabetes mellitus    2002; no meds since 2004  . Dyslipidemia, goal LDL below 70 10/26/2013  . Dysrhythmia   . GERD (gastroesophageal reflux disease)   . Headache(784.0)   . Obesity   . Paroxysmal ventricular tachycardia ([place])   . Shortness of breath   . STEMI (ST elevation myocardial infarction) ([place])   . Tobacco abuse     Patient Active Problem List   Diagnosis Date Noted  . Panic anxiety syndrome 01/05/2019  . Thyromegaly 02/28/2018  . Long-term current use of opiate analgesic 02/28/2018  . Microalbuminuria due to type 2 diabetes mellitus ([place]) 06/24/2017  . Visit for screening mammogram 06/23/2017  . Asthma 09/18/2016  . DDD (degenerative disc disease),  lumbar 01/02/2015  . Atherosclerosis of native artery of extremity with intermittent claudication ([place]) 03/05/2014  . Tobacco use disorder 03/05/2014  . Coronary artery disease 11/06/2013  . Dyslipidemia, goal LDL below 70 10/26/2013  . Obesity   . Uncontrolled diabetes mellitus type 2 with atherosclerosis of arteries of extremities ([place]) 07/07/2012  . DJD (degenerative joint disease) of knee 07/07/2012    Past Surgical History:  Procedure Laterality Date  . CESAREAN SECTION    . CESAREAN SECTION  1994,1997,2000  . CHOLECYSTECTOMY    . CORONARY ANGIOPLASTY WITH STENT PLACEMENT    . GALLBLADDER SURGERY    . LEFT HEART CATH N/A 10/24/2013   Procedure: LEFT HEART CATH;  Surgeon: Lorretta Harp, MD;  Location: [facility] CATH LAB;  Service: Cardiovascular;  Laterality: N/A;  . LEFT HEART CATHETERIZATION WITH CORONARY ANGIOGRAM N/A 10/26/2013   Procedure: LEFT HEART CATHETERIZATION WITH CORONARY ANGIOGRAM;  Surgeon: Burnell Blanks, MD;  Location[facility] CATH LAB;  Service: Cardiovascular;  Laterality: N/A;  . LEFT HEART CATHETERIZATION WITH CORONARY ANGIOGRAM N/A 03/29/2014   Procedure: LEFT HEART CATHETERIZATION WITH CORONARY ANGIOGRAM;  Surgeon: Lorretta Harp, MD;  Location: [facility] CATH LAB;  Service: Cardiovascular;  Laterality: N/A;  . TUBAL LIGATION     2000     OB History    Gravida  4   Para  3   Term  3  Preterm      AB  1   Living  3     SAB  1   TAB      Ectopic      Multiple      Live Births               Home Medications    Prior to Admission medications   Medication Sig Start Date End Date Taking? Authorizing Provider  ALPRAZolam Duanne Moron) 0.5 MG tablet Take 1 tablet (0.5 mg total) by mouth 2 (two) times daily as needed for anxiety. 01/05/19   [name], MD  aspirin EC 81 MG tablet Take 1 tablet (81 mg total) by mouth daily. 02/28/18   [name], MD  Calcium 150 MG TABS Take 1 tablet by mouth daily.    [provider]  clopidogrel  (PLAVIX) 75 MG tablet Take 75 mg by mouth daily.    [provider]  dapagliflozin propanediol (FARXIGA) 10 MG TABS tablet Take 10 mg by mouth daily. 01/05/19   [name], MD  Exenatide ER (BYDUREON BCISE) 2 MG/0.85ML AUIJ Inject 1 Act into the skin once a week. 01/05/19   [name], MD  Insulin Degludec (TRESIBA FLEXTOUCH) 200 UNIT/ML SOPN Inject 50 Units into the skin daily. 01/05/19   [name], MD  Multiple Vitamin (MULTI-VITAMIN DAILY PO) Take 1 tablet by mouth daily.    [provider]  nitroGLYCERIN (NITROSTAT) 0.4 MG SL tablet Place 1 tablet (0.4 mg total) under the tongue every 5 (five) minutes as needed for chest pain (Hold for SBP <100). 02/28/18   [name], MD  Omega-3 Fatty Acids (FISH OIL PO) Take 1 tablet by mouth daily.    [provider]  Potassium 75 MG TABS Take 1 tablet by mouth daily.    [provider]  Turmeric 500 MG TABS Take 1 tablet by mouth daily.    [provider]  vitamin B-12 (CYANOCOBALAMIN) 1000 MCG tablet Take 2,000 mcg by mouth daily.    [provider]  vitamin C (ASCORBIC ACID) 500 MG tablet Take 1,000 mg by mouth daily.    [provider]    Family History Family History  Problem Relation Age of Onset  . Arthritis Mother   . Diabetes Mother   . Heart disease Father   . Stroke Father   . Diabetes Maternal Grandfather   . Cancer Neg Hx   . Alcohol abuse Neg Hx   . Early death Neg Hx   . Kidney disease Neg Hx   . Hypertension Neg Hx   . Hyperlipidemia Neg Hx   . Thyroid disease Neg Hx     Social History Social History   Tobacco Use  . Smoking status: Current Every Day Smoker    Packs/day: 0.50    Years: 20.00    Pack years: 10.00    Types: Cigarettes  . Smokeless tobacco: Never Used  . Tobacco comment: 3 cigs/day  Substance Use Topics  . Alcohol use: No    Comment: occasional  . Drug use: No     Allergies   Erythromycin; Penicillins; Metformin and  related; Viibryd [vilazodone hcl]; and Bee venom   Review of Systems Review of Systems  Constitutional: Positive for chills and fever (?).  Skin: Positive for wound.  All other systems reviewed and are negative.    Physical Exam Updated Vital Signs BP 132/71 (BP Location: Left Arm)   Pulse Marland Kitchen)  106   Temp 99.6 F (37.6 C) (Oral)   Resp (!) 25   Ht 5' 5.5" (1.664 m)   Wt 97.5 kg   LMP 12/29/2018 Comment: s/p BTL  SpO2 97%   BMI 35.23 kg/m   Physical Exam Vitals signs and nursing note reviewed.  Constitutional:      General: She is not in acute distress.    Appearance: She is well-developed.  HENT:     Head: Normocephalic and atraumatic.  Neck:     Musculoskeletal: Neck supple.  Cardiovascular:     Heart sounds: Normal heart sounds. No murmur.     Comments: Tachycardic, but regular. Pulmonary:     Effort: Pulmonary effort is normal. No respiratory distress.     Breath sounds: Normal breath sounds.  Abdominal:     General: There is no distension.     Palpations: Abdomen is soft.     Tenderness: There is no abdominal tenderness.  Genitourinary:    Comments: Exquisitely tender to the right perirectal area and right gluteal cleft.  There is erythema and induration to this area as well. Skin:    General: Skin is warm and dry.  Neurological:     Mental Status: She is alert and oriented to person, place, and time.      ED Treatments / Results  Labs (all labs ordered are listed, but only abnormal results are displayed) Labs Reviewed  CBC WITH DIFFERENTIAL/PLATELET - Abnormal; Notable for the following components:      Result Value   WBC 23.4 (*)    Neutro Abs 19.6 (*)    Monocytes Absolute 1.6 (*)    Abs Immature Granulocytes 0.20 (*)    All other components within normal limits  COMPREHENSIVE METABOLIC PANEL - Abnormal; Notable for the following components:   Sodium 127 (*)    Potassium 3.3 (*)    Chloride 93 (*)    CO2 20 (*)    Glucose, Bld 460 (*)     Calcium 8.6 (*)    Albumin 2.7 (*)    Alkaline Phosphatase 134 (*)    All other components within normal limits  CULTURE, BLOOD (ROUTINE X 2)  CULTURE, BLOOD (ROUTINE X 2)  LACTIC ACID, PLASMA  LACTIC ACID, PLASMA  I-STAT BETA HCG BLOOD, ED ([facility], [facility], [facility] ONLY)    EKG None  Radiology No results found.  Procedures Procedures (including critical care time)  Medications Ordered in ED Medications  morphine 4 MG/ML injection 4 mg (4 mg Intravenous Given 01/22/19 2109)  acetaminophen (TYLENOL) tablet 650 mg (650 mg Oral Given 01/22/19 2106)  iohexol (OMNIPAQUE) 300 MG/ML solution 100 mL (100 mLs Intravenous Contrast Given 01/22/19 2236)     Initial Impression / Assessment and Plan / ED Course  I have reviewed the triage vital signs and the nursing notes.  Pertinent labs & imaging results that were available during my care of the patient were reviewed by me and considered in my medical decision making (see chart for details).       Maureen Price is a 43 y.o. female who presents to ED for pain and swelling to right gluteal cleft which has been progressively worsening.  She reports subjective fever and chills at home, but did not check temperature.  Did have low-grade temp of 100.1 in triage and tachycardic in the 110's.  Exquisitely tender to the right gluteal cleft and perirectal area, but no obvious abscess appreciated.  Will obtain labs and CT to further evaluate.  Labs reviewed showing leukocytosis of 23.4 -blood cultures added given sirs criteria.  Normal lactic  CMP shows hyperglycemia at 460 with normal anion gap.  Hyperglycemia likely contributory to gluteal infection.  CT pending at shift change.  Care assumed by oncoming provider, PA [facility], who will follow up on CT scan and dispo appropriately, anticipate admission.   Final Clinical Impressions(s) / ED Diagnoses   Final diagnoses:  None    ED Discharge Orders    None       Tynia Wiers, Ozella Almond, PA-C  01/22/19 2308    Varney Biles, MD 01/24/19 2007

## 2019-01-22 NOTE — H&P (Addendum)
History and Physical    Maureen Price HCW:[id number] DOB: 24-Nov-1975 DOA: 01/22/2019  Referring MD/NP/PA:   PCP: [name], MD   Patient coming from:  The patient is coming from home.  At baseline, pt is independent for most of ADL.        Chief Complaint: Pain in buttock and fever  HPI: Maureen Price is a 43 y.o. female with medical history significant of poorly controlled diabetes, medication noncompliance, asthma, GERD, depression, anxiety, CAD, stent placement, tobacco abuse, obesity, who presents with pain in buttock and fever.  Pt states that she initially noticed painful boils on her buttocks and vaginal lip on Thursday. She attempted to do an epsom salt bath to relieve pain without help. The pain has been progressively worsening, the area has been enlarging. The pain is constant, sharp, 10 out of 10 in severity, nonradiating. She states that the boil self drainage once without improvement of pain. She developed fever and chills today.  Patient states that she has a mild dry cough due to smoking, but no chest pain, shortness of breath.  She does not have nausea vomiting, diarrhea or abdominal pain.  She has some dysuria and burning on urination.  ED Course: pt was found to have WBC 23.4, lactic acid 1.9, 142, negative pregnancy test, pending urinalysis, pending COVID-19 test, he potassium 3.3, pseudohyponatremia (sodium 127, blood sugar 446), normal AG 1, renal function normal, temperature 100.1, tachycardia, tachypnea, oxygen saturation 97% on room air.  Patient is admitted to telemetry bed as inpatient.  # CT-abd/pelvis: There is subcutaneous emphysema in the soft tissues about the right aspect of the anus, perineum, and posterior right vulva (series 12, image 5, series 14, image 73). No discrete fluid collections appreciated. Findings are concerning for gas-forming infection (Fournier's gangrene).  Review of Systems:   General: has fevers, chills, no body weight  gain, has poor appetite, has fatigue HEENT: no blurry vision, hearing changes or sore throat Respiratory: no dyspnea, has coughing, no wheezing CV: no chest pain, no palpitations GI: no nausea, vomiting, abdominal pain, diarrhea, constipation GU: has dysuria, burning on urination, no increased urinary frequency, hematuria  Ext: no leg edema Neuro: no unilateral weakness, numbness, or tingling, no vision change or hearing loss Skin: no rash. Has tenderness in right perirectal area, right libia and right gluteal cleft.   MSK: No muscle spasm, no deformity, no limitation of range of movement in spin Heme: No easy bruising.  Travel history: No recent long distant travel.  Allergy:  Allergies  Allergen Reactions  . Erythromycin Anaphylaxis  . Penicillins Anaphylaxis  . Metformin And Related Other (See Comments)    Diarrhea, upset stomach  . Viibryd [Vilazodone Hcl] Other (See Comments)    Altered mental status.    . Bee Venom Swelling    Throat swells    Past Medical History:  Diagnosis Date  . Acute myocardial infarction of other anterior wall, initial episode of care   . Anginal pain ([place])   . Anxiety   . Arthritis   . Asthma   . CAD (coronary artery disease)   . CHF (congestive heart failure) ([place])   . Depression   . Diabetes mellitus    2002; no meds since 2004  . Dyslipidemia, goal LDL below 70 10/26/2013  . Dysrhythmia   . GERD (gastroesophageal reflux disease)   . Headache(784.0)   . Obesity   . Paroxysmal ventricular tachycardia ([place])   . Shortness of breath   .  STEMI (ST elevation myocardial infarction) ([place])   . Tobacco abuse     Past Surgical History:  Procedure Laterality Date  . CESAREAN SECTION    . CESAREAN SECTION  1994,1997,2000  . CHOLECYSTECTOMY    . CORONARY ANGIOPLASTY WITH STENT PLACEMENT    . GALLBLADDER SURGERY    . LEFT HEART CATH N/A 10/24/2013   Procedure: LEFT HEART CATH;  Surgeon: Lorretta Harp, MD;  Location: [facility] CATH LAB;  Service:  Cardiovascular;  Laterality: N/A;  . LEFT HEART CATHETERIZATION WITH CORONARY ANGIOGRAM N/A 10/26/2013   Procedure: LEFT HEART CATHETERIZATION WITH CORONARY ANGIOGRAM;  Surgeon: Burnell Blanks, MD;  Location: [facility] CATH LAB;  Service: Cardiovascular;  Laterality: N/A;  . LEFT HEART CATHETERIZATION WITH CORONARY ANGIOGRAM N/A 03/29/2014   Procedure: LEFT HEART CATHETERIZATION WITH CORONARY ANGIOGRAM;  Surgeon: Lorretta Harp, MD;  Location: [facility] C[facility] LAB;  Service: Cardiovascular;  Laterality: N/A;  . TUBAL LIGATION     2000    Social History:  reports that she has been smoking cigarettes. She has a 10.00 pack-year smoking history. She has never used smokeless tobacco. She reports that she does not drink alcohol or use drugs.  Family History:  Family History  Problem Relation Age of Onset  . Arthritis Mother   . Diabetes Mother   . Heart disease Father   . Stroke Father   . Diabetes Maternal Grandfather   . Cancer Neg Hx   . Alcohol abuse Neg Hx   . Early death Neg Hx   . Kidney disease Neg Hx   . Hypertension Neg Hx   . Hyperlipidemia Neg Hx   . Thyroid disease Neg Hx      Prior to Admission medications   Medication Sig Start Date End Date Taking? Authorizing Provider  ALPRAZolam Duanne Moron) 0.5 MG tablet Take 1 tablet (0.5 mg total) by mouth 2 (two) times daily as needed for anxiety. 01/05/19   [name], MD  aspirin EC 81 MG tablet Take 1 tablet (81 mg total) by mouth daily. 02/28/18   [name], MD  Calcium 150 MG TABS Take 1 tablet by mouth daily.    [provider]  clopidogrel (PLAVIX) 75 MG tablet Take 75 mg by mouth daily.    [provider]  dapagliflozin propanediol (FARXIGA) 10 MG TABS tablet Take 10 mg by mouth daily. 01/05/19   [name], MD  Exenatide ER (BYDUREON BCISE) 2 MG/0.85ML AUIJ Inject 1 Act into the skin once a week. 01/05/19   [name], MD  Insulin Degludec (TRESIBA FLEXTOUCH) 200 UNIT/ML SOPN Inject 50 Units into the  skin daily. 01/05/19   [name], MD  Multiple Vitamin (MULTI-VITAMIN DAILY PO) Take 1 tablet by mouth daily.    [provider]  nitroGLYCERIN (NITROSTAT) 0.4 MG SL tablet Place 1 tablet (0.4 mg total) under the tongue every 5 (five) minutes as needed for chest pain (Hold for SBP <100). 02/28/18   [name], MD  Omega-3 Fatty Acids (FISH OIL PO) Take 1 tablet by mouth daily.    [provider]  Potassium 75 MG TABS Take 1 tablet by mouth daily.    [provider]  Turmeric 500 MG TABS Take 1 tablet by mouth daily.    [provider]  vitamin B-12 (CYANOCOBALAMIN) 1000 MCG tablet Take 2,000 mcg by mouth daily.    [provider]  vitamin C (ASCORBIC ACID) 500 MG tablet Take 1,000 mg by mouth  daily.    [provider]    Physical Exam: Vitals:   01/22/19 2130 01/22/19 2219 01/22/19 2230 01/23/19 0115  BP: (!) 155/74 132/71 134/77 (!) 144/73  Pulse: (!) 109 (!) 106 (!) 106 (!) 110  Resp:  (!) 25 (!) 23 14  Temp:  99.6 F (37.6 C)    TempSrc:  Oral    SpO2: 97% 97% 97% 96%  Weight:      Height:       General: Not in acute distress HEENT:       Eyes: PERRL, EOMI, no scleral icterus.       ENT: No discharge from the ears and nose, no pharynx injection, no tonsillar enlargement.        Neck: No JVD, no bruit, no mass felt. Heme: No neck lymph node enlargement. Cardiac: S1/S2, RRR, No murmurs, No gallops or rubs. Respiratory:  No rales, wheezing, rhonchi or rubs. GI: Soft, nondistended, nontender, no rebound pain, no organomegaly, BS present. GU: No hematuria Ext: No pitting leg edema bilaterally. 2+DP/PT pulse bilaterally. Musculoskeletal: No joint deformities, No joint redness or warmth, no limitation of ROM in spin. Skin: No rashes. Has tenderness, warmth, erythema and swelling in right perirectal area, right libia and right gluteal cleft.  Neuro: Alert, oriented X3, cranial nerves II-XII grossly intact, moves all  extremities normally. Muscle strength 5/5 in all extremities, sensation to light touch intact. Brachial reflex 2+ bilaterally. Knee reflex 1+ bilaterally. Negative Babinski's sign. Normal finger to nose test. Psych: Patient is not psychotic, no suicidal or hemocidal ideation.  Labs on Admission: I have personally reviewed following labs and imaging studies  CBC: Recent Labs  Lab 01/22/19 2037  WBC 23.4*  NEUTROABS 19.6*  HGB 13.3  HCT 38.7  MCV 80.1  PLT 619   Basic Metabolic Panel: Recent Labs  Lab 01/22/19 2037  NA 127*  K 3.3*  CL 93*  CO2 20*  GLUCOSE 460*  BUN 6  CREATININE 0.70  CALCIUM 8.6*   GFR: Estimated Creatinine Clearance: 106.9 mL/min (by C-G formula based on SCr of 0.7 mg/dL). Liver Function Tests: Recent Labs  Lab 01/22/19 2037  AST 18  ALT 23  ALKPHOS 134*  BILITOT 0.8  PROT 6.9  ALBUMIN 2.7*   No results for input(s): LIPASE, AMYLASE in the last 168 hours. No results for input(s): AMMONIA in the last 168 hours. Coagulation Profile: Recent Labs  Lab 01/23/19 0019  INR 1.3*   Cardiac Enzymes: No results for input(s): CKTOTAL, CKMB, CKMBINDEX, TROPONINI in the last 168 hours. BNP (last 3 results) No results for input(s): PROBNP in the last 8760 hours. HbA1C: No results for input(s): HGBA1C in the last 72 hours. CBG: No results for input(s): GLUCAP in the last 168 hours. Lipid Profile: No results for input(s): CHOL, HDL, LDLCALC, TRIG, CHOLHDL, LDLDIRECT in the last 72 hours. Thyroid Function Tests: No results for input(s): TSH, T4TOTAL, FREET4, T3FREE, THYROIDAB in the last 72 hours. Anemia Panel: No results for input(s): VITAMINB12, FOLATE, FERRITIN, TIBC, IRON, RETICCTPCT in the last 72 hours. Urine analysis:    Component Value Date/Time   COLORURINE YELLOW 01/23/2019 0057   APPEARANCEUR HAZY (A) 01/23/2019 0057   LABSPEC 1.017 01/23/2019 0057   PHURINE 6.0 01/23/2019 0057   GLUCOSEU >=500 (A) 01/23/2019 0057   GLUCOSEU >=1000  (A) 05/20/2016 1347   HGBUR LARGE (A) 01/23/2019 0057   BILIRUBINUR NEGATIVE 01/23/2019 0057   BILIRUBINUR n 06/15/2016 1125   KETONESUR 20 (A) 01/23/2019 0057  PROTEINUR NEGATIVE 01/23/2019 0057   UROBILINOGEN negative 06/15/2016 1125   UROBILINOGEN 0.2 05/20/2016 1347   NITRITE NEGATIVE 01/23/2019 0057   LEUKOCYTESUR TRACE (A) 01/23/2019 0057   Sepsis Labs: _0 (procalcitonin:4,lacticidven:4) ) Recent Results (from the past 240 hour(s))  SARS Coronavirus 2 (CEPHEID - Performed in [place] hospital lab), Hosp Order     Status: Abnormal   Collection Time: 01/23/19  1:19 AM  Result Value Ref Range Status   SARS Coronavirus 2 POSITIVE (A) NEGATIVE Final    Comment: RESULT CALLED TO, READ BACK BY AND VERIFIED WITH: R FITZGERAKD ANSTI _1  01/23/19 BY S GEZAHEGN (NOTE) If result is NEGATIVE SARS-CoV-2 target nucleic acids are NOT DETECTED. The SARS-CoV-2 RNA is generally detectable in upper and lower  respiratory specimens during the acute phase of infection. The lowest  concentration of SARS-CoV-2 viral copies this assay can detect is 250  copies / mL. A negative result does not preclude SARS-CoV-2 infection  and should not be used as the sole basis for treatment or other  patient management decisions.  A negative result may occur with  improper specimen collection / handling, submission of specimen other  than nasopharyngeal swab, presence of viral mutation(s) within the  areas targeted by this assay, and inadequate number of viral copies  (<250 copies / mL). A negative result must be combined with clinical  observations, patient history, and epidemiological information. If result is POSITIVE SARS-CoV-2 target nucleic acids are D ETECTED. The SARS-CoV-2 RNA is generally detectable in upper and lower  respiratory specimens during the acute phase of infection.  Positive  results are indicative of active infection with SARS-CoV-2.  Clinical  correlation with patient  history and other diagnostic information is  necessary to determine patient infection status.  Positive results do  not rule out bacterial infection or co-infection with other viruses. If result is PRESUMPTIVE POSTIVE SARS-CoV-2 nucleic acids MAY BE PRESENT.   A presumptive positive result was obtained on the submitted specimen  and confirmed on repeat testing.  While 2019 novel coronavirus  (SARS-CoV-2) nucleic acids may be present in the submitted sample  additional confirmatory testing may be necessary for epidemiological  and / or clinical management purposes  to differentiate between  SARS-CoV-2 and other Sarbecovirus currently known to infect humans.  If clinically indicated additional testing with an alternate test  methodology [phone]) is advised. The SARS-CoV-2 RNA is generally  detectable in upper and lower respiratory specimens during the acute  phase of infection. The expected result is Negative. Fact Sheet for Patients:  StrictlyIdeas.no Fact Sheet for Healthcare Providers: BankingDealers.co.za This test is not yet approved or cleared by the [place] FDA and has been authorized for detection and/or diagnosis of SARS-CoV-2 by FDA under an Emergency Use Authorization (EUA).  This EUA will remain in effect (meaning this test can be used) for the duration of the COVID-19 declaration under Section 564(b)(1) of the Act, 21 U.S.C. section 360bbb-3(b)(1), unless the authorization is terminated or revoked sooner. Performed at [facility], [place] [address], [place],  [address]      Radiological Exams on Admission: Ct Abdomen Pelvis W Contrast  Result Date: 01/22/2019 CLINICAL DATA:  Perirectal pain and swelling EXAM: CT ABDOMEN AND PELVIS WITH CONTRAST TECHNIQUE: Multidetector CT imaging of the abdomen and pelvis was performed using the standard protocol following bolus administration of intravenous contrast.  CONTRAST:  175m OMNIPAQUE IOHEXOL 300 MG/ML  SOLN COMPARISON:  None. FINDINGS: Lower chest: No acute abnormality. Hepatobiliary: No  focal liver abnormality is seen. Status post cholecystectomy. No biliary dilatation. Pancreas: Unremarkable. No pancreatic ductal dilatation or surrounding inflammatory changes. Spleen: Normal in size without focal abnormality. Adrenals/Urinary Tract: Adrenal glands are unremarkable. Kidneys are normal, without renal calculi, focal lesion, or hydronephrosis. Bladder is unremarkable. Stomach/Bowel: Stomach is within normal limits. Appendix appears normal. No evidence of bowel wall thickening, distention, or inflammatory changes. Vascular/Lymphatic: Calcific atherosclerosis. No enlarged abdominal or pelvic lymph nodes. Reproductive: No mass or other abnormality. Other: No abdominal wall hernia or abnormality. No abdominopelvic ascites. There is subcutaneous emphysema in the soft tissues about the right aspect of the anus, perineum, and posterior right vulva (series 12, image 5, series 14, image 73). No discrete fluid collections appreciated. Musculoskeletal: No acute or significant osseous findings. IMPRESSION: There is subcutaneous emphysema in the soft tissues about the right aspect of the anus, perineum, and posterior right vulva (series 12, image 5, series 14, image 73). No discrete fluid collections appreciated. Findings are concerning for gas-forming infection (Fournier's gangrene). Recommend surgical consultation. Electronically Signed   By: Eddie Candle M.D.   On: 01/22/2019 23:14     EKG:  Not done in ED, will get one.   Assessment/Plan Principal Problem:   Fournier gangrene Active Problems:   Uncontrolled diabetes mellitus type 2 with atherosclerosis of arteries of extremities (HCC)   Obesity   Coronary artery disease   Tobacco use disorder   Asthma   Anxiety   Sepsis ([place])   Hypokalemia   COVID-19 virus infection  Sepsis due to  Fournier gangrene: Patient  meets criteria for sepsis with leukocytosis, fever, tachycardia and tachypnea.  Currently hemodynamically stable.  Lactic acid normal, 1.9, 1.2.  General surgeon, Dr. Redmond Pulling was consulted, will take patient to OR  - will admit to tele bed as inpt - Empiric antimicrobial treatment with vancomycin, clindamycin and aztreonam - PRN Zofran for nausea, Dilaudid and Percocet for pain - Blood cultures x 2  - ESR and CRP - will get Procalcitonin and trend lactic acid levels per sepsis protocol. - IVF: 2.5 L of NS bolus in ED, followed by 100 cc/h  Uncontrolled diabetes mellitus type 2 with atherosclerosis of arteries of extremities ([place]): Patient has not been compliant to taking insulin and diabetic medications.  Blood sugar 460, anion gap 14. Last A1c 10.5, poorly controled. Patient is supposed to take [place] 50 units daily, [place], [place], but taking any of them. -start Tresiba 35 units daily -SSI  Morbid obesity:  -Diet and exercise.   -Encouraged to lose weight.   Coronary artery disease: s/p of sent. -restart ASA -hold plavix due to surgery -prn NTG  Tobacco use disorder: -Did counseling about importance of quitting smoking -Nicotine patch  Anxiety: Continue home Xaxa  Hypokalemia: K3.3 -repleted  Asthma: stable -prn albuterol inhaler and mucinex  Addendum: pt's covid19 test comes back positive. Now she has COVID19 infection. Pt has mild dry cough, otherwise asymptomatic.  Patient denies chest pain, shortness of breath.  Her fever is likely due to for new gangrene.  -will check D-dimer (daily), BNP,Trop, LFT, CRP, LDH, Procalcitonin, IL-6, ferritin, fibinogen, TG, Hep B SAg, HIV ab -will get CXR  Physician PPE: face shield, N95, gown, glove (I used N95, gloves, face shield and goggle) Patient PPE: mask Fever: yes Cough: yes SOB: no URI symptoms: no GI symptoms: no Travel: no Sick contacts: no CBC: leukopenia, lymphopenia-->no BMP: increased BUN/Cr-->no LFTs:  increased AST/ALT/Tbili-->no CRP, LDH: pending Procalcitonin: pending CXR: hazy bilateral peripheral opacities-->pending CT chest:  GGO, consolidation, crazy paving-->not done COVID subjective risk assessment: low Precaution: Droplet and contact     Inpatient status:  # Patient requires inpatient status due to high intensity of service, high risk for further deterioration and high frequency of surveillance required.  I certify that at the point of admission it is my clinical judgment that the patient will require inpatient hospital care spanning beyond 2 midnights from the point of admission.  . This patient has multiple chronic comorbidities including poorly controlled diabetes, medication noncompliance, asthma, GERD, depression, anxiety, CAD, stent placement, tobacco abuse, obesity . Now patient has presenting with sepsis due to Fournier gangrene . The worrisome physical exam findings include tenderness, warmth, erythema and swelling in right perirectal area, right libia and right gluteal cleft.  . The initial radiographic and laboratory data are worrisome because of leukocytosis, hypokalemia, elevated blood sugar. CT-abd/pelvis showed subcutaneous emphysema in the soft tissues about the right aspect of the anus, perineum, and posterior right vulva, which is concerning for gas-forming infection. . Current medical needs: please see my assessment and plan . Predictability of an adverse outcome (risk): Patient has multiple comorbidities, now presents with sepsis due to Fournier gangrene.  CT scan finding is consistent with gas-forming infection.  Patient will need surgery.  Given her poorly controlled diabetes and multiple comorbidities, patient is at high risk of deteriorating.  Will need to be treated in hospital for at least 2 days.    DVT ppx: SCD Code Status: Full code Family Communication: None at bed side.    Disposition Plan:  Anticipate discharge back to previous home environment  Consults called:  Dr. Redmond Pulling of general surgeon Admission status:  Inpatient/tele     Date of Service 01/23/2019    [place] [facility]   If 7PM-7AM, please contact night-coverage [email or web address] Password [email or web address] 01/23/2019, 3:11 AM

## 2019-01-22 NOTE — Progress Notes (Signed)
Pharmacy Antibiotic Note  Maureen Price is a 43 y.o. female admitted on 01/22/2019 with wound infection.  Pharmacy has been consulted for vancomycin and aztreonam dosing. PCN allergy noted, with pt stating severe rxn with hospitalization + similar rxn with cephalosporin.    Plan: Vancomycin 1500mg  IV x1, then 1000mg  Iv every 12 hours (calc AUC 496, SCr 0.7) Aztreonam 2g IV every 8 hours Monitor renal function, Cx and clinical progression to narrow Vancomycin levels at steady state  Height: 5' 5.5" (166.4 cm) Weight: 215 lb (97.5 kg) IBW/kg (Calculated) : 58.15  Temp (24hrs), Avg:99.9 F (37.7 C), Min:99.6 F (37.6 C), Max:100.1 F (37.8 C)  Recent Labs  Lab 01/22/19 2037 01/22/19 2222  WBC 23.4*  --   CREATININE 0.70  --   LATICACIDVEN 1.9 1.2    Estimated Creatinine Clearance: 106.9 mL/min (by C-G formula based on SCr of 0.7 mg/dL).    Allergies  Allergen Reactions  . Erythromycin Anaphylaxis  . Penicillins Anaphylaxis  . Metformin And Related Other (See Comments)    Diarrhea, upset stomach  . Viibryd [Vilazodone Hcl] Other (See Comments)    Altered mental status.    . Bee Venom Swelling    Throat swells    Antimicrobials this admission: Vanc 5/17>> Aztreonam 5/17>>  Dose adjustments this admission: n/a  Microbiology results: 5/17 BCx: sent  Bertis Ruddy, PharmD Clinical Pharmacist Please check AMION for all [place] numbers 01/22/2019 11:54 PM

## 2019-01-23 ENCOUNTER — Encounter (HOSPITAL_COMMUNITY)
Admission: EM | Disposition: A | Discharge status: Home Health | Payer: Self-pay | Source: Home / Self Care | Attending: Family Medicine

## 2019-01-23 ENCOUNTER — Encounter (HOSPITAL_COMMUNITY): Payer: Self-pay | Admitting: Anesthesiology

## 2019-01-23 ENCOUNTER — Inpatient Hospital Stay (HOSPITAL_COMMUNITY): Payer: BC Managed Care – PPO | Admitting: Anesthesiology

## 2019-01-23 ENCOUNTER — Inpatient Hospital Stay (HOSPITAL_COMMUNITY): Payer: BC Managed Care – PPO

## 2019-01-23 DIAGNOSIS — E1151 Type 2 diabetes mellitus with diabetic peripheral angiopathy without gangrene: Secondary | ICD-10-CM | POA: Diagnosis not present

## 2019-01-23 DIAGNOSIS — F419 Anxiety disorder, unspecified: Secondary | ICD-10-CM | POA: Diagnosis not present

## 2019-01-23 DIAGNOSIS — J9601 Acute respiratory failure with hypoxia: Secondary | ICD-10-CM | POA: Diagnosis not present

## 2019-01-23 DIAGNOSIS — Z955 Presence of coronary angioplasty implant and graft: Secondary | ICD-10-CM | POA: Diagnosis not present

## 2019-01-23 DIAGNOSIS — E870 Hyperosmolality and hypernatremia: Secondary | ICD-10-CM | POA: Diagnosis not present

## 2019-01-23 DIAGNOSIS — Z7902 Long term (current) use of antithrombotics/antiplatelets: Secondary | ICD-10-CM | POA: Diagnosis not present

## 2019-01-23 DIAGNOSIS — A419 Sepsis, unspecified organism: Secondary | ICD-10-CM | POA: Diagnosis not present

## 2019-01-23 DIAGNOSIS — E871 Hypo-osmolality and hyponatremia: Secondary | ICD-10-CM | POA: Diagnosis not present

## 2019-01-23 DIAGNOSIS — I5032 Chronic diastolic (congestive) heart failure: Secondary | ICD-10-CM | POA: Insufficient documentation

## 2019-01-23 DIAGNOSIS — Z794 Long term (current) use of insulin: Secondary | ICD-10-CM | POA: Diagnosis not present

## 2019-01-23 DIAGNOSIS — Z7982 Long term (current) use of aspirin: Secondary | ICD-10-CM | POA: Diagnosis not present

## 2019-01-23 DIAGNOSIS — J9622 Acute and chronic respiratory failure with hypercapnia: Secondary | ICD-10-CM | POA: Diagnosis not present

## 2019-01-23 DIAGNOSIS — U071 COVID-19: Secondary | ICD-10-CM | POA: Diagnosis present

## 2019-01-23 DIAGNOSIS — R6520 Severe sepsis without septic shock: Secondary | ICD-10-CM | POA: Diagnosis present

## 2019-01-23 DIAGNOSIS — E872 Acidosis: Secondary | ICD-10-CM | POA: Diagnosis not present

## 2019-01-23 DIAGNOSIS — M5136 Other intervertebral disc degeneration, lumbar region: Secondary | ICD-10-CM | POA: Diagnosis present

## 2019-01-23 DIAGNOSIS — E876 Hypokalemia: Secondary | ICD-10-CM | POA: Diagnosis present

## 2019-01-23 DIAGNOSIS — N7689 Other specified inflammation of vagina and vulva: Secondary | ICD-10-CM | POA: Diagnosis not present

## 2019-01-23 DIAGNOSIS — K219 Gastro-esophageal reflux disease without esophagitis: Secondary | ICD-10-CM | POA: Diagnosis present

## 2019-01-23 DIAGNOSIS — M726 Necrotizing fasciitis: Secondary | ICD-10-CM | POA: Diagnosis not present

## 2019-01-23 DIAGNOSIS — I70209 Unspecified atherosclerosis of native arteries of extremities, unspecified extremity: Secondary | ICD-10-CM | POA: Diagnosis not present

## 2019-01-23 DIAGNOSIS — N493 Fournier gangrene: Secondary | ICD-10-CM | POA: Diagnosis present

## 2019-01-23 DIAGNOSIS — Z8261 Family history of arthritis: Secondary | ICD-10-CM | POA: Diagnosis not present

## 2019-01-23 DIAGNOSIS — N179 Acute kidney failure, unspecified: Secondary | ICD-10-CM | POA: Diagnosis not present

## 2019-01-23 DIAGNOSIS — J9621 Acute and chronic respiratory failure with hypoxia: Secondary | ICD-10-CM | POA: Diagnosis not present

## 2019-01-23 DIAGNOSIS — J45909 Unspecified asthma, uncomplicated: Secondary | ICD-10-CM | POA: Diagnosis not present

## 2019-01-23 DIAGNOSIS — Z833 Family history of diabetes mellitus: Secondary | ICD-10-CM | POA: Diagnosis not present

## 2019-01-23 DIAGNOSIS — J1289 Other viral pneumonia: Secondary | ICD-10-CM | POA: Diagnosis present

## 2019-01-23 DIAGNOSIS — I252 Old myocardial infarction: Secondary | ICD-10-CM | POA: Diagnosis not present

## 2019-01-23 DIAGNOSIS — Z6841 Body Mass Index (BMI) 40.0 and over, adult: Secondary | ICD-10-CM | POA: Diagnosis not present

## 2019-01-23 DIAGNOSIS — J80 Acute respiratory distress syndrome: Secondary | ICD-10-CM | POA: Diagnosis not present

## 2019-01-23 DIAGNOSIS — E119 Type 2 diabetes mellitus without complications: Secondary | ICD-10-CM | POA: Diagnosis not present

## 2019-01-23 DIAGNOSIS — I2510 Atherosclerotic heart disease of native coronary artery without angina pectoris: Secondary | ICD-10-CM | POA: Diagnosis not present

## 2019-01-23 DIAGNOSIS — E785 Hyperlipidemia, unspecified: Secondary | ICD-10-CM | POA: Diagnosis present

## 2019-01-23 HISTORY — PX: INCISION AND DRAINAGE PERIRECTAL ABSCESS: SHX1804

## 2019-01-23 LAB — URINALYSIS, ROUTINE W REFLEX MICROSCOPIC
Bacteria, UA: NONE SEEN
Bilirubin Urine: NEGATIVE
Glucose, UA: >=500 mg/dL — AB
Ketones, ur: 20 mg/dL — AB
Nitrite: NEGATIVE
Protein, ur: NEGATIVE mg/dL
Specific Gravity, Urine: 1.017 (ref 1.005–1.030)
pH: 6 (ref 5.0–8.0)

## 2019-01-23 LAB — PROTIME-INR
INR: 1.3 — ABNORMAL HIGH (ref 0.8–1.2)
Prothrombin Time: 15.8 seconds — ABNORMAL HIGH (ref 11.4–15.2)

## 2019-01-23 LAB — BASIC METABOLIC PANEL
Anion gap: 13 (ref 5–15)
BUN: 6 mg/dL (ref 6–20)
CO2: 20 mmol/L — ABNORMAL LOW (ref 22–32)
Calcium: 8.4 mg/dL — ABNORMAL LOW (ref 8.9–10.3)
Chloride: 103 mmol/L (ref 98–111)
Creatinine, Ser: 0.54 mg/dL (ref 0.44–1.00)
GFR calc Af Amer: >60 mL/min (ref >60)
GFR calc non Af Amer: >60 mL/min (ref >60)
Glucose, Bld: 269 mg/dL — ABNORMAL HIGH (ref 70–99)
Potassium: 3 mmol/L — ABNORMAL LOW (ref 3.5–5.1)
Sodium: 136 mmol/L (ref 135–145)

## 2019-01-23 LAB — RENAL FUNCTION PANEL
Albumin: 2.1 g/dL — ABNORMAL LOW (ref 3.5–5.0)
Anion gap: 14 (ref 5–15)
BUN: 6 mg/dL (ref 6–20)
CO2: 18 mmol/L — ABNORMAL LOW (ref 22–32)
Calcium: 8.3 mg/dL — ABNORMAL LOW (ref 8.9–10.3)
Chloride: 104 mmol/L (ref 98–111)
Creatinine, Ser: 0.59 mg/dL (ref 0.44–1.00)
GFR calc Af Amer: >60 mL/min (ref >60)
GFR calc non Af Amer: >60 mL/min (ref >60)
Glucose, Bld: 259 mg/dL — ABNORMAL HIGH (ref 70–99)
Phosphorus: 3.4 mg/dL (ref 2.5–4.6)
Potassium: 3.4 mmol/L — ABNORMAL LOW (ref 3.5–5.1)
Sodium: 136 mmol/L (ref 135–145)

## 2019-01-23 LAB — LACTATE DEHYDROGENASE: LDH: 123 U/L (ref 98–192)

## 2019-01-23 LAB — GLUCOSE, CAPILLARY
Glucose-Capillary: 331 mg/dL — ABNORMAL HIGH (ref 70–99)
Glucose-Capillary: 302 mg/dL — ABNORMAL HIGH (ref 70–99)
Glucose-Capillary: 305 mg/dL — ABNORMAL HIGH (ref 70–99)
Glucose-Capillary: 308 mg/dL — ABNORMAL HIGH (ref 70–99)
Glucose-Capillary: 278 mg/dL — ABNORMAL HIGH (ref 70–99)
Glucose-Capillary: 161 mg/dL — ABNORMAL HIGH (ref 70–99)
Glucose-Capillary: 206 mg/dL — ABNORMAL HIGH (ref 70–99)
Glucose-Capillary: 120 mg/dL — ABNORMAL HIGH (ref 70–99)
Glucose-Capillary: 157 mg/dL — ABNORMAL HIGH (ref 70–99)
Glucose-Capillary: 242 mg/dL — ABNORMAL HIGH (ref 70–99)
Glucose-Capillary: 441 mg/dL — ABNORMAL HIGH (ref 70–99)
Glucose-Capillary: 449 mg/dL — ABNORMAL HIGH (ref 70–99)

## 2019-01-23 LAB — HEMOGLOBIN A1C
Hgb A1c MFr Bld: 11.3 % — ABNORMAL HIGH (ref 4.8–5.6)
Mean Plasma Glucose: 277.61 mg/dL

## 2019-01-23 LAB — APTT: aPTT: 30 seconds (ref 24–36)

## 2019-01-23 LAB — TROPONIN I: Troponin I: 0.03 ng/mL (ref <0.03)

## 2019-01-23 LAB — C-REACTIVE PROTEIN: CRP: 27.2 mg/dL — ABNORMAL HIGH (ref <1.0)

## 2019-01-23 LAB — CBC
HCT: 34.1 % — ABNORMAL LOW (ref 36.0–46.0)
Hemoglobin: 12.1 g/dL (ref 12.0–15.0)
MCH: 27.8 pg (ref 26.0–34.0)
MCHC: 35.5 g/dL (ref 30.0–36.0)
MCV: 78.4 fL — ABNORMAL LOW (ref 80.0–100.0)
Platelets: 254 10*3/uL (ref 150–400)
RBC: 4.35 MIL/uL (ref 3.87–5.11)
RDW: 12.8 % (ref 11.5–15.5)
WBC: 25.8 10*3/uL — ABNORMAL HIGH (ref 4.0–10.5)
nRBC: 0 % (ref 0.0–0.2)

## 2019-01-23 LAB — SEDIMENTATION RATE: Sed Rate: 79 mm/hr — ABNORMAL HIGH (ref 0–22)

## 2019-01-23 LAB — D-DIMER, QUANTITATIVE: D-Dimer, Quant: 1.53 ug/mL-FEU — ABNORMAL HIGH (ref 0.00–0.50)

## 2019-01-23 LAB — FIBRINOGEN: Fibrinogen: 705 mg/dL — ABNORMAL HIGH (ref 210–475)

## 2019-01-23 LAB — FERRITIN: Ferritin: 206 ng/mL (ref 11–307)

## 2019-01-23 LAB — SARS CORONAVIRUS 2 BY RT PCR (HOSPITAL ORDER, PERFORMED IN ~~LOC~~ HOSPITAL LAB): SARS Coronavirus 2: POSITIVE — AB

## 2019-01-23 LAB — TRIGLYCERIDES: Triglycerides: 92 mg/dL (ref <150)

## 2019-01-23 LAB — ABO/RH: ABO/RH(D): O POS

## 2019-01-23 LAB — BRAIN NATRIURETIC PEPTIDE: B Natriuretic Peptide: 62.6 pg/mL (ref 0.0–100.0)

## 2019-01-23 LAB — PROCALCITONIN: Procalcitonin: 0.24 ng/mL

## 2019-01-23 LAB — HIV ANTIBODY (ROUTINE TESTING W REFLEX): HIV Screen 4th Generation wRfx: NONREACTIVE

## 2019-01-23 SURGERY — INCISION AND DRAINAGE, ABSCESS
Anesthesia: General | Site: Groin

## 2019-01-23 SURGERY — INCISION AND DRAINAGE, ABSCESS, PERIRECTAL
Anesthesia: General | Site: Perineum

## 2019-01-23 MED ORDER — FENTANYL CITRATE (PF) 250 MCG/5ML IJ SOLN
INTRAMUSCULAR | Status: AC
  Filled 2019-01-23: qty 5

## 2019-01-23 MED ORDER — ACETAMINOPHEN 650 MG RE SUPP: 650.0000 mg | Freq: Four times a day (QID) | RECTAL | Status: DC | PRN

## 2019-01-23 MED ORDER — MIDAZOLAM HCL 2 MG/2ML IJ SOLN
INTRAMUSCULAR | Status: AC
  Filled 2019-01-23: qty 2

## 2019-01-23 MED ORDER — ASPIRIN EC 81 MG PO TBEC
81.0000 mg | DELAYED_RELEASE_TABLET | Freq: Every day | ORAL | Status: DC
  Administered 2019-01-23 – 2019-01-25 (×3): 81 mg via ORAL
  Filled 2019-01-23 (×3): qty 1

## 2019-01-23 MED ORDER — SUCCINYLCHOLINE CHLORIDE 200 MG/10ML IV SOSY
PREFILLED_SYRINGE | INTRAVENOUS | Status: DC | PRN
  Administered 2019-01-23: 100 mg via INTRAVENOUS

## 2019-01-23 MED ORDER — FENTANYL CITRATE (PF) 100 MCG/2ML IJ SOLN
INTRAMUSCULAR | Status: AC | PRN
  Administered 2019-01-23: 100 ug via INTRAVENOUS
  Administered 2019-01-23 (×2): 50 ug via INTRAVENOUS
  Administered 2019-01-23: 25 ug via INTRAVENOUS
  Administered 2019-01-23: 100 ug via INTRAVENOUS

## 2019-01-23 MED ORDER — NICOTINE 21 MG/24HR TD PT24
21.0000 mg | MEDICATED_PATCH | Freq: Every day | TRANSDERMAL | Status: DC
  Administered 2019-01-23 – 2019-01-25 (×2): 21 mg via TRANSDERMAL
  Filled 2019-01-23 (×2): qty 1

## 2019-01-23 MED ORDER — POTASSIUM CHLORIDE CRYS ER 20 MEQ PO TBCR
40.0000 meq | EXTENDED_RELEASE_TABLET | Freq: Once | ORAL | Status: AC
  Administered 2019-01-23: 40 meq via ORAL
  Filled 2019-01-23: qty 2

## 2019-01-23 MED ORDER — HYDROMORPHONE HCL 1 MG/ML IJ SOLN
1.0000 mg | INTRAMUSCULAR | Status: DC | PRN
  Administered 2019-01-23 – 2019-01-24 (×4): 1 mg via INTRAVENOUS
  Filled 2019-01-23 (×5): qty 1

## 2019-01-23 MED ORDER — INSULIN REGULAR(HUMAN) IN NACL 100-0.9 UT/100ML-% IV SOLN
INTRAVENOUS | Status: AC
  Filled 2019-01-23: qty 100

## 2019-01-23 MED ORDER — PROPOFOL 10 MG/ML IV BOLUS
INTRAVENOUS | Status: AC
  Filled 2019-01-23: qty 20

## 2019-01-23 MED ORDER — LACTATED RINGERS IV SOLN
INTRAVENOUS | Status: DC | PRN
  Administered 2019-01-23: 12:00:00 via INTRAVENOUS

## 2019-01-23 MED ORDER — INSULIN DEGLUDEC 200 UNIT/ML ~~LOC~~ SOPN: 35.0000 [IU] | PEN_INJECTOR | Freq: Every day | SUBCUTANEOUS | Status: DC

## 2019-01-23 MED ORDER — CALCIUM CARBONATE ANTACID 500 MG PO CHEW
1.0000 | CHEWABLE_TABLET | Freq: Every day | ORAL | Status: DC
  Administered 2019-01-23 – 2019-01-25 (×3): 200 mg via ORAL
  Filled 2019-01-23 (×3): qty 1

## 2019-01-23 MED ORDER — ONDANSETRON HCL 4 MG/2ML IJ SOLN
INTRAMUSCULAR | Status: AC
  Filled 2019-01-23: qty 2

## 2019-01-23 MED ORDER — ALBUTEROL SULFATE (2.5 MG/3ML) 0.083% IN NEBU: 3.0000 mL | INHALATION_SOLUTION | RESPIRATORY_TRACT | Status: DC | PRN

## 2019-01-23 MED ORDER — CLINDAMYCIN PHOSPHATE 900 MG/50ML IV SOLN
900.0000 mg | Freq: Three times a day (TID) | INTRAVENOUS | Status: DC
  Administered 2019-01-23 – 2019-01-26 (×10): 900 mg via INTRAVENOUS
  Filled 2019-01-23 (×14): qty 50

## 2019-01-23 MED ORDER — ONDANSETRON HCL 4 MG/2ML IJ SOLN
4.0000 mg | Freq: Four times a day (QID) | INTRAMUSCULAR | Status: DC | PRN
  Administered 2019-01-23 – 2019-01-25 (×3): 4 mg via INTRAVENOUS
  Filled 2019-01-23 (×3): qty 2

## 2019-01-23 MED ORDER — SODIUM CHLORIDE 0.9 % IV BOLUS
1500.0000 mL | Freq: Once | INTRAVENOUS | Status: AC
  Administered 2019-01-23: 01:00:00 1500 mL via INTRAVENOUS

## 2019-01-23 MED ORDER — ONDANSETRON HCL 4 MG PO TABS
4.0000 mg | ORAL_TABLET | Freq: Four times a day (QID) | ORAL | Status: DC | PRN
  Administered 2019-01-24: 4 mg via ORAL
  Filled 2019-01-23: qty 1

## 2019-01-23 MED ORDER — SODIUM CHLORIDE 0.9 % IV SOLN
INTRAVENOUS | Status: DC | PRN
  Administered 2019-01-23: 2.7 [IU]/h via INTRAVENOUS

## 2019-01-23 MED ORDER — VITAMIN C 500 MG PO TABS
1000.0000 mg | ORAL_TABLET | Freq: Every day | ORAL | Status: DC
  Administered 2019-01-23 – 2019-01-25 (×3): 1000 mg via ORAL
  Filled 2019-01-23 (×2): qty 2

## 2019-01-23 MED ORDER — ALPRAZOLAM 0.5 MG PO TABS
0.5000 mg | ORAL_TABLET | Freq: Two times a day (BID) | ORAL | Status: DC | PRN
  Administered 2019-01-23 – 2019-01-30 (×3): 0.5 mg via ORAL
  Filled 2019-01-23 (×4): qty 1

## 2019-01-23 MED ORDER — ROCURONIUM BROMIDE 10 MG/ML (PF) SYRINGE
PREFILLED_SYRINGE | INTRAVENOUS | Status: AC
  Filled 2019-01-23: qty 10

## 2019-01-23 MED ORDER — INSULIN ASPART 100 UNIT/ML ~~LOC~~ SOLN
0.0000 [IU] | Freq: Three times a day (TID) | SUBCUTANEOUS | Status: DC
  Administered 2019-01-23: 3 [IU] via SUBCUTANEOUS
  Administered 2019-01-24: 9 [IU] via SUBCUTANEOUS
  Administered 2019-01-24: 5 [IU] via SUBCUTANEOUS
  Administered 2019-01-24: 7 [IU] via SUBCUTANEOUS
  Administered 2019-01-25: 3 [IU] via SUBCUTANEOUS

## 2019-01-23 MED ORDER — OXYCODONE-ACETAMINOPHEN 5-325 MG PO TABS
1.0000 | ORAL_TABLET | ORAL | Status: DC | PRN
  Administered 2019-01-23 – 2019-01-24 (×5): 1 via ORAL
  Filled 2019-01-23 (×5): qty 1

## 2019-01-23 MED ORDER — MIDAZOLAM HCL 5 MG/5ML IJ SOLN
INTRAMUSCULAR | Status: DC | PRN
  Administered 2019-01-23: 2 mg via INTRAVENOUS

## 2019-01-23 MED ORDER — SUGAMMADEX SODIUM 200 MG/2ML IV SOLN
INTRAVENOUS | Status: DC | PRN
  Administered 2019-01-23: 360 mg via INTRAVENOUS

## 2019-01-23 MED ORDER — OMEGA-3-ACID ETHYL ESTERS 1 G PO CAPS
1.0000 g | ORAL_CAPSULE | Freq: Every day | ORAL | Status: DC
  Administered 2019-01-24 – 2019-01-25 (×2): 1 g via ORAL
  Filled 2019-01-23 (×3): qty 1

## 2019-01-23 MED ORDER — EPHEDRINE 5 MG/ML INJ
INTRAVENOUS | Status: AC
  Filled 2019-01-23: qty 10

## 2019-01-23 MED ORDER — PROPOFOL 10 MG/ML IV BOLUS
INTRAVENOUS | Status: DC | PRN
  Administered 2019-01-23: 150 mg via INTRAVENOUS

## 2019-01-23 MED ORDER — 0.9 % SODIUM CHLORIDE (POUR BTL) OPTIME
TOPICAL | Status: DC | PRN
  Administered 2019-01-23 (×2): 1000 mL

## 2019-01-23 MED ORDER — ESMOLOL HCL 100 MG/10ML IV SOLN
INTRAVENOUS | Status: AC
  Filled 2019-01-23: qty 10

## 2019-01-23 MED ORDER — VITAMIN B-12 1000 MCG PO TABS
2000.0000 ug | ORAL_TABLET | Freq: Every day | ORAL | Status: DC
  Administered 2019-01-23 – 2019-01-28 (×6): 2000 ug via ORAL
  Filled 2019-01-23 (×8): qty 2

## 2019-01-23 MED ORDER — NITROGLYCERIN 0.4 MG SL SUBL: 0.4000 mg | SUBLINGUAL_TABLET | SUBLINGUAL | Status: DC | PRN

## 2019-01-23 MED ORDER — INSULIN ASPART 100 UNIT/ML ~~LOC~~ SOLN: 0.0000 [IU] | Freq: Every day | SUBCUTANEOUS | Status: DC

## 2019-01-23 MED ORDER — LIDOCAINE 2% (20 MG/ML) 5 ML SYRINGE
INTRAMUSCULAR | Status: DC | PRN
  Administered 2019-01-23: 80 mg via INTRAVENOUS

## 2019-01-23 MED ORDER — SUCCINYLCHOLINE CHLORIDE 200 MG/10ML IV SOSY
PREFILLED_SYRINGE | INTRAVENOUS | Status: AC
  Filled 2019-01-23: qty 10

## 2019-01-23 MED ORDER — ADULT MULTIVITAMIN W/MINERALS CH
1.0000 | ORAL_TABLET | Freq: Every day | ORAL | Status: DC
  Administered 2019-01-23 – 2019-01-25 (×3): 1 via ORAL
  Filled 2019-01-23 (×3): qty 1

## 2019-01-23 MED ORDER — INSULIN GLARGINE 100 UNIT/ML ~~LOC~~ SOLN
36.0000 [IU] | Freq: Every day | SUBCUTANEOUS | Status: DC
  Administered 2019-01-23 – 2019-01-24 (×2): 36 [IU] via SUBCUTANEOUS
  Filled 2019-01-23 (×3): qty 0.36

## 2019-01-23 MED ORDER — SODIUM CHLORIDE 0.9 % IV SOLN
2.0000 g | INTRAVENOUS | Status: DC
  Filled 2019-01-23: qty 2

## 2019-01-23 MED ORDER — LIDOCAINE 2% (20 MG/ML) 5 ML SYRINGE
INTRAMUSCULAR | Status: AC
  Filled 2019-01-23: qty 5

## 2019-01-23 MED ORDER — SODIUM CHLORIDE 0.9 % IV SOLN
INTRAVENOUS | Status: DC
  Administered 2019-01-23 – 2019-01-29 (×11): via INTRAVENOUS

## 2019-01-23 MED ORDER — INSULIN ASPART 100 UNIT/ML ~~LOC~~ SOLN
6.0000 [IU] | Freq: Once | SUBCUTANEOUS | Status: AC
  Administered 2019-01-23: 6 [IU] via SUBCUTANEOUS

## 2019-01-23 MED ORDER — ALBUTEROL SULFATE HFA 108 (90 BASE) MCG/ACT IN AERS
2.0000 | INHALATION_SPRAY | RESPIRATORY_TRACT | Status: DC | PRN
  Filled 2019-01-23: qty 6.7

## 2019-01-23 MED ORDER — ESMOLOL HCL 100 MG/10ML IV SOLN
INTRAVENOUS | Status: DC | PRN
  Administered 2019-01-23: 30 mg via INTRAVENOUS
  Administered 2019-01-23: 40 mg via INTRAVENOUS
  Administered 2019-01-23 (×2): 50 mg via INTRAVENOUS
  Administered 2019-01-23: 30 mg via INTRAVENOUS

## 2019-01-23 MED ORDER — HYDRALAZINE HCL 20 MG/ML IJ SOLN: 5.0000 mg | INTRAMUSCULAR | Status: DC | PRN

## 2019-01-23 MED ORDER — ONDANSETRON HCL 4 MG/2ML IJ SOLN
INTRAMUSCULAR | Status: DC | PRN
  Administered 2019-01-23: 4 mg via INTRAVENOUS

## 2019-01-23 MED ORDER — ACETAMINOPHEN 325 MG PO TABS
650.0000 mg | ORAL_TABLET | Freq: Four times a day (QID) | ORAL | Status: DC | PRN
  Administered 2019-01-23 (×2): 650 mg via ORAL
  Filled 2019-01-23 (×4): qty 2

## 2019-01-23 MED ORDER — PHENYLEPHRINE 40 MCG/ML (10ML) SYRINGE FOR IV PUSH (FOR BLOOD PRESSURE SUPPORT)
PREFILLED_SYRINGE | INTRAVENOUS | Status: AC
  Filled 2019-01-23: qty 10

## 2019-01-23 MED ORDER — LIDOCAINE HCL (PF) 1 % IJ SOLN
INTRAMUSCULAR | Status: AC
  Filled 2019-01-23: qty 30

## 2019-01-23 MED ORDER — DM-GUAIFENESIN ER 30-600 MG PO TB12
1.0000 | ORAL_TABLET | Freq: Two times a day (BID) | ORAL | Status: DC | PRN
  Administered 2019-01-23: 1 via ORAL
  Filled 2019-01-23 (×3): qty 1

## 2019-01-23 MED ORDER — SODIUM CHLORIDE 0.9 % IV BOLUS: 1000.0000 mL | Freq: Once | INTRAVENOUS | Status: DC

## 2019-01-23 MED ORDER — BUPIVACAINE-EPINEPHRINE (PF) 0.25% -1:200000 IJ SOLN
INTRAMUSCULAR | Status: AC
  Filled 2019-01-23: qty 30

## 2019-01-23 MED ORDER — ROCURONIUM BROMIDE 10 MG/ML (PF) SYRINGE
PREFILLED_SYRINGE | INTRAVENOUS | Status: DC | PRN
  Administered 2019-01-23: 50 mg via INTRAVENOUS

## 2019-01-23 SURGICAL SUPPLY — 27 items
BNDG GAUZE ELAST 4 BULKY (GAUZE/BANDAGES/DRESSINGS) ×2 IMPLANT
CANISTER SUCT 3000ML PPV (MISCELLANEOUS) ×3 IMPLANT
COVER SURGICAL LIGHT HANDLE (MISCELLANEOUS) ×3 IMPLANT
COVER WAND RF STERILE (DRAPES) ×3 IMPLANT
ELECT CAUTERY BLADE 6.4 (BLADE) ×3 IMPLANT
ELECT REM PT RETURN 9FT ADLT (ELECTROSURGICAL) ×3
ELECTRODE REM PT RTRN 9FT ADLT (ELECTROSURGICAL) IMPLANT
GLOVE BIO SURGEON STRL SZ7 (GLOVE) ×3 IMPLANT
GLOVE BIOGEL PI IND STRL 7.5 (GLOVE) ×1 IMPLANT
GLOVE BIOGEL PI INDICATOR 7.5 (GLOVE) ×2
GOWN STRL REUS W/ TWL LRG LVL3 (GOWN DISPOSABLE) ×2 IMPLANT
GOWN STRL REUS W/TWL LRG LVL3 (GOWN DISPOSABLE) ×6
KIT BASIN OR (CUSTOM PROCEDURE TRAY) ×3 IMPLANT
KIT TURNOVER KIT B (KITS) ×3 IMPLANT
NDL HYPO 25GX1X1/2 BEV (NEEDLE) ×1 IMPLANT
NEEDLE HYPO 25GX1X1/2 BEV (NEEDLE) ×3 IMPLANT
NS IRRIG 1000ML POUR BTL (IV SOLUTION) ×3 IMPLANT
PACK LITHOTOMY IV (CUSTOM PROCEDURE TRAY) ×3 IMPLANT
PAD ABD 8X10 STRL (GAUZE/BANDAGES/DRESSINGS) ×2 IMPLANT
PAD ARMBOARD 7.5X6 YLW CONV (MISCELLANEOUS) ×5 IMPLANT
PENCIL SMOKE EVACUATOR (MISCELLANEOUS) ×3 IMPLANT
SURGILUBE 2OZ TUBE FLIPTOP (MISCELLANEOUS) ×2 IMPLANT
SWAB COLLECTION DEVICE MRSA (MISCELLANEOUS) ×2 IMPLANT
SWAB CULTURE ESWAB REG 1ML (MISCELLANEOUS) ×2 IMPLANT
SYR CONTROL 10ML LL (SYRINGE) ×3 IMPLANT
TOWEL GREEN STERILE (TOWEL DISPOSABLE) ×2 IMPLANT
UNDERPAD 30X30 (UNDERPADS AND DIAPERS) ×3 IMPLANT

## 2019-01-23 NOTE — Interval H&P Note (Signed)
History and Physical Interval Note:  01/23/2019 9:19 AM Discussed incision, debridement, possible multiple surgeries. Discussed increased mortality with sars cov2 positive test.  Will proceed this am Maureen Price  has presented today for surgery, with the diagnosis of Fournier's Gangrene.  The various methods of treatment have been discussed with the patient and family. After consideration of risks, benefits and other options for treatment, the patient has consented to  Procedure(s): IRRIGATION AND DEBRIDEMENT PERIRECTAL ABSCESS (N/A) as a surgical intervention.  The patient's history has been reviewed, patient examined, no change in status, stable for surgery.  I have reviewed the patient's chart and labs.  Questions were answered to the patient's satisfaction.     Rolm Bookbinder

## 2019-01-23 NOTE — Consult Note (Signed)
Reason for Consult:perineal pain Referring Physician: Ward, PA  Maureen Price is an 43 y.o. female.  HPI: 43 year old female with a history of MI, coronary artery disease, tobacco use, diabetes mellitus type 2 who started having some buttock and perirectal pain a few days ago.  This area started bothering her on Thursday.  She feels that the area has gotten worse.  She tried warm tub soaks without relief.  She has been having some chills but no fever.  She has not been able to pick up her prescription insulin for several days.  She has not taken a blood thinner in about a year she states.  No nausea or vomiting.  No chest pain or chest pressure.  She reports some foul-smelling drainage from the area.  No prior symptoms  Past Medical History:  Diagnosis Date   Acute myocardial infarction of other anterior wall, initial episode of care    Anginal pain ([place])    Anxiety    Arthritis    Asthma    CAD (coronary artery disease)    CHF (congestive heart failure) ([place])    Depression    Diabetes mellitus    2002; no meds since 2004   Dyslipidemia, goal LDL below 70 10/26/2013   Dysrhythmia    GERD (gastroesophageal reflux disease)    Headache(784.0)    Obesity    Paroxysmal ventricular tachycardia (HCC)    Shortness of breath    STEMI (ST elevation myocardial infarction) ([place])    Tobacco abuse     Past Surgical History:  Procedure Laterality Date   CESAREAN SECTION     CESAREAN SECTION  [phone]   CHOLECYSTECTOMY     CORONARY ANGIOPLASTY WITH STENT PLACEMENT     GALLBLADDER SURGERY     LEFT HEART CATH N/A 10/24/2013   Procedure: LEFT HEART CATH;  Surgeon: Lorretta Harp, MD;  Location: [facility] CATH LAB;  Service: Cardiovascular;  Laterality: N/A;   LEFT HEART CATHETERIZATION WITH CORONARY ANGIOGRAM N/A 10/26/2013   Procedure: LEFT HEART CATHETERIZATION WITH CORONARY ANGIOGRAM;  Surgeon: Burnell Blanks, MD;  Location: [facility] CATH LAB;  Service:  Cardiovascular;  Laterality: N/A;   LEFT HEART CATHETERIZATION WITH CORONARY ANGIOGRAM N/A 03/29/2014   Procedure: LEFT HEART CATHETERIZATION WITH CORONARY ANGIOGRAM;  Surgeon: Lorretta Harp, MD;  Location: [facility] CATH LAB;  Service: Cardiovascular;  Laterality: N/A;   TUBAL LIGATION     2000    Family History  Problem Relation Age of Onset   Arthritis Mother    Diabetes Mother    Heart disease Father    Stroke Father    Diabetes Maternal Grandfather    Cancer Neg Hx    Alcohol abuse Neg Hx    Early death Neg Hx    Kidney disease Neg Hx    Hypertension Neg Hx    Hyperlipidemia Neg Hx    Thyroid disease Neg Hx     Social History:  reports that she has been smoking cigarettes. She has a 10.00 pack-year smoking history. She has never used smokeless tobacco. She reports that she does not drink alcohol or use drugs.  Allergies:  Allergies  Allergen Reactions   Erythromycin Anaphylaxis   Penicillins Anaphylaxis   Metformin And Related Other (See Comments)    Diarrhea, upset stomach   Viibryd [Vilazodone Hcl] Other (See Comments)    Altered mental status.     Bee Venom Swelling    Throat swells    Medications: I have reviewed the patient's current  medications.  Results for orders placed or performed during the hospital encounter of 01/22/19 (from the past 48 hour(s))  CBC with Differential     Status: Abnormal   Collection Time: 01/22/19  8:37 PM  Result Value Ref Range   WBC 23.4 (H) 4.0 - 10.5 K/uL   RBC 4.83 3.87 - 5.11 MIL/uL   Hemoglobin 13.3 12.0 - 15.0 g/dL   HCT 38.7 36.0 - 46.0 %   MCV 80.1 80.0 - 100.0 fL   MCH 27.5 26.0 - 34.0 pg   MCHC 34.4 30.0 - 36.0 g/dL   RDW 12.9 11.5 - 15.5 %   Platelets 251 150 - 400 K/uL   nRBC 0.0 0.0 - 0.2 %   Neutrophils Relative % 84 %   Neutro Abs 19.6 (H) 1.7 - 7.7 K/uL   Lymphocytes Relative 8 %   Lymphs Abs 1.9 0.7 - 4.0 K/uL   Monocytes Relative 7 %   Monocytes Absolute 1.6 (H) 0.1 - 1.0 K/uL    Eosinophils Relative 0 %   Eosinophils Absolute 0.0 0.0 - 0.5 K/uL   Basophils Relative 0 %   Basophils Absolute 0.0 0.0 - 0.1 K/uL   Immature Granulocytes 1 %   Abs Immature Granulocytes 0.20 (H) 0.00 - 0.07 K/uL    Comment: Performed at [facility], [address], [place], [place] [address]  Comprehensive metabolic panel     Status: Abnormal   Collection Time: 01/22/19  8:37 PM  Result Value Ref Range   Sodium 127 (L) 135 - 145 mmol/L   Potassium 3.3 (L) 3.5 - 5.1 mmol/L   Chloride 93 (L) 98 - 111 mmol/L   CO2 20 (L) 22 - 32 mmol/L   Glucose, Bld 460 (H) 70 - 99 mg/dL   BUN 6 6 - 20 mg/dL   Creatinine, Ser 0.70 0.44 - 1.00 mg/dL   Calcium 8.6 (L) 8.9 - 10.3 mg/dL   Total Protein 6.9 6.5 - 8.1 g/dL   Albumin 2.7 (L) 3.5 - 5.0 g/dL   AST 18 15 - 41 U/L   ALT 23 0 - 44 U/L   Alkaline Phosphatase 134 (H) 38 - 126 U/L   Total Bilirubin 0.8 0.3 - 1.2 mg/dL   GFR calc non Af Amer >60 >60 mL/min   GFR calc Af Amer >60 >60 mL/min   Anion gap 14 5 - 15    Comment: Performed at [facility], [place] [address] [place], [place] [address]  Lactic acid, plasma     Status: None   Collection Time: 01/22/19  8:37 PM  Result Value Ref Range   Lactic Acid, Venous 1.9 0.5 - 1.9 mmol/L    Comment: Performed at [facility] [address], [place], [place] [address]  I-Stat beta hCG blood, ED     Status: None   Collection Time: 01/22/19  9:21 PM  Result Value Ref Range   I-stat hCG, quantitative <5.0 <5 mIU/mL   Comment 3            Comment:   GEST. AGE      CONC.  (mIU/mL)   <=1 WEEK        5 - 50     2 WEEKS       50 - 500     3 WEEKS       100 - 10,000     4 WEEKS     1,000 - 30,000  FEMALE AND NON-PREGNANT FEMALE:     LESS THAN 5 mIU/mL   Lactic acid, plasma     Status: None   Collection Time: 01/22/19 10:22 PM  Result Value Ref Range   Lactic Acid, Venous 1.2 0.5 - 1.9 mmol/L    Comment: Performed at [facility], [place] [address], [place], [place]  [address]    Ct Abdomen Pelvis W Contrast  Result Date: 01/22/2019 CLINICAL DATA:  Perirectal pain and swelling EXAM: CT ABDOMEN AND PELVIS WITH CONTRAST TECHNIQUE: Multidetector CT imaging of the abdomen and pelvis was performed using the standard protocol following bolus administration of intravenous contrast. CONTRAST:  142mL OMNIPAQUE IOHEXOL 300 MG/ML  SOLN COMPARISON:  None. FINDINGS: Lower chest: No acute abnormality. Hepatobiliary: No focal liver abnormality is seen. Status post cholecystectomy. No biliary dilatation. Pancreas: Unremarkable. No pancreatic ductal dilatation or surrounding inflammatory changes. Spleen: Normal in size without focal abnormality. Adrenals/Urinary Tract: Adrenal glands are unremarkable. Kidneys are normal, without renal calculi, focal lesion, or hydronephrosis. Bladder is unremarkable. Stomach/Bowel: Stomach is within normal limits. Appendix appears normal. No evidence of bowel wall thickening, distention, or inflammatory changes. Vascular/Lymphatic: Calcific atherosclerosis. No enlarged abdominal or pelvic lymph nodes. Reproductive: No mass or other abnormality. Other: No abdominal wall hernia or abnormality. No abdominopelvic ascites. There is subcutaneous emphysema in the soft tissues about the right aspect of the anus, perineum, and posterior right vulva (series 12, image 5, series 14, image 73). No discrete fluid collections appreciated. Musculoskeletal: No acute or significant osseous findings. IMPRESSION: There is subcutaneous emphysema in the soft tissues about the right aspect of the anus, perineum, and posterior right vulva (series 12, image 5, series 14, image 73). No discrete fluid collections appreciated. Findings are concerning for gas-forming infection (Fournier's gangrene). Recommend surgical consultation. Electronically Signed   By: Eddie Candle M.D.   On: 01/22/2019 23:14    Review of Systems  All other systems reviewed and are negative.  Blood pressure  134/77, pulse (!) 106, temperature 99.6 F (37.6 C), temperature source Oral, resp. rate (!) 23, height 5' 5.5" (1.664 m), weight 97.5 kg, last menstrual period 12/29/2018, SpO2 97 %. Physical Exam  Vitals reviewed. Constitutional: She is oriented to person, place, and time. She appears well-developed and well-nourished. No distress.  Appears a little uncomfortable; hospitalist and nurse at [facility]:  Head: Normocephalic and atraumatic.  Right Ear: External ear normal.  Left Ear: External ear normal.  Eyes: Conjunctivae are normal. No scleral icterus.  Neck: Normal range of motion. Neck supple. No tracheal deviation present. No thyromegaly present.  Cardiovascular: Normal rate and normal heart sounds.  Respiratory: Effort normal and breath sounds normal. No stridor. No respiratory distress. She has no wheezes.  GI: Soft. She exhibits no distension. There is no abdominal tenderness. There is no rebound.  Old c/s scar  Genitourinary:       Genitourinary Comments: Extensive induration Rt perineum extending to labia with cellulitis; very ttp. No obvious crepitus. Exam limited to severe pain.    Musculoskeletal:        General: No tenderness or edema.  Lymphadenopathy:    She has no cervical adenopathy.  Neurological: She is alert and oriented to person, place, and time. She exhibits normal muscle tone.  Skin: Skin is warm and dry. No rash noted. She is not diaphoretic. There is erythema. No pallor.  Psychiatric: She has a normal mood and affect. Her behavior is normal. Judgment and thought content normal.  Assessment/Plan: Fournier's gangrene Uncontrolled diabetes mellitus type 2 Hyponatremia Hypokalemia History of coronary artery disease History of CHF History of dyslipidemia  Patient has extensive induration and extreme tenderness to palpation in the right perineal area extending to her labia.  There is gas in the soft tissue on CT imaging.  I have recommended to the patient  that we go to the operating room this evening for incision, drainage and debridement of this area.  I explained to her that I do not know the extent of the infection that we needed to remove all nonviable tissue.  We discussed that she may require more than 1 trip to the operating room.  We discussed that this would result in a large wound that would have to heal by secondary intention.  We discussed this require daily dressing changes.  We discussed that proper management of her diabetes would be essential to promote healing.  I discussed the typical postoperative course.  Also discussed risk and benefits including but not limited to bleeding, infection, blood clot formation, scarring, injury to surrounding structures, need for additional procedures, perioperative cardiac and pulmonary events, pain  Admit to medical service for management of her other comorbidities IV antibiotics We will get wound cultures in [place]. Redmond Pulling, MD, FACS General, Bariatric, & Minimally Invasive Surgery [facility]   Greer Pickerel 01/23/2019, 12:37 AM

## 2019-01-23 NOTE — Transfer of Care (Signed)
Immediate Anesthesia Transfer of Care Note  Patient: Maureen Price  Procedure(s) Performed: IRRIGATION AND DEBRIDEMENT PERIRECTAL ABSCESS (N/A Perineum)  Patient Location: PACU and Nursing Unit  Anesthesia Type:General  Level of Consciousness: awake, alert , oriented and patient cooperative  Airway & Oxygen Therapy: Patient Spontanous Breathing and Patient connected to nasal cannula oxygen  Post-op Assessment: Report given to RN and Post -op Vital signs reviewed and stable  Post vital signs: Reviewed and stable  Last Vitals:  Vitals Value Taken Time  BP    Temp    Pulse    Resp    SpO2      Last Pain:  Vitals:   01/23/19 0925  TempSrc: Oral  PainSc: 8       Patients Stated Pain Goal: 0 (33/38/32 9191)  Complications: No apparent anesthesia complications

## 2019-01-23 NOTE — H&P (View-Only) (Signed)
Reason for Consult:perineal pain Referring Physician: Ward, PA  Maureen Price is an 43 y.o. female.  HPI: 43 year old female with a history of MI, coronary artery disease, tobacco use, diabetes mellitus type 2 who started having some buttock and perirectal pain a few days ago.  This area started bothering her on Thursday.  She feels that the area has gotten worse.  She tried warm tub soaks without relief.  She has been having some chills but no fever.  She has not been able to pick up her prescription insulin for several days.  She has not taken a blood thinner in about a year she states.  No nausea or vomiting.  No chest pain or chest pressure.  She reports some foul-smelling drainage from the area.  No prior symptoms  Past Medical History:  Diagnosis Date   Acute myocardial infarction of other anterior wall, initial episode of care    Anginal pain ([place])    Anxiety    Arthritis    Asthma    CAD (coronary artery disease)    CHF (congestive heart failure) ([place])    Depression    Diabetes mellitus    2002; no meds since 2004   Dyslipidemia, goal LDL below 70 10/26/2013   Dysrhythmia    GERD (gastroesophageal reflux disease)    Headache(784.0)    Obesity    Paroxysmal ventricular tachycardia (HCC)    Shortness of breath    STEMI (ST elevation myocardial infarction) ([place])    Tobacco abuse     Past Surgical History:  Procedure Laterality Date   CESAREAN SECTION     CESAREAN SECTION  [phone]   CHOLECYSTECTOMY     CORONARY ANGIOPLASTY WITH STENT PLACEMENT     GALLBLADDER SURGERY     LEFT HEART CATH N/A 10/24/2013   Procedure: LEFT HEART CATH;  Surgeon: Lorretta Harp, MD;  [facility]: [facility] C[facility] LAB;  Service: Cardiovascular;  Laterality: N/A;   LEFT HEART CATHETERIZATION WITH CORONARY ANGIOGRAM N/A 10/26/2013   Procedure: LEFT HEART CATHETERIZATION WITH CORONARY ANGIOGRAM;  Surgeon: Burnell Blanks, MD;  Location: [facility] CATH LAB;  Service:  Cardiovascular;  Laterality: N/A;   LEFT HEART CATHETERIZATION WITH CORONARY ANGIOGRAM N/A 03/29/2014   Procedure: LEFT HEART CATHETERIZATION WITH CORONARY ANGIOGRAM;  Surgeon: Lorretta Harp, MD;  Location: [facility] CATH LAB;  Service: Cardiovascular;  Laterality: N/A;   TUBAL LIGATION     2000    Family History  Problem Relation Age of Onset   Arthritis Mother    Diabetes Mother    Heart disease Father    Stroke Father    Diabetes Maternal Grandfather    Cancer Neg Hx    Alcohol abuse Neg Hx    Early death Neg Hx    Kidney disease Neg Hx    Hypertension Neg Hx    Hyperlipidemia Neg Hx    Thyroid disease Neg Hx     Social History:  reports that she has been smoking cigarettes. She has a 10.00 pack-year smoking history. She has never used smokeless tobacco. She reports that she does not drink alcohol or use drugs.  Allergies:  Allergies  Allergen Reactions   Erythromycin Anaphylaxis   Penicillins Anaphylaxis   Metformin And Related Other (See Comments)    Diarrhea, upset stomach   Viibryd [Vilazodone Hcl] Other (See Comments)    Altered mental status.     Bee Venom Swelling    Throat swells    Medications: I have reviewed the patient's current  medications.  Results for orders placed or performed during the hospital encounter of 01/22/19 (from the past 48 hour(s))  CBC with Differential     Status: Abnormal   Collection Time: 01/22/19  8:37 PM  Result Value Ref Range   WBC 23.4 (H) 4.0 - 10.5 K/uL   RBC 4.83 3.87 - 5.11 MIL/uL   Hemoglobin 13.3 12.0 - 15.0 g/dL   HCT 38.7 36.0 - 46.0 %   MCV 80.1 80.0 - 100.0 fL   MCH 27.5 26.0 - 34.0 pg   MCHC 34.4 30.0 - 36.0 g/dL   RDW 12.9 11.5 - 15.5 %   Platelets 251 150 - 400 K/uL   nRBC 0.0 0.0 - 0.2 %   Neutrophils Relative % 84 %   Neutro Abs 19.6 (H) 1.7 - 7.7 K/uL   Lymphocytes Relative 8 %   Lymphs Abs 1.9 0.7 - 4.0 K/uL   Monocytes Relative 7 %   Monocytes Absolute 1.6 (H) 0.1 - 1.0 K/uL    Eosinophils Relative 0 %   Eosinophils Absolute 0.0 0.0 - 0.5 K/uL   Basophils Relative 0 %   Basophils Absolute 0.0 0.0 - 0.1 K/uL   Immature Granulocytes 1 %   Abs Immature Granulocytes 0.20 (H) 0.00 - 0.07 K/uL    Comment: Performed at [facility], [address] [place], [place] [address]  Comprehensive metabolic panel     Status: Abnormal   Collection Time: 01/22/19  8:37 PM  Result Value Ref Range   Sodium 127 (L) 135 - 145 mmol/L   Potassium 3.3 (L) 3.5 - 5.1 mmol/L   Chloride 93 (L) 98 - 111 mmol/L   CO2 20 (L) 22 - 32 mmol/L   Glucose, Bld 460 (H) 70 - 99 mg/dL   BUN 6 6 - 20 mg/dL   Creatinine, Ser 0.70 0.44 - 1.00 mg/dL   Calcium 8.6 (L) 8.9 - 10.3 mg/dL   Total Protein 6.9 6.5 - 8.1 g/dL   Albumin 2.7 (L) 3.5 - 5.0 g/dL   AST 18 15 - 41 U/L   ALT 23 0 - 44 U/L   Alkaline Phosphatase 134 (H) 38 - 126 U/L   Total Bilirubin 0.8 0.3 - 1.2 mg/dL   GFR calc non Af Amer >60 >60 mL/min   GFR calc Af Amer >60 >60 mL/min   Anion gap 14 5 - 15    Comment: Performed at [facility], [place] [address], [place], [place] [address]  Lactic acid, plasma     Status: None   Collection Time: 01/22/19  8:37 PM  Result Value Ref Range   Lactic Acid, Venous 1.9 0.5 - 1.9 mmol/L    Comment: Performed at [facility] [address], [place], [place] [address]  I-Stat beta hCG blood, ED     Status: None   Collection Time: 01/22/19  9:21 PM  Result Value Ref Range   I-stat hCG, quantitative <5.0 <5 mIU/mL   Comment 3            Comment:   GEST. AGE      CONC.  (mIU/mL)   <=1 WEEK        5 - 50     2 WEEKS       50 - 500     3 WEEKS       100 - 10,000     4 WEEKS     1,000 - 30,000  FEMALE AND NON-PREGNANT FEMALE:     LESS THAN 5 mIU/mL   Lactic acid, plasma     Status: None   Collection Time: 01/22/19 10:22 PM  Result Value Ref Range   Lactic Acid, Venous 1.2 0.5 - 1.9 mmol/L    Comment: Performed at [facility], [place] [address], [place], [place]  [address]    Ct Abdomen Pelvis W Contrast  Result Date: 01/22/2019 CLINICAL DATA:  Perirectal pain and swelling EXAM: CT ABDOMEN AND PELVIS WITH CONTRAST TECHNIQUE: Multidetector CT imaging of the abdomen and pelvis was performed using the standard protocol following bolus administration of intravenous contrast. CONTRAST:  145mL OMNIPAQUE IOHEXOL 300 MG/ML  SOLN COMPARISON:  None. FINDINGS: Lower chest: No acute abnormality. Hepatobiliary: No focal liver abnormality is seen. Status post cholecystectomy. No biliary dilatation. Pancreas: Unremarkable. No pancreatic ductal dilatation or surrounding inflammatory changes. Spleen: Normal in size without focal abnormality. Adrenals/Urinary Tract: Adrenal glands are unremarkable. Kidneys are normal, without renal calculi, focal lesion, or hydronephrosis. Bladder is unremarkable. Stomach/Bowel: Stomach is within normal limits. Appendix appears normal. No evidence of bowel wall thickening, distention, or inflammatory changes. Vascular/Lymphatic: Calcific atherosclerosis. No enlarged abdominal or pelvic lymph nodes. Reproductive: No mass or other abnormality. Other: No abdominal wall hernia or abnormality. No abdominopelvic ascites. There is subcutaneous emphysema in the soft tissues about the right aspect of the anus, perineum, and posterior right vulva (series 12, image 5, series 14, image 73). No discrete fluid collections appreciated. Musculoskeletal: No acute or significant osseous findings. IMPRESSION: There is subcutaneous emphysema in the soft tissues about the right aspect of the anus, perineum, and posterior right vulva (series 12, image 5, series 14, image 73). No discrete fluid collections appreciated. Findings are concerning for gas-forming infection (Fournier's gangrene). Recommend surgical consultation. Electronically Signed   By: Eddie Candle M.D.   On: 01/22/2019 23:14    Review of Systems  All other systems reviewed and are negative.  Blood pressure  134/77, pulse (!) 106, temperature 99.6 F (37.6 C), temperature source Oral, resp. rate (!) 23, height 5' 5.5" (1.664 m), weight 97.5 kg, last menstrual period 12/29/2018, SpO2 97 %. Physical Exam  Vitals reviewed. Constitutional: She is oriented to person, place, and time. She appears well-developed and well-nourished. No distress.  Appears a little uncomfortable; hospitalist and nurse at [facility]:  Head: Normocephalic and atraumatic.  Right Ear: External ear normal.  Left Ear: External ear normal.  Eyes: Conjunctivae are normal. No scleral icterus.  Neck: Normal range of motion. Neck supple. No tracheal deviation present. No thyromegaly present.  Cardiovascular: Normal rate and normal heart sounds.  Respiratory: Effort normal and breath sounds normal. No stridor. No respiratory distress. She has no wheezes.  GI: Soft. She exhibits no distension. There is no abdominal tenderness. There is no rebound.  Old c/s scar  Genitourinary:       Genitourinary Comments: Extensive induration Rt perineum extending to labia with cellulitis; very ttp. No obvious crepitus. Exam limited to severe pain.    Musculoskeletal:        General: No tenderness or edema.  Lymphadenopathy:    She has no cervical adenopathy.  Neurological: She is alert and oriented to person, place, and time. She exhibits normal muscle tone.  Skin: Skin is warm and dry. No rash noted. She is not diaphoretic. There is erythema. No pallor.  Psychiatric: She has a normal mood and affect. Her behavior is normal. Judgment and thought content normal.  Assessment/Plan: Fournier's gangrene Uncontrolled diabetes mellitus type 2 Hyponatremia Hypokalemia History of coronary artery disease History of CHF History of dyslipidemia  Patient has extensive induration and extreme tenderness to palpation in the right perineal area extending to her labia.  There is gas in the soft tissue on CT imaging.  I have recommended to the patient  that we go to the operating room this evening for incision, drainage and debridement of this area.  I explained to her that I do not know the extent of the infection that we needed to remove all nonviable tissue.  We discussed that she may require more than 1 trip to the operating room.  We discussed that this would result in a large wound that would have to heal by secondary intention.  We discussed this require daily dressing changes.  We discussed that proper management of her diabetes would be essential to promote healing.  I discussed the typical postoperative course.  Also discussed risk and benefits including but not limited to bleeding, infection, blood clot formation, scarring, injury to surrounding structures, need for additional procedures, perioperative cardiac and pulmonary events, pain  Admit to medical service for management of her other comorbidities IV antibiotics We will get wound cultures in [place]. Redmond Pulling, MD, FACS General, Bariatric, & Minimally Invasive Surgery [facility]   Greer Pickerel 01/23/2019, 12:37 AM

## 2019-01-23 NOTE — Progress Notes (Signed)
Inpatient Diabetes Program Recommendations  AACE/ADA: New Consensus Statement on Inpatient Glycemic Control (2015)  Target Ranges:  Prepandial:   less than 140 mg/dL      Peak postprandial:   less than 180 mg/dL (1-2 hours)      Critically ill patients:  140 - 180 mg/dL   Lab Results  Component Value Date   GLUCAP 120 (H) 01/23/2019   HGBA1C 10.5 (A) 02/28/2018    Review of Glycemic Control  Diabetes history: DM2 Outpatient Diabetes medications: Tresiba 50 units daily + Bydureon q week + Farxiga 10 mg qd Current orders for Inpatient glycemic control: Lantus 36 units + Novolog sensitive + hs 0-5  Inpatient Diabetes Program Recommendations:    Noted last A1c 10.5 in 02-28-18. Noted patient had not taken insulin a few days prior to admission. -A1c to determine current prehospital glycemic control. Attempted to contact patient by phone (DM coordinator working remotely) but unable to get to speak to patient. Will reattempt.  Thank you, Nani Gasser. Gracy Ehly, RN, MSN, CDE  Diabetes Coordinator Inpatient Glycemic Control Team Team Pager [phone] (8am-5pm) 01/23/2019 10:03 AM

## 2019-01-23 NOTE — Anesthesia Preprocedure Evaluation (Addendum)
Anesthesia Evaluation  Patient identified by MRN, date of birth, ID band Patient awake    Reviewed: Allergy & Precautions, NPO status , Patient's Chart, lab work & pertinent test results  Airway Mallampati: II  TM Distance: >3 FB     Dental  (+) Dental Advisory Given,    Pulmonary asthma , Current Smoker,    breath sounds clear to auscultation       Cardiovascular + angina + CAD, + Past MI, + Cardiac Stents, + Peripheral Vascular Disease and +CHF  + dysrhythmias  Rhythm:Regular Rate:Normal     Neuro/Psych negative neurological ROS     GI/Hepatic Neg liver ROS, GERD  ,  Endo/Other  diabetes, Type 2, Insulin DependentMorbid obesity  Renal/GU Renal disease     Musculoskeletal  (+) Arthritis ,   Abdominal   Peds  Hematology negative hematology ROS (+)   Anesthesia Other Findings   Reproductive/Obstetrics                            Lab Results  Component Value Date   WBC 23.4 (H) 01/22/2019   HGB 13.3 01/22/2019   HCT 38.7 01/22/2019   MCV 80.1 01/22/2019   PLT 251 01/22/2019   Lab Results  Component Value Date   CREATININE 0.70 01/22/2019   BUN 6 01/22/2019   NA 127 (L) 01/22/2019   K 3.3 (L) 01/22/2019   CL 93 (L) 01/22/2019   CO2 20 (L) 01/22/2019    Anesthesia Physical Anesthesia Plan  ASA: IV and emergent  Anesthesia Plan: General   Post-op Pain Management:    Induction: Intravenous and Rapid sequence  PONV Risk Score and Plan: 2 and Dexamethasone, Ondansetron and Treatment may vary due to age or medical condition  Airway Management Planned: Oral ETT  Additional Equipment:   Intra-op Plan:   Post-operative Plan: Extubation in OR and Possible Post-op intubation/ventilation  Informed Consent: I have reviewed the patients History and Physical, chart, labs and discussed the procedure including the risks, benefits and alternatives for the proposed anesthesia with the  patient or authorized representative who has indicated his/her understanding and acceptance.     Dental advisory given  Plan Discussed with: CRNA  Anesthesia Plan Comments:         Anesthesia Quick Evaluation

## 2019-01-23 NOTE — Plan of Care (Signed)
  Problem: Clinical Measurements: Goal: Ability to maintain clinical measurements within normal limits will improve Outcome: Progressing   Problem: Clinical Measurements: Goal: Ability to maintain clinical measurements within normal limits will improve Outcome: Progressing   Problem: Pain Managment: Goal: General experience of comfort will improve Outcome: Progressing   Problem: Safety: Goal: Ability to remain free from injury will improve Outcome: Progressing

## 2019-01-23 NOTE — Progress Notes (Signed)
Maureen Price [facility]  PROGRESS NOTE    Maureen Price  VZD:[id number] DOB: [birth year] DOA: 01/22/2019 PCP: [name], MD   Brief Narrative:   Maureen Price is a 43 y.o. female with medical history significant of poorly controlled diabetes, medication noncompliance, asthma, GERD, depression, anxiety, CAD, stent placement, tobacco abuse, obesity, who presents with pain in buttock and fever.  Pt states that she initially noticed painful boils on her buttocks and vaginal lip on Thursday. She attempted to do an epsom salt bath to relieve pain without help. The pain has been progressively worsening, the area has been enlarging. The pain is constant, sharp, 10 out of 10 in severity, nonradiating. She states that the boil self drainage once without improvement of pain. She developed fever and chills today.  Patient states that she has a mild dry cough due to smoking, but no chest pain, shortness of breath.  She does not have nausea vomiting, diarrhea or abdominal pain.  She has some dysuria and burning on urination.   Assessment & Plan:   Principal Problem:   Fournier gangrene Active Problems:   Uncontrolled diabetes mellitus type 2 with atherosclerosis of arteries of extremities (HCC)   Obesity   Coronary artery disease   Tobacco use disorder   Asthma   Anxiety   Sepsis ([place])   Hypokalemia   COVID-19 virus infection   Sepsis due to Fournier gangrene:      - Patient meets criteria for sepsis with leukocytosis, fever, tachycardia and tachypnea.        - Lactic acid normal, 1.9, 1.2.       - General surgery consutled, appreciate assistance, now s/p I&D     - continue vanc, clinda, aztreonam     - Bld Cx pending     - fluids     - percocet, dilaudid PRN   Uncontrolled diabetes mellitus type 2 with atherosclerosis of arteries of extremities ([place]):      - Patient has not been compliant to taking insulin and diabetic medications.       - Blood sugar 460, anion gap 14. Last A1c 10.5, poorly  controled.      - Patient is supposed to take [place] 50 units daily, [place], [place], but taking any of them.     - start T[place] 35 units daily     - SSI     - check A1c  Morbid obesity:      - Diet and exercise.      - Encouraged to lose weight.  Coronary artery disease: s/p of sent.     - restart ASA     - hold plavix due to surgery     - prn NTG  Tobacco use disorder:     - Did counseling about importance of quitting smoking     - Nicotine patch  Anxiety:     - Continue home Xaxax  Hypokalemia:     - repleted  Asthma: stable     - prn albuterol inhaler and mucinex  COVID positive     - no resp sx     - trend CRP, ferritin, d-dimer   DVT prophylaxis: SCDs Code Status: FULL   Disposition Plan: TBD   Consultants:   General Surgery  Procedures:   I&D of Perirectal Abscess  Antimicrobials:  Deniece Ree, cleocein, aztreonam    Subjective: "I'm breathing fine."  Objective: Vitals:   01/23/19 0430 01/23/19 0529 01/23/19 0925 01/23/19 1500  BP:   116/62 106/65  Pulse:   99   Resp:   (!) 24   Temp:  99.8 F (37.7 C) 98.7 F (37.1 C) 99.3 F (37.4 C)  TempSrc:   Oral Oral  SpO2:   99%   Weight: 95.2 kg     Height:        Intake/Output Summary (Last 24 hours) at 01/23/2019 1623 Last data filed at 01/23/2019 1337 Gross per 24 hour  Intake 2810 ml  Output 2 ml  Net 2808 ml   Filed Weights   01/22/19 2009 01/23/19 0430  Weight: 97.5 kg 95.2 kg    Examination:  General: 42 y.o. female resting in bed in NAD Cardiovascular: RRR, +S1, S2, no m/g/r, equal pulses throughout Respiratory: CTABL, no w/r/r, normal WOB GI: BS+, NDNT, no masses noted, no organomegaly noted MSK: No e/c/c Skin: No rashes, bruises, ulcerations noted Neuro: A&O x 3, no focal deficits Psyc: Appropriate interaction and affect, calm/cooperative     Data Reviewed: I have personally reviewed following labs and imaging studies.  CBC: Recent Labs  Lab 01/22/19  2037 01/23/19 0513  WBC 23.4* 25.8*  NEUTROABS 19.6*  --   HGB 13.3 12.1  HCT 38.7 34.1*  MCV 80.1 78.4*  PLT 251 735   Basic Metabolic Panel: Recent Labs  Lab 01/22/19 2037 01/23/19 0513 01/23/19 1436  NA 127* 136 136  K 3.3* 3.0* 3.4*  CL 93* 103 104  CO2 20* 20* 18*  GLUCOSE 460* 269* 259*  BUN 6 6 6   CREATININE 0.70 0.54 0.59  CALCIUM 8.6* 8.4* 8.3*  PHOS  --   --  3.4   GFR: Estimated Creatinine Clearance: 105.6 mL/min (by C-G formula based on SCr of 0.59 mg/dL). Liver Function Tests: Recent Labs  Lab 01/22/19 2037 01/23/19 1436  AST 18  --   ALT 23  --   ALKPHOS 134*  --   BILITOT 0.8  --   PROT 6.9  --   ALBUMIN 2.7* 2.1*   No results for input(s): LIPASE, AMYLASE in the last 168 hours. No results for input(s): AMMONIA in the last 168 hours. Coagulation Profile: Recent Labs  Lab 01/23/19 0019  INR 1.3*   Cardiac Enzymes: Recent Labs  Lab 01/23/19 0513  TROPONINI 0.03*   BNP (last 3 results) No results for input(s): PROBNP in the last 8760 hours. HbA1C: Recent Labs    01/23/19 1436  HGBA1C 11.3*   CBG: Recent Labs  Lab 01/23/19 0635 01/23/19 0728 01/23/19 0843 01/23/19 1002 01/23/19 1412  GLUCAP 206* 161* 120* 157* 242*   Lipid Profile: Recent Labs    01/23/19 0513  TRIG 92   Thyroid Function Tests: No results for input(s): TSH, T4TOTAL, FREET4, T3FREE, THYROIDAB in the last 72 hours. Anemia Panel: Recent Labs    01/23/19 0513  FERRITIN 206   Sepsis Labs: Recent Labs  Lab 01/22/19 2037 01/22/19 2222 01/23/19 0019  PROCALCITON  --   --  0.24  LATICACIDVEN 1.9 1.2  --     Recent Results (from the past 240 hour(s))  Culture, blood (routine x 2)     Status: None (Preliminary result)   Collection Time: 01/22/19  8:40 PM  Result Value Ref Range Status   Specimen Description BLOOD LEFT HAND  Final   Special Requests   Final    BOTTLES DRAWN AEROBIC AND ANAEROBIC Blood Culture adequate volume   Culture   Final    NO  GROWTH < 24 HOURS Performed at [facility], [address], [place], [place] [address]    Report Status PENDING  Incomplete  Culture, blood (routine x 2)     Status: None (Preliminary result)   Collection Time: 01/22/19  8:58 PM  Result Value Ref Range Status   Specimen Description BLOOD RIGHT ANTECUBITAL  Final   Special Requests   Final    BOTTLES DRAWN AEROBIC ONLY Blood Culture adequate volume   Culture   Final    NO GROWTH < 24 HOURS Performed at [facility], [place] [address] [place], [place] [address]    Report Status PENDING  Incomplete  SARS Coronavirus 2 (CEPHEID - Performed in [place] hospital lab), Hosp Order     Status: Abnormal   Collection Time: 01/23/19  1:19 AM  Result Value Ref Range Status   SARS Coronavirus 2 POSITIVE (A) NEGATIVE Final    Comment: RESULT CALLED TO, READ BACK BY AND VERIFIED WITH: R FITZGERAKD ANSTI @0235  01/23/19 BY S GEZAHEGN (NOTE) If result is NEGATIVE SARS-CoV-2 target nucleic acids are NOT DETECTED. The SARS-CoV-2 RNA is generally detectable in upper and lower  respiratory specimens during the acute phase of infection. The lowest  concentration of SARS-CoV-2 viral copies this assay can detect is 250  copies / mL. A negative result does not preclude SARS-CoV-2 infection  and should not be used as the sole basis for treatment or other  patient management decisions.  A negative result may occur with  improper specimen collection / handling, submission of specimen other  than nasopharyngeal swab, presence of viral mutation(s) within the  areas targeted by this assay, and inadequate number of viral copies  (<250 copies / mL). A negative result must be combined with clinical  observations, patient history, and epidemiological information. If result is POSITIVE SARS-CoV-2 target nucleic acids are D ETECTED. The SARS-CoV-2 RNA is generally detectable in upper and lower  respiratory specimens during the acute phase of infection.   Positive  results are indicative of active infection with SARS-CoV-2.  Clinical  correlation with patient history and other diagnostic information is  necessary to determine patient infection status.  Positive results do  not rule out bacterial infection or co-infection with other viruses. If result is PRESUMPTIVE POSTIVE SARS-CoV-2 nucleic acids MAY BE PRESENT.   A presumptive positive result was obtained on the submitted specimen  and confirmed on repeat testing.  While 2019 novel coronavirus  (SARS-CoV-2) nucleic acids may be present in the submitted sample  additional confirmatory testing may be necessary for epidemiological  and / or clinical management purposes  to differentiate between  SARS-CoV-2 and other [place]ovirus currently known to infect humans.  If clinically indicated additional testing with an alternate test  methodology [phone]) is advised. The SARS-CoV-2 RNA is generally  detectable in upper and lower respiratory specimens during the acute  phase of infection. The expected result is Negative. Fact Sheet for Patients:  StrictlyIdeas.no Fact Sheet for Healthcare Providers: BankingDealers.co.za This test is not yet approved or cleared by the [place] FDA and has been authorized for detection and/or diagnosis of SARS-CoV-2 by FDA under an Emergency Use Authorization (EUA).  This EUA will remain in effect (meaning this test can be used) for the duration of the COVID-19 declaration under Section 564(b)(1) of the Act, 21 U.S.C. section 360bbb-3(b)(1), unless the authorization is terminated or revoked sooner. Performed at [facility], [place] [address] [place], [place] [address]          Radiology Studies: Ct  Abdomen Pelvis W Contrast  Result Date: 01/22/2019 CLINICAL DATA:  Perirectal pain and swelling EXAM: CT ABDOMEN AND PELVIS WITH CONTRAST TECHNIQUE: Multidetector CT imaging of the abdomen and pelvis  was performed using the standard protocol following bolus administration of intravenous contrast. CONTRAST:  158mL OMNIPAQUE IOHEXOL 300 MG/ML  SOLN COMPARISON:  None. FINDINGS: Lower chest: No acute abnormality. Hepatobiliary: No focal liver abnormality is seen. Status post cholecystectomy. No biliary dilatation. Pancreas: Unremarkable. No pancreatic ductal dilatation or surrounding inflammatory changes. Spleen: Normal in size without focal abnormality. Adrenals/Urinary Tract: Adrenal glands are unremarkable. Kidneys are normal, without renal calculi, focal lesion, or hydronephrosis. Bladder is unremarkable. Stomach/Bowel: Stomach is within normal limits. Appendix appears normal. No evidence of bowel wall thickening, distention, or inflammatory changes. Vascular/Lymphatic: Calcific atherosclerosis. No enlarged abdominal or pelvic lymph nodes. Reproductive: No mass or other abnormality. Other: No abdominal wall hernia or abnormality. No abdominopelvic ascites. There is subcutaneous emphysema in the soft tissues about the right aspect of the anus, perineum, and posterior right vulva (series 12, image 5, series 14, image 73). No discrete fluid collections appreciated. Musculoskeletal: No acute or significant osseous findings. IMPRESSION: There is subcutaneous emphysema in the soft tissues about the right aspect of the anus, perineum, and posterior right vulva (series 12, image 5, series 14, image 73). No discrete fluid collections appreciated. Findings are concerning for gas-forming infection (Fournier's gangrene). Recommend surgical consultation. Electronically Signed   By: Eddie Candle M.D.   On: 01/22/2019 23:14   Dg Chest Port 1 View  Result Date: 01/23/2019 CLINICAL DATA:  Fevers and positive COVID-19 EXAM: PORTABLE CHEST 1 VIEW COMPARISON:  09/19/2016 FINDINGS: Cardiac shadows within normal limits. The overall inspiratory effort is poor. Some patchy increased density is noted in the right upper lobe which  could be related to mild ground-glass infiltrate. No sizable effusion is seen. No bony abnormality is noted. IMPRESSION: Patchy right upper lobe infiltrate. Electronically Signed   By: Inez Catalina M.D.   On: 01/23/2019 07:22        Scheduled Meds: . aspirin EC  81 mg Oral Daily  . calcium carbonate  1 tablet Oral Daily  . insulin aspart  0-5 Units Subcutaneous QHS  . insulin aspart  0-9 Units Subcutaneous TID WC  . insulin glargine  36 Units Subcutaneous Daily  . insulin   Intravenous To OR  . multivitamin with minerals  1 tablet Oral Daily  . nicotine  21 mg Transdermal Daily  . omega-3 acid ethyl esters  1 g Oral Daily  . vitamin B-12  2,000 mcg Oral Daily  . vitamin C  1,000 mg Oral Daily   Continuous Infusions: . sodium chloride 100 mL/hr at 01/23/19 1000  . aztreonam 2 g (01/23/19 0529)  . clindamycin (CLEOCIN) IV 900 mg (01/23/19 1456)  . vancomycin       LOS: 0 days    Time spent: 35 minutes spent in the coordination of care today.    Jonnie Finner, DO [facility] Pager [phone]  If 7PM-7AM, please contact night-coverage [email or web address] Password [facility] 01/23/2019, 4:23 PM

## 2019-01-23 NOTE — Progress Notes (Addendum)
The pt's covid test came back positive.  I have informed the hospitalist  Discussed with anesthesia, Dr Ola Spurr.    Her positive coronavirus test changes the sequence of events with respect to surgery  She is currently hemodynamically stable.  She is normotensive.  Therefore I do not believe she needs to go emergently to the operating room at 3 AM since we now have a positive coronavirus result.  This needs to be a coordinated case with anesthesia, OR personnel, infection control staff monitoring OR personnel for appropriate use and donning/doffing of PPE. Moreover pt would need to be extubated and recovered in the OR. Therefore I believe this case needs to wait to later in the morning when more staff is available for better staff safety. Dr. Ola Spurr is in agreement  We will plan OR later this morning in the COVID OR with the appropriate safety precautions  Maureen Price. Redmond Pulling, MD, FACS General, Bariatric, & Minimally Invasive Surgery [facility], [place]

## 2019-01-23 NOTE — Anesthesia Postprocedure Evaluation (Signed)
Anesthesia Post Note  Patient: BOWEN GOYAL  Procedure(s) Performed: IRRIGATION AND DEBRIDEMENT PERIRECTAL ABSCESS (N/A Perineum)     Patient location during evaluation: PACU Anesthesia Type: General Level of consciousness: sedated Pain management: pain level controlled Vital Signs Assessment: post-procedure vital signs reviewed and stable Respiratory status: spontaneous breathing and respiratory function stable Cardiovascular status: stable Postop Assessment: no apparent nausea or vomiting Anesthetic complications: no    Last Vitals:  Vitals:   01/23/19 0529 01/23/19 0925  BP:  116/62  Pulse:  99  Resp:  (!) 24  Temp: 37.7 C 37.1 C  SpO2:  99%    Last Pain:  Vitals:   01/23/19 0925  TempSrc: Oral  PainSc: 8                  Nikoli Nasser DANIEL

## 2019-01-23 NOTE — ED Notes (Signed)
ED TO INPATIENT HANDOFF REPORT  ED Nurse Name and Phone #:  Patty [phone]  S Name/Age/Gender Maureen Price 43 y.o. female Room/Bed: 009C/009C  Code Status   Code Status: Prior  Home/SNF/Other Home Patient oriented to: self, place, time and situation Is this baseline? Yes   Triage Complete: Triage complete  Chief Complaint Boil on buttocks  Triage Note Patient noticed a boil on her buttocks and vaginal lip on Thursday. She attempted to do an epsom salt bath to relieve pain but it did not help. She has lost her appetite. She has been having cold/hot spells.    Allergies Allergies  Allergen Reactions  . Erythromycin Anaphylaxis  . Penicillins Anaphylaxis  . Metformin And Related Other (See Comments)    Diarrhea, upset stomach  . Viibryd [Vilazodone Hcl] Other (See Comments)    Altered mental status.    . Bee Venom Swelling    Throat swells    Level of Care/Admitting Diagnosis ED Disposition    ED Disposition Condition Comment   Admit  The patient appears reasonably stabilized for admission considering the current resources, flow, and capabilities available in the ED at this time, and I doubt any other [facility] requiring further screening and/or treatment in the ED prior to admission is  present.       B Medical/Surgery History Past Medical History:  Diagnosis Date  . Acute myocardial infarction of other anterior wall, initial episode of care   . Anginal pain ([place])   . Anxiety   . Arthritis   . Asthma   . CAD (coronary artery disease)   . CHF (congestive heart failure) ([place])   . Depression   . Diabetes mellitus    2002; no meds since 2004  . Dyslipidemia, goal LDL below 70 10/26/2013  . Dysrhythmia   . GERD (gastroesophageal reflux disease)   . Headache(784.0)   . Obesity   . Paroxysmal ventricular tachycardia ([place])   . Shortness of breath   . STEMI (ST elevation myocardial infarction) ([place])   . Tobacco abuse    Past Surgical History:  Procedure  Laterality Date  . CESAREAN SECTION    . CESAREAN SECTION  1994,1997,2000  . CHOLECYSTECTOMY    . CORONARY ANGIOPLASTY WITH STENT PLACEMENT    . GALLBLADDER SURGERY    . LEFT HEART CATH N/A 10/24/2013   Procedure: LEFT HEART CATH;  Surgeon: Lorretta Harp, MD;  Location: [facility] CATH LAB;  Service: Cardiovascular;  Laterality: N/A;  . LEFT HEART CATHETERIZATION WITH CORONARY ANGIOGRAM N/A 10/26/2013   Procedure: LEFT HEART CATHETERIZATION WITH CORONARY ANGIOGRAM;  Surgeon: Burnell Blanks, MD;  Location: [facility] CATH LAB;  Service: Cardiovascular;  Laterality: N/A;  . LEFT HEART CATHETERIZATION WITH CORONARY ANGIOGRAM N/A 03/29/2014   Procedure: LEFT HEART CATHETERIZATION WITH CORONARY ANGIOGRAM;  Surgeon: Lorretta Harp, MD;  Location: [facility] CATH LAB;  Service: Cardiovascular;  Laterality: N/A;  . TUBAL LIGATION     2000     A IV Location/Drains/Wounds Patient Lines/Drains/Airways Status   Active Line/Drains/Airways    Name:   Placement date:   Placement time:   Site:   Days:   Peripheral IV 01/22/19 Right Antecubital   01/22/19    2102    Antecubital   1          Intake/Output Last 24 hours No intake or output data in the 24 hours ending 01/23/19 0020  Labs/Imaging Results for orders placed or performed during the hospital encounter of 01/22/19 (from the past  48 hour(s))  CBC with Differential     Status: Abnormal   Collection Time: 01/22/19  8:37 PM  Result Value Ref Range   WBC 23.4 (H) 4.0 - 10.5 K/uL   RBC 4.83 3.87 - 5.11 MIL/uL   Hemoglobin 13.3 12.0 - 15.0 g/dL   HCT 38.7 36.0 - 46.0 %   MCV 80.1 80.0 - 100.0 fL   MCH 27.5 26.0 - 34.0 pg   MCHC 34.4 30.0 - 36.0 g/dL   RDW 12.9 11.5 - 15.5 %   Platelets 251 150 - 400 K/uL   nRBC 0.0 0.0 - 0.2 %   Neutrophils Relative % 84 %   Neutro Abs 19.6 (H) 1.7 - 7.7 K/uL   Lymphocytes Relative 8 %   Lymphs Abs 1.9 0.7 - 4.0 K/uL   Monocytes Relative 7 %   Monocytes Absolute 1.6 (H) 0.1 - 1.0 K/uL   Eosinophils Relative 0 %    Eosinophils Absolute 0.0 0.0 - 0.5 K/uL   Basophils Relative 0 %   Basophils Absolute 0.0 0.0 - 0.1 K/uL   Immature Granulocytes 1 %   Abs Immature Granulocytes 0.20 (H) 0.00 - 0.07 K/uL    Comment: Performed at [facility] [address], [place], [place] [address]  Comprehensive metabolic panel     Status: Abnormal   Collection Time: 01/22/19  8:37 PM  Result Value Ref Range   Sodium 127 (L) 135 - 145 mmol/L   Potassium 3.3 (L) 3.5 - 5.1 mmol/L   Chloride 93 (L) 98 - 111 mmol/L   CO2 20 (L) 22 - 32 mmol/L   Glucose, Bld 460 (H) 70 - 99 mg/dL   BUN 6 6 - 20 mg/dL   Creatinine, Ser 0.70 0.44 - 1.00 mg/dL   Calcium 8.6 (L) 8.9 - 10.3 mg/dL   Total Protein 6.9 6.5 - 8.1 g/dL   Albumin 2.7 (L) 3.5 - 5.0 g/dL   AST 18 15 - 41 U/L   ALT 23 0 - 44 U/L   Alkaline Phosphatase 134 (H) 38 - 126 U/L   Total Bilirubin 0.8 0.3 - 1.2 mg/dL   GFR calc non Af Amer >60 >60 mL/min   GFR calc Af Amer >60 >60 mL/min   Anion gap 14 5 - 15    Comment: Performed at [facility], [place] [address], [place], [place] [address]  Lactic acid, plasma     Status: None   Collection Time: 01/22/19  8:37 PM  Result Value Ref Range   Lactic Acid, Venous 1.9 0.5 - 1.9 mmol/L    Comment: Performed at [facility] [address], [place], [place] [address]  I-Stat beta hCG blood, ED     Status: None   Collection Time: 01/22/19  9:21 PM  Result Value Ref Range   I-stat hCG, quantitative <5.0 <5 mIU/mL   Comment 3            Comment:   GEST. AGE      CONC.  (mIU/mL)   <=1 WEEK        5 - 50     2 WEEKS       50 - 500     3 WEEKS       100 - 10,000     4 WEEKS     1,000 - 30,000        FEMALE AND NON-PREGNANT FEMALE:     LESS THAN 5 mIU/mL  Lactic acid, plasma     Status: None   Collection Time: 01/22/19 10:22 PM  Result Value Ref Range   Lactic Acid, Venous 1.2 0.5 - 1.9 mmol/L    Comment: Performed at [facility], [place] [address], [place], [place] [address]   Ct Abdomen Pelvis  W Contrast  Result Date: 01/22/2019 CLINICAL DATA:  Perirectal pain and swelling EXAM: CT ABDOMEN AND PELVIS WITH CONTRAST TECHNIQUE: Multidetector CT imaging of the abdomen and pelvis was performed using the standard protocol following bolus administration of intravenous contrast. CONTRAST:  126mL OMNIPAQUE IOHEXOL 300 MG/ML  SOLN COMPARISON:  None. FINDINGS: Lower chest: No acute abnormality. Hepatobiliary: No focal liver abnormality is seen. Status post cholecystectomy. No biliary dilatation. Pancreas: Unremarkable. No pancreatic ductal dilatation or surrounding inflammatory changes. Spleen: Normal in size without focal abnormality. Adrenals/Urinary Tract: Adrenal glands are unremarkable. Kidneys are normal, without renal calculi, focal lesion, or hydronephrosis. Bladder is unremarkable. Stomach/Bowel: Stomach is within normal limits. Appendix appears normal. No evidence of bowel wall thickening, distention, or inflammatory changes. Vascular/Lymphatic: Calcific atherosclerosis. No enlarged abdominal or pelvic lymph nodes. Reproductive: No mass or other abnormality. Other: No abdominal wall hernia or abnormality. No abdominopelvic ascites. There is subcutaneous emphysema in the soft tissues about the right aspect of the anus, perineum, and posterior right vulva (series 12, image 5, series 14, image 73). No discrete fluid collections appreciated. Musculoskeletal: No acute or significant osseous findings. IMPRESSION: There is subcutaneous emphysema in the soft tissues about the right aspect of the anus, perineum, and posterior right vulva (series 12, image 5, series 14, image 73). No discrete fluid collections appreciated. Findings are concerning for gas-forming infection (Fournier's gangrene). Recommend surgical consultation. Electronically Signed   By: Eddie Candle M.D.   On: 01/22/2019 23:14    Pending Labs Unresulted Labs (From admission, onward)    Start     Ordered   01/23/19 0500  Brain natriuretic  peptide  Tomorrow morning,   R     01/23/19 0009   01/23/19 0011  Protime-INR  Once,   R     01/23/19 0010   01/23/19 0011  APTT  Once,   R     01/23/19 0010   01/23/19 0011  Procalcitonin  ONCE - STAT,   R     01/23/19 0010   01/23/19 0010  Sedimentation rate  Once,   R     01/23/19 0009   01/23/19 0010  C-reactive protein  Once,   R     01/23/19 0009   01/22/19 2354  SARS Coronavirus 2 (CEPHEID - Performed in [facility] hospital lab), Hosp Order  (Asymptomatic Patients Labs)  Once,   R    Question:  Rule Out  Answer:  Yes   01/22/19 2353   01/22/19 2109  Culture, blood (routine x 2)  BLOOD CULTURE X 2,   STAT     01/22/19 2108          Vitals/Pain Today's Vitals   01/22/19 2147 01/22/19 2219 01/22/19 2230 01/22/19 2340  BP:  132/71 134/77   Pulse:  (!) 106 (!) 106   Resp:  (!) 25 (!) 23   Temp:  99.6 F (37.6 C)    TempSrc:  Oral    SpO2:  97% 97%   Weight:      Height:      PainSc: 5    10-Worst pain ever    Isolation Precautions No active isolations  Medications Medications  clindamycin (  CLEOCIN) IVPB 900 mg (900 mg Intravenous New Bag/Given 01/23/19 0020)  aztreonam (AZACTAM) 2 g in sodium chloride 0.9 % 100 mL IVPB (has no administration in time range)  vancomycin (VANCOCIN) 1,500 mg in sodium chloride 0.9 % 500 mL IVPB (has no administration in time range)  vancomycin (VANCOCIN) IVPB 1000 mg/200 mL premix (has no administration in time range)  HYDROmorphone (DILAUDID) injection 1 mg (has no administration in time range)  oxyCODONE-acetaminophen (PERCOCET/ROXICET) 5-325 MG per tablet 1 tablet (has no administration in time range)  potassium chloride SA (K-DUR) CR tablet 40 mEq (has no administration in time range)  nicotine (NICODERM CQ - dosed in mg/24 hours) patch 21 mg (has no administration in time range)  aspirin EC tablet 81 mg (has no administration in time range)  nitroGLYCERIN (NITROSTAT) SL tablet 0.4 mg (has no administration in time range)   ALPRAZolam (XANAX) tablet 0.5 mg (has no administration in time range)  Insulin Degludec SOPN 36 Units (has no administration in time range)  vitamin B-12 (CYANOCOBALAMIN) tablet 2,000 mcg (has no administration in time range)  Calcium TABS 150 mg (has no administration in time range)  Multi-Vitamin Daily TABS 1 tablet (has no administration in time range)  omega-3 acid ethyl esters (LOVAZA) capsule 1 g (has no administration in time range)  vitamin C (ASCORBIC ACID) tablet 1,000 mg (has no administration in time range)  insulin aspart (novoLOG) injection 0-9 Units (has no administration in time range)  insulin aspart (novoLOG) injection 0-5 Units (has no administration in time range)  morphine 4 MG/ML injection 4 mg (4 mg Intravenous Given 01/22/19 2109)  acetaminophen (TYLENOL) tablet 650 mg (650 mg Oral Given 01/22/19 2106)  iohexol (OMNIPAQUE) 300 MG/ML solution 100 mL (100 mLs Intravenous Contrast Given 01/22/19 2236)  HYDROmorphone (DILAUDID) injection 1 mg (1 mg Intravenous Given 01/22/19 2342)  sodium chloride 0.9 % bolus 1,000 mL (1,000 mLs Intravenous New Bag/Given 01/22/19 2342)    Mobility walks Low fall risk   Focused Assessments    R Recommendations: See Admitting Provider Note  Report given to:   Additional Notes:

## 2019-01-23 NOTE — Progress Notes (Signed)
Pt arrived from OR via bed by RNs. Pt A&OX3 in NAD. Pt VS taken, placed on tele. CBG take. And oriented to room. No complaints at present.

## 2019-01-23 NOTE — Anesthesia Procedure Notes (Signed)
Procedure Name: Intubation Date/Time: 01/23/2019 11:55 AM Performed by: Cleda Daub, CRNA Pre-anesthesia Checklist: Patient identified, Emergency Drugs available, Suction available and Patient being monitored Patient Re-evaluated:Patient Re-evaluated prior to induction Oxygen Delivery Method: Circle system utilized Preoxygenation: Pre-oxygenation with 100% oxygen Induction Type: IV induction Laryngoscope Size: Glidescope and 4 Grade View: Grade I Tube type: Oral Tube size: 7.0 mm Number of attempts: 1 Airway Equipment and Method: Stylet and Video-laryngoscopy Placement Confirmation: ETT inserted through vocal cords under direct vision,  positive ETCO2 and breath sounds checked- equal and bilateral Secured at: 21 cm Tube secured with: Tape Dental Injury: Teeth and Oropharynx as per pre-operative assessment

## 2019-01-23 NOTE — Op Note (Signed)
Preoperative diagnosis: necrotizing soft tissue infection perineum, buttock, SARS COV2 positive Postoperative diagnosis; same as above. Procedure: Incision, debridement of 10x10x8 cm area of skin, subcutaneous tissue Surgeon: Dr Serita Grammes Anesthesia: general EBL: 50 cc Complications none Drains none Specimens cx to micro, tissue for pathology Sponge and needle count correct dispo to recovery stable  Indications: This is a 23 yof who was admitted overnight with necrotizing soft tissue infection. She has mild cough and was found to have SARS COV2 positive swab. She did not go to OR last night because of this.  I saw her this am and discussed debridement with her and possible increased mortality given current coronavirus infection.    Procedure: After informed consent was obtained she was taken to the operating room.  She was already on antibiotics.  SCDs were in place.  Anesthesia then placed under general anesthesia without complication.  I then wore a PAPR device and entered the room.  She was then placed in lithotomy and appropriately padded.  She was then prepped and draped in the standard sterile surgical fashion.  A surgical timeout was then performed.  She had a large area extending from the right buttock onto the labia.  I made a large incision that I eventually extended from the labia and into the groin all the way down to the buttock.  I extended this and removed a fair amount of the skin as well as the right labia.  She had a lot of purulence that was cultured.  I removed all of the dead tissue which was skin and subcutaneous tissue measuring 10 x 10 x 8 cm.  This was done with a combination of cautery as well as scalpel dissection.  Once I had done this I then irrigated this.  Only healthy tissue was present at this point.  I feel like I cleared all of the infection.  I then packed this.  She was then awakened in the operating room and will recover there.  Excisional  debridement:  2.  Tool used for debridement (curette, scapel, etc.) ccalpel, kocher, cautery  3.  Frequency of surgical debridement.   First time  4.  Measurement of total devitalized tissue (wound surface) before and after surgical debridement.   10x8x8 cm  5.  Area and depth of devitalized tissue removed from wound.  Same as above  6.  Blood loss and description of tissue removed.  50 cc, necrotic  7.  Evidence of the progress of the wound's response to treatment.  A.  Current wound volume (current dimensions and depth).  10x8x8  B.  Presence (and extent of) of infection.  None further grossly  C.  Presence (and extent of) of non viable tissue.  None further grossly  D.  Other material in the wound that is expected to inhibit healing.  none  8.  Was there any viable tissue removed (measurements): no   I have attempted to call her daughter [name] who she asked me to call and there is no answer at that cell phone when I try to call her twice.

## 2019-01-23 NOTE — Anesthesia Preprocedure Evaluation (Signed)
Anesthesia Evaluation  Patient identified by MRN, date of birth, ID band Patient awake    Reviewed: Allergy & Precautions, NPO status , Patient's Chart, lab work & pertinent test results  History of Anesthesia Complications Negative for: history of anesthetic complications  Airway Mallampati: II  TM Distance: >3 FB     Dental  (+) Dental Advisory Given,    Pulmonary asthma , Current Smoker,    breath sounds clear to auscultation       Cardiovascular + angina + CAD, + Past MI, + Cardiac Stents, + Peripheral Vascular Disease and +CHF  + dysrhythmias  Rhythm:Regular Rate:Normal     Neuro/Psych PSYCHIATRIC DISORDERS Anxiety Depression negative neurological ROS     GI/Hepatic Neg liver ROS, GERD  ,  Endo/Other  diabetes, Type 2, Insulin DependentMorbid obesity  Renal/GU Renal disease     Musculoskeletal  (+) Arthritis ,   Abdominal   Peds  Hematology negative hematology ROS (+)   Anesthesia Other Findings   Reproductive/Obstetrics                             Lab Results  Component Value Date   WBC 25.8 (H) 01/23/2019   HGB 12.1 01/23/2019   HCT 34.1 (L) 01/23/2019   MCV 78.4 (L) 01/23/2019   PLT 254 01/23/2019   Lab Results  Component Value Date   CREATININE 0.54 01/23/2019   BUN 6 01/23/2019   NA 136 01/23/2019   K 3.0 (L) 01/23/2019   CL 103 01/23/2019   CO2 20 (L) 01/23/2019    Anesthesia Physical  Anesthesia Plan  ASA: IV and emergent  Anesthesia Plan: General   Post-op Pain Management:    Induction: Intravenous and Rapid sequence  PONV Risk Score and Plan: 3 and Dexamethasone, Ondansetron, Treatment may vary due to age or medical condition and Scopolamine patch - Pre-op  Airway Management Planned: Oral ETT  Additional Equipment:   Intra-op Plan:   Post-operative Plan: Extubation in OR and Possible Post-op intubation/ventilation  Informed Consent: I have  reviewed the patients History and Physical, chart, labs and discussed the procedure including the risks, benefits and alternatives for the proposed anesthesia with the patient or authorized representative who has indicated his/her understanding and acceptance.     Dental advisory given  Plan Discussed with: CRNA and Anesthesiologist  Anesthesia Plan Comments:         Anesthesia Quick Evaluation

## 2019-01-24 ENCOUNTER — Encounter (HOSPITAL_COMMUNITY): Payer: Self-pay | Admitting: General Surgery

## 2019-01-24 ENCOUNTER — Other Ambulatory Visit: Payer: Self-pay | Admitting: Internal Medicine

## 2019-01-24 LAB — GLUCOSE 6 PHOSPHATE DEHYDROGENASE
G6PDH: 12.7 U/g{Hb} (ref 4.6–13.5)
Hemoglobin: 12.3 g/dL (ref 11.1–15.9)

## 2019-01-24 LAB — BLOOD GAS, ARTERIAL
Acid-Base Excess: 2.3 mmol/L — ABNORMAL HIGH (ref 0.0–2.0)
Bicarbonate: 25.6 mmol/L (ref 20.0–28.0)
Drawn by: 404151
O2 Content: 8 L/min
O2 Saturation: 85.4 %
Patient temperature: 103.3
pCO2 arterial: 39.2 mmHg (ref 32.0–48.0)
pH, Arterial: 7.443 (ref 7.350–7.450)
pO2, Arterial: 57 mmHg — ABNORMAL LOW (ref 83.0–108.0)

## 2019-01-24 LAB — TYPE AND SCREEN
ABO/RH(D): O POS
Antibody Screen: NEGATIVE

## 2019-01-24 LAB — FERRITIN: Ferritin: 219 ng/mL (ref 11–307)

## 2019-01-24 LAB — GLUCOSE, CAPILLARY
Glucose-Capillary: 294 mg/dL — ABNORMAL HIGH (ref 70–99)
Glucose-Capillary: 356 mg/dL — ABNORMAL HIGH (ref 70–99)
Glucose-Capillary: 320 mg/dL — ABNORMAL HIGH (ref 70–99)
Glucose-Capillary: 174 mg/dL — ABNORMAL HIGH (ref 70–99)

## 2019-01-24 LAB — D-DIMER, QUANTITATIVE: D-Dimer, Quant: 1.5 ug/mL-FEU — ABNORMAL HIGH (ref 0.00–0.50)

## 2019-01-24 LAB — INTERLEUKIN-6, PLASMA: Interleukin-6, Plasma: 110.7 pg/mL — ABNORMAL HIGH (ref 0.0–12.2)

## 2019-01-24 LAB — C-REACTIVE PROTEIN: CRP: 29.4 mg/dL — ABNORMAL HIGH (ref <1.0)

## 2019-01-24 LAB — HEPATITIS B SURFACE ANTIGEN: Hepatitis B Surface Ag: NEGATIVE

## 2019-01-24 MED ORDER — OXYCODONE HCL 5 MG PO TABS
5.0000 mg | ORAL_TABLET | ORAL | Status: DC | PRN
  Administered 2019-01-24 – 2019-01-25 (×5): 10 mg via ORAL
  Filled 2019-01-24 (×5): qty 2

## 2019-01-24 MED ORDER — INSULIN GLARGINE 100 UNIT/ML ~~LOC~~ SOLN
10.0000 [IU] | Freq: Once | SUBCUTANEOUS | Status: AC
  Administered 2019-01-24: 10 [IU] via SUBCUTANEOUS
  Filled 2019-01-24: qty 0.1

## 2019-01-24 MED ORDER — INSULIN GLARGINE 100 UNIT/ML ~~LOC~~ SOLN
50.0000 [IU] | Freq: Every day | SUBCUTANEOUS | Status: DC
  Administered 2019-01-25: 50 [IU] via SUBCUTANEOUS
  Filled 2019-01-24 (×2): qty 0.5

## 2019-01-24 MED ORDER — HYDROMORPHONE HCL 1 MG/ML IJ SOLN
1.0000 mg | INTRAMUSCULAR | Status: DC | PRN
  Administered 2019-01-25: 1 mg via INTRAVENOUS
  Filled 2019-01-24: qty 1

## 2019-01-24 MED ORDER — VANCOMYCIN HCL 10 G IV SOLR
1250.0000 mg | Freq: Two times a day (BID) | INTRAVENOUS | Status: DC
  Administered 2019-01-24 – 2019-01-25 (×2): 1250 mg via INTRAVENOUS
  Filled 2019-01-24 (×5): qty 1250

## 2019-01-24 MED ORDER — ACETAMINOPHEN 500 MG PO TABS
1000.0000 mg | ORAL_TABLET | Freq: Four times a day (QID) | ORAL | Status: DC
  Administered 2019-01-24 – 2019-01-25 (×5): 1000 mg via ORAL
  Filled 2019-01-24 (×4): qty 2

## 2019-01-24 MED ORDER — INSULIN GLARGINE 100 UNIT/ML ~~LOC~~ SOLN
50.0000 [IU] | Freq: Every day | SUBCUTANEOUS | Status: DC
  Filled 2019-01-24 (×2): qty 0.5

## 2019-01-24 MED ORDER — INSULIN ASPART 100 UNIT/ML ~~LOC~~ SOLN
10.0000 [IU] | Freq: Once | SUBCUTANEOUS | Status: AC
  Administered 2019-01-24: 10 [IU] via SUBCUTANEOUS

## 2019-01-24 MED ORDER — SODIUM CHLORIDE 0.9 % IV BOLUS
1000.0000 mL | Freq: Once | INTRAVENOUS | Status: AC
  Administered 2019-01-24: 1000 mL via INTRAVENOUS

## 2019-01-24 NOTE — Progress Notes (Signed)
Patient ID: Maureen Price, female   DOB: 05/20/1976, 43 y.o.   MRN: [id number]    1 Day Post-Op  Subjective: C/o pain in her perineum, otherwise ok  Objective: Vital signs in last 24 hours: Temp:  [98.7 F (37.1 C)-99.4 F (37.4 C)] 99.4 F (37.4 C) (05/19 0800) Pulse Rate:  [76-101] 96 (05/19 0800) Resp:  [18-24] 18 (05/19 0800) BP: (106-122)/(60-72) 121/72 (05/19 0800) SpO2:  [92 %-99 %] 99 % (05/19 0800) Weight:  [103.4 kg] 103.4 kg (05/19 0600) Last BM Date: 01/22/19  Intake/Output from previous day: 05/18 0701 - 05/19 0700 In: 3595 [P.O.:60; I.V.:2685; IV Piggyback:850] Out: 2 [Blood:2] Intake/Output this shift: No intake/output data recorded.  PE: GU: right perineal wound is unpacked and mostly clean.  Still with induration tracking towards her gluteus.  Overall minimal necrotic tissue noted.  No purulent drainage noted  Lab Results:  Recent Labs    01/22/19 2037 01/23/19 0513  WBC 23.4* 25.8*  HGB 13.3 12.1  HCT 38.7 34.1*  PLT 251 254   BMET Recent Labs    01/23/19 0513 01/23/19 1436  NA 136 136  K 3.0* 3.4*  CL 103 104  CO2 20* 18*  GLUCOSE 269* 259*  BUN 6 6  CREATININE 0.54 0.59  CALCIUM 8.4* 8.3*   PT/INR Recent Labs    01/23/19 0019  LABPROT 15.8*  INR 1.3*   CMP     Component Value Date/Time   NA 136 01/23/2019 1436   K 3.4 (L) 01/23/2019 1436   CL 104 01/23/2019 1436   CO2 18 (L) 01/23/2019 1436   GLUCOSE 259 (H) 01/23/2019 1436   BUN 6 01/23/2019 1436   CREATININE 0.59 01/23/2019 1436   CREATININE 0.54 03/23/2014 1529   CALCIUM 8.3 (L) 01/23/2019 1436   PROT 6.9 01/22/2019 2037   ALBUMIN 2.1 (L) 01/23/2019 1436   AST 18 01/22/2019 2037   ALT 23 01/22/2019 2037   ALKPHOS 134 (H) 01/22/2019 2037   BILITOT 0.8 01/22/2019 2037   GFRNONAA >60 01/23/2019 1436   GFRAA >60 01/23/2019 1436   Lipase     Component Value Date/Time   LIPASE 18 (L) 01/21/2015 0723       Studies/Results: Ct Abdomen Pelvis W Contrast   Result Date: 01/22/2019 CLINICAL DATA:  Perirectal pain and swelling EXAM: CT ABDOMEN AND PELVIS WITH CONTRAST TECHNIQUE: Multidetector CT imaging of the abdomen and pelvis was performed using the standard protocol following bolus administration of intravenous contrast. CONTRAST:  156mL OMNIPAQUE IOHEXOL 300 MG/ML  SOLN COMPARISON:  None. FINDINGS: Lower chest: No acute abnormality. Hepatobiliary: No focal liver abnormality is seen. Status post cholecystectomy. No biliary dilatation. Pancreas: Unremarkable. No pancreatic ductal dilatation or surrounding inflammatory changes. Spleen: Normal in size without focal abnormality. Adrenals/Urinary Tract: Adrenal glands are unremarkable. Kidneys are normal, without renal calculi, focal lesion, or hydronephrosis. Bladder is unremarkable. Stomach/Bowel: Stomach is within normal limits. Appendix appears normal. No evidence of bowel wall thickening, distention, or inflammatory changes. Vascular/Lymphatic: Calcific atherosclerosis. No enlarged abdominal or pelvic lymph nodes. Reproductive: No mass or other abnormality. Other: No abdominal wall hernia or abnormality. No abdominopelvic ascites. There is subcutaneous emphysema in the soft tissues about the right aspect of the anus, perineum, and posterior right vulva (series 12, image 5, series 14, image 73). No discrete fluid collections appreciated. Musculoskeletal: No acute or significant osseous findings. IMPRESSION: There is subcutaneous emphysema in the soft tissues about the right aspect of the anus, perineum, and posterior right vulva (series 12,  image 5, series 14, image 73). No discrete fluid collections appreciated. Findings are concerning for gas-forming infection (Fournier's gangrene). Recommend surgical consultation. Electronically Signed   By: Eddie Candle M.D.   On: 01/22/2019 23:14   Dg Chest Port 1 View  Result Date: 01/23/2019 CLINICAL DATA:  Fevers and positive COVID-19 EXAM: PORTABLE CHEST 1 VIEW  COMPARISON:  09/19/2016 FINDINGS: Cardiac shadows within normal limits. The overall inspiratory effort is poor. Some patchy increased density is noted in the right upper lobe which could be related to mild ground-glass infiltrate. No sizable effusion is seen. No bony abnormality is noted. IMPRESSION: Patchy right upper lobe infiltrate. Electronically Signed   By: Inez Catalina M.D.   On: 01/23/2019 07:22    Anti-infectives: Anti-infectives (From admission, onward)   Start     Dose/Rate Route Frequency Ordered Stop   01/23/19 1215  aztreonam (AZACTAM) 2 g in sodium chloride 0.9 % 100 mL IVPB  Status:  Discontinued     2 g 200 mL/hr over 30 Minutes Intravenous To Surgery 01/23/19 1207 01/23/19 1350   01/23/19 1200  vancomycin (VANCOCIN) IVPB 1000 mg/200 mL premix     1,000 mg 200 mL/hr over 60 Minutes Intravenous Every 12 hours 01/22/19 2359     01/23/19 0800  clindamycin (CLEOCIN) IVPB 900 mg     900 mg 100 mL/hr over 30 Minutes Intravenous Every 8 hours 01/23/19 0035     01/23/19 0000  clindamycin (CLEOCIN) IVPB 900 mg     900 mg 100 mL/hr over 30 Minutes Intravenous  Once 01/22/19 2350 01/23/19 0101   01/23/19 0000  aztreonam (AZACTAM) 2 g in sodium chloride 0.9 % 100 mL IVPB     2 g 200 mL/hr over 30 Minutes Intravenous Every 8 hours 01/22/19 2353     01/23/19 0000  vancomycin (VANCOCIN) 1,500 mg in sodium chloride 0.9 % 500 mL IVPB     1,500 mg 250 mL/hr over 120 Minutes Intravenous  Once 01/22/19 2353 01/23/19 1930       Assessment/Plan POD 1, s/p debridement of Fournier's gangrene  -start BID dressing changes today -cont abx therapy -CX: ABUNDANT GRAM NEGATIVE RODS  FEW GRAM VARIABLE ROD  FEW GRAM POSITIVE COCCI  -will watch wound til tomorrow and if doing well then she can likely [place] to [place] secondary to COVID status. -repeat CBC, none checked today  COVID 19 + -per medicine  FEN - carb mod diet VTE - SCDs/ok for chemical prophylaxis from surgical standpoint ID - vanc,  clindamycin   LOS: 1 day    Henreitta Cea , [facility] Surgery 01/24/2019, 8:06 AM Pager: [phone]

## 2019-01-24 NOTE — Progress Notes (Addendum)
Inpatient Diabetes Program Recommendations  AACE/ADA: New Consensus Statement on Inpatient Glycemic Control (2015)  Target Ranges:  Prepandial:   less than 140 mg/dL      Peak postprandial:   less than 180 mg/dL (1-2 hours)      Critically ill patients:  140 - 180 mg/dL   Lab Results  Component Value Date   GLUCAP 294 (H) 01/24/2019   HGBA1C 11.3 (H) 01/23/2019    Review of Glycemic Control Results for GRACIELYNN, BIRKEL (MRN [id number]) as of 01/24/2019 09:32  Ref. Range 01/23/2019 14:12 01/23/2019 21:43 01/23/2019 21:46 01/24/2019 07:42  Glucose-Capillary Latest Ref Range: 70 - 99 mg/dL 242 (H) 441 (H) 449 (H) 294 (H)   Diabetes history: Type 2 DM Outpatient Diabetes medications: Farxiga 10 mg QD, Tresiba 50 units QD, Bydureon 2 mg Q week Current orders for Inpatient glycemic control: Lantus 36 units QD, Novolog 0-9 units TID, Novolog 0-5 units QHS  Inpatient Diabetes Program Recommendations:    -Increase Lantus to 50 units QD -Increase correction to Novolog 0-15 units TID -Add meal coverage: Novolog 4 units TID (assuming patient is consuming >50% of meal) - Would NOT recommend Farxiga at discharge. SGLT-2 drugs are contraindicated in patient's with Fournier's gangrene.  Spoke with patient regarding outpatient glycemic control. Patient was working two jobs and was not taking outpatient medications.  Reviewed patient's current A1c of 11.4%. Explained what a A1c is and what it measures. Also reviewed goal A1c with patient, importance of good glucose control @ home, and blood sugar goals. Reviewed patho of DM, need for insulin, role of pancreas, carb modified diet, impact of glucose with Fournier's gangrene, discussed SGLT-2 action, vascular changes and comorbidites. Patient will need a meter at discharge. Glucose meter kit (inlcudes lancets and strips) ([id number]). Enocuraged to begin checking CBGs 2-3 times per day, reviewed scheduled and when to call MD. Encouraged endocrinology  appointment, if no improvement. Reviewed the importance of taking medications as prescribed and again, reviewed long term impact of missing doses and increased A1C.  Patient plans to start meal prepping and doing better. Admits to drinking a lot of [place]. Reviewed goal setting, the importance of choosing foods based off of nutritional value and eliminating sugary beverages from diet. Patient is not interested in speaking with dietitian. She states, ":I know what to do, I just got to do it. " Patient has no further questions at this time and has an outpt PCP appointment.   Thanks, Bronson Curb, MSN, RNC-OB Diabetes Coordinator [phone] (8a-5p)

## 2019-01-24 NOTE — Progress Notes (Addendum)
Pharmacy Antibiotic Note  Maureen Price is a 43 y.o. female admitted on 01/22/2019 with Fournier gangrene. Pharmacy has been consulted for Vancomycin dosing. Patient also placed on Aztreonam and Clindamycin per MD.     Plan: Adjust Vancomycin to 1250mg  IV q12h Plan for Vancomycin levels at steady state, as indicated Aztreonam 2g IV q8h Clindamycin 900mg  IV q8h Monitor renal function, cultures, clinical course  Height: 5' 5.5" (166.4 cm) Weight: 227 lb 15.3 oz (103.4 kg) IBW/kg (Calculated) : 58.15  Temp (24hrs), Avg:99 F (37.2 C), Min:98.5 F (36.9 C), Max:99.4 F (37.4 C)  Recent Labs  Lab 01/22/19 2037 01/22/19 2222 01/23/19 0513 01/23/19 1436  WBC 23.4*  --  25.8*  --   CREATININE 0.70  --  0.54 0.59  LATICACIDVEN 1.9 1.2  --   --     Estimated Creatinine Clearance: 110.3 mL/min (by C-G formula based on SCr of 0.59 mg/dL).    Allergies  Allergen Reactions  . Erythromycin Anaphylaxis  . Penicillins Anaphylaxis  . Metformin And Related Other (See Comments)    Diarrhea, upset stomach  . Viibryd [Vilazodone Hcl] Other (See Comments)    Altered mental status.    . Bee Venom Swelling    Throat swells    Antimicrobials this admission: Vanc 5/17 >> Aztreonam 5/17 >> Clindamycin 5/18 >>  Microbiology results: 5/17 BCx: NGTD 5/18 COVID: positive 5/18 Perineum abscess: abundant GNR, moderate GVR, few GPC on GS; few Strep anginosis on culture, IP 5/18 Perineum tissue, abscess: abundant GNR, few GVR, few GPC on GS, cx reincubated for better growth  5/18 Fungus cx, abscess: sent     Lindell Spar, PharmD, BCPS Clinical Pharmacist Please check AMION for all [place] numbers 01/24/2019 3:08 PM

## 2019-01-24 NOTE — Progress Notes (Addendum)
Nurse walked in patient room and patient showed labored breathing with complaint of dizziness. Pt removed nasal cannula and was on room air, oxygen saturation 65%, HR tachy, temp 103.3. Pt placed on face mask 10L, oxygen saturation increased to 89-90%. RT called. Provider notified, awaiting orders.

## 2019-01-24 NOTE — Progress Notes (Addendum)
[facility]  PROGRESS NOTE    Maureen Price  PZW:[id number] DOB: 28-Nov-1975 DOA: 01/22/2019 PCP: [name], MD   Brief Narrative:   Maureen Gonterman Livingstonis a 43 y.o.femalewith medical history significant ofpoorly controlled diabetes, medication noncompliance, asthma, GERD, depression, anxiety, CAD, stent placement, tobacco abuse, obesity, who presents withpain in buttockand fever.  Pt states that she initiallynoticedpainfulboilson her buttocks and vaginal lip on Thursday. She attempted to do an epsom salt bath to relieve pain without help. The painhas been progressively worsening,the areahas been enlarging. Thepain is constant, sharp, 10 out of 10 in severity, nonradiating.She states that the boil self drainage once without improvement of pain.She developed fever and chills today. Patient states that she has a mild dry cough due to smoking, but no chest pain, shortness of breath. She does not have nausea vomiting, diarrhea or abdominal pain. She has some dysuria and burning on urination.   Assessment & Plan:   Principal Problem:   Fournier gangrene Active Problems:   Uncontrolled diabetes mellitus type 2 with atherosclerosis of arteries of extremities (HCC)   Obesity   Coronary artery disease   Tobacco use disorder   Asthma   Anxiety   Sepsis ([place])   Hypokalemia   COVID-19 virus infection   Sepsis due to Fournier gangrene:      - Patient meets criteria for sepsis with leukocytosis, fever, tachycardia and tachypnea.        - Lactic acid normal, 1.9, 1.2.       - General surgery consutled, appreciate assistance, now s/p I&D     - continue vanc, clinda, aztreonam     - Bld Cx pending     - fluids     - percocet, dilaudid PRN     - surgery monitoring to see if there is anything else to add; per team "Will look in am and if ok can consider tx to [place]"   Uncontrolled diabetes mellitus type 2 with atherosclerosis of arteries of extremities ([place]):      - Patient  has not been compliant to taking insulin and diabetic medications.       - Blood sugar 460, anion gap 14. Last A1c 10.5, poorly controled.      - Patient is supposed to take [place] 50 units daily, [place], [place], but taking any of them.     - start Tresiba 35 units daily     - SSI     - A1c: 11.3     - increase lantus to 50; monitor  Morbid obesity:      - Diet and exercise.      - Encouraged to lose weight.  Coronary artery disease: s/p of sent.     - restart ASA     - hold plavix due to surgery     - prn NTG  Tobacco use disorder:     - Did counseling about importance of quitting smoking     - Nicotine patch  Anxiety:     - Continue home Xaxax  Hypokalemia:     - repleted  Asthma: stable     - prn albuterol inhaler and mucinex  COVID positive     - no resp sx     - trend CRP, ferritin, d-dimer  Glucoses trending back up. Increase basal insulin. Appreciate surgery team assistance. Continue as above.  DVT prophylaxis: SCDs Code Status: FULL   Disposition Plan: TBD   Consultants:   General Surgery  Procedures:   I&D of  Perirectal Abscess  Antimicrobials:  [facility] Vanc cleocin, aztreonam    Subjective: "I'm still sore"  Objective: Vitals:   01/24/19 0600 01/24/19 0800 01/24/19 0930 01/24/19 1140  BP:  121/72 (!) 113/58 129/74  Pulse:  96 92   Resp:  18 18   Temp:  99.4 F (37.4 C) 98.5 F (36.9 C) 99.4 F (37.4 C)  TempSrc:  Axillary Oral Oral  SpO2:  99% 98%   Weight: 103.4 kg     Height:        Intake/Output Summary (Last 24 hours) at 01/24/2019 1305 Last data filed at 01/24/2019 0931 Gross per 24 hour  Intake 2185.03 ml  Output 2 ml  Net 2183.03 ml   Filed Weights   01/22/19 2009 01/23/19 0430 01/24/19 0600  Weight: 97.5 kg 95.2 kg 103.4 kg    Examination:  General: 43 y.o. female resting in bed in NAD Cardiovascular: RRR, +S1, S2, no m/g/r, equal pulses throughout Respiratory: CTABL, no w/r/r, normal WOB GI: BS+, NDNT, no  masses noted, no organomegaly noted MSK: No e/c/c Skin: No rashes, bruises, ulcerations noted Neuro: A&O x 3, no focal deficits Psyc: Appropriate interaction and affect, calm/cooperative    Data Reviewed: I have personally reviewed following labs and imaging studies.  CBC: Recent Labs  Lab 01/22/19 2037 01/23/19 0513  WBC 23.4* 25.8*  NEUTROABS 19.6*  --   HGB 13.3 12.1  HCT 38.7 34.1*  MCV 80.1 78.4*  PLT 251 315   Basic Metabolic Panel: Recent Labs  Lab 01/22/19 2037 01/23/19 0513 01/23/19 1436  NA 127* 136 136  K 3.3* 3.0* 3.4*  CL 93* 103 104  CO2 20* 20* 18*  GLUCOSE 460* 269* 259*  BUN 6 6 6   CREATININE 0.70 0.54 0.59  CALCIUM 8.6* 8.4* 8.3*  PHOS  --   --  3.4   GFR: Estimated Creatinine Clearance: 110.3 mL/min (by C-G formula based on SCr of 0.59 mg/dL). Liver Function Tests: Recent Labs  Lab 01/22/19 2037 01/23/19 1436  AST 18  --   ALT 23  --   ALKPHOS 134*  --   BILITOT 0.8  --   PROT 6.9  --   ALBUMIN 2.7* 2.1*   No results for input(s): LIPASE, AMYLASE in the last 168 hours. No results for input(s): AMMONIA in the last 168 hours. Coagulation Profile: Recent Labs  Lab 01/23/19 0019  INR 1.3*   Cardiac Enzymes: Recent Labs  Lab 01/23/19 0513  TROPONINI 0.03*   BNP (last 3 results) No results for input(s): PROBNP in the last 8760 hours. HbA1C: Recent Labs    01/23/19 1436  HGBA1C 11.3*   CBG: Recent Labs  Lab 01/23/19 1412 01/23/19 2143 01/23/19 2146 01/24/19 0742 01/24/19 1144  GLUCAP 242* 441* 449* 294* 356*   Lipid Profile: Recent Labs    01/23/19 0513  TRIG 92   Thyroid Function Tests: No results for input(s): TSH, T4TOTAL, FREET4, T3FREE, THYROIDAB in the last 72 hours. Anemia Panel: Recent Labs    01/23/19 0513 01/24/19 0525  FERRITIN 206 219   Sepsis Labs: Recent Labs  Lab 01/22/19 2037 01/22/19 2222 01/23/19 0019  PROCALCITON  --   --  0.24  LATICACIDVEN 1.9 1.2  --     Recent Results (from  the past 240 hour(s))  Culture, blood (routine x 2)     Status: None (Preliminary result)   Collection Time: 01/22/19  8:40 PM  Result Value Ref Range Status   Specimen Description BLOOD LEFT HAND  Final   Special Requests   Final    BOTTLES DRAWN AEROBIC AND ANAEROBIC Blood Culture adequate volume   Culture   Final    NO GROWTH 2 DAYS Performed at [facility], [address], [place], [place] [address]    Report Status PENDING  Incomplete  Culture, blood (routine x 2)     Status: None (Preliminary result)   Collection Time: 01/22/19  8:58 PM  Result Value Ref Range Status   Specimen Description BLOOD RIGHT ANTECUBITAL  Final   Special Requests   Final    BOTTLES DRAWN AEROBIC ONLY Blood Culture adequate volume   Culture   Final    NO GROWTH 2 DAYS Performed at [facility], [place] [address] [place], [place] [address]    Report Status PENDING  Incomplete  SARS Coronavirus 2 (CEPHEID - Performed in [place] hospital lab), Hosp Order     Status: Abnormal   Collection Time: 01/23/19  1:19 AM  Result Value Ref Range Status   SARS Coronavirus 2 POSITIVE (A) NEGATIVE Final    Comment: RESULT CALLED TO, READ BACK BY AND VERIFIED WITH: R FITZGERAKD ANSTI @0235  01/23/19 BY S GEZAHEGN (NOTE) If result is NEGATIVE SARS-CoV-2 target nucleic acids are NOT DETECTED. The SARS-CoV-2 RNA is generally detectable in upper and lower  respiratory specimens during the acute phase of infection. The lowest  concentration of SARS-CoV-2 viral copies this assay can detect is 250  copies / mL. A negative result does not preclude SARS-CoV-2 infection  and should not be used as the sole basis for treatment or other  patient management decisions.  A negative result may occur with  improper specimen collection / handling, submission of specimen other  than nasopharyngeal swab, presence of viral mutation(s) within the  areas targeted by this assay, and inadequate number of viral copies  (<250  copies / mL). A negative result must be combined with clinical  observations, patient history, and epidemiological information. If result is POSITIVE SARS-CoV-2 target nucleic acids are D ETECTED. The SARS-CoV-2 RNA is generally detectable in upper and lower  respiratory specimens during the acute phase of infection.  Positive  results are indicative of active infection with SARS-CoV-2.  Clinical  correlation with patient history and other diagnostic information is  necessary to determine patient infection status.  Positive results do  not rule out bacterial infection or co-infection with other viruses. If result is PRESUMPTIVE POSTIVE SARS-CoV-2 nucleic acids MAY BE PRESENT.   A presumptive positive result was obtained on the submitted specimen  and confirmed on repeat testing.  While 2019 novel coronavirus  (SARS-CoV-2) nucleic acids may be present in the submitted sample  additional confirmatory testing may be necessary for epidemiological  and / or clinical management purposes  to differentiate between  SARS-CoV-2 and other Sarbecovirus currently known to infect humans.  If clinically indicated additional testing with an alternate test  methodology [phone]) is advised. The SARS-CoV-2 RNA is generally  detectable in upper and lower respiratory specimens during the acute  phase of infection. The expected result is Negative. Fact Sheet for Patients:  StrictlyIdeas.no Fact Sheet for Healthcare Providers: BankingDealers.co.za This test is not yet approved or cleared by the [place] FDA and has been authorized for detection and/or diagnosis of SARS-CoV-2 by FDA under an Emergency Use Authorization (EUA).  This EUA will remain in effect (meaning this test can be used) for the duration of the COVID-19 declaration under Section 564(b)(1) of  the Act, 21 U.S.C. section 360bbb-3(b)(1), unless the authorization is terminated or revoked  sooner. Performed at [facility], [place] [address] [place], [place] [address]   Aerobic/Anaerobic Culture (surgical/deep wound)     Status: None (Preliminary result)   Collection Time: 01/23/19 12:16 PM  Result Value Ref Range Status   Specimen Description ABSCESS PERINEUM  Final   Special Requests PATIENT ON FOLLOWING CLEOCIN AND AZACTAM  Final   Gram Stain   Final    ABUNDANT WBC PRESENT,BOTH PMN AND MONONUCLEAR ABUNDANT GRAM NEGATIVE RODS MODERATE GRAM VARIABLE ROD FEW GRAM POSITIVE COCCI    Culture   Final    CULTURE REINCUBATED FOR BETTER GROWTH Performed at [facility], [place] [address] [place], [place] [address]    Report Status PENDING  Incomplete  Aerobic/Anaerobic Culture (surgical/deep wound)     Status: None (Preliminary result)   Collection Time: 01/23/19 12:41 PM  Result Value Ref Range Status   Specimen Description TISSUE PERINEUM  Final   Special Requests PATIENT ON FOLLOWING CLEOCIN AND AZACTAM  Final   Gram Stain   Final    MODERATE WBC PRESENT,BOTH PMN AND MONONUCLEAR ABUNDANT GRAM NEGATIVE RODS FEW GRAM VARIABLE ROD FEW GRAM POSITIVE COCCI    Culture   Final    CULTURE REINCUBATED FOR BETTER GROWTH Performed at [facility], [place] [address], [place], [place] [address]    Report Status PENDING  Incomplete         Radiology Studies: Ct Abdomen Pelvis W Contrast  Result Date: 01/22/2019 CLINICAL DATA:  Perirectal pain and swelling EXAM: CT ABDOMEN AND PELVIS WITH CONTRAST TECHNIQUE: Multidetector CT imaging of the abdomen and pelvis was performed using the standard protocol following bolus administration of intravenous contrast. CONTRAST:  121mL OMNIPAQUE IOHEXOL 300 MG/ML  SOLN COMPARISON:  None. FINDINGS: Lower chest: No acute abnormality. Hepatobiliary: No focal liver abnormality is seen. Status post cholecystectomy. No biliary dilatation. Pancreas: Unremarkable. No pancreatic ductal dilatation or surrounding inflammatory changes.  Spleen: Normal in size without focal abnormality. Adrenals/Urinary Tract: Adrenal glands are unremarkable. Kidneys are normal, without renal calculi, focal lesion, or hydronephrosis. Bladder is unremarkable. Stomach/Bowel: Stomach is within normal limits. Appendix appears normal. No evidence of bowel wall thickening, distention, or inflammatory changes. Vascular/Lymphatic: Calcific atherosclerosis. No enlarged abdominal or pelvic lymph nodes. Reproductive: No mass or other abnormality. Other: No abdominal wall hernia or abnormality. No abdominopelvic ascites. There is subcutaneous emphysema in the soft tissues about the right aspect of the anus, perineum, and posterior right vulva (series 12, image 5, series 14, image 73). No discrete fluid collections appreciated. Musculoskeletal: No acute or significant osseous findings. IMPRESSION: There is subcutaneous emphysema in the soft tissues about the right aspect of the anus, perineum, and posterior right vulva (series 12, image 5, series 14, image 73). No discrete fluid collections appreciated. Findings are concerning for gas-forming infection (Fournier's gangrene). Recommend surgical consultation. Electronically Signed   By: Eddie Candle M.D.   On: 01/22/2019 23:14   Dg Chest Port 1 View  Result Date: 01/23/2019 CLINICAL DATA:  Fevers and positive COVID-19 EXAM: PORTABLE CHEST 1 VIEW COMPARISON:  09/19/2016 FINDINGS: Cardiac shadows within normal limits. The overall inspiratory effort is poor. Some patchy increased density is noted in the right upper lobe which could be related to mild ground-glass infiltrate. No sizable effusion is seen. No bony abnormality is noted. IMPRESSION: Patchy right upper lobe infiltrate. Electronically Signed   By: Inez Catalina M.D.   On:  01/23/2019 07:22        Scheduled Meds: . acetaminophen  1,000 mg Oral Q6H  . aspirin EC  81 mg Oral Daily  . calcium carbonate  1 tablet Oral Daily  . insulin aspart  0-5 Units Subcutaneous  QHS  . insulin aspart  0-9 Units Subcutaneous TID WC  . insulin aspart  10 Units Subcutaneous Once  . insulin glargine  10 Units Subcutaneous Once  . [START ON 01/25/2019] insulin glargine  50 Units Subcutaneous Daily  . multivitamin with minerals  1 tablet Oral Daily  . nicotine  21 mg Transdermal Daily  . omega-3 acid ethyl esters  1 g Oral Daily  . vitamin B-12  2,000 mcg Oral Daily  . vitamin C  1,000 mg Oral Daily   Continuous Infusions: . sodium chloride 100 mL/hr at 01/24/19 0634  . aztreonam 2 g (01/24/19 0554)  . clindamycin (CLEOCIN) IV 900 mg (01/24/19 8502)  . vancomycin 1,000 mg (01/24/19 1153)     LOS: 1 day    Time spent: 25 minutes spent in the coordination of care today.    Jonnie Finner, DO [facility] Pager [phone]  If 7PM-7AM, please contact night-coverage [email or web address] Password [email or web address] 01/24/2019, 1:05 PM

## 2019-01-25 ENCOUNTER — Inpatient Hospital Stay (HOSPITAL_COMMUNITY): Payer: BC Managed Care – PPO

## 2019-01-25 ENCOUNTER — Inpatient Hospital Stay: Payer: Self-pay

## 2019-01-25 DIAGNOSIS — J9601 Acute respiratory failure with hypoxia: Secondary | ICD-10-CM

## 2019-01-25 DIAGNOSIS — U071 COVID-19: Secondary | ICD-10-CM

## 2019-01-25 DIAGNOSIS — J80 Acute respiratory distress syndrome: Secondary | ICD-10-CM

## 2019-01-25 LAB — CBC WITH DIFFERENTIAL/PLATELET
Abs Immature Granulocytes: 0.41 10*3/uL — ABNORMAL HIGH (ref 0.00–0.07)
Basophils Absolute: 0.1 10*3/uL (ref 0.0–0.1)
Basophils Relative: 0 %
Eosinophils Absolute: 0 10*3/uL (ref 0.0–0.5)
Eosinophils Relative: 0 %
HCT: 31.4 % — ABNORMAL LOW (ref 36.0–46.0)
Hemoglobin: 10.9 g/dL — ABNORMAL LOW (ref 12.0–15.0)
Immature Granulocytes: 2 %
Lymphocytes Relative: 10 %
Lymphs Abs: 2.3 10*3/uL (ref 0.7–4.0)
MCH: 27.7 pg (ref 26.0–34.0)
MCHC: 34.7 g/dL (ref 30.0–36.0)
MCV: 79.7 fL — ABNORMAL LOW (ref 80.0–100.0)
Monocytes Absolute: 1.6 10*3/uL — ABNORMAL HIGH (ref 0.1–1.0)
Monocytes Relative: 7 %
Neutro Abs: 19.4 10*3/uL — ABNORMAL HIGH (ref 1.7–7.7)
Neutrophils Relative %: 81 %
Platelets: 257 10*3/uL (ref 150–400)
RBC: 3.94 MIL/uL (ref 3.87–5.11)
RDW: 13.2 % (ref 11.5–15.5)
WBC: 23.7 10*3/uL — ABNORMAL HIGH (ref 4.0–10.5)
nRBC: 0 % (ref 0.0–0.2)

## 2019-01-25 LAB — D-DIMER, QUANTITATIVE: D-Dimer, Quant: 2.55 ug/mL-FEU — ABNORMAL HIGH (ref 0.00–0.50)

## 2019-01-25 LAB — POCT I-STAT 7, (LYTES, BLD GAS, ICA,H+H)
Acid-base deficit: 1 mmol/L (ref 0.0–2.0)
Bicarbonate: 26 mmol/L (ref 20.0–28.0)
Bicarbonate: 25.5 mmol/L (ref 20.0–28.0)
Calcium, Ion: 1.21 mmol/L (ref 1.15–1.40)
Calcium, Ion: 1.17 mmol/L (ref 1.15–1.40)
HCT: 33 % — ABNORMAL LOW (ref 36.0–46.0)
HCT: 25 % — ABNORMAL LOW (ref 36.0–46.0)
Hemoglobin: 11.2 g/dL — ABNORMAL LOW (ref 12.0–15.0)
Hemoglobin: 8.5 g/dL — ABNORMAL LOW (ref 12.0–15.0)
O2 Saturation: 83 %
O2 Saturation: 98 %
Patient temperature: 103.1
Patient temperature: 98.5
Potassium: 3.1 mmol/L — ABNORMAL LOW (ref 3.5–5.1)
Potassium: 3.1 mmol/L — ABNORMAL LOW (ref 3.5–5.1)
Sodium: 140 mmol/L (ref 135–145)
Sodium: 141 mmol/L (ref 135–145)
TCO2: 27 mmol/L (ref 22–32)
TCO2: 27 mmol/L (ref 22–32)
pCO2 arterial: 54.7 mmHg — ABNORMAL HIGH (ref 32.0–48.0)
pCO2 arterial: 49.1 mmHg — ABNORMAL HIGH (ref 32.0–48.0)
pH, Arterial: 7.297 — ABNORMAL LOW (ref 7.350–7.450)
pH, Arterial: 7.322 — ABNORMAL LOW (ref 7.350–7.450)
pO2, Arterial: 61 mmHg — ABNORMAL LOW (ref 83.0–108.0)
pO2, Arterial: 107 mmHg (ref 83.0–108.0)

## 2019-01-25 LAB — RENAL FUNCTION PANEL
Albumin: 1.8 g/dL — ABNORMAL LOW (ref 3.5–5.0)
Anion gap: 10 (ref 5–15)
BUN: <5 mg/dL — ABNORMAL LOW (ref 6–20)
CO2: 24 mmol/L (ref 22–32)
Calcium: 8 mg/dL — ABNORMAL LOW (ref 8.9–10.3)
Chloride: 103 mmol/L (ref 98–111)
Creatinine, Ser: 0.49 mg/dL (ref 0.44–1.00)
GFR calc Af Amer: >60 mL/min (ref >60)
GFR calc non Af Amer: >60 mL/min (ref >60)
Glucose, Bld: 219 mg/dL — ABNORMAL HIGH (ref 70–99)
Phosphorus: 3.3 mg/dL (ref 2.5–4.6)
Potassium: 3.2 mmol/L — ABNORMAL LOW (ref 3.5–5.1)
Sodium: 137 mmol/L (ref 135–145)

## 2019-01-25 LAB — MAGNESIUM: Magnesium: 1.4 mg/dL — ABNORMAL LOW (ref 1.7–2.4)

## 2019-01-25 LAB — GLUCOSE, CAPILLARY
Glucose-Capillary: 217 mg/dL — ABNORMAL HIGH (ref 70–99)
Glucose-Capillary: 234 mg/dL — ABNORMAL HIGH (ref 70–99)
Glucose-Capillary: 163 mg/dL — ABNORMAL HIGH (ref 70–99)

## 2019-01-25 LAB — TRIGLYCERIDES: Triglycerides: 116 mg/dL (ref <150)

## 2019-01-25 LAB — FERRITIN: Ferritin: 254 ng/mL (ref 11–307)

## 2019-01-25 LAB — TYPE AND SCREEN
ABO/RH(D): O POS
Antibody Screen: NEGATIVE

## 2019-01-25 LAB — C-REACTIVE PROTEIN: CRP: 24 mg/dL — ABNORMAL HIGH (ref <1.0)

## 2019-01-25 MED ORDER — FENTANYL CITRATE (PF) 100 MCG/2ML IJ SOLN: 50.0000 ug | Freq: Once | INTRAMUSCULAR | Status: DC

## 2019-01-25 MED ORDER — ADULT MULTIVITAMIN W/MINERALS CH
1.0000 | ORAL_TABLET | Freq: Every day | ORAL | Status: DC
  Administered 2019-01-26 – 2019-01-30 (×5): 1
  Filled 2019-01-25 (×6): qty 1

## 2019-01-25 MED ORDER — SODIUM CHLORIDE 0.9 % IV SOLN
200.0000 mg | Freq: Once | INTRAVENOUS | Status: AC
  Administered 2019-01-25: 15:00:00 200 mg via INTRAVENOUS
  Filled 2019-01-25: qty 40

## 2019-01-25 MED ORDER — SODIUM CHLORIDE 0.9 % IV SOLN
100.0000 mg | INTRAVENOUS | Status: DC
  Administered 2019-01-26: 15:00:00 100 mg via INTRAVENOUS
  Filled 2019-01-25 (×3): qty 20

## 2019-01-25 MED ORDER — ROCURONIUM BROMIDE 50 MG/5ML IV SOLN: 80.0000 mg | Freq: Once | INTRAVENOUS | Status: DC

## 2019-01-25 MED ORDER — MIDAZOLAM HCL 2 MG/2ML IJ SOLN
2.0000 mg | INTRAMUSCULAR | Status: DC | PRN
  Administered 2019-01-25: 4 mg via INTRAVENOUS
  Administered 2019-01-26 – 2019-01-30 (×7): 2 mg via INTRAVENOUS
  Filled 2019-01-25 (×5): qty 2
  Filled 2019-01-25 (×2): qty 4
  Filled 2019-01-25 (×2): qty 2

## 2019-01-25 MED ORDER — MAGNESIUM SULFATE 2 GM/50ML IV SOLN
2.0000 g | Freq: Once | INTRAVENOUS | Status: DC
  Filled 2019-01-25 (×3): qty 50

## 2019-01-25 MED ORDER — FENTANYL CITRATE (PF) 100 MCG/2ML IJ SOLN
INTRAMUSCULAR | Status: AC
  Filled 2019-01-25: qty 2

## 2019-01-25 MED ORDER — SODIUM CHLORIDE 0.9 % IV BOLUS
1000.0000 mL | Freq: Once | INTRAVENOUS | Status: AC
  Administered 2019-01-25: 21:00:00 1000 mL via INTRAVENOUS

## 2019-01-25 MED ORDER — LORAZEPAM 2 MG/ML IJ SOLN: 2.0000 mg | Freq: Once | INTRAMUSCULAR | Status: DC

## 2019-01-25 MED ORDER — FENTANYL BOLUS VIA INFUSION
50.0000 ug | INTRAVENOUS | Status: DC | PRN
  Administered 2019-01-26 (×5): 50 ug via INTRAVENOUS
  Filled 2019-01-25: qty 50

## 2019-01-25 MED ORDER — SODIUM CHLORIDE 0.9% FLUSH: 10.0000 mL | INTRAVENOUS | Status: DC | PRN

## 2019-01-25 MED ORDER — FENTANYL 2500MCG IN NS 250ML (10MCG/ML) PREMIX INFUSION: 0.0000 ug/h | INTRAVENOUS | Status: DC

## 2019-01-25 MED ORDER — PROPOFOL 1000 MG/100ML IV EMUL
INTRAVENOUS | Status: AC
  Filled 2019-01-25: qty 100

## 2019-01-25 MED ORDER — PANTOPRAZOLE SODIUM 40 MG PO PACK
40.0000 mg | PACK | Freq: Every day | ORAL | Status: DC
  Administered 2019-01-26 – 2019-01-29 (×4): 40 mg
  Filled 2019-01-25 (×5): qty 20

## 2019-01-25 MED ORDER — POTASSIUM CHLORIDE CRYS ER 20 MEQ PO TBCR
40.0000 meq | EXTENDED_RELEASE_TABLET | Freq: Once | ORAL | Status: AC
  Administered 2019-01-25: 40 meq via ORAL
  Filled 2019-01-25: qty 2

## 2019-01-25 MED ORDER — CHLORHEXIDINE GLUCONATE 0.12% ORAL RINSE (MEDLINE KIT)
15.0000 mL | Freq: Two times a day (BID) | OROMUCOSAL | Status: DC
  Administered 2019-01-25 – 2019-01-30 (×10): 15 mL via OROMUCOSAL

## 2019-01-25 MED ORDER — ORAL CARE MOUTH RINSE
15.0000 mL | OROMUCOSAL | Status: DC
  Administered 2019-01-25 – 2019-01-30 (×42): 15 mL via OROMUCOSAL

## 2019-01-25 MED ORDER — SODIUM CHLORIDE 0.9% FLUSH
10.0000 mL | Freq: Two times a day (BID) | INTRAVENOUS | Status: DC
  Administered 2019-01-25 – 2019-02-03 (×18): 10 mL
  Administered 2019-02-04: 08:00:00 20 mL
  Administered 2019-02-05 – 2019-02-06 (×3): 10 mL

## 2019-01-25 MED ORDER — INSULIN ASPART 100 UNIT/ML ~~LOC~~ SOLN
0.0000 [IU] | SUBCUTANEOUS | Status: DC
  Administered 2019-01-25: 4 [IU] via SUBCUTANEOUS
  Administered 2019-01-26: 3 [IU] via SUBCUTANEOUS
  Administered 2019-01-26: 7 [IU] via SUBCUTANEOUS
  Administered 2019-01-26: 4 [IU] via SUBCUTANEOUS
  Administered 2019-01-27: 7 [IU] via SUBCUTANEOUS

## 2019-01-25 MED ORDER — ENOXAPARIN SODIUM 60 MG/0.6ML ~~LOC~~ SOLN
0.5000 mg/kg | Freq: Two times a day (BID) | SUBCUTANEOUS | Status: DC
  Administered 2019-01-25 – 2019-02-06 (×24): 50 mg via SUBCUTANEOUS
  Filled 2019-01-25: qty 0.5
  Filled 2019-01-25: qty 0.6
  Filled 2019-01-25: qty 0.5
  Filled 2019-01-25: qty 0.6
  Filled 2019-01-25 (×2): qty 0.5
  Filled 2019-01-25 (×2): qty 0.6
  Filled 2019-01-25 (×2): qty 0.5
  Filled 2019-01-25: qty 0.6
  Filled 2019-01-25 (×2): qty 0.5
  Filled 2019-01-25 (×2): qty 0.6
  Filled 2019-01-25 (×2): qty 0.5
  Filled 2019-01-25 (×2): qty 0.6
  Filled 2019-01-25 (×2): qty 0.5
  Filled 2019-01-25: qty 0.6
  Filled 2019-01-25 (×2): qty 0.5
  Filled 2019-01-25: qty 0.6
  Filled 2019-01-25: qty 0.5
  Filled 2019-01-25: qty 0.6
  Filled 2019-01-25: qty 0.5

## 2019-01-25 MED ORDER — CHLORHEXIDINE GLUCONATE CLOTH 2 % EX PADS
6.0000 | MEDICATED_PAD | Freq: Every day | CUTANEOUS | Status: DC
  Administered 2019-01-26 – 2019-02-06 (×11): 6 via TOPICAL

## 2019-01-25 MED ORDER — ETOMIDATE 2 MG/ML IV SOLN
20.0000 mg | Freq: Once | INTRAVENOUS | Status: AC
  Administered 2019-01-25: 14:00:00 20 mg via INTRAVENOUS

## 2019-01-25 MED ORDER — FENTANYL CITRATE (PF) 100 MCG/2ML IJ SOLN: 100.0000 ug | Freq: Once | INTRAMUSCULAR | Status: DC

## 2019-01-25 MED ORDER — ENOXAPARIN SODIUM 100 MG/ML ~~LOC~~ SOLN
100.0000 mg | Freq: Two times a day (BID) | SUBCUTANEOUS | Status: DC
  Filled 2019-01-25 (×2): qty 1

## 2019-01-25 MED ORDER — ASPIRIN 81 MG PO CHEW
81.0000 mg | CHEWABLE_TABLET | Freq: Every day | ORAL | Status: DC
  Administered 2019-01-26 – 2019-01-29 (×4): 81 mg
  Filled 2019-01-25 (×4): qty 1

## 2019-01-25 MED ORDER — PROPOFOL 1000 MG/100ML IV EMUL
0.0000 ug/kg/min | INTRAVENOUS | Status: DC
  Administered 2019-01-25: 50 ug/kg/min via INTRAVENOUS
  Administered 2019-01-25: 20 ug/kg/min via INTRAVENOUS
  Administered 2019-01-26: 23.533 ug/kg/min via INTRAVENOUS
  Filled 2019-01-25 (×5): qty 100

## 2019-01-25 MED ORDER — MIDAZOLAM HCL 2 MG/2ML IJ SOLN
INTRAMUSCULAR | Status: AC
  Filled 2019-01-25: qty 4

## 2019-01-25 MED ORDER — PROPOFOL 1000 MG/100ML IV EMUL: 5.0000 ug/kg/min | INTRAVENOUS | Status: DC

## 2019-01-25 MED ORDER — ROCURONIUM BROMIDE 50 MG/5ML IV SOSY
PREFILLED_SYRINGE | INTRAVENOUS | Status: AC
  Administered 2019-01-25: 80 mg
  Filled 2019-01-25: qty 5

## 2019-01-25 MED ORDER — VITAMIN C 500 MG PO TABS
1000.0000 mg | ORAL_TABLET | Freq: Every day | ORAL | Status: DC
  Administered 2019-01-26 – 2019-01-30 (×5): 1000 mg
  Filled 2019-01-25 (×5): qty 2

## 2019-01-25 MED ORDER — ETOMIDATE 2 MG/ML IV SOLN
INTRAVENOUS | Status: AC
  Filled 2019-01-25: qty 10

## 2019-01-25 MED ORDER — MIDAZOLAM HCL 2 MG/2ML IJ SOLN
4.0000 mg | Freq: Once | INTRAMUSCULAR | Status: AC
  Administered 2019-01-25: 14:00:00 4 mg via INTRAVENOUS

## 2019-01-25 MED ORDER — FENTANYL 2500MCG IN NS 250ML (10MCG/ML) PREMIX INFUSION
0.0000 ug/h | INTRAVENOUS | Status: DC
  Administered 2019-01-25: 15:00:00 200 ug/h via INTRAVENOUS
  Administered 2019-01-25 – 2019-01-26 (×2): 100 ug/h via INTRAVENOUS
  Filled 2019-01-25 (×3): qty 250

## 2019-01-25 NOTE — Progress Notes (Signed)
Peripherally Inserted Central Catheter/Midline Placement  The IV Nurse has discussed with the patient and/or persons authorized to consent for the patient, the purpose of this procedure and the potential benefits and risks involved with this procedure.  The benefits include less needle sticks, lab draws from the catheter, and the patient may be discharged home with the catheter. Risks include, but not limited to, infection, bleeding, blood clot (thrombus formation), and puncture of an artery; nerve damage and irregular heartbeat and possibility to perform a PICC exchange if needed/ordered by physician.  Alternatives to this procedure were also discussed.  Bard Power PICC patient education guide, fact sheet on infection prevention and patient information card has been provided to patient /or left at bedside.    PICC/Midline Placement Documentation  PICC Triple Lumen 56/86/16 PICC Right Basilic 43 cm 0 cm (Active)  Indication for Insertion or Continuance of Line Prolonged intravenous therapies 01/25/2019  4:58 PM  Exposed Catheter (cm) 0 cm 01/25/2019  4:58 PM  Site Assessment Clean;Dry;Intact 01/25/2019  4:58 PM  Lumen #1 Status Flushed;No blood return;Saline locked 01/25/2019  4:58 PM  Lumen #2 Status Flushed;Blood return noted;Saline locked 01/25/2019  4:58 PM  Lumen #3 Status Flushed;Blood return noted;Saline locked 01/25/2019  4:58 PM  Dressing Type Transparent 01/25/2019  4:58 PM  Dressing Status Intact;Dry;Clean;Antimicrobial disc in place 01/25/2019  4:58 PM  Dressing Change Due 02/01/19 01/25/2019  4:58 PM       [address] 01/25/2019, 5:35 PM

## 2019-01-25 NOTE — Progress Notes (Signed)
ABG results called to Dr. [name] [name]. Verbal order received to increase peep to 14. Okay to leave Vt at 440. ABG in am unless needed before then. Per Dr. [name] [name] pt may need an a-line at some point but no order given at this time due to pending transport to [facility].

## 2019-01-25 NOTE — Progress Notes (Signed)
Provider paged. Pt complained of increase SOB and cough. Pulse ox. 79% - 81% on 10L of HFNC. Oxygen on HFNC increased to 15L, pt oxygen saturation increased to 86%-88% on HFNC. Pt breathing unlabored & symmetrical.

## 2019-01-25 NOTE — Progress Notes (Signed)
[facility] Progress Note Patient Name: Maureen Price DOB: 1975-12-30 MRN: [id number]   Date of Service  01/25/2019  HPI/Events of Note  Multiple issues: 1. Hypotension - BP - 80/52 with MAP = 61 and 2. Request for Foley catheter. Patient with labial resection for Fournier's Gangrene.   eICU Interventions  Will order: 1. Bolus with 0.9 NaCl 1 liter IV over 1 hour now. 2. Place Foley catheter.      Intervention Category Major Interventions: Hypotension - evaluation and management  Lysle Dingwall 01/25/2019, 8:37 PM

## 2019-01-25 NOTE — Progress Notes (Signed)
[facility] Progress Note  Patient: Maureen Price ZOX:[id number]   PCP: [name], MD DOB: [birth year]   DOA: 01/22/2019   DOS: 01/25/2019   Date of Service: the patient was seen and examined on 01/25/2019  Brief hospital course: Pt. with PMH of Asthma, poorly controlled type II DM, GERD, depression, CAD S/P PCI, active smoker, obesity; admitted on 01/22/2019, presented with complaint of perineal pain, was found to have Fournier's gangrene.  S/P incision and debridement 01/23/2019.  Postop patient becoming more hypoxic.  Her SARS-CoV-2 was positive.  01/25/2019 patient comes more hypoxic unable to maintain adequate saturation on nonrebreather.  Transfer to ICU Currently further plan is to transfer care to ICU.  Subjective: Patient did not have any the morning I was able to partially breath.  No nausea no vomiting no chest pain or abdominal pain.  Reports some headache.  No diarrhea. Later on nonrebreather patient is not able to maintain adequate saturation.  70% percent FiO2.  CCM was consulted.  Patient was transferred to ICU.  Assessment and Plan: 1.  Sepsis present on admission secondary to Fournier's gangrene. S/P IND 01/23/2019. Appreciate general surgery assistance. Daily dressing changes per surgery. Blood cultures are negative for 3 days so far. Wound culture positive for strep angiosis as well as mixed gram-negative anaerobic flora.  Also positive for staph aureus. Currently the patient is on IV vancomycin clindamycin, aztreonam  2.  Acute hypoxic respiratory failure secondary to COVID-19 infection. Patient was positive for COVID-19 on admission. Postoperatively patient's oxygenation worsened further. Now on nonrebreather and still only 60 to 70% saturation. CCM consulted. Patient will be transferred to the ICU with the plan for intubating her as well as using gram deciliter. Further management per ICU for now.  3.  Uncontrolled type 2 diabetes mellitus with  peripheral vascular disease complication. Noncompliant with insulin. Was hypoglycemic on admission. Currently blood sugar is controlled better. Continue Lantus. Hemoglobin A1c 11.3. Management per CCM for now.  4.  Coronary artery disease. Peripheral vascular disease. S/P PCI. On aspirin currently Plavix on hold. No active issues for now.  5. Asthma. Currently stable. No evidence of acute exacerbation.  6.  Morbid obesity. Body mass index is 37.36 kg/m.  Monitor. Diet and exercise recommended.  7.  Active smoker. Continue nicotine patch.  Diet: Currently n.p.o. DVT Prophylaxis: subcutaneous Heparin  Advance goals of care discussion: Full code  Family Communication: no family was present at bedside, at the time of interview.   Disposition:  Discharge to be determined.  Consultants: PCCM General surgery  Procedures: S/P incision and drainage  Scheduled Meds: . aspirin  81 mg Per Tube Daily  . enoxaparin (LOVENOX) injection  0.5 mg/kg Subcutaneous Q12H  . etomidate      . fentaNYL      . fentaNYL (SUBLIMAZE) injection  50 mcg Intravenous Once  . insulin aspart  0-20 Units Subcutaneous Q4H  . insulin glargine  50 Units Subcutaneous Daily  . midazolam      . [START ON 01/26/2019] multivitamin with minerals  1 tablet Per Tube Daily  . pantoprazole sodium  40 mg Per Tube Daily  . rocuronium Bromide      . vitamin B-12  2,000 mcg Oral Daily  . [START ON 01/26/2019] vitamin C  1,000 mg Per Tube Daily   Continuous Infusions: . sodium chloride 100 mL/hr at 01/24/19 1649  . aztreonam 2 g (01/25/19 8119)  . clindamycin (CLEOCIN) IV 900 mg (01/25/19 0551)  . fentaNYL infusion  INTRAVENOUS    . magnesium sulfate bolus IVPB    . propofol (DIPRIVAN) infusion    . remdesivir 200 mg in NS 250 mL     Followed by  . [START ON 01/26/2019] remdesivir 100 mg in NS 250 mL    . vancomycin Stopped (01/25/19 1038)   PRN Meds: albuterol, ALPRAZolam, fentaNYL, midazolam Antibiotics:  Anti-infectives (From admission, onward)   Start     Dose/Rate Route Frequency Ordered Stop   01/26/19 1415  remdesivir 100 mg in sodium chloride 0.9 % 250 mL IVPB     100 mg over 30 Minutes Intravenous Every 24 hours 01/25/19 1421 02/04/19 1429   01/25/19 1430  remdesivir 200 mg in sodium chloride 0.9 % 250 mL IVPB     200 mg over 30 Minutes Intravenous Once 01/25/19 1421     01/24/19 2200  vancomycin (VANCOCIN) 1,250 mg in sodium chloride 0.9 % 250 mL IVPB     1,250 mg 166.7 mL/hr over 90 Minutes Intravenous Every 12 hours 01/24/19 1508     01/23/19 1215  aztreonam (AZACTAM) 2 g in sodium chloride 0.9 % 100 mL IVPB  Status:  Discontinued     2 g 200 mL/hr over 30 Minutes Intravenous To Surgery 01/23/19 1207 01/23/19 1350   01/23/19 1200  vancomycin (VANCOCIN) IVPB 1000 mg/200 mL premix  Status:  Discontinued     1,000 mg 200 mL/hr over 60 Minutes Intravenous Every 12 hours 01/22/19 2359 01/24/19 1508   01/23/19 0800  clindamycin (CLEOCIN) IVPB 900 mg     900 mg 100 mL/hr over 30 Minutes Intravenous Every 8 hours 01/23/19 0035     01/23/19 0000  clindamycin (CLEOCIN) IVPB 900 mg     900 mg 100 mL/hr over 30 Minutes Intravenous  Once 01/22/19 2350 01/23/19 0101   01/23/19 0000  aztreonam (AZACTAM) 2 g in sodium chloride 0.9 % 100 mL IVPB     2 g 200 mL/hr over 30 Minutes Intravenous Every 8 hours 01/22/19 2353     01/23/19 0000  vancomycin (VANCOCIN) 1,500 mg in sodium chloride 0.9 % 500 mL IVPB     1,500 mg 250 mL/hr over 120 Minutes Intravenous  Once 01/22/19 2353 01/23/19 1930       Objective: Physical Exam: Vitals:   01/25/19 1415 01/25/19 1430 01/25/19 1445 01/25/19 1500  BP: (!) 159/114 124/70 122/68 122/68  Pulse: (!) 132 (!) 124 (!) 123 (!) 121  Resp: (!) 28 19 (!) 22 (!) 31  Temp: (!) 103.3 F (39.6 C)     TempSrc: Oral     SpO2: 93% 93% 92%   Weight:      Height:        Intake/Output Summary (Last 24 hours) at 01/25/2019 1539 Last data filed at 01/25/2019 0000  Gross per 24 hour  Intake 1775.48 ml  Output -  Net 1775.48 ml   Filed Weights   01/22/19 2009 01/23/19 0430 01/24/19 0600  Weight: 97.5 kg 95.2 kg 103.4 kg   General: Alert, Awake and Oriented to Time, Place and Person. Appear in marked distress, affect flat Eyes: PERRL, Conjunctiva normal ENT: Oral Mucosa clear moist. Neck: no JVD, no Abnormal Mass Or lumps Cardiovascular: S1 and S2 Present, no Murmur, Peripheral Pulses Present Respiratory: normal respiratory effort, Bilateral Air entry equal and Decreased, no use of accessory muscle, Clear to Auscultation, no Crackles, no wheezes Abdomen: Bowel Sound present, Soft and no tenderness, no hernia Skin: no redness, no Rash, no induration Extremities: no  Pedal edema, no calf tenderness Neurologic: Grossly no focal neuro deficit. Bilaterally Equal motor strength  Data Reviewed: CBC: Recent Labs  Lab 01/22/19 2037 01/23/19 0513 01/25/19 0545 01/25/19 1512  WBC 23.4* 25.8* 23.7*  --   NEUTROABS 19.6*  --  19.4*  --   HGB 13.3 12.1  12.3 10.9* 11.2*  HCT 38.7 34.1* 31.4* 33.0*  MCV 80.1 78.4* 79.7*  --   PLT 251 254 257  --    Basic Metabolic Panel: Recent Labs  Lab 01/22/19 2037 01/23/19 0513 01/23/19 1436 01/25/19 0545 01/25/19 1512  NA 127* 136 136 137 140  K 3.3* 3.0* 3.4* 3.2* 3.1*  CL 93* 103 104 103  --   CO2 20* 20* 18* 24  --   GLUCOSE 460* 269* 259* 219*  --   BUN 6 6 6  <5*  --   CREATININE 0.70 0.54 0.59 0.49  --   CALCIUM 8.6* 8.4* 8.3* 8.0*  --   MG  --   --   --  1.4*  --   PHOS  --   --  3.4 3.3  --     Liver Function Tests: Recent Labs  Lab 01/22/19 2037 01/23/19 1436 01/25/19 0545  AST 18  --   --   ALT 23  --   --   ALKPHOS 134*  --   --   BILITOT 0.8  --   --   PROT 6.9  --   --   ALBUMIN 2.7* 2.1* 1.8*   No results for input(s): LIPASE, AMYLASE in the last 168 hours. No results for input(s): AMMONIA in the last 168 hours. Coagulation Profile: Recent Labs  Lab 01/23/19 0019  INR  1.3*   Cardiac Enzymes: Recent Labs  Lab 01/23/19 0513  TROPONINI 0.03*   BNP (last 3 results) No results for input(s): PROBNP in the last 8760 hours. CBG: Recent Labs  Lab 01/24/19 1144 01/24/19 1556 01/24/19 2003 01/25/19 0937 01/25/19 1442  GLUCAP 356* 320* 174* 217* 234*   Studies: [place] Ekg Site Rite  Result Date: 01/25/2019 If Site Rite image not attached, placement could not be confirmed due to current cardiac rhythm.    Time spent: The patient is critically ill with multiple organ systems failure and requires high complexity decision making for assessment and support, frequent evaluation and titration of therapies. Critical Care Time devoted to patient care services described in this note is 35 minutes   Author: Berle Mull, MD [facility] 01/25/2019 3:39 PM  To reach On-call, see care teams to locate the attending and reach out to them via [email or web address]. If 7PM-7AM, please contact night-coverage If you still have difficulty reaching the attending provider, please page the [facility] (Director on Call) for [facility] on amion for assistance.

## 2019-01-25 NOTE — Significant Event (Signed)
Rapid Response Event Note RN called for sats 70's Overview:  on arrival pt sitting upright in bed, RR 50 and shallow, pt warm to touch, sats 74% on NRB, quickly dropped to 64-67% with no activity, gradually increased to 70%. Dr. Posey Pronto, Dr. [name] [name] and Rahul PA at bedside. Decision made to transfer to 25M and intubate.   Initial Focused Assessment:   Interventions: Tranferred to 25M 22g RAW    Event Summary:   at      at          [facility], Sela Hua

## 2019-01-25 NOTE — Progress Notes (Signed)
ET tube initially at 25 with good placement per xray.  Pt from [facility] with ET tube at 28 post transfer.  ET tube placed back at 25 with bilateral breath sounds.  Pt tolerated well. Tube secured with commercial tube holder.

## 2019-01-25 NOTE — Consult Note (Addendum)
NAME:  Maureen Price, MRN:  [id number], DOB:  [birth year], LOS: 2 ADMISSION DATE:  01/22/2019, CONSULTATION DATE:  5/19 REFERRING MD:  Marylyn Ishihara, CHIEF COMPLAINT:  COVID-19 positive w/ progressive hypoxic respiratory failure    Brief History   43 year old female patient poorly controlled diabetes admitted 5/17 with Fournier's gangrene.  Incidentally found to also be positive for COVID-19.  Went to the OR for debridement on 5/18.  Treated initially with antibiotics and IV hydration.  On 5/19 developed worsening hypoxia on 5/20 pulmonary asked to see for progressive respiratory failure  History of present illness   43 year old female with history as mentioned below, who presented to the emergency room on 5/17 with 2-day history of painful boils located on her buttocks and perineum.  Did endorse chills but no fever.  Pain was progressive relenting which is why she eventually presented to the emergency room.  On evaluation CT imaging was obtained this demonstrated subcutaneous emphysema of the soft tissue around the right aspect of anus, perineum and posterior right vulva.  There is no discrete fluid collection appreciated however there was concern for gas-forming infection she was admitted with a working diagnosis of Fournier's gangrene and surgical consultation was obtained. Further evaluation in the emergency room demonstrated the following: White blood cell count 23.4, 1, potassium 3.3, Anion gap metabolic acidosis. COVID-19 positive, Sed rate 79, CRP 27.2  She was brought to the operating room and underwent incision and debridement of necrotizing soft tissue infection involving the perineum and buttocks on 5/18.  She was admitted to the pulmonary unit postoperatively after debridement with plans to trend CRP, ferritin and d-dimer. 5/19: D-dimer down from 1.53-1.50, CRP up from 27.2-29.4, ferritin slightly elevated from 206 to 219 In the p.m. hours around 9:30 PM on 5/19 patient was noted to have  fever of 103.3, room air saturations 65% after walking reported feeling dizzy placed on 10 L facemask.  Over the following 7 hours she continued to complain of worsening shortness of breath, required titration to 15 L to bring saturations from 79% up to 86 to 88%; CRP 24, ferritin up to 2.54 d-dimer up to 2.55.  Asked to see for acute respiratory failure.   Past Medical History  Poorly controlled diabetes with medical noncompliance, asthma, GERD, depression, coronary artery disease with prior stent placement, obesity, and tobacco abuse.  Significant Hospital Events   5/17 admitted with working diagnosis of Fournier's gangrene and associated severe sepsis, incidentally found also be positive for COVID -19.  Started on antibiotics 5/18 went to the OR for incision and debridement 5/19: D-dimer down from 1.53-1.50, CRP up from 27.2-29.4, ferritin slightly elevated from 206 to 219, In the p.m. hours around 9:30 PM on 5/19 patient was noted to have fever of 103.3, room air saturations 65% after walking reported feeling dizzy placed on 10 L facemask.   5/20: Over the following 7 hours she continued to complain of worsening shortness of breath, required titration to 15 L to bring saturations from 79% up to 86 to 88%; CRP 24, ferritin up to 2.54 d-dimer up to 2.55.  Asked to see for acute respiratory failure.     Consults:  Surgical consult 5/17 pccm 5/20  Procedures:  5/18 I&D of peroneum   Significant Diagnostic Tests:  CT abd/plevis 5/17: subcutaneous emphysema of the soft tissue around the right aspect of anus, perineum and posterior right vulva.  There is no discrete fluid collection appreciated however there was concern for gas-forming infection   Micro  Data:  COVID- nineteen 5/18: Positive 5/17: Blood cultures x2>>> 5/18: Fungal stain from abscess>>> 5/18: Fungal culture>>> /18: Abscess culture from deep wound showing few Streptococcus anginosis and rare staph aureus Antimicrobials:    azactam 5/17>>> clinda 5/17>>> vanc 5/17>>> Interim history/subjective:  Worsening respiratory failure   Objective   Blood pressure 130/70, pulse (Abnormal) 124, temperature 98.3 F (36.8 C), temperature source Oral, resp. rate (Abnormal) 50, height 5' 5.5" (1.664 m), weight 103.4 kg, last menstrual period 12/29/2018, SpO2 (Abnormal) 74 %.        Intake/Output Summary (Last 24 hours) at 01/25/2019 1252 Last data filed at 01/25/2019 0000 Gross per 24 hour  Intake 1775.48 ml  Output no documentation  Net 1775.48 ml   Filed Weights   01/22/19 2009 01/23/19 0430 01/24/19 0600  Weight: 97.5 kg 95.2 kg 103.4 kg    Examination: Gen. Pleasant, obese, in mod distress, anxious affect ENT - no pallor,icterus, no post nasal drip, class 2 airway Neck: No JVD, no thyromegaly, no carotid bruits Lungs: no use of accessory muscles, no dullness to percussion, decreased without rales or rhonchi  Cardiovascular: Rhythm regular, heart sounds  normal, no murmurs or gallops, no peripheral edema Abdomen: soft and non-tender, no hepatosplenomegaly, BS normal. Musculoskeletal: No deformities, no cyanosis or clubbing Neuro:  alert, non focal, no tremors Rt perineal wound -packed, no purulent discharge  Chest x-ray 5/18 personally reviewed which shows patchy right-sided infiltrate   Resolved Hospital Problem list     Assessment & Plan:  Acute hypoxic respiratory failure in setting of COVID-19 + related ALI  -progressively hypoxic.  -ddimer and ferritin rising.  -pcxr from 5/18 w/ pathcy R>L airspace disease.  progressively hypoxic Plan Transfer to ICU Intubate/ventilate for acute hypoxic respiratory failure using ARDS protocol -start at PEEP 14 - plat 35 acceptable VAP bundle PAD protocol - deep sedation RASS-5 with propofol/fent Prone if needed Not a candidate for tocilizumab d/t concurrent sepsis from Fournier's gangrene Ck ALT now.  Consider Remdesivir  Prone as needed.  Trend CRP,  ddimer, fibrinogen, LFTs and ferritin daily   Severe sepsis 2/2 Fournier's gangrene status post incision and debridement on 5/18. -Preliminary cultures showing strep and staph organisms  plan Keep euvolemic Continuing current antimicrobials, day 4 of Azactam, clindamycin and vancomycin Transferring to intensive care Trend fever and white blood cell curve Continue wound care as directed by surgery  DM w/ hyperglycemia Plan Hyperglycemia protocol   Fluid and electrolyte imbalance: hypokalemia, hypomagnesemia  Plan Replace K  Anemia of chronic disease Plan Trend CBC Transfuse for hgb < 7 Serial cbcs  Summary -Critically ill due to COVID-19 infection and associated acute lung injury with progressive hypoxic respiratory failure requiring titration of oxygen and now patient transferring to intensive care.  Will consider Remdesivir. Cont supportive care and abx for Fournier's gangrene  Best practice:  Diet: NPO Pain/Anxiety/Delirium protocol (if indicated): 5/20 VAP protocol (if indicated): 5/20 DVT prophylaxis: LMWH GI prophylaxis: PPI Glucose control: ssi Mobility: BR Code Status: full code  Family Communication: pending  Disposition: to [facility] ICU once stabilised for transport, d/w dr [place]ells & dr [place] Pink, [place] from surgical [place]  Labs   CBC: Recent Labs  Lab 01/22/19 2037 01/23/19 0513 01/25/19 0545  WBC 23.4* 25.8* 23.7*  NEUTROABS 19.6*  --  19.4*  HGB 13.3 12.1   12.3 10.9*  HCT 38.7 34.1* 31.4*  MCV 80.1 78.4* 79.7*  PLT 251 254 580    Basic Metabolic Panel: Recent Labs  Lab 01/22/19 2037 01/23/19  8938 01/23/19 1436 01/25/19 0545  NA 127* 136 136 137  K 3.3* 3.0* 3.4* 3.2*  CL 93* 103 104 103  CO2 20* 20* 18* 24  GLUCOSE 460* 269* 259* 219*  BUN 6 6 6  <5*  CREATININE 0.70 0.54 0.59 0.49  CALCIUM 8.6* 8.4* 8.3* 8.0*  MG  --   --   --  1.4*  PHOS  --   --  3.4 3.3   GFR: Estimated Creatinine Clearance: 110.3 mL/min (by C-G formula based on SCr  of 0.49 mg/dL). Recent Labs  Lab 01/22/19 2037 01/22/19 2222 01/23/19 0019 01/23/19 0513 01/25/19 0545  PROCALCITON  --   --  0.24  --   --   WBC 23.4*  --   --  25.8* 23.7*  LATICACIDVEN 1.9 1.2  --   --   --     Liver Function Tests: Recent Labs  Lab 01/22/19 2037 01/23/19 1436 01/25/19 0545  AST 18  --   --   ALT 23  --   --   ALKPHOS 134*  --   --   BILITOT 0.8  --   --   PROT 6.9  --   --   ALBUMIN 2.7* 2.1* 1.8*   No results for input(s): LIPASE, AMYLASE in the last 168 hours. No results for input(s): AMMONIA in the last 168 hours.  ABG    Component Value Date/Time   PHART 7.443 01/24/2019 2145   PCO2ART 39.2 01/24/2019 2145   PO2ART 57.0 (L) 01/24/2019 2145   HCO3 25.6 01/24/2019 2145   TCO2 24 08/18/2015 2225   O2SAT 85.4 01/24/2019 2145     Coagulation Profile: Recent Labs  Lab 01/23/19 0019  INR 1.3*    Cardiac Enzymes: Recent Labs  Lab 01/23/19 0513  TROPONINI 0.03*    HbA1C: Hemoglobin A1C  Date/Time Value Ref Range Status  02/28/2018 08:45 AM 10.5 (A) 4.0 - 5.6 % Final  06/23/2017 10:16 AM 11.7  Final   Hgb A1c MFr Bld  Date/Time Value Ref Range Status  01/23/2019 02:36 PM 11.3 (H) 4.8 - 5.6 % Final    Comment:    (NOTE) Pre diabetes:          5.7%-6.4% Diabetes:              >6.4% Glycemic control for   <7.0% adults with diabetes   09/19/2016 03:46 AM 13.1 (H) 4.8 - 5.6 % Final    Comment:    (NOTE)         Pre-diabetes: 5.7 - 6.4         Diabetes: >6.4         Glycemic control for adults with diabetes: <7.0     CBG: Recent Labs  Lab 01/23/19 2146 01/24/19 0742 01/24/19 1144 01/24/19 1556 01/24/19 2003  GLUCAP 449* 294* 356* 320* 174*    Review of Systems:   Not able  Past Medical History  She,  has a past medical history of Acute myocardial infarction of other anterior wall, initial episode of care, Anginal pain ([place]), Anxiety, Arthritis, Asthma, CAD (coronary artery disease), CHF (congestive heart failure)  ([place]), Depression, Diabetes mellitus, Dyslipidemia, goal LDL below 70 (10/26/2013), Dysrhythmia, GERD (gastroesophageal reflux disease), Headache(784.0), Obesity, Paroxysmal ventricular tachycardia ([place]), Shortness of breath, STEMI (ST elevation myocardial infarction) ([place]), and Tobacco abuse.   Surgical History    Past Surgical History:  Procedure Laterality Date   CESAREAN SECTION     CESAREAN SECTION  [phone]   CHOLECYSTECTOMY  CORONARY ANGIOPLASTY WITH STENT PLACEMENT     GALLBLADDER SURGERY     INCISION AND DRAINAGE PERIRECTAL ABSCESS N/A 01/23/2019   Procedure: IRRIGATION AND DEBRIDEMENT PERIRECTAL ABSCESS;  Surgeon: Rolm Bookbinder, MD;  Location: [place];  Service: General;  Laterality: N/A;   LEFT HEART CATH N/A 10/24/2013   Procedure: LEFT HEART CATH;  Surgeon: Lorretta Harp, MD;  Location: [facility] CATH LAB;  Service: Cardiovascular;  Laterality: N/A;   LEFT HEART CATHETERIZATION WITH CORONARY ANGIOGRAM N/A 10/26/2013   Procedure: LEFT HEART CATHETERIZATION WITH CORONARY ANGIOGRAM;  Surgeon: Burnell Blanks, MD;  Location: [facility] CATH LAB;  Service: Cardiovascular;  Laterality: N/A;   LEFT HEART CATHETERIZATION WITH CORONARY ANGIOGRAM N/A 03/29/2014   Procedure: LEFT HEART CATHETERIZATION WITH CORONARY ANGIOGRAM;  Surgeon: Lorretta Harp, MD;  Location: [facility] CATH LAB;  Service: Cardiovascular;  Laterality: N/A;   TUBAL LIGATION     2000     Social History   reports that she has been smoking cigarettes. She has a 10.00 pack-year smoking history. She has never used smokeless tobacco. She reports that she does not drink alcohol or use drugs.   Family History   Her family history includes Arthritis in her mother; Diabetes in her maternal grandfather and mother; Heart disease in her father; Stroke in her father. There is no history of Cancer, Alcohol abuse, Early death, Kidney disease, Hypertension, Hyperlipidemia, or Thyroid disease.   Allergies Allergies    Allergen Reactions   Erythromycin Anaphylaxis   Penicillins Anaphylaxis   Metformin And Related Other (See Comments)    Diarrhea, upset stomach   Viibryd [Vilazodone Hcl] Other (See Comments)    Altered mental status.     Bee Venom Swelling    Throat swells     Home Medications  Prior to Admission medications   Medication Sig Start Date End Date Taking? Authorizing Provider  ALPRAZolam Duanne Moron) 0.5 MG tablet Take 1 tablet (0.5 mg total) by mouth 2 (two) times daily as needed for anxiety. 01/05/19  Yes [name], MD  aspirin EC 81 MG tablet Take 1 tablet (81 mg total) by mouth daily. 02/28/18  Yes [name], MD  Multiple Vitamin (MULTI-VITAMIN DAILY PO) Take 1 tablet by mouth daily.   Yes [provider]  nitroGLYCERIN (NITROSTAT) 0.4 MG SL tablet Place 1 tablet (0.4 mg total) under the tongue every 5 (five) minutes as needed for chest pain (Hold for SBP <100). 02/28/18  Yes [name], MD  Omega-3 Fatty Acids (FISH OIL PO) Take 1 tablet by mouth daily.   Yes [provider]  Potassium 75 MG TABS Take 1 tablet by mouth daily.   Yes [provider]  vitamin B-12 (CYANOCOBALAMIN) 1000 MCG tablet Take 2,000 mcg by mouth daily.   Yes [provider]  vitamin C (ASCORBIC ACID) 500 MG tablet Take 1,000 mg by mouth daily.   Yes [provider]  dapagliflozin propanediol (FARXIGA) 10 MG TABS tablet Take 10 mg by mouth daily. 01/05/19   [name], MD  Exenatide ER (BYDUREON BCISE) 2 MG/0.85ML AUIJ Inject 1 Act into the skin once a week. 01/05/19   [name], MD  Insulin Degludec (TRESIBA FLEXTOUCH) 200 UNIT/ML SOPN Inject 50 Units into the skin daily. 01/05/19   [name], MD     Critical care time: [place]. [place] [facility] Pager [phone] If no response call [phone]  01/25/2019 ° ° ° ° °

## 2019-01-25 NOTE — Progress Notes (Addendum)
Pharmacy Note  CRICKETT ABBETT is a 43 y.o. female admitted on 01/22/2019 with Fournier's gangrene and COVID positive.  Pharmacy has been consulted for remdesivir dosing. LFTs WNL on 5/17, SCr stable 0.49.  Plan: Remdesivir 200mg  IV x 1; then 100mg  IV q24h Stop date entered for 10 days total Daily CMET and CBC, UA tomorrow AM per protocol Watch LFTs closely, d/c APAP and avoid x 15 days post-therapy  Height: 5' 5.5" (166.4 cm) Weight: 227 lb 15.3 oz (103.4 kg) IBW/kg (Calculated) : 58.15  Temp (24hrs), Avg:100 F (37.8 C), Min:98.3 F (36.8 C), Max:103.3 F (39.6 C)  Recent Labs  Lab 01/22/19 2037 01/22/19 2222 01/23/19 0513 01/23/19 1436 01/25/19 0545  WBC 23.4*  --  25.8*  --  23.7*  CREATININE 0.70  --  0.54 0.59 0.49  LATICACIDVEN 1.9 1.2  --   --   --     Estimated Creatinine Clearance: 110.3 mL/min (by C-G formula based on SCr of 0.49 mg/dL).    Allergies  Allergen Reactions  . Erythromycin Anaphylaxis  . Penicillins Anaphylaxis  . Metformin And Related Other (See Comments)    Diarrhea, upset stomach  . Viibryd [Vilazodone Hcl] Other (See Comments)    Altered mental status.    Raelyn Ensign Venom Swelling    Throat swells   Elicia Lamp, PharmD, BCPS Please check AMION for all [facility] contact numbers Clinical Pharmacist 01/25/2019 2:32 PM

## 2019-01-25 NOTE — Progress Notes (Signed)
Pt on 15L High flow nasal cannula.  Pt taking nasal cannula out at times & o2 falling to 70s-80s.  RN instructed to leave high flow oxygen on at all times.  Pt compliant with continuing to keep oxygen in.

## 2019-01-25 NOTE — Progress Notes (Signed)
Received Pt from [facility] via carelink. Pt stable on ventilator with no complications. VS within normal limits. Pt taken off carelink vent and placed on GV vent.

## 2019-01-25 NOTE — Progress Notes (Signed)
Patient ID: Maureen Price, female   DOB: 03/05/1976, 43 y.o.   MRN: [id number]    2 Days Post-Op  Subjective: Patient just had dressing changed at 0600am this morning.  She is currently prone on 10L HFNC secondary to desats in the 70s-80s overnight.  She still is asymptomatic.    Objective: Vital signs in last 24 hours: Temp:  [98.5 F (36.9 C)-103.3 F (39.6 C)] 99.7 F (37.6 C) (05/20 0326) Pulse Rate:  [80-105] 80 (05/19 1558) Resp:  [18-26] 26 (05/19 2100) BP: (110-131)/(47-74) 110/53 (05/19 2100) SpO2:  [65 %-98 %] 88 % (05/20 0457) Last BM Date: 01/22/19  Intake/Output from previous day: 05/19 0701 - 05/20 0700 In: 1825.5 [P.O.:50; I.V.:1554.5; IV Piggyback:221] Out: -  Intake/Output this shift: No intake/output data recorded.  PE: Skin: wound is overall clean.  Some fibrinous exudate present, but no frank purulent drainage.  Induration along posterior gluteus is much better.  Still very tender.  Lab Results:  Recent Labs    01/23/19 0513 01/25/19 0545  WBC 25.8* 23.7*  HGB 12.1  12.3 10.9*  HCT 34.1* 31.4*  PLT 254 257   BMET Recent Labs    01/23/19 1436 01/25/19 0545  NA 136 137  K 3.4* 3.2*  CL 104 103  CO2 18* 24  GLUCOSE 259* 219*  BUN 6 <5*  CREATININE 0.59 0.49  CALCIUM 8.3* 8.0*   PT/INR Recent Labs    01/23/19 0019  LABPROT 15.8*  INR 1.3*   CMP     Component Value Date/Time   NA 137 01/25/2019 0545   K 3.2 (L) 01/25/2019 0545   CL 103 01/25/2019 0545   CO2 24 01/25/2019 0545   GLUCOSE 219 (H) 01/25/2019 0545   BUN <5 (L) 01/25/2019 0545   CREATININE 0.49 01/25/2019 0545   CREATININE 0.54 03/23/2014 1529   CALCIUM 8.0 (L) 01/25/2019 0545   PROT 6.9 01/22/2019 2037   ALBUMIN 1.8 (L) 01/25/2019 0545   AST 18 01/22/2019 2037   ALT 23 01/22/2019 2037   ALKPHOS 134 (H) 01/22/2019 2037   BILITOT 0.8 01/22/2019 2037   GFRNONAA >60 01/25/2019 0545   GFRAA >60 01/25/2019 0545   Lipase     Component Value Date/Time   LIPASE 18 (L) 01/21/2015 0723       Studies/Results: No results found.  Anti-infectives: Anti-infectives (From admission, onward)   Start     Dose/Rate Route Frequency Ordered Stop   01/24/19 2200  vancomycin (VANCOCIN) 1,250 mg in sodium chloride 0.9 % 250 mL IVPB     1,250 mg 166.7 mL/hr over 90 Minutes Intravenous Every 12 hours 01/24/19 1508     01/23/19 1215  aztreonam (AZACTAM) 2 g in sodium chloride 0.9 % 100 mL IVPB  Status:  Discontinued     2 g 200 mL/hr over 30 Minutes Intravenous To Surgery 01/23/19 1207 01/23/19 1350   01/23/19 1200  vancomycin (VANCOCIN) IVPB 1000 mg/200 mL premix  Status:  Discontinued     1,000 mg 200 mL/hr over 60 Minutes Intravenous Every 12 hours 01/22/19 2359 01/24/19 1508   01/23/19 0800  clindamycin (CLEOCIN) IVPB 900 mg     900 mg 100 mL/hr over 30 Minutes Intravenous Every 8 hours 01/23/19 0035     01/23/19 0000  clindamycin (CLEOCIN) IVPB 900 mg     900 mg 100 mL/hr over 30 Minutes Intravenous  Once 01/22/19 2350 01/23/19 0101   01/23/19 0000  aztreonam (AZACTAM) 2 g in sodium chloride 0.9 % 100  mL IVPB     2 g 200 mL/hr over 30 Minutes Intravenous Every 8 hours 01/22/19 2353     01/23/19 0000  vancomycin (VANCOCIN) 1,500 mg in sodium chloride 0.9 % 500 mL IVPB     1,500 mg 250 mL/hr over 120 Minutes Intravenous  Once 01/22/19 2353 01/23/19 1930       Assessment/Plan POD 2, s/p debridement of Fournier's gangrene  -BID dressing changes today -cont abx therapy -CX: ABUNDANT GRAM NEGATIVE RODS  FEW GRAM VARIABLE ROD  FEW GRAM POSITIVE COCCI  -CBC still 23K although wound overall relatively clean right now and does not require trip back to the OR.  Would like to avoid that given decrease in O2 sats as well. -cont to follow wound  COVID 19 + -per medicine, became more symptomatic overnight with O2 sat drops into the 70-80s.  She is currently prone with HFNC.  Temp overnight of 103.3.  Felt more like to Covid than wound as it is  relatively clean. -stable to go to [facility] although I've heard their beds are full.  FEN - carb mod diet VTE - SCDs/ok for chemical prophylaxis from surgical standpoint ID - vanc, clindamycin   LOS: 2 days    Henreitta Cea , [facility] Surgery 01/25/2019, 8:47 AM Pager: [phone]

## 2019-01-25 NOTE — Progress Notes (Signed)
Patient persistently hypotensive despite decreasing and stopping sedation gtt, MD notified and 1L NS bolus ordered, will administer. Foley ordered by MD for protection of surgical site on groin, placed and clear yellow urine returned. Will continue to monitor.  Candy Sledge, RN

## 2019-01-25 NOTE — Progress Notes (Addendum)
Pt needing to use bathroom & RN assisted with ambulation.  Patient placed on portable oxygen tank & ambulated to bathroom.  After finishing, pt stated feeling short of breath and like not getting oxygen.  RN checked oxygen tank & tank empty.  RN attempted to assist patient back on to toilet while staff brought it another oxygen tank.  Patient stated inability to get back to toilet & RN assisted in helping patient fall onto the bathroom floor.  RN called for help & rapid response.  Pt fatigued, resp rate 30s, oxygen in 70s.  RN placed patient on Non rebreather device.  MD paged.  Staff assisted patient back into bed to further catch breath.  Pt continuing to have oxygen sats in 70s-80s.  MD & Rapid team assessed patient & taken to ICU.  Report given to nurse.

## 2019-01-26 ENCOUNTER — Inpatient Hospital Stay (HOSPITAL_COMMUNITY): Payer: BC Managed Care – PPO

## 2019-01-26 DIAGNOSIS — J9622 Acute and chronic respiratory failure with hypercapnia: Secondary | ICD-10-CM

## 2019-01-26 DIAGNOSIS — F419 Anxiety disorder, unspecified: Secondary | ICD-10-CM

## 2019-01-26 DIAGNOSIS — I2510 Atherosclerotic heart disease of native coronary artery without angina pectoris: Secondary | ICD-10-CM

## 2019-01-26 DIAGNOSIS — J9621 Acute and chronic respiratory failure with hypoxia: Secondary | ICD-10-CM

## 2019-01-26 LAB — COMPREHENSIVE METABOLIC PANEL
ALT: 35 U/L (ref 0–44)
AST: 36 U/L (ref 15–41)
Albumin: 2 g/dL — ABNORMAL LOW (ref 3.5–5.0)
Alkaline Phosphatase: 105 U/L (ref 38–126)
Anion gap: 7 (ref 5–15)
BUN: 11 mg/dL (ref 6–20)
CO2: 24 mmol/L (ref 22–32)
Calcium: 7.4 mg/dL — ABNORMAL LOW (ref 8.9–10.3)
Chloride: 110 mmol/L (ref 98–111)
Creatinine, Ser: 1.12 mg/dL — ABNORMAL HIGH (ref 0.44–1.00)
GFR calc Af Amer: >60 mL/min (ref >60)
GFR calc non Af Amer: >60 mL/min (ref >60)
Glucose, Bld: 77 mg/dL (ref 70–99)
Potassium: 3.2 mmol/L — ABNORMAL LOW (ref 3.5–5.1)
Sodium: 141 mmol/L (ref 135–145)
Total Bilirubin: 0.8 mg/dL (ref 0.3–1.2)
Total Protein: 5.7 g/dL — ABNORMAL LOW (ref 6.5–8.1)

## 2019-01-26 LAB — URINALYSIS, DIPSTICK ONLY
Bilirubin Urine: NEGATIVE
Glucose, UA: NEGATIVE mg/dL
Hgb urine dipstick: NEGATIVE
Ketones, ur: NEGATIVE mg/dL
Nitrite: NEGATIVE
Protein, ur: 100 mg/dL — AB
Specific Gravity, Urine: 1.025 (ref 1.005–1.030)
pH: 5 (ref 5.0–8.0)

## 2019-01-26 LAB — MAGNESIUM
Magnesium: 1.5 mg/dL — ABNORMAL LOW (ref 1.7–2.4)
Magnesium: 1.3 mg/dL — ABNORMAL LOW (ref 1.7–2.4)

## 2019-01-26 LAB — POCT I-STAT 7, (LYTES, BLD GAS, ICA,H+H)
Acid-base deficit: 1 mmol/L (ref 0.0–2.0)
Bicarbonate: 23.1 mmol/L (ref 20.0–28.0)
Calcium, Ion: 1.11 mmol/L — ABNORMAL LOW (ref 1.15–1.40)
HCT: 23 % — ABNORMAL LOW (ref 36.0–46.0)
Hemoglobin: 7.8 g/dL — ABNORMAL LOW (ref 12.0–15.0)
O2 Saturation: 98 %
Potassium: 3.8 mmol/L (ref 3.5–5.1)
Sodium: 141 mmol/L (ref 135–145)
TCO2: 24 mmol/L (ref 22–32)
pCO2 arterial: 34.6 mmHg (ref 32.0–48.0)
pH, Arterial: 7.431 (ref 7.350–7.450)
pO2, Arterial: 110 mmHg — ABNORMAL HIGH (ref 83.0–108.0)

## 2019-01-26 LAB — GLUCOSE, CAPILLARY
Glucose-Capillary: 83 mg/dL (ref 70–99)
Glucose-Capillary: 67 mg/dL — ABNORMAL LOW (ref 70–99)
Glucose-Capillary: 86 mg/dL (ref 70–99)
Glucose-Capillary: 92 mg/dL (ref 70–99)
Glucose-Capillary: 82 mg/dL (ref 70–99)
Glucose-Capillary: 140 mg/dL — ABNORMAL HIGH (ref 70–99)
Glucose-Capillary: 179 mg/dL — ABNORMAL HIGH (ref 70–99)
Glucose-Capillary: 204 mg/dL — ABNORMAL HIGH (ref 70–99)

## 2019-01-26 LAB — CBC
HCT: 25.3 % — ABNORMAL LOW (ref 36.0–46.0)
Hemoglobin: 8.6 g/dL — ABNORMAL LOW (ref 12.0–15.0)
MCH: 28 pg (ref 26.0–34.0)
MCHC: 34 g/dL (ref 30.0–36.0)
MCV: 82.4 fL (ref 80.0–100.0)
Platelets: 241 10*3/uL (ref 150–400)
RBC: 3.07 MIL/uL — ABNORMAL LOW (ref 3.87–5.11)
RDW: 13.8 % (ref 11.5–15.5)
WBC: 18.9 10*3/uL — ABNORMAL HIGH (ref 4.0–10.5)
nRBC: 0 % (ref 0.0–0.2)

## 2019-01-26 LAB — FIBRINOGEN: Fibrinogen: 689 mg/dL — ABNORMAL HIGH (ref 210–475)

## 2019-01-26 LAB — AEROBIC/ANAEROBIC CULTURE W GRAM STAIN (SURGICAL/DEEP WOUND)

## 2019-01-26 LAB — D-DIMER, QUANTITATIVE: D-Dimer, Quant: 2.57 ug/mL-FEU — ABNORMAL HIGH (ref 0.00–0.50)

## 2019-01-26 LAB — ABO/RH: ABO/RH(D): O POS

## 2019-01-26 LAB — PHOSPHORUS: Phosphorus: 3.3 mg/dL (ref 2.5–4.6)

## 2019-01-26 LAB — C-REACTIVE PROTEIN: CRP: 34.7 mg/dL — ABNORMAL HIGH (ref <1.0)

## 2019-01-26 LAB — HEMOGLOBIN AND HEMATOCRIT, BLOOD
HCT: 23.8 % — ABNORMAL LOW (ref 36.0–46.0)
Hemoglobin: 8.3 g/dL — ABNORMAL LOW (ref 12.0–15.0)

## 2019-01-26 MED ORDER — PRO-STAT SUGAR FREE PO LIQD
60.0000 mL | Freq: Three times a day (TID) | ORAL | Status: DC
  Administered 2019-01-26 – 2019-01-29 (×11): 60 mL
  Filled 2019-01-26 (×11): qty 60

## 2019-01-26 MED ORDER — FENTANYL BOLUS VIA INFUSION
50.0000 ug | INTRAVENOUS | Status: DC | PRN
  Administered 2019-01-27 (×2): 50 ug via INTRAVENOUS
  Filled 2019-01-26: qty 50

## 2019-01-26 MED ORDER — VITAL HIGH PROTEIN PO LIQD
1000.0000 mL | ORAL | Status: DC
  Administered 2019-01-26 – 2019-01-29 (×4): 1000 mL

## 2019-01-26 MED ORDER — MAGNESIUM SULFATE 2 GM/50ML IV SOLN
2.0000 g | Freq: Once | INTRAVENOUS | Status: AC
  Administered 2019-01-26: 2 g via INTRAVENOUS

## 2019-01-26 MED ORDER — METHYLPREDNISOLONE SODIUM SUCC 125 MG IJ SOLR
40.0000 mg | Freq: Three times a day (TID) | INTRAMUSCULAR | Status: DC
  Administered 2019-01-26 – 2019-01-27 (×6): 40 mg via INTRAVENOUS
  Filled 2019-01-26 (×5): qty 2

## 2019-01-26 MED ORDER — PRO-STAT SUGAR FREE PO LIQD: 30.0000 mL | Freq: Two times a day (BID) | ORAL | Status: DC

## 2019-01-26 MED ORDER — FENTANYL 2500MCG IN NS 250ML (10MCG/ML) PREMIX INFUSION
50.0000 ug/h | INTRAVENOUS | Status: DC
  Administered 2019-01-26: 100 ug/h via INTRAVENOUS
  Administered 2019-01-27: 200 ug/h via INTRAVENOUS
  Administered 2019-01-28: 175 ug/h via INTRAVENOUS
  Administered 2019-01-28: 150 ug/h via INTRAVENOUS
  Administered 2019-01-29: 50 ug/h via INTRAVENOUS
  Filled 2019-01-26 (×5): qty 250

## 2019-01-26 MED ORDER — SODIUM CHLORIDE 0.9 % IV SOLN
1.0000 g | Freq: Three times a day (TID) | INTRAVENOUS | Status: DC
  Administered 2019-01-26 – 2019-02-06 (×33): 1 g via INTRAVENOUS
  Filled 2019-01-26 (×38): qty 1

## 2019-01-26 MED ORDER — VITAL HIGH PROTEIN PO LIQD: 1000.0000 mL | ORAL | Status: DC

## 2019-01-26 MED ORDER — VANCOMYCIN HCL IN DEXTROSE 750-5 MG/150ML-% IV SOLN
750.0000 mg | Freq: Two times a day (BID) | INTRAVENOUS | Status: DC
  Administered 2019-01-26 – 2019-01-27 (×2): 750 mg via INTRAVENOUS
  Filled 2019-01-26 (×3): qty 150

## 2019-01-26 MED ORDER — PROPOFOL 1000 MG/100ML IV EMUL
0.0000 ug/kg/min | INTRAVENOUS | Status: DC
  Administered 2019-01-26: 23 ug/kg/min via INTRAVENOUS
  Administered 2019-01-26: 23.146 ug/kg/min via INTRAVENOUS
  Administered 2019-01-27 – 2019-01-28 (×4): 25 ug/kg/min via INTRAVENOUS
  Filled 2019-01-26 (×7): qty 100

## 2019-01-26 MED ORDER — SODIUM CHLORIDE 0.9 % IV BOLUS
500.0000 mL | Freq: Once | INTRAVENOUS | Status: AC
  Administered 2019-01-26: 500 mL via INTRAVENOUS

## 2019-01-26 MED ORDER — FENTANYL CITRATE (PF) 100 MCG/2ML IJ SOLN
50.0000 ug | Freq: Once | INTRAMUSCULAR | Status: AC
  Filled 2019-01-26: qty 1

## 2019-01-26 MED ORDER — POTASSIUM CHLORIDE 20 MEQ/15ML (10%) PO SOLN
40.0000 meq | Freq: Once | ORAL | Status: AC
  Administered 2019-01-26: 40 meq
  Filled 2019-01-26: qty 30

## 2019-01-26 MED FILL — Sodium Chloride IV Soln 0.9%: INTRAVENOUS | Qty: 250 | Status: AC

## 2019-01-26 MED FILL — Fentanyl Citrate Preservative Free (PF) Inj 2500 MCG/50ML: INTRAMUSCULAR | Qty: 50 | Status: AC

## 2019-01-26 NOTE — Progress Notes (Signed)
Assisted tele visit to patient with husband.  Claudia Greenley R, RN  

## 2019-01-26 NOTE — Progress Notes (Signed)
Pt recvd from 2W emergently accompanied by Rapid Response team, 2W RN, and RTs. Dr. [name] [name] at bs in 60M. Pt intubated, sedated for vent compliance and improved resp status and preparations made for GVC tx. Report called to [facility] and recving RN. Care Link arriving approx 1730 and update given.

## 2019-01-26 NOTE — Progress Notes (Signed)
Assisted tele visit to patient with mother.  Maureen Price, Philis Nettle, RN

## 2019-01-26 NOTE — Progress Notes (Signed)
Pharmacy Antibiotic Note  Maureen Price is a 43 y.o. female admitted on 01/22/2019 with Fournier's gangrene and COVID pneumonia..  Pharmacy has been consulted for Vancomycin dosing.  SCr increased to 1.12, CrCl ~ 78 ml/min WBC 18.9 (solumedrol) Tm 103.3  Plan: Vancomycin decrease to 750 mg IV q12h. Goal AUC 400-550.  Expected AUC: 534 using SCr 1.12 Continue clindamycin and aztreonam.    Height: 5' 5.5" (166.4 cm) Weight: 227 lb 15.3 oz (103.4 kg) IBW/kg (Calculated) : 58.15  Temp (24hrs), Avg:99.5 F (37.5 C), Min:98.3 F (36.8 C), Max:103.3 F (39.6 C)  Recent Labs  Lab 01/22/19 2037 01/22/19 2222 01/23/19 0513 01/23/19 1436 01/25/19 0545 01/26/19 0341  WBC 23.4*  --  25.8*  --  23.7* 18.9*  CREATININE 0.70  --  0.54 0.59 0.49 1.12*  LATICACIDVEN 1.9 1.2  --   --   --   --     Estimated Creatinine Clearance: 78.8 mL/min (A) (by C-G formula based on SCr of 1.12 mg/dL (H)).    Allergies  Allergen Reactions  . Erythromycin Anaphylaxis  . Penicillins Anaphylaxis  . Metformin And Related Other (See Comments)    Diarrhea, upset stomach  . Viibryd [Vilazodone Hcl] Other (See Comments)    Altered mental status.    . Bee Venom Swelling    Throat swells    Antimicrobials this admission: Vanc 5/17 >> Aztreonam 5/17 >> Clindamycin 5/18 >> Remdesivir 5/20 >>  Dose adjustments this admission: 5/21 Vancomycin empirically decreased d/t increased SCr.   Microbiology results: 5/17 BCx: NGTD 5/18 COVID: positive 5/18 perineum abscess - strep anginosis, mixed aerobic flora (GNR, GVR, GPC) 5/18 perineum abscess tissue - rare staph aureus, holding for possible anaerobe 5/18 Abscess fungal cx  Thank you for allowing pharmacy to be a part of this patient's care.  Gretta Arab PharmD, BCPS 01/26/2019 7:54 AM

## 2019-01-26 NOTE — Plan of Care (Signed)
  Problem: Education: Goal: Knowledge of General Education information will improve Description Including pain rating scale, medication(s)/side effects and non-pharmacologic comfort measures Outcome: Progressing   Problem: Health Behavior/Discharge Planning: Goal: Ability to manage health-related needs will improve Outcome: Progressing   Problem: Clinical Measurements: Goal: Ability to maintain clinical measurements within normal limits will improve Outcome: Progressing Goal: Will remain free from infection Outcome: Progressing Goal: Diagnostic test results will improve Outcome: Progressing Goal: Respiratory complications will improve Outcome: Progressing Goal: Cardiovascular complication will be avoided Outcome: Progressing   Problem: Activity: Goal: Risk for activity intolerance will decrease Outcome: Progressing   Problem: Nutrition: Goal: Adequate nutrition will be maintained Outcome: Progressing   Problem: Elimination: Goal: Will not experience complications related to bowel motility Outcome: Progressing Goal: Will not experience complications related to urinary retention Outcome: Progressing   Problem: Pain Managment: Goal: General experience of comfort will improve Outcome: Progressing   Problem: Safety: Goal: Ability to remain free from injury will improve Outcome: Progressing   Problem: Skin Integrity: Goal: Risk for impaired skin integrity will decrease Outcome: Progressing   Problem: Activity: Goal: Ability to tolerate increased activity will improve Outcome: Progressing   Problem: Respiratory: Goal: Ability to maintain a clear airway and adequate ventilation will improve Outcome: Progressing   Problem: Role Relationship: Goal: Method of communication will improve Outcome: Progressing

## 2019-01-26 NOTE — Progress Notes (Signed)
PCCM Progress Note  Subjective:  Transferred from [facility] to [facility] 5/20.  SpO2 99 to 100%.  Low BP.  Received order for fluid bolus from elink.  BP (!) 80/52   Pulse 91   Temp 98.5 F (36.9 C) (Oral)   Resp (!) 28   Ht 5' 5.5" (1.664 m)   Wt 103.4 kg   LMP 12/29/2018 Comment: s/p BTL  SpO2 97%   BMI 37.36 kg/m   Physical Exam:  General - sedated Eyes - pupils reactive ENT - ETT in place Cardiac - regular rate/rhythm, no murmur Chest - scattered rhonchi Abdomen - soft, non tender, + bowel sounds Extremities - no cyanosis, clubbing, or edema Skin - wound dressings in place Neuro - RASS -2 GU - no lesions noted  Labs: ABG    Component Value Date/Time   PHART 7.322 (L) 01/25/2019 2203   PCO2ART 49.1 (H) 01/25/2019 2203   PO2ART 107.0 01/25/2019 2203   HCO3 25.5 01/25/2019 2203   TCO2 27 01/25/2019 2203   ACIDBASEDEF 1.0 01/25/2019 2203   O2SAT 98.0 01/25/2019 2203   PaO2/FiO2 ratio 153  CMP Latest Ref Rng & Units 01/25/2019 01/25/2019 01/25/2019  Glucose 70 - 99 mg/dL - - 219(H)  BUN 6 - 20 mg/dL - - <5(L)  Creatinine 0.44 - 1.00 mg/dL - - 0.49  Sodium 135 - 145 mmol/L 141 140 137  Potassium 3.5 - 5.1 mmol/L 3.1(L) 3.1(L) 3.2(L)  Chloride 98 - 111 mmol/L - - 103  CO2 22 - 32 mmol/L - - 24  Calcium 8.9 - 10.3 mg/dL - - 8.0(L)  Total Protein 6.5 - 8.1 g/dL - - -  Total Bilirubin 0.3 - 1.2 mg/dL - - -  Alkaline Phos 38 - 126 U/L - - -  AST 15 - 41 U/L - - -  ALT 0 - 44 U/L - - -   CBC Latest Ref Rng & Units 01/25/2019 01/25/2019 01/25/2019  WBC 4.0 - 10.5 K/uL - - 23.7(H)  Hemoglobin 12.0 - 15.0 g/dL 8.5(L) 11.2(L) 10.9(L)  Hematocrit 36.0 - 46.0 % 25.0(L) 33.0(L) 31.4(L)  Platelets 150 - 400 K/uL - - 257   CBG (last 3)  Recent Labs    01/25/19 0937 01/25/19 1442 01/25/19 1944  GLUCAP 217* 234* 163*    Assessment/plan:  Acute hypoxic respiratory failure with ARDS from COVID PNA. Plan - goal SpO2 88 to 95% - don't think she needs proning at this time -  continue remdesivir  Sepsis from Fournier's gangrene and COVID pneumonia. Plan - continue IV fluids - continue ABX  DM type II. Plan - SSI with lantus  CC time 32 minutes  Chesley Mires, MD [place] 01/26/2019, 12:15 AM

## 2019-01-26 NOTE — Progress Notes (Signed)
NAME:  Maureen Price, MRN:  [id number], DOB:  [birth year], LOS: 3 ADMISSION DATE:  01/22/2019, CONSULTATION DATE: May 19 REFERRING MD: Dr. Posey Pronto, CHIEF COMPLAINT: Shortness of breath  Brief History   43 year old female with poorly controlled diabetes initially admitted on May 17 for Fournier's gangrene requiring surgical debridement, found to be COVID positive.  Developed ARDS on May 20 requiring intubation, transferred to [facility] the same day.   Past Medical History  Diabetes mellitus type 2 Asthma Gastroesophageal reflux disease Depression Coronary artery disease Obesity Tobacco abuse  Significant Hospital Events   May 17 admitted for Fournier's gangrene May 18 surgical debridement May 20 worsening hypoxemia, ARDS, intubated, transferred to [place]:  General surgery PCCM  Procedures:  May 18 surgical debridement of gangrenous soft tissue  Significant Diagnostic Tests:  May 17 CT abdomen pelvis subcutaneous emphysema of soft tissue around the right aspect of anus, perineum, posterior right vulva, there is no discrete fluid collection appreciated however there was concern for gas-forming infection  Micro Data:  SARS COV 2 5/18: Positive 5/17: Blood cultures x2>>> 5/18: Fungal stain from abscess>>> 5/18: Fungal culture>>> 5/18: Abscess culture from deep wound showing few Streptococcus anginosis and rare staph aureus  Antimicrobials:  Aztreonam May 17 >  Clindamycin May 17 >  Vancomycin May 17 >  Remdesivir May 19 >   Interim history/subjective:  Moved to ICU last night at [facility] Given remdesivir  Objective   Blood pressure (!) 86/44, pulse 83, temperature 98.6 F (37 C), resp. rate (!) 28, height 5' 5.5" (1.664 m), weight 103.4 kg, last menstrual period 12/29/2018, SpO2 100 %.    Vent Mode: PRVC FiO2 (%):  [60 %-100 %] 60 % Set Rate:  [28 bmp] 28 bmp Vt Set:  [440 mL] 440 mL PEEP:  [12 cmH20-16 cmH20]  12 cmH20 Plateau Pressure:  [25 cmH20-37 cmH20] 32 cmH20   Intake/Output Summary (Last 24 hours) at 01/26/2019 1432 Last data filed at 01/26/2019 0800 Gross per 24 hour  Intake 3855.38 ml  Output 365 ml  Net 3490.38 ml   Filed Weights   01/22/19 2009 01/23/19 0430 01/24/19 0600  Weight: 97.5 kg 95.2 kg 103.4 kg    Examination:  General:  In bed on vent HENT: NCAT ETT in place PULM: CTA B, vent supported breathing CV: RRR, no mgr GI: BS+, soft, nontender MSK: normal bulk and tone Neuro: sedated on vent    Resolved Hospital Problem list     Assessment & Plan:  ARDS due to COVID-19: Oxygenation improved this morning Continue full mechanical ventilatory support with ARDS protocol, target 6 to 8 cc/kg ideal body weight tidal volume, plateau pressure less than 30 PaO2 to FiO2 ratio goal greater than 150, we are there this morning so there is no need to prone Continue IV sedation per PAD protocol titrated to RA SS goal of -1 to -2 Ventilator associated pneumonia prevention protocol Continue diuresis today Monitor urine output closely Remdesivir, solumedrol  General: Start tube feeding  Necrotizing fasciitis: Continue wound care with MD present, wound changes Consider wound consult Continue broad-spectrum antibiotics, would like to narrowed to meropenem/vanc, stop clinda   Best practice:  Diet: start tube feeding Pain/Anxiety/Delirium protocol (if indicated): yes, RASS goal -1 to -2 via PAD protocol VAP protocol (if indicated): yes DVT prophylaxis: lovenox GI prophylaxis: pantoprazole Glucose control: glucose control Mobility: bed rest Code Status: full Family Communication: per [facility] today Disposition: remain in ICU  Labs  CBC: Recent Labs  Lab 01/22/19 2037 01/23/19 0513 01/25/19 0545 01/25/19 1512 01/25/19 2203 01/26/19 0341 01/26/19 1122  WBC 23.4* 25.8* 23.7*  --   --  18.9*  --   NEUTROABS 19.6*  --  19.4*  --   --   --   --   HGB 13.3 12.1  12.3  10.9* 11.2* 8.5* 8.6* 7.8*  HCT 38.7 34.1* 31.4* 33.0* 25.0* 25.3* 23.0*  MCV 80.1 78.4* 79.7*  --   --  82.4  --   PLT 251 254 257  --   --  241  --     Basic Metabolic Panel: Recent Labs  Lab 01/22/19 2037 01/23/19 0513 01/23/19 1436 01/25/19 0545 01/25/19 1512 01/25/19 2203 01/26/19 0341 01/26/19 1122  NA 127* 136 136 137 140 141 141 141  K 3.3* 3.0* 3.4* 3.2* 3.1* 3.1* 3.2* 3.8  CL 93* 103 104 103  --   --  110  --   CO2 20* 20* 18* 24  --   --  24  --   GLUCOSE 460* 269* 259* 219*  --   --  77  --   BUN 6 6 6  <5*  --   --  11  --   CREATININE 0.70 0.54 0.59 0.49  --   --  1.12*  --   CALCIUM 8.6* 8.4* 8.3* 8.0*  --   --  7.4*  --   MG  --   --   --  1.4*  --   --  1.5*  --   PHOS  --   --  3.4 3.3  --   --   --   --    GFR: Estimated Creatinine Clearance: 78.8 mL/min (A) (by C-G formula based on SCr of 1.12 mg/dL (H)). Recent Labs  Lab 01/22/19 2037 01/22/19 2222 01/23/19 0019 01/23/19 0513 01/25/19 0545 01/26/19 0341  PROCALCITON  --   --  0.24  --   --   --   WBC 23.4*  --   --  25.8* 23.7* 18.9*  LATICACIDVEN 1.9 1.2  --   --   --   --     Liver Function Tests: Recent Labs  Lab 01/22/19 2037 01/23/19 1436 01/25/19 0545 01/26/19 0341  AST 18  --   --  36  ALT 23  --   --  35  ALKPHOS 134*  --   --  105  BILITOT 0.8  --   --  0.8  PROT 6.9  --   --  5.7*  ALBUMIN 2.7* 2.1* 1.8* 2.0*   No results for input(s): LIPASE, AMYLASE in the last 168 hours. No results for input(s): AMMONIA in the last 168 hours.  ABG    Component Value Date/Time   PHART 7.431 01/26/2019 1122   PCO2ART 34.6 01/26/2019 1122   PO2ART 110.0 (H) 01/26/2019 1122   HCO3 23.1 01/26/2019 1122   TCO2 24 01/26/2019 1122   ACIDBASEDEF 1.0 01/26/2019 1122   O2SAT 98.0 01/26/2019 1122     Coagulation Profile: Recent Labs  Lab 01/23/19 0019  INR 1.3*    Cardiac Enzymes: Recent Labs  Lab 01/23/19 0513  TROPONINI 0.03*    HbA1C: Hemoglobin A1C  Date/Time Value Ref  Range Status  02/28/2018 08:45 AM 10.5 (A) 4.0 - 5.6 % Final  06/23/2017 10:16 AM 11.7  Final   Hgb A1c MFr Bld  Date/Time Value Ref Range Status  01/23/2019 02:36 PM 11.3 (H) 4.8 - 5.6 %  Final    Comment:    (NOTE) Pre diabetes:          5.7%-6.4% Diabetes:              >6.4% Glycemic control for   <7.0% adults with diabetes   09/19/2016 03:46 AM 13.1 (H) 4.8 - 5.6 % Final    Comment:    (NOTE)         Pre-diabetes: 5.7 - 6.4         Diabetes: >6.4         Glycemic control for adults with diabetes: <7.0     CBG: Recent Labs  Lab 01/26/19 0038 01/26/19 0335 01/26/19 0809 01/26/19 1027 01/26/19 1133  GLUCAP 83 82 67* 86 92     Critical care time: 35 minutes     Roselie Awkward, MD [place] PCCM Pager: [phone] Cell: [phone] If no response, call [phone]

## 2019-01-26 NOTE — Procedures (Signed)
Intubation Procedure Note Maureen Price [id number] 07/18/1976  Procedure: Intubation Indications: Respiratory insufficiency  Procedure Details Consent: Risks of procedure as well as the alternatives and risks of each were explained to the (patient/caregiver).  Consent for procedure obtained. Time Out: Verified patient identification, verified procedure, site/side was marked, verified correct patient position, special equipment/implants available, medications/allergies/relevent history reviewed, required imaging and test results available.  Performed  Maximum sterile technique was used including cap, gloves, gown, hand hygiene and mask.  MAC and 4  Versed 2 Fentanyl 100 Etomidate 20 Rocuronium 50   Evaluation Hemodynamic Status: BP stable throughout; O2 sats: transiently fell during during procedure and currently acceptable Patient's Current Condition: stable Complications: No apparent complications Patient did tolerate procedure well. Chest X-ray ordered to verify placement.  CXR: pending.   Maureen Price 01/26/2019

## 2019-01-26 NOTE — Progress Notes (Signed)
Initial Nutrition Assessment RD working remotely.  DOCUMENTATION CODES:   Obesity unspecified  INTERVENTION:   Initiate via OG tube Vital High Protein @ 25 ml/hr 60 ml Prostat TID  Provides: 1200 kcal, 142 grams protein, and 501 ml free water TF regimen and propofol at current rate providing 1585 total kcal/day   NUTRITION DIAGNOSIS:   Increased nutrient needs related to wound healing, acute illness(COVID) as evidenced by estimated needs.  GOAL:   Provide needs based on ASPEN/SCCM guidelines  MONITOR:   TF tolerance, Skin  REASON FOR ASSESSMENT:   Consult, Ventilator Enteral/tube feeding initiation and management  ASSESSMENT:   Pt with PMH of asthma, poorly controlled type II DM, GERD, depression, CAD, active smoker, obesity admitted 5/17 with sepsis,  Fournier's gangrene s/p I&D 5/18. Post op became hypoxic, COVID test positive. Pt tx to [facility] and intubated.    Patient is currently intubated on ventilator support MV: 12.6 L/min Temp (24hrs), Avg:99.6 F (37.6 C), Min:98.5 F (36.9 C), Max:103.3 F (39.6 C) MAP: 58-82  Propofol: 14.6 ml/hr provides: 385 kcal   Medications reviewed and include: SSI, solumedrol, MVI, vitamin B12 2000 mg daily, vitamin C 1000 mg daily, remdesivir   Labs reviewed: K+ 3.2 (L), magnesium 1.5 (L) - given one dose IV, CRP: 34.7 (H), Ferritin WNL  UOP: 325 ml I&O: +10 L Admission weight: 95.2 kg - weight up 6 kg    Lab Results  Component Value Date   HGBA1C 11.3 (H) 01/23/2019     NUTRITION - FOCUSED PHYSICAL EXAM:  Deferred   Diet Order:   Diet Order    None      EDUCATION NEEDS:   No education needs have been identified at this time  Skin:  Skin Assessment: Skin Integrity Issues: Skin Integrity Issues:: Other (Comment) Other: Fournier's gangrene of perienum  Last BM:  5/17  Height:   Ht Readings from Last 1 Encounters:  01/22/19 5' 5.5" (1.664 m)    Weight:   Wt Readings from Last 1 Encounters:   01/24/19 103.4 kg    Ideal Body Weight:  57.9 kg  BMI:  Body mass index is 37.36 kg/m.  Estimated Nutritional Needs:   Kcal:  1100-1400  Protein:  115 - 145 grams  Fluid:  > 1.5 L/day  [place], LDN, CNSC [phone] Pager [phone] After Hours Pager

## 2019-01-26 NOTE — Progress Notes (Addendum)
PROGRESS NOTE  Maureen Price DVV:[id number] DOB: 06/02/1976 DOA: 01/22/2019  PCP: [name], MD  Brief History/Interval Summary: Pt. with PMH of Asthma, poorly controlled type II DM, GERD, depression, CAD S/P PCI, active smoker, obesity; admitted on 01/22/2019, presented with complaint of perineal pain, was found to have Fournier's gangrene.  S/P incision and debridement 01/23/2019.  Her SARS-CoV-2 was positive.  01/25/2019 patient comes more hypoxic unable to maintain adequate saturation on nonrebreather.    She had to be transferred to the ICU.  She was intubated and then subsequently transferred to the [facility] [place] [facility].    Reason for Visit: Acute respiratory failure with hypoxia secondary to COVID-19  Consultants: Pulmonology.  General surgery has been following  Procedures: Incision and debridement of necrotizing soft tissue in the perineum and buttock area on 5/18  Antibiotics: Anti-infectives (From admission, onward)   Start     Dose/Rate Route Frequency Ordered Stop   01/26/19 1415  remdesivir 100 mg in sodium chloride 0.9 % 250 mL IVPB     100 mg over 30 Minutes Intravenous Every 24 hours 01/25/19 1421 02/04/19 1429   01/25/19 1430  remdesivir 200 mg in sodium chloride 0.9 % 250 mL IVPB     200 mg over 30 Minutes Intravenous Once 01/25/19 1421 01/25/19 1949   01/24/19 2200  vancomycin (VANCOCIN) 1,250 mg in sodium chloride 0.9 % 250 mL IVPB  Status:  Discontinued     1,250 mg 166.7 mL/hr over 90 Minutes Intravenous Every 12 hours 01/24/19 1508 01/26/19 1046   01/23/19 1215  aztreonam (AZACTAM) 2 g in sodium chloride 0.9 % 100 mL IVPB  Status:  Discontinued     2 g 200 mL/hr over 30 Minutes Intravenous To Surgery 01/23/19 1207 01/23/19 1350   01/23/19 1200  vancomycin (VANCOCIN) IVPB 1000 mg/200 mL premix  Status:  Discontinued     1,000 mg 200 mL/hr over 60 Minutes Intravenous Every 12 hours 01/22/19 2359 01/24/19 1508   01/23/19 0800  clindamycin (CLEOCIN) IVPB 900  mg     900 mg 100 mL/hr over 30 Minutes Intravenous Every 8 hours 01/23/19 0035     01/23/19 0000  clindamycin (CLEOCIN) IVPB 900 mg     900 mg 100 mL/hr over 30 Minutes Intravenous  Once 01/22/19 2350 01/23/19 0101   01/23/19 0000  aztreonam (AZACTAM) 2 g in sodium chloride 0.9 % 100 mL IVPB     2 g 200 mL/hr over 30 Minutes Intravenous Every 8 hours 01/22/19 2353     01/23/19 0000  vancomycin (VANCOCIN) 1,500 mg in sodium chloride 0.9 % 500 mL IVPB     1,500 mg 250 mL/hr over 120 Minutes Intravenous  Once 01/22/19 2353 01/23/19 1930       Subjective/Interval History: Patient intubated and sedated.  She does open her eyes on tactile stimulation.   Assessment/Plan:  Acute Hypoxic Resp. Failure due to Acute Covid 19  Vent Mode: PRVC FiO2 (%):  [60 %-100 %] 60 % Set Rate:  [28 bmp] 28 bmp Vt Set:  [440 mL] 440 mL PEEP:  [12 cmH20-16 cmH20] 12 cmH20 Plateau Pressure:  [25 cmH20-40 cmH20] 32 cmH20     Component Value Date/Time   PHART 7.431 01/26/2019 1122   PCO2ART 34.6 01/26/2019 1122   PO2ART 110.0 (H) 01/26/2019 1122   HCO3 23.1 01/26/2019 1122   TCO2 24 01/26/2019 1122   ACIDBASEDEF 1.0 01/26/2019 1122   O2SAT 98.0 01/26/2019 1122    COVID-19 Labs  Recent Labs  01/24/19 0525 01/25/19 0545 01/26/19 0341  DDIMER 1.50* 2.55* 2.57*  FERRITIN 219 254  --   CRP 29.4* 24.0* 34.7*    Lab Results  Component Value Date   SARSCOV2NAA POSITIVE (A) 01/23/2019     Fever: Patient had a temperature of 103.3 F on 5/20. Oxygen requirements: On mechanical ventilation.  60% FiO2 saturating in the 90s to 100s Antibiotics: On vancomycin aztreonam and clindamycin Steroids: Will initiate Solu-Medrol Diuretics: Not on diuretics Remdesivir: Initiated on 5/20 Actemra: Not given due to recent severe infection (Fournier's) Convalescent Plasma: Not yet Vitamin C and Zinc: On vitamin C   Patient remains mechanically ventilated.  Pulmonology is following.  ABG this morning  shows PaO2 of 110.  Continue to wean down FiO2.  Patient not given Actemra due to recent severe infection in the form of Fournier's gangrene.  She was started on Solu-Medrol this morning.  She has been initiated on Remdesivir as well.  Inflammatory markers showed rise in CRP.  D-dimer is noted to be elevated.  Patient is on higher dose of Lovenox per COVID ICU protocol.  Fournier's gangrene/sepsis present on admission Patient was seen by general surgery.  She underwent surgical debridement on 5/18.  Underwent dressing changes this morning.  Wound appears to be healing according to nursing staff.  Patient is on broad-spectrum coverage with vancomycin aztreonam and clindamycin.  Cultures from the wound grew strep angina gnosis as well as rare staph aureus.  WBC is noted to be elevated at 18.9 although it has been trending down.  Pro calcitonin was 0.24 on 5/18.  We will recheck tomorrow.  Will discuss with infectious disease regarding course of antibiotics depending on final culture report.  Diabetes mellitus type 2 uncontrolled with peripheral vascular disease Apparently patient is noncompliant with insulin.  She was hypoglycemic on admission.  Sugars again noted to be low this morning.  We will hold her long-acting insulin for now.  She has been started on steroids of the anticipate some rise in blood glucose levels.  HbA1c 11.3.  Normocytic anemia Drop in hemoglobin is noted.  No evidence of overt bleeding.  She is on Lovenox.  We will continue to monitor for now.  Drop in hemoglobin probably due to acute illness and hemodilution.  Recheck hemoglobin later today.  Anemia panel in the morning.  Patient with known history of B12 deficiency.  Mild acute renal failure with hypokalemia and hypomagnesemia Slight rise in creatinine noted this morning.  Monitor urine output.  Replace potassium and magnesium.  Coronary artery disease Stable.  She also has a history of peripheral vascular disease.  Apparently  history of PCI as well.  On aspirin.  Plavix was placed on hold.  Could reinitiate soon depending on her wound.  History of asthma Currently stable.  Morbid obesity BMI 37.36.  History of active smoking Will need counseling eventually  FEN Patient to be given IV fluid boluses for low blood pressure.  Also for renal failure as discussed above.  Electrolytes to be repleted as discussed above.  Tube feedings initiated.   DVT Prophylaxis: On higher dose of Lovenox per COVID ICU protocol  Lab Results  Component Value Date   PLT 241 01/26/2019     PUD Prophylaxis: Protonix Code Status: Full code Family Communication: PCCM to discuss with family Disposition Plan: Will remain in ICU    Medications:  Scheduled: . aspirin  81 mg Per Tube Daily  . chlorhexidine gluconate (MEDLINE KIT)  15 mL Mouth Rinse BID  .  Chlorhexidine Gluconate Cloth  6 each Topical Daily  . enoxaparin (LOVENOX) injection  0.5 mg/kg Subcutaneous Q12H  . feeding supplement (PRO-STAT SUGAR FREE 64)  30 mL Per Tube BID  . feeding supplement (VITAL HIGH PROTEIN)  1,000 mL Per Tube Q24H  . fentaNYL (SUBLIMAZE) injection  100 mcg Intravenous Once  . fentaNYL (SUBLIMAZE) injection  50 mcg Intravenous Once  . insulin aspart  0-20 Units Subcutaneous Q4H  . mouth rinse  15 mL Mouth Rinse 10 times per day  . methylPREDNISolone (SOLU-MEDROL) injection  40 mg Intravenous Q8H  . multivitamin with minerals  1 tablet Per Tube Daily  . pantoprazole sodium  40 mg Per Tube Daily  . rocuronium  80 mg Intravenous Once  . sodium chloride flush  10-40 mL Intracatheter Q12H  . vitamin B-12  2,000 mcg Oral Daily  . vitamin C  1,000 mg Per Tube Daily   Continuous: . sodium chloride 100 mL/hr at 01/26/19 0700  . aztreonam Stopped (01/26/19 8185)  . clindamycin (CLEOCIN) IV Stopped (01/26/19 0606)  . fentaNYL infusion INTRAVENOUS 100 mcg/hr (01/26/19 1151)  . magnesium sulfate bolus IVPB    . propofol (DIPRIVAN) infusion 23.533  mcg/kg/min (01/26/19 1152)  . remdesivir 100 mg in NS 250 mL     UDJ:SHFWYOVZC, ALPRAZolam, fentaNYL, midazolam, sodium chloride flush   Objective:  Vital Signs  Vitals:   01/26/19 0731 01/26/19 0800 01/26/19 0900 01/26/19 1000  BP: (!) 105/58 (!) 101/59 114/64 (!) 86/44  Pulse: 79 78 85 83  Resp: (!) 27 (!) 28 (!) 28 (!) 28  Temp:  98.6 F (37 C)    TempSrc:      SpO2: 100% 100% 100% 100%  Weight:      Height:        Intake/Output Summary (Last 24 hours) at 01/26/2019 1209 Last data filed at 01/26/2019 0800 Gross per 24 hour  Intake 3855.38 ml  Output 365 ml  Net 3490.38 ml   Filed Weights   01/22/19 2009 01/23/19 0430 01/24/19 0600  Weight: 97.5 kg 95.2 kg 103.4 kg    General appearance: Intubated sedated. Resp: Coarse breath sounds bilaterally.  No wheezing heard. Cardio: S1-S2 is normal regular.  No S3-S4.  No rubs murmurs or bruit GI: Abdomen is soft.  Nontender nondistended.  Bowel sounds are present normal.  No masses organomegaly Dressing noted over the perineal area Extremities: No edema.   Neurologic: Intubated and sedated.   Lab Results:  Data Reviewed: I have personally reviewed following labs and imaging studies  CBC: Recent Labs  Lab 01/22/19 2037 01/23/19 0513 01/25/19 0545 01/25/19 1512 01/25/19 2203 01/26/19 0341 01/26/19 1122  WBC 23.4* 25.8* 23.7*  --   --  18.9*  --   NEUTROABS 19.6*  --  19.4*  --   --   --   --   HGB 13.3 12.1  12.3 10.9* 11.2* 8.5* 8.6* 7.8*  HCT 38.7 34.1* 31.4* 33.0* 25.0* 25.3* 23.0*  MCV 80.1 78.4* 79.7*  --   --  82.4  --   PLT 251 254 257  --   --  241  --     Basic Metabolic Panel: Recent Labs  Lab 01/22/19 2037 01/23/19 0513 01/23/19 1436 01/25/19 0545 01/25/19 1512 01/25/19 2203 01/26/19 0341 01/26/19 1122  NA 127* 136 136 137 140 141 141 141  K 3.3* 3.0* 3.4* 3.2* 3.1* 3.1* 3.2* 3.8  CL 93* 103 104 103  --   --  110  --  CO2 20* 20* 18* 24  --   --  24  --   GLUCOSE 460* 269* 259* 219*   --   --  77  --   BUN 6 6 6  <5*  --   --  11  --   CREATININE 0.70 0.54 0.59 0.49  --   --  1.12*  --   CALCIUM 8.6* 8.4* 8.3* 8.0*  --   --  7.4*  --   MG  --   --   --  1.4*  --   --  1.5*  --   PHOS  --   --  3.4 3.3  --   --   --   --     GFR: Estimated Creatinine Clearance: 78.8 mL/min (A) (by C-G formula based on SCr of 1.12 mg/dL (H)).  Liver Function Tests: Recent Labs  Lab 01/22/19 2037 01/23/19 1436 01/25/19 0545 01/26/19 0341  AST 18  --   --  36  ALT 23  --   --  35  ALKPHOS 134*  --   --  105  BILITOT 0.8  --   --  0.8  PROT 6.9  --   --  5.7*  ALBUMIN 2.7* 2.1* 1.8* 2.0*     Coagulation Profile: Recent Labs  Lab 01/23/19 0019  INR 1.3*    Cardiac Enzymes: Recent Labs  Lab 01/23/19 0513  TROPONINI 0.03*    HbA1C: Recent Labs    01/23/19 1436  HGBA1C 11.3*    CBG: Recent Labs  Lab 01/25/19 1944 01/26/19 0038 01/26/19 0809 01/26/19 1027 01/26/19 1133  GLUCAP 163* 83 67* 86 92    Lipid Profile: Recent Labs    01/25/19 1445  TRIG 116    Anemia Panel: Recent Labs    01/24/19 0525 01/25/19 0545  FERRITIN 219 254    Recent Results (from the past 240 hour(s))  Culture, blood (routine x 2)     Status: None (Preliminary result)   Collection Time: 01/22/19  8:40 PM  Result Value Ref Range Status   Specimen Description BLOOD LEFT HAND  Final   Special Requests   Final    BOTTLES DRAWN AEROBIC AND ANAEROBIC Blood Culture adequate volume   Culture   Final    NO GROWTH 4 DAYS Performed at [facility], [place] [address], [place], [place] [address]    Report Status PENDING  Incomplete  Culture, blood (routine x 2)     Status: None (Preliminary result)   Collection Time: 01/22/19  8:58 PM  Result Value Ref Range Status   Specimen Description BLOOD RIGHT ANTECUBITAL  Final   Special Requests   Final    BOTTLES DRAWN AEROBIC ONLY Blood Culture adequate volume   Culture   Final    NO GROWTH 4 DAYS Performed at [facility], [place] [address] [place], [place] [address]    Report Status PENDING  Incomplete  SARS Coronavirus 2 (CEPHEID - Performed in [place] hospital lab), Hosp Order     Status: Abnormal   Collection Time: 01/23/19  1:19 AM  Result Value Ref Range Status   SARS Coronavirus 2 POSITIVE (A) NEGATIVE Final    Comment: RESULT CALLED TO, READ BACK BY AND VERIFIED WITH: R FITZGERAKD ANSTI @0235  01/23/19 BY S GEZAHEGN (NOTE) If result is NEGATIVE SARS-CoV-2 target nucleic acids are NOT DETECTED. The SARS-CoV-2 RNA is generally detectable in upper and lower  respiratory specimens during the acute phase  of infection. The lowest  concentration of SARS-CoV-2 viral copies this assay can detect is 250  copies / mL. A negative result does not preclude SARS-CoV-2 infection  and should not be used as the sole basis for treatment or other  patient management decisions.  A negative result may occur with  improper specimen collection / handling, submission of specimen other  than nasopharyngeal swab, presence of viral mutation(s) within the  areas targeted by this assay, and inadequate number of viral copies  (<250 copies / mL). A negative result must be combined with clinical  observations, patient history, and epidemiological information. If result is POSITIVE SARS-CoV-2 target nucleic acids are D ETECTED. The SARS-CoV-2 RNA is generally detectable in upper and lower  respiratory specimens during the acute phase of infection.  Positive  results are indicative of active infection with SARS-CoV-2.  Clinical  correlation with patient history and other diagnostic information is  necessary to determine patient infection status.  Positive results do  not rule out bacterial infection or co-infection with other viruses. If result is PRESUMPTIVE POSTIVE SARS-CoV-2 nucleic acids MAY BE PRESENT.   A presumptive positive result was obtained on the submitted specimen  and confirmed on repeat testing.  While  2019 novel coronavirus  (SARS-CoV-2) nucleic acids may be present in the submitted sample  additional confirmatory testing may be necessary for epidemiological  and / or clinical management purposes  to differentiate between  SARS-CoV-2 and other Sarbecovirus currently known to infect humans.  If clinically indicated additional testing with an alternate test  methodology [phone]) is advised. The SARS-CoV-2 RNA is generally  detectable in upper and lower respiratory specimens during the acute  phase of infection. The expected result is Negative. Fact Sheet for Patients:  StrictlyIdeas.no Fact Sheet for Healthcare Providers: BankingDealers.co.za This test is not yet approved or cleared by the [place] FDA and has been authorized for detection and/or diagnosis of SARS-CoV-2 by FDA under an Emergency Use Authorization (EUA).  This EUA will remain in effect (meaning this test can be used) for the duration of the COVID-19 declaration under Section 564(b)(1) of the Act, 21 U.S.C. section 360bbb-3(b)(1), unless the authorization is terminated or revoked sooner. Performed at [facility], [place] [address] [place], [place] [address]   Aerobic/Anaerobic Culture (surgical/deep wound)     Status: None (Preliminary result)   Collection Time: 01/23/19 12:16 PM  Result Value Ref Range Status   Specimen Description ABSCESS PERINEUM  Final   Special Requests PATIENT ON FOLLOWING CLEOCIN AND AZACTAM  Final   Gram Stain   Final    ABUNDANT WBC PRESENT,BOTH PMN AND MONONUCLEAR ABUNDANT GRAM NEGATIVE RODS MODERATE GRAM VARIABLE ROD FEW GRAM POSITIVE COCCI Performed at [facility], [place] [address], [place], [place] [address]    Culture   Final    FEW STREPTOCOCCUS ANGINOSIS MIXED ANAEROBIC FLORA PRESENT.  CALL LAB IF FURTHER IID REQUIRED.    Report Status PENDING  Incomplete  Fungus Culture With Stain     Status: None (Preliminary  result)   Collection Time: 01/23/19 12:41 PM  Result Value Ref Range Status   Fungus Stain Final report  Final    Comment: (NOTE) Performed At: [facility] [place], [place] [id number] Rush Farmer MD RK:[id number]    Fungus (Mycology) Culture PENDING  Incomplete   Fungal Source TISSUE  Final    Comment: PERINEUM Performed at [facility], [place] [address] [place], [place] [address]  Aerobic/Anaerobic Culture (surgical/deep wound)     Status: None (Preliminary result)   Collection Time: 01/23/19 12:41 PM  Result Value Ref Range Status   Specimen Description TISSUE PERINEUM  Final   Special Requests PATIENT ON FOLLOWING CLEOCIN AND AZACTAM  Final   Gram Stain   Final    MODERATE WBC PRESENT,BOTH PMN AND MONONUCLEAR ABUNDANT GRAM NEGATIVE RODS FEW GRAM VARIABLE ROD FEW GRAM POSITIVE COCCI    Culture   Final    RARE STAPHYLOCOCCUS AUREUS RARE STREPTOCOCCUS ANGINOSIS HOLDING FOR POSSIBLE ANAEROBE Performed at [facility], [place] [address], [place], [place] [address]    Report Status PENDING  Incomplete   Organism ID, Bacteria STREPTOCOCCUS ANGINOSIS  Final      Susceptibility   Streptococcus anginosis - MIC*    PENICILLIN <=0.06 SENSITIVE Sensitive     CEFTRIAXONE 0.5 SENSITIVE Sensitive     ERYTHROMYCIN >=8 RESISTANT Resistant     LEVOFLOXACIN 0.5 SENSITIVE Sensitive     VANCOMYCIN 0.5 SENSITIVE Sensitive     * RARE STREPTOCOCCUS ANGINOSIS  Fungus Culture Result     Status: None   Collection Time: 01/23/19 12:41 PM  Result Value Ref Range Status   Result 1 Comment  Final    Comment: (NOTE) KOH/Calcofluor preparation:  no fungus observed. Performed At: [facility] [place], [place] [id number] Rush Farmer MD BR:[id number]       Radiology Studies: Dg Chest Port 1 View  Result Date: 01/26/2019 CLINICAL DATA:  Acute respiratory failure with hypoxia EXAM: PORTABLE CHEST 1 VIEW COMPARISON:  01/25/2019  FINDINGS: Cardiac shadow is within normal limits. Endotracheal tube is noted just above the carina. Gastric catheter and PICC line on the right are seen and stable. Patchy infiltrates are again identified and stable. No new focal abnormality is seen. IMPRESSION: Stable patchy infiltrates bilaterally. Tubes and lines as described. Electronically Signed   By: Inez Catalina M.D.   On: 01/26/2019 07:39   Dg Chest Port 1 View  Result Date: 01/25/2019 CLINICAL DATA:  Endotracheal tube positioning. EXAM: PORTABLE CHEST 1 VIEW COMPARISON:  01/23/2019 FINDINGS: The endotracheal tube terminates in the right mainstem bronchus. The right-sided PICC line is well position. Enteric tube terminates below the left hemidiaphragm. The lung volumes are low. There are diffuse hazy bilateral airspace opacities which have worsened since the prior study. There is no pneumothorax. The cardiac silhouette is stable. There is worsening retrocardiac consolidation. IMPRESSION: 1. Endotracheal tube terminates in the right mainstem bronchus. Repositioning is recommended. 2. The right-sided PICC line is well positioned. The enteric tube terminates below the left hemidiaphragm. 3. Diffuse hazy bilateral airspace opacities, significantly worsened since prior study. There is new retrocardiac consolidation. Findings can be seen in patients with pulmonary edema or an atypical infectious process. Developing ARDS can have a similar appearance. These results will be called to the ordering clinician or representative by the Radiologist Assistant, and communication documented in the PACS or zVision Dashboard. Electronically Signed   By: Constance Holster M.D.   On: 01/25/2019 17:51   [place] Ekg Site Rite  Result Date: 01/25/2019 If Site Rite image not attached, placement could not be confirmed due to current cardiac rhythm.      LOS: 3 days   Krystol Rocco [employer] on [email or web address]  01/26/2019, 12:09 PM

## 2019-01-27 ENCOUNTER — Inpatient Hospital Stay (HOSPITAL_COMMUNITY): Payer: BC Managed Care – PPO

## 2019-01-27 LAB — COMPREHENSIVE METABOLIC PANEL
ALT: 35 U/L (ref 0–44)
AST: 24 U/L (ref 15–41)
Albumin: 1.7 g/dL — ABNORMAL LOW (ref 3.5–5.0)
Alkaline Phosphatase: 110 U/L (ref 38–126)
Anion gap: 11 (ref 5–15)
BUN: 34 mg/dL — ABNORMAL HIGH (ref 6–20)
CO2: 19 mmol/L — ABNORMAL LOW (ref 22–32)
Calcium: 6.8 mg/dL — ABNORMAL LOW (ref 8.9–10.3)
Chloride: 112 mmol/L — ABNORMAL HIGH (ref 98–111)
Creatinine, Ser: 1.52 mg/dL — ABNORMAL HIGH (ref 0.44–1.00)
GFR calc Af Amer: 49 mL/min — ABNORMAL LOW (ref >60)
GFR calc non Af Amer: 42 mL/min — ABNORMAL LOW (ref >60)
Glucose, Bld: 230 mg/dL — ABNORMAL HIGH (ref 70–99)
Potassium: 3.2 mmol/L — ABNORMAL LOW (ref 3.5–5.1)
Sodium: 142 mmol/L (ref 135–145)
Total Bilirubin: 0.2 mg/dL — ABNORMAL LOW (ref 0.3–1.2)
Total Protein: 5.5 g/dL — ABNORMAL LOW (ref 6.5–8.1)

## 2019-01-27 LAB — POCT I-STAT 7, (LYTES, BLD GAS, ICA,H+H)
Acid-base deficit: 4 mmol/L — ABNORMAL HIGH (ref 0.0–2.0)
Bicarbonate: 21.2 mmol/L (ref 20.0–28.0)
Calcium, Ion: 1.11 mmol/L — ABNORMAL LOW (ref 1.15–1.40)
HCT: 25 % — ABNORMAL LOW (ref 36.0–46.0)
Hemoglobin: 8.5 g/dL — ABNORMAL LOW (ref 12.0–15.0)
O2 Saturation: 98 %
Patient temperature: 98.6
Potassium: 3.5 mmol/L (ref 3.5–5.1)
Sodium: 142 mmol/L (ref 135–145)
TCO2: 22 mmol/L (ref 22–32)
pCO2 arterial: 39.1 mmHg (ref 32.0–48.0)
pH, Arterial: 7.341 — ABNORMAL LOW (ref 7.350–7.450)
pO2, Arterial: 109 mmHg — ABNORMAL HIGH (ref 83.0–108.0)

## 2019-01-27 LAB — GLUCOSE, CAPILLARY
Glucose-Capillary: 217 mg/dL — ABNORMAL HIGH (ref 70–99)
Glucose-Capillary: 200 mg/dL — ABNORMAL HIGH (ref 70–99)
Glucose-Capillary: 243 mg/dL — ABNORMAL HIGH (ref 70–99)
Glucose-Capillary: 254 mg/dL — ABNORMAL HIGH (ref 70–99)
Glucose-Capillary: 232 mg/dL — ABNORMAL HIGH (ref 70–99)

## 2019-01-27 LAB — CBC
HCT: 24.3 % — ABNORMAL LOW (ref 36.0–46.0)
Hemoglobin: 8.3 g/dL — ABNORMAL LOW (ref 12.0–15.0)
MCH: 28.3 pg (ref 26.0–34.0)
MCHC: 34.2 g/dL (ref 30.0–36.0)
MCV: 82.9 fL (ref 80.0–100.0)
Platelets: 271 10*3/uL (ref 150–400)
RBC: 2.93 MIL/uL — ABNORMAL LOW (ref 3.87–5.11)
RDW: 14.2 % (ref 11.5–15.5)
WBC: 20.7 10*3/uL — ABNORMAL HIGH (ref 4.0–10.5)
nRBC: 0 % (ref 0.0–0.2)

## 2019-01-27 LAB — AEROBIC/ANAEROBIC CULTURE W GRAM STAIN (SURGICAL/DEEP WOUND)

## 2019-01-27 LAB — MAGNESIUM
Magnesium: 2 mg/dL (ref 1.7–2.4)
Magnesium: 1.1 mg/dL — ABNORMAL LOW (ref 1.7–2.4)

## 2019-01-27 LAB — CULTURE, BLOOD (ROUTINE X 2)
Culture: NO GROWTH
Culture: NO GROWTH
Special Requests: ADEQUATE
Special Requests: ADEQUATE

## 2019-01-27 LAB — PHOSPHORUS
Phosphorus: 4.1 mg/dL (ref 2.5–4.6)
Phosphorus: 2.1 mg/dL — ABNORMAL LOW (ref 2.5–4.6)

## 2019-01-27 LAB — D-DIMER, QUANTITATIVE: D-Dimer, Quant: 2.34 ug/mL-FEU — ABNORMAL HIGH (ref 0.00–0.50)

## 2019-01-27 LAB — C-REACTIVE PROTEIN: CRP: 27.1 mg/dL — ABNORMAL HIGH (ref <1.0)

## 2019-01-27 LAB — TRIGLYCERIDES: Triglycerides: 129 mg/dL (ref <150)

## 2019-01-27 LAB — FERRITIN: Ferritin: 293 ng/mL (ref 11–307)

## 2019-01-27 LAB — PROCALCITONIN: Procalcitonin: 0.84 ng/mL

## 2019-01-27 MED ORDER — POTASSIUM CHLORIDE 20 MEQ/15ML (10%) PO SOLN
40.0000 meq | Freq: Once | ORAL | Status: AC
  Administered 2019-01-27: 40 meq
  Filled 2019-01-27: qty 30

## 2019-01-27 MED ORDER — MAGNESIUM SULFATE 4 GM/100ML IV SOLN
4.0000 g | Freq: Once | INTRAVENOUS | Status: AC
  Administered 2019-01-27: 22:00:00 4 g via INTRAVENOUS
  Filled 2019-01-27: qty 100

## 2019-01-27 MED ORDER — POLYETHYLENE GLYCOL 3350 17 G PO PACK
17.0000 g | PACK | Freq: Every day | ORAL | Status: DC
  Administered 2019-01-27: 17 g
  Filled 2019-01-27 (×4): qty 1

## 2019-01-27 MED ORDER — SODIUM CHLORIDE 0.9 % IV SOLN
100.0000 mg | INTRAVENOUS | Status: DC
  Administered 2019-01-27 – 2019-01-30 (×4): 100 mg via INTRAVENOUS
  Filled 2019-01-27 (×5): qty 20

## 2019-01-27 MED ORDER — FUROSEMIDE 10 MG/ML IJ SOLN
60.0000 mg | Freq: Once | INTRAMUSCULAR | Status: AC
  Administered 2019-01-27: 60 mg via INTRAVENOUS
  Filled 2019-01-27: qty 6

## 2019-01-27 MED ORDER — INSULIN DETEMIR 100 UNIT/ML ~~LOC~~ SOLN
15.0000 [IU] | Freq: Every day | SUBCUTANEOUS | Status: DC
  Administered 2019-01-27: 15 [IU] via SUBCUTANEOUS
  Filled 2019-01-27 (×2): qty 0.15

## 2019-01-27 MED ORDER — INSULIN ASPART 100 UNIT/ML ~~LOC~~ SOLN
0.0000 [IU] | SUBCUTANEOUS | Status: DC
  Administered 2019-01-27: 4 [IU] via SUBCUTANEOUS
  Administered 2019-01-27: 16:00:00 11 [IU] via SUBCUTANEOUS
  Administered 2019-01-27 (×2): 7 [IU] via SUBCUTANEOUS
  Administered 2019-01-28: 17:00:00 3 [IU] via SUBCUTANEOUS
  Administered 2019-01-28: 04:00:00 7 [IU] via SUBCUTANEOUS
  Administered 2019-01-28: 09:00:00 4 [IU] via SUBCUTANEOUS
  Administered 2019-01-28: 01:00:00 11 [IU] via SUBCUTANEOUS
  Administered 2019-01-28: 21:00:00 4 [IU] via SUBCUTANEOUS
  Administered 2019-01-29 (×2): 7 [IU] via SUBCUTANEOUS
  Administered 2019-01-29 (×2): 4 [IU] via SUBCUTANEOUS
  Administered 2019-01-29: 7 [IU] via SUBCUTANEOUS
  Administered 2019-01-29 – 2019-01-30 (×3): 4 [IU] via SUBCUTANEOUS
  Administered 2019-01-30 (×2): 3 [IU] via SUBCUTANEOUS

## 2019-01-27 MED ORDER — ACETAMINOPHEN 160 MG/5ML PO SOLN
650.0000 mg | Freq: Four times a day (QID) | ORAL | Status: DC | PRN
  Administered 2019-01-27 – 2019-01-29 (×2): 650 mg
  Filled 2019-01-27 (×2): qty 20.3

## 2019-01-27 NOTE — Progress Notes (Signed)
Spoke with patients daughter and patients husband today, updated them both. Informed family about protocols regarding multiple phone calls. Husband was very understanding. Stated he would like to call around 1700 as he gets off work around 1600. Also stated he will discuss with patients daughter who will call. Family might try to alternate husband and daughter every other day.

## 2019-01-27 NOTE — Progress Notes (Signed)
Dr. paged because patient's mag level came back as 1.1. Verbal order given for Magnesium sulfate 4gm  IV x1 dose and magnesium level to be collected 1 hour after replacement has been infused and call with results to MD.

## 2019-01-27 NOTE — Progress Notes (Signed)
Inpatient Diabetes Program Recommendations  AACE/ADA: New Consensus Statement on Inpatient Glycemic Control (2015)  Target Ranges:  Prepandial:   less than 140 mg/dL      Peak postprandial:   less than 180 mg/dL (1-2 hours)      Critically ill patients:  140 - 180 mg/dL   Lab Results  Component Value Date   GLUCAP 254 (H) 01/27/2019   HGBA1C 11.3 (H) 01/23/2019    Results for Maureen Price, Maureen Price (MRN [id number]) as of 01/27/2019 19:02  Ref. Range 01/26/2019 23:11 01/27/2019 03:45 01/27/2019 07:59 01/27/2019 12:50 01/27/2019 15:38  Glucose-Capillary Latest Ref Range: 70 - 99 mg/dL 204 (H) 217 (H) 200 (H) 243 (H) 254 (H)      Review of Glycemic Control  Diabetes history: DM  Outpatient Diabetes medications: Tresiba 50 units daily + Bydureon q week + Farxiga 10 mg qd  Current orders for Inpatient glycemic control: Levemir 15 units daily                                                                           Novolog correction (0-20 units) Q4 hours   Solumedrol 40 mg Q8 hours.   TF@25ml /hour  CBG remain above goal.    MD please consider adding Novolog 4 units Q4 hour TF coverage. (Hold if TF held or discontinued).   -- Will follow during hospitalization.--  Jonna Clark RN, MSN Diabetes Coordinator Inpatient Glycemic Control Team Team Pager: [phone] (8am-5pm)

## 2019-01-27 NOTE — Progress Notes (Signed)
Facetimed with husband and son around 69. Family updated again. Husbands name is Arnold Long and his number is [phone]. He requested to call/facetime around 1700 going forward as he is off work at 1600.

## 2019-01-27 NOTE — Progress Notes (Signed)
      INFECTIOUS DISEASE ATTENDING ADDENDUM:   Date: 01/27/2019  Patient name: Maureen Price  Medical record number: [id number]  Date of birth: 02/09/1976   Patient with Fourniers with Strep anginosis and MSSA on culture  Would stop the vancomycin and continue merrem given pts alleged severe PCN allergy    Alcide Evener 01/27/2019, 3:44 PM

## 2019-01-27 NOTE — Plan of Care (Signed)
  Problem: Education: Goal: Knowledge of General Education information will improve Description Including pain rating scale, medication(s)/side effects and non-pharmacologic comfort measures Outcome: Progressing   Problem: Health Behavior/Discharge Planning: Goal: Ability to manage health-related needs will improve Outcome: Progressing   Problem: Clinical Measurements: Goal: Ability to maintain clinical measurements within normal limits will improve Outcome: Progressing Goal: Will remain free from infection Outcome: Progressing Goal: Diagnostic test results will improve Outcome: Progressing Goal: Respiratory complications will improve Outcome: Progressing Goal: Cardiovascular complication will be avoided Outcome: Progressing   Problem: Activity: Goal: Risk for activity intolerance will decrease Outcome: Progressing   Problem: Nutrition: Goal: Adequate nutrition will be maintained Outcome: Progressing   Problem: Elimination: Goal: Will not experience complications related to bowel motility Outcome: Progressing Goal: Will not experience complications related to urinary retention Outcome: Progressing   Problem: Pain Managment: Goal: General experience of comfort will improve Outcome: Progressing   Problem: Safety: Goal: Ability to remain free from injury will improve Outcome: Progressing   Problem: Skin Integrity: Goal: Risk for impaired skin integrity will decrease Outcome: Progressing   Problem: Activity: Goal: Ability to tolerate increased activity will improve Outcome: Progressing   Problem: Respiratory: Goal: Ability to maintain a clear airway and adequate ventilation will improve Outcome: Progressing   Problem: Role Relationship: Goal: Method of communication will improve Outcome: Progressing

## 2019-01-27 NOTE — Progress Notes (Signed)
NAME:  CHASIDY JANAK, MRN:  [id number], DOB:  1976/05/29, LOS: 4 ADMISSION DATE:  01/22/2019, CONSULTATION DATE: May 19 REFERRING MD: Dr. Posey Pronto, CHIEF COMPLAINT: Shortness of breath  Brief History   43 year old female with poorly controlled diabetes initially admitted on May 17 for Fournier's gangrene requiring surgical debridement, found to be COVID positive.  Developed ARDS on May 20 requiring intubation, transferred to [facility] the same day.   Past Medical History  Diabetes mellitus type 2 Asthma Gastroesophageal reflux disease Depression Coronary artery disease Obesity Tobacco abuse  Significant Hospital Events   May 17 admitted for Fournier's gangrene May 18 surgical debridement May 20 worsening hypoxemia, ARDS, intubated, transferred to [facility]:  General surgery PCCM  Procedures:  May 18 surgical debridement of gangrenous soft tissue  Significant Diagnostic Tests:  May 17 CT abdomen pelvis subcutaneous emphysema of soft tissue around the right aspect of anus, perineum, posterior right vulva, there is no discrete fluid collection appreciated however there was concern for gas-forming infection  Micro Data:  SARS COV 2 5/18: Positive 5/17: Blood cultures x2>>> negative 5/18: Fungal stain from abscess>>> 5/18: Fungal culture>>> 5/18: Abscess culture from deep wound >>  few Streptococcus anginosis and rare staph aureus  Antimicrobials:  Aztreonam May 17 > 5/21 Clindamycin May 17 > 5/21 Meropenem 5/21 >>  Vancomycin May 17 >  Remdesivir May 19 >   Interim history/subjective:  Clindamycin completed Urine output 325 cc for 5/21, 1 L this morning Sedated  Objective   Blood pressure (!) 152/88, pulse (!) 140, temperature 98.8 F (37.1 C), resp. rate (!) 31, height 5' 5.5" (1.664 m), weight 117.4 kg, last menstrual period 12/29/2018, SpO2 (!) 88 %.    Vent Mode: PRVC FiO2 (%):  [40 %-60 %] 40 % Set Rate:  [28 bmp] 28 bmp  Vt Set:  [440 mL] 440 mL PEEP:  [5 OMV67-20 cmH20] 5 cmH20 Plateau Pressure:  [19 cmH20-34 cmH20] 19 cmH20   Intake/Output Summary (Last 24 hours) at 01/27/2019 1050 Last data filed at 01/27/2019 0700 Gross per 24 hour  Intake 5453.93 ml  Output 1000 ml  Net 4453.93 ml   Filed Weights   01/23/19 0430 01/24/19 0600 01/27/19 0351  Weight: 95.2 kg 103.4 kg 117.4 kg    Examination:  General: Obese woman, ventilated, fairly comfortable HENT: ET tube, OG tube in place, no oral lesions PULM: Coarse ventilator sounds, no wheezing CV: Regular, borderline tachycardic, no murmur GI: Obese, soft, nondistended, hypoactive bowel sounds GU: Perineal wound examined, packing removed.  No overt ischemic areas noted.  There is some white/tan secretions, some granulation tissue forming.  No odor MSK: No deformities Neuro: Sedated but does wake easily to voice, nodded to questions, follows commands    Resolved Hospital Problem list     Assessment & Plan:  ARDS due to COVID-19: Oxygenation improved this morning Continue PRVC, 6 cc/kg as per ARDS protocol.  May be able to push for spontaneous breathing trial soon depending on gas exchange, how much pain control she requires Has not required proning thus far Continue current sedation to ensure ventilator synchrony as well as pain control given her perineal wound VAP prevention order set Consider diuresis when renal function will allow, defer on 5/22 Corticosteroids as ordered.  Actemra deferred given active infection Remdesivir day 4  Necrotizing fasciitis: Continue wet-to-dry dressing changes and packing.  Appreciate surgery assistance.  Will discuss wound care with Dr. Donne Hazel if an increase in purulence noted Continue  meropenem, vancomycin.  Consider DC vancomycin soon based on culture data  Acute renal failure, nonoliguric.  Due to severe sepsis, recent active diuresis: Hold diuresis for now, reinitiate when stable to do so Ensure  adequate renal perfusion Avoid nephrotoxins  Nutrition: TF     Best practice:  Diet: TF Pain/Anxiety/Delirium protocol (if indicated): yes, RASS goal -2 via PAD protocol VAP protocol (if indicated): yes DVT prophylaxis: lovenox GI prophylaxis: pantoprazole Glucose control: glucose control Mobility: bed rest Code Status: full Family Communication: per Dr [place] Pink today Disposition: remain in ICU  Labs   CBC: Recent Labs  Lab 01/22/19 2037 01/23/19 0513 01/25/19 0545  01/26/19 0341 01/26/19 1122 01/26/19 1600 01/27/19 0512 01/27/19 0535  WBC 23.4* 25.8* 23.7*  --  18.9*  --   --   --  20.7*  NEUTROABS 19.6*  --  19.4*  --   --   --   --   --   --   HGB 13.3 12.1  12.3 10.9*   < > 8.6* 7.8* 8.3* 8.5* 8.3*  HCT 38.7 34.1* 31.4*   < > 25.3* 23.0* 23.8* 25.0* 24.3*  MCV 80.1 78.4* 79.7*  --  82.4  --   --   --  82.9  PLT 251 254 257  --  241  --   --   --  271   < > = values in this interval not displayed.    Basic Metabolic Panel: Recent Labs  Lab 01/23/19 0513 01/23/19 1436 01/25/19 0545  01/25/19 2203 01/26/19 0341 01/26/19 1122 01/26/19 1600 01/27/19 0512 01/27/19 0535  NA 136 136 137   < > 141 141 141  --  142 142  K 3.0* 3.4* 3.2*   < > 3.1* 3.2* 3.8  --  3.5 3.2*  CL 103 104 103  --   --  110  --   --   --  112*  CO2 20* 18* 24  --   --  24  --   --   --  19*  GLUCOSE 269* 259* 219*  --   --  77  --   --   --  230*  BUN 6 6 <5*  --   --  11  --   --   --  34*  CREATININE 0.54 0.59 0.49  --   --  1.12*  --   --   --  1.52*  CALCIUM 8.4* 8.3* 8.0*  --   --  7.4*  --   --   --  6.8*  MG  --   --  1.4*  --   --  1.5*  --  1.3*  --  2.0  PHOS  --  3.4 3.3  --   --   --   --  3.3  --  4.1   < > = values in this interval not displayed.   GFR: Estimated Creatinine Clearance: 62.3 mL/min (A) (by C-G formula based on SCr of 1.52 mg/dL (H)). Recent Labs  Lab 01/22/19 2037 01/22/19 2222 01/23/19 0019 01/23/19 0513 01/25/19 0545 01/26/19 0341 01/27/19  0535  PROCALCITON  --   --  0.24  --   --   --  0.84  WBC 23.4*  --   --  25.8* 23.7* 18.9* 20.7*  LATICACIDVEN 1.9 1.2  --   --   --   --   --     Liver Function Tests: Recent Labs  Lab 01/22/19 2037 01/23/19  1436 01/25/19 0545 01/26/19 0341 01/27/19 0535  AST 18  --   --  36 24  ALT 23  --   --  35 35  ALKPHOS 134*  --   --  105 110  BILITOT 0.8  --   --  0.8 0.2*  PROT 6.9  --   --  5.7* 5.5*  ALBUMIN 2.7* 2.1* 1.8* 2.0* 1.7*   No results for input(s): LIPASE, AMYLASE in the last 168 hours. No results for input(s): AMMONIA in the last 168 hours.  ABG    Component Value Date/Time   PHART 7.341 (L) 01/27/2019 0512   PCO2ART 39.1 01/27/2019 0512   PO2ART 109.0 (H) 01/27/2019 0512   HCO3 21.2 01/27/2019 0512   TCO2 22 01/27/2019 0512   ACIDBASEDEF 4.0 (H) 01/27/2019 0512   O2SAT 98.0 01/27/2019 0512     Coagulation Profile: Recent Labs  Lab 01/23/19 0019  INR 1.3*    Cardiac Enzymes: Recent Labs  Lab 01/23/19 0513  TROPONINI 0.03*    HbA1C: Hemoglobin A1C  Date/Time Value Ref Range Status  02/28/2018 08:45 AM 10.5 (A) 4.0 - 5.6 % Final  06/23/2017 10:16 AM 11.7  Final   Hgb A1c MFr Bld  Date/Time Value Ref Range Status  01/23/2019 02:36 PM 11.3 (H) 4.8 - 5.6 % Final    Comment:    (NOTE) Pre diabetes:          5.7%-6.4% Diabetes:              >6.4% Glycemic control for   <7.0% adults with diabetes   09/19/2016 03:46 AM 13.1 (H) 4.8 - 5.6 % Final    Comment:    (NOTE)         Pre-diabetes: 5.7 - 6.4         Diabetes: >6.4         Glycemic control for adults with diabetes: <7.0     CBG: Recent Labs  Lab 01/26/19 1652 01/26/19 2004 01/26/19 2311 01/27/19 0345 01/27/19 0759  GLUCAP 140* 179* 204* 217* 200*     Critical care time: 34 minutes     Baltazar Apo, MD, PhD 01/27/2019, 11:02 AM [facility] [phone] or if no answer [phone]

## 2019-01-27 NOTE — Progress Notes (Addendum)
PROGRESS NOTE  Maureen Price QQV:[id number] DOB: Mar 24, 1976 DOA: 01/22/2019  PCP: [name], MD  Brief History/Interval Summary: Pt. with PMH of Asthma, poorly controlled type II DM, GERD, depression, CAD S/P PCI, active smoker, obesity; admitted on 01/22/2019, presented with complaint of perineal pain, was found to have Fournier's gangrene.  S/P incision and debridement 01/23/2019.  Her SARS-CoV-2 was positive.  01/25/2019 patient comes more hypoxic unable to maintain adequate saturation on nonrebreather.    She had to be transferred to the ICU.  She was intubated and then subsequently transferred to the [facility].    Reason for Visit: Acute respiratory failure with hypoxia secondary to COVID-19  Consultants: Pulmonology.  General surgery has been following  Procedures: Incision and debridement of necrotizing soft tissue in the perineum and buttock area on 5/18  Antibiotics: Anti-infectives (From admission, onward)   Start     Dose/Rate Route Frequency Ordered Stop   01/26/19 2200  meropenem (MERREM) 1 g in sodium chloride 0.9 % 100 mL IVPB     1 g 200 mL/hr over 30 Minutes Intravenous Every 8 hours 01/26/19 1453     01/26/19 1415  remdesivir 100 mg in sodium chloride 0.9 % 250 mL IVPB     100 mg over 30 Minutes Intravenous Every 24 hours 01/25/19 1421 02/04/19 1429   01/26/19 1400  vancomycin (VANCOCIN) IVPB 750 mg/150 ml premix     750 mg 150 mL/hr over 60 Minutes Intravenous Every 12 hours 01/26/19 1326     01/25/19 1430  remdesivir 200 mg in sodium chloride 0.9 % 250 mL IVPB     200 mg over 30 Minutes Intravenous Once 01/25/19 1421 01/25/19 1949   01/24/19 2200  vancomycin (VANCOCIN) 1,250 mg in sodium chloride 0.9 % 250 mL IVPB  Status:  Discontinued     1,250 mg 166.7 mL/hr over 90 Minutes Intravenous Every 12 hours 01/24/19 1508 01/26/19 1046   01/23/19 1215  aztreonam (AZACTAM) 2 g in sodium chloride 0.9 % 100 mL IVPB  Status:  Discontinued     2 g 200 mL/hr  over 30 Minutes Intravenous To Surgery 01/23/19 1207 01/23/19 1350   01/23/19 1200  vancomycin (VANCOCIN) IVPB 1000 mg/200 mL premix  Status:  Discontinued     1,000 mg 200 mL/hr over 60 Minutes Intravenous Every 12 hours 01/22/19 2359 01/24/19 1508   01/23/19 0800  clindamycin (CLEOCIN) IVPB 900 mg  Status:  Discontinued     900 mg 100 mL/hr over 30 Minutes Intravenous Every 8 hours 01/23/19 0035 01/26/19 1451   01/23/19 0000  clindamycin (CLEOCIN) IVPB 900 mg     900 mg 100 mL/hr over 30 Minutes Intravenous  Once 01/22/19 2350 01/23/19 0101   01/23/19 0000  aztreonam (AZACTAM) 2 g in sodium chloride 0.9 % 100 mL IVPB  Status:  Discontinued     2 g 200 mL/hr over 30 Minutes Intravenous Every 8 hours 01/22/19 2353 01/26/19 1453   01/23/19 0000  vancomycin (VANCOCIN) 1,500 mg in sodium chloride 0.9 % 500 mL IVPB     1,500 mg 250 mL/hr over 120 Minutes Intravenous  Once 01/22/19 2353 01/23/19 1930       Subjective/Interval History: Patient remains intubated and sedated.  Noted to follow commands this morning.     Assessment/Plan:  Acute Hypoxic Resp. Failure due to Acute Covid 19  Vent Mode: PRVC FiO2 (%):  [40 %-60 %] 40 % Set Rate:  [28 bmp] 28 bmp Vt Set:  [440  mL] 440 mL PEEP:  [5 cmH20-12 cmH20] 5 cmH20 Plateau Pressure:  [19 cmH20-34 cmH20] 19 cmH20     Component Value Date/Time   PHART 7.341 (L) 01/27/2019 0512   PCO2ART 39.1 01/27/2019 0512   PO2ART 109.0 (H) 01/27/2019 0512   HCO3 21.2 01/27/2019 0512   TCO2 22 01/27/2019 0512   ACIDBASEDEF 4.0 (H) 01/27/2019 0512   O2SAT 98.0 01/27/2019 0512    COVID-19 Labs  Recent Labs    01/25/19 0545 01/26/19 0341 01/27/19 0535 01/27/19 0545  DDIMER 2.55* 2.57* 2.34*  --   FERRITIN 254  --   --  293  CRP 24.0* 34.7*  --  27.1*    Lab Results  Component Value Date   SARSCOV2NAA POSITIVE (A) 01/23/2019     Fever: Afebrile the last 24 hours.  Last fever was 103.3 F on 5/20. Oxygen requirements: Remains on  mechanical ventilation.  50% FiO2.  Saturating in the 90s.   Antibiotics: On vancomycin aztreonam.  Clindamycin was discontinued yesterday.  Aztreonam was changed to meropenem. Steroids: Initiated on Solu-Medrol on 5/21  Diuretics: Lasix 60 mg IV x1 today Remdesivir: Initiated on 5/20 Actemra: Not given due to recent severe infection (Fournier's) Convalescent Plasma: Not yet Vitamin C and Zinc: On vitamin C   Patient remains on mechanical ventilation.  Noted to be on pressure support this morning.  Pulmonology is following and managing ventilator.  Good oxygenation. Patient not given Actemra due to recent severe infection in the form of Fournier's gangrene.  She was started on Solu-Medrol.  Patient remains on Remdesivir as well.  Inflammatory markers show improvement today.  CRP is down to 27.1.  Ferritin is 293. Patient is on higher dose of Lovenox per COVID ICU protocol.  Fournier's gangrene/sepsis present on admission She underwent surgical debridement on 5/18.  Undergoing dressing changes.  Wound was examined today.  She does have some purulent exudate in the wound.  Some granulation tissue is also noted. Patient was initially on broad-spectrum coverage with vancomycin and aztreonam and clindamycin.  Clindamycin was discontinued by CCM yesterday.  Aztreonam was changed to meropenem.  Cultures from the wound grew strep anginosis as well as rare staph aureus.  WBC noted to be 20.7 today.  Pro calcitonin was 0.24 on 5/18.  Noted to be 0.84 today.  Discussed with Dr. Tommy Medal with infectious disease.  Still waiting on sensitivities of the staph aureus.  He is advises to keep her on current antibiotics for now.  Addendum: Final Sensitivity Report Available Now.  Okay to Stop Vancomycin.  Continue Just Meropenem.  Diabetes mellitus type 2 uncontrolled with peripheral vascular disease Apparently patient is noncompliant with insulin.  Has had a few episodes of hypoglycemia during this hospitalization.   Her long-acting insulin was discontinued yesterday.  Today patient is noted to be hyperglycemic most likely due to steroids.  We will initiate her back on long-acting insulin.  HbA1c 11.3.    Normocytic anemia Drop in hemoglobin is noted.  No evidence of overt bleeding.  Could be surgical loss versus acute illness versus hemodilution.  Anemia panel is pending.  Patient with known history of B12 deficiency.  Acute renal failure with hypokalemia and hypomagnesemia Increase in creatinine noted.  Creatinine up to 1.52 today with a BUN of 34.  Potassium noted to be low at 3.2.  She did make about 1020m of urine over the last 24 hours.  She is on IV fluids.  She is noted to have had an increase  in weight over the last 5 days.  Patient could be volume overloaded. Continue with IV fluids.  Monitor urine output.  Replace potassium.  Might give her a trial of diuretics.  CVP measurement might help.  Blood pressure noted to be normotensive.  Echocardiogram done in 2018 showed normal systolic function.  Coronary artery disease Stable.  She also has a history of peripheral vascular disease.  Apparently history of PCI as well.  Had MI in 2015.  Previously on aspirin and Plavix but the last few notes from PCP did not mention Plavix.  Continue only aspirin for now.     History of asthma Currently stable.  Morbid obesity BMI 37.36.  History of active smoking Will need counseling eventually  FEN Remains on IV fluids.  Replace potassium.  On tube feedings.   DVT Prophylaxis: On higher dose of Lovenox per COVID ICU protocol  Lab Results  Component Value Date   PLT 271 01/27/2019     PUD Prophylaxis: Protonix Code Status: Full code Family Communication: Discussed with her husband yesterday Disposition Plan: Will remain in ICU    Medications:  Scheduled: . aspirin  81 mg Per Tube Daily  . chlorhexidine gluconate (MEDLINE KIT)  15 mL Mouth Rinse BID  . Chlorhexidine Gluconate Cloth  6 each  Topical Daily  . enoxaparin (LOVENOX) injection  0.5 mg/kg Subcutaneous Q12H  . feeding supplement (PRO-STAT SUGAR FREE 64)  60 mL Per Tube TID  . feeding supplement (VITAL HIGH PROTEIN)  1,000 mL Per Tube Q24H  . fentaNYL (SUBLIMAZE) injection  100 mcg Intravenous Once  . insulin aspart  0-20 Units Subcutaneous Q4H  . insulin detemir  15 Units Subcutaneous Daily  . mouth rinse  15 mL Mouth Rinse 10 times per day  . methylPREDNISolone (SOLU-MEDROL) injection  40 mg Intravenous Q8H  . multivitamin with minerals  1 tablet Per Tube Daily  . pantoprazole sodium  40 mg Per Tube Daily  . rocuronium  80 mg Intravenous Once  . sodium chloride flush  10-40 mL Intracatheter Q12H  . vitamin B-12  2,000 mcg Oral Daily  . vitamin C  1,000 mg Per Tube Daily   Continuous: . sodium chloride 100 mL/hr at 01/27/19 0700  . fentaNYL infusion INTRAVENOUS 100 mcg/hr (01/27/19 0700)  . meropenem (MERREM) IV Stopped (01/27/19 0541)  . propofol (DIPRIVAN) infusion 23 mcg/kg/min (01/27/19 0700)  . remdesivir 100 mg in NS 250 mL    . vancomycin Stopped (01/27/19 0213)   VQM:[id number], ALPRAZolam, fentaNYL, midazolam, sodium chloride flush   Objective:  Vital Signs  Vitals:   01/27/19 0815 01/27/19 0833 01/27/19 0900 01/27/19 1000  BP:   96/77 (!) 152/88  Pulse:   (!) 107 (!) 140  Resp:   (!) 33 (!) 31  Temp:      TempSrc:      SpO2: 94% 93% 92% (!) 74%  Weight:      Height:        Intake/Output Summary (Last 24 hours) at 01/27/2019 1047 Last data filed at 01/27/2019 0700 Gross per 24 hour  Intake 5453.93 ml  Output 1000 ml  Net 4453.93 ml   Filed Weights   01/23/19 0430 01/24/19 0600 01/27/19 0351  Weight: 95.2 kg 103.4 kg 117.4 kg   General appearance: Intubated sedated but partially responsive today.  Following commands. Resp: Coarse breath sounds bilaterally.  Crackles at the bases.  No wheezing or rhonchi. Cardio: S1-S2 is normal regular.  No S3-S4.  No rubs murmurs or  bruit GI:  Abdomen is soft.  Nontender nondistended.  Bowel sounds are present normal.  No masses organomegaly Perineum: Wound was examined today.  Yellowish exudate noted.  No active drainage.  No bleeding.  Some granulation tissue is also noted. Extremities: Minimal edema bilateral lower extremities Neurologic: Following commands off of sedation.  Moving all her extremities.     Lab Results:  Data Reviewed: I have personally reviewed following labs and imaging studies  CBC: Recent Labs  Lab 01/22/19 2037 01/23/19 0513 01/25/19 0545  01/26/19 0341 01/26/19 1122 01/26/19 1600 01/27/19 0512 01/27/19 0535  WBC 23.4* 25.8* 23.7*  --  18.9*  --   --   --  20.7*  NEUTROABS 19.6*  --  19.4*  --   --   --   --   --   --   HGB 13.3 12.1  12.3 10.9*   < > 8.6* 7.8* 8.3* 8.5* 8.3*  HCT 38.7 34.1* 31.4*   < > 25.3* 23.0* 23.8* 25.0* 24.3*  MCV 80.1 78.4* 79.7*  --  82.4  --   --   --  82.9  PLT 251 254 257  --  241  --   --   --  271   < > = values in this interval not displayed.    Basic Metabolic Panel: Recent Labs  Lab 01/23/19 0513 01/23/19 1436 01/25/19 0545  01/25/19 2203 01/26/19 0341 01/26/19 1122 01/26/19 1600 01/27/19 0512 01/27/19 0535  NA 136 136 137   < > 141 141 141  --  142 142  K 3.0* 3.4* 3.2*   < > 3.1* 3.2* 3.8  --  3.5 3.2*  CL 103 104 103  --   --  110  --   --   --  112*  CO2 20* 18* 24  --   --  24  --   --   --  19*  GLUCOSE 269* 259* 219*  --   --  77  --   --   --  230*  BUN 6 6 <5*  --   --  11  --   --   --  34*  CREATININE 0.54 0.59 0.49  --   --  1.12*  --   --   --  1.52*  CALCIUM 8.4* 8.3* 8.0*  --   --  7.4*  --   --   --  6.8*  MG  --   --  1.4*  --   --  1.5*  --  1.3*  --  2.0  PHOS  --  3.4 3.3  --   --   --   --  3.3  --  4.1   < > = values in this interval not displayed.    GFR: Estimated Creatinine Clearance: 62.3 mL/min (A) (by C-G formula based on SCr of 1.52 mg/dL (H)).  Liver Function Tests: Recent Labs  Lab 01/22/19 2037  01/23/19 1436 01/25/19 0545 01/26/19 0341 01/27/19 0535  AST 18  --   --  36 24  ALT 23  --   --  35 35  ALKPHOS 134*  --   --  105 110  BILITOT 0.8  --   --  0.8 0.2*  PROT 6.9  --   --  5.7* 5.5*  ALBUMIN 2.7* 2.1* 1.8* 2.0* 1.7*     Coagulation Profile: Recent Labs  Lab 01/23/19 0019  INR 1.3*    Cardiac Enzymes: Recent Labs  Lab 01/23/19 0513  TROPONINI 0.03*    HbA1C: No results for input(s): HGBA1C in the last 72 hours.  CBG: Recent Labs  Lab 01/26/19 1652 01/26/19 2004 01/26/19 2311 01/27/19 0345 01/27/19 0759  GLUCAP 140* 179* 204* 217* 200*    Lipid Profile: Recent Labs    01/25/19 1445 01/27/19 0535  TRIG 116 129    Anemia Panel: Recent Labs    01/25/19 0545 01/27/19 0545  FERRITIN 254 293    Recent Results (from the past 240 hour(s))  Culture, blood (routine x 2)     Status: None   Collection Time: 01/22/19  8:40 PM  Result Value Ref Range Status   Specimen Description BLOOD LEFT HAND  Final   Special Requests   Final    BOTTLES DRAWN AEROBIC AND ANAEROBIC Blood Culture adequate volume   Culture   Final    NO GROWTH 5 DAYS Performed at [facility], [place] [address], [place], [place] [address]    Report Status 01/27/2019 FINAL  Final  Culture, blood (routine x 2)     Status: None   Collection Time: 01/22/19  8:58 PM  Result Value Ref Range Status   Specimen Description BLOOD RIGHT ANTECUBITAL  Final   Special Requests   Final    BOTTLES DRAWN AEROBIC ONLY Blood Culture adequate volume   Culture   Final    NO GROWTH 5 DAYS Performed at [facility], [place] [address] [place], [place] [address]    Report Status 01/27/2019 FINAL  Final  SARS Coronavirus 2 (CEPHEID - Performed in [place] hospital lab), Hosp Order     Status: Abnormal   Collection Time: 01/23/19  1:19 AM  Result Value Ref Range Status   SARS Coronavirus 2 POSITIVE (A) NEGATIVE Final    Comment: RESULT CALLED TO, READ BACK BY AND VERIFIED WITH: R  FITZGERAKD ANSTI _0  01/23/19 BY S GEZAHEGN (NOTE) If result is NEGATIVE SARS-CoV-2 target nucleic acids are NOT DETECTED. The SARS-CoV-2 RNA is generally detectable in upper and lower  respiratory specimens during the acute phase of infection. The lowest  concentration of SARS-CoV-2 viral copies this assay can detect is 250  copies / mL. A negative result does not preclude SARS-CoV-2 infection  and should not be used as the sole basis for treatment or other  patient management decisions.  A negative result may occur with  improper specimen collection / handling, submission of specimen other  than nasopharyngeal swab, presence of viral mutation(s) within the  areas targeted by this assay, and inadequate number of viral copies  (<250 copies / mL). A negative result must be combined with clinical  observations, patient history, and epidemiological information. If result is POSITIVE SARS-CoV-2 target nucleic acids are D ETECTED. The SARS-CoV-2 RNA is generally detectable in upper and lower  respiratory specimens during the acute phase of infection.  Positive  results are indicative of active infection with SARS-CoV-2.  Clinical  correlation with patient history and other diagnostic information is  necessary to determine patient infection status.  Positive results do  not rule out bacterial infection or co-infection with other viruses. If result is PRESUMPTIVE POSTIVE SARS-CoV-2 nucleic acids MAY BE PRESENT.   A presumptive positive result was obtained on the submitted specimen  and confirmed on repeat testing.  While 2019 novel coronavirus  (SARS-CoV-2) nucleic acids may be present in the submitted sample  additional confirmatory testing may be necessary for epidemiological  and / or clinical management  purposes  to differentiate between  SARS-CoV-2 and other Sarbecovirus currently known to infect humans.  If clinically indicated additional testing with an alternate test  methodology  [id number]) is advised. The SARS-CoV-2 RNA is generally  detectable in upper and lower respiratory specimens during the acute  phase of infection. The expected result is Negative. Fact Sheet for Patients:  StrictlyIdeas.no Fact Sheet for Healthcare Providers: BankingDealers.co.za This test is not yet approved or cleared by the [place] FDA and has been authorized for detection and/or diagnosis of SARS-CoV-2 by FDA under an Emergency Use Authorization (EUA).  This EUA will remain in effect (meaning this test can be used) for the duration of the COVID-19 declaration under Section 564(b)(1) of the Act, 21 U.S.C. section 360bbb-3(b)(1), unless the authorization is terminated or revoked sooner. Performed at [facility], [place] [address], [place], [place] [address]   Aerobic/Anaerobic Culture (surgical/deep wound)     Status: None   Collection Time: 01/23/19 12:16 PM  Result Value Ref Range Status   Specimen Description ABSCESS PERINEUM  Final   Special Requests PATIENT ON FOLLOWING CLEOCIN AND AZACTAM  Final   Gram Stain   Final    ABUNDANT WBC PRESENT,BOTH PMN AND MONONUCLEAR ABUNDANT GRAM NEGATIVE RODS MODERATE GRAM VARIABLE ROD FEW GRAM POSITIVE COCCI Performed at [facility], [place] [address], [place], [place] [address]    Culture   Final    FEW STREPTOCOCCUS ANGINOSIS MIXED ANAEROBIC FLORA PRESENT.  CALL LAB IF FURTHER IID REQUIRED.    Report Status 01/26/2019 FINAL  Final   Organism ID, Bacteria STREPTOCOCCUS ANGINOSIS  Final      Susceptibility   Streptococcus anginosis - MIC*    PENICILLIN 0.12 SENSITIVE Sensitive     CEFTRIAXONE 0.5 SENSITIVE Sensitive     ERYTHROMYCIN >=8 RESISTANT Resistant     LEVOFLOXACIN 1 SENSITIVE Sensitive     VANCOMYCIN 0.5 SENSITIVE Sensitive     * FEW STREPTOCOCCUS ANGINOSIS  Fungus Culture With Stain     Status: None (Preliminary result)   Collection Time: 01/23/19 12:41 PM   Result Value Ref Range Status   Fungus Stain Final report  Final    Comment: (NOTE) Performed At: [facility] [place], [place] [id number] Rush Farmer MD GY:[id number]    Fungus (Mycology) Culture PENDING  Incomplete   Fungal Source TISSUE  Final    Comment: PERINEUM Performed at [facility], [place] [address], [place], [place] [address]   Aerobic/Anaerobic Culture (surgical/deep wound)     Status: None (Preliminary result)   Collection Time: 01/23/19 12:41 PM  Result Value Ref Range Status   Specimen Description TISSUE PERINEUM  Final   Special Requests PATIENT ON FOLLOWING CLEOCIN AND AZACTAM  Final   Gram Stain   Final    MODERATE WBC PRESENT,BOTH PMN AND MONONUCLEAR ABUNDANT GRAM NEGATIVE RODS FEW GRAM VARIABLE ROD FEW GRAM POSITIVE COCCI    Culture   Final    RARE STAPHYLOCOCCUS AUREUS RARE STREPTOCOCCUS ANGINOSIS HOLDING FOR POSSIBLE ANAEROBE Performed at [facility], [place] [address], [place], [place] [address]    Report Status PENDING  Incomplete   Organism ID, Bacteria STREPTOCOCCUS ANGINOSIS  Final      Susceptibility   Streptococcus anginosis - MIC*    PENICILLIN <=0.06 SENSITIVE Sensitive     CEFTRIAXONE 0.5 SENSITIVE Sensitive     ERYTHROMYCIN >=8 RESISTANT Resistant     LEVOFLOXACIN 0.5 SENSITIVE Sensitive     VANCOMYCIN  0.5 SENSITIVE Sensitive     * RARE STREPTOCOCCUS ANGINOSIS  Fungus Culture Result     Status: None   Collection Time: 01/23/19 12:41 PM  Result Value Ref Range Status   Result 1 Comment  Final    Comment: (NOTE) KOH/Calcofluor preparation:  no fungus observed. Performed At: [facility] [place], [place] [id number] Rush Farmer MD IW:[id number]       Radiology Studies: Dg Chest Port 1 View  Result Date: 01/26/2019 CLINICAL DATA:  Acute respiratory failure with hypoxia EXAM: PORTABLE CHEST 1 VIEW COMPARISON:  01/25/2019 FINDINGS: Cardiac shadow is within normal limits.  Endotracheal tube is noted just above the carina. Gastric catheter and PICC line on the right are seen and stable. Patchy infiltrates are again identified and stable. No new focal abnormality is seen. IMPRESSION: Stable patchy infiltrates bilaterally. Tubes and lines as described. Electronically Signed   By: Inez Catalina M.D.   On: 01/26/2019 07:39   Dg Chest Port 1 View  Result Date: 01/25/2019 CLINICAL DATA:  Endotracheal tube positioning. EXAM: PORTABLE CHEST 1 VIEW COMPARISON:  01/23/2019 FINDINGS: The endotracheal tube terminates in the right mainstem bronchus. The right-sided PICC line is well position. Enteric tube terminates below the left hemidiaphragm. The lung volumes are low. There are diffuse hazy bilateral airspace opacities which have worsened since the prior study. There is no pneumothorax. The cardiac silhouette is stable. There is worsening retrocardiac consolidation. IMPRESSION: 1. Endotracheal tube terminates in the right mainstem bronchus. Repositioning is recommended. 2. The right-sided PICC line is well positioned. The enteric tube terminates below the left hemidiaphragm. 3. Diffuse hazy bilateral airspace opacities, significantly worsened since prior study. There is new retrocardiac consolidation. Findings can be seen in patients with pulmonary edema or an atypical infectious process. Developing ARDS can have a similar appearance. These results will be called to the ordering clinician or representative by the Radiologist Assistant, and communication documented in the PACS or zVision Dashboard. Electronically Signed   By: Constance Holster M.D.   On: 01/25/2019 17:51   [place] Ekg Site Rite  Result Date: 01/25/2019 If Site Rite image not attached, placement could not be confirmed due to current cardiac rhythm.      LOS: 4 days   Chevelle Coulson [employer] on [email or web address]  01/27/2019, 10:47 AM

## 2019-01-28 ENCOUNTER — Inpatient Hospital Stay (HOSPITAL_COMMUNITY): Payer: BC Managed Care – PPO

## 2019-01-28 LAB — COMPREHENSIVE METABOLIC PANEL
ALT: 30 U/L (ref 0–44)
AST: 16 U/L (ref 15–41)
Albumin: 1.7 g/dL — ABNORMAL LOW (ref 3.5–5.0)
Alkaline Phosphatase: 100 U/L (ref 38–126)
Anion gap: 8 (ref 5–15)
BUN: 51 mg/dL — ABNORMAL HIGH (ref 6–20)
CO2: 22 mmol/L (ref 22–32)
Calcium: 7.8 mg/dL — ABNORMAL LOW (ref 8.9–10.3)
Chloride: 114 mmol/L — ABNORMAL HIGH (ref 98–111)
Creatinine, Ser: 1.6 mg/dL — ABNORMAL HIGH (ref 0.44–1.00)
GFR calc Af Amer: 46 mL/min — ABNORMAL LOW (ref >60)
GFR calc non Af Amer: 39 mL/min — ABNORMAL LOW (ref >60)
Glucose, Bld: 267 mg/dL — ABNORMAL HIGH (ref 70–99)
Potassium: 3.9 mmol/L (ref 3.5–5.1)
Sodium: 144 mmol/L (ref 135–145)
Total Bilirubin: 0.3 mg/dL (ref 0.3–1.2)
Total Protein: 5.9 g/dL — ABNORMAL LOW (ref 6.5–8.1)

## 2019-01-28 LAB — FERRITIN: Ferritin: 237 ng/mL (ref 11–307)

## 2019-01-28 LAB — GLUCOSE, CAPILLARY
Glucose-Capillary: 115 mg/dL — ABNORMAL HIGH (ref 70–99)
Glucose-Capillary: 133 mg/dL — ABNORMAL HIGH (ref 70–99)
Glucose-Capillary: 150 mg/dL — ABNORMAL HIGH (ref 70–99)
Glucose-Capillary: 157 mg/dL — ABNORMAL HIGH (ref 70–99)
Glucose-Capillary: 230 mg/dL — ABNORMAL HIGH (ref 70–99)
Glucose-Capillary: 253 mg/dL — ABNORMAL HIGH (ref 70–99)

## 2019-01-28 LAB — CBC
HCT: 23.6 % — ABNORMAL LOW (ref 36.0–46.0)
Hemoglobin: 8 g/dL — ABNORMAL LOW (ref 12.0–15.0)
MCH: 28.5 pg (ref 26.0–34.0)
MCHC: 33.9 g/dL (ref 30.0–36.0)
MCV: 84 fL (ref 80.0–100.0)
Platelets: 283 10*3/uL (ref 150–400)
RBC: 2.81 MIL/uL — ABNORMAL LOW (ref 3.87–5.11)
RDW: 14.3 % (ref 11.5–15.5)
WBC: 20.2 10*3/uL — ABNORMAL HIGH (ref 4.0–10.5)
nRBC: 0 % (ref 0.0–0.2)

## 2019-01-28 LAB — MAGNESIUM: Magnesium: 3.1 mg/dL — ABNORMAL HIGH (ref 1.7–2.4)

## 2019-01-28 LAB — D-DIMER, QUANTITATIVE: D-Dimer, Quant: 1.78 ug/mL-FEU — ABNORMAL HIGH (ref 0.00–0.50)

## 2019-01-28 LAB — PROCALCITONIN: Procalcitonin: 0.6 ng/mL

## 2019-01-28 LAB — C-REACTIVE PROTEIN: CRP: 20.6 mg/dL — ABNORMAL HIGH (ref <1.0)

## 2019-01-28 LAB — TRIGLYCERIDES: Triglycerides: 132 mg/dL (ref <150)

## 2019-01-28 MED ORDER — FLUCONAZOLE IN SODIUM CHLORIDE 200-0.9 MG/100ML-% IV SOLN
200.0000 mg | INTRAVENOUS | Status: DC
  Filled 2019-01-28: qty 100

## 2019-01-28 MED ORDER — METHYLPREDNISOLONE SODIUM SUCC 40 MG IJ SOLR
40.0000 mg | Freq: Three times a day (TID) | INTRAMUSCULAR | Status: DC
  Administered 2019-01-28 – 2019-01-30 (×7): 40 mg via INTRAVENOUS
  Filled 2019-01-28 (×7): qty 1

## 2019-01-28 MED ORDER — INSULIN ASPART 100 UNIT/ML ~~LOC~~ SOLN
3.0000 [IU] | SUBCUTANEOUS | Status: DC
  Administered 2019-01-28 – 2019-01-29 (×6): 3 [IU] via SUBCUTANEOUS

## 2019-01-28 MED ORDER — FLUCONAZOLE IN SODIUM CHLORIDE 400-0.9 MG/200ML-% IV SOLN
800.0000 mg | Freq: Once | INTRAVENOUS | Status: AC
  Administered 2019-01-28: 800 mg via INTRAVENOUS
  Filled 2019-01-28 (×2): qty 400

## 2019-01-28 MED ORDER — FLUCONAZOLE IN SODIUM CHLORIDE 400-0.9 MG/200ML-% IV SOLN: 400.0000 mg | INTRAVENOUS | Status: DC

## 2019-01-28 MED ORDER — INSULIN DETEMIR 100 UNIT/ML ~~LOC~~ SOLN
10.0000 [IU] | Freq: Two times a day (BID) | SUBCUTANEOUS | Status: DC
  Administered 2019-01-28 (×2): 10 [IU] via SUBCUTANEOUS
  Filled 2019-01-28 (×3): qty 0.1

## 2019-01-28 NOTE — Progress Notes (Signed)
Pt husband called requesting updates and to FaceTime. Pt nods appropriately to their questions and would blink to their responses. Family very supportive and encouraging in her treatment and care.

## 2019-01-28 NOTE — Progress Notes (Signed)
PROGRESS NOTE  Maureen Price YHC:[id number] DOB: 1976/04/29 DOA: 01/22/2019  PCP: [name], MD  Brief History/Interval Summary: - Pt. with PMH of Asthma, poorly controlled type II DM, GERD, depression, CAD S/P PCI, active smoker, obesity; admitted on 01/22/2019, presented with complaint of perineal pain, was found to have Fournier's gangrene.  S/P incision and debridement 01/23/2019.  Her SARS-CoV-2 was positive.  01/25/2019 patient comes more hypoxic unable to maintain adequate saturation on nonrebreather.    She had to be transferred to the ICU.  She was intubated and then subsequently transferred to the [facility].    Reason for Visit: Acute respiratory failure with hypoxia secondary to COVID-19  Subjective/Interval History: Patient remains intubated and sedated. no significant events overnight per staff  Assessment/Plan:  Acute Hypoxic Resp. Failure due to Acute Covid 19  Vent Mode: PRVC, currently on CPAP trial FiO2 (%):  [40 %-50 %] 40 % Set Rate:  [28 bmp] 28 bmp Vt Set:  [440 mL] 440 mL PEEP:  [5 cmH20] 5 cmH20 Pressure Support:  [12 cmH20-15 cmH20] 12 cmH20 Plateau Pressure:  [21 cmH20-30 cmH20] 29 cmH20     Component Value Date/Time   PHART 7.341 (L) 01/27/2019 0512   PCO2ART 39.1 01/27/2019 0512   PO2ART 109.0 (H) 01/27/2019 0512   HCO3 21.2 01/27/2019 0512   TCO2 22 01/27/2019 0512   ACIDBASEDEF 4.0 (H) 01/27/2019 0512   O2SAT 98.0 01/27/2019 0512    COVID-19 Labs  Recent Labs    01/26/19 0341 01/27/19 0535 01/27/19 0545 01/28/19 0151  DDIMER 2.57* 2.34*  --  1.78*  FERRITIN  --   --  293 237  CRP 34.7*  --  27.1* 20.6*    Lab Results  Component Value Date   SARSCOV2NAA POSITIVE (A) 01/23/2019     Fever: Afebrile the last 24 hours.  Last fever was 103.3 F on 5/20. Oxygen requirements: Remains on mechanical ventilation.  40% FiO2.  Saturating in the 90s.   Antibiotics: Empirically on vancomycin aztreonam.  Clindamycin was  discontinued.  Aztreonam was changed to meropenem. Steroids: Initiated on Solu-Medrol on 5/21  Diuretics: Lasix 60 mg IV x1 01/27/2019 Remdesivir: Initiated on 5/20 Actemra: Not given due to recent severe infection (Fournier's) Convalescent Plasma: Not yet Vitamin C and Zinc: On vitamin C   Patient remains on mechanical ventilation.  Noted to be on pressure support this morning.  Pulmonology is following and managing ventilator.  Good oxygenation. Patient not given Actemra due to recent severe infection in the form of Fournier's gangrene.  She has been started on Solu-Medrol, and reamed to severe as well, continue to follow inflammatory markers, trending down, CRP is down to 20.6 today, - patient  is on higher dose Lovenox per COVID 19 ICU protocol DVT prophylaxis . -P chest x-ray this morning showing persistent severe multilobular pneumonia  Fournier's gangrene/sepsis present on admission - She underwent surgical debridement on 5/18.  Continue with the dressing changes wet  to dry  -Intraoperative culture was noted patient was initially on broad-spectrum coverage with vancomycin and aztreonam and clindamycin.  Intraoperative culture growing Streptococcus angiosis and MSSA, vancomycin has been stopped, she is currently on meropenem , intraoperative cultures growing fungus today, she is started on fluconazole .  Diabetes mellitus type 2 uncontrolled with peripheral vascular disease Apparently patient is noncompliant with insulin.  Has had a few episodes of hypoglycemia during this hospitalization.  Her long-acting insulin was discontinued yesterday.   -BG remains elevated, will will increase her  long-acting insulin and changed to twice daily dosing, will start on 3 units NovoLog every 4 hours as well . -Poorly controlled, HbA1c 11.3.    Normocytic anemia Drop in hemoglobin is noted.  No evidence of overt bleeding.  Could be surgical loss versus acute illness versus hemodilution.  We will check  anemia panel.  Patient with known history of B12 deficiency.  Acute renal failure with hypokalemia and hypomagnesemia -Continue up to 1.6 today, hold nephrotoxic medications, to keep on the dry side given her COVID-19 , he did receive 60 mg of IV Lasix yesterday , although not giving any Lasix today . -  Echocardiogram done in 2018 showed normal systolic function.  Coronary artery disease Stable.  She also has a history of peripheral vascular disease.  Apparently history of PCI as well.  Had MI in 2015.  Previously on aspirin and Plavix but the last few notes from PCP did not mention Plavix.  Continue only aspirin for now.     History of asthma Currently stable.  Morbid obesity BMI 37.36.  History of active smoking Will need counseling eventually  FEN Remains on IV fluids.  Replace potassium.  On tube feedings.   DVT Prophylaxis: On higher dose of Lovenox per COVID ICU protocol  Lab Results  Component Value Date   PLT 283 01/28/2019     PUD Prophylaxis: Protonix Code Status: Full code Family Communication: Will D/W family today. Disposition Plan: Will remain in ICU     Consultants: Pulmonology.  General surgery has been following  Procedures: Incision and debridement of necrotizing soft tissue in the perineum and buttock area on 5/18  Antibiotics: Anti-infectives (From admission, onward)   Start     Dose/Rate Route Frequency Ordered Stop   01/29/19 1000  fluconazole (DIFLUCAN) IVPB 200 mg     200 mg 100 mL/hr over 60 Minutes Intravenous Every 24 hours 01/28/19 0923     01/28/19 1000  fluconazole (DIFLUCAN) IVPB 800 mg     800 mg 100 mL/hr over 240 Minutes Intravenous  Once 01/28/19 0810     01/28/19 0745  fluconazole (DIFLUCAN) IVPB 400 mg  Status:  Discontinued     400 mg 100 mL/hr over 120 Minutes Intravenous Every 24 hours 01/28/19 0740 01/28/19 0810   01/27/19 1430  remdesivir 100 mg in sodium chloride 0.9 % 230 mL IVPB     100 mg over 30 Minutes Intravenous  Every 24 hours 01/27/19 1339 02/05/19 1429   01/26/19 2200  meropenem (MERREM) 1 g in sodium chloride 0.9 % 100 mL IVPB     1 g 200 mL/hr over 30 Minutes Intravenous Every 8 hours 01/26/19 1453     01/26/19 1415  remdesivir 100 mg in sodium chloride 0.9 % 250 mL IVPB  Status:  Discontinued     100 mg over 30 Minutes Intravenous Every 24 hours 01/25/19 1421 01/27/19 1340   01/26/19 1400  vancomycin (VANCOCIN) IVPB 750 mg/150 ml premix  Status:  Discontinued     750 mg 150 mL/hr over 60 Minutes Intravenous Every 12 hours 01/26/19 1326 01/27/19 1330   01/25/19 1430  remdesivir 200 mg in sodium chloride 0.9 % 250 mL IVPB     200 mg over 30 Minutes Intravenous Once 01/25/19 1421 01/25/19 1949   01/24/19 2200  vancomycin (VANCOCIN) 1,250 mg in sodium chloride 0.9 % 250 mL IVPB  Status:  Discontinued     1,250 mg 166.7 mL/hr over 90 Minutes Intravenous Every 12 hours 01/24/19 1508  01/26/19 1046   01/23/19 1215  aztreonam (AZACTAM) 2 g in sodium chloride 0.9 % 100 mL IVPB  Status:  Discontinued     2 g 200 mL/hr over 30 Minutes Intravenous To Surgery 01/23/19 1207 01/23/19 1350   01/23/19 1200  vancomycin (VANCOCIN) IVPB 1000 mg/200 mL premix  Status:  Discontinued     1,000 mg 200 mL/hr over 60 Minutes Intravenous Every 12 hours 01/22/19 2359 01/24/19 1508   01/23/19 0800  clindamycin (CLEOCIN) IVPB 900 mg  Status:  Discontinued     900 mg 100 mL/hr over 30 Minutes Intravenous Every 8 hours 01/23/19 0035 01/26/19 1451   01/23/19 0000  clindamycin (CLEOCIN) IVPB 900 mg     900 mg 100 mL/hr over 30 Minutes Intravenous  Once 01/22/19 2350 01/23/19 0101   01/23/19 0000  aztreonam (AZACTAM) 2 g in sodium chloride 0.9 % 100 mL IVPB  Status:  Discontinued     2 g 200 mL/hr over 30 Minutes Intravenous Every 8 hours 01/22/19 2353 01/26/19 1453   01/23/19 0000  vancomycin (VANCOCIN) 1,500 mg in sodium chloride 0.9 % 500 mL IVPB     1,500 mg 250 mL/hr over 120 Minutes Intravenous  Once 01/22/19 2353  01/23/19 1930        Medications:  Scheduled: . aspirin  81 mg Per Tube Daily  . chlorhexidine gluconate (MEDLINE KIT)  15 mL Mouth Rinse BID  . Chlorhexidine Gluconate Cloth  6 each Topical Daily  . enoxaparin (LOVENOX) injection  0.5 mg/kg Subcutaneous Q12H  . feeding supplement (PRO-STAT SUGAR FREE 64)  60 mL Per Tube TID  . feeding supplement (VITAL HIGH PROTEIN)  1,000 mL Per Tube Q24H  . fentaNYL (SUBLIMAZE) injection  100 mcg Intravenous Once  . insulin aspart  0-20 Units Subcutaneous Q4H  . insulin aspart  3 Units Subcutaneous Q4H  . insulin detemir  10 Units Subcutaneous BID  . mouth rinse  15 mL Mouth Rinse 10 times per day  . methylPREDNISolone (SOLU-MEDROL) injection  40 mg Intravenous Q8H  . multivitamin with minerals  1 tablet Per Tube Daily  . pantoprazole sodium  40 mg Per Tube Daily  . polyethylene glycol  17 g Per Tube Daily  . rocuronium  80 mg Intravenous Once  . sodium chloride flush  10-40 mL Intracatheter Q12H  . vitamin B-12  2,000 mcg Oral Daily  . vitamin C  1,000 mg Per Tube Daily   Continuous: . sodium chloride 100 mL/hr at 01/28/19 0922  . fentaNYL infusion INTRAVENOUS 150 mcg/hr (01/28/19 0700)  . [START ON 01/29/2019] fluconazole (DIFLUCAN) IV    . fluconazole (DIFLUCAN) IV 800 mg (01/28/19 0933)  . meropenem (MERREM) IV Stopped (01/28/19 4097)  . propofol (DIPRIVAN) infusion 25 mcg/kg/min (01/28/19 0824)  . remdesivir 100 mg in NS 250 mL     DZH:GDJMEQASTMHDQ (TYLENOL) oral liquid 160 mg/5 mL, albuterol, ALPRAZolam, fentaNYL, midazolam, sodium chloride flush   Objective:  Vital Signs  Vitals:   01/28/19 0800 01/28/19 0932 01/28/19 1038 01/28/19 1110  BP: 138/68   (!) 157/77  Pulse: 84   (!) 101  Resp: (!) 28   (!) 23  Temp: 98.6 F (37 C)     TempSrc: Axillary     SpO2: 97% 98% 98% 98%  Weight:      Height:        Intake/Output Summary (Last 24 hours) at 01/28/2019 1130 Last data filed at 01/28/2019 1000 Gross per 24 hour   Intake 4215.26 ml  Output 3527 ml  Net 688.26 ml   Filed Weights   01/23/19 0430 01/24/19 0600 01/27/19 0351  Weight: 95.2 kg 103.4 kg 117.4 kg   Debated, sedated, opens eyes when her sedation is minimized Symmetrical Chest wall movement, Good air movement bilaterally, no wheezing RRR,No Gallops,Rubs or new Murmurs, No Parasternal Heave +ve B.Sounds, Abd Soft, No tenderness, No rebound - guarding or rigidity. No Cyanosis, Clubbing or edema, No new Rash or bruise     Lab Results:  Data Reviewed: I have personally reviewed following labs and imaging studies  CBC: Recent Labs  Lab 01/22/19 2037 01/23/19 0513 01/25/19 0545  01/26/19 0341 01/26/19 1122 01/26/19 1600 01/27/19 0512 01/27/19 0535 01/28/19 0151  WBC 23.4* 25.8* 23.7*  --  18.9*  --   --   --  20.7* 20.2*  NEUTROABS 19.6*  --  19.4*  --   --   --   --   --   --   --   HGB 13.3 12.1  12.3 10.9*   < > 8.6* 7.8* 8.3* 8.5* 8.3* 8.0*  HCT 38.7 34.1* 31.4*   < > 25.3* 23.0* 23.8* 25.0* 24.3* 23.6*  MCV 80.1 78.4* 79.7*  --  82.4  --   --   --  82.9 84.0  PLT 251 254 257  --  241  --   --   --  271 283   < > = values in this interval not displayed.    Basic Metabolic Panel: Recent Labs  Lab 01/23/19 1436  01/25/19 0545  01/26/19 0341 01/26/19 1122 01/26/19 1600 01/27/19 0512 01/27/19 0535 01/27/19 1607 01/28/19 0150 01/28/19 0151  NA 136  --  137   < > 141 141  --  142 142  --   --  144  K 3.4*  --  3.2*   < > 3.2* 3.8  --  3.5 3.2*  --   --  3.9  CL 104  --  103  --  110  --   --   --  112*  --   --  114*  CO2 18*  --  24  --  24  --   --   --  19*  --   --  22  GLUCOSE 259*  --  219*  --  77  --   --   --  230*  --   --  267*  BUN 6  --  <5*  --  11  --   --   --  34*  --   --  51*  CREATININE 0.59  --  0.49  --  1.12*  --   --   --  1.52*  --   --  1.60*  CALCIUM 8.3*  --  8.0*  --  7.4*  --   --   --  6.8*  --   --  7.8*  MG  --    < > 1.4*  --  1.5*  --  1.3*  --  2.0 1.1* 3.1*  --   PHOS 3.4  --   3.3  --   --   --  3.3  --  4.1 2.1*  --   --    < > = values in this interval not displayed.    GFR: Estimated Creatinine Clearance: 59.2 mL/min (A) (by C-G formula based on SCr of 1.6 mg/dL (H)).  Liver Function Tests: Recent Labs  Lab 01/22/19 2037 01/23/19  1436 01/25/19 0545 01/26/19 0341 01/27/19 0535 01/28/19 0151  AST 18  --   --  36 24 16  ALT 23  --   --  35 35 30  ALKPHOS 134*  --   --  105 110 100  BILITOT 0.8  --   --  0.8 0.2* 0.3  PROT 6.9  --   --  5.7* 5.5* 5.9*  ALBUMIN 2.7* 2.1* 1.8* 2.0* 1.7* 1.7*     Coagulation Profile: Recent Labs  Lab 01/23/19 0019  INR 1.3*    Cardiac Enzymes: Recent Labs  Lab 01/23/19 0513  TROPONINI 0.03*    HbA1C: No results for input(s): HGBA1C in the last 72 hours.  CBG: Recent Labs  Lab 01/27/19 1538 01/27/19 1953 01/28/19 0047 01/28/19 0350 01/28/19 0829  GLUCAP 254* 232* 253* 230* 150*    Lipid Profile: Recent Labs    01/27/19 0535 01/28/19 0151  TRIG 129 132    Anemia Panel: Recent Labs    01/27/19 0545 01/28/19 0151  FERRITIN 293 237    Recent Results (from the past 240 hour(s))  Culture, blood (routine x 2)     Status: None   Collection Time: 01/22/19  8:40 PM  Result Value Ref Range Status   Specimen Description BLOOD LEFT HAND  Final   Special Requests   Final    BOTTLES DRAWN AEROBIC AND ANAEROBIC Blood Culture adequate volume   Culture   Final    NO GROWTH 5 DAYS Performed at [facility], [place] [address], [place], [place] [address]    Report Status 01/27/2019 FINAL  Final  Culture, blood (routine x 2)     Status: None   Collection Time: 01/22/19  8:58 PM  Result Value Ref Range Status   Specimen Description BLOOD RIGHT ANTECUBITAL  Final   Special Requests   Final    BOTTLES DRAWN AEROBIC ONLY Blood Culture adequate volume   Culture   Final    NO GROWTH 5 DAYS Performed at [facility], [place] [address] [place], [place] [address]    Report Status 01/27/2019  FINAL  Final  SARS Coronavirus 2 (CEPHEID - Performed in [place] hospital lab), Hosp Order     Status: Abnormal   Collection Time: 01/23/19  1:19 AM  Result Value Ref Range Status   SARS Coronavirus 2 POSITIVE (A) NEGATIVE Final    Comment: RESULT CALLED TO, READ BACK BY AND VERIFIED WITH: R FITZGERAKD ANSTI _0  01/23/19 BY S GEZAHEGN (NOTE) If result is NEGATIVE SARS-CoV-2 target nucleic acids are NOT DETECTED. The SARS-CoV-2 RNA is generally detectable in upper and lower  respiratory specimens during the acute phase of infection. The lowest  concentration of SARS-CoV-2 viral copies this assay can detect is 250  copies / mL. A negative result does not preclude SARS-CoV-2 infection  and should not be used as the sole basis for treatment or other  patient management decisions.  A negative result may occur with  improper specimen collection / handling, submission of specimen other  than nasopharyngeal swab, presence of viral mutation(s) within the  areas targeted by this assay, and inadequate number of viral copies  (<250 copies / mL). A negative result must be combined with clinical  observations, patient history, and epidemiological information. If result is POSITIVE SARS-CoV-2 target nucleic acids are D ETECTED. The SARS-CoV-2 RNA is generally detectable in upper and lower  respiratory specimens during the acute phase of infection.  Positive  results are  indicative of active infection with SARS-CoV-2.  Clinical  correlation with patient history and other diagnostic information is  necessary to determine patient infection status.  Positive results do  not rule out bacterial infection or co-infection with other viruses. If result is PRESUMPTIVE POSTIVE SARS-CoV-2 nucleic acids MAY BE PRESENT.   A presumptive positive result was obtained on the submitted specimen  and confirmed on repeat testing.  While 2019 novel coronavirus  (SARS-CoV-2) nucleic acids may be present in the  submitted sample  additional confirmatory testing may be necessary for epidemiological  and / or clinical management purposes  to differentiate between  SARS-CoV-2 and other Sarbecovirus currently known to infect humans.  If clinically indicated additional testing with an alternate test  methodology [id number]) is advised. The SARS-CoV-2 RNA is generally  detectable in upper and lower respiratory specimens during the acute  phase of infection. The expected result is Negative. Fact Sheet for Patients:  StrictlyIdeas.no Fact Sheet for Healthcare Providers: BankingDealers.co.za This test is not yet approved or cleared by the [place] FDA and has been authorized for detection and/or diagnosis of SARS-CoV-2 by FDA under an Emergency Use Authorization (EUA).  This EUA will remain in effect (meaning this test can be used) for the duration of the COVID-19 declaration under Section 564(b)(1) of the Act, 21 U.S.C. section 360bbb-3(b)(1), unless the authorization is terminated or revoked sooner. Performed at [facility], [place] [address], [place], [place] [address]   Aerobic/Anaerobic Culture (surgical/deep wound)     Status: None   Collection Time: 01/23/19 12:16 PM  Result Value Ref Range Status   Specimen Description ABSCESS PERINEUM  Final   Special Requests PATIENT ON FOLLOWING CLEOCIN AND AZACTAM  Final   Gram Stain   Final    ABUNDANT WBC PRESENT,BOTH PMN AND MONONUCLEAR ABUNDANT GRAM NEGATIVE RODS MODERATE GRAM VARIABLE ROD FEW GRAM POSITIVE COCCI Performed at [facility], [place] [address] [place], [place] [address]    Culture   Final    FEW STREPTOCOCCUS ANGINOSIS MIXED ANAEROBIC FLORA PRESENT.  CALL LAB IF FURTHER IID REQUIRED.    Report Status 01/26/2019 FINAL  Final   Organism ID, Bacteria STREPTOCOCCUS ANGINOSIS  Final      Susceptibility   Streptococcus anginosis - MIC*    PENICILLIN 0.12 SENSITIVE Sensitive      CEFTRIAXONE 0.5 SENSITIVE Sensitive     ERYTHROMYCIN >=8 RESISTANT Resistant     LEVOFLOXACIN 1 SENSITIVE Sensitive     VANCOMYCIN 0.5 SENSITIVE Sensitive     * FEW STREPTOCOCCUS ANGINOSIS  Fungus Culture With Stain     Status: None   Collection Time: 01/23/19 12:41 PM  Result Value Ref Range Status   Fungus Stain Final report  Final   Fungus (Mycology) Culture Preliminary report  Final    Comment: RESULT CALLED TO, READ BACK BY AND VERIFIED WITH: RN Mellody Drown [id number] (NOTE) REPORTED POSITIVE RESULT TO LISA PITT AT 7:40 PM 01/27/2019 FAXED TO [phone] Performed At: [facility] [address] [place], [place] [id number] Rush Farmer MD TI:[id number]    Fungal Source TISSUE  Final    Comment: PERINEUM Performed at [facility], [place] [address], [place], [place] [address]   Aerobic/Anaerobic Culture (surgical/deep wound)     Status: None   Collection Time: 01/23/19 12:41 PM  Result Value Ref Range Status   Specimen Description TISSUE PERINEUM  Final   Special Requests PATIENT ON FOLLOWING CLEOCIN AND AZACTAM  Final  Gram Stain   Final    MODERATE WBC PRESENT,BOTH PMN AND MONONUCLEAR ABUNDANT GRAM NEGATIVE RODS FEW GRAM VARIABLE ROD FEW GRAM POSITIVE COCCI    Culture   Final    RARE STAPHYLOCOCCUS AUREUS RARE STREPTOCOCCUS ANGINOSIS ABUNDANT ANAEROBIC GRAM NEGATIVE ROD BETA LACTAMASE POSITIVE Performed at [facility], [place] [address] [place],  [address]    Report Status 01/27/2019 FINAL  Final   Organism ID, Bacteria STREPTOCOCCUS ANGINOSIS  Final   Organism ID, Bacteria STAPHYLOCOCCUS AUREUS  Final      Susceptibility   Staphylococcus aureus - MIC*    CIPROFLOXACIN <=0.5 SENSITIVE Sensitive     ERYTHROMYCIN <=0.25 SENSITIVE Sensitive     GENTAMICIN <=0.5 SENSITIVE Sensitive     OXACILLIN <=0.25 SENSITIVE Sensitive     TETRACYCLINE <=1 SENSITIVE Sensitive     VANCOMYCIN <=0.5 SENSITIVE Sensitive     TRIMETH/SULFA <=10  SENSITIVE Sensitive     CLINDAMYCIN <=0.25 SENSITIVE Sensitive     RIFAMPIN <=0.5 SENSITIVE Sensitive     Inducible Clindamycin NEGATIVE Sensitive     * RARE STAPHYLOCOCCUS AUREUS   Streptococcus anginosis - MIC*    PENICILLIN <=0.06 SENSITIVE Sensitive     CEFTRIAXONE 0.5 SENSITIVE Sensitive     ERYTHROMYCIN >=8 RESISTANT Resistant     LEVOFLOXACIN 0.5 SENSITIVE Sensitive     VANCOMYCIN 0.5 SENSITIVE Sensitive     * RARE STREPTOCOCCUS ANGINOSIS  Fungus Culture Result     Status: None   Collection Time: 01/23/19 12:41 PM  Result Value Ref Range Status   Result 1 Comment  Final    Comment: (NOTE) KOH/Calcofluor preparation:  no fungus observed. Performed At: [facility] [place], [place] [id number] Rush Farmer MD AO:[id number]   Fungal organism reflex     Status: Abnormal   Collection Time: 01/23/19 12:41 PM  Result Value Ref Range Status   Fungal result 1 Comment (A)  Final    Comment: (NOTE) Yeast isolated, identification in progress. Performed At: [facility] [place], [place] [id number] Rush Farmer MD LK:[id number]       Radiology Studies: Dg Abd 1 View  Result Date: 01/27/2019 CLINICAL DATA:  Orogastric tube placement. EXAM: ABDOMEN - 1 VIEW COMPARISON:  CT abdomen pelvis dated Jan 22, 2019. FINDINGS: Enteric tube tip in the distal stomach near the pylorus. There are several borderline dilated loops of air-filled small bowel in the central and left abdomen. No radio-opaque calculi or other significant radiographic abnormality are seen. No acute osseous abnormality. IMPRESSION: 1. Enteric tube tip near the pylorus. 2. Several borderline dilated loops of air-filled small bowel are nonspecific and could reflect developing ileus. Electronically Signed   By: Titus Dubin M.D.   On: 01/27/2019 10:56   Dg Chest Port 1 View  Result Date: 01/28/2019 CLINICAL DATA:  43 year old female with history of pneumonia from COVID-19.  EXAM: PORTABLE CHEST 1 VIEW COMPARISON:  Chest x-ray 01/25/2019. FINDINGS: Low-lying endotracheal tube with tip approximately 9 mm above the carina. A nasogastric tube is seen extending into the stomach, however, the tip of the nasogastric tube extends below the lower margin of the image. Lung volumes are low. Patchy multifocal interstitial and airspace disease noted throughout the lungs bilaterally. No pleural effusions. No cephalization of the pulmonary vasculature. Heart size is borderline enlarged. Upper mediastinal contours are within normal limits. IMPRESSION: 1. Support apparatus, as above. Please take note of the low position of the endotracheal tube and consider withdrawal approximately 4 cm  for more optimal placement. 2. Severe multilobar pneumonia redemonstrated, as above. Electronically Signed   By: Vinnie Langton M.D.   On: 01/28/2019 07:35       LOS: 5 days   Phillips Climes MD   [facility] Pager on [email or web address]  01/28/2019, 11:30 AM

## 2019-01-28 NOTE — Progress Notes (Signed)
NAME:  Maureen Price, MRN:  [id number], DOB:  1975-11-11, LOS: 5 ADMISSION DATE:  01/22/2019, CONSULTATION DATE: May 19 REFERRING MD: Dr. Posey Pronto, CHIEF COMPLAINT: Shortness of breath  Brief History   43 year old female with poorly controlled diabetes initially admitted on May 17 for Fournier's gangrene requiring surgical debridement, found to be COVID positive.  Developed ARDS on May 20 requiring intubation, transferred to [facility] the same day.   Past Medical History  Diabetes mellitus type 2 Asthma Gastroesophageal reflux disease Depression Coronary artery disease Obesity Tobacco abuse  Significant Hospital Events   May 17 admitted for Fournier's gangrene May 18 surgical debridement May 20 worsening hypoxemia, ARDS, intubated, transferred to [place]:  General surgery PCCM  Procedures:  May 18 surgical debridement of gangrenous soft tissue  Significant Diagnostic Tests:  May 17 CT abdomen pelvis subcutaneous emphysema of soft tissue around the right aspect of anus, perineum, posterior right vulva, there is no discrete fluid collection appreciated however there was concern for gas-forming infection  Micro Data:  SARS COV 2 5/18: Positive 5/17: Blood cultures x2>>> negative 5/18: Fungal stain from abscess>>> 5/18: Fungal culture>>> positive >>  5/18: Abscess culture from deep wound >>  few Streptococcus anginosis and rare staph aureus  Antimicrobials:  Aztreonam May 17 > 5/21 Clindamycin May 17 > 5/21 Meropenem 5/21 >>  Vancomycin May 17 > 5/22 Remdesivir May 19 > 5/22  Interim history/subjective:  Remains on 0.40 + PEEP 5 She was apneic on attempted SBT first thing this morning (sedation running) Vancomycin stopped on 5/22 Hyperglycemic  Objective   Blood pressure (!) 157/77, pulse (!) 101, temperature 98.6 F (37 C), temperature source Axillary, resp. rate (!) 23, height 5' 5.5" (1.664 m), weight 117.4 kg, last  menstrual period 12/29/2018, SpO2 98 %. CVP:  [9 mmHg-13 mmHg] 12 mmHg  Vent Mode: PSV;CPAP FiO2 (%):  [40 %] 40 % Set Rate:  [28 bmp] 28 bmp Vt Set:  [440 mL] 440 mL PEEP:  [5 cmH20] 5 cmH20 Pressure Support:  [12 cmH20-15 cmH20] 12 cmH20 Plateau Pressure:  [21 cmH20-30 cmH20] 29 cmH20   Intake/Output Summary (Last 24 hours) at 01/28/2019 1201 Last data filed at 01/28/2019 1000 Gross per 24 hour  Intake 4129.8 ml  Output 3527 ml  Net 602.8 ml   Filed Weights   01/23/19 0430 01/24/19 0600 01/27/19 0351  Weight: 95.2 kg 103.4 kg 117.4 kg    Examination:  General: Obese woman, ventilated, fairly comfortable HENT: ET tube, OG tube in place, no oral lesions PULM: Coarse bilateral breath sounds, no wheeze CV: Regular, no murmur GI: Obese, soft, nondistended with hypoactive bowel sounds GU: Perineal wound examined on 5/22 at the time of dressing change, packing removed.  No overt ischemic areas noted.  There is some white/tan secretions, some granulation tissue forming.  No odor MSK: No deformities Neuro: Does wake to voice and vigorous stimulation, nodded to questions, did not follow commands (on sedation)    Resolved Hospital Problem list     Assessment & Plan:  ARDS due to COVID-19: Oxygenation improved this morning Continue PRVC at 6 cc/kg per ARDS protocol She looks ready to lighten sedation, push for SBT.  May require more volume removal before we would be ready for extubation Has not required proning thus far Attempt to try lighten sedation, acknowledge that she will need pain control for her perineal wound VAP prevention orders Serum creatinine still has not plateaued.  Defer diuresis again on 5/23  Actemra was deferred given her active infection.  Corticosteroids are ordered Remdesivir day 5  Necrotizing fasciitis: Continue wet-to-dry dressing changes and packing for now.  We may need to have CCS do a video examination at a future dressing change to ensure that the  wound looks good Continue meropenem, vanco stopped. Add fluconazole given fungal cx on wound  Acute renal failure, nonoliguric.  Due to severe sepsis, recent active diuresis: Continue to hold off on diuresis, reinitiate when stable to do so Ensure adequate renal perfusion Avoid nephrotoxins  Nutrition: TF as ordered      Best practice:  Diet: TF Pain/Anxiety/Delirium protocol (if indicated): yes, RASS goal -2 via PAD protocol VAP protocol (if indicated): yes DVT prophylaxis: lovenox GI prophylaxis: pantoprazole Glucose control: glucose control Mobility: bed rest Code Status: full Family Communication: per Dr Izora Gala today Disposition: remain in ICU  Labs   CBC: Recent Labs  Lab 01/22/19 2037 01/23/19 0513 01/25/19 0545  01/26/19 0341 01/26/19 1122 01/26/19 1600 01/27/19 0512 01/27/19 0535 01/28/19 0151  WBC 23.4* 25.8* 23.7*  --  18.9*  --   --   --  20.7* 20.2*  NEUTROABS 19.6*  --  19.4*  --   --   --   --   --   --   --   HGB 13.3 12.1  12.3 10.9*   < > 8.6* 7.8* 8.3* 8.5* 8.3* 8.0*  HCT 38.7 34.1* 31.4*   < > 25.3* 23.0* 23.8* 25.0* 24.3* 23.6*  MCV 80.1 78.4* 79.7*  --  82.4  --   --   --  82.9 84.0  PLT 251 254 257  --  241  --   --   --  271 283   < > = values in this interval not displayed.    Basic Metabolic Panel: Recent Labs  Lab 01/23/19 1436  01/25/19 0545  01/26/19 0341 01/26/19 1122 01/26/19 1600 01/27/19 0512 01/27/19 0535 01/27/19 1607 01/28/19 0150 01/28/19 0151  NA 136  --  137   < > 141 141  --  142 142  --   --  144  K 3.4*  --  3.2*   < > 3.2* 3.8  --  3.5 3.2*  --   --  3.9  CL 104  --  103  --  110  --   --   --  112*  --   --  114*  CO2 18*  --  24  --  24  --   --   --  19*  --   --  22  GLUCOSE 259*  --  219*  --  77  --   --   --  230*  --   --  267*  BUN 6  --  <5*  --  11  --   --   --  34*  --   --  51*  CREATININE 0.59  --  0.49  --  1.12*  --   --   --  1.52*  --   --  1.60*  CALCIUM 8.3*  --  8.0*  --  7.4*  --    --   --  6.8*  --   --  7.8*  MG  --    < > 1.4*  --  1.5*  --  1.3*  --  2.0 1.1* 3.1*  --   PHOS 3.4  --  3.3  --   --   --  3.3  --  4.1 2.1*  --   --    < > = values in this interval not displayed.   GFR: Estimated Creatinine Clearance: 59.2 mL/min (A) (by C-G formula based on SCr of 1.6 mg/dL (H)). Recent Labs  Lab 01/22/19 2037 01/22/19 2222 01/23/19 0019  01/25/19 0545 01/26/19 0341 01/27/19 0535 01/28/19 0151  PROCALCITON  --   --  0.24  --   --   --  0.84 0.60  WBC 23.4*  --   --    < > 23.7* 18.9* 20.7* 20.2*  LATICACIDVEN 1.9 1.2  --   --   --   --   --   --    < > = values in this interval not displayed.    Liver Function Tests: Recent Labs  Lab 01/22/19 2037 01/23/19 1436 01/25/19 0545 01/26/19 0341 01/27/19 0535 01/28/19 0151  AST 18  --   --  36 24 16  ALT 23  --   --  35 35 30  ALKPHOS 134*  --   --  105 110 100  BILITOT 0.8  --   --  0.8 0.2* 0.3  PROT 6.9  --   --  5.7* 5.5* 5.9*  ALBUMIN 2.7* 2.1* 1.8* 2.0* 1.7* 1.7*   No results for input(s): LIPASE, AMYLASE in the last 168 hours. No results for input(s): AMMONIA in the last 168 hours.  ABG    Component Value Date/Time   PHART 7.341 (L) 01/27/2019 0512   PCO2ART 39.1 01/27/2019 0512   PO2ART 109.0 (H) 01/27/2019 0512   HCO3 21.2 01/27/2019 0512   TCO2 22 01/27/2019 0512   ACIDBASEDEF 4.0 (H) 01/27/2019 0512   O2SAT 98.0 01/27/2019 0512     Coagulation Profile: Recent Labs  Lab 01/23/19 0019  INR 1.3*    Cardiac Enzymes: Recent Labs  Lab 01/23/19 0513  TROPONINI 0.03*    HbA1C: Hemoglobin A1C  Date/Time Value Ref Range Status  02/28/2018 08:45 AM 10.5 (A) 4.0 - 5.6 % Final  06/23/2017 10:16 AM 11.7  Final   Hgb A1c MFr Bld  Date/Time Value Ref Range Status  01/23/2019 02:36 PM 11.3 (H) 4.8 - 5.6 % Final    Comment:    (NOTE) Pre diabetes:          5.7%-6.4% Diabetes:              >6.4% Glycemic control for   <7.0% adults with diabetes   09/19/2016 03:46 AM 13.1 (H)  4.8 - 5.6 % Final    Comment:    (NOTE)         Pre-diabetes: 5.7 - 6.4         Diabetes: >6.4         Glycemic control for adults with diabetes: <7.0     CBG: Recent Labs  Lab 01/27/19 1538 01/27/19 1953 01/28/19 0047 01/28/19 0350 01/28/19 0829  GLUCAP 254* 232* 253* 230* 150*     Critical care time: 33 minutes     Baltazar Apo, MD, PhD 01/28/2019, 12:01 PM [facility] [phone] or if no answer [phone]

## 2019-01-28 NOTE — Progress Notes (Signed)
RT note: patient placed back on full support ventilation due to having a decrease in respiratory rate.  Tolerating well at this time.  Will continue to monitor.

## 2019-01-28 NOTE — Progress Notes (Signed)
Page sent to Dr. Loralee Pacas with results of positive fungus culture collected from patient's perineum abscess growing isolated yeast with identification in progress.

## 2019-01-28 NOTE — Progress Notes (Signed)
Assisted tele visit to patient with mother.  Salmaan Patchin, Philis Nettle, RN

## 2019-01-28 NOTE — Progress Notes (Signed)
RT note: attempted SBT on patient this AM on CPAP/PSV of 15/5.  Patient went apneic and backup ventilation alarm alarmed.  Placed back on full support ventilator settings.  Will continue to monitor.

## 2019-01-28 NOTE — Progress Notes (Signed)
RT note: patient placed on CPAP/PSV of 15/5 at 0930.   Currently tolerating well.  Will continue to monitor and wean as tolerated.

## 2019-01-28 NOTE — Progress Notes (Signed)
Pt family: mom and husband called. Updated about patients stable status. Mentioned no major changes have been made. Patient remains intubated and sedated. Family aware patient has been able to intermittently wake up and follow some commands. Family appears to be emotional and supportive.

## 2019-01-28 NOTE — Progress Notes (Signed)
PHARMACY NOTE:  ANTIMICROBIAL RENAL DOSAGE ADJUSTMENT  Current antimicrobial regimen includes a mismatch between antimicrobial dosage and estimated renal function.  As per policy approved by the Pharmacy & Therapeutics and Medical Executive Committees, the antimicrobial dosage will be adjusted accordingly.  Current antimicrobial dosage:  Fluconazole  Indication: Perineal abscess culture with +yeast, pending identification  Renal Function: Estimated Creatinine Clearance: 59.2 mL/min (A) (by C-G formula based on SCr of 1.6 mg/dL (H)). SCr increasing daily, continue to monitor trend.    Antimicrobial dosage has been changed to:  Fluconazole 800 mg IV loading dose x1 then 200 mg IV q24h.   Thank you for allowing pharmacy to be a part of this patient's care.  Gretta Arab PharmD, BCPS 01/28/2019 8:23 AM

## 2019-01-29 ENCOUNTER — Inpatient Hospital Stay (HOSPITAL_COMMUNITY): Payer: BC Managed Care – PPO

## 2019-01-29 LAB — GLUCOSE, CAPILLARY
Glucose-Capillary: 157 mg/dL — ABNORMAL HIGH (ref 70–99)
Glucose-Capillary: 186 mg/dL — ABNORMAL HIGH (ref 70–99)
Glucose-Capillary: 188 mg/dL — ABNORMAL HIGH (ref 70–99)
Glucose-Capillary: 198 mg/dL — ABNORMAL HIGH (ref 70–99)
Glucose-Capillary: 222 mg/dL — ABNORMAL HIGH (ref 70–99)
Glucose-Capillary: 230 mg/dL — ABNORMAL HIGH (ref 70–99)
Glucose-Capillary: 237 mg/dL — ABNORMAL HIGH (ref 70–99)

## 2019-01-29 LAB — COMPREHENSIVE METABOLIC PANEL
ALT: 40 U/L (ref 0–44)
AST: 39 U/L (ref 15–41)
Albumin: 1.7 g/dL — ABNORMAL LOW (ref 3.5–5.0)
Alkaline Phosphatase: 99 U/L (ref 38–126)
Anion gap: 6 (ref 5–15)
BUN: 54 mg/dL — ABNORMAL HIGH (ref 6–20)
CO2: 22 mmol/L (ref 22–32)
Calcium: 7.9 mg/dL — ABNORMAL LOW (ref 8.9–10.3)
Chloride: 120 mmol/L — ABNORMAL HIGH (ref 98–111)
Creatinine, Ser: 1.3 mg/dL — ABNORMAL HIGH (ref 0.44–1.00)
GFR calc Af Amer: 59 mL/min — ABNORMAL LOW (ref >60)
GFR calc non Af Amer: 51 mL/min — ABNORMAL LOW (ref >60)
Glucose, Bld: 285 mg/dL — ABNORMAL HIGH (ref 70–99)
Potassium: 4 mmol/L (ref 3.5–5.1)
Sodium: 148 mmol/L — ABNORMAL HIGH (ref 135–145)
Total Bilirubin: <0.1 mg/dL — ABNORMAL LOW (ref 0.3–1.2)
Total Protein: 5.9 g/dL — ABNORMAL LOW (ref 6.5–8.1)

## 2019-01-29 LAB — FOLATE: Folate: 14.7 ng/mL (ref >5.9)

## 2019-01-29 LAB — IRON AND TIBC
Iron: 33 ug/dL (ref 28–170)
Saturation Ratios: 26 % (ref 10.4–31.8)
TIBC: 127 ug/dL — ABNORMAL LOW (ref 250–450)
UIBC: 94 ug/dL

## 2019-01-29 LAB — CBC
HCT: 23.4 % — ABNORMAL LOW (ref 36.0–46.0)
Hemoglobin: 7.9 g/dL — ABNORMAL LOW (ref 12.0–15.0)
MCH: 28.4 pg (ref 26.0–34.0)
MCHC: 33.8 g/dL (ref 30.0–36.0)
MCV: 84.2 fL (ref 80.0–100.0)
Platelets: 284 10*3/uL (ref 150–400)
RBC: 2.78 MIL/uL — ABNORMAL LOW (ref 3.87–5.11)
RDW: 14.6 % (ref 11.5–15.5)
WBC: 12.2 10*3/uL — ABNORMAL HIGH (ref 4.0–10.5)
nRBC: 0 % (ref 0.0–0.2)

## 2019-01-29 LAB — RETICULOCYTES
Immature Retic Fract: 8.7 % (ref 2.3–15.9)
RBC.: 2.78 MIL/uL — ABNORMAL LOW (ref 3.87–5.11)
Retic Count, Absolute: 26.1 10*3/uL (ref 19.0–186.0)
Retic Ct Pct: 0.9 % (ref 0.4–3.1)

## 2019-01-29 LAB — TRIGLYCERIDES: Triglycerides: 167 mg/dL — ABNORMAL HIGH (ref <150)

## 2019-01-29 LAB — C-REACTIVE PROTEIN: CRP: 11.8 mg/dL — ABNORMAL HIGH (ref <1.0)

## 2019-01-29 LAB — FERRITIN: Ferritin: 227 ng/mL (ref 11–307)

## 2019-01-29 LAB — VITAMIN B12: Vitamin B-12: 6256 pg/mL — ABNORMAL HIGH (ref 180–914)

## 2019-01-29 LAB — D-DIMER, QUANTITATIVE: D-Dimer, Quant: 1.85 ug/mL-FEU — ABNORMAL HIGH (ref 0.00–0.50)

## 2019-01-29 MED ORDER — INSULIN DETEMIR 100 UNIT/ML ~~LOC~~ SOLN
20.0000 [IU] | Freq: Two times a day (BID) | SUBCUTANEOUS | Status: DC
  Administered 2019-01-29 (×2): 20 [IU] via SUBCUTANEOUS
  Administered 2019-01-30: 10:00:00 10 [IU] via SUBCUTANEOUS
  Administered 2019-01-30: 23:00:00 20 [IU] via SUBCUTANEOUS
  Filled 2019-01-29 (×5): qty 0.2

## 2019-01-29 MED ORDER — INSULIN ASPART 100 UNIT/ML ~~LOC~~ SOLN
6.0000 [IU] | SUBCUTANEOUS | Status: DC
  Administered 2019-01-29 – 2019-01-30 (×8): 6 [IU] via SUBCUTANEOUS

## 2019-01-29 MED ORDER — FREE WATER
200.0000 mL | Freq: Four times a day (QID) | Status: DC
  Administered 2019-01-29 – 2019-01-30 (×4): 200 mL

## 2019-01-29 MED ORDER — FLUCONAZOLE IN SODIUM CHLORIDE 400-0.9 MG/200ML-% IV SOLN
400.0000 mg | INTRAVENOUS | Status: DC
  Administered 2019-01-29 – 2019-01-30 (×2): 400 mg via INTRAVENOUS
  Filled 2019-01-29 (×3): qty 200

## 2019-01-29 NOTE — Progress Notes (Signed)
Husband called unit to talk with nurse about patient update. Nurse unavailable. I spoke with him and gave an additional update. Husband is happy with the communication that has been provided by staff. Reminded husband that we will call if there are any changes. Discussed with him our commitment to touch base once a shift to provided an update. Advised if additional support needed to ask for the charge nurse.

## 2019-01-29 NOTE — Progress Notes (Signed)
Husband and sister have both called today and talked with RN and patient.

## 2019-01-29 NOTE — Care Management (Signed)
Case manager continues to monitor for appropriate disposition when patient medically improves. May God Bless her to do so.   Ricki Miller, RN BSN Case Manager [phone]

## 2019-01-29 NOTE — Progress Notes (Signed)
NAME:  Maureen Price, MRN:  [id number], DOB:  Sep 02, 1976, LOS: 6 ADMISSION DATE:  01/22/2019, CONSULTATION DATE: May 19 REFERRING MD: Dr. Posey Pronto, CHIEF COMPLAINT: Shortness of breath  Brief History   43 year old female with poorly controlled diabetes initially admitted on May 17 for Fournier's gangrene requiring surgical debridement, found to be COVID positive.  Developed ARDS on May 20 requiring intubation, transferred to [facility] the same day.   Past Medical History  Diabetes mellitus type 2 Asthma Gastroesophageal reflux disease Depression Coronary artery disease Obesity Tobacco abuse  Significant Hospital Events   May 17 admitted for Fournier's gangrene May 18 surgical debridement May 20 worsening hypoxemia, ARDS, intubated, transferred to [place]:  General surgery PCCM  Procedures:  May 18 surgical debridement of gangrenous soft tissue  Significant Diagnostic Tests:  May 17 CT abdomen pelvis subcutaneous emphysema of soft tissue around the right aspect of anus, perineum, posterior right vulva, there is no discrete fluid collection appreciated however there was concern for gas-forming infection  Micro Data:  SARS COV 2 5/18: Positive 5/17: Blood cultures x2>>> negative 5/18: Fungal stain from abscess>>> positive >>  5/18: Fungal culture>>> positive >>  5/18: Abscess culture from deep wound >>  few Streptococcus anginosis and rare staph aureus, GNR >>   Antimicrobials:  Aztreonam May 17 > 5/21 Clindamycin May 17 > 5/21 Meropenem 5/21 >>  Vancomycin May 17 > 5/22 Remdesivir May 19 > 5/22  Interim history/subjective:  On FiO2 40%, PEEP 5 and tolerating some pressure support ventilation this morning Sedation has been significantly decreased, tolerating fentanyl 25 currently Note rising sodium Net +16 L Ferritin and CRP both decreasing  Objective   Blood pressure 140/72, pulse (!) 55, temperature 98.6 F (37 C),  temperature source Oral, resp. rate (!) 28, height 5' 5.5" (1.664 m), weight 108.6 kg, SpO2 100 %. CVP:  [7 mmHg-14 mmHg] 7 mmHg  Vent Mode: PSV;CPAP FiO2 (%):  [40 %] 40 % Set Rate:  [28 bmp] 28 bmp Vt Set:  [440 mL] 440 mL PEEP:  [5 cmH20] 5 cmH20 Pressure Support:  [18 cmH20] 18 cmH20 Plateau Pressure:  [21 cmH20-27 cmH20] 21 cmH20   Intake/Output Summary (Last 24 hours) at 01/29/2019 1146 Last data filed at 01/29/2019 1000 Gross per 24 hour  Intake 3114.43 ml  Output 2725 ml  Net 389.43 ml   Filed Weights   01/24/19 0600 01/27/19 0351 01/29/19 0400  Weight: 103.4 kg 117.4 kg 108.6 kg    Examination:  General: Obese woman, comfortable, ventilated HENT: ET tube, OG tube in good position, no oral lesions PULM: Distant, coarse breath sounds, no wheezing CV: Regular, no murmur GI obese, soft, nondistended with hypoactive bowel sounds GU: Perineal wound examined on 5/ 24 at the time of dressing change, packing was removed.  I did not see any areas of overt ischemia.  There was some whitish-tan secretions and some granulation tissue noted. MSK: No deformity Neuro: Wakes to voice much more easily.  Nodded to questions, follows commands    Resolved Hospital Problem list     Assessment & Plan:  ARDS due to COVID-19: Oxygenation improved this morning Has tolerated PRVC 6cc./kg.  Never required proning Now it looks like she has progressed to pressure support.  She likely could be extubated on her current 75 fentanyl.  My only concern is that she is requiring dressing changes twice daily and there is significant associated discomfort, requires fentanyl boluses at those times.  Unclear as  to whether she needs to have her airway protected in order to tolerate.  We will see how much pain control she requires today during her dressing change.  If it looks like she can tolerate without needing full sedation then I would consider an extubation. VAP prevention order sets Defer diuresis given  her rising sodium.  Note significant total body volume overload Corticosteroids as ordered, consider wean/DC soon Actemra deferred given her active infection Remdesivir day 6  Necrotizing fasciitis: Continue wet-to-dry dressings and packing per current plans I believe CCS needs to do a video examination on 5/25 to ensure no other recommendations. Fluconazole added 5/23 given wound culture data Continue meropenem.  Should cover strep anginosis, MSSA, GNR not yet speciated  Acute renal failure, nonoliguric.  Due to severe sepsis, recent active diuresis: Continue to defer diuretics - start when able, hypernatremia improves.  Ensure adequate renal perfusion Avoid nephrotoxins  Nutrition: TF as ordered    Best practice:  Diet: TF Pain/Anxiety/Delirium protocol (if indicated): yes, RASS goal -1 via PAD protocol VAP protocol (if indicated): yes DVT prophylaxis: lovenox GI prophylaxis: pantoprazole Glucose control: glucose control Mobility: bed rest Code Status: full Family Communication: per Dr Izora Gala today Disposition: remain in ICU  Labs   CBC: Recent Labs  Lab 01/22/19 2037  01/25/19 0545  01/26/19 0341  01/26/19 1600 01/27/19 0512 01/27/19 0535 01/28/19 0151 01/29/19 0256  WBC 23.4*   < > 23.7*  --  18.9*  --   --   --  20.7* 20.2* 12.2*  NEUTROABS 19.6*  --  19.4*  --   --   --   --   --   --   --   --   HGB 13.3   < > 10.9*   < > 8.6*   < > 8.3* 8.5* 8.3* 8.0* 7.9*  HCT 38.7   < > 31.4*   < > 25.3*   < > 23.8* 25.0* 24.3* 23.6* 23.4*  MCV 80.1   < > 79.7*  --  82.4  --   --   --  82.9 84.0 84.2  PLT 251   < > 257  --  241  --   --   --  271 283 284   < > = values in this interval not displayed.    Basic Metabolic Panel: Recent Labs  Lab 01/23/19 1436  01/25/19 0545  01/26/19 0341 01/26/19 1122 01/26/19 1600 01/27/19 0512 01/27/19 0535 01/27/19 1607 01/28/19 0150 01/28/19 0151 01/29/19 0256  NA 136  --  137   < > 141 141  --  142 142  --   --  144  148*  K 3.4*  --  3.2*   < > 3.2* 3.8  --  3.5 3.2*  --   --  3.9 4.0  CL 104  --  103  --  110  --   --   --  112*  --   --  114* 120*  CO2 18*  --  24  --  24  --   --   --  19*  --   --  22 22  GLUCOSE 259*  --  219*  --  77  --   --   --  230*  --   --  267* 285*  BUN 6  --  <5*  --  11  --   --   --  34*  --   --  51* 54*  CREATININE 0.59  --  0.49  --  1.12*  --   --   --  1.52*  --   --  1.60* 1.30*  CALCIUM 8.3*  --  8.0*  --  7.4*  --   --   --  6.8*  --   --  7.8* 7.9*  MG  --    < > 1.4*  --  1.5*  --  1.3*  --  2.0 1.1* 3.1*  --   --   PHOS 3.4  --  3.3  --   --   --  3.3  --  4.1 2.1*  --   --   --    < > = values in this interval not displayed.   GFR: Estimated Creatinine Clearance: 69.8 mL/min (A) (by C-G formula based on SCr of 1.3 mg/dL (H)). Recent Labs  Lab 01/22/19 2037 01/22/19 2222 01/23/19 0019  01/26/19 0341 01/27/19 0535 01/28/19 0151 01/29/19 0256  PROCALCITON  --   --  0.24  --   --  0.84 0.60  --   WBC 23.4*  --   --    < > 18.9* 20.7* 20.2* 12.2*  LATICACIDVEN 1.9 1.2  --   --   --   --   --   --    < > = values in this interval not displayed.    Liver Function Tests: Recent Labs  Lab 01/22/19 2037  01/25/19 0545 01/26/19 0341 01/27/19 0535 01/28/19 0151 01/29/19 0256  AST 18  --   --  36 24 16 39  ALT 23  --   --  35 35 30 40  ALKPHOS 134*  --   --  105 110 100 99  BILITOT 0.8  --   --  0.8 0.2* 0.3 <0.1*  PROT 6.9  --   --  5.7* 5.5* 5.9* 5.9*  ALBUMIN 2.7*   < > 1.8* 2.0* 1.7* 1.7* 1.7*   < > = values in this interval not displayed.   No results for input(s): LIPASE, AMYLASE in the last 168 hours. No results for input(s): AMMONIA in the last 168 hours.  ABG    Component Value Date/Time   PHART 7.341 (L) 01/27/2019 0512   PCO2ART 39.1 01/27/2019 0512   PO2ART 109.0 (H) 01/27/2019 0512   HCO3 21.2 01/27/2019 0512   TCO2 22 01/27/2019 0512   ACIDBASEDEF 4.0 (H) 01/27/2019 0512   O2SAT 98.0 01/27/2019 0512     Coagulation  Profile: Recent Labs  Lab 01/23/19 0019  INR 1.3*    Cardiac Enzymes: Recent Labs  Lab 01/23/19 0513  TROPONINI 0.03*    HbA1C: Hemoglobin A1C  Date/Time Value Ref Range Status  02/28/2018 08:45 AM 10.5 (A) 4.0 - 5.6 % Final  06/23/2017 10:16 AM 11.7  Final   Hgb A1c MFr Bld  Date/Time Value Ref Range Status  01/23/2019 02:36 PM 11.3 (H) 4.8 - 5.6 % Final    Comment:    (NOTE) Pre diabetes:          5.7%-6.4% Diabetes:              >6.4% Glycemic control for   <7.0% adults with diabetes   09/19/2016 03:46 AM 13.1 (H) 4.8 - 5.6 % Final    Comment:    (NOTE)         Pre-diabetes: 5.7 - 6.4         Diabetes: >6.4  Glycemic control for adults with diabetes: <7.0     CBG: Recent Labs  Lab 01/28/19 1635 01/28/19 2003 01/29/19 0025 01/29/19 0440 01/29/19 0752  GLUCAP 133* 157* 237* 230* 188*     Critical care time: 34 minutes     Baltazar Apo, MD, PhD 01/29/2019, 11:46 AM [facility] [phone] or if no answer [phone]

## 2019-01-29 NOTE — Progress Notes (Signed)
PROGRESS NOTE  Maureen Price LTJ:[id number] DOB: [birth year] DOA: 01/22/2019  PCP: [name], MD  Brief History/Interval Summary: - Pt. with PMH of Asthma, poorly controlled type II DM, GERD, depression, CAD S/P PCI, active smoker, obesity; admitted on 01/22/2019, presented with complaint of perineal pain, was found to have Fournier's gangrene.  S/P incision and debridement 01/23/2019.  Her SARS-CoV-2 was positive.  01/25/2019 patient comes more hypoxic unable to maintain adequate saturation on nonrebreather.    She had to be transferred to the ICU.  She was intubated and then subsequently transferred to the [facility].    Reason for Visit: Acute respiratory failure with hypoxia secondary to COVID-19  Subjective/Interval History: Patient remains intubated and sedated. no significant events overnight per staff  Assessment/Plan:  Acute Hypoxic Resp. Failure due to Acute Covid 19  Vent Mode: PRVC, currently on CPAP trial FiO2 (%):  [40 %-50 %] 40 % Set Rate:  [28 bmp] 28 bmp Vt Set:  [440 mL] 440 mL PEEP:  [5 cmH20] 5 cmH20 Pressure Support:  [12 cmH20-15 cmH20] 12 cmH20 Plateau Pressure:  [21 cmH20-30 cmH20] 29 cmH20     Component Value Date/Time   PHART 7.341 (L) 01/27/2019 0512   PCO2ART 39.1 01/27/2019 0512   PO2ART 109.0 (H) 01/27/2019 0512   HCO3 21.2 01/27/2019 0512   TCO2 22 01/27/2019 0512   ACIDBASEDEF 4.0 (H) 01/27/2019 0512   O2SAT 98.0 01/27/2019 0512    COVID-19 Labs  Recent Labs    01/26/19 0341 01/27/19 0535 01/27/19 0545 01/28/19 0151  DDIMER 2.57* 2.34*  --  1.78*  FERRITIN  --   --  293 237  CRP 34.7*  --  27.1* 20.6*    Lab Results  Component Value Date   SARSCOV2NAA POSITIVE (A) 01/23/2019     Fever: Afebrile the last 24 hours.  Last fever was 103.3 F on 5/20. Oxygen requirements: Remains on mechanical ventilation.  40% FiO2.  Saturating in the 90s.   Antibiotics: Empirically on vancomycin aztreonam.  Clindamycin was  discontinued.  Aztreonam was changed to meropenem.started on diflucan 5/23. Steroids: Initiated on Solu-Medrol on 5/21.  Diuretics: Lasix 60 mg IV x1 01/27/2019 Remdesivir: Initiated on 5/20 Actemra: Not given due to recent severe infection (Fournier's) Convalescent Plasma: Not yet Vitamin C and Zinc: On vitamin C   Patient remains on mechanical ventilation.  Noted to be on pressure support this morning.  Pulmonology is following and managing ventilator.  Good oxygenation.  Weaning trial today, require significant pain management during dressing change. -  Patient not given Actemra due to recent severe infection in the form of Fournier's gangrene.  She has been started on Solu-Medrol, and reamed to severe as well, continue to follow inflammatory markers, trending down, CRP is downat 11.8 today, - patient  is on higher dose Lovenox per COVID 19 ICU protocol DVT prophylaxis . -P chest x-ray this morning showing persistent severe multilobular pneumonia  Fournier's gangrene/sepsis present on admission - She underwent surgical debridement on 5/18.  Continue with the dressing changes wet  to dry  -Intraoperative culture was noted patient was initially on broad-spectrum coverage with vancomycin and aztreonam and clindamycin.  Intraoperative culture growing Streptococcus angiosis and MSSA, vancomycin has been stopped, she is currently on meropenem , as well she was started on Leukine for fungal in her wound cultures. -We will discuss with general surgery if they can assess her wound via telemedicine to get further recommendation. (Please see another progress note today with attached  picture of her wound)  Diabetes mellitus type 2 uncontrolled with peripheral vascular disease Apparently patient is noncompliant with insulin.  Has had a few episodes of hypoglycemia during this hospitalization.     -CBG remains elevated, will increase her levemir today, as well will increase her TIDAC novolg. -Poorly  controlled, HbA1c 11.3.    Hypernatremia - will start free water via OG tube, no lasix today.  Normocytic anemia Drop in hemoglobin is noted.  No evidence of overt bleeding.  Could be surgical loss versus acute illness versus hemodilution.   - Patient with known history of B12 deficiency.but B12 level significantly elevated at 6,256 , will D/C supplement.  Acute renal failure with hypokalemia and hypomagnesemia -creatinine peaked at 1.6,, hold nephrotoxic medications, to keep on the dry side given her COVID-19 , holding lasix due to hypernatremia -  Echocardiogram done in 2018 showed normal systolic function.  Coronary artery disease Stable.  She also has a history of peripheral vascular disease.  Apparently history of PCI as well.  Had MI in 2015.  Previously on aspirin and Plavix but the last few notes from PCP did not mention Plavix.  Continue only aspirin for now.     History of asthma Currently stable.  Morbid obesity BMI 37.36.  History of active smoking Will need counseling eventually     DVT Prophylaxis: On higher dose of Lovenox per COVID ICU protocol  Lab Results  Component Value Date   PLT 283 01/28/2019     PUD Prophylaxis: Protonix Code Status: Full code Family Communication: updated Husband yesterday, will call today Disposition Plan: Will remain in ICU     Consultants: Pulmonology.  General surgery has been following  Procedures: Incision and debridement of necrotizing soft tissue in the perineum and buttock area on 5/18  Antibiotics: Anti-infectives (From admission, onward)   Start     Dose/Rate Route Frequency Ordered Stop   01/29/19 1000  fluconazole (DIFLUCAN) IVPB 200 mg  Status:  Discontinued     200 mg 100 mL/hr over 60 Minutes Intravenous Every 24 hours 01/28/19 0923 01/29/19 0735   01/29/19 1000  fluconazole (DIFLUCAN) IVPB 400 mg     400 mg 100 mL/hr over 120 Minutes Intravenous Every 24 hours 01/29/19 0735     01/28/19 1000   fluconazole (DIFLUCAN) IVPB 800 mg     800 mg 100 mL/hr over 240 Minutes Intravenous  Once 01/28/19 0810 01/28/19 1400   01/28/19 0745  fluconazole (DIFLUCAN) IVPB 400 mg  Status:  Discontinued     400 mg 100 mL/hr over 120 Minutes Intravenous Every 24 hours 01/28/19 0740 01/28/19 0810   01/27/19 1430  remdesivir 100 mg in sodium chloride 0.9 % 230 mL IVPB     100 mg over 30 Minutes Intravenous Every 24 hours 01/27/19 1339 02/05/19 1429   01/26/19 2200  meropenem (MERREM) 1 g in sodium chloride 0.9 % 100 mL IVPB     1 g 200 mL/hr over 30 Minutes Intravenous Every 8 hours 01/26/19 1453     01/26/19 1415  remdesivir 100 mg in sodium chloride 0.9 % 250 mL IVPB  Status:  Discontinued     100 mg over 30 Minutes Intravenous Every 24 hours 01/25/19 1421 01/27/19 1340   01/26/19 1400  vancomycin (VANCOCIN) IVPB 750 mg/150 ml premix  Status:  Discontinued     750 mg 150 mL/hr over 60 Minutes Intravenous Every 12 hours 01/26/19 1326 01/27/19 1330   01/25/19 1430  remdesivir 200 mg in  sodium chloride 0.9 % 250 mL IVPB     200 mg over 30 Minutes Intravenous Once 01/25/19 1421 01/25/19 1949   01/24/19 2200  vancomycin (VANCOCIN) 1,250 mg in sodium chloride 0.9 % 250 mL IVPB  Status:  Discontinued     1,250 mg 166.7 mL/hr over 90 Minutes Intravenous Every 12 hours 01/24/19 1508 01/26/19 1046   01/23/19 1215  aztreonam (AZACTAM) 2 g in sodium chloride 0.9 % 100 mL IVPB  Status:  Discontinued     2 g 200 mL/hr over 30 Minutes Intravenous To Surgery 01/23/19 1207 01/23/19 1350   01/23/19 1200  vancomycin (VANCOCIN) IVPB 1000 mg/200 mL premix  Status:  Discontinued     1,000 mg 200 mL/hr over 60 Minutes Intravenous Every 12 hours 01/22/19 2359 01/24/19 1508   01/23/19 0800  clindamycin (CLEOCIN) IVPB 900 mg  Status:  Discontinued     900 mg 100 mL/hr over 30 Minutes Intravenous Every 8 hours 01/23/19 0035 01/26/19 1451   01/23/19 0000  clindamycin (CLEOCIN) IVPB 900 mg     900 mg 100 mL/hr over 30  Minutes Intravenous  Once 01/22/19 2350 01/23/19 0101   01/23/19 0000  aztreonam (AZACTAM) 2 g in sodium chloride 0.9 % 100 mL IVPB  Status:  Discontinued     2 g 200 mL/hr over 30 Minutes Intravenous Every 8 hours 01/22/19 2353 01/26/19 1453   01/23/19 0000  vancomycin (VANCOCIN) 1,500 mg in sodium chloride 0.9 % 500 mL IVPB     1,500 mg 250 mL/hr over 120 Minutes Intravenous  Once 01/22/19 2353 01/23/19 1930        Medications:  Scheduled: . aspirin  81 mg Per Tube Daily  . chlorhexidine gluconate (MEDLINE KIT)  15 mL Mouth Rinse BID  . Chlorhexidine Gluconate Cloth  6 each Topical Daily  . enoxaparin (LOVENOX) injection  0.5 mg/kg Subcutaneous Q12H  . feeding supplement (PRO-STAT SUGAR FREE 64)  60 mL Per Tube TID  . feeding supplement (VITAL HIGH PROTEIN)  1,000 mL Per Tube Q24H  . fentaNYL (SUBLIMAZE) injection  100 mcg Intravenous Once  . insulin aspart  0-20 Units Subcutaneous Q4H  . insulin aspart  6 Units Subcutaneous Q4H  . insulin detemir  20 Units Subcutaneous BID  . mouth rinse  15 mL Mouth Rinse 10 times per day  . methylPREDNISolone (SOLU-MEDROL) injection  40 mg Intravenous Q8H  . multivitamin with minerals  1 tablet Per Tube Daily  . pantoprazole sodium  40 mg Per Tube Daily  . polyethylene glycol  17 g Per Tube Daily  . rocuronium  80 mg Intravenous Once  . sodium chloride flush  10-40 mL Intracatheter Q12H  . vitamin C  1,000 mg Per Tube Daily   Continuous: . fentaNYL infusion INTRAVENOUS 25 mcg/hr (01/29/19 1000)  . fluconazole (DIFLUCAN) IV 100 mL/hr at 01/29/19 1000  . meropenem (MERREM) IV Stopped (01/29/19 0517)  . propofol (DIPRIVAN) infusion Stopped (01/28/19 0915)  . remdesivir 100 mg in NS 250 mL     ATF:TDDUKGURKYHCW (TYLENOL) oral liquid 160 mg/5 mL, albuterol, ALPRAZolam, fentaNYL, midazolam, sodium chloride flush   Objective:  Vital Signs  Vitals:   01/29/19 0802 01/29/19 0843 01/29/19 1000 01/29/19 1200  BP: (!) 152/86 (!) 145/78  140/72 (!) 155/83  Pulse: 66 76 (!) 55 (!) 55  Resp: (!) 28 17 (!) 28 (!) 28  Temp: 98.6 F (37 C)   98.5 F (36.9 C)  TempSrc: Oral   Oral  SpO2: 100% 100%  100% 100%  Weight:      Height:        Intake/Output Summary (Last 24 hours) at 01/29/2019 1215 Last data filed at 01/29/2019 1200 Gross per 24 hour  Intake 3078.38 ml  Output 2650 ml  Net 428.38 ml   Filed Weights   01/24/19 0600 01/27/19 0351 01/29/19 0400  Weight: 103.4 kg 117.4 kg 108.6 kg   Sedated, Intubated, open eyes, and follows command on moderate sedation Coarse vented respiratory sounds B/L RRR, no rubs or Gallops. Abd soft, nontender, nondistended. No Cyanosis, Clubbing or edema, No new Rash or bruise   (please see pictures of wound on different progress not)  Lab Results:  Data Reviewed: I have personally reviewed following labs and imaging studies  CBC: Recent Labs  Lab 01/22/19 2037  01/25/19 0545  01/26/19 0341  01/26/19 1600 01/27/19 0512 01/27/19 0535 01/28/19 0151 01/29/19 0256  WBC 23.4*   < > 23.7*  --  18.9*  --   --   --  20.7* 20.2* 12.2*  NEUTROABS 19.6*  --  19.4*  --   --   --   --   --   --   --   --   HGB 13.3   < > 10.9*   < > 8.6*   < > 8.3* 8.5* 8.3* 8.0* 7.9*  HCT 38.7   < > 31.4*   < > 25.3*   < > 23.8* 25.0* 24.3* 23.6* 23.4*  MCV 80.1   < > 79.7*  --  82.4  --   --   --  82.9 84.0 84.2  PLT 251   < > 257  --  241  --   --   --  271 283 284   < > = values in this interval not displayed.    Basic Metabolic Panel: Recent Labs  Lab 01/23/19 1436  01/25/19 0545  01/26/19 0341 01/26/19 1122 01/26/19 1600 01/27/19 0512 01/27/19 0535 01/27/19 1607 01/28/19 0150 01/28/19 0151 01/29/19 0256  NA 136  --  137   < > 141 141  --  142 142  --   --  144 148*  K 3.4*  --  3.2*   < > 3.2* 3.8  --  3.5 3.2*  --   --  3.9 4.0  CL 104  --  103  --  110  --   --   --  112*  --   --  114* 120*  CO2 18*  --  24  --  24  --   --   --  19*  --   --  22 22  GLUCOSE 259*  --  219*  --   77  --   --   --  230*  --   --  267* 285*  BUN 6  --  <5*  --  11  --   --   --  34*  --   --  51* 54*  CREATININE 0.59  --  0.49  --  1.12*  --   --   --  1.52*  --   --  1.60* 1.30*  CALCIUM 8.3*  --  8.0*  --  7.4*  --   --   --  6.8*  --   --  7.8* 7.9*  MG  --    < > 1.4*  --  1.5*  --  1.3*  --  2.0 1.1* 3.1*  --   --  PHOS 3.4  --  3.3  --   --   --  3.3  --  4.1 2.1*  --   --   --    < > = values in this interval not displayed.    GFR: Estimated Creatinine Clearance: 69.8 mL/min (A) (by C-G formula based on SCr of 1.3 mg/dL (H)).  Liver Function Tests: Recent Labs  Lab 01/22/19 2037  01/25/19 0545 01/26/19 0341 01/27/19 0535 01/28/19 0151 01/29/19 0256  AST 18  --   --  36 24 16 39  ALT 23  --   --  35 35 30 40  ALKPHOS 134*  --   --  105 110 100 99  BILITOT 0.8  --   --  0.8 0.2* 0.3 <0.1*  PROT 6.9  --   --  5.7* 5.5* 5.9* 5.9*  ALBUMIN 2.7*   < > 1.8* 2.0* 1.7* 1.7* 1.7*   < > = values in this interval not displayed.     Coagulation Profile: Recent Labs  Lab 01/23/19 0019  INR 1.3*    Cardiac Enzymes: Recent Labs  Lab 01/23/19 0513  TROPONINI 0.03*    HbA1C: No results for input(s): HGBA1C in the last 72 hours.  CBG: Recent Labs  Lab 01/28/19 2003 01/29/19 0025 01/29/19 0440 01/29/19 0752 01/29/19 1155  GLUCAP 157* 237* 230* 188* 198*    Lipid Profile: Recent Labs    01/28/19 0151 01/29/19 0256  TRIG 132 167*    Anemia Panel: Recent Labs    01/28/19 0151 01/29/19 0256 01/29/19 0257  VITAMINB12  --  6,256*  --   FOLATE  --   --  14.7  FERRITIN 237 227  --   TIBC  --  127*  --   IRON  --  33  --   RETICCTPCT  --  0.9  --     Recent Results (from the past 240 hour(s))  Culture, blood (routine x 2)     Status: None   Collection Time: 01/22/19  8:40 PM  Result Value Ref Range Status   Specimen Description BLOOD LEFT HAND  Final   Special Requests   Final    BOTTLES DRAWN AEROBIC AND ANAEROBIC Blood Culture adequate volume    Culture   Final    NO GROWTH 5 DAYS Performed at [facility], [place] [address] [place], [place] [address]    Report Status 01/27/2019 FINAL  Final  Culture, blood (routine x 2)     Status: None   Collection Time: 01/22/19  8:58 PM  Result Value Ref Range Status   Specimen Description BLOOD RIGHT ANTECUBITAL  Final   Special Requests   Final    BOTTLES DRAWN AEROBIC ONLY Blood Culture adequate volume   Culture   Final    NO GROWTH 5 DAYS Performed at [facility], [place] [address] [place], [place] [address]    Report Status 01/27/2019 FINAL  Final  SARS Coronavirus 2 (CEPHEID - Performed in [facility] lab), Hosp Order     Status: Abnormal   Collection Time: 01/23/19  1:19 AM  Result Value Ref Range Status   SARS Coronavirus 2 POSITIVE (A) NEGATIVE Final    Comment: RESULT CALLED TO, READ BACK BY AND VERIFIED WITH: R FITZGERAKD ANSTI @0235  01/23/19 BY S GEZAHEGN (NOTE) If result is NEGATIVE SARS-CoV-2 target nucleic acids are NOT DETECTED. The SARS-CoV-2 RNA is generally detectable in upper and lower  respiratory specimens during  the acute phase of infection. The lowest  concentration of SARS-CoV-2 viral copies this assay can detect is 250  copies / mL. A negative result does not preclude SARS-CoV-2 infection  and should not be used as the sole basis for treatment or other  patient management decisions.  A negative result may occur with  improper specimen collection / handling, submission of specimen other  than nasopharyngeal swab, presence of viral mutation(s) within the  areas targeted by this assay, and inadequate number of viral copies  (<250 copies / mL). A negative result must be combined with clinical  observations, patient history, and epidemiological information. If result is POSITIVE SARS-CoV-2 target nucleic acids are D ETECTED. The SARS-CoV-2 RNA is generally detectable in upper and lower  respiratory specimens during the acute phase of  infection.  Positive  results are indicative of active infection with SARS-CoV-2.  Clinical  correlation with patient history and other diagnostic information is  necessary to determine patient infection status.  Positive results do  not rule out bacterial infection or co-infection with other viruses. If result is PRESUMPTIVE POSTIVE SARS-CoV-2 nucleic acids MAY BE PRESENT.   A presumptive positive result was obtained on the submitted specimen  and confirmed on repeat testing.  While 2019 novel coronavirus  (SARS-CoV-2) nucleic acids may be present in the submitted sample  additional confirmatory testing may be necessary for epidemiological  and / or clinical management purposes  to differentiate between  SARS-CoV-2 and other Sarbecovirus currently known to infect humans.  If clinically indicated additional testing with an alternate test  methodology [phone]) is advised. The SARS-CoV-2 RNA is generally  detectable in upper and lower respiratory specimens during the acute  phase of infection. The expected result is Negative. Fact Sheet for Patients:  StrictlyIdeas.no Fact Sheet for Healthcare Providers: BankingDealers.co.za This test is not yet approved or cleared by the [place] FDA and has been authorized for detection and/or diagnosis of SARS-CoV-2 by FDA under an Emergency Use Authorization (EUA).  This EUA will remain in effect (meaning this test can be used) for the duration of the COVID-19 declaration under Section 564(b)(1) of the Act, 21 U.S.C. section 360bbb-3(b)(1), unless the authorization is terminated or revoked sooner. Performed at [facility], [place] [address], [place], [place] [address]   Aerobic/Anaerobic Culture (surgical/deep wound)     Status: None   Collection Time: 01/23/19 12:16 PM  Result Value Ref Range Status   Specimen Description ABSCESS PERINEUM  Final   Special Requests PATIENT ON FOLLOWING  CLEOCIN AND AZACTAM  Final   Gram Stain   Final    ABUNDANT WBC PRESENT,BOTH PMN AND MONONUCLEAR ABUNDANT GRAM NEGATIVE RODS MODERATE GRAM VARIABLE ROD FEW GRAM POSITIVE COCCI Performed at [facility], [place] [address], [place],  [address]    Culture   Final    FEW STREPTOCOCCUS ANGINOSIS MIXED ANAEROBIC FLORA PRESENT.  CALL LAB IF FURTHER IID REQUIRED.    Report Status 01/26/2019 FINAL  Final   Organism ID, Bacteria STREPTOCOCCUS ANGINOSIS  Final      Susceptibility   Streptococcus anginosis - MIC*    PENICILLIN 0.12 SENSITIVE Sensitive     CEFTRIAXONE 0.5 SENSITIVE Sensitive     ERYTHROMYCIN >=8 RESISTANT Resistant     LEVOFLOXACIN 1 SENSITIVE Sensitive     VANCOMYCIN 0.5 SENSITIVE Sensitive     * FEW STREPTOCOCCUS ANGINOSIS  Fungus Culture With Stain     Status: None   Collection Time: 01/23/19 12:41 PM  Result Value Ref Range Status   Fungus Stain Final report  Final   Fungus (Mycology) Culture Preliminary report  Final    Comment: RESULT CALLED TO, READ BACK BY AND VERIFIED WITH: RN Mellody Drown [phone] LP (NOTE) REPORTED POSITIVE RESULT TO LISA PITT AT 7:40 PM 01/27/2019 FAXED TO [phone] Performed At: [facility] [address] [place], [place] [id number] Rush Farmer MD XL:[id number]    Fungal Source TISSUE  Final    Comment: PERINEUM Performed at [facility], [place] [address], [place], [place] [address]   Aerobic/Anaerobic Culture (surgical/deep wound)     Status: None   Collection Time: 01/23/19 12:41 PM  Result Value Ref Range Status   Specimen Description TISSUE PERINEUM  Final   Special Requests PATIENT ON FOLLOWING CLEOCIN AND AZACTAM  Final   Gram Stain   Final    MODERATE WBC PRESENT,BOTH PMN AND MONONUCLEAR ABUNDANT GRAM NEGATIVE RODS FEW GRAM VARIABLE ROD FEW GRAM POSITIVE COCCI    Culture   Final    RARE STAPHYLOCOCCUS AUREUS RARE STREPTOCOCCUS ANGINOSIS ABUNDANT ANAEROBIC GRAM NEGATIVE ROD BETA LACTAMASE  POSITIVE Performed at [facility], [place] [address], [place], [place] [address]    Report Status 01/27/2019 FINAL  Final   Organism ID, Bacteria STREPTOCOCCUS ANGINOSIS  Final   Organism ID, Bacteria STAPHYLOCOCCUS AUREUS  Final      Susceptibility   Staphylococcus aureus - MIC*    CIPROFLOXACIN <=0.5 SENSITIVE Sensitive     ERYTHROMYCIN <=0.25 SENSITIVE Sensitive     GENTAMICIN <=0.5 SENSITIVE Sensitive     OXACILLIN <=0.25 SENSITIVE Sensitive     TETRACYCLINE <=1 SENSITIVE Sensitive     VANCOMYCIN <=0.5 SENSITIVE Sensitive     TRIMETH/SULFA <=10 SENSITIVE Sensitive     CLINDAMYCIN <=0.25 SENSITIVE Sensitive     RIFAMPIN <=0.5 SENSITIVE Sensitive     Inducible Clindamycin NEGATIVE Sensitive     * RARE STAPHYLOCOCCUS AUREUS   Streptococcus anginosis - MIC*    PENICILLIN <=0.06 SENSITIVE Sensitive     CEFTRIAXONE 0.5 SENSITIVE Sensitive     ERYTHROMYCIN >=8 RESISTANT Resistant     LEVOFLOXACIN 0.5 SENSITIVE Sensitive     VANCOMYCIN 0.5 SENSITIVE Sensitive     * RARE STREPTOCOCCUS ANGINOSIS  Fungus Culture Result     Status: None   Collection Time: 01/23/19 12:41 PM  Result Value Ref Range Status   Result 1 Comment  Final    Comment: (NOTE) KOH/Calcofluor preparation:  no fungus observed. Performed At: [facility] [place], [place] [id number] Rush Farmer MD IE:[id number]   Fungal organism reflex     Status: Abnormal   Collection Time: 01/23/19 12:41 PM  Result Value Ref Range Status   Fungal result 1 Comment (A)  Final    Comment: (NOTE) Yeast isolated, identification in progress. Performed At: [facility] [place], [place] [id number] Rush Farmer MD FU:[id number]       Radiology Studies: Dg Chest Port 1 View  Result Date: 01/29/2019 CLINICAL DATA:  43 year old female tested positive for COVID-19. EXAM: PORTABLE CHEST 1 VIEW COMPARISON:  Chest x-ray 01/28/2019. FINDINGS: An endotracheal tube is in place with tip  3.0 cm above the carina. There is a right upper extremity PICC with tip terminating in the superior cavoatrial junction. A nasogastric tube is seen extending into the stomach, however, the tip of the nasogastric tube extends below the lower margin of the image. Lung volumes are low. Severe patchy multifocal interstitial and airspace  disease throughout the lungs bilaterally, with no significant change in aeration compared to yesterday's examination. No pleural effusions. No evidence of pulmonary edema. Heart size is normal. Upper mediastinal contours are within normal limits. IMPRESSION: 1. Support apparatus, as above. 2. Severe multilobar pneumonia redemonstrated with no significant change in aeration compared to yesterday's examination. Electronically Signed   By: Vinnie Langton M.D.   On: 01/29/2019 07:23   Dg Chest Port 1 View  Result Date: 01/28/2019 CLINICAL DATA:  43 year old female with history of pneumonia from COVID-19. EXAM: PORTABLE CHEST 1 VIEW COMPARISON:  Chest x-ray 01/25/2019. FINDINGS: Low-lying endotracheal tube with tip approximately 9 mm above the carina. A nasogastric tube is seen extending into the stomach, however, the tip of the nasogastric tube extends below the lower margin of the image. Lung volumes are low. Patchy multifocal interstitial and airspace disease noted throughout the lungs bilaterally. No pleural effusions. No cephalization of the pulmonary vasculature. Heart size is borderline enlarged. Upper mediastinal contours are within normal limits. IMPRESSION: 1. Support apparatus, as above. Please take note of the low position of the endotracheal tube and consider withdrawal approximately 4 cm for more optimal placement. 2. Severe multilobar pneumonia redemonstrated, as above. Electronically Signed   By: Vinnie Langton M.D.   On: 01/28/2019 07:35       LOS: 6 days   Phillips Climes MD   [facility] Pager on [email or web address]  01/29/2019, 12:15 PM

## 2019-01-29 NOTE — Progress Notes (Signed)
Pharmacy Antibiotic Note  Maureen Price is a 43 y.o. female admitted on 01/22/2019 with Fournier's gangrene and COVID pneumonia..  Pharmacy is renally dosing Meropenem and Fluconazole.  SCr improved to 1.3, CrCl ~ 70 ml/min WBC 12.2 (solumedrol) Tm 99.9  Plan: Meropenem 1g IV q8h Fluconazole 400 mg IV q24h.   Height: 5' 5.5" (166.4 cm) Weight: 239 lb 6.7 oz (108.6 kg) IBW/kg (Calculated) : 58.15  Temp (24hrs), Avg:99.1 F (37.3 C), Min:98.6 F (37 C), Max:99.9 F (37.7 C)  Recent Labs  Lab 01/22/19 2037 01/22/19 2222  01/25/19 0545 01/26/19 0341 01/27/19 0535 01/28/19 0151 01/29/19 0256  WBC 23.4*  --    < > 23.7* 18.9* 20.7* 20.2* 12.2*  CREATININE 0.70  --    < > 0.49 1.12* 1.52* 1.60* 1.30*  LATICACIDVEN 1.9 1.2  --   --   --   --   --   --    < > = values in this interval not displayed.    Estimated Creatinine Clearance: 69.8 mL/min (A) (by C-G formula based on SCr of 1.3 mg/dL (H)).    Allergies  Allergen Reactions  . Erythromycin Anaphylaxis  . Penicillins Anaphylaxis  . Metformin And Related Other (See Comments)    Diarrhea, upset stomach  . Viibryd [Vilazodone Hcl] Other (See Comments)    Altered mental status.    . Bee Venom Swelling    Throat swells    Antimicrobials this admission: Vanc 5/17 >> Aztreonam 5/17 >>5/21 Clindamycin 5/18 >>5/21 Remdesivir 5/20 >> Meropenem 5/21 >>  Fluconazole 5/23 >>   Microbiology results: 5/17 BCx: NGTD 5/18 COVID: positive 5/18 perineum abscess - strep anginosis (sens: PCN, CTX, Levo, Vanc), mixed aerobic flora (GNR, GVR, GPC) 5/18 perineum abscess tissue - strep anginosis (sens: PCN, CTX, Levo, Vanc), rare MSSA, abundant anaerobic GNR (BL+). 5/18 Abscess fungal cx - Yeast isolated, identification in progress   Thank you for allowing pharmacy to be a part of this patient's care.  Gretta Arab PharmD, BCPS 01/29/2019 7:36 AM

## 2019-01-29 NOTE — Progress Notes (Signed)
Paged Dr. Bonner Puna regarding sustained bradycardia. Pt HR ranging 47-52. Currently on 50 mcg of Fentanyl. HR will increase when aroused but drop back down into high 40's.

## 2019-01-29 NOTE — Progress Notes (Signed)
Pt husband called for updates. He asked that I tell patient he loved her and thanked [place] again for her care.

## 2019-01-29 NOTE — Progress Notes (Signed)
Sister called for updates. I lcommunicated that she is not listed on the contact information and due to HIPPA I was unable to provide any information. She was understanding. She will contact patient's daughter for an update.

## 2019-01-29 NOTE — Progress Notes (Signed)
Patient was able to facetime with daughter and hear husbands voice on the phone.

## 2019-01-29 NOTE — Progress Notes (Signed)
Wasted one full infusion of propofol into sharps container with witness of Patsy Lager RN

## 2019-01-30 DIAGNOSIS — J45909 Unspecified asthma, uncomplicated: Secondary | ICD-10-CM

## 2019-01-30 LAB — COMPREHENSIVE METABOLIC PANEL
ALT: 40 U/L (ref 0–44)
AST: 20 U/L (ref 15–41)
Albumin: 1.9 g/dL — ABNORMAL LOW (ref 3.5–5.0)
Alkaline Phosphatase: 91 U/L (ref 38–126)
Anion gap: 6 (ref 5–15)
BUN: 63 mg/dL — ABNORMAL HIGH (ref 6–20)
CO2: 22 mmol/L (ref 22–32)
Calcium: 8.5 mg/dL — ABNORMAL LOW (ref 8.9–10.3)
Chloride: 123 mmol/L — ABNORMAL HIGH (ref 98–111)
Creatinine, Ser: 1.2 mg/dL — ABNORMAL HIGH (ref 0.44–1.00)
GFR calc Af Amer: >60 mL/min (ref >60)
GFR calc non Af Amer: 56 mL/min — ABNORMAL LOW (ref >60)
Glucose, Bld: 231 mg/dL — ABNORMAL HIGH (ref 70–99)
Potassium: 3.8 mmol/L (ref 3.5–5.1)
Sodium: 151 mmol/L — ABNORMAL HIGH (ref 135–145)
Total Bilirubin: 0.1 mg/dL — ABNORMAL LOW (ref 0.3–1.2)
Total Protein: 6.2 g/dL — ABNORMAL LOW (ref 6.5–8.1)

## 2019-01-30 LAB — CBC
HCT: 27.2 % — ABNORMAL LOW (ref 36.0–46.0)
Hemoglobin: 8.9 g/dL — ABNORMAL LOW (ref 12.0–15.0)
MCH: 27.7 pg (ref 26.0–34.0)
MCHC: 32.7 g/dL (ref 30.0–36.0)
MCV: 84.7 fL (ref 80.0–100.0)
Platelets: 339 10*3/uL (ref 150–400)
RBC: 3.21 MIL/uL — ABNORMAL LOW (ref 3.87–5.11)
RDW: 14.7 % (ref 11.5–15.5)
WBC: 14.8 10*3/uL — ABNORMAL HIGH (ref 4.0–10.5)
nRBC: 0 % (ref 0.0–0.2)

## 2019-01-30 LAB — TRIGLYCERIDES: Triglycerides: 158 mg/dL — ABNORMAL HIGH (ref <150)

## 2019-01-30 LAB — GLUCOSE, CAPILLARY
Glucose-Capillary: 150 mg/dL — ABNORMAL HIGH (ref 70–99)
Glucose-Capillary: 131 mg/dL — ABNORMAL HIGH (ref 70–99)
Glucose-Capillary: 106 mg/dL — ABNORMAL HIGH (ref 70–99)
Glucose-Capillary: 81 mg/dL (ref 70–99)
Glucose-Capillary: 141 mg/dL — ABNORMAL HIGH (ref 70–99)

## 2019-01-30 LAB — D-DIMER, QUANTITATIVE: D-Dimer, Quant: 2.17 ug/mL-FEU — ABNORMAL HIGH (ref 0.00–0.50)

## 2019-01-30 LAB — FERRITIN: Ferritin: 190 ng/mL (ref 11–307)

## 2019-01-30 LAB — C-REACTIVE PROTEIN: CRP: 7.3 mg/dL — ABNORMAL HIGH (ref <1.0)

## 2019-01-30 MED ORDER — FREE WATER: 300.0000 mL | Status: DC

## 2019-01-30 MED ORDER — HYDROMORPHONE HCL 1 MG/ML IJ SOLN
1.0000 mg | INTRAMUSCULAR | Status: DC | PRN
  Administered 2019-01-30: 1 mg via INTRAVENOUS
  Administered 2019-01-30: 0.5 mg via INTRAVENOUS
  Administered 2019-01-31: 1 mg via INTRAVENOUS
  Administered 2019-01-31: 2 mg via INTRAVENOUS
  Administered 2019-01-31: 1 mg via INTRAVENOUS
  Administered 2019-02-01: 2 mg via INTRAVENOUS
  Filled 2019-01-30: qty 1
  Filled 2019-01-30: qty 2
  Filled 2019-01-30 (×3): qty 1
  Filled 2019-01-30: qty 2

## 2019-01-30 MED ORDER — METHYLPREDNISOLONE SODIUM SUCC 40 MG IJ SOLR: 40.0000 mg | Freq: Two times a day (BID) | INTRAMUSCULAR | Status: DC

## 2019-01-30 MED ORDER — INSULIN ASPART 100 UNIT/ML ~~LOC~~ SOLN
0.0000 [IU] | Freq: Three times a day (TID) | SUBCUTANEOUS | Status: DC
  Administered 2019-01-31 – 2019-02-04 (×6): 3 [IU] via SUBCUTANEOUS
  Administered 2019-02-04: 4 [IU] via SUBCUTANEOUS

## 2019-01-30 MED ORDER — ASPIRIN 81 MG PO CHEW
81.0000 mg | CHEWABLE_TABLET | Freq: Every day | ORAL | Status: DC
  Administered 2019-01-30 – 2019-02-06 (×8): 81 mg via ORAL
  Filled 2019-01-30 (×8): qty 1

## 2019-01-30 MED ORDER — RESOURCE THICKENUP CLEAR PO POWD
ORAL | Status: DC | PRN
  Filled 2019-01-30: qty 125

## 2019-01-30 MED ORDER — INSULIN ASPART 100 UNIT/ML ~~LOC~~ SOLN: 6.0000 [IU] | Freq: Three times a day (TID) | SUBCUTANEOUS | Status: DC

## 2019-01-30 MED ORDER — AMLODIPINE BESYLATE 5 MG PO TABS
5.0000 mg | ORAL_TABLET | Freq: Every day | ORAL | Status: DC
  Administered 2019-01-31: 10:00:00 5 mg via ORAL
  Filled 2019-01-30: qty 1

## 2019-01-30 MED ORDER — PANTOPRAZOLE SODIUM 40 MG PO TBEC
40.0000 mg | DELAYED_RELEASE_TABLET | Freq: Every day | ORAL | Status: DC
  Administered 2019-01-30 – 2019-02-06 (×8): 40 mg via ORAL
  Filled 2019-01-30 (×9): qty 1

## 2019-01-30 MED ORDER — HYDRALAZINE HCL 20 MG/ML IJ SOLN
5.0000 mg | INTRAMUSCULAR | Status: DC | PRN
  Administered 2019-01-30: 5 mg via INTRAVENOUS
  Filled 2019-01-30: qty 1

## 2019-01-30 MED ORDER — VITAMIN C 500 MG PO TABS
1000.0000 mg | ORAL_TABLET | Freq: Every day | ORAL | Status: DC
  Administered 2019-01-31 – 2019-02-06 (×7): 1000 mg via ORAL
  Filled 2019-01-30 (×7): qty 2

## 2019-01-30 MED ORDER — AMLODIPINE BESYLATE 5 MG PO TABS
5.0000 mg | ORAL_TABLET | Freq: Every day | ORAL | Status: DC
  Administered 2019-01-30: 5 mg
  Filled 2019-01-30: qty 1

## 2019-01-30 MED ORDER — METHYLPREDNISOLONE SODIUM SUCC 40 MG IJ SOLR
40.0000 mg | Freq: Every day | INTRAMUSCULAR | Status: AC
  Administered 2019-01-31: 10:00:00 40 mg via INTRAVENOUS
  Filled 2019-01-30: qty 1

## 2019-01-30 MED ORDER — ADULT MULTIVITAMIN W/MINERALS CH
1.0000 | ORAL_TABLET | Freq: Every day | ORAL | Status: DC
  Administered 2019-01-31 – 2019-02-06 (×7): 1 via ORAL
  Filled 2019-01-30 (×7): qty 1

## 2019-01-30 NOTE — Procedures (Signed)
Extubation Procedure Note  Patient Details:   Name: Maureen Price DOB: [birth year] MRN: [id number]   Airway Documentation:    Vent end date: 01/30/19 Vent end time: 0920   Evaluation  O2 sats: stable throughout Complications: No apparent complications Patient did tolerate procedure well. Bilateral Breath Sounds: Rhonchi, Diminished   Yes   Positive cuff leak noted. Patient placed on NRB 15 L, sat 100%.  Patient has a strong productive cough.  Bayard Beaver 01/30/2019, 9:34 AM

## 2019-01-30 NOTE — Progress Notes (Signed)
[facility] PCCM  Full note to follow Passing SBT Extubate now  Roselie Awkward, MD [place] P[facility]M Pager: [phone] Cell: [phone] If no response, call [phone]

## 2019-01-30 NOTE — Progress Notes (Signed)
NAME:  Maureen Price, MRN:  [id number], DOB:  1976-03-14, LOS: 7 ADMISSION DATE:  01/22/2019, CONSULTATION DATE: May 19 REFERRING MD: Dr. Posey Pronto, CHIEF COMPLAINT: Dyspnea  Brief History   43 year old female with poorly controlled diabetes initially mated on May 17 for Fournier's gangrene requiring surgical debridement, found to have COVID.  Developed ARDS on May 20 requiring intubation, transferred to [facility] same day.   Past Medical History  Diabetes mellitus type 2 Asthma Gastroesophageal reflux disease Depression Coronary artery disease Obesity Tobacco abuse  Significant Hospital Events   May 17 admitted for Fournier's gangrene May 18 surgical debridement May 20 worsening hypoxemia, ARDS, intubated, transferred to [place]:  General surgery PCCM  Procedures:  May 18 surgical debridement of gangrenous soft tissue May 20 ETT > 5/25  Significant Diagnostic Tests:  May 17 CT abdomen pelvis subcutaneous emphysema of soft tissue around the right aspect of anus, perineum, posterior right vulva, there is no discrete fluid collection appreciated however there was concern for gas-forming infection  Micro Data:  SARS COV 2 5/18: Positive 5/17: Blood cultures x2>>> negative 5/18: Fungal stain from abscess>>> positive >>  5/18: Fungal culture>>> positive >>  5/18: Abscess culture from deep wound >>  few Streptococcus anginosisand rare staph aureus, GNR >>   Antimicrobials:  Aztreonam May 17 > 5/21 Clindamycin May 17 > 5/21 Meropenem 5/21 >>  Vancomycin May 17 > 5/22 Remdesivir May 19 >   Interim history/subjective:  Tolerating spontaneous breathing trial  Objective   Blood pressure (!) 163/79, pulse (!) 56, temperature 98.5 F (36.9 C), temperature source Oral, resp. rate (!) 25, height 5' 5.5" (1.664 m), weight 115.5 kg, SpO2 97 %. CVP:  [7 mmHg-14 mmHg] 9 mmHg  Vent Mode: PRVC FiO2 (%):  [40 %] 40 % Set Rate:  [28 bmp]  28 bmp Vt Set:  [440 mL] 440 mL PEEP:  [5 cmH20] 5 cmH20 Pressure Support:  [14 cmH20-18 cmH20] 14 cmH20 Plateau Pressure:  [21 cmH20-26 cmH20] 21 cmH20   Intake/Output Summary (Last 24 hours) at 01/30/2019 0745 Last data filed at 01/30/2019 0600 Gross per 24 hour  Intake 1847.54 ml  Output 3350 ml  Net -1502.46 ml   Filed Weights   01/27/19 0351 01/29/19 0400 01/30/19 0500  Weight: 117.4 kg 108.6 kg 115.5 kg    Examination:  General:  In bed on vent HENT: NCAT ETT in place PULM: CTA B, vent supported breathing CV: RRR, no mgr GI: BS+, soft, nontender MSK: normal bulk and tone Neuro: awake on vent, follows commands   Resolved Hospital Problem list     Assessment & Plan:  ARDS COVID 19: Improved oxygenation Extubate today Aspiration precautions Advance diet slowly if tolerates extubation Continue to administer O2 to maintain O2 saturation greater than 88% Continue remdesivir  Nec Fasciitis: Stable Dressing change today with surgery monitoring with video conference  AKI improved Monitor BMET and UOP Replace electrolytes as needed  Nutrition Advance diet  Best practice:  Diet: advance diet Pain/Anxiety/Delirium protocol (if indicated): d/c VAP protocol (if indicated): d/c DVT prophylaxis: lovenox GI prophylaxis: famotidine Glucose control: SSI Mobility: PT consult, out of bed Code Status: full Family Communication: per [facility] Disposition: remain in ICU today  Labs   CBC: Recent Labs  Lab 01/25/19 0545  01/26/19 0341  01/27/19 0512 01/27/19 0535 01/28/19 0151 01/29/19 0256 01/30/19 0115  WBC 23.7*  --  18.9*  --   --  20.7* 20.2* 12.2* 14.8*  NEUTROABS 19.4*  --   --   --   --   --   --   --   --  HGB 10.9*   < > 8.6*   < > 8.5* 8.3* 8.0* 7.9* 8.9*  HCT 31.4*   < > 25.3*   < > 25.0* 24.3* 23.6* 23.4* 27.2*  MCV 79.7*  --  82.4  --   --  82.9 84.0 84.2 84.7  PLT 257  --  241  --   --  271 283 284 339   < > = values in this interval not displayed.     Basic Metabolic Panel: Recent Labs  Lab 01/23/19 1436  01/25/19 0545  01/26/19 0341  01/26/19 1600 01/27/19 0512 01/27/19 0535 01/27/19 1607 01/28/19 0150 01/28/19 0151 01/29/19 0256 01/30/19 0115  NA 136  --  137   < > 141   < >  --  142 142  --   --  144 148* 151*  K 3.4*  --  3.2*   < > 3.2*   < >  --  3.5 3.2*  --   --  3.9 4.0 3.8  CL 104  --  103  --  110  --   --   --  112*  --   --  114* 120* 123*  CO2 18*  --  24  --  24  --   --   --  19*  --   --  22 22 22   GLUCOSE 259*  --  219*  --  77  --   --   --  230*  --   --  267* 285* 231*  BUN 6  --  <5*  --  11  --   --   --  34*  --   --  51* 54* 63*  CREATININE 0.59  --  0.49  --  1.12*  --   --   --  1.52*  --   --  1.60* 1.30* 1.20*  CALCIUM 8.3*  --  8.0*  --  7.4*  --   --   --  6.8*  --   --  7.8* 7.9* 8.5*  MG  --    < > 1.4*  --  1.5*  --  1.3*  --  2.0 1.1* 3.1*  --   --   --   PHOS 3.4  --  3.3  --   --   --  3.3  --  4.1 2.1*  --   --   --   --    < > = values in this interval not displayed.   GFR: Estimated Creatinine Clearance: 78.2 mL/min (A) (by C-G formula based on SCr of 1.2 mg/dL (H)). Recent Labs  Lab 01/27/19 0535 01/28/19 0151 01/29/19 0256 01/30/19 0115  PROCALCITON 0.84 0.60  --   --   WBC 20.7* 20.2* 12.2* 14.8*    Liver Function Tests: Recent Labs  Lab 01/26/19 0341 01/27/19 0535 01/28/19 0151 01/29/19 0256 01/30/19 0115  AST 36 24 16 39 20  ALT 35 35 30 40 40  ALKPHOS 105 110 100 99 91  BILITOT 0.8 0.2* 0.3 <0.1* 0.1*  PROT 5.7* 5.5* 5.9* 5.9* 6.2*  ALBUMIN 2.0* 1.7* 1.7* 1.7* 1.9*   No results for input(s): LIPASE, AMYLASE in the last 168 hours. No results for input(s): AMMONIA in the last 168 hours.  ABG    Component Value Date/Time   PHART 7.341 (L) 01/27/2019 0512   PCO2ART 39.1 01/27/2019 0512   PO2ART 109.0 (H) 01/27/2019 0512   HCO3 21.2 01/27/2019 6962  TCO2 22 01/27/2019 0512   ACIDBASEDEF 4.0 (H) 01/27/2019 0512   O2SAT 98.0 01/27/2019 0512      Coagulation Profile: No results for input(s): INR, PROTIME in the last 168 hours.  Cardiac Enzymes: No results for input(s): CKTOTAL, CKMB, CKMBINDEX, TROPONINI in the last 168 hours.  HbA1C: Hemoglobin A1C  Date/Time Value Ref Range Status  02/28/2018 08:45 AM 10.5 (A) 4.0 - 5.6 % Final  06/23/2017 10:16 AM 11.7  Final   Hgb A1c MFr Bld  Date/Time Value Ref Range Status  01/23/2019 02:36 PM 11.3 (H) 4.8 - 5.6 % Final    Comment:    (NOTE) Pre diabetes:          5.7%-6.4% Diabetes:              >6.4% Glycemic control for   <7.0% adults with diabetes   09/19/2016 03:46 AM 13.1 (H) 4.8 - 5.6 % Final    Comment:    (NOTE)         Pre-diabetes: 5.7 - 6.4         Diabetes: >6.4         Glycemic control for adults with diabetes: <7.0     CBG: Recent Labs  Lab 01/29/19 0752 01/29/19 1155 01/29/19 1559 01/29/19 1924 01/29/19 2325  GLUCAP 188* 198* 157* 186* 222*      Critical care time: 33 minutes    Roselie Awkward, MD [place] PCCM Pager: [phone] Cell: [phone] If no response, call [phone]

## 2019-01-30 NOTE — Progress Notes (Signed)
  PT Cancellation Note  Patient Details Name: Maureen Price MRN: [id number] DOB: 09/26/1975   Cancelled Treatment:    Reason Eval/Treat Not Completed: Medical issues which prohibited therapy, Patient to have dressing changed and medicated. Per RN, patient sat at bed edge. Continue PT eval tomorrow.   Tresa Endo [place] Pager [phone] Office [phone]  01/30/2019, 5:02 PM

## 2019-01-30 NOTE — Evaluation (Signed)
Clinical/Bedside Swallow Evaluation Patient Details  Name: Maureen Price MRN: [id number] Date of Birth: 1976/08/15  Today's Date: 01/30/2019 Time: SLP Start Time (ACUTE ONLY): 1430 SLP Stop Time (ACUTE ONLY): 1500 SLP Time Calculation (min) (ACUTE ONLY): 30 min  Past Medical History:  Past Medical History:  Diagnosis Date  . Acute myocardial infarction of other anterior wall, initial episode of care   . Anginal pain ([place])   . Anxiety   . Arthritis   . Asthma   . CAD (coronary artery disease)   . CHF (congestive heart failure) ([place])   . Depression   . Diabetes mellitus    2002; no meds since 2004  . Dyslipidemia, goal LDL below 70 10/26/2013  . Dysrhythmia   . GERD (gastroesophageal reflux disease)   . Headache(784.0)   . Obesity   . Paroxysmal ventricular tachycardia ([place])   . Shortness of breath   . STEMI (ST elevation myocardial infarction) ([place])   . Tobacco abuse    Past Surgical History:  Past Surgical History:  Procedure Laterality Date  . CESAREAN SECTION    . CESAREAN SECTION  1994,1997,2000  . CHOLECYSTECTOMY    . CORONARY ANGIOPLASTY WITH STENT PLACEMENT    . GALLBLADDER SURGERY    . INCISION AND DRAINAGE PERIRECTAL ABSCESS N/A 01/23/2019   Procedure: IRRIGATION AND DEBRIDEMENT PERIRECTAL ABSCESS;  Surgeon: Rolm Bookbinder, MD;  Location: [facility];  Service: General;  Laterality: N/A;  . LEFT HEART CATH N/A 10/24/2013   Procedure: LEFT HEART CATH;  Surgeon: Lorretta Harp, MD;  Location: [facility] CATH LAB;  Service: Cardiovascular;  Laterality: N/A;  . LEFT HEART CATHETERIZATION WITH CORONARY ANGIOGRAM N/A 10/26/2013   Procedure: LEFT HEART CATHETERIZATION WITH CORONARY ANGIOGRAM;  Surgeon: Burnell Blanks, MD;  Location: [facility] CATH LAB;  Service: Cardiovascular;  Laterality: N/A;  . LEFT HEART CATHETERIZATION WITH CORONARY ANGIOGRAM N/A 03/29/2014   Procedure: LEFT HEART CATHETERIZATION WITH CORONARY ANGIOGRAM;  Surgeon: Lorretta Harp, MD;  Location: [facility] CATH  LAB;  Service: Cardiovascular;  Laterality: N/A;  . TUBAL LIGATION     2000   HPI:  43 y.o. female who worked at [employer] with [place] of asthma, poorly controlled type II DM, GERD, depression, CAD S/P PCI, active smoker, obesity; admitted on 01/22/2019, presented with complaint of perineal pain, was found to have Fournier's gangrene.  S/P incision and debridement 01/23/2019.  Her SARS-CoV-2 was positive.  01/25/2019 patient became more hypoxic, unable to maintain adequate saturation on nonrebreather.  She was intubated and then subsequently transferred to the [facility].  ETT 5/20-25.    Assessment / Plan / Recommendation Clinical Impression  Pt presents with an acute reversible dysphagia s/p five-day intubation marked by failure of three oz water test.  Nectar-thick liquids, purees, and solids are consumed without deficit.  Pt's voice is dysphonic/hypophonic.  There is likely glottal insufficiency creating opportunity for aspiration of thin liquids.  Anticipate rapid improvement given pt's youth and five day intubation period.  For today, thicken liquids to nectar; allow mechanical soft solids as ordered.  Pt has been a CNA for 19 years and is familiar with dysphagia diets - we discussed likely brief need to thicken liquids; she agrees and is recalling precautions.  SLP will follow. SLP Visit Diagnosis: Dysphagia, unspecified (R13.10)    Aspiration Risk  Mild aspiration risk    Diet Recommendation   mechanical soft, thin liquids; allow ice chips  Medication Administration: Whole meds with liquid    Other  Recommendations  Oral Care Recommendations: Oral care BID Other Recommendations: Order thickener from pharmacy   Follow up Recommendations (tba)      Frequency and Duration min 3x week  1 week       Prognosis Prognosis for Safe Diet Advancement: Good      Swallow Study   General Date of Onset: 01/22/19 HPI: 43 y.o. female with PMH of asthma, poorly controlled type II DM,  GERD, depression, CAD S/P PCI, active smoker, obesity; admitted on 01/22/2019, presented with complaint of perineal pain, was found to have Fournier's gangrene.  S/P incision and debridement 01/23/2019.  Her SARS-CoV-2 was positive.  01/25/2019 patient became more hypoxic, unable to maintain adequate saturation on nonrebreather.  She was intubated and then subsequently transferred to the [facility].  ETT 5/20-25.  Type of Study: Bedside Swallow Evaluation Diet Prior to this Study: Dysphagia 3 (soft);Thin liquids Temperature Spikes Noted: No Respiratory Status: Nasal cannula(2 l) History of Recent Intubation: Yes Length of Intubations (days): 5 days Date extubated: 01/30/19 Behavior/Cognition: Alert;Cooperative;Pleasant mood Oral Cavity Assessment: Within Functional Limits Oral Care Completed by SLP: Recent completion by staff Oral Cavity - Dentition: Adequate natural dentition Vision: Functional for self-feeding Self-Feeding Abilities: Able to feed self Patient Positioning: Upright in bed Baseline Vocal Quality: Hoarse;Low vocal intensity Volitional Cough: Weak Volitional Swallow: Able to elicit    Oral/Motor/Sensory Function Overall Oral Motor/Sensory Function: Within functional limits   Ice Chips Ice chips: Within functional limits   Thin Liquid Thin Liquid: Impaired Presentation: Cup;Self Fed Pharyngeal  Phase Impairments: Cough - Immediate    Nectar Thick Nectar Thick Liquid: Within functional limits Presentation: Cup;Self Fed;Straw   Honey Thick Honey Thick Liquid: Not tested   Puree Puree: Within functional limits   Solid     Solid: Within functional limits      Juan Quam Laurice 01/30/2019,4:45 PM   Estill Bamberg L. Tivis Ringer, [place] Office number [phone]

## 2019-01-30 NOTE — Progress Notes (Signed)
Wasted 150cc Fentanyl from infusion bag into sink with witness of second RN, Ulice Dash.

## 2019-01-30 NOTE — Progress Notes (Addendum)
OT Cancellation Note  Patient Details Name: Maureen Price MRN: [id number] DOB: 13-Jan-1976   Cancelled Treatment:    Reason Eval/Treat Not Completed: Medical issues which prohibited therapy;Patient at procedure or test/ unavailable;Other (comment)(RN changing dressing and request OT eval later. Will return as schedule allows. Thank you. )  [place] MSOT, OTR/L Acute Rehab Pager: [phone] Office: [phone] 01/30/2019, 4:55 PM

## 2019-01-30 NOTE — Progress Notes (Signed)
PROGRESS NOTE  AMBIKA Price PYK:[id number] DOB: [birth year] DOA: 01/22/2019  PCP: [name], MD  Brief History/Interval Summary: - Pt. with PMH of Asthma, poorly controlled type II DM, GERD, depression, CAD S/P PCI, active smoker, obesity; admitted on 01/22/2019, presented with complaint of perineal pain, was found to have Fournier's gangrene.  S/P incision and debridement 01/23/2019.  Her SARS-CoV-2 was positive.  01/25/2019 patient comes more hypoxic unable to maintain adequate saturation on nonrebreather.    She had to be transferred to the ICU.  She was intubated and then subsequently transferred to the [facility].    Reason for Visit: Acute respiratory failure with hypoxia secondary to COVID-19  Subjective/Interval History: No significant events overnight per staff, debated earlier this morning  Assessment/Plan:  Acute Hypoxic Resp. Failure due to Acute Covid 19     Component Value Date/Time   PHART 7.341 (L) 01/27/2019 0512   PCO2ART 39.1 01/27/2019 0512   PO2ART 109.0 (H) 01/27/2019 0512   HCO3 21.2 01/27/2019 0512   TCO2 22 01/27/2019 0512   ACIDBASEDEF 4.0 (H) 01/27/2019 0512   O2SAT 98.0 01/27/2019 0512    COVID-19 Labs  Recent Labs    01/26/19 0341 01/27/19 0535 01/27/19 0545 01/28/19 0151  DDIMER 2.57* 2.34*  --  1.78*  FERRITIN  --   --  293 237  CRP 34.7*  --  27.1* 20.6*    Lab Results  Component Value Date   SARSCOV2NAA POSITIVE (A) 01/23/2019     Patient did require vent support initially, her sedation was weaned earlier today, as well she was able to be extubated today, . -Currently on vancomycin and aztreonam, clindamycin was discontinued, aztreonam was changed to meropenem, started Diflucan 5/23 . -On IV Solu-Medrol, will start weaning today .last dose tomorrow  -Did not receive Actemra given her recent severe infection (Fournier'sgangrene )f -on Remedsivir , initiated 5/20 -Continue with vitamin C Vitamin C and Zinc: On vitamin  C -Continue to follow inflammatory markers - patient  is on higher dose Lovenox per COVID 19 ICU protocol DVT prophylaxis . -P chest x-ray showing persistent severe multilobular pneumonia  Fournier's gangrene/sepsis present on admission - She underwent surgical debridement on 5/18.  Continue with the dressing changes wet  to dry BID - patient was initially on broad-spectrum coverage with vancomycin and aztreonam and clindamycin.  Intraoperative culture growing Streptococcus angiosis and MSSA, negative rods, she is currently on meropenem, as well intraoperative culture growing fungus, so she is kept on Diflucan. -Discussed with general surgery today, to see if they can assess her wound via video link and give further recommendation., (Please see another progress note today with attached picture of her wound)  Diabetes mellitus type 2 uncontrolled with peripheral vascular disease - Apparently patient is noncompliant with insulin.  Has had a few episodes of hypoglycemia during this hospitalization.     -CBG better controlled after increasing her Levemir .change her sliding scale to 3 times daily AC . -Poorly controlled, HbA1c 11.3.    Hypernatremia -Holding diuresis, will encourage her to increase her fluid intake - worsening , increased to 151 to  Normocytic anemia Drop in hemoglobin is noted.  No evidence of overt bleeding.  Could be surgical loss versus acute illness versus hemodilution.   - Patient with known history of B12 deficiency.but B12 level significantly elevated at 6,256 , will D/C supplement.  Acute renal failure with hypokalemia and hypomagnesemia -creatinine peaked at 1.6,, hold nephrotoxic medications, to keep on the dry side  given her COVID-19 , holding lasix due to hypernatremia -  Echocardiogram done in 2018 showed normal systolic function. -Improving, creatinine of 1.2 today  Coronary artery disease Stable.  She also has a history of peripheral vascular disease.   Apparently history of PCI as well.  Had MI in 2015.  Previously on aspirin and Plavix but the last few notes from PCP did not mention Plavix.  Continue only aspirin for now.     History of asthma Currently stable.  Morbid obesity BMI 37.36.  History of active smoking Will need counseling eventually     DVT Prophylaxis: On higher dose of Lovenox per COVID ICU protocol  Lab Results  Component Value Date   PLT 283 01/28/2019     PUD Prophylaxis: Protonix Code Status: Full code Family Communication: updated Husband yesterday, Tried to call today, with no answer. Disposition Plan: Will remain in ICU     Consultants: Pulmonology.  General surgery has been following  Procedures: Incision and debridement of necrotizing soft tissue in the perineum and buttock area on 5/18  Antibiotics: Anti-infectives (From admission, onward)   Start     Dose/Rate Route Frequency Ordered Stop   01/29/19 1000  fluconazole (DIFLUCAN) IVPB 200 mg  Status:  Discontinued     200 mg 100 mL/hr over 60 Minutes Intravenous Every 24 hours 01/28/19 0923 01/29/19 0735   01/29/19 1000  fluconazole (DIFLUCAN) IVPB 400 mg     400 mg 100 mL/hr over 120 Minutes Intravenous Every 24 hours 01/29/19 0735     01/28/19 1000  fluconazole (DIFLUCAN) IVPB 800 mg     800 mg 100 mL/hr over 240 Minutes Intravenous  Once 01/28/19 0810 01/28/19 1400   01/28/19 0745  fluconazole (DIFLUCAN) IVPB 400 mg  Status:  Discontinued     400 mg 100 mL/hr over 120 Minutes Intravenous Every 24 hours 01/28/19 0740 01/28/19 0810   01/27/19 1430  remdesivir 100 mg in sodium chloride 0.9 % 230 mL IVPB     100 mg over 30 Minutes Intravenous Every 24 hours 01/27/19 1339 02/05/19 1429   01/26/19 2200  meropenem (MERREM) 1 g in sodium chloride 0.9 % 100 mL IVPB     1 g 200 mL/hr over 30 Minutes Intravenous Every 8 hours 01/26/19 1453     01/26/19 1415  remdesivir 100 mg in sodium chloride 0.9 % 250 mL IVPB  Status:  Discontinued     100  mg over 30 Minutes Intravenous Every 24 hours 01/25/19 1421 01/27/19 1340   01/26/19 1400  vancomycin (VANCOCIN) IVPB 750 mg/150 ml premix  Status:  Discontinued     750 mg 150 mL/hr over 60 Minutes Intravenous Every 12 hours 01/26/19 1326 01/27/19 1330   01/25/19 1430  remdesivir 200 mg in sodium chloride 0.9 % 250 mL IVPB     200 mg over 30 Minutes Intravenous Once 01/25/19 1421 01/25/19 1949   01/24/19 2200  vancomycin (VANCOCIN) 1,250 mg in sodium chloride 0.9 % 250 mL IVPB  Status:  Discontinued     1,250 mg 166.7 mL/hr over 90 Minutes Intravenous Every 12 hours 01/24/19 1508 01/26/19 1046   01/23/19 1215  aztreonam (AZACTAM) 2 g in sodium chloride 0.9 % 100 mL IVPB  Status:  Discontinued     2 g 200 mL/hr over 30 Minutes Intravenous To Surgery 01/23/19 1207 01/23/19 1350   01/23/19 1200  vancomycin (VANCOCIN) IVPB 1000 mg/200 mL premix  Status:  Discontinued     1,000 mg 200 mL/hr  over 60 Minutes Intravenous Every 12 hours 01/22/19 2359 01/24/19 1508   01/23/19 0800  clindamycin (CLEOCIN) IVPB 900 mg  Status:  Discontinued     900 mg 100 mL/hr over 30 Minutes Intravenous Every 8 hours 01/23/19 0035 01/26/19 1451   01/23/19 0000  clindamycin (CLEOCIN) IVPB 900 mg     900 mg 100 mL/hr over 30 Minutes Intravenous  Once 01/22/19 2350 01/23/19 0101   01/23/19 0000  aztreonam (AZACTAM) 2 g in sodium chloride 0.9 % 100 mL IVPB  Status:  Discontinued     2 g 200 mL/hr over 30 Minutes Intravenous Every 8 hours 01/22/19 2353 01/26/19 1453   01/23/19 0000  vancomycin (VANCOCIN) 1,500 mg in sodium chloride 0.9 % 500 mL IVPB     1,500 mg 250 mL/hr over 120 Minutes Intravenous  Once 01/22/19 2353 01/23/19 1930        Medications:  Scheduled: . [START ON 01/31/2019] amLODipine  5 mg Oral Daily  . aspirin  81 mg Oral Daily  . Chlorhexidine Gluconate Cloth  6 each Topical Daily  . enoxaparin (LOVENOX) injection  0.5 mg/kg Subcutaneous Q12H  . fentaNYL (SUBLIMAZE) injection  100 mcg  Intravenous Once  . free water  300 mL Per Tube Q3H  . insulin aspart  0-20 Units Subcutaneous Q4H  . insulin aspart  6 Units Subcutaneous Q4H  . insulin detemir  20 Units Subcutaneous BID  . methylPREDNISolone (SOLU-MEDROL) injection  40 mg Intravenous Q12H  . [START ON 01/31/2019] multivitamin with minerals  1 tablet Oral Daily  . pantoprazole  40 mg Oral Daily  . polyethylene glycol  17 g Per Tube Daily  . rocuronium  80 mg Intravenous Once  . sodium chloride flush  10-40 mL Intracatheter Q12H  . [START ON 01/31/2019] vitamin C  1,000 mg Oral Daily   Continuous: . fluconazole (DIFLUCAN) IV 100 mL/hr at 01/30/19 1100  . meropenem (MERREM) IV 200 mL/hr at 01/30/19 0600  . remdesivir 100 mg in NS 250 mL     VQM:GQQPYPPJKDTOI (TYLENOL) oral liquid 160 mg/5 mL, albuterol, ALPRAZolam, hydrALAZINE, HYDROmorphone (DILAUDID) injection, midazolam, sodium chloride flush   Objective:  Vital Signs  Vitals:   01/30/19 0924 01/30/19 0925 01/30/19 1000 01/30/19 1100  BP: (!) 162/92  (!) 178/87 (!) 140/109  Pulse: 96 90 85 69  Resp: (!) 27 (!) 24 19 16   Temp:      TempSrc:      SpO2: 100% 100% 100% 100%  Weight:      Height:        Intake/Output Summary (Last 24 hours) at 01/30/2019 1207 Last data filed at 01/30/2019 1100 Gross per 24 hour  Intake 1341.67 ml  Output 3225 ml  Net -1883.33 ml   Filed Weights   01/27/19 0351 01/29/19 0400 01/30/19 0500  Weight: 117.4 kg 108.6 kg 115.5 kg   Patient awake, alert, appropriate, recently extubated, Good air entry bilaterally, clear to auscultation Rate and rhythm, no rubs murmurs or gallops Abd Soft, nontender, nondistended, bowel sounds present Extremitieswith no edema, clubbing or cyanosis   Lab Results:  Data Reviewed: I have personally reviewed following labs and imaging studies  CBC: Recent Labs  Lab 01/25/19 0545  01/26/19 0341  01/27/19 0512 01/27/19 0535 01/28/19 0151 01/29/19 0256 01/30/19 0115  WBC 23.7*  --   18.9*  --   --  20.7* 20.2* 12.2* 14.8*  NEUTROABS 19.4*  --   --   --   --   --   --   --   --  HGB 10.9*   < > 8.6*   < > 8.5* 8.3* 8.0* 7.9* 8.9*  HCT 31.4*   < > 25.3*   < > 25.0* 24.3* 23.6* 23.4* 27.2*  MCV 79.7*  --  82.4  --   --  82.9 84.0 84.2 84.7  PLT 257  --  241  --   --  271 283 284 339   < > = values in this interval not displayed.    Basic Metabolic Panel: Recent Labs  Lab 01/23/19 1436  01/25/19 0545  01/26/19 0341  01/26/19 1600 01/27/19 0512 01/27/19 0535 01/27/19 1607 01/28/19 0150 01/28/19 0151 01/29/19 0256 01/30/19 0115  NA 136  --  137   < > 141   < >  --  142 142  --   --  144 148* 151*  K 3.4*  --  3.2*   < > 3.2*   < >  --  3.5 3.2*  --   --  3.9 4.0 3.8  CL 104  --  103  --  110  --   --   --  112*  --   --  114* 120* 123*  CO2 18*  --  24  --  24  --   --   --  19*  --   --  22 22 22   GLUCOSE 259*  --  219*  --  77  --   --   --  230*  --   --  267* 285* 231*  BUN 6  --  <5*  --  11  --   --   --  34*  --   --  51* 54* 63*  CREATININE 0.59  --  0.49  --  1.12*  --   --   --  1.52*  --   --  1.60* 1.30* 1.20*  CALCIUM 8.3*  --  8.0*  --  7.4*  --   --   --  6.8*  --   --  7.8* 7.9* 8.5*  MG  --    < > 1.4*  --  1.5*  --  1.3*  --  2.0 1.1* 3.1*  --   --   --   PHOS 3.4  --  3.3  --   --   --  3.3  --  4.1 2.1*  --   --   --   --    < > = values in this interval not displayed.    GFR: Estimated Creatinine Clearance: 78.2 mL/min (A) (by C-G formula based on SCr of 1.2 mg/dL (H)).  Liver Function Tests: Recent Labs  Lab 01/26/19 0341 01/27/19 0535 01/28/19 0151 01/29/19 0256 01/30/19 0115  AST 36 24 16 39 20  ALT 35 35 30 40 40  ALKPHOS 105 110 100 99 91  BILITOT 0.8 0.2* 0.3 <0.1* 0.1*  PROT 5.7* 5.5* 5.9* 5.9* 6.2*  ALBUMIN 2.0* 1.7* 1.7* 1.7* 1.9*     Coagulation Profile: No results for input(s): INR, PROTIME in the last 168 hours.  Cardiac Enzymes: No results for input(s): CKTOTAL, CKMB, CKMBINDEX, TROPONINI in the last 168  hours.  HbA1C: No results for input(s): HGBA1C in the last 72 hours.  CBG: Recent Labs  Lab 01/29/19 1155 01/29/19 1559 01/29/19 1924 01/29/19 2325 01/30/19 0737  GLUCAP 198* 157* 186* 222* 150*    Lipid Profile: Recent Labs    01/29/19 0256 01/30/19 0115  TRIG 167* 158*  Anemia Panel: Recent Labs    01/29/19 0256 01/29/19 0257 01/30/19 0115  VITAMINB12 6,256*  --   --   FOLATE  --  14.7  --   FERRITIN 227  --  190  TIBC 127*  --   --   IRON 33  --   --   RETICCTPCT 0.9  --   --     Recent Results (from the past 240 hour(s))  Culture, blood (routine x 2)     Status: None   Collection Time: 01/22/19  8:40 PM  Result Value Ref Range Status   Specimen Description BLOOD LEFT HAND  Final   Special Requests   Final    BOTTLES DRAWN AEROBIC AND ANAEROBIC Blood Culture adequate volume   Culture   Final    NO GROWTH 5 DAYS Performed at [facility], [place] [address] [place], [place] [address]    Report Status 01/27/2019 FINAL  Final  Culture, blood (routine x 2)     Status: None   Collection Time: 01/22/19  8:58 PM  Result Value Ref Range Status   Specimen Description BLOOD RIGHT ANTECUBITAL  Final   Special Requests   Final    BOTTLES DRAWN AEROBIC ONLY Blood Culture adequate volume   Culture   Final    NO GROWTH 5 DAYS Performed at [facility], [place] [address], [place], [place] [address]    Report Status 01/27/2019 FINAL  Final  SARS Coronavirus 2 (CEPHEID - Performed in [place] hospital lab), Hosp Order     Status: Abnormal   Collection Time: 01/23/19  1:19 AM  Result Value Ref Range Status   SARS Coronavirus 2 POSITIVE (A) NEGATIVE Final    Comment: RESULT CALLED TO, READ BACK BY AND VERIFIED WITH: R FITZGERAKD ANSTI @0235  01/23/19 BY S GEZAHEGN (NOTE) If result is NEGATIVE SARS-CoV-2 target nucleic acids are NOT DETECTED. The SARS-CoV-2 RNA is generally detectable in upper and lower  respiratory specimens during the acute phase of  infection. The lowest  concentration of SARS-CoV-2 viral copies this assay can detect is 250  copies / mL. A negative result does not preclude SARS-CoV-2 infection  and should not be used as the sole basis for treatment or other  patient management decisions.  A negative result may occur with  improper specimen collection / handling, submission of specimen other  than nasopharyngeal swab, presence of viral mutation(s) within the  areas targeted by this assay, and inadequate number of viral copies  (<250 copies / mL). A negative result must be combined with clinical  observations, patient history, and epidemiological information. If result is POSITIVE SARS-CoV-2 target nucleic acids are D ETECTED. The SARS-CoV-2 RNA is generally detectable in upper and lower  respiratory specimens during the acute phase of infection.  Positive  results are indicative of active infection with SARS-CoV-2.  Clinical  correlation with patient history and other diagnostic information is  necessary to determine patient infection status.  Positive results do  not rule out bacterial infection or co-infection with other viruses. If result is PRESUMPTIVE POSTIVE SARS-CoV-2 nucleic acids MAY BE PRESENT.   A presumptive positive result was obtained on the submitted specimen  and confirmed on repeat testing.  While 2019 novel coronavirus  (SARS-CoV-2) nucleic acids may be present in the submitted sample  additional confirmatory testing may be necessary for epidemiological  and / or clinical management purposes  to differentiate between  SARS-CoV-2 and other Sarbecovirus currently known to infect  humans.  If clinically indicated additional testing with an alternate test  methodology [phone]) is advised. The SARS-CoV-2 RNA is generally  detectable in upper and lower respiratory specimens during the acute  phase of infection. The expected result is Negative. Fact Sheet for Patients:   StrictlyIdeas.no Fact Sheet for Healthcare Providers: BankingDealers.co.za This test is not yet approved or cleared by the [place] FDA and has been authorized for detection and/or diagnosis of SARS-CoV-2 by FDA under an Emergency Use Authorization (EUA).  This EUA will remain in effect (meaning this test can be used) for the duration of the COVID-19 declaration under Section 564(b)(1) of the Act, 21 U.S.C. section 360bbb-3(b)(1), unless the authorization is terminated or revoked sooner. Performed at [facility], [place] [address] [place], [place] [address]   Aerobic/Anaerobic Culture (surgical/deep wound)     Status: None   Collection Time: 01/23/19 12:16 PM  Result Value Ref Range Status   Specimen Description ABSCESS PERINEUM  Final   Special Requests PATIENT ON FOLLOWING CLEOCIN AND AZACTAM  Final   Gram Stain   Final    ABUNDANT WBC PRESENT,BOTH PMN AND MONONUCLEAR ABUNDANT GRAM NEGATIVE RODS MODERATE GRAM VARIABLE ROD FEW GRAM POSITIVE COCCI Performed at [facility], [place] [address], [place], [place] [address]    Culture   Final    FEW STREPTOCOCCUS ANGINOSIS MIXED ANAEROBIC FLORA PRESENT.  CALL LAB IF FURTHER IID REQUIRED.    Report Status 01/26/2019 FINAL  Final   Organism ID, Bacteria STREPTOCOCCUS ANGINOSIS  Final      Susceptibility   Streptococcus anginosis - MIC*    PENICILLIN 0.12 SENSITIVE Sensitive     CEFTRIAXONE 0.5 SENSITIVE Sensitive     ERYTHROMYCIN >=8 RESISTANT Resistant     LEVOFLOXACIN 1 SENSITIVE Sensitive     VANCOMYCIN 0.5 SENSITIVE Sensitive     * FEW STREPTOCOCCUS ANGINOSIS  Fungus Culture With Stain     Status: None   Collection Time: 01/23/19 12:41 PM  Result Value Ref Range Status   Fungus Stain Final report  Final   Fungus (Mycology) Culture Preliminary report  Final    Comment: RESULT CALLED TO, READ BACK BY AND VERIFIED WITH: RN Mellody Drown [phone] LP (NOTE) REPORTED  POSITIVE RESULT TO LISA PITT AT 7:40 PM 01/27/2019 FAXED TO [phone] Performed At: [facility] [address] [place], [place] [id number] Rush Farmer MD [id number]    Fungal Source TISSUE  Final    Comment: PERINEUM Performed at [facility], [place] [address] [place], [place] [address]   Aerobic/Anaerobic Culture (surgical/deep wound)     Status: None   Collection Time: 01/23/19 12:41 PM  Result Value Ref Range Status   Specimen Description TISSUE PERINEUM  Final   Special Requests PATIENT ON FOLLOWING CLEOCIN AND AZACTAM  Final   Gram Stain   Final    MODERATE WBC PRESENT,BOTH PMN AND MONONUCLEAR ABUNDANT GRAM NEGATIVE RODS FEW GRAM VARIABLE ROD FEW GRAM POSITIVE COCCI    Culture   Final    RARE STAPHYLOCOCCUS AUREUS RARE STREPTOCOCCUS ANGINOSIS ABUNDANT ANAEROBIC GRAM NEGATIVE ROD BETA LACTAMASE POSITIVE Performed at [facility], [place] [address] [place], [place] [address]    Report Status 01/27/2019 FINAL  Final   Organism ID, Bacteria STREPTOCOCCUS ANGINOSIS  Final   Organism ID, Bacteria STAPHYLOCOCCUS AUREUS  Final      Susceptibility   Staphylococcus aureus - MIC*    CIPROFLOXACIN <=0.5 SENSITIVE Sensitive  ERYTHROMYCIN <=0.25 SENSITIVE Sensitive     GENTAMICIN <=0.5 SENSITIVE Sensitive     OXACILLIN <=0.25 SENSITIVE Sensitive     TETRACYCLINE <=1 SENSITIVE Sensitive     VANCOMYCIN <=0.5 SENSITIVE Sensitive     TRIMETH/SULFA <=10 SENSITIVE Sensitive     CLINDAMYCIN <=0.25 SENSITIVE Sensitive     RIFAMPIN <=0.5 SENSITIVE Sensitive     Inducible Clindamycin NEGATIVE Sensitive     * RARE STAPHYLOCOCCUS AUREUS   Streptococcus anginosis - MIC*    PENICILLIN <=0.06 SENSITIVE Sensitive     CEFTRIAXONE 0.5 SENSITIVE Sensitive     ERYTHROMYCIN >=8 RESISTANT Resistant     LEVOFLOXACIN 0.5 SENSITIVE Sensitive     VANCOMYCIN 0.5 SENSITIVE Sensitive     * RARE STREPTOCOCCUS ANGINOSIS  Fungus Culture Result     Status: None   Collection  Time: 01/23/19 12:41 PM  Result Value Ref Range Status   Result 1 Comment  Final    Comment: (NOTE) KOH/Calcofluor preparation:  no fungus observed. Performed At: [facility] [place], [place] [id number] Rush Farmer MD [id number]   Fungal organism reflex     Status: Abnormal   Collection Time: 01/23/19 12:41 PM  Result Value Ref Range Status   Fungal result 1 Comment (A)  Final    Comment: (NOTE) Yeast isolated, identification in progress. Performed At: [facility] [place], [place] [id number] Rush Farmer MD [id number]       Radiology Studies: Dg Chest Port 1 View  Result Date: 01/29/2019 CLINICAL DATA:  43 year old female tested positive for COVID-19. EXAM: PORTABLE CHEST 1 VIEW COMPARISON:  Chest x-ray 01/28/2019. FINDINGS: An endotracheal tube is in place with tip 3.0 cm above the carina. There is a right upper extremity PICC with tip terminating in the superior cavoatrial junction. A nasogastric tube is seen extending into the stomach, however, the tip of the nasogastric tube extends below the lower margin of the image. Lung volumes are low. Severe patchy multifocal interstitial and airspace disease throughout the lungs bilaterally, with no significant change in aeration compared to yesterday's examination. No pleural effusions. No evidence of pulmonary edema. Heart size is normal. Upper mediastinal contours are within normal limits. IMPRESSION: 1. Support apparatus, as above. 2. Severe multilobar pneumonia redemonstrated with no significant change in aeration compared to yesterday's examination. Electronically Signed   By: Vinnie Langton M.D.   On: 01/29/2019 07:23       LOS: 7 days   Phillips Climes MD   [facility] Pager on [email or web address]  01/30/2019, 12:07 PM

## 2019-01-30 NOTE — Progress Notes (Signed)
Pt's husband called and updated on her condition. Pt is sleeping now after getting Dilaudid prior to her dressing change, so I told him I would have her call him on her phone when she is awake.

## 2019-01-30 NOTE — Progress Notes (Signed)
Husband video chatted with the patient for approx. 30 minutes. Via elink camera.

## 2019-01-30 NOTE — Progress Notes (Signed)
Pt extubated to face mask at 0920 with Collie Siad, RT and RN. Pt alert and oriented. Switched to 4L [place] O2 at 0950. Pt instructed on Incentive Spirometry usage and she demonstrates understanding. Pt now [place] her son and daughter, who were given update on their mom's progress.

## 2019-01-31 ENCOUNTER — Ambulatory Visit: Payer: Self-pay | Admitting: Endocrinology

## 2019-01-31 DIAGNOSIS — E870 Hyperosmolality and hypernatremia: Secondary | ICD-10-CM

## 2019-01-31 LAB — CBC
HCT: 30.2 % — ABNORMAL LOW (ref 36.0–46.0)
Hemoglobin: 9.5 g/dL — ABNORMAL LOW (ref 12.0–15.0)
MCH: 26.9 pg (ref 26.0–34.0)
MCHC: 31.5 g/dL (ref 30.0–36.0)
MCV: 85.6 fL (ref 80.0–100.0)
Platelets: 336 10*3/uL (ref 150–400)
RBC: 3.53 MIL/uL — ABNORMAL LOW (ref 3.87–5.11)
RDW: 14.8 % (ref 11.5–15.5)
WBC: 18.3 10*3/uL — ABNORMAL HIGH (ref 4.0–10.5)
nRBC: 0 % (ref 0.0–0.2)

## 2019-01-31 LAB — COMPREHENSIVE METABOLIC PANEL
ALT: 31 U/L (ref 0–44)
AST: 16 U/L (ref 15–41)
Albumin: 1.9 g/dL — ABNORMAL LOW (ref 3.5–5.0)
Alkaline Phosphatase: 89 U/L (ref 38–126)
Anion gap: 6 (ref 5–15)
BUN: 49 mg/dL — ABNORMAL HIGH (ref 6–20)
CO2: 25 mmol/L (ref 22–32)
Calcium: 8.5 mg/dL — ABNORMAL LOW (ref 8.9–10.3)
Chloride: 120 mmol/L — ABNORMAL HIGH (ref 98–111)
Creatinine, Ser: 1.12 mg/dL — ABNORMAL HIGH (ref 0.44–1.00)
GFR calc Af Amer: >60 mL/min (ref >60)
GFR calc non Af Amer: >60 mL/min (ref >60)
Glucose, Bld: 102 mg/dL — ABNORMAL HIGH (ref 70–99)
Potassium: 3.1 mmol/L — ABNORMAL LOW (ref 3.5–5.1)
Sodium: 151 mmol/L — ABNORMAL HIGH (ref 135–145)
Total Bilirubin: 0.2 mg/dL — ABNORMAL LOW (ref 0.3–1.2)
Total Protein: 5.9 g/dL — ABNORMAL LOW (ref 6.5–8.1)

## 2019-01-31 LAB — D-DIMER, QUANTITATIVE: D-Dimer, Quant: 3.42 ug/mL-FEU — ABNORMAL HIGH (ref 0.00–0.50)

## 2019-01-31 LAB — GLUCOSE, CAPILLARY
Glucose-Capillary: 175 mg/dL — ABNORMAL HIGH (ref 70–99)
Glucose-Capillary: 83 mg/dL (ref 70–99)
Glucose-Capillary: 135 mg/dL — ABNORMAL HIGH (ref 70–99)
Glucose-Capillary: 139 mg/dL — ABNORMAL HIGH (ref 70–99)

## 2019-01-31 LAB — C-REACTIVE PROTEIN: CRP: 3.9 mg/dL — ABNORMAL HIGH (ref <1.0)

## 2019-01-31 MED ORDER — AMLODIPINE BESYLATE 5 MG PO TABS
5.0000 mg | ORAL_TABLET | Freq: Once | ORAL | Status: AC
  Administered 2019-01-31: 15:00:00 5 mg via ORAL
  Filled 2019-01-31: qty 1

## 2019-01-31 MED ORDER — INSULIN ASPART 100 UNIT/ML ~~LOC~~ SOLN
5.0000 [IU] | Freq: Three times a day (TID) | SUBCUTANEOUS | Status: DC
  Administered 2019-02-01: 09:00:00 5 [IU] via SUBCUTANEOUS

## 2019-01-31 MED ORDER — ACETAMINOPHEN 325 MG PO TABS
650.0000 mg | ORAL_TABLET | Freq: Four times a day (QID) | ORAL | Status: DC | PRN
  Filled 2019-01-31 (×2): qty 2

## 2019-01-31 MED ORDER — GUAIFENESIN-DM 100-10 MG/5ML PO SYRP
5.0000 mL | ORAL_SOLUTION | ORAL | Status: DC | PRN
  Administered 2019-02-01: 5 mL via ORAL
  Filled 2019-01-31: qty 5

## 2019-01-31 MED ORDER — POTASSIUM CHLORIDE 10 MEQ/50ML IV SOLN
10.0000 meq | INTRAVENOUS | Status: AC
  Administered 2019-01-31 (×5): 10 meq via INTRAVENOUS
  Filled 2019-01-31 (×3): qty 50

## 2019-01-31 MED ORDER — AMLODIPINE BESYLATE 5 MG PO TABS
10.0000 mg | ORAL_TABLET | Freq: Every day | ORAL | Status: DC
  Administered 2019-02-01 – 2019-02-06 (×6): 10 mg via ORAL
  Filled 2019-01-31 (×6): qty 2

## 2019-01-31 MED ORDER — SODIUM CHLORIDE 0.9 % IV SOLN
100.0000 mg | INTRAVENOUS | Status: AC
  Administered 2019-01-31: 100 mg via INTRAVENOUS
  Filled 2019-01-31 (×2): qty 20

## 2019-01-31 MED ORDER — SODIUM CHLORIDE 0.9 % IV SOLN
100.0000 mg | INTRAVENOUS | Status: DC
  Administered 2019-02-01 – 2019-02-06 (×6): 100 mg via INTRAVENOUS
  Filled 2019-01-31 (×6): qty 100

## 2019-01-31 MED ORDER — INSULIN DETEMIR 100 UNIT/ML ~~LOC~~ SOLN
16.0000 [IU] | Freq: Two times a day (BID) | SUBCUTANEOUS | Status: DC
  Administered 2019-01-31 – 2019-02-06 (×13): 16 [IU] via SUBCUTANEOUS
  Filled 2019-01-31 (×14): qty 0.16

## 2019-01-31 MED ORDER — SODIUM CHLORIDE 0.9 % IV SOLN
200.0000 mg | Freq: Once | INTRAVENOUS | Status: AC
  Administered 2019-01-31: 200 mg via INTRAVENOUS
  Filled 2019-01-31: qty 200

## 2019-01-31 NOTE — Progress Notes (Signed)
Patient states she when she was taken to [facility] she had clothing, a wallet (with [employer] debit card, [employer] one credit card, her social security card, her son's social security card and [employer] insurance card), black dress, black flip flops, her cell phone and charger.   She states that when she went to the operating room she had a bag with all of these belongings. Today 5/26 she has her cell phone and charger and nothing else.   I have called security at [facility] who went to the emergency room, 2west, 30midwest, and OR/PACU to inquire about the missing items. I also called each of these areas.  They were not able to locate anything.    There is documentation on 5/18 at 0206 that "a bag of goods to PACU and labeled" by a Coralyn Pear, RN and no further documentation about belongings that I found.   I put a call into [facility] to see if it was found in the truck that transported her. The patient was updated about all of this and I stated I would place this report. She also updated her family.

## 2019-01-31 NOTE — Progress Notes (Signed)
[name] Progress Note Patient Name: Maureen Price DOB: Nov 16, 1975 MRN: [id number]   Date of Service  01/31/2019  HPI/Events of Note  Hypokalemia - K+ = 3.1 and Creatinine = 1.12.   eICU Interventions  Will order: 1. Replace K+.      Intervention Category Major Interventions: Electrolyte abnormality - evaluation and management  Sommer,Steven Eugene 01/31/2019, 6:01 AM

## 2019-01-31 NOTE — Evaluation (Addendum)
Physical Therapy Evaluation Patient Details Name: Maureen Price MRN: [id number] DOB: [birth year] Today's Date: 01/31/2019   History of Present Illness  43 y.o. female who worked at [employer] with [place] of asthma, poorly controlled type II DM, GERD, depression, CAD S/P PCI, active smoker, obesity; admitted on 01/22/2019, presented with complaint of perineal pain, was found to have Fournier's gangrene.  S/P incision and debridement 01/23/2019.  Her SARS-CoV-2 was positive.  01/25/2019 patient became more hypoxic, unable to maintain adequate saturation on nonrebreather.  She was intubated and then subsequently transferred to the [facility].  ETT 5/20-25.      Clinical Impression  Patient is s/p above surgery resulting in functional limitations due to the deficits listed below (see PT Problem List). Patient currently limited by generalized weakness, decr cardiopulmonary status (RR up to 25, BP incr from 155/83 to 177/92), and pain s/p I & D. Patient lives alone (although has multiple family members that live nearby), therefore PT is indicated to progress her towards modified independent. Patient will benefit from skilled PT to increase their independence and safety with mobility to allow discharge to the venue listed below.       Follow Up Recommendations Home health PT    Equipment Recommendations  (? rollator vs RW)    Recommendations for Other Services       Precautions / Restrictions Precautions Precautions: Fall Precaution Comments: due to leg weakness, not imbalance Restrictions Weight Bearing Restrictions: No      Mobility  Bed Mobility Overal bed mobility: Needs Assistance Bed Mobility: Supine to Sit;Sit to Supine     Supine to sit: Min assist Sit to supine: Min assist   General bed mobility comments: Min A for pushing up into sitting. Min A for managing BLEs over OEB   Transfers Overall transfer level: Needs assistance Equipment used: Rolling walker (2  wheeled) Transfers: Sit to/from Stand Sit to Stand: Min guard;+2 safety/equipment;From elevated surface         General transfer comment: from air mattress; did not transfer to chair due to no air cushion available for chair  Ambulation/Gait Ambulation/Gait assistance: Min assist;+2 safety/equipment Gait Distance (Feet): 2 Feet(sidestep to [facility]) Assistive device: Rolling walker (2 wheeled) Gait Pattern/deviations: Step-to pattern     General Gait Details: pt did not feel safe attempting stepping forward away from the EOB due to leg weakness  Stairs            Wheelchair Mobility    Modified Rankin (Stroke Patients Only)       Balance Overall balance assessment: Independent(able to stand at EOB without UE support )                                           Pertinent Vitals/Pain Pain Assessment: Faces Faces Pain Scale: No hurt Pain Location: Peri area Pain Descriptors / Indicators: Constant;Discomfort;Grimacing Pain Intervention(s): Limited activity within patient's tolerance;Monitored during session;Patient requesting pain meds-RN notified;RN gave pain meds during session    Home Living Family/patient expects to be discharged to:: Private residence Living Arrangements: Alone Available Help at Discharge: Family;Available 24 hours/day Type of Home: Apartment(first floor) Home Access: Level entry     Home Layout: One level Home Equipment: None      Prior Function Level of Independence: Independent         Comments: Works as a Quarry manager at [employer]  Hand Dominance   Dominant Hand: Right    Extremity/Trunk Assessment   Upper Extremity Assessment Upper Extremity Assessment: Defer to OT evaluation    Lower Extremity Assessment Lower Extremity Assessment: Generalized weakness    Cervical / Trunk Assessment Cervical / Trunk Assessment: Normal  Communication   Communication: No difficulties  Cognition Arousal/Alertness:  Awake/alert Behavior During Therapy: WFL for tasks assessed/performed Overall Cognitive Status: Within Functional Limits for tasks assessed                                 General Comments: Following commands and interacting appropiately in conversation      General Comments      Exercises     Assessment/Plan    PT Assessment Patient needs continued PT services  PT Problem List Decreased activity tolerance;Decreased mobility;Decreased knowledge of use of DME;Cardiopulmonary status limiting activity;Obesity;Decreased skin integrity;Pain       PT Treatment Interventions DME instruction;Gait training;Functional mobility training;Therapeutic activities;Therapeutic exercise;Patient/family education    PT Goals (Current goals can be found in the Care Plan section)  Acute Rehab PT Goals Patient Stated Goal: be able to return home alone to care for herself PT Goal Formulation: With patient Time For Goal Achievement: 02/14/19 Potential to Achieve Goals: Good    Frequency Min 5X/week   Barriers to discharge Decreased caregiver support lives alone; does have family nearby    Co-evaluation               AM-PAC PT "6 Clicks" Mobility  Outcome Measure Help needed turning from your back to your side while in a flat bed without using bedrails?: A Little Help needed moving from lying on your back to sitting on the side of a flat bed without using bedrails?: A Little Help needed moving to and from a bed to a chair (including a wheelchair)?: A Little Help needed standing up from a chair using your arms (e.g., wheelchair or bedside chair)?: A Lot Help needed to walk in hospital room?: A Lot Help needed climbing 3-5 steps with a railing? : A Lot 6 Click Score: 15    End of Session   Activity Tolerance: Patient limited by fatigue Patient left: in bed;with call bell/phone within reach;with nursing/sitter in room Nurse Communication: Mobility status(recommend air  mattress when moves to regular floor) PT Visit Diagnosis: Other abnormalities of gait and mobility (R26.89);Pain Pain - Right/Left: Right Pain - part of body: (perineal)    Time: 5974-1638 PT Time Calculation (min) (ACUTE ONLY): 31 min   Charges:   PT Evaluation $PT Eval Moderate Complexity: 1 Mod            [employer], PT 01/31/2019, 11:39 AM

## 2019-01-31 NOTE — Progress Notes (Signed)
Patient transferred to 132 from [facility] ICU. She is awake, oriented and on room air. Other vitals WNL. She is concerned about her personal belongings (wallet with credit cards and clothing) that was potentially left at [facility]. She was a patient on 2MW and 2W. Both units called to look for items. Patient updated about the search. RN will also call security at [facility] to check for secured items.   Patient currently face timing with family about this issue and to update them on her status.

## 2019-01-31 NOTE — Progress Notes (Signed)
  Speech Language Pathology Treatment: Dysphagia  Patient Details Name: ANALILIA GEDDIS MRN: [id number] DOB: 12-07-1975 Today's Date: 01/31/2019 Time: 0258-5277 SLP Time Calculation (min) (ACUTE ONLY): 16 min  Assessment / Plan / Recommendation Clinical Impression  Upon SLP arrival pt was already drinking thin liquids via straw, stating that she wanted a [employer] and that she was accepting all potential consequences, knowing that SLP yesterday had recommended nectar thick liquids. Pt's voice is hoarse but she subjectively describes improved quality and volume today, sharing that she was better understood by her mother over the phone. Pt performed the three ounce water screen x2 with no stopping nor coughing observed. She had an immediate throat clear x1 and belching x2, which may be more suggestive of her h/o GERD as risk for aspiration is suggested to be low by her lack of coughing with this volume of liquid. Pt declined solids but said that she did not want anything "too dry." Will leave her diet soft but advance her liquids to thin. Additional f/u for tolerance indicated due to persistent dysphonia, although anticipate f/u to be brief.   HPI HPI: 43 y.o. female with PMH of asthma, poorly controlled type II DM, GERD, depression, CAD S/P PCI, active smoker, obesity; admitted on 01/22/2019, presented with complaint of perineal pain, was found to have Fournier's gangrene.  S/P incision and debridement 01/23/2019.  Her SARS-CoV-2 was positive.  01/25/2019 patient became more hypoxic, unable to maintain adequate saturation on nonrebreather.  She was intubated and then subsequently transferred to the [facility].  ETT 5/20-25.       SLP Plan  Continue with current plan of care       Recommendations  Diet recommendations: Dysphagia 3 (mechanical soft);Thin liquid Liquids provided via: Cup;Straw Medication Administration: Whole meds with liquid Supervision: Patient able to self  feed;Intermittent supervision to cue for compensatory strategies Compensations: Slow rate;Small sips/bites Postural Changes and/or Swallow Maneuvers: Seated upright 90 degrees                Oral Care Recommendations: Oral care BID Follow up Recommendations: (tba - none anticipated for swallowing) SLP Visit Diagnosis: Dysphagia, unspecified (R13.10) Plan: Continue with current plan of care       [place] Jeris Roser 01/31/2019, 11:42 AM  Pollyann Glen, M.A. [place] Acute Environmental education officer [phone] Office [phone]

## 2019-01-31 NOTE — Progress Notes (Signed)
NAME:  Maureen Price, MRN:  [id number], DOB:  02-16-1976, LOS: 28 ADMISSION DATE:  01/22/2019, CONSULTATION DATE: May 19 REFERRING MD: Dr. Posey Pronto, CHIEF COMPLAINT: Dyspnea  Brief History   43 year old female with poorly controlled diabetes initially mated on May 17 for Fournier's gangrene requiring surgical debridement, found to have COVID.  Developed ARDS on May 20 requiring intubation, transferred to [facility] same day.   Past Medical History  Diabetes mellitus type 2 Asthma Gastroesophageal reflux disease Depression Coronary artery disease Obesity Tobacco abuse  Significant Hospital Events   May 17 admitted for Fournier's gangrene May 18 surgical debridement May 20 worsening hypoxemia, ARDS, intubated, transferred to [place]:  General surgery PCCM  Procedures:  May 18 surgical debridement of gangrenous soft tissue May 20 ETT > 5/25  Significant Diagnostic Tests:  May 17 CT abdomen pelvis subcutaneous emphysema of soft tissue around the right aspect of anus, perineum, posterior right vulva, there is no discrete fluid collection appreciated however there was concern for gas-forming infection  Micro Data:  SARS COV 2 5/18: Positive 5/17: Blood cultures x2>>> negative 5/18: Fungal stain from abscess>>> positive >>  5/18: Fungal culture>>> positive >>  5/18: Abscess culture from deep wound >>  few Streptococcus anginosisand rare staph aureus, GNR >>   Antimicrobials:  Aztreonam May 17 > 5/21 Clindamycin May 17 > 5/21 Meropenem 5/21 >>  Vancomycin May 17 > 5/22 Remdesivir May 19 >   Interim history/subjective:   Extubated yesterday, doing well, tolerated wound dressing changes as long as saline was used to minimize pain.  Objective   Blood pressure (!) 148/79, pulse 69, temperature 98.4 F (36.9 C), temperature source Oral, resp. rate 14, height 5' 5.5" (1.664 m), weight 115.5 kg, SpO2 99 %.    Vent Mode: PSV;CPAP  FiO2 (%):  [40 %-100 %] 100 % PEEP:  [5 cmH20] 5 cmH20 Pressure Support:  [5 cmH20] 5 cmH20   Intake/Output Summary (Last 24 hours) at 01/31/2019 6378 Last data filed at 01/30/2019 2200 Gross per 24 hour  Intake 1179.3 ml  Output 2300 ml  Net -1120.7 ml   Filed Weights   01/27/19 0351 01/29/19 0400 01/30/19 0500  Weight: 117.4 kg 108.6 kg 115.5 kg    Examination:  General:  Resting comfortably in bed HENT: NCAT OP clear PULM: CTA B, normal effort CV: RRR, no mgr GI: BS+, soft, nontender MSK: normal bulk and tone Neuro: awake, alert, no distress, MAEW    Resolved Hospital Problem list     Assessment & Plan:  ARDS COVID 19: Improved oxygenation Monitor O2 saturation Continue remdesivir per protocol, 5 days total  Nec Fasciitis: Stable Hydrotherapy consult today, wound care consult today  AKI improved Monitor BMET and UOP Replace electrolytes as needed  Nutrition Advance diet  PCCM to sign off today  Best practice:  Diet: advance diet Pain/Anxiety/Delirium protocol (if indicated): d/c VAP protocol (if indicated): d/c DVT prophylaxis: lovenox GI prophylaxis: famotidine Glucose control: SSI Mobility: PT consult, out of bed Code Status: full Family Communication: per [facility] Disposition: to floor  Labs   CBC: Recent Labs  Lab 01/25/19 0545  01/27/19 0535 01/28/19 0151 01/29/19 0256 01/30/19 0115 01/31/19 0215  WBC 23.7*   < > 20.7* 20.2* 12.2* 14.8* 18.3*  NEUTROABS 19.4*  --   --   --   --   --   --   HGB 10.9*   < > 8.3* 8.0* 7.9* 8.9* 9.5*  HCT 31.4*   < >  24.3* 23.6* 23.4* 27.2* 30.2*  MCV 79.7*   < > 82.9 84.0 84.2 84.7 85.6  PLT 257   < > 271 283 284 339 336   < > = values in this interval not displayed.    Basic Metabolic Panel: Recent Labs  Lab 01/25/19 0545  01/26/19 0341  01/26/19 1600  01/27/19 0535 01/27/19 1607 01/28/19 0150 01/28/19 0151 01/29/19 0256 01/30/19 0115 01/31/19 0215  NA 137   < > 141   < >  --    < > 142  --    --  144 148* 151* 151*  K 3.2*   < > 3.2*   < >  --    < > 3.2*  --   --  3.9 4.0 3.8 3.1*  CL 103  --  110  --   --   --  112*  --   --  114* 120* 123* 120*  CO2 24  --  24  --   --   --  19*  --   --  22 22 22 25   GLUCOSE 219*  --  77  --   --   --  230*  --   --  267* 285* 231* 102*  BUN <5*  --  11  --   --   --  34*  --   --  51* 54* 63* 49*  CREATININE 0.49  --  1.12*  --   --   --  1.52*  --   --  1.60* 1.30* 1.20* 1.12*  CALCIUM 8.0*  --  7.4*  --   --   --  6.8*  --   --  7.8* 7.9* 8.5* 8.5*  MG 1.4*  --  1.5*  --  1.3*  --  2.0 1.1* 3.1*  --   --   --   --   PHOS 3.3  --   --   --  3.3  --  4.1 2.1*  --   --   --   --   --    < > = values in this interval not displayed.   GFR: Estimated Creatinine Clearance: 83.8 mL/min (A) (by C-G formula based on SCr of 1.12 mg/dL (H)). Recent Labs  Lab 01/27/19 0535 01/28/19 0151 01/29/19 0256 01/30/19 0115 01/31/19 0215  PROCALCITON 0.84 0.60  --   --   --   WBC 20.7* 20.2* 12.2* 14.8* 18.3*    Liver Function Tests: Recent Labs  Lab 01/27/19 0535 01/28/19 0151 01/29/19 0256 01/30/19 0115 01/31/19 0215  AST 24 16 39 20 16  ALT 35 30 40 40 31  ALKPHOS 110 100 99 91 89  BILITOT 0.2* 0.3 <0.1* 0.1* 0.2*  PROT 5.5* 5.9* 5.9* 6.2* 5.9*  ALBUMIN 1.7* 1.7* 1.7* 1.9* 1.9*   No results for input(s): LIPASE, AMYLASE in the last 168 hours. No results for input(s): AMMONIA in the last 168 hours.  ABG    Component Value Date/Time   PHART 7.341 (L) 01/27/2019 0512   PCO2ART 39.1 01/27/2019 0512   PO2ART 109.0 (H) 01/27/2019 0512   HCO3 21.2 01/27/2019 0512   TCO2 22 01/27/2019 0512   ACIDBASEDEF 4.0 (H) 01/27/2019 0512   O2SAT 98.0 01/27/2019 0512     Coagulation Profile: No results for input(s): INR, PROTIME in the last 168 hours.  Cardiac Enzymes: No results for input(s): CKTOTAL, CKMB, CKMBINDEX, TROPONINI in the last 168 hours.  HbA1C: Hemoglobin A1C  Date/Time Value  Ref Range Status  02/28/2018 08:45 AM 10.5 (A) 4.0  - 5.6 % Final  06/23/2017 10:16 AM 11.7  Final   Hgb A1c MFr Bld  Date/Time Value Ref Range Status  01/23/2019 02:36 PM 11.3 (H) 4.8 - 5.6 % Final    Comment:    (NOTE) Pre diabetes:          5.7%-6.4% Diabetes:              >6.4% Glycemic control for   <7.0% adults with diabetes   09/19/2016 03:46 AM 13.1 (H) 4.8 - 5.6 % Final    Comment:    (NOTE)         Pre-diabetes: 5.7 - 6.4         Diabetes: >6.4         Glycemic control for adults with diabetes: <7.0     CBG: Recent Labs  Lab 01/30/19 0737 01/30/19 1204 01/30/19 1540 01/30/19 1749 01/30/19 2228  GLUCAP 150* 131* 106* 81 141*      Critical care time: n/a   Roselie Awkward, MD [place] [facility] Pager: [phone] Cell: [phone] If no response, call [phone]

## 2019-01-31 NOTE — Progress Notes (Addendum)
Progressive  Note  Maureen Price FMB:[id number] DOB: [birth year] DOA: 01/22/2019  PCP: [name], MD  Brief History/Interval Summary:  - Pt. with PMH of Asthma, poorly controlled type II DM, GERD, depression, CAD S/P PCI, active smoker, obesity; admitted on 01/22/2019, presented with complaint of perineal pain, was found to have Fournier's gangrene.  S/P incision and debridement 01/23/2019.  Her SARS-CoV-2 was positive.  01/25/2019 patient comes more hypoxic unable to maintain adequate saturation on nonrebreather.    She had to be transferred to the ICU.  She was intubated and then subsequently transferred to the [facility].    May 17 admitted for Fournier's gangrene May 18 surgical debridement of gangrenous soft tissue May 20 worsening hypoxemia, ARDS, intubated, transferred to [facility] May 25 Extubated  Reason for Visit: Acute respiratory failure with hypoxia secondary to COVID-19  Subjective/Interval History:  No significant events overnight per staff,   Assessment/Plan:  Acute Hypoxic Resp. Failure due to Acute Covid 19     Component Value Date/Time   PHART 7.341 (L) 01/27/2019 0512   PCO2ART 39.1 01/27/2019 0512   PO2ART 109.0 (H) 01/27/2019 0512   HCO3 21.2 01/27/2019 0512   TCO2 22 01/27/2019 0512   ACIDBASEDEF 4.0 (H) 01/27/2019 0512   O2SAT 98.0 01/27/2019 0512    COVID-19 Labs  Recent Labs    01/26/19 0341 01/27/19 0535 01/27/19 0545 01/28/19 0151  DDIMER 2.57* 2.34*  --  1.78*  FERRITIN  --   --  293 237  CRP 34.7*  --  27.1* 20.6*    Lab Results  Component Value Date   SARSCOV2NAA POSITIVE (A) 01/23/2019     Patient did require vent support initially, She was extubated on 5/25, she is on room air today. -Did not receive Actemra given her recent severe infection (Fournier'sgangrene ) - on Remedsivir , initiated 5/20, received total of 6 days, significantly improved, will discontinue today -Continue with vitamin C -  Vitamin C and Zinc: On vitamin C - Inflammatory markers trending down - patient  is on higher dose Lovenox per COVID 19 ICU protocol DVT prophylaxis . - chest x-ray showing persistent severe multilobular pneumonia - She is with leukocytosis today, but this is most likely in the setting of steroids which we have stopped today, she is afebrile, nontoxic-appearing  Fournier's gangrene/sepsis present on admission - She underwent surgical debridement on 5/18.  Continue with the dressing changes wet  to dry BID, yesterday regarding wound care, they did review her wound pictures, the will need post lavage, will give wound care recommendation today. - p initially on broad-spectrum coverage with vancomycin and aztreonam and clindamycin.  Intraoperative culture growing Streptococcus angiosis and MSSA, negative rods, she is currently on meropenem, as well intraoperative culture growing fungus, initially on Diflucan, was changed to micafungin on 5/26 given it is growing Candida glabrata .  Diabetes mellitus type 2 uncontrolled with peripheral vascular disease - Apparently patient is noncompliant with insulin.  Has had a few episodes of hypoglycemia during this hospitalization.     -Overall CBG has been better controlled, she had couple readings on the lower side, so I have her Levemir, and usual pre-meal NovoLog . -Poorly controlled, HbA1c 11.3.    Hypernatremia -Holding diuresis, encouraged to increase her fluid intake  Normocytic anemia Drop in hemoglobin is noted.  No evidence of overt bleeding.  Could be surgical loss versus acute illness versus hemodilution.   - Patient with known history of B12 deficiency.but B12 level  significantly elevated at 6,256 , will D/C supplement.  Acute renal failure with hypokalemia and hypomagnesemia -creatinine peaked at 1.6,, hold nephrotoxic medications, to keep on the dry side given her COVID-19 , holding lasix due to hypernatremia -  Echocardiogram done in 2018  showed normal systolic function. -Improving  Coronary artery disease Stable.  She also has a history of peripheral vascular disease.  Apparently history of PCI as well.  Had MI in 2015.  Previously on aspirin and Plavix but the last few notes from PCP did not mention Plavix.  Continue only aspirin for now.     History of asthma Currently stable.  Morbid obesity BMI 37.36.  History of active smoking Will need counseling eventually  Hypertension -Pressure uncontrolled, started on amlodipine yesterday, improved, but remains elevated, have increased her dose today.      DVT Prophylaxis: On higher dose of Lovenox per COVID ICU protocol  Lab Results  Component Value Date   PLT 283 01/28/2019     PUD Prophylaxis: Protonix Code Status: Full code Family Communication:  she would like to her husband by herself today . disposition Plan: Will transfer to PCU    Consultants: Pulmonology.  General surgery has been following  Procedures: Incision and debridement of necrotizing soft tissue in the perineum and buttock area on 5/18  Antibiotics: Anti-infectives (From admission, onward)   Start     Dose/Rate Route Frequency Ordered Stop   02/01/19 0800  anidulafungin (ERAXIS) 100 mg in sodium chloride 0.9 % 100 mL IVPB     100 mg 78 mL/hr over 100 Minutes Intravenous Every 24 hours 01/31/19 0729     01/31/19 0800  anidulafungin (ERAXIS) 200 mg in sodium chloride 0.9 % 200 mL IVPB     200 mg 78 mL/hr over 200 Minutes Intravenous  Once 01/31/19 0729 01/31/19 1147   01/29/19 1000  fluconazole (DIFLUCAN) IVPB 200 mg  Status:  Discontinued     200 mg 100 mL/hr over 60 Minutes Intravenous Every 24 hours 01/28/19 0923 01/29/19 0735   01/29/19 1000  fluconazole (DIFLUCAN) IVPB 400 mg  Status:  Discontinued     400 mg 100 mL/hr over 120 Minutes Intravenous Every 24 hours 01/29/19 0735 01/31/19 0729   01/28/19 1000  fluconazole (DIFLUCAN) IVPB 800 mg     800 mg 100 mL/hr over 240 Minutes  Intravenous  Once 01/28/19 0810 01/28/19 1400   01/28/19 0745  fluconazole (DIFLUCAN) IVPB 400 mg  Status:  Discontinued     400 mg 100 mL/hr over 120 Minutes Intravenous Every 24 hours 01/28/19 0740 01/28/19 0810   01/27/19 1430  remdesivir 100 mg in sodium chloride 0.9 % 230 mL IVPB     100 mg over 30 Minutes Intravenous Every 24 hours 01/27/19 1339 02/05/19 1429   01/26/19 2200  meropenem (MERREM) 1 g in sodium chloride 0.9 % 100 mL IVPB     1 g 200 mL/hr over 30 Minutes Intravenous Every 8 hours 01/26/19 1453     01/26/19 1415  remdesivir 100 mg in sodium chloride 0.9 % 250 mL IVPB  Status:  Discontinued     100 mg over 30 Minutes Intravenous Every 24 hours 01/25/19 1421 01/27/19 1340   01/26/19 1400  vancomycin (VANCOCIN) IVPB 750 mg/150 ml premix  Status:  Discontinued     750 mg 150 mL/hr over 60 Minutes Intravenous Every 12 hours 01/26/19 1326 01/27/19 1330   01/25/19 1430  remdesivir 200 mg in sodium chloride 0.9 % 250 mL IVPB  200 mg over 30 Minutes Intravenous Once 01/25/19 1421 01/25/19 1949   01/24/19 2200  vancomycin (VANCOCIN) 1,250 mg in sodium chloride 0.9 % 250 mL IVPB  Status:  Discontinued     1,250 mg 166.7 mL/hr over 90 Minutes Intravenous Every 12 hours 01/24/19 1508 01/26/19 1046   01/23/19 1215  aztreonam (AZACTAM) 2 g in sodium chloride 0.9 % 100 mL IVPB  Status:  Discontinued     2 g 200 mL/hr over 30 Minutes Intravenous To Surgery 01/23/19 1207 01/23/19 1350   01/23/19 1200  vancomycin (VANCOCIN) IVPB 1000 mg/200 mL premix  Status:  Discontinued     1,000 mg 200 mL/hr over 60 Minutes Intravenous Every 12 hours 01/22/19 2359 01/24/19 1508   01/23/19 0800  clindamycin (CLEOCIN) IVPB 900 mg  Status:  Discontinued     900 mg 100 mL/hr over 30 Minutes Intravenous Every 8 hours 01/23/19 0035 01/26/19 1451   01/23/19 0000  clindamycin (CLEOCIN) IVPB 900 mg     900 mg 100 mL/hr over 30 Minutes Intravenous  Once 01/22/19 2350 01/23/19 0101   01/23/19 0000   aztreonam (AZACTAM) 2 g in sodium chloride 0.9 % 100 mL IVPB  Status:  Discontinued     2 g 200 mL/hr over 30 Minutes Intravenous Every 8 hours 01/22/19 2353 01/26/19 1453   01/23/19 0000  vancomycin (VANCOCIN) 1,500 mg in sodium chloride 0.9 % 500 mL IVPB     1,500 mg 250 mL/hr over 120 Minutes Intravenous  Once 01/22/19 2353 01/23/19 1930        Medications:  Scheduled: . amLODipine  5 mg Oral Daily  . aspirin  81 mg Oral Daily  . Chlorhexidine Gluconate Cloth  6 each Topical Daily  . enoxaparin (LOVENOX) injection  0.5 mg/kg Subcutaneous Q12H  . fentaNYL (SUBLIMAZE) injection  100 mcg Intravenous Once  . insulin aspart  0-20 Units Subcutaneous TID WC  . insulin aspart  5 Units Subcutaneous TID WC  . insulin detemir  16 Units Subcutaneous BID  . multivitamin with minerals  1 tablet Oral Daily  . pantoprazole  40 mg Oral Daily  . polyethylene glycol  17 g Per Tube Daily  . rocuronium  80 mg Intravenous Once  . sodium chloride flush  10-40 mL Intracatheter Q12H  . vitamin C  1,000 mg Oral Daily   Continuous: . [START ON 02/01/2019] anidulafungin    . meropenem (MERREM) IV Stopped (01/31/19 6761)  . remdesivir 100 mg in NS 250 mL     PJK:DTOIZTIWPYKDX (TYLENOL) oral liquid 160 mg/5 mL, albuterol, ALPRAZolam, hydrALAZINE, HYDROmorphone (DILAUDID) injection, midazolam, Resource ThickenUp Clear, sodium chloride flush   Objective:  Vital Signs  Vitals:   01/31/19 0930 01/31/19 1100 01/31/19 1150 01/31/19 1156  BP: (!) 177/92     Pulse:  71 (!) 56 65  Resp:  19 15 19   Temp:      TempSrc:      SpO2:  97% 98% 96%  Weight:      Height:        Intake/Output Summary (Last 24 hours) at 01/31/2019 1305 Last data filed at 01/31/2019 1200 Gross per 24 hour  Intake 1237.8 ml  Output 2125 ml  Net -887.2 ml   Filed Weights   01/27/19 0351 01/29/19 0400 01/30/19 0500  Weight: 117.4 kg 108.6 kg 115.5 kg   Awake Alert, Oriented X 3, No new F.N deficits, Normal affect  Symmetrical Chest wall movement, Good air movement bilaterally, CTAB RRR,No Gallops,Rubs or new  Murmurs, No Parasternal Heave +ve B.Sounds, Abd Soft, No tenderness, No rebound - guarding or rigidity. No Cyanosis, Clubbing or edema, No new Rash or bruise      Lab Results:  Data Reviewed: I have personally reviewed following labs and imaging studies  CBC: Recent Labs  Lab 01/25/19 0545  01/27/19 0535 01/28/19 0151 01/29/19 0256 01/30/19 0115 01/31/19 0215  WBC 23.7*   < > 20.7* 20.2* 12.2* 14.8* 18.3*  NEUTROABS 19.4*  --   --   --   --   --   --   HGB 10.9*   < > 8.3* 8.0* 7.9* 8.9* 9.5*  HCT 31.4*   < > 24.3* 23.6* 23.4* 27.2* 30.2*  MCV 79.7*   < > 82.9 84.0 84.2 84.7 85.6  PLT 257   < > 271 283 284 339 336   < > = values in this interval not displayed.    Basic Metabolic Panel: Recent Labs  Lab 01/25/19 0545  01/26/19 0341  01/26/19 1600  01/27/19 0535 01/27/19 1607 01/28/19 0150 01/28/19 0151 01/29/19 0256 01/30/19 0115 01/31/19 0215  NA 137   < > 141   < >  --    < > 142  --   --  144 148* 151* 151*  K 3.2*   < > 3.2*   < >  --    < > 3.2*  --   --  3.9 4.0 3.8 3.1*  CL 103  --  110  --   --   --  112*  --   --  114* 120* 123* 120*  CO2 24  --  24  --   --   --  19*  --   --  22 22 22 25   GLUCOSE 219*  --  77  --   --   --  230*  --   --  267* 285* 231* 102*  BUN <5*  --  11  --   --   --  34*  --   --  51* 54* 63* 49*  CREATININE 0.49  --  1.12*  --   --   --  1.52*  --   --  1.60* 1.30* 1.20* 1.12*  CALCIUM 8.0*  --  7.4*  --   --   --  6.8*  --   --  7.8* 7.9* 8.5* 8.5*  MG 1.4*  --  1.5*  --  1.3*  --  2.0 1.1* 3.1*  --   --   --   --   PHOS 3.3  --   --   --  3.3  --  4.1 2.1*  --   --   --   --   --    < > = values in this interval not displayed.    GFR: Estimated Creatinine Clearance: 83.8 mL/min (A) (by C-G formula based on SCr of 1.12 mg/dL (H)).  Liver Function Tests: Recent Labs  Lab 01/27/19 0535 01/28/19 0151 01/29/19 0256 01/30/19  0115 01/31/19 0215  AST 24 16 39 20 16  ALT 35 30 40 40 31  ALKPHOS 110 100 99 91 89  BILITOT 0.2* 0.3 <0.1* 0.1* 0.2*  PROT 5.5* 5.9* 5.9* 6.2* 5.9*  ALBUMIN 1.7* 1.7* 1.7* 1.9* 1.9*     Coagulation Profile: No results for input(s): INR, PROTIME in the last 168 hours.  Cardiac Enzymes: No results for input(s): CKTOTAL, CKMB, CKMBINDEX, TROPONINI in the last 168 hours.  HbA1C:  No results for input(s): HGBA1C in the last 72 hours.  CBG: Recent Labs  Lab 01/30/19 1540 01/30/19 1749 01/30/19 2228 01/31/19 0750 01/31/19 1127  GLUCAP 106* 81 141* 83 135*    Lipid Profile: Recent Labs    01/29/19 0256 01/30/19 0115  TRIG 167* 158*    Anemia Panel: Recent Labs    01/29/19 0256 01/29/19 0257 01/30/19 0115  VITAMINB12 6,256*  --   --   FOLATE  --  14.7  --   FERRITIN 227  --  190  TIBC 127*  --   --   IRON 33  --   --   RETICCTPCT 0.9  --   --     Recent Results (from the past 240 hour(s))  Culture, blood (routine x 2)     Status: None   Collection Time: 01/22/19  8:40 PM  Result Value Ref Range Status   Specimen Description BLOOD LEFT HAND  Final   Special Requests   Final    BOTTLES DRAWN AEROBIC AND ANAEROBIC Blood Culture adequate volume   Culture   Final    NO GROWTH 5 DAYS Performed at [facility], [place] [address], [place], [place] [address]    Report Status 01/27/2019 FINAL  Final  Culture, blood (routine x 2)     Status: None   Collection Time: 01/22/19  8:58 PM  Result Value Ref Range Status   Specimen Description BLOOD RIGHT ANTECUBITAL  Final   Special Requests   Final    BOTTLES DRAWN AEROBIC ONLY Blood Culture adequate volume   Culture   Final    NO GROWTH 5 DAYS Performed at [facility], [place] [address], [place], [place] [address]    Report Status 01/27/2019 FINAL  Final  SARS Coronavirus 2 (CEPHEID - Performed in [place] hospital lab), Hosp Order     Status: Abnormal   Collection Time: 01/23/19  1:19 AM  Result Value  Ref Range Status   SARS Coronavirus 2 POSITIVE (A) NEGATIVE Final    Comment: RESULT CALLED TO, READ BACK BY AND VERIFIED WITH: R FITZGERAKD ANSTI @0235  01/23/19 BY S GEZAHEGN (NOTE) If result is NEGATIVE SARS-CoV-2 target nucleic acids are NOT DETECTED. The SARS-CoV-2 RNA is generally detectable in upper and lower  respiratory specimens during the acute phase of infection. The lowest  concentration of SARS-CoV-2 viral copies this assay can detect is 250  copies / mL. A negative result does not preclude SARS-CoV-2 infection  and should not be used as the sole basis for treatment or other  patient management decisions.  A negative result may occur with  improper specimen collection / handling, submission of specimen other  than nasopharyngeal swab, presence of viral mutation(s) within the  areas targeted by this assay, and inadequate number of viral copies  (<250 copies / mL). A negative result must be combined with clinical  observations, patient history, and epidemiological information. If result is POSITIVE SARS-CoV-2 target nucleic acids are D ETECTED. The SARS-CoV-2 RNA is generally detectable in upper and lower  respiratory specimens during the acute phase of infection.  Positive  results are indicative of active infection with SARS-CoV-2.  Clinical  correlation with patient history and other diagnostic information is  necessary to determine patient infection status.  Positive results do  not rule out bacterial infection or co-infection with other viruses. If result is PRESUMPTIVE POSTIVE SARS-CoV-2 nucleic acids MAY BE PRESENT.   A presumptive positive result was obtained on  the submitted specimen  and confirmed on repeat testing.  While 2019 novel coronavirus  (SARS-CoV-2) nucleic acids may be present in the submitted sample  additional confirmatory testing may be necessary for epidemiological  and / or clinical management purposes  to differentiate between  SARS-CoV-2 and  other Sarbecovirus currently known to infect humans.  If clinically indicated additional testing with an alternate test  methodology [phone]) is advised. The SARS-CoV-2 RNA is generally  detectable in upper and lower respiratory specimens during the acute  phase of infection. The expected result is Negative. Fact Sheet for Patients:  StrictlyIdeas.no Fact Sheet for Healthcare Providers: BankingDealers.co.za This test is not yet approved or cleared by the [place] FDA and has been authorized for detection and/or diagnosis of SARS-CoV-2 by FDA under an Emergency Use Authorization (EUA).  This EUA will remain in effect (meaning this test can be used) for the duration of the COVID-19 declaration under Section 564(b)(1) of the Act, 21 U.S.C. section 360bbb-3(b)(1), unless the authorization is terminated or revoked sooner. Performed at [facility], [place] [address] [place], [place] [address]   Aerobic/Anaerobic Culture (surgical/deep wound)     Status: None   Collection Time: 01/23/19 12:16 PM  Result Value Ref Range Status   Specimen Description ABSCESS PERINEUM  Final   Special Requests PATIENT ON FOLLOWING CLEOCIN AND AZACTAM  Final   Gram Stain   Final    ABUNDANT WBC PRESENT,BOTH PMN AND MONONUCLEAR ABUNDANT GRAM NEGATIVE RODS MODERATE GRAM VARIABLE ROD FEW GRAM POSITIVE COCCI Performed at [facility], [place] [address] [place], [place] [address]    Culture   Final    FEW STREPTOCOCCUS ANGINOSIS MIXED ANAEROBIC FLORA PRESENT.  CALL LAB IF FURTHER IID REQUIRED.    Report Status 01/26/2019 FINAL  Final   Organism ID, Bacteria STREPTOCOCCUS ANGINOSIS  Final      Susceptibility   Streptococcus anginosis - MIC*    PENICILLIN 0.12 SENSITIVE Sensitive     CEFTRIAXONE 0.5 SENSITIVE Sensitive     ERYTHROMYCIN >=8 RESISTANT Resistant     LEVOFLOXACIN 1 SENSITIVE Sensitive     VANCOMYCIN 0.5 SENSITIVE Sensitive     * FEW  STREPTOCOCCUS ANGINOSIS  Fungus Culture With Stain     Status: Abnormal   Collection Time: 01/23/19 12:41 PM  Result Value Ref Range Status   Fungus Stain Final report  Final   Fungus (Mycology) Culture Preliminary report (A)  Final    Comment: (NOTE) REPORTED POSITIVE RESULT TO LISA PITT AT 7:40 PM 01/27/2019 FAXED TO [phone] Performed At: [facility] [address] [place], [place] [id number] Rush Farmer MD CZ:[id number]    Fungal Source TISSUE  Final    Comment: PERINEUM Performed at [facility], [place] [address], [place], [place] [address]   Aerobic/Anaerobic Culture (surgical/deep wound)     Status: None   Collection Time: 01/23/19 12:41 PM  Result Value Ref Range Status   Specimen Description TISSUE PERINEUM  Final   Special Requests PATIENT ON FOLLOWING CLEOCIN AND AZACTAM  Final   Gram Stain   Final    MODERATE WBC PRESENT,BOTH PMN AND MONONUCLEAR ABUNDANT GRAM NEGATIVE RODS FEW GRAM VARIABLE ROD FEW GRAM POSITIVE COCCI    Culture   Final    RARE STAPHYLOCOCCUS AUREUS RARE STREPTOCOCCUS ANGINOSIS ABUNDANT ANAEROBIC GRAM NEGATIVE ROD BETA LACTAMASE POSITIVE Performed at [facility], [place] [address] [place], [place] [address]    Report Status 01/27/2019 FINAL  Final  Organism ID, Bacteria STREPTOCOCCUS ANGINOSIS  Final   Organism ID, Bacteria STAPHYLOCOCCUS AUREUS  Final      Susceptibility   Staphylococcus aureus - MIC*    CIPROFLOXACIN <=0.5 SENSITIVE Sensitive     ERYTHROMYCIN <=0.25 SENSITIVE Sensitive     GENTAMICIN <=0.5 SENSITIVE Sensitive     OXACILLIN <=0.25 SENSITIVE Sensitive     TETRACYCLINE <=1 SENSITIVE Sensitive     VANCOMYCIN <=0.5 SENSITIVE Sensitive     TRIMETH/SULFA <=10 SENSITIVE Sensitive     CLINDAMYCIN <=0.25 SENSITIVE Sensitive     RIFAMPIN <=0.5 SENSITIVE Sensitive     Inducible Clindamycin NEGATIVE Sensitive     * RARE STAPHYLOCOCCUS AUREUS   Streptococcus anginosis - MIC*    PENICILLIN <=0.06 SENSITIVE  Sensitive     CEFTRIAXONE 0.5 SENSITIVE Sensitive     ERYTHROMYCIN >=8 RESISTANT Resistant     LEVOFLOXACIN 0.5 SENSITIVE Sensitive     VANCOMYCIN 0.5 SENSITIVE Sensitive     * RARE STREPTOCOCCUS ANGINOSIS  Fungus Culture Result     Status: None   Collection Time: 01/23/19 12:41 PM  Result Value Ref Range Status   Result 1 Comment  Final    Comment: (NOTE) KOH/Calcofluor preparation:  no fungus observed. Performed At: [facility] [place], [place] [id number] Rush Farmer MD ZS:[id number]   Fungal organism reflex     Status: Abnormal   Collection Time: 01/23/19 12:41 PM  Result Value Ref Range Status   Fungal result 1 Candida glabrata (A)  Corrected    Comment: (NOTE) Light growth Performed At: [facility] [place], [place] [id number] Rush Farmer MD QR:[id number] CORRECTED ON 05/25 AT 1535: PREVIOUSLY REPORTED AS Comment       Radiology Studies: No results found.     LOS: 8 days   Phillips Climes MD   [facility] Pager on [email or web address]  01/31/2019, 1:05 PM

## 2019-01-31 NOTE — Evaluation (Signed)
Occupational Therapy Treatment Patient Details Name: Maureen Price MRN: [id number] DOB: 1976/08/17 Today's Date: 01/31/2019    History of present illness 43 y.o. female who worked at [employer] with [place] of asthma, poorly controlled type II DM, GERD, depression, CAD S/P PCI, active smoker, obesity; admitted on 01/22/2019, presented with complaint of perineal pain, was found to have Fournier's gangrene.  S/P incision and debridement 01/23/2019.  Her SARS-CoV-2 was positive.  01/25/2019 patient became more hypoxic, unable to maintain adequate saturation on nonrebreather.  She was intubated and then subsequently transferred to the [facility].  ETT 5/20-25.     OT comments  PTA, pt was living alone and working as a Quarry manager at [employer] Pt currently requiring Min A for UB ADLs, Max A for LB ADLs, and Min Guard A for side steps towards HOB with RW. Pt presenting with decreased activity tolerance due to pain at wound, fatigue, and decreased SpO2 dropping to 88% on RA. Pt highly motivated to return home and back to PLOF. Pt would benefit from further acute OT to facilitate safe dc. Recommend dc to home with HHOT for further OT to optimize safety, independence with ADLs, and return to PLOF.   HR 74-68; 97-88% SpO2 on RA; RR 25-16; BP 153/83 and 177/92   Follow Up Recommendations  Home health OT;Supervision/Assistance - 24 hour(May progress to no HH needs)    Equipment Recommendations  3 in 1 bedside commode    Recommendations for Other Services PT consult    Precautions / Restrictions Precautions Precautions: Fall;Other (comment)(foley and flexiseal) Precaution Comments: due to leg weakness, not imbalance Restrictions Weight Bearing Restrictions: No       Mobility Bed Mobility Overal bed mobility: Needs Assistance Bed Mobility: Supine to Sit;Sit to Supine     Supine to sit: Min assist Sit to supine: Min assist   General bed mobility comments: Min A for pushing up into sitting.  Min A for managing BLEs over OEB   Transfers Overall transfer level: Needs assistance Equipment used: Rolling walker (2 wheeled) Transfers: Sit to/from Stand Sit to Stand: Min guard;+2 safety/equipment;From elevated surface         General transfer comment: from air mattress; did not transfer to chair due to no air cushion available for chair    Balance Overall balance assessment: No apparent balance deficits (not formally assessed)(able to stand at EOB without UE support )                                         ADL either performed or assessed with clinical judgement   ADL Overall ADL's : Needs assistance/impaired Eating/Feeding: Set up;Supervision/ safety;Bed level   Grooming: Set up;Supervision/safety;Bed level;Wash/dry face   Upper Body Bathing: Minimal assistance;Sitting;Bed level   Lower Body Bathing: Maximal assistance;Sitting/lateral leans;Bed level   Upper Body Dressing : Minimal assistance;Sitting;Bed level   Lower Body Dressing: Maximal assistance;Bed level Lower Body Dressing Details (indicate cue type and reason): Max A for donning socks as pt has significant pain with bending forward or bringing feet up to ankles.  Toilet Transfer: RW;Min guard(Side steps towards EOB) Armed forces technical officer Details (indicate cue type and reason): Min Guard A for safety Toileting- Clothing Manipulation and Hygiene: Total assistance       Functional mobility during ADLs: Min guard;Rolling walker(sit steps towards EOB) General ADL Comments: Pt presenting with decreased funcitonal performance due to fatigue  and pain at wound in peri area     Vision       Perception     Praxis      Cognition Arousal/Alertness: Awake/alert Behavior During Therapy: [facility] for tasks assessed/performed Overall Cognitive Status: Within Functional Limits for tasks assessed                                 General Comments: Following commands and interacting appropiately  in conversation        Exercises     Shoulder Instructions       General Comments HR 74-68; 97-88% SpO2 on RA; RR 25-16; BP 153/83 and 177/92    Pertinent Vitals/ Pain       Pain Assessment: Faces Faces Pain Scale: Hurts even more Pain Location: Peri area Pain Descriptors / Indicators: Constant;Discomfort;Grimacing Pain Intervention(s): Monitored during session;Limited activity within patient's tolerance;Repositioned  Home Living Family/patient expects to be discharged to:: Private residence Living Arrangements: Alone Available Help at Discharge: Family;Available 24 hours/day Type of Home: Apartment(first floor) Home Access: Level entry     Home Layout: One level     Bathroom Shower/Tub: Tub/shower unit;Curtain   Bathroom Toilet: Standard     Home Equipment: None          Prior Functioning/Environment Level of Independence: Independent        Comments: Works as a Quarry manager at [employer]    Frequency  Min 3X/week        Progress Toward Goals  OT Goals(current goals can now be found in the care plan section)     Acute Rehab OT Goals Patient Stated Goal: be able to return home alone to care for herself OT Goal Formulation: With patient Time For Goal Achievement: 02/14/19 Potential to Achieve Goals: Good  Plan      Co-evaluation                 AM-PAC OT "6 Clicks" Daily Activity     Outcome Measure   Help from another person eating meals?: None Help from another person taking care of personal grooming?: A Little Help from another person toileting, which includes using toliet, bedpan, or urinal?: A Lot Help from another person bathing (including washing, rinsing, drying)?: A Lot Help from another person to put on and taking off regular upper body clothing?: A Little Help from another person to put on and taking off regular lower body clothing?: A Lot 6 Click Score: 16    End of Session Equipment Utilized During Treatment: Rolling  walker  OT Visit Diagnosis: Unsteadiness on feet (R26.81);Other abnormalities of gait and mobility (R26.89);Muscle weakness (generalized) (M62.81);Pain Pain - Right/Left: Right Pain - part of body: (Peri area)   Activity Tolerance Patient tolerated treatment well   Patient Left in bed;with call bell/phone within reach;with nursing/sitter in room   Nurse Communication Mobility status        Time: 7893-8101 OT Time Calculation (min): 31 min  Charges: OT General Charges $OT Visit: 1 Visit OT Evaluation $OT Eval Moderate Complexity: [place], OTR/L Acute Rehab Pager: [phone] Office: [place] 01/31/2019, 12:14 PM

## 2019-02-01 LAB — COMPREHENSIVE METABOLIC PANEL
ALT: 25 U/L (ref 0–44)
AST: 14 U/L — ABNORMAL LOW (ref 15–41)
Albumin: 1.9 g/dL — ABNORMAL LOW (ref 3.5–5.0)
Alkaline Phosphatase: 78 U/L (ref 38–126)
Anion gap: 6 (ref 5–15)
BUN: 34 mg/dL — ABNORMAL HIGH (ref 6–20)
CO2: 25 mmol/L (ref 22–32)
Calcium: 8 mg/dL — ABNORMAL LOW (ref 8.9–10.3)
Chloride: 108 mmol/L (ref 98–111)
Creatinine, Ser: 1 mg/dL (ref 0.44–1.00)
GFR calc Af Amer: >60 mL/min (ref >60)
GFR calc non Af Amer: >60 mL/min (ref >60)
Glucose, Bld: 95 mg/dL (ref 70–99)
Potassium: 3.1 mmol/L — ABNORMAL LOW (ref 3.5–5.1)
Sodium: 139 mmol/L (ref 135–145)
Total Bilirubin: 0.4 mg/dL (ref 0.3–1.2)
Total Protein: 5.6 g/dL — ABNORMAL LOW (ref 6.5–8.1)

## 2019-02-01 LAB — CBC
HCT: 29 % — ABNORMAL LOW (ref 36.0–46.0)
Hemoglobin: 9.9 g/dL — ABNORMAL LOW (ref 12.0–15.0)
MCH: 28.4 pg (ref 26.0–34.0)
MCHC: 34.1 g/dL (ref 30.0–36.0)
MCV: 83.3 fL (ref 80.0–100.0)
Platelets: 390 10*3/uL (ref 150–400)
RBC: 3.48 MIL/uL — ABNORMAL LOW (ref 3.87–5.11)
RDW: 14.5 % (ref 11.5–15.5)
WBC: 17.8 10*3/uL — ABNORMAL HIGH (ref 4.0–10.5)
nRBC: 0 % (ref 0.0–0.2)

## 2019-02-01 LAB — GLUCOSE, CAPILLARY
Glucose-Capillary: 212 mg/dL — ABNORMAL HIGH (ref 70–99)
Glucose-Capillary: 95 mg/dL (ref 70–99)
Glucose-Capillary: 60 mg/dL — ABNORMAL LOW (ref 70–99)
Glucose-Capillary: 69 mg/dL — ABNORMAL LOW (ref 70–99)
Glucose-Capillary: 89 mg/dL (ref 70–99)
Glucose-Capillary: 136 mg/dL — ABNORMAL HIGH (ref 70–99)
Glucose-Capillary: 116 mg/dL — ABNORMAL HIGH (ref 70–99)

## 2019-02-01 LAB — C-REACTIVE PROTEIN: CRP: 2.2 mg/dL — ABNORMAL HIGH (ref <1.0)

## 2019-02-01 MED ORDER — ONDANSETRON HCL 4 MG/2ML IJ SOLN
4.0000 mg | Freq: Four times a day (QID) | INTRAMUSCULAR | Status: DC | PRN
  Administered 2019-02-02 – 2019-02-05 (×6): 4 mg via INTRAVENOUS
  Filled 2019-02-01 (×6): qty 2

## 2019-02-01 MED ORDER — GLUCOSE 40 % PO GEL: 1.0000 | Freq: Once | ORAL | Status: DC | PRN

## 2019-02-01 MED ORDER — POTASSIUM CHLORIDE CRYS ER 20 MEQ PO TBCR
40.0000 meq | EXTENDED_RELEASE_TABLET | Freq: Once | ORAL | Status: AC
  Administered 2019-02-01: 40 meq via ORAL
  Filled 2019-02-01: qty 2

## 2019-02-01 MED ORDER — PROMETHAZINE HCL 25 MG/ML IJ SOLN
12.5000 mg | Freq: Four times a day (QID) | INTRAMUSCULAR | Status: DC | PRN
  Administered 2019-02-03 – 2019-02-05 (×3): 12.5 mg via INTRAVENOUS
  Filled 2019-02-01 (×4): qty 1

## 2019-02-01 MED ORDER — POLYETHYLENE GLYCOL 3350 17 G PO PACK
17.0000 g | PACK | Freq: Every day | ORAL | Status: DC
  Filled 2019-02-01 (×4): qty 1

## 2019-02-01 MED ORDER — PHENOL 1.4 % MT LIQD
1.0000 | OROMUCOSAL | Status: DC | PRN
  Filled 2019-02-01: qty 177

## 2019-02-01 MED ORDER — ENSURE MAX PROTEIN PO LIQD
11.0000 [oz_av] | Freq: Two times a day (BID) | ORAL | Status: DC
  Administered 2019-02-01 – 2019-02-06 (×6): 11 [oz_av] via ORAL
  Filled 2019-02-01 (×12): qty 330

## 2019-02-01 MED ORDER — HYDROMORPHONE HCL 1 MG/ML IJ SOLN
1.0000 mg | INTRAMUSCULAR | Status: DC | PRN
  Administered 2019-02-02 – 2019-02-03 (×5): 2 mg via INTRAVENOUS
  Administered 2019-02-03 – 2019-02-04 (×2): 1 mg via INTRAVENOUS
  Filled 2019-02-01 (×2): qty 1
  Filled 2019-02-01 (×6): qty 2
  Filled 2019-02-01: qty 1

## 2019-02-01 MED ORDER — ACETAMINOPHEN 325 MG PO TABS: 650.0000 mg | ORAL_TABLET | Freq: Four times a day (QID) | ORAL | Status: DC | PRN

## 2019-02-01 MED ORDER — TRAMADOL HCL 50 MG PO TABS
50.0000 mg | ORAL_TABLET | Freq: Four times a day (QID) | ORAL | Status: DC | PRN
  Administered 2019-02-01 – 2019-02-04 (×2): 50 mg via ORAL
  Filled 2019-02-01 (×2): qty 1

## 2019-02-01 NOTE — Progress Notes (Signed)
Patient called her family and updated them using her cellphone. All questions were answered.

## 2019-02-01 NOTE — Progress Notes (Signed)
  Speech Language Pathology Treatment: Dysphagia  Patient Details Name: Maureen Price MRN: [id number] DOB: [birth year] Today's Date: 02/01/2019 Time: 1062-6948 SLP Time Calculation (min) (ACUTE ONLY): 25 min  Assessment / Plan / Recommendation Clinical Impression  Pt reports no difficulties with meals, having just finished a plate of spaghetti and broccoli for lunch. She had an occasional, volitional sounding cough at baseline that she described as being due to a "tickle" in her throat. Also question the potential for globus sensation in the setting of lying flat after eating a meal (including tomato sauce). Pt consumed graham crackers and thin liquids via straw with occasional, persistent report of having a "tickle" and clearing her throat to try to self-manage. Overall presentation is less concerning for aspiration, although precautions were reviewed with pt in light of dysphonic vocal quality. Additional emphasis was placed on sitting upright during and after meals. Will advance diet to regular solids so that pt may be able to get some of the specific foods she has been hoping for (mostly fruit of any kind), also continuing thin liquids. SLP will f/u briefly for tolerance.   HPI HPI: 43 y.o. female with PMH of asthma, poorly controlled type II DM, GERD, depression, CAD S/P PCI, active smoker, obesity; admitted on 01/22/2019, presented with complaint of perineal pain, was found to have Fournier's gangrene.  S/P incision and debridement 01/23/2019.  Her SARS-CoV-2 was positive.  01/25/2019 patient became more hypoxic, unable to maintain adequate saturation on nonrebreather.  She was intubated and then subsequently transferred to the [facility].  ETT 5/20-25.       SLP Plan  Continue with current plan of care       Recommendations  Diet recommendations: Regular;Thin liquid Liquids provided via: Cup;Straw Medication Administration: Whole meds with liquid Supervision: Patient able to  self feed;Intermittent supervision to cue for compensatory strategies Compensations: Slow rate;Small sips/bites Postural Changes and/or Swallow Maneuvers: Seated upright 90 degrees                Oral Care Recommendations: Oral care BID Follow up Recommendations: (tba - none anticipated for swallowing) SLP Visit Diagnosis: Dysphagia, unspecified (R13.10) Plan: Continue with current plan of care       GO                Maureen Price 02/01/2019, 2:27 PM  Pollyann Glen, M.A. [place] Acute Environmental education officer [phone] Office [phone]

## 2019-02-01 NOTE — Progress Notes (Signed)
Hypoglycemic Event  CBG: 69 at 1146  Treatment: Patient drank 4oz of juice  Symptoms: None  Follow-up CBG: Time:1209 CBG Result:60  Treatment: Lunch tray arrived, provided more juice, patient eating.  Follow up CBG: 89  Possible Reasons for Event: Poor intake  Comments/MD notified: No   Fredderick Erb

## 2019-02-01 NOTE — Progress Notes (Signed)
[facility] TEAM 1 - Stepdown/ICU TEAM  Maureen Price  GLO:[id number] DOB: [birth year] DOA: 01/22/2019 PCP: [name], MD    Brief Narrative:  43 year old female with poorly controlled diabetes initially admitted on May 17 for Fournier's gangrene requiring surgical debridement, found to have COVID.  Developed ARDS on May 20 requiring intubation, transferred to [facility] same day.  She works as a Quarry manager at [employer].   Significant Events: 5/17 admitted for Fournier's gangrene 5/18 surgical debridement of gangrenous soft tissue 5/20 worsening hypoxemia, ARDS, intubated, transferred to [facility] 5/25 extubated 5/26 transfer to PCU  COVID-19 specific Treatment: Remdisivir 5/20 > 5/26  Subjective: The patient is sitting up at her bedside.  She complains of a significant persisting sore throat which leads to difficulty when swallowing.  She also complains of nausea.  She denies chest pain shortness of breath or abdominal pain.  She reports that the pain in her wounds is stable.  Assessment & Plan:  Acute hypoxic respiratory failure - COVID-19 PNA required vent support initially - extubated on 5/25 - no Actemra due to her Fournier's - completed 6 day course of Remdisivir - higher dose Lovenox per COVID ICU protocol - CXR noting persistent severe multilobular pneumonia - con to wean O2 to RA as able  Recent Labs    01/30/19 0115 01/31/19 0215 02/01/19 0553  DDIMER 2.17* 3.42*  --   FERRITIN 190  --   --   CRP 7.3* 3.9* 2.2*    Fournier's gangrene with Sepsis  surgical debridement 5/18 - dressing changes wet > dry BID - intraoperative culture growing Streptococcus angiosis and MSSA, negative rods, Candida glabrata - cont broad spectrum abx coverage  DM2 uncontrolled with peripheral vascular disease noncompliant with insulin - A1c 11.3 -CBG well controlled as inpatient  Hypernatremia Corrected with free water expansion/increasing oral intake   Normocytic anemia no evidence of overt bleeding - history of B12 deficiency but B12 level 6,256 -hemoglobin trending upward  Acute renal failure  Now resolved Recent Labs  Lab 01/28/19 0151 01/29/19 0256 01/30/19 0115 01/31/19 0215 02/01/19 0500  CREATININE 1.60* 1.30* 1.20* 1.12* 1.00    Coronary artery disease history of PCI / MI in 2015 - continue aspirin -asymptomatic presently  Asthma Quiescent  Morbid obesity - Body mass index is 41.73 kg/m.  Tobacco abuse  Continue to counsel on absolute need to discontinue tobacco abuse  HTN Blood pressure currently well controlled  DVT prophylaxis: lovenox  Code Status: FULL CODE Family Communication:  Disposition Plan: Transition to MedSurg bed -continue wound care -mobilize  Consultants:  PCCM Gen Surgery   Antimicrobials:  Anidulafungin 5/26 > Meropenem 5/21 >  Objective: Blood pressure (!) 143/73, pulse 65, temperature 97.9 F (36.6 C), temperature source Oral, resp. rate 20, height 5' 5.5" (1.664 m), weight 115.5 kg, SpO2 100 %.  Intake/Output Summary (Last 24 hours) at 02/01/2019 0936 Last data filed at 02/01/2019 0427 Gross per 24 hour  Intake 3237.8 ml  Output 3500 ml  Net -262.2 ml   Filed Weights   01/27/19 0351 01/29/19 0400 01/30/19 0500  Weight: 117.4 kg 108.6 kg 115.5 kg    Examination: General: No acute respiratory distress Lungs: Faint diffuse crackles with no wheezing Cardiovascular: Regular rate and rhythm without murmur gallop or rub normal S1 and S2 Abdomen: Obese, soft, bowel sounds positive, no rebound Extremities: Trace bilateral lower extremity edema  CBC: Recent Labs  Lab 01/30/19 0115 01/31/19 0215 02/01/19 0500  WBC 14.8*  18.3* 17.8*  HGB 8.9* 9.5* 9.9*  HCT 27.2* 30.2* 29.0*  MCV 84.7 85.6 83.3  PLT 339 336 941   Basic Metabolic Panel: Recent Labs  Lab 01/26/19 1600  01/27/19 0535 01/27/19 1607 01/28/19 0150  01/30/19 0115 01/31/19 0215 02/01/19 0500  NA  --     < > 142  --   --    < > 151* 151* 139  K  --    < > 3.2*  --   --    < > 3.8 3.1* 3.1*  CL  --   --  112*  --   --    < > 123* 120* 108  CO2  --   --  19*  --   --    < > 22 25 25   GLUCOSE  --   --  230*  --   --    < > 231* 102* 95  BUN  --   --  34*  --   --    < > 63* 49* 34*  CREATININE  --   --  1.52*  --   --    < > 1.20* 1.12* 1.00  CALCIUM  --   --  6.8*  --   --    < > 8.5* 8.5* 8.0*  MG 1.3*  --  2.0 1.1* 3.1*  --   --   --   --   PHOS 3.3  --  4.1 2.1*  --   --   --   --   --    < > = values in this interval not displayed.   GFR: Estimated Creatinine Clearance: 93.8 mL/min (by C-G formula based on SCr of 1 mg/dL).  Liver Function Tests: Recent Labs  Lab 01/29/19 0256 01/30/19 0115 01/31/19 0215 02/01/19 0500  AST 39 20 16 14*  ALT 40 40 31 25  ALKPHOS 99 91 89 78  BILITOT <0.1* 0.1* 0.2* 0.4  PROT 5.9* 6.2* 5.9* 5.6*  ALBUMIN 1.7* 1.9* 1.9* 1.9*    HbA1C: Hemoglobin A1C  Date/Time Value Ref Range Status  02/28/2018 08:45 AM 10.5 (A) 4.0 - 5.6 % Final  06/23/2017 10:16 AM 11.7  Final   Hgb A1c MFr Bld  Date/Time Value Ref Range Status  01/23/2019 02:36 PM 11.3 (H) 4.8 - 5.6 % Final    Comment:    (NOTE) Pre diabetes:          5.7%-6.4% Diabetes:              >6.4% Glycemic control for   <7.0% adults with diabetes   09/19/2016 03:46 AM 13.1 (H) 4.8 - 5.6 % Final    Comment:    (NOTE)         Pre-diabetes: 5.7 - 6.4         Diabetes: >6.4         Glycemic control for adults with diabetes: <7.0     CBG: Recent Labs  Lab 01/31/19 0750 01/31/19 1127 01/31/19 1644 01/31/19 2110 02/01/19 0754  GLUCAP 83 135* 139* 212* 95    Recent Results (from the past 240 hour(s))  Culture, blood (routine x 2)     Status: None   Collection Time: 01/22/19  8:40 PM  Result Value Ref Range Status   Specimen Description BLOOD LEFT HAND  Final   Special Requests   Final    BOTTLES DRAWN AEROBIC AND ANAEROBIC Blood Culture adequate volume   Culture   Final  NO GROWTH 5 DAYS Performed at [facility], [place] [address], [place], [place] [address]    Report Status 01/27/2019 FINAL  Final  Culture, blood (routine x 2)     Status: None   Collection Time: 01/22/19  8:58 PM  Result Value Ref Range Status   Specimen Description BLOOD RIGHT ANTECUBITAL  Final   Special Requests   Final    BOTTLES DRAWN AEROBIC ONLY Blood Culture adequate volume   Culture   Final    NO GROWTH 5 DAYS Performed at [facility], [place] [address] [place], [place] [address]    Report Status 01/27/2019 FINAL  Final  SARS Coronavirus 2 (CEPHEID - Performed in [place] hospital lab), Hosp Order     Status: Abnormal   Collection Time: 01/23/19  1:19 AM  Result Value Ref Range Status   SARS Coronavirus 2 POSITIVE (A) NEGATIVE Final    Comment: RESULT CALLED TO, READ BACK BY AND VERIFIED WITH: R FITZGERAKD ANSTI @0235  01/23/19 BY S GEZAHEGN (NOTE) If result is NEGATIVE SARS-CoV-2 target nucleic acids are NOT DETECTED. The SARS-CoV-2 RNA is generally detectable in upper and lower  respiratory specimens during the acute phase of infection. The lowest  concentration of SARS-CoV-2 viral copies this assay can detect is 250  copies / mL. A negative result does not preclude SARS-CoV-2 infection  and should not be used as the sole basis for treatment or other  patient management decisions.  A negative result may occur with  improper specimen collection / handling, submission of specimen other  than nasopharyngeal swab, presence of viral mutation(s) within the  areas targeted by this assay, and inadequate number of viral copies  (<250 copies / mL). A negative result must be combined with clinical  observations, patient history, and epidemiological information. If result is POSITIVE SARS-CoV-2 target nucleic acids are D ETECTED. The SARS-CoV-2 RNA is generally detectable in upper and lower  respiratory specimens during the acute phase of infection.  Positive   results are indicative of active infection with SARS-CoV-2.  Clinical  correlation with patient history and other diagnostic information is  necessary to determine patient infection status.  Positive results do  not rule out bacterial infection or co-infection with other viruses. If result is PRESUMPTIVE POSTIVE SARS-CoV-2 nucleic acids MAY BE PRESENT.   A presumptive positive result was obtained on the submitted specimen  and confirmed on repeat testing.  While 2019 novel coronavirus  (SARS-CoV-2) nucleic acids may be present in the submitted sample  additional confirmatory testing may be necessary for epidemiological  and / or clinical management purposes  to differentiate between  SARS-CoV-2 and other Sarbecovirus currently known to infect humans.  If clinically indicated additional testing with an alternate test  methodology [phone]) is advised. The SARS-CoV-2 RNA is generally  detectable in upper and lower respiratory specimens during the acute  phase of infection. The expected result is Negative. Fact Sheet for Patients:  StrictlyIdeas.no Fact Sheet for Healthcare Providers: BankingDealers.co.za This test is not yet approved or cleared by the [place] FDA and has been authorized for detection and/or diagnosis of SARS-CoV-2 by FDA under an Emergency Use Authorization (EUA).  This EUA will remain in effect (meaning this test can be used) for the duration of the COVID-19 declaration under Section 564(b)(1) of the Act, 21 U.S.C. section 360bbb-3(b)(1), unless the authorization is terminated or revoked sooner. Performed at [facility], [place] [address], [place], [place] [address]  Aerobic/Anaerobic Culture (surgical/deep wound)     Status: None   Collection Time: 01/23/19 12:16 PM  Result Value Ref Range Status   Specimen Description ABSCESS PERINEUM  Final   Special Requests PATIENT ON FOLLOWING CLEOCIN AND AZACTAM   Final   Gram Stain   Final    ABUNDANT WBC PRESENT,BOTH PMN AND MONONUCLEAR ABUNDANT GRAM NEGATIVE RODS MODERATE GRAM VARIABLE ROD FEW GRAM POSITIVE COCCI Performed at [facility], [place] [address] [place], [place] [address]    Culture   Final    FEW STREPTOCOCCUS ANGINOSIS MIXED ANAEROBIC FLORA PRESENT.  CALL LAB IF FURTHER IID REQUIRED.    Report Status 01/26/2019 FINAL  Final   Organism ID, Bacteria STREPTOCOCCUS ANGINOSIS  Final      Susceptibility   Streptococcus anginosis - MIC*    PENICILLIN 0.12 SENSITIVE Sensitive     CEFTRIAXONE 0.5 SENSITIVE Sensitive     ERYTHROMYCIN >=8 RESISTANT Resistant     LEVOFLOXACIN 1 SENSITIVE Sensitive     VANCOMYCIN 0.5 SENSITIVE Sensitive     * FEW STREPTOCOCCUS ANGINOSIS  Fungus Culture With Stain     Status: Abnormal   Collection Time: 01/23/19 12:41 PM  Result Value Ref Range Status   Fungus Stain Final report  Final   Fungus (Mycology) Culture Preliminary report (A)  Final    Comment: (NOTE) REPORTED POSITIVE RESULT TO LISA PITT AT 7:40 PM 01/27/2019 FAXED TO [phone] Performed At: [facility] [address] [place], [place] [id number] Rush Farmer MD TS:[id number]    Fungal Source TISSUE  Final    Comment: PERINEUM Performed at [facility], [place] [address], [place], [place] [address]   Aerobic/Anaerobic Culture (surgical/deep wound)     Status: None   Collection Time: 01/23/19 12:41 PM  Result Value Ref Range Status   Specimen Description TISSUE PERINEUM  Final   Special Requests PATIENT ON FOLLOWING CLEOCIN AND AZACTAM  Final   Gram Stain   Final    MODERATE WBC PRESENT,BOTH PMN AND MONONUCLEAR ABUNDANT GRAM NEGATIVE RODS FEW GRAM VARIABLE ROD FEW GRAM POSITIVE COCCI    Culture   Final    RARE STAPHYLOCOCCUS AUREUS RARE STREPTOCOCCUS ANGINOSIS ABUNDANT ANAEROBIC GRAM NEGATIVE ROD BETA LACTAMASE POSITIVE Performed at [facility], [place] [address], [place], [place] [address]    Report  Status 01/27/2019 FINAL  Final   Organism ID, Bacteria STREPTOCOCCUS ANGINOSIS  Final   Organism ID, Bacteria STAPHYLOCOCCUS AUREUS  Final      Susceptibility   Staphylococcus aureus - MIC*    CIPROFLOXACIN <=0.5 SENSITIVE Sensitive     ERYTHROMYCIN <=0.25 SENSITIVE Sensitive     GENTAMICIN <=0.5 SENSITIVE Sensitive     OXACILLIN <=0.25 SENSITIVE Sensitive     TETRACYCLINE <=1 SENSITIVE Sensitive     VANCOMYCIN <=0.5 SENSITIVE Sensitive     TRIMETH/SULFA <=10 SENSITIVE Sensitive     CLINDAMYCIN <=0.25 SENSITIVE Sensitive     RIFAMPIN <=0.5 SENSITIVE Sensitive     Inducible Clindamycin NEGATIVE Sensitive     * RARE STAPHYLOCOCCUS AUREUS   Streptococcus anginosis - MIC*    PENICILLIN <=0.06 SENSITIVE Sensitive     CEFTRIAXONE 0.5 SENSITIVE Sensitive     ERYTHROMYCIN >=8 RESISTANT Resistant     LEVOFLOXACIN 0.5 SENSITIVE Sensitive     VANCOMYCIN 0.5 SENSITIVE Sensitive     * RARE STREPTOCOCCUS ANGINOSIS  Fungus Culture Result     Status: None   Collection Time: 01/23/19 12:41 PM  Result Value Ref Range Status  Result 1 Comment  Final    Comment: (NOTE) KOH/Calcofluor preparation:  no fungus observed. Performed At: [facility] [place], [place] [id number] Rush Farmer MD BP:[id number]   Fungal organism reflex     Status: Abnormal   Collection Time: 01/23/19 12:41 PM  Result Value Ref Range Status   Fungal result 1 Candida glabrata (A)  Corrected    Comment: (NOTE) Light growth Performed At: [facility] [place], [place] [id number] Rush Farmer MD UY:[id number] CORRECTED ON 05/25 AT 1535: PREVIOUSLY REPORTED AS Comment      Scheduled Meds: . amLODipine  10 mg Oral Daily  . aspirin  81 mg Oral Daily  . Chlorhexidine Gluconate Cloth  6 each Topical Daily  . enoxaparin (LOVENOX) injection  0.5 mg/kg Subcutaneous Q12H  . fentaNYL (SUBLIMAZE) injection  100 mcg Intravenous Once  . insulin aspart  0-20 Units Subcutaneous TID  WC  . insulin aspart  5 Units Subcutaneous TID WC  . insulin detemir  16 Units Subcutaneous BID  . multivitamin with minerals  1 tablet Oral Daily  . pantoprazole  40 mg Oral Daily  . polyethylene glycol  17 g Per Tube Daily  . rocuronium  80 mg Intravenous Once  . sodium chloride flush  10-40 mL Intracatheter Q12H  . vitamin C  1,000 mg Oral Daily   Continuous Infusions: . anidulafungin 100 mg (02/01/19 0851)  . meropenem (MERREM) IV 1 g (02/01/19 0554)     LOS: 9 days   Cherene Altes, MD [facility] Office  [phone] Pager - Text Page per Amion  If 7PM-7AM, please contact night-coverage per Amion 02/01/2019, 9:36 AM

## 2019-02-01 NOTE — Progress Notes (Signed)
Occupational Therapy Treatment Patient Details Name: Maureen Price MRN: [id number] DOB: 09-19-1975 Today's Date: 02/01/2019    History of present illness 43 y.o. female who worked at [employer] with Impact of asthma, poorly controlled type II DM, GERD, depression, CAD S/P PCI, active smoker, obesity; admitted on 01/22/2019, presented with complaint of perineal pain, was found to have Fournier's gangrene.  S/P incision and debridement 01/23/2019.  Her SARS-CoV-2 was positive.  01/25/2019 patient became more hypoxic, unable to maintain adequate saturation on nonrebreather.  She was intubated and then subsequently transferred to the [facility].  ETT 5/20-25.     OT comments  Pt progressing towards established OT goals and is highly motivated to participate in therapy. Continue to present with deceased activity tolerance and fatigued after ~10 minutes of standing at sink for grooming. Pt washing her face and brushing her teeth at sink with Min Guard A. Pt performing functional mobility (short distance) with Min Guard A and RW. Continue to recommend dc to home with HHOT and will continue to follow acutely as admitted .   Follow Up Recommendations  Home health OT;Supervision/Assistance - 24 hour(May progress to no HH needs)    Equipment Recommendations  3 in 1 bedside commode    Recommendations for Other Services PT consult    Precautions / Restrictions Precautions Precautions: Fall;Other (comment) Precaution Comments: due to leg weakness, not imbalance Restrictions Weight Bearing Restrictions: No       Mobility Bed Mobility Overal bed mobility: Needs Assistance Bed Mobility: Supine to Sit;Sit to Supine     Supine to sit: Min guard Sit to supine: Min guard   General bed mobility comments: Min Guard A for safety  Transfers Overall transfer level: Needs assistance Equipment used: Rolling walker (2 wheeled) Transfers: Sit to/from Stand Sit to Stand: Min guard          General transfer comment: Min Guard A for safety    Balance Overall balance assessment: No apparent balance deficits (not formally assessed)(able to stand at EOB without UE support )                                         ADL either performed or assessed with clinical judgement   ADL Overall ADL's : Needs assistance/impaired     Grooming: Min guard;Oral care;Wash/dry face;Standing Grooming Details (indicate cue type and reason): Pt performing grooming at sink with Min Guard A for safety. Pt leaning heavily on her elbow for support and energy conservation.  Pt standing at sink for ~10 mins                 Toilet Transfer: Min guard;RW(simulated in room) Toilet Transfer Details (indicate cue type and reason): Min Guard A for safety         Functional mobility during ADLs: Min guard;Rolling walker(sit steps towards EOB) General ADL Comments: Pt performing functional mobility in room to sink to perform grooming     Vision       Perception     Praxis      Cognition Arousal/Alertness: Awake/alert Behavior During Therapy: WFL for tasks assessed/performed Overall Cognitive Status: Within Functional Limits for tasks assessed                                 General Comments: Motivated to participate in  therapy        Exercises     Shoulder Instructions       General Comments Spo2 >90% on RA. RR elevating to 20s during activity. BP stable 133/73.     Pertinent Vitals/ Pain       Pain Assessment: Faces Faces Pain Scale: Hurts a little bit Pain Location: Peri area Pain Descriptors / Indicators: Constant;Discomfort;Grimacing Pain Intervention(s): Monitored during session;Limited activity within patient's tolerance;Repositioned  Home Living                                          Prior Functioning/Environment              Frequency  Min 3X/week        Progress Toward Goals  OT Goals(current goals  can now be found in the care plan section)  Progress towards OT goals: Progressing toward goals  Acute Rehab OT Goals Patient Stated Goal: be able to return home alone to care for herself OT Goal Formulation: With patient Time For Goal Achievement: 02/14/19 Potential to Achieve Goals: Good ADL Goals Pt Will Perform Grooming: with modified independence;standing Pt Will Perform Lower Body Dressing: with set-up;with supervision;sit to/from stand Pt Will Transfer to Toilet: with modified independence;ambulating;bedside commode Pt Will Perform Toileting - Clothing Manipulation and hygiene: with modified independence;sit to/from stand;sitting/lateral leans;with adaptive equipment Pt Will Perform Tub/Shower Transfer: Tub transfer;3 in 1;rolling walker;with min guard assist;ambulating  Plan Discharge plan remains appropriate    Co-evaluation                 AM-PAC OT "6 Clicks" Daily Activity     Outcome Measure   Help from another person eating meals?: None Help from another person taking care of personal grooming?: A Little Help from another person toileting, which includes using toliet, bedpan, or urinal?: A Lot Help from another person bathing (including washing, rinsing, drying)?: A Lot Help from another person to put on and taking off regular upper body clothing?: A Little Help from another person to put on and taking off regular lower body clothing?: A Lot 6 Click Score: 16    End of Session Equipment Utilized During Treatment: Rolling walker  OT Visit Diagnosis: Unsteadiness on feet (R26.81);Other abnormalities of gait and mobility (R26.89);Muscle weakness (generalized) (M62.81);Pain Pain - Right/Left: Right Pain - part of body: (Peri area)   Activity Tolerance Patient tolerated treatment well   Patient Left in bed;with call bell/phone within reach;with nursing/sitter in room   Nurse Communication Mobility status        Time: 4010-2725 OT Time Calculation (min):  33 min  Charges: OT General Charges $OT Visit: 1 Visit OT Treatments $Self Care/Home Management : 23-37 mins  [place], OTR/L Acute Rehab Pager: [phone] Office: [place] 02/01/2019, 5:02 PM

## 2019-02-01 NOTE — Progress Notes (Signed)
PT Cancellation Note  Patient Details Name: BREIONNA PUNT MRN: [id number] DOB: 1976/05/26   Cancelled Treatment:    Reason Eval/Treat Not Completed: Pain limiting ability to participate;Medical issues which prohibited therapy   Patient very nauseated and reporting severe burning pain due to her rectal tube. Requesting her nurse come to assess the tube and agrees she will need pain medicine prior to attempting therapy.  Spoke with pt's nurse, Anderson Malta to relay patient concerns.  Will attempt to see after pain meds (and ?nausea meds, if ordered)   Rexanne Mano, PT 02/01/2019, 11:16 AM

## 2019-02-01 NOTE — Progress Notes (Signed)
Nutrition Follow-up RD working remotely.  DOCUMENTATION CODES:   Obesity unspecified  INTERVENTION:   Ensure Max po BID, each supplement provides 150 kcal and 30 grams of protein.   Pt receiving Hormel Shake daily with Breakfast which provides 520 kcals and 22 g of protein and Magic cup BID with lunch and dinner, each supplement provides 290 kcal and 9 grams of protein, automatically on meal trays to optimize nutritional intake.    NUTRITION DIAGNOSIS:   Increased nutrient needs related to wound healing, acute illness(COVID) as evidenced by estimated needs. Ongoing.   GOAL:   Provide needs based on ASPEN/SCCM guidelines Progressing  MONITOR:   TF tolerance, Skin  REASON FOR ASSESSMENT:   Consult, Ventilator Enteral/tube feeding initiation and management  ASSESSMENT:   Pt with PMH of asthma, poorly controlled type II DM, GERD, depression, CAD, active smoker, obesity admitted 5/17 with sepsis,  Fournier's gangrene s/p I&D 5/18. Post op became hypoxic, COVID test positive. Pt tx to [facility] and intubated.    5/20 developed ARDS due to COVID-19, intubated 5/25 extubated  Plan for hydrotherapy evaluation for wound care% of Breakfast this am.   Medications reviewed and include: SSI, 5 units novolog TID with meals, 16 units levemir BID, MVI, miralax, vitamin C 1000 mg daily   Labs reviewed: K+ 3.1 (L)  UOP: 3500 ml I&O: +11.9 L, mild edema noted Admission weight: 95.2 kg - weight up 20 kg    Lab Results  Component Value Date   HGBA1C 11.3 (H) 01/23/2019     NUTRITION - FOCUSED PHYSICAL EXAM:  Deferred   Diet Order:   Diet Order            DIET SOFT Room service appropriate? Yes; Fluid consistency: Thin  Diet effective now              EDUCATION NEEDS:   No education needs have been identified at this time  Skin:  Skin Assessment: Skin Integrity Issues: Skin Integrity Issues:: Other (Comment) Other: Fournier's gangrene of perienum  Last BM:   5/27  Height:   Ht Readings from Last 1 Encounters:  01/22/19 5' 5.5" (1.664 m)    Weight:   Wt Readings from Last 1 Encounters:  01/30/19 115.5 kg    Ideal Body Weight:  57.9 kg  BMI:  Body mass index is 41.73 kg/m.  Estimated Nutritional Needs:   Kcal:  1900-2100  Protein:  115 - 145 grams  Fluid:  >1.9 L/day  Maylon Peppers RD, LDN, CNSC [phone] Pager [phone] After Hours Pager

## 2019-02-01 NOTE — Plan of Care (Signed)
  Problem: Health Behavior/Discharge Planning: Goal: Ability to manage health-related needs will improve Outcome: Progressing   Problem: Clinical Measurements: Goal: Ability to maintain clinical measurements within normal limits will improve Outcome: Progressing Goal: Will remain free from infection Outcome: Progressing Goal: Diagnostic test results will improve Outcome: Progressing Goal: Respiratory complications will improve Outcome: Progressing Goal: Cardiovascular complication will be avoided Outcome: Progressing   Problem: Activity: Goal: Risk for activity intolerance will decrease Outcome: Progressing   Problem: Nutrition: Goal: Adequate nutrition will be maintained Outcome: Progressing   Problem: Elimination: Goal: Will not experience complications related to bowel motility Outcome: Progressing Goal: Will not experience complications related to urinary retention Outcome: Progressing   Problem: Pain Managment: Goal: General experience of comfort will improve Outcome: Progressing   Problem: Safety: Goal: Ability to remain free from injury will improve Outcome: Progressing   Problem: Skin Integrity: Goal: Risk for impaired skin integrity will decrease Outcome: Progressing   Problem: Coping: Goal: Psychosocial and spiritual needs will be supported Outcome: Progressing   Problem: Respiratory: Goal: Complications related to the disease process, condition or treatment will be avoided or minimized Outcome: Progressing

## 2019-02-02 LAB — COMPREHENSIVE METABOLIC PANEL
ALT: 25 U/L (ref 0–44)
AST: 17 U/L (ref 15–41)
Albumin: 2 g/dL — ABNORMAL LOW (ref 3.5–5.0)
Alkaline Phosphatase: 80 U/L (ref 38–126)
Anion gap: 6 (ref 5–15)
BUN: 22 mg/dL — ABNORMAL HIGH (ref 6–20)
CO2: 27 mmol/L (ref 22–32)
Calcium: 8.1 mg/dL — ABNORMAL LOW (ref 8.9–10.3)
Chloride: 110 mmol/L (ref 98–111)
Creatinine, Ser: 0.98 mg/dL (ref 0.44–1.00)
GFR calc Af Amer: >60 mL/min (ref >60)
GFR calc non Af Amer: >60 mL/min (ref >60)
Glucose, Bld: 109 mg/dL — ABNORMAL HIGH (ref 70–99)
Potassium: 3.5 mmol/L (ref 3.5–5.1)
Sodium: 143 mmol/L (ref 135–145)
Total Bilirubin: 0.6 mg/dL (ref 0.3–1.2)
Total Protein: 5.4 g/dL — ABNORMAL LOW (ref 6.5–8.1)

## 2019-02-02 LAB — MAGNESIUM: Magnesium: 1.7 mg/dL (ref 1.7–2.4)

## 2019-02-02 LAB — GLUCOSE, CAPILLARY
Glucose-Capillary: 105 mg/dL — ABNORMAL HIGH (ref 70–99)
Glucose-Capillary: 86 mg/dL (ref 70–99)
Glucose-Capillary: 144 mg/dL — ABNORMAL HIGH (ref 70–99)
Glucose-Capillary: 83 mg/dL (ref 70–99)
Glucose-Capillary: 119 mg/dL — ABNORMAL HIGH (ref 70–99)

## 2019-02-02 LAB — CBC
HCT: 30.5 % — ABNORMAL LOW (ref 36.0–46.0)
Hemoglobin: 10 g/dL — ABNORMAL LOW (ref 12.0–15.0)
MCH: 27.4 pg (ref 26.0–34.0)
MCHC: 32.8 g/dL (ref 30.0–36.0)
MCV: 83.6 fL (ref 80.0–100.0)
Platelets: 401 10*3/uL — ABNORMAL HIGH (ref 150–400)
RBC: 3.65 MIL/uL — ABNORMAL LOW (ref 3.87–5.11)
RDW: 14.2 % (ref 11.5–15.5)
WBC: 12.9 10*3/uL — ABNORMAL HIGH (ref 4.0–10.5)
nRBC: 0 % (ref 0.0–0.2)

## 2019-02-02 NOTE — Progress Notes (Signed)
  Speech Language Pathology Treatment: Dysphagia  Patient Details Name: Maureen Price MRN: [id number] DOB: Dec 05, 1975 Today's Date: 02/02/2019 Time: 5638-7564 SLP Time Calculation (min) (ACUTE ONLY): 13 min  Assessment / Plan / Recommendation Clinical Impression  Pt doing very well with POs after diet advanced yesterday to regular solids.  Voice remains hoarse and pt c/o persisting sore throat, but improved.  She demonstrates normal mastication, brisk swallow, and no concerns for aspiration with thin liquids.  Pt with normal insight.  No further SLP f/u warranted given improvements and independence with POs.  Pt agrees.   HPI HPI: 43 y.o. female with PMH of asthma, poorly controlled type II DM, GERD, depression, CAD S/P PCI, active smoker, obesity; admitted on 01/22/2019, presented with complaint of perineal pain, was found to have Fournier's gangrene.  S/P incision and debridement 01/23/2019.  Her SARS-CoV-2 was positive.  01/25/2019 patient became more hypoxic, unable to maintain adequate saturation on nonrebreather.  She was intubated and then subsequently transferred to the [facility].  ETT 5/20-25.       SLP Plan  All goals met       Recommendations  Diet recommendations: Regular;Thin liquid Liquids provided via: Cup;Straw Medication Administration: Whole meds with liquid Supervision: Patient able to self feed                Oral Care Recommendations: Oral care BID SLP Visit Diagnosis: Dysphagia, unspecified (R13.10) Plan: All goals met       GO                Maureen Price 02/02/2019, 2:16 PM  Judit Awad L. Tivis Ringer, [place] Office number [phone] Pager [phone]

## 2019-02-02 NOTE — Progress Notes (Signed)
Physical Therapy Treatment Patient Details Name: Maureen Price MRN: [id number] DOB: Jun 01, 1976 Today's Date: 02/02/2019    History of Present Illness 43 y.o. female who worked at [employer] with [place] of asthma, poorly controlled type II DM, GERD, depression, CAD S/P PCI, active smoker, obesity; admitted on 01/22/2019, presented with complaint of perineal pain, was found to have Fournier's gangrene.  S/P incision and debridement 01/23/2019.  Her SARS-CoV-2 was positive.  01/25/2019 patient became more hypoxic, unable to maintain adequate saturation on nonrebreather.  She was intubated and then subsequently transferred to the [facility].  ETT 5/20-25.      PT Comments    Patient very determined to improve her mobility despite 7/10 pain at site of incision. She required frequent standing rest breaks, RW, and 2L O2 to ambulate 80 ft with SaO2 98-100% and HR 78-98. She is most limited by generalized fatigue and decr endurance. Anticipate continued improvement at a faster pace due to her age. Because she lives alone, she may require HHPT and/or RW on discharge.    Follow Up Recommendations  Home health PT(vs none (continue to assess))     Equipment Recommendations  Rolling walker with 5" wheels    Recommendations for Other Services       Precautions / Restrictions Precautions Precautions: Fall Precaution Comments: due to leg weakness, not imbalance Restrictions Weight Bearing Restrictions: No    Mobility  Bed Mobility               General bed mobility comments: up on BSC on arrival  Transfers Overall transfer level: Needs assistance Equipment used: Rolling walker (2 wheeled) Transfers: Sit to/from Stand Sit to Stand: Min guard;From elevated surface         General transfer comment: from [facility]; minguard for safety as pt feels weak while standing for pericare; seated rest before walking in hall  Ambulation/Gait Ambulation/Gait assistance: Min guard Gait Distance  (Feet): 80 Feet Assistive device: Rolling walker (2 wheeled) Gait Pattern/deviations: Step-through pattern;Decreased step length - right;Decreased step length - left     General Gait Details: >3 standing rest breaks, slow pace   Stairs             Wheelchair Mobility    Modified Rankin (Stroke Patients Only)       Balance Overall balance assessment: No apparent balance deficits (not formally assessed)                                          Cognition Arousal/Alertness: Awake/alert Behavior During Therapy: WFL for tasks assessed/performed Overall Cognitive Status: Within Functional Limits for tasks assessed                                        Exercises      General Comments General comments (skin integrity, edema, etc.): SaO2 98-100% on 2L (pt did not want to try room air while wearing mask in hallway). HR 78-98 bpm      Pertinent Vitals/Pain Pain Assessment: 0-10 Pain Score: 3  Pain Location: Peri area Pain Descriptors / Indicators: Discomfort Pain Intervention(s): Repositioned    Home Living                      Prior Function  PT Goals (current goals can now be found in the care plan section) Acute Rehab PT Goals Patient Stated Goal: be able to return home alone to care for herself Time For Goal Achievement: 02/14/19 Progress towards PT goals: Progressing toward goals    Frequency    Min 5X/week      PT Plan Current plan remains appropriate    Co-evaluation              AM-PAC PT "6 Clicks" Mobility   Outcome Measure  Help needed turning from your back to your side while in a flat bed without using bedrails?: None Help needed moving from lying on your back to sitting on the side of a flat bed without using bedrails?: None Help needed moving to and from a bed to a chair (including a wheelchair)?: A Little Help needed standing up from a chair using your arms (e.g., wheelchair  or bedside chair)?: A Little Help needed to walk in hospital room?: A Little Help needed climbing 3-5 steps with a railing? : A Little 6 Click Score: 20    End of Session Equipment Utilized During Treatment: Oxygen Activity Tolerance: Patient limited by fatigue Patient left: in chair;with call bell/phone within reach;Other (comment)(on soft/air cushion) Nurse Communication: Mobility status PT Visit Diagnosis: Other abnormalities of gait and mobility (R26.89);Pain Pain - Right/Left: Right Pain - part of body: (perineum s/p I & D)     Time: 8099-8338 PT Time Calculation (min) (ACUTE ONLY): 47 min  Charges:  $Gait Training: 23-37 mins $Therapeutic Activity: 8-22 mins                        [employer], PT 02/02/2019, 4:10 PM

## 2019-02-02 NOTE — Progress Notes (Signed)
PROGRESS NOTE  Maureen Price  NTZ:[id number] DOB: [birth year] DOA: 01/22/2019 PCP: Maureen Lima, MD   Brief Narrative: 43 year old female with poorly controlled diabetes initially admitted on May 17 for Fournier's gangrene requiring surgical debridement, found to have COVID. Developed ARDS on May 20 requiring intubation, transferred to [facility] same day.  She works as a Quarry manager at [employer].   Significant Events: 5/17 admitted for Fournier's gangrene 5/18 surgical debridement of gangrenous soft tissue 5/20 worsening hypoxemia, ARDS, intubated, transferred to [facility] 5/25extubated 5/26 transfer to PCU  COVID-19 specific Treatment: Remdisivir 5/20 > 5/26  Assessment & Plan: Principal Problem:   Fournier gangrene Active Problems:   Uncontrolled diabetes mellitus type 2 with atherosclerosis of arteries of extremities (HCC)   Obesity   Coronary artery disease   Tobacco use disorder   Asthma   Anxiety   Sepsis ([place])   Hypokalemia   COVID-19 virus infection   Acute on chronic respiratory failure with hypoxia and hypercapnia (HCC)  Acute hypoxic respiratory failure - COVID-19 PNA required vent support initially - extubated on 5/25 - no Actemra due to her Fournier's - completed 6 day course of Remdisivir - higher dose Lovenox per COVID ICU protocol - CXR noting persistent severe multilobular pneumonia - con to wean O2 to RA as able. Mobilize, monitor inflammatory markers.  Fournier's gangrene with Sepsis  surgical debridement 5/18 - dressing changes wet > dry BID - intraoperative culture growing Streptococcus angiosis and MSSA, negative rods (not speciated), Candida glabrata - cont broad spectrum abx coverage. Will discuss with lab to get sensitivities on candida. May narrow to unasyn with plans to narrow to augmentin at discharge, and narrow to fluconazole. Will d/w ID.  DM2 uncontrolled with peripheral vascular disease noncompliant with  insulin - A1c 11.3 -CBG well controlled as inpatient  Hypernatremia Corrected with free water expansion/increasing oral intake  Normocytic anemia no evidence of overt bleeding - history of B12 deficiency but B12 level 6,256 -hemoglobin trending upward  Acute renal failure  Now resolved  Coronary artery disease history of PCI / MI in 2015 - continue aspirin -asymptomatic presently  Asthma Quiescent  Morbid obesity - Body mass index is 41.73 kg/m.  Tobacco abuse  Continue to counsel on absolute need to discontinue tobacco abuse  HTN Blood pressure currently well controlled  DVT prophylaxis: Lovenox Code Status: Full Family Communication: None at bedside Disposition Plan: Continue mobility with plans to discharge home once safe for discharge. Need to wean to room air. s/p remdesivir x7 day.   Consultants:   PCCM  General surgery  Antimicrobials:  s/p remdesivir x7 day, eraxis and merrem  Subjective: Very short of breath, pain still requiring dilaudid with wound dressing changes. No fevers, no cough.  Objective: Vitals:   02/01/19 2030 02/02/19 0036 02/02/19 0419 02/02/19 0430  BP: (!) 159/78 (!) 155/81 (!) 148/76   Pulse: 76 71 81   Resp:  18 (!) 74 (!) 21  Temp:  98.8 F (37.1 C) 99.1 F (37.3 C)   TempSrc:  Oral Oral   SpO2: 100% 100% 94% 95%  Weight:      Height:        Intake/Output Summary (Last 24 hours) at 02/02/2019 1701 Last data filed at 02/02/2019 0653 Gross per 24 hour  Intake 830 ml  Output 2300 ml  Net -1470 ml   Filed Weights   01/27/19 0351 01/29/19 0400 01/30/19 0500  Weight: 117.4 kg 108.6 kg 115.5 kg  Gen: 43 y.o. female in no distress  Pulm: Non-labored tachypnea, very shallow breaths, diminished without wheezing or crackles. Pulls only barely 500cc in IS. CV: Regular rate and rhythm. No murmur, rub, or gallop. No JVD, no pedal edema. GI: Abdomen soft, non-tender, non-distended, with normoactive bowel sounds. No  organomegaly or masses felt. Ext: Warm, no deformities Skin: Wound not examined this morning Neuro: Alert and oriented. No focal neurological deficits. Psych: Judgement and insight appear normal. Mood & affect appropriate.   Data Reviewed: I have personally reviewed following labs and imaging studies  CBC: Recent Labs  Lab 01/29/19 0256 01/30/19 0115 01/31/19 0215 02/01/19 0500 02/02/19 0500  WBC 12.2* 14.8* 18.3* 17.8* 12.9*  HGB 7.9* 8.9* 9.5* 9.9* 10.0*  HCT 23.4* 27.2* 30.2* 29.0* 30.5*  MCV 84.2 84.7 85.6 83.3 83.6  PLT 284 339 336 390 960*   Basic Metabolic Panel: Recent Labs  Lab 01/27/19 0535 01/27/19 1607 01/28/19 0150  01/29/19 0256 01/30/19 0115 01/31/19 0215 02/01/19 0500 02/02/19 0500  NA 142  --   --    < > 148* 151* 151* 139 143  K 3.2*  --   --    < > 4.0 3.8 3.1* 3.1* 3.5  CL 112*  --   --    < > 120* 123* 120* 108 110  CO2 19*  --   --    < > 22 22 25 25 27   GLUCOSE 230*  --   --    < > 285* 231* 102* 95 109*  BUN 34*  --   --    < > 54* 63* 49* 34* 22*  CREATININE 1.52*  --   --    < > 1.30* 1.20* 1.12* 1.00 0.98  CALCIUM 6.8*  --   --    < > 7.9* 8.5* 8.5* 8.0* 8.1*  MG 2.0 1.1* 3.1*  --   --   --   --   --  1.7  PHOS 4.1 2.1*  --   --   --   --   --   --   --    < > = values in this interval not displayed.   GFR: Estimated Creatinine Clearance: 95.7 mL/min (by C-G formula based on SCr of 0.98 mg/dL). Liver Function Tests: Recent Labs  Lab 01/29/19 0256 01/30/19 0115 01/31/19 0215 02/01/19 0500 02/02/19 0500  AST 39 20 16 14* 17  ALT 40 40 31 25 25   ALKPHOS 99 91 89 78 80  BILITOT <0.1* 0.1* 0.2* 0.4 0.6  PROT 5.9* 6.2* 5.9* 5.6* 5.4*  ALBUMIN 1.7* 1.9* 1.9* 1.9* 2.0*   No results for input(s): LIPASE, AMYLASE in the last 168 hours. No results for input(s): AMMONIA in the last 168 hours. Coagulation Profile: No results for input(s): INR, PROTIME in the last 168 hours. Cardiac Enzymes: No results for input(s): CKTOTAL, CKMB,  CKMBINDEX, TROPONINI in the last 168 hours. BNP (last 3 results) No results for input(s): PROBNP in the last 8760 hours. HbA1C: No results for input(s): HGBA1C in the last 72 hours. CBG: Recent Labs  Lab 02/01/19 1723 02/01/19 2141 02/02/19 0648 02/02/19 0834 02/02/19 1252  GLUCAP 136* 116* 105* 86 144*   Lipid Profile: No results for input(s): CHOL, HDL, LDLCALC, TRIG, CHOLHDL, LDLDIRECT in the last 72 hours. Thyroid Function Tests: No results for input(s): TSH, T4TOTAL, FREET4, T3FREE, THYROIDAB in the last 72 hours. Anemia Panel: No results for input(s): VITAMINB12, FOLATE, FERRITIN, TIBC, IRON, RETICCTPCT in the last  72 hours. Urine analysis:    Component Value Date/Time   COLORURINE AMBER (A) 01/26/2019 0341   APPEARANCEUR CLOUDY (A) 01/26/2019 0341   LABSPEC 1.025 01/26/2019 0341   PHURINE 5.0 01/26/2019 0341   GLUCOSEU NEGATIVE 01/26/2019 0341   GLUCOSEU >=1000 (A) 05/20/2016 1347   HGBUR NEGATIVE 01/26/2019 0341   BILIRUBINUR NEGATIVE 01/26/2019 0341   BILIRUBINUR n 06/15/2016 1125   KETONESUR NEGATIVE 01/26/2019 0341   PROTEINUR 100 (A) 01/26/2019 0341   UROBILINOGEN negative 06/15/2016 1125   UROBILINOGEN 0.2 05/20/2016 1347   NITRITE NEGATIVE 01/26/2019 0341   LEUKOCYTESUR TRACE (A) 01/26/2019 0341   No results found for this or any previous visit (from the past 240 hour(s)).    Radiology Studies: No results found.  Scheduled Meds: . amLODipine  10 mg Oral Daily  . aspirin  81 mg Oral Daily  . Chlorhexidine Gluconate Cloth  6 each Topical Daily  . enoxaparin (LOVENOX) injection  0.5 mg/kg Subcutaneous Q12H  . insulin aspart  0-20 Units Subcutaneous TID WC  . insulin detemir  16 Units Subcutaneous BID  . multivitamin with minerals  1 tablet Oral Daily  . pantoprazole  40 mg Oral Daily  . polyethylene glycol  17 g Oral Daily  . Ensure Max Protein  11 oz Oral BID  . sodium chloride flush  10-40 mL Intracatheter Q12H  . vitamin C  1,000 mg Oral Daily    Continuous Infusions: . anidulafungin 100 mg (02/02/19 0907)  . meropenem (MERREM) IV 1 g (02/02/19 1310)     LOS: 10 days   Time spent: 25 minutes.  Patrecia Pour, MD [facility] [email or web address] Password [facility] 02/02/2019, 5:01 PM

## 2019-02-03 LAB — GLUCOSE, CAPILLARY
Glucose-Capillary: 87 mg/dL (ref 70–99)
Glucose-Capillary: 113 mg/dL — ABNORMAL HIGH (ref 70–99)
Glucose-Capillary: 143 mg/dL — ABNORMAL HIGH (ref 70–99)
Glucose-Capillary: 101 mg/dL — ABNORMAL HIGH (ref 70–99)

## 2019-02-03 NOTE — Progress Notes (Signed)
Spoke with Tiffany RN to troubleshoot R PICC line, changed the caps and they were able to flushed and get blood return.

## 2019-02-03 NOTE — Progress Notes (Signed)
Physical Therapy Treatment Patient Details Name: Maureen Price MRN: [id number] DOB: 05/08/1976 Today's Date: 02/03/2019    History of Present Illness 43 y.o. female who worked at [employer] with [place] of asthma, poorly controlled type II DM, GERD, depression, CAD S/P PCI, active smoker, obesity; admitted on 01/22/2019, presented with complaint of perineal pain, was found to have Fournier's gangrene.  S/P incision and debridement 01/23/2019.  Her SARS-CoV-2 was positive.  01/25/2019 patient became more hypoxic, unable to maintain adequate saturation on nonrebreather.  She was intubated and then subsequently transferred to the [facility].  ETT 5/20-25.      PT Comments    Patient determined to improve her activity tolerance despite her somber mood. (Today was the funeral for her "second mom" who passed away while T has been hospitalized). She was able to ambulate slowly with RW and standing rest breaks x 200 ft on room air with SaO2 90-96%. No imbalance and feels legs are weak and feels more secure with use of RW at this time.     Follow Up Recommendations  Home health PT(vs none (continue to assess))     Equipment Recommendations  Rolling walker with 5" wheels    Recommendations for Other Services       Precautions / Restrictions Precautions Precautions: Fall Precaution Comments: due to leg weakness, not imbalance Restrictions Weight Bearing Restrictions: No    Mobility  Bed Mobility                  Transfers Overall transfer level: Needs assistance Equipment used: Rolling walker (2 wheeled) Transfers: Sit to/from Stand Sit to Stand: Modified independent (Device/Increase time)         General transfer comment: from recliner to RW  Ambulation/Gait Ambulation/Gait assistance: Supervision Gait Distance (Feet): 200 Feet Assistive device: Rolling walker (2 wheeled) Gait Pattern/deviations: Step-through pattern;Decreased step length - right;Decreased step  length - left     General Gait Details: 2 standing rest breaks, slow pace   Stairs             Wheelchair Mobility    Modified Rankin (Stroke Patients Only)       Balance Overall balance assessment: No apparent balance deficits (not formally assessed)                                          Cognition Arousal/Alertness: Awake/alert Behavior During Therapy: Flat affect(due to family member funeral today) Overall Cognitive Status: Within Functional Limits for tasks assessed                                        Exercises      General Comments General comments (skin integrity, edema, etc.): SaO2 on room air 90-96%      Pertinent Vitals/Pain Pain Assessment: 0-10 Pain Score: 4  Pain Location: Peri area Pain Descriptors / Indicators: Constant;Discomfort Pain Intervention(s): Limited activity within patient's tolerance;Repositioned    Home Living                      Prior Function            PT Goals (current goals can now be found in the care plan section) Acute Rehab PT Goals Patient Stated Goal: be able to return home alone  to care for herself Time For Goal Achievement: 02/14/19 Progress towards PT goals: Progressing toward goals    Frequency    Min 5X/week      PT Plan Current plan remains appropriate    Co-evaluation              AM-PAC PT "6 Clicks" Mobility   Outcome Measure  Help needed turning from your back to your side while in a flat bed without using bedrails?: None Help needed moving from lying on your back to sitting on the side of a flat bed without using bedrails?: None Help needed moving to and from a bed to a chair (including a wheelchair)?: None Help needed standing up from a chair using your arms (e.g., wheelchair or bedside chair)?: None Help needed to walk in hospital room?: A Little Help needed climbing 3-5 steps with a railing? : A Little 6 Click Score: 22    End of  Session Equipment Utilized During Treatment: Other (comment)(took O2 in case needed, did not use) Activity Tolerance: Patient limited by fatigue Patient left: in chair;with call bell/phone within reach;Other (comment)(on soft/air cushion) Nurse Communication: Mobility status PT Visit Diagnosis: Other abnormalities of gait and mobility (R26.89);Pain Pain - Right/Left: Right Pain - part of body: (perineum s/p I & D)     Time: 1914-7829 PT Time Calculation (min) (ACUTE ONLY): 44 min  Charges:  $Gait Training: 38-52 mins                        [employer], PT 02/03/2019, 5:18 PM

## 2019-02-03 NOTE — TOC Initial Note (Signed)
Transition of Care [facility]) - Initial/Assessment Note    Patient Details  Name: Maureen Price MRN: [id number] Date of Birth: 06-Jun-1976  Transition of Care [facility]) CM/SW Contact:    Midge Minium RN, BSN, NCM-BC, ACM-RN [phone] (working remotely) Phone Number: 02/03/2019, 2:59 PM  Clinical Narrative:                 CM following for dispositional needs. CM contacted the patient via phone to discuss the POC and PT/OT recommendations for HH/DME needs, with patient deferring to discuss at this time. PCP: Dr. [name]; Health Insurance: [employer]. CM team will continue to follow for progression needs.   Expected Discharge Plan: [place] Barriers to Discharge: Continued Medical Work up(COVID+; O2 needs; wean to room air. s/p remdesivir x7 day)   Patient Goals and CMS Choice     Choice offered to / list presented to : NA  Expected Discharge Plan and Services Expected Discharge Plan: [place] In-house Referral: NA Discharge Planning Services: CM Consult Post Acute Care Choice: Durable Medical Equipment, Home Health Living arrangements for the past 2 months: Apartment    Prior Living Arrangements/Services Living arrangements for the past 2 months: Apartment Lives with:: Self                   Activities of Daily Living Home Assistive Devices/Equipment: None ADL Screening (condition at time of admission) Patient's cognitive ability adequate to safely complete daily activities?: Yes Is the patient deaf or have difficulty hearing?: No Does the patient have difficulty seeing, even when wearing glasses/contacts?: No Does the patient have difficulty concentrating, remembering, or making decisions?: No Patient able to express need for assistance with ADLs?: Yes Does the patient have difficulty dressing or bathing?: Yes Independently performs ADLs?: Yes (appropriate for developmental age) Does the patient have difficulty walking or climbing  stairs?: No Weakness of Legs: None Weakness of Arms/Hands: None  Permission Sought/Granted                  Emotional Assessment              Admission diagnosis:  Fournier's gangrene in female [N76.89] COVID-19 virus infection [U07.1] Fournier gangrene [N49.3] Patient Active Problem List   Diagnosis Date Noted  . Acute on chronic respiratory failure with hypoxia and hypercapnia (HCC)   . Fournier gangrene 01/23/2019  . Sepsis ([place]) 01/23/2019  . Chronic diastolic (congestive) heart failure ([place]) 01/23/2019  . Hypokalemia 01/23/2019  . COVID-19 virus infection 01/23/2019  . Anxiety 01/22/2019  . Panic anxiety syndrome 01/05/2019  . Thyromegaly 02/28/2018  . Long-term current use of opiate analgesic 02/28/2018  . Microalbuminuria due to type 2 diabetes mellitus ([place]) 06/24/2017  . Visit for screening mammogram 06/23/2017  . Asthma 09/18/2016  . DDD (degenerative disc disease), lumbar 01/02/2015  . Atherosclerosis of native artery of extremity with intermittent claudication ([place]) 03/05/2014  . Tobacco use disorder 03/05/2014  . Coronary artery disease 11/06/2013  . Dyslipidemia, goal LDL below 70 10/26/2013  . Obesity   . Uncontrolled diabetes mellitus type 2 with atherosclerosis of arteries of extremities ([place]) 07/07/2012  . DJD (degenerative joint disease) of knee 07/07/2012   PCP:  [name], MD Pharmacy:   [facility] [address] [place] - [address] [place] [address] Phone: [phone] Fax: [place], [place]  [place] [address] Phone: [phone] Fax: [phone]     Social Determinants of Health (SDOH) Interventions    Readmission Risk Interventions Readmission Risk Prevention Plan 02/03/2019  Transportation Screening Patient refused  PCP or Specialist Appt within 3-5 Days (No Data)  [place] or  Home Care Consult Patient refused  Social Work Consult for [place] Planning/Counseling Patient refused  Palliative Care Screening Not Applicable  Medication Review Press photographer) Complete  Some recent data might be hidden

## 2019-02-03 NOTE — Plan of Care (Signed)
  Problem: Clinical Measurements: Goal: Ability to maintain clinical measurements within normal limits will improve Outcome: Progressing Goal: Will remain free from infection Outcome: Progressing Goal: Diagnostic test results will improve Outcome: Progressing Goal: Respiratory complications will improve Outcome: Progressing   Problem: Activity: Goal: Risk for activity intolerance will decrease Outcome: Progressing   Problem: Elimination: Goal: Will not experience complications related to urinary retention Outcome: Progressing   Problem: Pain Managment: Goal: General experience of comfort will improve Outcome: Progressing   Problem: Safety: Goal: Ability to remain free from injury will improve Outcome: Progressing   Problem: Skin Integrity: Goal: Risk for impaired skin integrity will decrease Outcome: Progressing   Problem: Respiratory: Goal: Complications related to the disease process, condition or treatment will be avoided or minimized Outcome: Progressing

## 2019-02-03 NOTE — Progress Notes (Signed)
Facetime done with mother and she was updated. Dressing change done and pt was premedicated with dilaudid.

## 2019-02-03 NOTE — Progress Notes (Signed)
OT Treatment Note  Pt making progress toward goals. Issued AE to assist with ADL. Also educated pt on completed level 1 theraband exercises. Will continue to follow acutely.     02/03/19 1800  OT Visit Information  Last OT Received On 02/03/19  Assistance Needed +1  History of Present Illness 43 y.o. female who worked at [employer] with [place] of asthma, poorly controlled type II DM, GERD, depression, CAD S/P PCI, active smoker, obesity; admitted on 01/22/2019, presented with complaint of perineal pain, was found to have Fournier's gangrene.  S/P incision and debridement 01/23/2019.  Her SARS-CoV-2 was positive.  01/25/2019 patient became more hypoxic, unable to maintain adequate saturation on nonrebreather.  She was intubated and then subsequently transferred to the [facility].  ETT 5/20-25.    Precautions  Precautions Fall  Precaution Comments due to leg weakness, not imbalance  Pain Assessment  Pain Assessment Faces  Faces Pain Scale 2  Pain Location Peri area  Pain Descriptors / Indicators Discomfort  Pain Intervention(s) Limited activity within patient's tolerance  Cognition  Arousal/Alertness Awake/alert  Behavior During Therapy Flat affect; close mother figure died while pt intubated and her funeral was today  Overall Cognitive Status Within Functional Limits for tasks assessed  Upper Extremity Assessment  Upper Extremity Assessment Generalized weakness  Lower Extremity Assessment  Lower Extremity Assessment Defer to PT evaluation  ADL  General ADL Comments Educated pt on use of toilet tongs to help with pericare. Pt also issued sock aid and reacher to assist with LB dressing and long handled sponge. Pt unable to reach R foot due to pain. Demosntrated use of AE. Will need further practice and education  Issued level 1 theraband to begin exercises  Issued word search and adult coloring book  Bed Mobility  General bed mobility comments OOB in chair  OT - End of Session   Activity Tolerance Patient tolerated treatment well  Patient left in chair;with call bell/phone within reach  Nurse Communication Mobility status (IV beeping)  OT Assessment/Plan  OT Plan Discharge plan remains appropriate  OT Visit Diagnosis Unsteadiness on feet (R26.81);Other abnormalities of gait and mobility (R26.89);Muscle weakness (generalized) (M62.81);Pain  Pain - Right/Left Right  Pain - part of body  (peri area)  OT Frequency (ACUTE ONLY) Min 3X/week  Follow Up Recommendations Home health OT;Supervision/Assistance - 24 hour  OT Equipment 3 in 1 bedside commode  AM-PAC OT "6 Clicks" Daily Activity Outcome Measure (Version 2)  Help from another person eating meals? 4  Help from another person taking care of personal grooming? 3  Help from another person toileting, which includes using toliet, bedpan, or urinal? 2  Help from another person bathing (including washing, rinsing, drying)? 3  Help from another person to put on and taking off regular upper body clothing? 3  Help from another person to put on and taking off regular lower body clothing? 2  6 Click Score 17  OT Goal Progression  Progress towards OT goals Progressing toward goals  Acute Rehab OT Goals  Patient Stated Goal be able to return home alone to care for herself  OT Goal Formulation With patient  Time For Goal Achievement 02/14/19  Potential to Achieve Goals Good  ADL Goals  Pt Will Perform Grooming with modified independence;standing  Pt Will Perform Lower Body Dressing with set-up;with supervision;sit to/from stand  Pt Will Transfer to Toilet with modified independence;ambulating;bedside commode  Pt Will Perform Toileting - Clothing Manipulation and hygiene with modified independence;sit to/from stand;sitting/lateral  leans;with adaptive equipment  Pt Will Perform Tub/Shower Transfer Tub transfer;3 in 1;rolling walker;with min guard assist;ambulating  OT Time Calculation  OT Start Time (ACUTE ONLY) 1705   OT Stop Time (ACUTE ONLY) 1725  OT Time Calculation (min) 20 min  OT General Charges  $OT Visit 1 Visit  OT Treatments  $Self Care/Home Management  8-22 mins  Maurie Boettcher, OT/L   Acute OT Clinical Specialist [place] Pager [phone] Office [phone]

## 2019-02-03 NOTE — Progress Notes (Addendum)
PROGRESS NOTE  Maureen Price  ZHG:[id number] DOB: 16-Jun-1976 DOA: 01/22/2019 PCP: [name], MD   Brief Narrative: 43 year old female with poorly controlled diabetes initially admitted on May 17 for Fournier's gangrene requiring surgical debridement, found to have COVID. Developed ARDS on May 20 requiring intubation, transferred to [facility] same day.  She works as a Quarry manager at [employer].   Significant Events: 5/17 admitted for Fournier's gangrene 5/18 surgical debridement of gangrenous soft tissue 5/20 worsening hypoxemia, ARDS, intubated, transferred to [facility] 5/25extubated 5/26 transfer to PCU  COVID-19 specific Treatment: Remdisivir 5/20 > 5/26  Assessment & Plan: Principal Problem:   Fournier gangrene Active Problems:   Uncontrolled diabetes mellitus type 2 with atherosclerosis of arteries of extremities (HCC)   Obesity   Coronary artery disease   Tobacco use disorder   Asthma   Anxiety   Sepsis ([place])   Hypokalemia   COVID-19 virus infection   Acute on chronic respiratory failure with hypoxia and hypercapnia (HCC)  Acute hypoxic respiratory failure due to covid-19 pneumonia: Initially required mechanical ventilation, received remdesivir x7 days. No actemra due to gangrene.  - Continue intermediate dose lovenox - Repeat CXR intermittently - Wean oxygen as able - Check inflammatory markers.  - Avoid NSAIDs - Prone if able  Sepsis due to fournier's gangrene with polymicrobial abscess: s/p surgical debridement 5/18. cultures grew C. glabrata, viridans Strep, MSSA, and nonspeciated GNRs.  - Continue BID W > D per general surgery - Continue meropenem and eraxis per ID. Pt w/severe PCN allergy.  - Await susceptibilities with C. glabrata on culture with hopes to convert to po fluconazole.  - Trend CBC, leukocytosis still not resolved.  - Spoke with Dr. Harlow Asa who reviewed photos in the medical record, no new recommendations at  this time.   Uncontrolled T2DM with peripheral vascular disease: Noncompliant with insulin. HbA1c 11.3%.  - CBG well controlled as inpatient  Hypernatremia: Corrected with free water expansion/increasing oral intake  Normocytic anemia: no evidence of overt bleeding - history of B12 deficiency but B12 level 6,256. Hemoglobin trending upward - Monitor intermittently  Acute renal failure: Now resolved  Coronary artery disease: history of PCI / MI in 2015  - continue aspirin  Asthma: Quiescent  Morbid obesity: Body mass index is 41.  - Weight loss recommended long term  Tobacco abuse:  - Cessation counseling provided.   HTN: Blood pressure currently well controlled  DVT prophylaxis: Lovenox Code Status: Full Family Communication: None at bedside Disposition Plan: Continue mobility with plans to discharge home once safe for discharge. Need to wean to room air. s/p remdesivir x7 day. With need for home health RN, would likely need negative covid testing.   Consultants:   PCCM  General surgery  Antimicrobials:  s/p remdesivir x7 day, eraxis and merrem  Subjective: Shortness of breath improved today due to not having gotten up yet. Getting pain control consistently prior to wound care.    Objective: Vitals:   02/03/19 0000 02/03/19 0500 02/03/19 0600 02/03/19 0927  BP: (!) 142/85 (!) 147/74 135/67 135/72  Pulse: 91 85 78   Resp:  20 18   Temp:  99.3 F (37.4 C)    TempSrc:  Oral    SpO2: 90% 95% (!) 88%   Weight:      Height:        Intake/Output Summary (Last 24 hours) at 02/03/2019 1111 Last data filed at 02/03/2019 0850 Gross per 24 hour  Intake 890 ml  Output 4750 ml  Net -3860 ml   Filed Weights   01/27/19 0351 01/29/19 0400 01/30/19 0500  Weight: 117.4 kg 108.6 kg 115.5 kg   Gen: 43 y.o. female in no distress Pulm: Nonlabored but tachypneic. very diminished. CV: Regular rate and rhythm. No murmur, rub, or gallop. No JVD, no dependent edema.  GI: Abdomen soft, non-tender, non-distended, with normoactive bowel sounds.  Ext: Warm, no deformities Skin: Wound not examined today. Neuro: Alert and oriented. No focal neurological deficits. Psych: Judgement and insight appear fair. Mood euthymic & affect congruent. Behavior is appropriate.    Data Reviewed: I have personally reviewed following labs and imaging studies  CBC: Recent Labs  Lab 01/29/19 0256 01/30/19 0115 01/31/19 0215 02/01/19 0500 02/02/19 0500  WBC 12.2* 14.8* 18.3* 17.8* 12.9*  HGB 7.9* 8.9* 9.5* 9.9* 10.0*  HCT 23.4* 27.2* 30.2* 29.0* 30.5*  MCV 84.2 84.7 85.6 83.3 83.6  PLT 284 339 336 390 341*   Basic Metabolic Panel: Recent Labs  Lab 01/27/19 1607 01/28/19 0150  01/29/19 0256 01/30/19 0115 01/31/19 0215 02/01/19 0500 02/02/19 0500  NA  --   --    < > 148* 151* 151* 139 143  K  --   --    < > 4.0 3.8 3.1* 3.1* 3.5  CL  --   --    < > 120* 123* 120* 108 110  CO2  --   --    < > 22 22 25 25 27   GLUCOSE  --   --    < > 285* 231* 102* 95 109*  BUN  --   --    < > 54* 63* 49* 34* 22*  CREATININE  --   --    < > 1.30* 1.20* 1.12* 1.00 0.98  CALCIUM  --   --    < > 7.9* 8.5* 8.5* 8.0* 8.1*  MG 1.1* 3.1*  --   --   --   --   --  1.7  PHOS 2.1*  --   --   --   --   --   --   --    < > = values in this interval not displayed.   GFR: Estimated Creatinine Clearance: 95.7 mL/min (by C-G formula based on SCr of 0.98 mg/dL). Liver Function Tests: Recent Labs  Lab 01/29/19 0256 01/30/19 0115 01/31/19 0215 02/01/19 0500 02/02/19 0500  AST 39 20 16 14* 17  ALT 40 40 31 25 25   ALKPHOS 99 91 89 78 80  BILITOT <0.1* 0.1* 0.2* 0.4 0.6  PROT 5.9* 6.2* 5.9* 5.6* 5.4*  ALBUMIN 1.7* 1.9* 1.9* 1.9* 2.0*   No results for input(s): LIPASE, AMYLASE in the last 168 hours. No results for input(s): AMMONIA in the last 168 hours. Coagulation Profile: No results for input(s): INR, PROTIME in the last 168 hours. Cardiac Enzymes: No results for input(s): CKTOTAL,  CKMB, CKMBINDEX, TROPONINI in the last 168 hours. BNP (last 3 results) No results for input(s): PROBNP in the last 8760 hours. HbA1C: No results for input(s): HGBA1C in the last 72 hours. CBG: Recent Labs  Lab 02/02/19 0834 02/02/19 1252 02/02/19 1707 02/02/19 2036 02/03/19 0830  GLUCAP 86 144* 83 119* 87   Lipid Profile: No results for input(s): CHOL, HDL, LDLCALC, TRIG, CHOLHDL, LDLDIRECT in the last 72 hours. Thyroid Function Tests: No results for input(s): TSH, T4TOTAL, FREET4, T3FREE, THYROIDAB in the last 72 hours. Anemia Panel: No results for input(s): VITAMINB12, FOLATE, FERRITIN, TIBC,  IRON, RETICCTPCT in the last 72 hours. Urine analysis:    Component Value Date/Time   COLORURINE AMBER (A) 01/26/2019 0341   APPEARANCEUR CLOUDY (A) 01/26/2019 0341   LABSPEC 1.025 01/26/2019 0341   PHURINE 5.0 01/26/2019 0341   GLUCOSEU NEGATIVE 01/26/2019 0341   GLUCOSEU >=1000 (A) 05/20/2016 1347   HGBUR NEGATIVE 01/26/2019 0341   BILIRUBINUR NEGATIVE 01/26/2019 0341   BILIRUBINUR n 06/15/2016 1125   KETONESUR NEGATIVE 01/26/2019 0341   PROTEINUR 100 (A) 01/26/2019 0341   UROBILINOGEN negative 06/15/2016 1125   UROBILINOGEN 0.2 05/20/2016 1347   NITRITE NEGATIVE 01/26/2019 0341   LEUKOCYTESUR TRACE (A) 01/26/2019 0341   No results found for this or any previous visit (from the past 240 hour(s)).    Radiology Studies: No results found.  Scheduled Meds: . amLODipine  10 mg Oral Daily  . aspirin  81 mg Oral Daily  . Chlorhexidine Gluconate Cloth  6 each Topical Daily  . enoxaparin (LOVENOX) injection  0.5 mg/kg Subcutaneous Q12H  . insulin aspart  0-20 Units Subcutaneous TID WC  . insulin detemir  16 Units Subcutaneous BID  . multivitamin with minerals  1 tablet Oral Daily  . pantoprazole  40 mg Oral Daily  . polyethylene glycol  17 g Oral Daily  . Ensure Max Protein  11 oz Oral BID  . sodium chloride flush  10-40 mL Intracatheter Q12H  . vitamin C  1,000 mg Oral Daily    Continuous Infusions: . anidulafungin 100 mg (02/03/19 0931)  . meropenem (MERREM) IV 1 g (02/03/19 0533)     LOS: 11 days   Time spent: 25 minutes.  Patrecia Pour, MD [facility] [email or web address] Password [email or web address] 02/03/2019, 11:11 AM

## 2019-02-04 LAB — GLUCOSE, CAPILLARY
Glucose-Capillary: 155 mg/dL — ABNORMAL HIGH (ref 70–99)
Glucose-Capillary: 130 mg/dL — ABNORMAL HIGH (ref 70–99)
Glucose-Capillary: 109 mg/dL — ABNORMAL HIGH (ref 70–99)

## 2019-02-04 NOTE — Progress Notes (Signed)
PROGRESS NOTE  Maureen Price  GMW:[id number] DOB: February 25, 1976 DOA: 01/22/2019 PCP: [name], MD   Brief Narrative: 43 year old female with poorly controlled diabetes initially admitted on May 17 for Fournier's gangrene requiring surgical debridement, found to have COVID. Developed ARDS on May 20 requiring intubation, transferred to [facility] same day.  She works as a Quarry manager at [employer].   Significant Events: 5/17 admitted for Fournier's gangrene 5/18 surgical debridement of gangrenous soft tissue 5/20 worsening hypoxemia, ARDS, intubated, transferred to [facility] 5/25extubated 5/26 transfer to PCU  COVID-19 specific Treatment: Remdisivir 5/20 > 5/26  Assessment & Plan: Principal Problem:   Fournier gangrene Active Problems:   Uncontrolled diabetes mellitus type 2 with atherosclerosis of arteries of extremities (HCC)   Obesity   Coronary artery disease   Tobacco use disorder   Asthma   Anxiety   Sepsis ([place])   Hypokalemia   COVID-19 virus infection   Acute on chronic respiratory failure with hypoxia and hypercapnia (HCC)  Acute hypoxic respiratory failure due to covid-19 pneumonia: Initially required mechanical ventilation, received remdesivir x7 days. No actemra due to gangrene.  - Continue intermediate dose lovenox - Repeat CXR intermittently - Wean oxygen as able - Check inflammatory markers.  - Avoid NSAIDs - Prone if able  Sepsis due to fournier's gangrene with polymicrobial abscess: s/p surgical debridement 5/18. cultures grew C. glabrata, viridans Strep, MSSA, and nonspeciated GNRs.  - Continue BID W > D dressing changes to assist with debridement. Spoke with Dr. Harlow Asa who reviewed photos in the medical record, no new recommendations at this time. per general surgery, can follow up in [place] office in 1-2 weeks post discharge. - Continue meropenem and eraxis per ID. Pt w/severe PCN allergy.  - Await susceptibilities with C.  glabrata on culture with hopes to convert to po fluconazole.  - Trend CBC, leukocytosis still not resolved. Recheck in AM.   Uncontrolled T2DM with peripheral vascular disease: Noncompliant with insulin. HbA1c 11.3%.  - CBG well controlled as inpatient, will order necessary supplies at discharge.  Hypernatremia: Corrected with free water expansion/increasing oral intake  Normocytic anemia: no evidence of overt bleeding - history of B12 deficiency but B12 level 6,256. Hemoglobin trending upward - Monitor intermittently  Acute renal failure: Now resolved  Coronary artery disease: history of PCI / MI in 2015  - continue aspirin  Asthma: Quiescent  Morbid obesity: Body mass index is 41.  - Weight loss recommended long term  Tobacco abuse:  - Cessation counseling provided.   HTN: Blood pressure currently well controlled  DVT prophylaxis: Lovenox Code Status: Full Family Communication: None at bedside Disposition Plan: Weaning from oxygen. With need for home health RN, would likely need negative covid testing. Will check today.  Consultants:   PCCM  General surgery  Antimicrobials:  s/p remdesivir x7 day, eraxis and merrem  Subjective: Poor appetite, changes in taste are limiting po intake. No extreme hyperglycemia. Breathing is stable, denies shortness of breath at rest. Pain is controlled, tolerating dressing changes. No high grade fevers. No N/V/D.  Objective: Vitals:   02/03/19 2150 02/04/19 0245 02/04/19 0554 02/04/19 0730  BP: 129/78 123/66    Pulse: 88 72  75  Resp: 20 17  18   Temp:  99.6 F (37.6 C) 99.8 F (37.7 C) 99.1 F (37.3 C)  TempSrc:  Oral Oral Oral  SpO2: 96% 97%  98%  Weight:      Height:  Intake/Output Summary (Last 24 hours) at 02/04/2019 1117 Last data filed at 02/04/2019 0916 Gross per 24 hour  Intake 2432.02 ml  Output 3100 ml  Net -667.98 ml   Filed Weights   01/27/19 0351 01/29/19 0400 01/30/19 0500  Weight: 117.4 kg  108.6 kg 115.5 kg   Gen: 43 y.o. female in no distress Pulm: Nonlabored breathing room air, clear.. CV: Regular rate and rhythm. No murmur, rub, or gallop. No JVD, no dependent edema. GI: Abdomen soft, non-tender, non-distended, with normoactive bowel sounds.  Ext: Warm, no deformities Skin: See progress note from 5/30 regarding pictures of perineal wound. Wound edges without significant erythema, slough in wound bed without purulence.  Neuro: Alert and oriented. No focal neurological deficits. Psych: Judgement and insight appear fair. Mood somber & affect congruent. Behavior is appropriate.    Data Reviewed: I have personally reviewed following labs and imaging studies  CBC: Recent Labs  Lab 01/29/19 0256 01/30/19 0115 01/31/19 0215 02/01/19 0500 02/02/19 0500  WBC 12.2* 14.8* 18.3* 17.8* 12.9*  HGB 7.9* 8.9* 9.5* 9.9* 10.0*  HCT 23.4* 27.2* 30.2* 29.0* 30.5*  MCV 84.2 84.7 85.6 83.3 83.6  PLT 284 339 336 390 902*   Basic Metabolic Panel: Recent Labs  Lab 01/29/19 0256 01/30/19 0115 01/31/19 0215 02/01/19 0500 02/02/19 0500  NA 148* 151* 151* 139 143  K 4.0 3.8 3.1* 3.1* 3.5  CL 120* 123* 120* 108 110  CO2 22 22 25 25 27   GLUCOSE 285* 231* 102* 95 109*  BUN 54* 63* 49* 34* 22*  CREATININE 1.30* 1.20* 1.12* 1.00 0.98  CALCIUM 7.9* 8.5* 8.5* 8.0* 8.1*  MG  --   --   --   --  1.7   GFR: Estimated Creatinine Clearance: 95.7 mL/min (by C-G formula based on SCr of 0.98 mg/dL). Liver Function Tests: Recent Labs  Lab 01/29/19 0256 01/30/19 0115 01/31/19 0215 02/01/19 0500 02/02/19 0500  AST 39 20 16 14* 17  ALT 40 40 31 25 25   ALKPHOS 99 91 89 78 80  BILITOT <0.1* 0.1* 0.2* 0.4 0.6  PROT 5.9* 6.2* 5.9* 5.6* 5.4*  ALBUMIN 1.7* 1.9* 1.9* 1.9* 2.0*   CBG: Recent Labs  Lab 02/02/19 2036 02/03/19 0830 02/03/19 1146 02/03/19 1650 02/03/19 2233  GLUCAP 119* 87 113* 143* 101*   Urine analysis:    Component Value Date/Time   COLORURINE AMBER (A) 01/26/2019  0341   APPEARANCEUR CLOUDY (A) 01/26/2019 0341   LABSPEC 1.025 01/26/2019 0341   PHURINE 5.0 01/26/2019 0341   GLUCOSEU NEGATIVE 01/26/2019 0341   GLUCOSEU >=1000 (A) 05/20/2016 1347   HGBUR NEGATIVE 01/26/2019 0341   BILIRUBINUR NEGATIVE 01/26/2019 0341   BILIRUBINUR n 06/15/2016 1125   KETONESUR NEGATIVE 01/26/2019 0341   PROTEINUR 100 (A) 01/26/2019 0341   UROBILINOGEN negative 06/15/2016 1125   UROBILINOGEN 0.2 05/20/2016 1347   NITRITE NEGATIVE 01/26/2019 0341   LEUKOCYTESUR TRACE (A) 01/26/2019 0341     Scheduled Meds: . amLODipine  10 mg Oral Daily  . aspirin  81 mg Oral Daily  . Chlorhexidine Gluconate Cloth  6 each Topical Daily  . enoxaparin (LOVENOX) injection  0.5 mg/kg Subcutaneous Q12H  . insulin aspart  0-20 Units Subcutaneous TID WC  . insulin detemir  16 Units Subcutaneous BID  . multivitamin with minerals  1 tablet Oral Daily  . pantoprazole  40 mg Oral Daily  . polyethylene glycol  17 g Oral Daily  . Ensure Max Protein  11 oz Oral BID  .  sodium chloride flush  10-40 mL Intracatheter Q12H  . vitamin C  1,000 mg Oral Daily   Continuous Infusions: . anidulafungin 100 mg (02/04/19 0819)  . meropenem (MERREM) IV 1 g (02/04/19 0623)     LOS: 12 days   Time spent: 25 minutes.  Patrecia Pour, MD [facility] [email or web address] Password [email or web address] 02/04/2019, 11:17 AM

## 2019-02-04 NOTE — Progress Notes (Signed)
Photos taken 02/04/2019 by nursing staff per MD request.

## 2019-02-04 NOTE — Progress Notes (Signed)
Physical Therapy Treatment Patient Details Name: Maureen Price MRN: [id number] DOB: April 22, 1976 Today's Date: 02/04/2019    History of Present Illness 43 y.o. female who worked at [employer] with [place] of asthma, poorly controlled type II DM, GERD, depression, CAD S/P PCI, active smoker, obesity; admitted on 01/22/2019, presented with complaint of perineal pain, was found to have Fournier's gangrene.  S/P incision and debridement 01/23/2019.  Her SARS-CoV-2 was positive.  01/25/2019 patient became more hypoxic, unable to maintain adequate saturation on nonrebreather.  She was intubated and then subsequently transferred to the [facility].  ETT 5/20-25.      PT Comments    The patient is making progress in functional mobility. Increased ambualtion distaNCE WITH 97%SaO2 ON RA.. continue to encourage ambulation several times per day.   Follow Up Recommendations  Home health PT( may not need)     Equipment Recommendations  Rolling walker with 5" wheels    Recommendations for Other Services       Precautions / Restrictions Precautions Precautions: None Precaution Comments: peri wds    Mobility  Bed Mobility Overal bed mobility: Independent                Transfers   Equipment used: Rolling walker (2 wheeled) Transfers: Sit to/from Stand Sit to Stand: Supervision         General transfer comment: from bed and toilet with rail  Ambulation/Gait Ambulation/Gait assistance: Supervision Gait Distance (Feet): 300 Feet Assistive device: Rolling walker (2 wheeled) Gait Pattern/deviations: Step-through pattern     General Gait Details: 2 standing rest breaks, slow pace   Stairs             Wheelchair Mobility    Modified Rankin (Stroke Patients Only)       Balance Overall balance assessment: No apparent balance deficits (not formally assessed)                                          Cognition Arousal/Alertness:  Awake/alert Behavior During Therapy: Flat affect                                          Exercises      General Comments        Pertinent Vitals/Pain Faces Pain Scale: Hurts little more Pain Location: Peri area when straining Pain Descriptors / Indicators: Discomfort;Grimacing;Moaning Pain Intervention(s): Monitored during session    Home Living                      Prior Function            PT Goals (current goals can now be found in the care plan section) Progress towards PT goals: Progressing toward goals    Frequency    Min 5X/week      PT Plan Current plan remains appropriate    Co-evaluation              AM-PAC PT "6 Clicks" Mobility   Outcome Measure  Help needed turning from your back to your side while in a flat bed without using bedrails?: None Help needed moving from lying on your back to sitting on the side of a flat bed without using bedrails?: None Help needed moving to and from a  bed to a chair (including a wheelchair)?: None Help needed standing up from a chair using your arms (e.g., wheelchair or bedside chair)?: None Help needed to walk in hospital room?: A Little Help needed climbing 3-5 steps with a railing? : A Little 6 Click Score: 22    End of Session   Activity Tolerance: Patient tolerated treatment well Patient left: in chair;with call bell/phone within reach;Other (comment) Nurse Communication: Mobility status PT Visit Diagnosis: Other abnormalities of gait and mobility (R26.89);Pain Pain - Right/Left: Right     Time: 1115-1140 PT Time Calculation (min) (ACUTE ONLY): 25 min  Charges:  $Gait Training: 23-37 mins                      Tresa Endo PT Acute Rehabilitation Services Pager [phone] Office [phone]    Maureen Price 02/04/2019, 1:39 PM

## 2019-02-05 LAB — CBC WITH DIFFERENTIAL/PLATELET
Abs Immature Granulocytes: 0.03 10*3/uL (ref 0.00–0.07)
Basophils Absolute: 0 10*3/uL (ref 0.0–0.1)
Basophils Relative: 0 %
Eosinophils Absolute: 0.2 10*3/uL (ref 0.0–0.5)
Eosinophils Relative: 2 %
HCT: 26.9 % — ABNORMAL LOW (ref 36.0–46.0)
Hemoglobin: 8.6 g/dL — ABNORMAL LOW (ref 12.0–15.0)
Immature Granulocytes: 0 %
Lymphocytes Relative: 45 %
Lymphs Abs: 3.4 10*3/uL (ref 0.7–4.0)
MCH: 27.1 pg (ref 26.0–34.0)
MCHC: 32 g/dL (ref 30.0–36.0)
MCV: 84.9 fL (ref 80.0–100.0)
Monocytes Absolute: 0.7 10*3/uL (ref 0.1–1.0)
Monocytes Relative: 9 %
Neutro Abs: 3.4 10*3/uL (ref 1.7–7.7)
Neutrophils Relative %: 44 %
Platelets: 315 10*3/uL (ref 150–400)
RBC: 3.17 MIL/uL — ABNORMAL LOW (ref 3.87–5.11)
RDW: 14.6 % (ref 11.5–15.5)
WBC: 7.8 10*3/uL (ref 4.0–10.5)
nRBC: 0 % (ref 0.0–0.2)

## 2019-02-05 LAB — BASIC METABOLIC PANEL
Anion gap: 7 (ref 5–15)
BUN: 9 mg/dL (ref 6–20)
CO2: 32 mmol/L (ref 22–32)
Calcium: 8 mg/dL — ABNORMAL LOW (ref 8.9–10.3)
Chloride: 100 mmol/L (ref 98–111)
Creatinine, Ser: 0.92 mg/dL (ref 0.44–1.00)
GFR calc Af Amer: >60 mL/min (ref >60)
GFR calc non Af Amer: >60 mL/min (ref >60)
Glucose, Bld: 92 mg/dL (ref 70–99)
Potassium: 3.8 mmol/L (ref 3.5–5.1)
Sodium: 139 mmol/L (ref 135–145)

## 2019-02-05 LAB — GLUCOSE, CAPILLARY
Glucose-Capillary: 109 mg/dL — ABNORMAL HIGH (ref 70–99)
Glucose-Capillary: 108 mg/dL — ABNORMAL HIGH (ref 70–99)
Glucose-Capillary: 58 mg/dL — ABNORMAL LOW (ref 70–99)
Glucose-Capillary: 88 mg/dL (ref 70–99)
Glucose-Capillary: 134 mg/dL — ABNORMAL HIGH (ref 70–99)

## 2019-02-05 LAB — C-REACTIVE PROTEIN: CRP: 1 mg/dL — ABNORMAL HIGH (ref <1.0)

## 2019-02-05 MED ORDER — OXYCODONE HCL 5 MG PO TABS
5.0000 mg | ORAL_TABLET | ORAL | Status: DC | PRN
  Administered 2019-02-05 – 2019-02-06 (×3): 5 mg via ORAL
  Filled 2019-02-05 (×3): qty 1

## 2019-02-05 MED ORDER — FUROSEMIDE 10 MG/ML IJ SOLN
20.0000 mg | Freq: Once | INTRAMUSCULAR | Status: AC
  Administered 2019-02-05: 20 mg via INTRAVENOUS
  Filled 2019-02-05: qty 2

## 2019-02-05 NOTE — Progress Notes (Signed)
Pt cbg 131.  Given supper tray

## 2019-02-05 NOTE — Progress Notes (Signed)
PROGRESS NOTE  Maureen Price  ZWC:[id number] DOB: [birth year] DOA: 01/22/2019 PCP: [name], MD   Brief Narrative: 43 year old female with poorly controlled diabetes initially admitted on May 17 for Fournier's gangrene requiring surgical debridement, found to have COVID. Developed ARDS on May 20 requiring intubation, transferred to [facility] same day.  She works as a Quarry manager at [employer].   Significant Events: 5/17 admitted for Fournier's gangrene 5/18 surgical debridement of gangrenous soft tissue 5/20 worsening hypoxemia, ARDS, intubated, transferred to [facility] 5/25extubated 5/26 transfer to PCU  COVID-19 specific Treatment: Remdisivir 5/20 > 5/26  Assessment & Plan: Principal Problem:   Fournier gangrene Active Problems:   Uncontrolled diabetes mellitus type 2 with atherosclerosis of arteries of extremities (HCC)   Obesity   Coronary artery disease   Tobacco use disorder   Asthma   Anxiety   Sepsis ([place])   Hypokalemia   COVID-19 virus infection   Acute on chronic respiratory failure with hypoxia and hypercapnia (HCC)  Acute hypoxic respiratory failure due to covid-19 pneumonia: Initially required mechanical ventilation, received remdesivir x7 days. No actemra due to gangrene. Hypoxia has resolved. Inflammatory markers have sustained downward trend.  - Continue intermediate dose lovenox - Continue airborne/contact isolation - Avoid NSAIDs  Sepsis due to fournier's gangrene with polymicrobial abscess: s/p surgical debridement 5/18. cultures grew C. glabrata, viridans Strep, MSSA, and nonspeciated GNRs.  - Continue BID W > D dressing changes to assist with debridement. Spoke with Dr. Harlow Asa who reviewed photos in the medical record, no new recommendations at this time. per general surgery, can follow up in [place] office in 1-2 weeks post discharge. - Change dilaudid to oxycodone prior to dressing change to facilitate discharge home. -  Continue meropenem and eraxis per ID. Pt w/severe PCN allergy.  - Still awaiting susceptibilities with C. glabrata on culture with hopes to convert to po fluconazole.  - Trend CBC, leukocytosis is resolved.   Uncontrolled T2DM with peripheral vascular disease: Noncompliant with insulin. HbA1c 11.3%.  - CBG well controlled as inpatient, will order necessary supplies at discharge. Counseled patient on need for tight control to continue wound healing.  Hypernatremia: Corrected with free water expansion/increasing oral intake  Normocytic anemia: no evidence of overt bleeding - history of B12 deficiency but B12 level 6,256. Hemoglobin trending upward - Monitor intermittently, essentially stable.  Acute renal failure: Now resolved  Coronary artery disease: history of PCI / MI in 2015  - Continue aspirin  Asthma: Quiescent  Morbid obesity: Body mass index is 41.  - Weight loss recommended long term  Tobacco abuse:  - Cessation counseling provided.   HTN: Blood pressure currently well controlled  DVT prophylaxis: Lovenox Code Status: Full Family Communication: None at bedside Disposition Plan: With need for home health RN, would likely need negative covid testing which is pending.  Consultants:   PCCM  General surgery  Antimicrobials:  s/p remdesivir x7 day, eraxis and merrem  Subjective: No trouble breathing, eating ok, noticing some LE swelling symmetrically, nontender, slightly better when moving around more. No fevers, pain is controlled.  Objective: Vitals:   02/04/19 2006 02/04/19 2320 02/05/19 0315 02/05/19 0843  BP:  133/66 125/65   Pulse:      Resp: 20 20 20 20   Temp: 99.5 F (37.5 C) 98.7 F (37.1 C) 99.1 F (37.3 C) 98.9 F (37.2 C)  TempSrc: Oral Oral Oral Oral  SpO2:   94%   Weight:      Height:  Intake/Output Summary (Last 24 hours) at 02/05/2019 0921 Last data filed at 02/05/2019 0843 Gross per 24 hour  Intake 1057.98 ml  Output  3025 ml  Net -1967.02 ml   Filed Weights   01/27/19 0351 01/29/19 0400 01/30/19 0500  Weight: 117.4 kg 108.6 kg 115.5 kg   Gen: 43 y.o. female in no distress Pulm: Nonlabored breathing room air. Clear. CV: Regular rate and rhythm. No murmur, rub, or gallop. No JVD, trace symmetric dependent edema. GI: Abdomen soft, non-tender, non-distended, with normoactive bowel sounds.  Ext: Warm, no deformities Skin: No new rashes, lesions or ulcers on visualized skin. Perineum not examined today. See progress note from 5/30 regarding pictures of perineal wound. Wound edges without significant erythema, slough in wound bed without purulence. Neuro: Alert and oriented. No focal neurological deficits. Psych: Judgement and insight appear fair. Mood euthymic & affect congruent. Behavior is appropriate.    Data Reviewed: I have personally reviewed following labs and imaging studies  CBC: Recent Labs  Lab 01/30/19 0115 01/31/19 0215 02/01/19 0500 02/02/19 0500 02/05/19 0500  WBC 14.8* 18.3* 17.8* 12.9* 7.8  NEUTROABS  --   --   --   --  3.4  HGB 8.9* 9.5* 9.9* 10.0* 8.6*  HCT 27.2* 30.2* 29.0* 30.5* 26.9*  MCV 84.7 85.6 83.3 83.6 84.9  PLT 339 336 390 401* 470   Basic Metabolic Panel: Recent Labs  Lab 01/30/19 0115 01/31/19 0215 02/01/19 0500 02/02/19 0500  NA 151* 151* 139 143  K 3.8 3.1* 3.1* 3.5  CL 123* 120* 108 110  CO2 22 25 25 27   GLUCOSE 231* 102* 95 109*  BUN 63* 49* 34* 22*  CREATININE 1.20* 1.12* 1.00 0.98  CALCIUM 8.5* 8.5* 8.0* 8.1*  MG  --   --   --  1.7   GFR: Estimated Creatinine Clearance: 95.7 mL/min (by C-G formula based on SCr of 0.98 mg/dL). Liver Function Tests: Recent Labs  Lab 01/30/19 0115 01/31/19 0215 02/01/19 0500 02/02/19 0500  AST 20 16 14* 17  ALT 40 31 25 25   ALKPHOS 91 89 78 80  BILITOT 0.1* 0.2* 0.4 0.6  PROT 6.2* 5.9* 5.6* 5.4*  ALBUMIN 1.9* 1.9* 1.9* 2.0*   CBG: Recent Labs  Lab 02/03/19 2233 02/04/19 1148 02/04/19 1644 02/04/19  2121 02/05/19 0810  GLUCAP 101* 155* 130* 109* 109*   Urine analysis:    Component Value Date/Time   COLORURINE AMBER (A) 01/26/2019 0341   APPEARANCEUR CLOUDY (A) 01/26/2019 0341   LABSPEC 1.025 01/26/2019 0341   PHURINE 5.0 01/26/2019 0341   GLUCOSEU NEGATIVE 01/26/2019 0341   GLUCOSEU >=1000 (A) 05/20/2016 1347   HGBUR NEGATIVE 01/26/2019 0341   BILIRUBINUR NEGATIVE 01/26/2019 0341   BILIRUBINUR n 06/15/2016 1125   KETONESUR NEGATIVE 01/26/2019 0341   PROTEINUR 100 (A) 01/26/2019 0341   UROBILINOGEN negative 06/15/2016 1125   UROBILINOGEN 0.2 05/20/2016 1347   NITRITE NEGATIVE 01/26/2019 0341   LEUKOCYTESUR TRACE (A) 01/26/2019 0341     Scheduled Meds: . amLODipine  10 mg Oral Daily  . aspirin  81 mg Oral Daily  . Chlorhexidine Gluconate Cloth  6 each Topical Daily  . enoxaparin (LOVENOX) injection  0.5 mg/kg Subcutaneous Q12H  . furosemide  20 mg Intravenous Once  . insulin aspart  0-20 Units Subcutaneous TID WC  . insulin detemir  16 Units Subcutaneous BID  . multivitamin with minerals  1 tablet Oral Daily  . pantoprazole  40 mg Oral Daily  . polyethylene glycol  17  g Oral Daily  . Ensure Max Protein  11 oz Oral BID  . sodium chloride flush  10-40 mL Intracatheter Q12H  . vitamin C  1,000 mg Oral Daily   Continuous Infusions: . anidulafungin 100 mg (02/04/19 0819)  . meropenem (MERREM) IV 1 g (02/05/19 0518)     LOS: 13 days   Time spent: 25 minutes.  Patrecia Pour, MD [facility] [email or web address] Password [email or web address] 02/05/2019, 9:21 AM

## 2019-02-05 NOTE — Progress Notes (Signed)
Wet/dry dressing completed- pt was premedicated with 1mg  dilaudid. Pt updated family via phone

## 2019-02-05 NOTE — TOC Progression Note (Addendum)
Transition of Care [facility]) - Progression Note    Patient Details  Name: Maureen Price MRN: [id number] Date of Birth: 02-04-1976  Transition of [facility]) CM/SW Contact  Loletha Grayer Beverely Pace, RN Phone Number: 02/05/2019, 10:31 AM  Clinical Narrative:   43 yr old female admitted with COVID 19, she is employed at [employer], also had to have surgical debridement for Fournier's gangrene. Was intubated on 01/25/19. Patient has improved thankfully and is preparing to discharge home in a day or so. Case Manager spoke with patient via telephone concerning discharge plan and to offer choice for [place]. Patient says she lives alone but her mom,  and friend will assist her with dressings as needed. Patient was independent prior to this illness. Referral for Home Health was called to Adela Lank, Liaison with [facility]. RW will be delivered to patient's room on day of discharge. Patient asked Case Manager if she knew about her lost wallet and clothing. She says they have not been in her possession since arriving at [facility]. Patient is concerned about her wallet which contains her credit cards, ID etc. Case manager informed patient that she would have her bedside RN contact [facility] and follow appropriate procedure regarding this. Case manager did speak with Network engineer who stated she would update Jenny Reichmann, Therapist, sports.   Expected Discharge Plan: [place] Barriers to Discharge: No Barriers Identified  Expected Discharge Plan and Services Expected Discharge Plan: [place] In-house Referral: NA Discharge Planning Services: CM Consult Post Acute Care Choice: Durable Medical Equipment, Home Health Living arrangements for the past 2 months: Apartment                           HH Arranged: RN, PT [facility] Agency: [place] Date [facility] Agency Contacted: 02/05/19 Time [place]: 1027 Representative spoke with at [place]: Adela Lank   [phone]   Social Determinants of Health (SDOH) Interventions    Readmission Risk Interventions Readmission Risk Prevention Plan 02/03/2019  Transportation Screening Patient refused  PCP or Specialist Appt within 3-5 Days (No Data)  [place] or [place] Patient refused  Social Work Consult for [place] Planning/Counseling Patient refused  Palliative Care Screening Not Applicable  Medication Review Press photographer) Complete  Some recent data might be hidden

## 2019-02-06 ENCOUNTER — Inpatient Hospital Stay: Payer: Self-pay

## 2019-02-06 DIAGNOSIS — R7881 Bacteremia: Secondary | ICD-10-CM | POA: Diagnosis not present

## 2019-02-06 LAB — NOVEL CORONAVIRUS, NAA (HOSP ORDER, SEND-OUT TO REF LAB; TAT 18-24 HRS): SARS-CoV-2, NAA: NOT DETECTED

## 2019-02-06 LAB — GLUCOSE, CAPILLARY
Glucose-Capillary: 79 mg/dL (ref 70–99)
Glucose-Capillary: 131 mg/dL — ABNORMAL HIGH (ref 70–99)
Glucose-Capillary: 84 mg/dL (ref 70–99)
Glucose-Capillary: 83 mg/dL (ref 70–99)

## 2019-02-06 MED ORDER — BLOOD GLUCOSE METER KIT: PACK | 0 refills | Status: DC

## 2019-02-06 MED ORDER — OXYCODONE HCL 5 MG PO TABS: 5.0000 mg | ORAL_TABLET | ORAL | 0 refills | Status: DC | PRN

## 2019-02-06 MED ORDER — MEROPENEM IV (FOR PTA / DISCHARGE USE ONLY): 2.0000 g | Freq: Three times a day (TID) | INTRAVENOUS | 0 refills | Status: DC

## 2019-02-06 MED ORDER — CASPOFUNGIN IV (FOR PTA / DISCHARGE USE ONLY): 50.0000 mg | INTRAVENOUS | 0 refills | Status: DC

## 2019-02-06 NOTE — Plan of Care (Signed)
  Problem: Clinical Measurements: Goal: Ability to maintain clinical measurements within normal limits will improve 02/06/2019 1515 by Joelene Millin, RN Outcome: Completed/Met 02/06/2019 0944 by Joelene Millin, RN Outcome: Progressing Goal: Will remain free from infection 02/06/2019 1515 by Joelene Millin, RN Outcome: Completed/Met 02/06/2019 0944 by Joelene Millin, RN Outcome: Progressing Goal: Diagnostic test results will improve 02/06/2019 1515 by Joelene Millin, RN Outcome: Completed/Met 02/06/2019 0944 by Joelene Millin, RN Outcome: Progressing Goal: Respiratory complications will improve 02/06/2019 1515 by Joelene Millin, RN Outcome: Completed/Met 02/06/2019 0944 by Joelene Millin, RN Outcome: Progressing   Problem: Activity: Goal: Risk for activity intolerance will decrease 02/06/2019 1515 by Joelene Millin, RN Outcome: Completed/Met 02/06/2019 0944 by Joelene Millin, RN Outcome: Progressing   Problem: Elimination: Goal: Will not experience complications related to urinary retention 02/06/2019 1515 by Joelene Millin, RN Outcome: Completed/Met 02/06/2019 0944 by Joelene Millin, RN Outcome: Progressing   Problem: Pain Managment: Goal: General experience of comfort will improve 02/06/2019 1515 by Joelene Millin, RN Outcome: Completed/Met 02/06/2019 0944 by Joelene Millin, RN Outcome: Progressing   Problem: Safety: Goal: Ability to remain free from injury will improve 02/06/2019 1515 by Joelene Millin, RN Outcome: Completed/Met 02/06/2019 0944 by Joelene Millin, RN Outcome: Progressing   Problem: Skin Integrity: Goal: Risk for impaired skin integrity will decrease 02/06/2019 1515 by Joelene Millin, RN Outcome: Completed/Met 02/06/2019 0944 by Joelene Millin, RN Outcome: Progressing   Problem: Respiratory: Goal: Complications related to the disease process, condition  or treatment will be avoided or minimized 02/06/2019 1515 by Joelene Millin, RN Outcome: Completed/Met 02/06/2019 0944 by Joelene Millin, RN Outcome: Progressing

## 2019-02-06 NOTE — Discharge Summary (Signed)
Physician Discharge Summary  Maureen Price XLK:[id number] DOB: 1976/07/08 DOA: 01/22/2019  PCP: [name], MD  Admit date: 01/22/2019 Discharge date: 02/06/2019  Admitted From: Home Disposition: Home   Recommendations for Outpatient Follow-up:  1. Follow up with PCP in 1-2 weeks  Home Health: RN, PT Equipment/Devices: PICC Discharge Condition: Stable CODE STATUS: Full Diet recommendation: Heart healthy, carb-modified  Brief/Interim Summary: 43 year old female with poorly controlled diabetes initiallyadmittedon May 17 for Fournier's gangrene requiring surgical debridement, found to have COVID. Developed ARDS on May 20 requiring intubation, transferred to [facility] same day.  She works as a Quarry manager at [employer].  Significant Events: 5/17 admitted for Fournier's gangrene 5/18 surgical debridement of gangrenous soft tissue 5/20 worsening hypoxemia, ARDS, intubated, transferred to [facility] 5/25extubated 5/26 transfer to PCU  COVID-19 specific Treatment: Remdesivir 5/20 > 5/26  Discharge Diagnoses:  Principal Problem:   Fournier gangrene Active Problems:   Uncontrolled diabetes mellitus type 2 with atherosclerosis of arteries of extremities (HCC)   Obesity   Coronary artery disease   Tobacco use disorder   Asthma   Anxiety   Sepsis ([place])   Hypokalemia   COVID-19 virus infection   Acute on chronic respiratory failure with hypoxia and hypercapnia (HCC)  Acute hypoxic respiratory failure due to covid-19 pneumonia: Initially required mechanical ventilation, received remdesivir x7 days. No actemra due to gangrene. Hypoxia has resolved. Inflammatory markers have sustained downward trend.  - Continue isolation per CDC guidelines - Avoid NSAIDs  Sepsis due to fournier's gangrene with polymicrobial abscess: s/p surgical debridement 5/18. cultures grew C. glabrata, viridans Strep, MSSA, and nonspeciated GNRs. Last febrile 5/20,  leukocytosis has resolved.  - Continue BID W > D dressing changes to assist with debridement. Spoke with Dr. Harlow Asa who reviewed photos in the medical record, no new recommendations at this time. per general surgery, can follow up in [place] office in 1-2 weeks post discharge. - Continue oxycodone prior to dressing changes - Continue meropenem and eraxis per ID. Pt w/severe PCN allergy. Discussed prior to discharge with ID, Dr. Prince Rome. - Still awaiting susceptibilities with C. glabrata on culture, will continue eraxis per ID recommendations.  Uncontrolled T2DM with peripheral vascular disease: Noncompliant with insulin. HbA1c 11.3%.  - CBG well controlled as inpatient, ordered necessary supplies at discharge. Continue insulin at home. Counseled patient on need for tight control to continue wound healing.  Hypernatremia: Corrected with free water and increasing oral intake  Normocytic anemia: no evidence of overt bleeding-history of B12 deficiency but B12 level 6,256. Hemoglobin trending upward - Monitor at follow up, essentially stable.  Acute renal failure: Now resolved  Coronary artery disease: history of PCI/MI in 2015 - Continue aspirin  Asthma: Quiescent  Morbid obesity: Body mass index is 41.  - Weight loss recommended long term  Tobacco abuse:  - Cessation counseling provided.   HTN: Blood pressure currently well controlled  Discharge Instructions Discharge Instructions    Diet - low sodium heart healthy   Complete by:  As directed    Diet Carb Modified   Complete by:  As directed    Discharge instructions   Complete by:  As directed    - Continue taking meropenem and eraxis to treat the infections from the abscess culture for 7 more days.  - You are being discharged from the hospital after treatment for covid-19 infection. You are being discharged based on the standards of national guidelines (body temperature remaining normal for 3 days, symptoms significantly  improved; obvious absorption of inflammation on lung images). - Your test is still pending, unfortunately. - You are felt to be stable enough to no longer require inpatient monitoring, testing, and treatment, though you will need to follow the recommendations below: - Based on the CDC's non-test criteria for ending self-isolation: You may not return to work/leave the home until at least 7 days since symptom onset AND 3 days without a fever (without taking tylenol, ibuprofen, etc.) AND have improvement in respiratory symptoms. - Do not take NSAID medications (including, but not limited to, ibuprofen, advil, motrin, naproxen, aleve, goody's powder, etc.) - Follow up with your doctor in the next week via telehealth or seek medical attention right away if your symptoms get WORSE.  - Consider donating plasma after you have recovered (either 14 days after a negative test or 28 days after symptoms have completely resolved) because your antibodies to this virus may be helpful to give to others with life-threatening infections. Please go to the website www.[email or web address] if you would like to consider volunteering for plasma donation.    Directions for you at home:  Wear a facemask You should wear a facemask that covers your nose and mouth when you are in the same room with other people and when you visit a healthcare provider. People who live with or visit you should also wear a facemask while they are in the same room with you.  Separate yourself from other people in your home As much as possible, you should stay in a different room from other people in your home. Also, you should use a separate bathroom, if available.  Avoid sharing household items You should not share dishes, drinking glasses, cups, eating utensils, towels, bedding, or other items with other people in your home. After using these items, you should wash them thoroughly with soap and water.  Cover your coughs and sneezes Cover your  mouth and nose with a tissue when you cough or sneeze, or you can cough or sneeze into your sleeve. Throw used tissues in a lined trash can, and immediately wash your hands with soap and water for at least 20 seconds or use an alcohol-based hand rub.  Wash your [facility] your hands often and thoroughly with soap and water for at least 20 seconds. You can use an alcohol-based hand sanitizer if soap and water are not available and if your hands are not visibly dirty. Avoid touching your eyes, nose, and mouth with unwashed hands.  Directions for those who live with, or provide care at home for you:  Limit the number of people who have contact with the patient If possible, have only one caregiver for the patient. Other household members should stay in another home or place of residence. If this is not possible, they should stay in another room, or be separated from the patient as much as possible. Use a separate bathroom, if available. Restrict visitors who do not have an essential need to be in the home.  Ensure good ventilation Make sure that shared spaces in the home have good air flow, such as from an air conditioner or an opened window, weather permitting.  Wash your hands often Wash your hands often and thoroughly with soap and water for at least 20 seconds. You can use an alcohol based hand sanitizer if soap and water are not available and if your hands are not visibly dirty. Avoid touching your eyes, nose, and mouth with unwashed hands. Use disposable paper towels to dry  your hands. If not available, use dedicated cloth towels and replace them when they become wet.  Wear a facemask and gloves Wear a disposable facemask at all times in the room and gloves when you touch or have contact with the patient's blood, body fluids, and/or secretions or excretions, such as sweat, saliva, sputum, nasal mucus, vomit, urine, or feces.  Ensure the mask fits over your nose and mouth tightly, and do  not touch it during use. Throw out disposable facemasks and gloves after using them. Do not reuse. Wash your hands immediately after removing your facemask and gloves. If your personal clothing becomes contaminated, carefully remove clothing and launder. Wash your hands after handling contaminated clothing. Place all used disposable facemasks, gloves, and other waste in a lined container before disposing them with other household waste. Remove gloves and wash your hands immediately after handling these items.  Do not share dishes, glasses, or other household items with the patient Avoid sharing household items. You should not share dishes, drinking glasses, cups, eating utensils, towels, bedding, or other items with a patient who is confirmed to have, or being evaluated for, COVID-19 infection. After the person uses these items, you should wash them thoroughly with soap and water.  Wash laundry thoroughly Immediately remove and wash clothes or bedding that have blood, body fluids, and/or secretions or excretions, such as sweat, saliva, sputum, nasal mucus, vomit, urine, or feces, on them. Wear gloves when handling laundry from the patient. Read and follow directions on labels of laundry or clothing items and detergent. In general, wash and dry with the warmest temperatures recommended on the label.  Clean all areas the individual has used often Clean all touchable surfaces, such as counters, tabletops, doorknobs, bathroom fixtures, toilets, phones, keyboards, tablets, and bedside tables, every day. Also, clean any surfaces that may have blood, body fluids, and/or secretions or excretions on them. Wear gloves when cleaning surfaces the patient has come in contact with. Use a diluted bleach solution (e.g., dilute bleach with 1 part bleach and 10 parts water) or a household disinfectant with a label that says EPA-registered for coronaviruses. To make a bleach solution at home, add 1 tablespoon of  bleach to 1 quart (4 cups) of water. For a larger supply, add  cup of bleach to 1 gallon (16 cups) of water. Read labels of cleaning products and follow recommendations provided on product labels. Labels contain instructions for safe and effective use of the cleaning product including precautions you should take when applying the product, such as wearing gloves or eye protection and making sure you have good ventilation during use of the product. Remove gloves and wash hands immediately after cleaning.  Monitor yourself for signs and symptoms of illness Caregivers and household members are considered close contacts, should monitor their health, and will be asked to limit movement outside of the home to the extent possible. Follow the monitoring steps for close contacts listed on the symptom monitoring form.   If you have additional questions, contact your local health department or call the epidemiologist on call at [phone] (available 24/7). This guidance is subject to change. For the most up-to-date guidance from [facility], please refer to their website: [email or web address]   Home infusion instructions [facility] May follow [facility] Dosing Protocol; May administer Cathflo as needed to maintain patency of vascular access device.; Flushing of vascular access device: per [facility] Protocol: 0.9% NaCl pre/post medica...   Complete by:  As directed    Instructions:  May follow [place] Dosing Protocol   Instructions:  May administer Cathflo as needed to maintain patency of vascular access device.   Instructions:  Flushing of vascular access device: per [facility] Protocol: 0.9% NaCl pre/post medication administration and prn patency; Heparin 100 u/ml, 56m for implanted ports and Heparin 10u/ml, 537mfor all other central venous catheters.   Instructions:  May follow [facility] Anaphylaxis Protocol for First Dose Administration in the home: 0.9% NaCl at  25-50 ml/hr to maintain IV access for protocol meds. Epinephrine 0.3 ml IV/IM PRN and Benadryl 25-50 IV/IM PRN s/s of anaphylaxis.   Instructions:  AdBuhlnfusion Coordinator (RN) to assist per patient IV care needs in the home PRN.   Increase activity slowly   Complete by:  As directed      Allergies as of 02/06/2019      Reactions   Erythromycin Anaphylaxis   Penicillins Anaphylaxis   Tolerates meropenem   Metformin And Related Other (See Comments)   Diarrhea, upset stomach   Viibryd [vilazodone Hcl] Other (See Comments)   Altered mental status.     Bee Venom Swelling   Throat swells      Medication List    TAKE these medications   ALPRAZolam 0.5 MG tablet Commonly known as:  XANAX Take 1 tablet (0.5 mg total) by mouth 2 (two) times daily as needed for anxiety.   aspirin EC 81 MG tablet Take 1 tablet (81 mg total) by mouth daily.   blood glucose meter kit and supplies Dispense based on patient and insurance preference. Use up to four times daily as directed. (FOR ICD-10 E10.9, E11.9).   caspofungin  IVPB Commonly known as:  CANCIDAS Inject 50 mg into the vein daily. Indication: Fournier's gangrene Last Day of Therapy:  02/13/19  Labs - Once weekly:  CBC/D and BMP Labs - Every other week:  ESR and CRP   dapagliflozin propanediol 10 MG Tabs tablet Commonly known as:  Farxiga Take 10 mg by mouth daily.   Exenatide ER 2 MG/0.85ML Auij Commonly known as:  Bydureon BCise Inject 1 Act into the skin once a week.   FISH OIL PO Take 1 tablet by mouth daily.   Insulin Degludec 200 UNIT/ML Sopn Commonly known as:  TrAntigua and BarbudalexTouch Inject 50 Units into the skin daily.   meropenem  IVPB Commonly known as:  MERREM Inject 2 g into the vein every 8 (eight) hours. Indication:  Fournier's gangrene  Last Day of Therapy:  02/13/19 Labs - Once weekly:  CBC/D and BMP Labs - Every other week:  ESR and CRP   MULTI-VITAMIN DAILY PO Take 1 tablet by mouth daily.    nitroGLYCERIN 0.4 MG SL tablet Commonly known as:  NITROSTAT Place 1 tablet (0.4 mg total) under the tongue every 5 (five) minutes as needed for chest pain (Hold for SBP <100).   oxyCODONE 5 MG immediate release tablet Commonly known as:  Oxy IR/ROXICODONE Take 1 tablet (5 mg total) by mouth every 4 (four) hours as needed for up to 5 days for severe pain (before dressing changes).   Potassium 75 MG Tabs Take 1 tablet by mouth daily.   vitamin B-12 1000 MCG tablet Commonly known as:  CYANOCOBALAMIN Take 2,000 mcg by mouth daily.   vitamin C 500 MG tablet Commonly known as:  ASCORBIC ACID Take 1,000 mg by mouth daily.            Home Infusion Instuctions  (From admission, onward)  Start     Ordered   02/06/19 0000  Home infusion instructions [facility] May follow [facility] Dosing Protocol; May administer Cathflo as needed to maintain patency of vascular access device.; Flushing of vascular access device: per [facility] Protocol: 0.9% NaCl pre/post medica...    Question Answer Comment  Instructions May follow [place] Dosing Protocol   Instructions May administer Cathflo as needed to maintain patency of vascular access device.   Instructions Flushing of vascular access device: per [facility] Protocol: 0.9% NaCl pre/post medication administration and prn patency; Heparin 100 u/ml, 9m for implanted ports and Heparin 10u/ml, 550mfor all other central venous catheters.   Instructions May follow [facility] Anaphylaxis Protocol for First Dose Administration in the home: 0.9% NaCl at 25-50 ml/hr to maintain IV access for protocol meds. Epinephrine 0.3 ml IV/IM PRN and Benadryl 25-50 IV/IM PRN s/s of anaphylaxis.   Instructions [facility] Infusion Coordinator (RN) to assist per patient IV care needs in the home PRN.      02/06/19 14[facility]. Schedule an appointment as soon as possible for a visit in 1 week(s).    Specialty:  General Surgery Contact information: [email or web address].   Specialty:  Home Health Services Why:  Home Health Registered Nurse, Physical Therapy. A [facility] contact you to arrange start date and time for your services. Contact information: [phone]        [facility] Follow up.   Why:  Home infusion Contact information: [address] [phone]         JoJanith LimaMD. Schedule an appointment as soon as possible for a visit in 1 week(s).   Specialty:  Internal Medicine Contact information: [phone]        Allergies  Allergen Reactions  . Erythromycin Anaphylaxis  . Penicillins Anaphylaxis    Tolerates meropenem  . Metformin And Related Other (See Comments)    Diarrhea, upset stomach  . Viibryd [Vilazodone Hcl] Other (See Comments)    Altered mental status.    . Bee Venom Swelling    Throat swells    Consultations:  PCCM, ID  Procedures/Studies: Dg Abd 1 View  Result Date: 01/27/2019 CLINICAL DATA:  Orogastric tube placement. EXAM: ABDOMEN - 1 VIEW COMPARISON:  CT abdomen pelvis dated Jan 22, 2019. FINDINGS: Enteric tube tip in the distal stomach near the pylorus. There are several borderline dilated loops of air-filled small bowel in the central and left abdomen. No radio-opaque calculi or other significant radiographic abnormality are seen. No acute osseous abnormality. IMPRESSION: 1. Enteric tube tip near the pylorus. 2. Several borderline dilated loops of air-filled small bowel are nonspecific and could reflect developing ileus. Electronically Signed   By: WiTitus Dubin.D.   On: 01/27/2019 10:56   Ct Abdomen Pelvis W Contrast  Result Date:  01/22/2019 CLINICAL DATA:  Perirectal pain and swelling EXAM: CT ABDOMEN AND PELVIS WITH CONTRAST TECHNIQUE: Multidetector CT imaging of the abdomen and pelvis was performed using the standard protocol following bolus administration of intravenous contrast. CONTRAST:  10037mMNIPAQUE IOHEXOL 300 MG/ML  SOLN COMPARISON:  None. FINDINGS: Lower chest: No  acute abnormality. Hepatobiliary: No focal liver abnormality is seen. Status post cholecystectomy. No biliary dilatation. Pancreas: Unremarkable. No pancreatic ductal dilatation or surrounding inflammatory changes. Spleen: Normal in size without focal abnormality. Adrenals/Urinary Tract: Adrenal glands are unremarkable. Kidneys are normal, without renal calculi, focal lesion, or hydronephrosis. Bladder is unremarkable. Stomach/Bowel: Stomach is within normal limits. Appendix appears normal. No evidence of bowel wall thickening, distention, or inflammatory changes. Vascular/Lymphatic: Calcific atherosclerosis. No enlarged abdominal or pelvic lymph nodes. Reproductive: No mass or other abnormality. Other: No abdominal wall hernia or abnormality. No abdominopelvic ascites. There is subcutaneous emphysema in the soft tissues about the right aspect of the anus, perineum, and posterior right vulva (series 12, image 5, series 14, image 73). No discrete fluid collections appreciated. Musculoskeletal: No acute or significant osseous findings. IMPRESSION: There is subcutaneous emphysema in the soft tissues about the right aspect of the anus, perineum, and posterior right vulva (series 12, image 5, series 14, image 73). No discrete fluid collections appreciated. Findings are concerning for gas-forming infection (Fournier's gangrene). Recommend surgical consultation. Electronically Signed   By: Eddie Candle M.D.   On: 01/22/2019 23:14   Dg Chest Port 1 View  Result Date: 01/29/2019 CLINICAL DATA:  43 year old female tested positive for COVID-19. EXAM: PORTABLE CHEST 1 VIEW  COMPARISON:  Chest x-ray 01/28/2019. FINDINGS: An endotracheal tube is in place with tip 3.0 cm above the carina. There is a right upper extremity PICC with tip terminating in the superior cavoatrial junction. A nasogastric tube is seen extending into the stomach, however, the tip of the nasogastric tube extends below the lower margin of the image. Lung volumes are low. Severe patchy multifocal interstitial and airspace disease throughout the lungs bilaterally, with no significant change in aeration compared to yesterday's examination. No pleural effusions. No evidence of pulmonary edema. Heart size is normal. Upper mediastinal contours are within normal limits. IMPRESSION: 1. Support apparatus, as above. 2. Severe multilobar pneumonia redemonstrated with no significant change in aeration compared to yesterday's examination. Electronically Signed   By: Vinnie Langton M.D.   On: 01/29/2019 07:23   Dg Chest Port 1 View  Result Date: 01/28/2019 CLINICAL DATA:  43 year old female with history of pneumonia from COVID-19. EXAM: PORTABLE CHEST 1 VIEW COMPARISON:  Chest x-ray 01/25/2019. FINDINGS: Low-lying endotracheal tube with tip approximately 9 mm above the carina. A nasogastric tube is seen extending into the stomach, however, the tip of the nasogastric tube extends below the lower margin of the image. Lung volumes are low. Patchy multifocal interstitial and airspace disease noted throughout the lungs bilaterally. No pleural effusions. No cephalization of the pulmonary vasculature. Heart size is borderline enlarged. Upper mediastinal contours are within normal limits. IMPRESSION: 1. Support apparatus, as above. Please take note of the low position of the endotracheal tube and consider withdrawal approximately 4 cm for more optimal placement. 2. Severe multilobar pneumonia redemonstrated, as above. Electronically Signed   By: Vinnie Langton M.D.   On: 01/28/2019 07:35   Dg Chest Port 1 View  Result Date:  01/26/2019 CLINICAL DATA:  Acute respiratory failure with hypoxia EXAM: PORTABLE CHEST 1 VIEW COMPARISON:  01/25/2019 FINDINGS: Cardiac shadow is within normal limits. Endotracheal tube is noted just above the carina. Gastric catheter and PICC line on the right are seen and stable. Patchy infiltrates are again identified and stable. No new focal abnormality is seen. IMPRESSION: Stable patchy infiltrates bilaterally. Tubes and lines as described. Electronically Signed   By: Inez Catalina M.D.   On:  01/26/2019 07:39   Dg Chest Port 1 View  Result Date: 01/25/2019 CLINICAL DATA:  Endotracheal tube positioning. EXAM: PORTABLE CHEST 1 VIEW COMPARISON:  01/23/2019 FINDINGS: The endotracheal tube terminates in the right mainstem bronchus. The right-sided PICC line is well position. Enteric tube terminates below the left hemidiaphragm. The lung volumes are low. There are diffuse hazy bilateral airspace opacities which have worsened since the prior study. There is no pneumothorax. The cardiac silhouette is stable. There is worsening retrocardiac consolidation. IMPRESSION: 1. Endotracheal tube terminates in the right mainstem bronchus. Repositioning is recommended. 2. The right-sided PICC line is well positioned. The enteric tube terminates below the left hemidiaphragm. 3. Diffuse hazy bilateral airspace opacities, significantly worsened since prior study. There is new retrocardiac consolidation. Findings can be seen in patients with pulmonary edema or an atypical infectious process. Developing ARDS can have a similar appearance. These results will be called to the ordering clinician or representative by the Radiologist Assistant, and communication documented in the PACS or zVision Dashboard. Electronically Signed   By: Constance Holster M.D.   On: 01/25/2019 17:51   Dg Chest Port 1 View  Result Date: 01/23/2019 CLINICAL DATA:  Fevers and positive COVID-19 EXAM: PORTABLE CHEST 1 VIEW COMPARISON:  09/19/2016 FINDINGS:  Cardiac shadows within normal limits. The overall inspiratory effort is poor. Some patchy increased density is noted in the right upper lobe which could be related to mild ground-glass infiltrate. No sizable effusion is seen. No bony abnormality is noted. IMPRESSION: Patchy right upper lobe infiltrate. Electronically Signed   By: Inez Catalina M.D.   On: 01/23/2019 07:22   [place] Ekg Site Rite  Result Date: 01/25/2019 If Site Rite image not attached, placement could not be confirmed due to current cardiac rhythm.    Subjective: "I want to go home." Feels well, pain controlled, getting up and walking around without dyspnea.   Discharge Exam: Vitals:   02/06/19 0800 02/06/19 1126  BP: 132/74 131/69  Pulse:  83  Resp:  20  Temp: 98.6 F (37 C) 98.9 F (37.2 C)  SpO2: 96% 95%   General: Pt is alert, awake, not in acute distress Cardiovascular: RRR, S1/S2 +, no rubs, no gallops Respiratory: CTA bilaterally, no wheezing, no rhonchi Abdominal: Soft, NT, ND, bowel sounds + Extremities: No edema, no cyanosis  Labs: BNP (last 3 results) Recent Labs    01/23/19 0513  BNP 00.7   Basic Metabolic Panel: Recent Labs  Lab 01/31/19 0215 02/01/19 0500 02/02/19 0500 02/05/19 0500  NA 151* 139 143 139  K 3.1* 3.1* 3.5 3.8  CL 120* 108 110 100  CO2 _0 32  GLUCOSE 102* 95 109* 92  BUN 49* 34* 22* 9  CREATININE 1.12* 1.00 0.98 0.92  CALCIUM 8.5* 8.0* 8.1* 8.0*  MG  --   --  1.7  --    Liver Function Tests: Recent Labs  Lab 01/31/19 0215 02/01/19 0500 02/02/19 0500  AST 16 14* 17  ALT _1 ALKPHOS 89 78 80  BILITOT 0.2* 0.4 0.6  PROT 5.9* 5.6* 5.4*  ALBUMIN 1.9* 1.9* 2.0*   No results for input(s): LIPASE, AMYLASE in the last 168 hours. No results for input(s): AMMONIA in the last 168 hours. CBC: Recent Labs  Lab 01/31/19 0215 02/01/19 0500 02/02/19 0500 02/05/19 0500  WBC 18.3* 17.8* 12.9* 7.8  NEUTROABS  --   --   --  3.4  HGB 9.5* 9.9* 10.0* 8.6*  HCT  30.2* 29.0*  30.5* 26.9*  MCV 85.6 83.3 83.6 84.9  PLT 336 390 401* 315   Cardiac Enzymes: No results for input(s): CKTOTAL, CKMB, CKMBINDEX, TROPONINI in the last 168 hours. BNP: Invalid input(s): POCBNP CBG: Recent Labs  Lab 02/05/19 1603 02/05/19 1714 02/05/19 2138 02/06/19 0817 02/06/19 1125  GLUCAP 88 131* 134* 84 83   D-Dimer No results for input(s): DDIMER in the last 72 hours. Hgb A1c No results for input(s): HGBA1C in the last 72 hours. Lipid Profile No results for input(s): CHOL, HDL, LDLCALC, TRIG, CHOLHDL, LDLDIRECT in the last 72 hours. Thyroid function studies No results for input(s): TSH, T4TOTAL, T3FREE, THYROIDAB in the last 72 hours.  Invalid input(s): FREET3 Anemia work up No results for input(s): VITAMINB12, FOLATE, FERRITIN, TIBC, IRON, RETICCTPCT in the last 72 hours. Urinalysis    Component Value Date/Time   COLORURINE AMBER (A) 01/26/2019 0341   APPEARANCEUR CLOUDY (A) 01/26/2019 0341   LABSPEC 1.025 01/26/2019 0341   PHURINE 5.0 01/26/2019 0341   GLUCOSEU NEGATIVE 01/26/2019 0341   GLUCOSEU >=1000 (A) 05/20/2016 1347   HGBUR NEGATIVE 01/26/2019 0341   BILIRUBINUR NEGATIVE 01/26/2019 0341   BILIRUBINUR n 06/15/2016 1125   KETONESUR NEGATIVE 01/26/2019 0341   PROTEINUR 100 (A) 01/26/2019 0341   UROBILINOGEN negative 06/15/2016 1125   UROBILINOGEN 0.2 05/20/2016 1347   NITRITE NEGATIVE 01/26/2019 0341   LEUKOCYTESUR TRACE (A) 01/26/2019 0341    Microbiology No results found for this or any previous visit (from the past 240 hour(s)).  Time coordinating discharge: Approximately 40 minutes  Patrecia Pour, MD  [facility] 02/06/2019, 2:34 PM

## 2019-02-06 NOTE — Progress Notes (Addendum)
PHARMACY CONSULT NOTE FOR:  OUTPATIENT  PARENTERAL ANTIBIOTIC THERAPY (OPAT)  Indication: Fournier's gangrene Regimen: Merrem 2g IV q8 + Caspofungin 50mg  IV q24 End date: 02/13/19  IV antibiotic discharge orders are pended. To discharging provider:  please sign these orders via discharge navigator,  Select New Orders & click on the button choice - Manage This Unsigned Work.     Onnie Boer, PharmD, BCIDP, AAHIVP, CPP Infectious Disease Pharmacist 02/06/2019 12:46 PM   OPAT Order Details            .Outpatient Parenteral Antibiotic Therapy Consult  Until discontinued    Provider:  (Not yet assigned)  Question Answer Comment  Antibiotic Caspofungin   Antibiotic Merrem   Indications for use Fournier's gangrene   End Date 02/13/2019

## 2019-02-06 NOTE — Progress Notes (Signed)
On phone to family- wet/dry drsg changed- wound irrigated with saline flush
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
## 2019-02-06 NOTE — Progress Notes (Signed)
Occupational Therapy Treatment Patient Details Name: Maureen Price MRN: [id number] DOB: 11-Dec-1975 Today's Date: 02/06/2019    History of present illness 43 y.o. female who worked at [employer] with [place] of asthma, poorly controlled type II DM, GERD, depression, CAD S/P PCI, active smoker, obesity; admitted on 01/22/2019, presented with complaint of perineal pain, was found to have Fournier's gangrene.  S/P incision and debridement 01/23/2019.  Her SARS-CoV-2 was positive.  01/25/2019 patient became more hypoxic, unable to maintain adequate saturation on nonrebreather.  She was intubated and then subsequently transferred to the [facility].  ETT 5/20-25.     OT comments  Pt progressing towards established OT goals and presents with high motivation to participate in therapy and return to PLOF. Pt performing grooming at sink and functional mobility with Supervision and RW. Pt demonstrating increased ROM to reach for donning socks with supervision. Providing education on safe tub transfer with 3n1 and RW and pt performing with Min Guard A for safety. Update dc to home once medically stable and will continue to follow acutely as admitted.    Follow Up Recommendations  No OT follow up;Supervision/Assistance - 24 hour    Equipment Recommendations  3 in 1 bedside commode    Recommendations for Other Services PT consult    Precautions / Restrictions Precautions Precautions: None Precaution Comments: peri wds Restrictions Weight Bearing Restrictions: No       Mobility Bed Mobility Overal bed mobility: Independent                Transfers Overall transfer level: Needs assistance Equipment used: Rolling walker (2 wheeled) Transfers: Sit to/from Stand Sit to Stand: Supervision         General transfer comment: Supervision for safety    Balance Overall balance assessment: No apparent balance deficits (not formally assessed)                                          ADL either performed or assessed with clinical judgement   ADL Overall ADL's : Needs assistance/impaired     Grooming: Oral care;Wash/dry face;Standing;Supervision/safety;Set up Grooming Details (indicate cue type and reason): Supervision for safety as pt performed oral care and washed her face             Lower Body Dressing: Supervision/safety;Set up;Sit to/from stand Lower Body Dressing Details (indicate cue type and reason): Pt donning her socks while sitting at EOB Toilet Transfer: Min guard;Ambulation;RW(Simulated to recliner)       Tub/ Shower Transfer: Min guard;3 in Chartered certified accountant Details (indicate cue type and reason): Educating pt on safe tub transfer with 3N1. Min Guard A for safety Functional mobility during ADLs: Rolling walker;Supervision/safety General ADL Comments: Pt performing grooming at sink, functional mobility in hallway, tub transfer with 3N1, and HEP     Vision       Perception     Praxis      Cognition Arousal/Alertness: Awake/alert Behavior During Therapy: WFL for tasks assessed/performed Overall Cognitive Status: Within Functional Limits for tasks assessed                                 General Comments: Motivated to participate in therapy        Exercises Exercises: General Upper Extremity;Other exercises General Exercises - Upper Extremity Shoulder Horizontal  ABduction: AROM;Both;5 reps;Seated;Theraband Theraband Level (Shoulder Horizontal Abduction): Level 1 (Yellow) Elbow Flexion: AROM;Both;5 reps;Theraband;Seated Theraband Level (Elbow Flexion): Level 1 (Yellow) Elbow Extension: AROM;Both;5 reps;Seated;Theraband Theraband Level (Elbow Extension): Level 1 (Yellow) Other Exercises Other Exercises: Reviewed HEP for UE and LE with theraband. Pt demonstrating understanding.    Shoulder Instructions       General Comments VSS on RA    Pertinent Vitals/ Pain       Pain  Assessment: Faces Faces Pain Scale: Hurts a little bit Pain Location: Peri area when straining Pain Descriptors / Indicators: Discomfort Pain Intervention(s): Monitored during session;Repositioned  Home Living                                          Prior Functioning/Environment              Frequency  Min 3X/week        Progress Toward Goals  OT Goals(current goals can now be found in the care plan section)  Progress towards OT goals: Progressing toward goals  Acute Rehab OT Goals Patient Stated Goal: be able to return home alone to care for herself OT Goal Formulation: With patient Time For Goal Achievement: 02/14/19 Potential to Achieve Goals: Good ADL Goals Pt Will Perform Grooming: with modified independence;standing Pt Will Perform Lower Body Dressing: with set-up;with supervision;sit to/from stand Pt Will Transfer to Toilet: with modified independence;ambulating;bedside commode Pt Will Perform Toileting - Clothing Manipulation and hygiene: with modified independence;sit to/from stand;sitting/lateral leans;with adaptive equipment Pt Will Perform Tub/Shower Transfer: Tub transfer;3 in 1;rolling walker;with min guard assist;ambulating  Plan Discharge plan remains appropriate    Co-evaluation                 AM-PAC OT "6 Clicks" Daily Activity     Outcome Measure   Help from another person eating meals?: None Help from another person taking care of personal grooming?: A Little Help from another person toileting, which includes using toliet, bedpan, or urinal?: A Lot Help from another person bathing (including washing, rinsing, drying)?: A Little Help from another person to put on and taking off regular upper body clothing?: A Little Help from another person to put on and taking off regular lower body clothing?: A Lot 6 Click Score: 17    End of Session Equipment Utilized During Treatment: Rolling walker  OT Visit Diagnosis:  Unsteadiness on feet (R26.81);Other abnormalities of gait and mobility (R26.89);Muscle weakness (generalized) (M62.81);Pain Pain - Right/Left: Right Pain - part of body: (peri area)   Activity Tolerance Patient tolerated treatment well   Patient Left in chair;with call bell/phone within reach   Nurse Communication Mobility status        Time: 5631-4970 OT Time Calculation (min): 25 min  Charges: OT General Charges $OT Visit: 1 Visit OT Treatments $Self Care/Home Management : 23-37 mins  [place], OTR/L Acute Rehab Pager: [phone] Office: [place] 02/06/2019, 12:22 PM

## 2019-02-06 NOTE — Plan of Care (Signed)
  Problem: Clinical Measurements: Goal: Ability to maintain clinical measurements within normal limits will improve Outcome: Progressing Goal: Will remain free from infection Outcome: Progressing Goal: Diagnostic test results will improve Outcome: Progressing Goal: Respiratory complications will improve Outcome: Progressing   Problem: Activity: Goal: Risk for activity intolerance will decrease Outcome: Progressing   Problem: Elimination: Goal: Will not experience complications related to urinary retention Outcome: Progressing   Problem: Pain Managment: Goal: General experience of comfort will improve Outcome: Progressing   Problem: Safety: Goal: Ability to remain free from injury will improve Outcome: Progressing   Problem: Skin Integrity: Goal: Risk for impaired skin integrity will decrease Outcome: Progressing   Problem: Respiratory: Goal: Complications related to the disease process, condition or treatment will be avoided or minimized Outcome: Progressing

## 2019-02-07 ENCOUNTER — Telehealth: Payer: Self-pay

## 2019-02-07 DIAGNOSIS — I70213 Atherosclerosis of native arteries of extremities with intermittent claudication, bilateral legs: Secondary | ICD-10-CM | POA: Diagnosis not present

## 2019-02-07 DIAGNOSIS — U071 COVID-19: Secondary | ICD-10-CM | POA: Diagnosis not present

## 2019-02-07 DIAGNOSIS — A4150 Gram-negative sepsis, unspecified: Secondary | ICD-10-CM | POA: Diagnosis not present

## 2019-02-07 DIAGNOSIS — E1151 Type 2 diabetes mellitus with diabetic peripheral angiopathy without gangrene: Secondary | ICD-10-CM | POA: Diagnosis not present

## 2019-02-07 DIAGNOSIS — A4151 Sepsis due to Escherichia coli [E. coli]: Secondary | ICD-10-CM | POA: Diagnosis not present

## 2019-02-07 DIAGNOSIS — T797XXD Traumatic subcutaneous emphysema, subsequent encounter: Secondary | ICD-10-CM | POA: Diagnosis not present

## 2019-02-07 DIAGNOSIS — J1289 Other viral pneumonia: Secondary | ICD-10-CM | POA: Diagnosis not present

## 2019-02-07 DIAGNOSIS — L0232 Furuncle of buttock: Secondary | ICD-10-CM | POA: Diagnosis not present

## 2019-02-07 DIAGNOSIS — Z466 Encounter for fitting and adjustment of urinary device: Secondary | ICD-10-CM | POA: Diagnosis not present

## 2019-02-07 DIAGNOSIS — S3140XD Unspecified open wound of vagina and vulva, subsequent encounter: Secondary | ICD-10-CM | POA: Diagnosis not present

## 2019-02-07 DIAGNOSIS — N7689 Other specified inflammation of vagina and vulva: Secondary | ICD-10-CM | POA: Diagnosis not present

## 2019-02-07 DIAGNOSIS — N764 Abscess of vulva: Secondary | ICD-10-CM | POA: Diagnosis not present

## 2019-02-07 DIAGNOSIS — A4101 Sepsis due to Methicillin susceptible Staphylococcus aureus: Secondary | ICD-10-CM | POA: Diagnosis not present

## 2019-02-07 DIAGNOSIS — J80 Acute respiratory distress syndrome: Secondary | ICD-10-CM | POA: Diagnosis not present

## 2019-02-07 DIAGNOSIS — A409 Streptococcal sepsis, unspecified: Secondary | ICD-10-CM | POA: Diagnosis not present

## 2019-02-07 DIAGNOSIS — B379 Candidiasis, unspecified: Secondary | ICD-10-CM | POA: Diagnosis not present

## 2019-02-07 NOTE — Telephone Encounter (Signed)
Nira Conn [facility] Nurse calling for verbal order to pull PICC on February 13, 2019.  Documentation notes IV antibiotics due to end on 02-12-2019.   Verbal order given to pull PICC on 02-13-2019.  Patient does not have a visit scheduled with [facility].     Laverle Patter, RN

## 2019-02-08 ENCOUNTER — Telehealth: Payer: Self-pay | Admitting: *Deleted

## 2019-02-08 ENCOUNTER — Telehealth: Payer: Self-pay

## 2019-02-08 NOTE — Telephone Encounter (Signed)
CM spoke to the patient via phone for her hospital f/u call. Patient stated she is doing well; administering her IV ABTs as instructed with no distress noted. Patient instructed to contact her PCP if her condition worsens, with the patient verbalizing understanding.  Midge Minium RN, BSN, NCM-BC, ACM-RN [phone]

## 2019-02-08 NOTE — Telephone Encounter (Signed)
Copied from [place] [id number]. Topic: Quick Communication - Home Health Verbal Orders >> Feb 07, 2019  2:10 PM Beverley Fiedler wrote: Caller/Agency: [place] Number: [phone] Requesting OT/PT/Skilled Nursing/Social Work/Speech Therapy: Nursing for wound care  [facility] called requesting a callback in regards to Dr. Ronnald Ramp signing orders for home care.

## 2019-02-08 NOTE — Telephone Encounter (Signed)
Pt was admitted on 01/22/19 for fournier's gangrene, also test positive for COVID 19. Pt was D/C from the hospital 02/06/19, and is schedule for virtual appt on 02/09/19.Marland KitchenJohny Chess

## 2019-02-09 ENCOUNTER — Ambulatory Visit (INDEPENDENT_AMBULATORY_CARE_PROVIDER_SITE_OTHER): Payer: BC Managed Care – PPO | Admitting: Internal Medicine

## 2019-02-09 ENCOUNTER — Encounter: Payer: Self-pay | Admitting: Internal Medicine

## 2019-02-09 DIAGNOSIS — S3140XD Unspecified open wound of vagina and vulva, subsequent encounter: Secondary | ICD-10-CM | POA: Diagnosis not present

## 2019-02-09 DIAGNOSIS — N179 Acute kidney failure, unspecified: Secondary | ICD-10-CM

## 2019-02-09 DIAGNOSIS — E1151 Type 2 diabetes mellitus with diabetic peripheral angiopathy without gangrene: Secondary | ICD-10-CM

## 2019-02-09 DIAGNOSIS — B379 Candidiasis, unspecified: Secondary | ICD-10-CM | POA: Diagnosis not present

## 2019-02-09 DIAGNOSIS — I70213 Atherosclerosis of native arteries of extremities with intermittent claudication, bilateral legs: Secondary | ICD-10-CM | POA: Diagnosis not present

## 2019-02-09 DIAGNOSIS — I70209 Unspecified atherosclerosis of native arteries of extremities, unspecified extremity: Secondary | ICD-10-CM

## 2019-02-09 DIAGNOSIS — A4150 Gram-negative sepsis, unspecified: Secondary | ICD-10-CM | POA: Diagnosis not present

## 2019-02-09 DIAGNOSIS — J80 Acute respiratory distress syndrome: Secondary | ICD-10-CM | POA: Diagnosis not present

## 2019-02-09 DIAGNOSIS — I5032 Chronic diastolic (congestive) heart failure: Secondary | ICD-10-CM

## 2019-02-09 DIAGNOSIS — A4101 Sepsis due to Methicillin susceptible Staphylococcus aureus: Secondary | ICD-10-CM | POA: Diagnosis not present

## 2019-02-09 DIAGNOSIS — A419 Sepsis, unspecified organism: Secondary | ICD-10-CM | POA: Diagnosis not present

## 2019-02-09 DIAGNOSIS — J1289 Other viral pneumonia: Secondary | ICD-10-CM | POA: Diagnosis not present

## 2019-02-09 DIAGNOSIS — A409 Streptococcal sepsis, unspecified: Secondary | ICD-10-CM | POA: Diagnosis not present

## 2019-02-09 DIAGNOSIS — N7689 Other specified inflammation of vagina and vulva: Secondary | ICD-10-CM | POA: Diagnosis not present

## 2019-02-09 DIAGNOSIS — N764 Abscess of vulva: Secondary | ICD-10-CM | POA: Diagnosis not present

## 2019-02-09 DIAGNOSIS — E1165 Type 2 diabetes mellitus with hyperglycemia: Secondary | ICD-10-CM

## 2019-02-09 DIAGNOSIS — A4151 Sepsis due to Escherichia coli [E. coli]: Secondary | ICD-10-CM | POA: Diagnosis not present

## 2019-02-09 DIAGNOSIS — IMO0002 Reserved for concepts with insufficient information to code with codable children: Secondary | ICD-10-CM

## 2019-02-09 DIAGNOSIS — T797XXD Traumatic subcutaneous emphysema, subsequent encounter: Secondary | ICD-10-CM | POA: Diagnosis not present

## 2019-02-09 DIAGNOSIS — Z466 Encounter for fitting and adjustment of urinary device: Secondary | ICD-10-CM | POA: Diagnosis not present

## 2019-02-09 DIAGNOSIS — U071 COVID-19: Secondary | ICD-10-CM | POA: Diagnosis not present

## 2019-02-09 DIAGNOSIS — R6520 Severe sepsis without septic shock: Secondary | ICD-10-CM

## 2019-02-09 DIAGNOSIS — L0232 Furuncle of buttock: Secondary | ICD-10-CM | POA: Diagnosis not present

## 2019-02-09 MED ORDER — INSULIN DEGLUDEC 200 UNIT/ML ~~LOC~~ SOPN: 50.0000 [IU] | PEN_INJECTOR | Freq: Every day | SUBCUTANEOUS | 1 refills | Status: DC

## 2019-02-09 NOTE — Progress Notes (Addendum)
Virtual Visit via Video Note  I connected with [name] on 02/09/19 at 11:00 AM EDT by a video enabled telemedicine application and verified that I am speaking with the correct person using two identifiers.   I discussed the limitations of evaluation and management by telemedicine and the availability of in person appointments. The patient expressed understanding and agreed to proceed.  History of Present Illness: She checked in for a virtual visit.  She was not willing to come in for an in person visit due to the COVID-19 pandemic.  She was recently admitted for over 2 weeks for COVID-19 infection and Fournier's gangrene.  She tells me that she is feeling much better and has been able to control her blood sugar with a combination of an SGLT-2 inh and a basal insulin.  She says her recent blood sugars have been in the 130-145 range.  She has follow-up soon with a general surgeon.  She continues to struggle with laryngitis, nonproductive cough, low-grade fever up to 99.9 and chills.  She has a PICC line in place.  She denies shortness of breath, hemoptysis, night sweats, or abd/pelvic pain, or loss of appetite.  She has failed Bydureon, Toujeo, and Lantus.  I will treat her hyperglycemia with [place].    Observations/Objective: Mildly ill-appearing female but in no acute distress.  She was calm, cooperative and appropriate.  She tells me her blood sugar is 144.   Lab Results  Component Value Date   WBC 7.8 02/05/2019   HGB 8.6 (L) 02/05/2019   HCT 26.9 (L) 02/05/2019   PLT 315 02/05/2019   GLUCOSE 92 02/05/2019   CHOL 150 02/28/2018   TRIG 158 (H) 01/30/2019   HDL 28.10 (L) 02/28/2018   LDLCALC 106 (H) 02/28/2018   ALT 25 02/02/2019   AST 17 02/02/2019   NA 139 02/05/2019   K 3.8 02/05/2019   CL 100 02/05/2019   CREATININE 0.92 02/05/2019   BUN 9 02/05/2019   CO2 32 02/05/2019   TSH 1.70 02/02/2018   INR 1.3 (H) 01/23/2019   HGBA1C 11.3 (H) 01/23/2019   MICROALBUR 4.0  (H) 06/23/2017    Assessment and Plan: She has made a remarkable recovery.  It sounds like her blood sugars are relatively well controlled so I have asked her to continue taking the SGLT2 inhibitor and basal insulin. I asked her to increase the dose of the basal insulin as needed.  I recommended that she stay in isolation for the next 2 weeks.   Follow Up Instructions: She agrees to remain in isolation.  She will continue to monitor and treat her blood sugars.  She will let me know if she develops any new or worsening symptoms.  Will come back in for an in person visit in approximately 3 weeks.    I discussed the assessment and treatment plan with the patient. The patient was provided an opportunity to ask questions and all were answered. The patient agreed with the plan and demonstrated an understanding of the instructions.   The patient was advised to call back or seek an in-person evaluation if the symptoms worsen or if the condition fails to improve as anticipated.  I provided 30 minutes of non-face-to-face time during this encounter.   [name], MD

## 2019-02-09 NOTE — Telephone Encounter (Signed)
[facility] with verbal okay for wound care. Also gave side a fax number to send any orders that they need signed.

## 2019-02-10 ENCOUNTER — Telehealth: Payer: Self-pay

## 2019-02-10 NOTE — Telephone Encounter (Signed)
Voice mail: Calling regarding abnormal lab values of potassium 6.8 . She is not sure if the has been addressed. Are new labs needed?   I do not see any documentation of abnormal lab or indication of call to ID providers.

## 2019-02-12 ENCOUNTER — Encounter: Payer: Self-pay | Admitting: Internal Medicine

## 2019-02-13 ENCOUNTER — Telehealth: Payer: Self-pay

## 2019-02-13 NOTE — Telephone Encounter (Signed)
Nira Conn, RN [facility] is calling regarding PICC removal date. PICC is due for removal today and patient has at least 11 dose of meropenum left.  RN wonders if medications were extended. I do not see any orders for extension.   I called [place] to ensure there were no additional orders and there was not.  The medication sent was to end on 02-12-2019.  Informed Heather , RN no change in doses., it is possible the patient did not take the medications as prescribed.    I will inform Mihn Pham, RPH of additonal medications and possible incomplete treatment via IV medications.

## 2019-02-13 NOTE — Telephone Encounter (Signed)
[facility] Nurse notified, no change in treatment regimen or PICC removal.   Ok to pull PICC.  Advised to inform patieint to call the office if she thinks  there was a change in orders. Laverle Patter, RN

## 2019-02-14 ENCOUNTER — Telehealth: Payer: Self-pay | Admitting: Internal Medicine

## 2019-02-14 DIAGNOSIS — L0232 Furuncle of buttock: Secondary | ICD-10-CM | POA: Diagnosis not present

## 2019-02-14 DIAGNOSIS — Z466 Encounter for fitting and adjustment of urinary device: Secondary | ICD-10-CM | POA: Diagnosis not present

## 2019-02-14 DIAGNOSIS — A4101 Sepsis due to Methicillin susceptible Staphylococcus aureus: Secondary | ICD-10-CM | POA: Diagnosis not present

## 2019-02-14 DIAGNOSIS — I70213 Atherosclerosis of native arteries of extremities with intermittent claudication, bilateral legs: Secondary | ICD-10-CM | POA: Diagnosis not present

## 2019-02-14 DIAGNOSIS — J1289 Other viral pneumonia: Secondary | ICD-10-CM | POA: Diagnosis not present

## 2019-02-14 DIAGNOSIS — N764 Abscess of vulva: Secondary | ICD-10-CM | POA: Diagnosis not present

## 2019-02-14 DIAGNOSIS — J80 Acute respiratory distress syndrome: Secondary | ICD-10-CM | POA: Diagnosis not present

## 2019-02-14 DIAGNOSIS — E1151 Type 2 diabetes mellitus with diabetic peripheral angiopathy without gangrene: Secondary | ICD-10-CM | POA: Diagnosis not present

## 2019-02-14 DIAGNOSIS — B379 Candidiasis, unspecified: Secondary | ICD-10-CM | POA: Diagnosis not present

## 2019-02-14 DIAGNOSIS — A4150 Gram-negative sepsis, unspecified: Secondary | ICD-10-CM | POA: Diagnosis not present

## 2019-02-14 DIAGNOSIS — U071 COVID-19: Secondary | ICD-10-CM | POA: Diagnosis not present

## 2019-02-14 DIAGNOSIS — S3140XD Unspecified open wound of vagina and vulva, subsequent encounter: Secondary | ICD-10-CM | POA: Diagnosis not present

## 2019-02-14 DIAGNOSIS — A4151 Sepsis due to Escherichia coli [E. coli]: Secondary | ICD-10-CM | POA: Diagnosis not present

## 2019-02-14 DIAGNOSIS — T797XXD Traumatic subcutaneous emphysema, subsequent encounter: Secondary | ICD-10-CM | POA: Diagnosis not present

## 2019-02-14 DIAGNOSIS — A409 Streptococcal sepsis, unspecified: Secondary | ICD-10-CM | POA: Diagnosis not present

## 2019-02-14 DIAGNOSIS — N7689 Other specified inflammation of vagina and vulva: Secondary | ICD-10-CM | POA: Diagnosis not present

## 2019-02-14 LAB — MISC LABCORP TEST (SEND OUT): Labcorp test code: 183517

## 2019-02-14 LAB — ANTIFUNGAL SUSCEP, FLUCONAZOLE: Fluconazole Islt MIC: 16

## 2019-02-14 NOTE — Telephone Encounter (Signed)
Key: [id number]

## 2019-02-14 NOTE — Telephone Encounter (Unsigned)
Copied from [place] [id number]. Topic: Quick Communication - Rx Refill/Question >> Feb 14, 2019 10:04 AM Alanda Slim E wrote: Medication: Insulin Degludec (TRESIBA FLEXTOUCH) 200 UNIT/ML SOPN - PA needed. / Pt is all out and wants to know if she can get a sample Pin until the PA is done/ please advise   Has the patient contacted their pharmacy?  Yes- PA needed

## 2019-02-14 NOTE — Telephone Encounter (Signed)
Cassandra with cover my meds states optum could not locate the pt and she is requesting this PA be done manually (new form) or call. She is going to fax a new form now   cb # [phone]  Key [id number]

## 2019-02-15 DIAGNOSIS — T8189XA Other complications of procedures, not elsewhere classified, initial encounter: Secondary | ICD-10-CM | POA: Diagnosis not present

## 2019-02-15 NOTE — Telephone Encounter (Signed)
Cassandra with Cover My Meds called to inquire on the deleted prior authorization they have seen in their system. She wanted to make sure that it was an authorized deletion and not an accidental one. Please call Cassandra at Ph# [phone]

## 2019-02-16 ENCOUNTER — Telehealth: Payer: Self-pay | Admitting: Internal Medicine

## 2019-02-16 NOTE — Telephone Encounter (Signed)
Advised patient we do not have any samples in office right now---we are resending PA request to cover my meds---cover my meds is giving error code of patient is not recognized by [employer]---however, all numbers on insurance card do match, we are also calling [employer] to see why her member ID is not valid----we will call patient back when we get resolved, may not be today

## 2019-02-16 NOTE — Telephone Encounter (Signed)
I called [phone] and spoke to [place]. She is faxing PA form for completion to [place] at [phone].  Patient's member ID is: [phone]

## 2019-02-16 NOTE — Telephone Encounter (Signed)
Pt calling back to check status of PA. Pt would like to know if we have any sample pens because pt is out . Please advise

## 2019-02-16 NOTE — Telephone Encounter (Signed)
Copied from [place] [id number]. Topic: Quick Communication - Rx Refill/Question >> Feb 14, 2019 10:04 AM Alanda Slim E wrote: Medication: Insulin Degludec (TRESIBA FLEXTOUCH) 200 UNIT/ML SOPN - PA needed. / Pt is all out and wants to know if she can get a sample Pin until the PA is done/ please advise   Has the patient contacted their pharmacy?  Yes- PA needed >> Feb 16, 2019  3:37 PM Keene Breath wrote: Patient is calling again to check the status of her request.  She is all out of medication and still has not heard anything about the samples.  Please call patient back at [phone]

## 2019-02-17 NOTE — Telephone Encounter (Signed)
erica from [employer] called - they had additional questions for this PA  Please call [phone] Reference number is [place] [id number]

## 2019-02-17 NOTE — Telephone Encounter (Signed)
Form received, completed and faxed back to number provided, I have advised patient---faxed copies on Del Overfelt's desk

## 2019-02-21 DIAGNOSIS — N7689 Other specified inflammation of vagina and vulva: Secondary | ICD-10-CM | POA: Diagnosis not present

## 2019-02-21 DIAGNOSIS — U071 COVID-19: Secondary | ICD-10-CM | POA: Diagnosis not present

## 2019-02-21 DIAGNOSIS — S3140XD Unspecified open wound of vagina and vulva, subsequent encounter: Secondary | ICD-10-CM | POA: Diagnosis not present

## 2019-02-21 DIAGNOSIS — J80 Acute respiratory distress syndrome: Secondary | ICD-10-CM | POA: Diagnosis not present

## 2019-02-21 DIAGNOSIS — A4151 Sepsis due to Escherichia coli [E. coli]: Secondary | ICD-10-CM | POA: Diagnosis not present

## 2019-02-21 DIAGNOSIS — B379 Candidiasis, unspecified: Secondary | ICD-10-CM | POA: Diagnosis not present

## 2019-02-21 DIAGNOSIS — A409 Streptococcal sepsis, unspecified: Secondary | ICD-10-CM | POA: Diagnosis not present

## 2019-02-21 DIAGNOSIS — A4150 Gram-negative sepsis, unspecified: Secondary | ICD-10-CM | POA: Diagnosis not present

## 2019-02-21 DIAGNOSIS — A4101 Sepsis due to Methicillin susceptible Staphylococcus aureus: Secondary | ICD-10-CM | POA: Diagnosis not present

## 2019-02-21 DIAGNOSIS — N764 Abscess of vulva: Secondary | ICD-10-CM | POA: Diagnosis not present

## 2019-02-21 DIAGNOSIS — E1151 Type 2 diabetes mellitus with diabetic peripheral angiopathy without gangrene: Secondary | ICD-10-CM | POA: Diagnosis not present

## 2019-02-21 DIAGNOSIS — L0232 Furuncle of buttock: Secondary | ICD-10-CM | POA: Diagnosis not present

## 2019-02-21 DIAGNOSIS — Z466 Encounter for fitting and adjustment of urinary device: Secondary | ICD-10-CM | POA: Diagnosis not present

## 2019-02-21 DIAGNOSIS — I70213 Atherosclerosis of native arteries of extremities with intermittent claudication, bilateral legs: Secondary | ICD-10-CM | POA: Diagnosis not present

## 2019-02-21 DIAGNOSIS — T797XXD Traumatic subcutaneous emphysema, subsequent encounter: Secondary | ICD-10-CM | POA: Diagnosis not present

## 2019-02-21 DIAGNOSIS — J1289 Other viral pneumonia: Secondary | ICD-10-CM | POA: Diagnosis not present

## 2019-02-21 NOTE — Telephone Encounter (Signed)
PT has called again stating she still is getting feedback that the pharmacy has not gotten PA on insulin. I encouraged her to call the insurance company and she said she did not have any of the numbers to call that everything is in Tamera's hands. I provided her with the info to also as the member to reach out as well. I disclosed the documentation that the PA form was received , filled out and faxed back to requestor and her called the insurance would facilate. She wants Tamera to call her back and discuss.  Pt also wanst Dr Ronnald Ramp to fax a note for her to be out of work two more weeks due to her throat is still itchy. She is requesting that a note stating when she tested positive for COVID and when she can return to work be faxed to her boss, Percell Miller at [phone]

## 2019-02-21 NOTE — Telephone Encounter (Signed)
Please advise.  Patient requesting note.

## 2019-02-22 ENCOUNTER — Encounter: Payer: Self-pay | Admitting: Internal Medicine

## 2019-02-22 LAB — FUNGAL ORGANISM REFLEX

## 2019-02-22 LAB — FUNGUS CULTURE RESULT

## 2019-02-22 LAB — FUNGUS CULTURE WITH STAIN

## 2019-02-22 NOTE — Telephone Encounter (Signed)
[employer] and they are requiring more notes. I have printed and sent notes as requested.

## 2019-02-22 NOTE — Telephone Encounter (Signed)
Pt called in again to check on the status.  She stated she called ins compy and they are waiting on a response.  She is completely out of her meds and her sugar is up.  Please advise

## 2019-02-23 ENCOUNTER — Other Ambulatory Visit: Payer: Self-pay | Admitting: Internal Medicine

## 2019-02-23 ENCOUNTER — Telehealth: Payer: Self-pay | Admitting: Internal Medicine

## 2019-02-23 DIAGNOSIS — IMO0002 Reserved for concepts with insufficient information to code with codable children: Secondary | ICD-10-CM

## 2019-02-23 DIAGNOSIS — E1151 Type 2 diabetes mellitus with diabetic peripheral angiopathy without gangrene: Secondary | ICD-10-CM

## 2019-02-23 MED ORDER — TOUJEO MAX SOLOSTAR 300 UNIT/ML ~~LOC~~ SOPN: 50.0000 [IU] | PEN_INJECTOR | Freq: Every day | SUBCUTANEOUS | 1 refills | Status: DC

## 2019-02-23 MED ORDER — BD PEN NEEDLE NANO 2ND GEN 32G X 4 MM MISC: 1 refills | Status: DC

## 2019-02-23 NOTE — Telephone Encounter (Signed)
Pt informed rx was changed from [place] to [employer].   Pt informed letter has been printed, signed and faxed.

## 2019-02-23 NOTE — Telephone Encounter (Signed)
Copied from [place] [id number]. Topic: Quick Communication - See Telephone Encounter >> Feb 22, 2019  4:47 PM Erick Blinks wrote: Needs at least 2 more weeks out. She needs insulin refill

## 2019-02-23 NOTE — Telephone Encounter (Signed)
PA for Tyler Aas was denied.

## 2019-03-06 DIAGNOSIS — Z5189 Encounter for other specified aftercare: Secondary | ICD-10-CM | POA: Diagnosis not present

## 2019-03-07 ENCOUNTER — Telehealth: Payer: Self-pay | Admitting: Internal Medicine

## 2019-03-07 DIAGNOSIS — I70213 Atherosclerosis of native arteries of extremities with intermittent claudication, bilateral legs: Secondary | ICD-10-CM | POA: Diagnosis not present

## 2019-03-07 DIAGNOSIS — A4150 Gram-negative sepsis, unspecified: Secondary | ICD-10-CM | POA: Diagnosis not present

## 2019-03-07 DIAGNOSIS — N7689 Other specified inflammation of vagina and vulva: Secondary | ICD-10-CM | POA: Diagnosis not present

## 2019-03-07 DIAGNOSIS — S3140XD Unspecified open wound of vagina and vulva, subsequent encounter: Secondary | ICD-10-CM | POA: Diagnosis not present

## 2019-03-07 DIAGNOSIS — A4101 Sepsis due to Methicillin susceptible Staphylococcus aureus: Secondary | ICD-10-CM | POA: Diagnosis not present

## 2019-03-07 DIAGNOSIS — L0232 Furuncle of buttock: Secondary | ICD-10-CM | POA: Diagnosis not present

## 2019-03-07 DIAGNOSIS — Z466 Encounter for fitting and adjustment of urinary device: Secondary | ICD-10-CM | POA: Diagnosis not present

## 2019-03-07 DIAGNOSIS — B379 Candidiasis, unspecified: Secondary | ICD-10-CM | POA: Diagnosis not present

## 2019-03-07 DIAGNOSIS — N764 Abscess of vulva: Secondary | ICD-10-CM | POA: Diagnosis not present

## 2019-03-07 DIAGNOSIS — J1289 Other viral pneumonia: Secondary | ICD-10-CM | POA: Diagnosis not present

## 2019-03-07 DIAGNOSIS — E1151 Type 2 diabetes mellitus with diabetic peripheral angiopathy without gangrene: Secondary | ICD-10-CM | POA: Diagnosis not present

## 2019-03-07 DIAGNOSIS — U071 COVID-19: Secondary | ICD-10-CM | POA: Diagnosis not present

## 2019-03-07 DIAGNOSIS — T797XXD Traumatic subcutaneous emphysema, subsequent encounter: Secondary | ICD-10-CM | POA: Diagnosis not present

## 2019-03-07 DIAGNOSIS — A4151 Sepsis due to Escherichia coli [E. coli]: Secondary | ICD-10-CM | POA: Diagnosis not present

## 2019-03-07 DIAGNOSIS — J80 Acute respiratory distress syndrome: Secondary | ICD-10-CM | POA: Diagnosis not present

## 2019-03-07 DIAGNOSIS — A409 Streptococcal sepsis, unspecified: Secondary | ICD-10-CM | POA: Diagnosis not present

## 2019-03-07 NOTE — Telephone Encounter (Signed)
Copied from [place] [id number]. Topic: General - Other >> Mar 07, 2019 10:32 AM Leward Quan A wrote: Reason for CRM: Nira Conn with [facility] called to say that patient asked to be discharged after visit in 03/09/2019 due to her surgeon giving her the ok to return to work on Monday 03/13/2019 and her wound is almost closed. Any questions please call Ph# [phone]

## 2019-03-07 NOTE — Telephone Encounter (Signed)
Noted  

## 2019-03-19 ENCOUNTER — Encounter: Payer: Self-pay | Admitting: Internal Medicine

## 2019-03-29 ENCOUNTER — Ambulatory Visit: Payer: BC Managed Care – PPO | Admitting: Internal Medicine

## 2019-03-29 DIAGNOSIS — Z0289 Encounter for other administrative examinations: Secondary | ICD-10-CM

## 2019-05-19 ENCOUNTER — Other Ambulatory Visit: Payer: Self-pay | Admitting: Internal Medicine

## 2019-05-19 DIAGNOSIS — IMO0002 Reserved for concepts with insufficient information to code with codable children: Secondary | ICD-10-CM

## 2019-05-19 DIAGNOSIS — E1151 Type 2 diabetes mellitus with diabetic peripheral angiopathy without gangrene: Secondary | ICD-10-CM

## 2019-09-12 ENCOUNTER — Other Ambulatory Visit: Payer: Self-pay | Admitting: Internal Medicine

## 2019-09-12 DIAGNOSIS — E1151 Type 2 diabetes mellitus with diabetic peripheral angiopathy without gangrene: Secondary | ICD-10-CM

## 2019-09-12 DIAGNOSIS — IMO0002 Reserved for concepts with insufficient information to code with codable children: Secondary | ICD-10-CM

## 2019-09-12 MED ORDER — FARXIGA 10 MG PO TABS: 10.0000 mg | ORAL_TABLET | Freq: Every day | ORAL | 0 refills | Status: DC

## 2019-09-12 MED ORDER — TOUJEO MAX SOLOSTAR 300 UNIT/ML ~~LOC~~ SOPN: 50.0000 [IU] | PEN_INJECTOR | Freq: Every day | SUBCUTANEOUS | 0 refills | Status: DC

## 2019-09-12 NOTE — Telephone Encounter (Signed)
Medication Refill - Medication: Insulin Glargine, 2 Unit Dial, (TOUJEO MAX SOLOSTAR) 300 UNIT/ML SOPN dapagliflozin propanediol (FARXIGA) 10 MG TABS tablet  Has the patient contacted their pharmacy? No - has new insurance and needs new script sent (Agent: If no, request that the patient contact the pharmacy for the refill.) (Agent: If yes, when and what did the pharmacy advise?)  Preferred Pharmacy (with phone number or street name):  [id number] - [place], [place] Phone:  [phone]  Fax:  [phone]     Agent: Please be advised that RX refills may take up to 3 business days. We ask that you follow-up with your pharmacy.

## 2019-09-12 NOTE — Telephone Encounter (Signed)
Pt needs to schedule appt for further refills. Courtesy refill given. Requested Prescriptions  Pending Prescriptions Disp Refills  . dapagliflozin propanediol (FARXIGA) 10 MG TABS tablet 30 tablet 0    Sig: Take 10 mg by mouth daily.     Endocrinology:  Diabetes - SGLT2 Inhibitors Failed - 09/12/2019  3:03 PM      Failed - LDL in normal range and within 360 days    LDL Cholesterol  Date Value Ref Range Status  02/28/2018 106 (H) 0 - 99 mg/dL Final         Failed - HBA1C is between 0 and 7.9 and within 180 days    Hgb A1c MFr Bld  Date Value Ref Range Status  01/23/2019 11.3 (H) 4.8 - 5.6 % Final    Comment:    (NOTE) Pre diabetes:          5.7%-6.4% Diabetes:              >6.4% Glycemic control for   <7.0% adults with diabetes          Failed - Valid encounter within last 6 months    Recent Outpatient Visits          7 months ago Uncontrolled diabetes mellitus type 2 with atherosclerosis of arteries of extremities ([place])   [facility] -Mayer Camel, MD   8 months ago Panic anxiety syndrome   [facility] -Mayer Camel, MD   1 year ago Controlled type 2 diabetes mellitus with complication, with long-term current use of insulin ([place])   [place], Thomas L, MD   1 year ago Thyroid enlargement   [place], Marvis Repress, FNP   2 years ago Controlled type 2 diabetes mellitus with complication, with long-term current use of insulin [facility])   [facility] -Mayer Camel, MD             Passed - Cr in normal range and within 360 days    Creat  Date Value Ref Range Status  03/23/2014 0.54 0.50 - 1.10 mg/dL Final   Creatinine, Ser  Date Value Ref Range Status  02/05/2019 0.92 0.44 - 1.00 mg/dL Final   Creatinine,U  Date Value Ref Range Status  06/23/2017 56.0 mg/dL Final         Passed - eGFR in normal range and within 360  days    GFR calc Af Amer  Date Value Ref Range Status  02/05/2019 >60 >60 mL/min Final   GFR calc non Af Amer  Date Value Ref Range Status  02/05/2019 >60 >60 mL/min Final   GFR  Date Value Ref Range Status  02/28/2018 191.77 >60.00 mL/min Final         . Insulin Glargine, 2 Unit Dial, (TOUJEO MAX SOLOSTAR) 300 UNIT/ML SOPN 9 mL 0    Sig: Inject 50 Units into the skin daily.     Endocrinology:  Diabetes - Insulins Failed - 09/12/2019  3:03 PM      Failed - HBA1C is between 0 and 7.9 and within 180 days    Hgb A1c MFr Bld  Date Value Ref Range Status  01/23/2019 11.3 (H) 4.8 - 5.6 % Final    Comment:    (NOTE) Pre diabetes:          5.7%-6.4% Diabetes:              >6.4% Glycemic  control for   <7.0% adults with diabetes          Failed - Valid encounter within last 6 months    Recent Outpatient Visits          7 months ago Uncontrolled diabetes mellitus type 2 with atherosclerosis of arteries of extremities ([place])   [facility] -Mayer Camel, MD   8 months ago Panic anxiety syndrome   [facility] -Mayer Camel, MD   1 year ago Controlled type 2 diabetes mellitus with complication, with long-term current use of insulin [facility])   [place], Thomas L, MD   1 year ago Thyroid enlargement   [place], Marvis Repress, FNP   2 years ago Controlled type 2 diabetes mellitus with complication, with long-term current use of insulin [facility])   [facility] -Mayer Camel, MD

## 2019-10-09 ENCOUNTER — Other Ambulatory Visit: Payer: Self-pay | Admitting: Internal Medicine

## 2019-10-09 DIAGNOSIS — IMO0002 Reserved for concepts with insufficient information to code with codable children: Secondary | ICD-10-CM

## 2019-10-09 DIAGNOSIS — E1151 Type 2 diabetes mellitus with diabetic peripheral angiopathy without gangrene: Secondary | ICD-10-CM

## 2019-11-10 ENCOUNTER — Encounter: Payer: Self-pay | Admitting: Internal Medicine

## 2019-11-10 ENCOUNTER — Ambulatory Visit (INDEPENDENT_AMBULATORY_CARE_PROVIDER_SITE_OTHER): Payer: Commercial Managed Care - PPO | Admitting: Internal Medicine

## 2019-11-10 ENCOUNTER — Ambulatory Visit (INDEPENDENT_AMBULATORY_CARE_PROVIDER_SITE_OTHER): Payer: Commercial Managed Care - PPO

## 2019-11-10 ENCOUNTER — Other Ambulatory Visit: Payer: Self-pay

## 2019-11-10 VITALS — BP 138/84 | HR 93 | Temp 98.1°F | Ht 65.5 in | Wt 223.2 lb

## 2019-11-10 DIAGNOSIS — R05 Cough: Secondary | ICD-10-CM

## 2019-11-10 DIAGNOSIS — I2510 Atherosclerotic heart disease of native coronary artery without angina pectoris: Secondary | ICD-10-CM

## 2019-11-10 DIAGNOSIS — D500 Iron deficiency anemia secondary to blood loss (chronic): Secondary | ICD-10-CM

## 2019-11-10 DIAGNOSIS — I70209 Unspecified atherosclerosis of native arteries of extremities, unspecified extremity: Secondary | ICD-10-CM

## 2019-11-10 DIAGNOSIS — Z124 Encounter for screening for malignant neoplasm of cervix: Secondary | ICD-10-CM

## 2019-11-10 DIAGNOSIS — E785 Hyperlipidemia, unspecified: Secondary | ICD-10-CM

## 2019-11-10 DIAGNOSIS — D539 Nutritional anemia, unspecified: Secondary | ICD-10-CM | POA: Diagnosis not present

## 2019-11-10 DIAGNOSIS — IMO0002 Reserved for concepts with insufficient information to code with codable children: Secondary | ICD-10-CM

## 2019-11-10 DIAGNOSIS — E1165 Type 2 diabetes mellitus with hyperglycemia: Secondary | ICD-10-CM | POA: Diagnosis not present

## 2019-11-10 DIAGNOSIS — Z0000 Encounter for general adult medical examination without abnormal findings: Secondary | ICD-10-CM

## 2019-11-10 DIAGNOSIS — Z1231 Encounter for screening mammogram for malignant neoplasm of breast: Secondary | ICD-10-CM

## 2019-11-10 DIAGNOSIS — R059 Cough, unspecified: Secondary | ICD-10-CM | POA: Insufficient documentation

## 2019-11-10 DIAGNOSIS — J4540 Moderate persistent asthma, uncomplicated: Secondary | ICD-10-CM

## 2019-11-10 DIAGNOSIS — F410 Panic disorder [episodic paroxysmal anxiety] without agoraphobia: Secondary | ICD-10-CM

## 2019-11-10 DIAGNOSIS — E1151 Type 2 diabetes mellitus with diabetic peripheral angiopathy without gangrene: Secondary | ICD-10-CM | POA: Diagnosis not present

## 2019-11-10 DIAGNOSIS — J45909 Unspecified asthma, uncomplicated: Secondary | ICD-10-CM

## 2019-11-10 LAB — LIPID PANEL
Cholesterol: 147 mg/dL (ref 0–200)
HDL: 30.7 mg/dL — ABNORMAL LOW (ref >39.00)
LDL Cholesterol: 97 mg/dL (ref 0–99)
NonHDL: 116.53
Total CHOL/HDL Ratio: 5
Triglycerides: 96 mg/dL (ref 0.0–149.0)
VLDL: 19.2 mg/dL (ref 0.0–40.0)

## 2019-11-10 LAB — CBC WITH DIFFERENTIAL/PLATELET
Basophils Absolute: 0.1 10*3/uL (ref 0.0–0.1)
Basophils Relative: 0.7 % (ref 0.0–3.0)
Eosinophils Absolute: 0.2 10*3/uL (ref 0.0–0.7)
Eosinophils Relative: 1.9 % (ref 0.0–5.0)
HCT: 35.1 % — ABNORMAL LOW (ref 36.0–46.0)
Hemoglobin: 11.7 g/dL — ABNORMAL LOW (ref 12.0–15.0)
Lymphocytes Relative: 35.4 % (ref 12.0–46.0)
Lymphs Abs: 4.1 10*3/uL — ABNORMAL HIGH (ref 0.7–4.0)
MCHC: 33.3 g/dL (ref 30.0–36.0)
MCV: 82 fl (ref 78.0–100.0)
Monocytes Absolute: 0.7 10*3/uL (ref 0.1–1.0)
Monocytes Relative: 6.2 % (ref 3.0–12.0)
Neutro Abs: 6.4 10*3/uL (ref 1.4–7.7)
Neutrophils Relative %: 55.8 % (ref 43.0–77.0)
Platelets: 404 10*3/uL — ABNORMAL HIGH (ref 150.0–400.0)
RBC: 4.28 Mil/uL (ref 3.87–5.11)
RDW: 14.3 % (ref 11.5–15.5)
WBC: 11.5 10*3/uL — ABNORMAL HIGH (ref 4.0–10.5)

## 2019-11-10 LAB — VITAMIN B12: Vitamin B-12: >1500 pg/mL — ABNORMAL HIGH (ref 211–911)

## 2019-11-10 LAB — BASIC METABOLIC PANEL
BUN: 13 mg/dL (ref 6–23)
CO2: 26 mEq/L (ref 19–32)
Calcium: 9.2 mg/dL (ref 8.4–10.5)
Chloride: 106 mEq/L (ref 96–112)
Creatinine, Ser: 1.16 mg/dL (ref 0.40–1.20)
GFR: 61.55 mL/min (ref >60.00)
Glucose, Bld: 83 mg/dL (ref 70–99)
Potassium: 3.6 mEq/L (ref 3.5–5.1)
Sodium: 139 mEq/L (ref 135–145)

## 2019-11-10 LAB — TSH: TSH: 1.21 u[IU]/mL (ref 0.35–4.50)

## 2019-11-10 LAB — POCT GLYCOSYLATED HEMOGLOBIN (HGB A1C): Hemoglobin A1C: 6.5 % — AB (ref 4.0–5.6)

## 2019-11-10 LAB — MICROALBUMIN / CREATININE URINE RATIO
Creatinine,U: 65 mg/dL
Microalb Creat Ratio: 24.3 mg/g (ref 0.0–30.0)
Microalb, Ur: 15.8 mg/dL — ABNORMAL HIGH (ref 0.0–1.9)

## 2019-11-10 LAB — HEPATIC FUNCTION PANEL
ALT: 13 U/L (ref 0–35)
AST: 12 U/L (ref 0–37)
Albumin: 3.9 g/dL (ref 3.5–5.2)
Alkaline Phosphatase: 96 U/L (ref 39–117)
Bilirubin, Direct: 0 mg/dL (ref 0.0–0.3)
Total Bilirubin: 0.3 mg/dL (ref 0.2–1.2)
Total Protein: 7.3 g/dL (ref 6.0–8.3)

## 2019-11-10 LAB — URINALYSIS, ROUTINE W REFLEX MICROSCOPIC
Bilirubin Urine: NEGATIVE
Hgb urine dipstick: NEGATIVE
Ketones, ur: NEGATIVE
Nitrite: NEGATIVE
Specific Gravity, Urine: 1.02 (ref 1.000–1.030)
Urine Glucose: >=1000 — AB
Urobilinogen, UA: 0.2 (ref 0.0–1.0)
pH: 5.5 (ref 5.0–8.0)

## 2019-11-10 LAB — FOLATE: Folate: 18.6 ng/mL (ref >5.9)

## 2019-11-10 LAB — IBC PANEL
Iron: 27 ug/dL — ABNORMAL LOW (ref 42–145)
Saturation Ratios: 9.7 % — ABNORMAL LOW (ref 20.0–50.0)
Transferrin: 198 mg/dL — ABNORMAL LOW (ref 212.0–360.0)

## 2019-11-10 LAB — FERRITIN: Ferritin: 20.8 ng/mL (ref 10.0–291.0)

## 2019-11-10 MED ORDER — TRELEGY ELLIPTA 100-62.5-25 MCG/INH IN AEPB: 1.0000 | INHALATION_SPRAY | Freq: Every day | RESPIRATORY_TRACT | 1 refills | Status: DC

## 2019-11-10 MED ORDER — ATORVASTATIN CALCIUM 20 MG PO TABS: 20.0000 mg | ORAL_TABLET | Freq: Every day | ORAL | 1 refills | Status: DC

## 2019-11-10 MED ORDER — FARXIGA 10 MG PO TABS: 10.0000 mg | ORAL_TABLET | Freq: Every day | ORAL | 1 refills | Status: DC

## 2019-11-10 NOTE — Patient Instructions (Signed)
Health Maintenance, Female Adopting a healthy lifestyle and getting preventive care are important in promoting health and wellness. Ask your health care provider about:  The right schedule for you to have regular tests and exams.  Things you can do on your own to prevent diseases and keep yourself healthy. What should I know about diet, weight, and exercise? Eat a healthy diet   Eat a diet that includes plenty of vegetables, fruits, low-fat dairy products, and lean protein.  Do not eat a lot of foods that are high in solid fats, added sugars, or sodium. Maintain a healthy weight Body mass index (BMI) is used to identify weight problems. It estimates body fat based on height and weight. Your health care provider can help determine your BMI and help you achieve or maintain a healthy weight. Get regular exercise Get regular exercise. This is one of the most important things you can do for your health. Most adults should:  Exercise for at least 150 minutes each week. The exercise should increase your heart rate and make you sweat (moderate-intensity exercise).  Do strengthening exercises at least twice a week. This is in addition to the moderate-intensity exercise.  Spend less time sitting. Even light physical activity can be beneficial. Watch cholesterol and blood lipids Have your blood tested for lipids and cholesterol at 44 years of age, then have this test every 5 years. Have your cholesterol levels checked more often if:  Your lipid or cholesterol levels are high.  You are older than 44 years of age.  You are at high risk for heart disease. What should I know about cancer screening? Depending on your health history and family history, you may need to have cancer screening at various ages. This may include screening for:  Breast cancer.  Cervical cancer.  Colorectal cancer.  Skin cancer.  Lung cancer. What should I know about heart disease, diabetes, and high blood  pressure? Blood pressure and heart disease  High blood pressure causes heart disease and increases the risk of stroke. This is more likely to develop in people who have high blood pressure readings, are of African descent, or are overweight.  Have your blood pressure checked: ? Every 3-5 years if you are 18-39 years of age. ? Every year if you are 40 years old or older. Diabetes Have regular diabetes screenings. This checks your fasting blood sugar level. Have the screening done:  Once every three years after age 40 if you are at a normal weight and have a low risk for diabetes.  More often and at a younger age if you are overweight or have a high risk for diabetes. What should I know about preventing infection? Hepatitis B If you have a higher risk for hepatitis B, you should be screened for this virus. Talk with your health care provider to find out if you are at risk for hepatitis B infection. Hepatitis C Testing is recommended for:  Everyone born from 1945 through 1965.  Anyone with known risk factors for hepatitis C. Sexually transmitted infections (STIs)  Get screened for STIs, including gonorrhea and chlamydia, if: ? You are sexually active and are younger than 44 years of age. ? You are older than 44 years of age and your health care provider tells you that you are at risk for this type of infection. ? Your sexual activity has changed since you were last screened, and you are at increased risk for chlamydia or gonorrhea. Ask your health care provider if   you are at risk.  Ask your health care provider about whether you are at high risk for HIV. Your health care provider may recommend a prescription medicine to help prevent HIV infection. If you choose to take medicine to prevent HIV, you should first get tested for HIV. You should then be tested every 3 months for as long as you are taking the medicine. Pregnancy  If you are about to stop having your period (premenopausal) and  you may become pregnant, seek counseling before you get pregnant.  Take 400 to 800 micrograms (mcg) of folic acid every day if you become pregnant.  Ask for birth control (contraception) if you want to prevent pregnancy. Osteoporosis and menopause Osteoporosis is a disease in which the bones lose minerals and strength with aging. This can result in bone fractures. If you are 65 years old or older, or if you are at risk for osteoporosis and fractures, ask your health care provider if you should:  Be screened for bone loss.  Take a calcium or vitamin D supplement to lower your risk of fractures.  Be given hormone replacement therapy (HRT) to treat symptoms of menopause. Follow these instructions at home: Lifestyle  Do not use any products that contain nicotine or tobacco, such as cigarettes, e-cigarettes, and chewing tobacco. If you need help quitting, ask your health care provider.  Do not use street drugs.  Do not share needles.  Ask your health care provider for help if you need support or information about quitting drugs. Alcohol use  Do not drink alcohol if: ? Your health care provider tells you not to drink. ? You are pregnant, may be pregnant, or are planning to become pregnant.  If you drink alcohol: ? Limit how much you use to 0-1 drink a day. ? Limit intake if you are breastfeeding.  Be aware of how much alcohol is in your drink. In the [place] one drink equals one 12 oz bottle of beer (355 mL), one 5 oz glass of wine (148 mL), or one 1 oz glass of hard liquor (44 mL). General instructions  Schedule regular health, dental, and eye exams.  Stay current with your vaccines.  Tell your health care provider if: ? You often feel depressed. ? You have ever been abused or do not feel safe at home. Summary  Adopting a healthy lifestyle and getting preventive care are important in promoting health and wellness.  Follow your health care provider's instructions about healthy  diet, exercising, and getting tested or screened for diseases.  Follow your health care provider's instructions on monitoring your cholesterol and blood pressure. This information is not intended to replace advice given to you by your health care provider. Make sure you discuss any questions you have with your health care provider. Document Revised: 08/17/2018 Document Reviewed: 08/17/2018 Elsevier Patient Education  2020 Elsevier Inc.  

## 2019-11-10 NOTE — Progress Notes (Signed)
Subjective:  Patient ID: Maureen Price, female    DOB: Sep 18, 1975  Age: 44 y.o. MRN: [id number]  CC: Anemia, Annual Exam, Hyperlipidemia, and Diabetes  This visit occurred during the SARS-CoV-2 public health emergency.  Safety protocols were in place, including screening questions prior to the visit, additional usage of staff PPE, and extensive cleaning of exam room while observing appropriate contact time as indicated for disinfecting solutions.    HPI Maureen Price presents for a CPX.  She complains of chronic, nonproductive cough and intermittent wheezing.  She continues to smoke cigarettes.  She denies hemoptysis, chest pain, shortness of breath, dyspnea on exertion, palpitations, edema, or fatigue.  She has been working on her lifestyle modifications to control her blood sugars.  She tells me her blood sugars have been well controlled with a combination of insulin and an SGLT2 inhibitor.  Outpatient Medications Prior to Visit  Medication Sig Dispense Refill  . aspirin EC 81 MG tablet Take 1 tablet (81 mg total) by mouth daily. 90 tablet 1  . blood glucose meter kit and supplies Dispense based on patient and insurance preference. Use up to four times daily as directed. (FOR ICD-10 E10.9, E11.9). 1 each 0  . Insulin Glargine, 2 Unit Dial, (TOUJEO MAX SOLOSTAR) 300 UNIT/ML SOPN Inject 50 Units into the skin daily. 9 mL 0  . Insulin Pen Needle (BD PEN NEEDLE NANO 2ND GEN) 32G X 4 MM MISC Use to inject insulin once per day. DX E11.8 90 each 1  . Multiple Vitamin (MULTI-VITAMIN DAILY PO) Take 1 tablet by mouth daily.    . nitroGLYCERIN (NITROSTAT) 0.4 MG SL tablet Place 1 tablet (0.4 mg total) under the tongue every 5 (five) minutes as needed for chest pain (Hold for SBP <100). 30 tablet 2  . vitamin B-12 (CYANOCOBALAMIN) 1000 MCG tablet Take 2,000 mcg by mouth daily.    . vitamin C (ASCORBIC ACID) 500 MG tablet Take 1,000 mg by mouth daily.    Marland Kitchen ALPRAZolam (XANAX) 0.5 MG tablet  Take 1 tablet (0.5 mg total) by mouth 2 (two) times daily as needed for anxiety. 60 tablet 1  . dapagliflozin propanediol (FARXIGA) 10 MG TABS tablet Take 10 mg by mouth daily. 30 tablet 0  . Exenatide ER (BYDUREON BCISE) 2 MG/0.85ML AUIJ Inject 1 Act into the skin once a week. 12 pen 0  . Omega-3 Fatty Acids (FISH OIL PO) Take 1 tablet by mouth daily.    . Potassium 75 MG TABS Take 1 tablet by mouth daily.    . caspofungin (CANCIDAS) IVPB Inject 50 mg into the vein daily. Indication: Fournier's gangrene Last Day of Therapy:  02/13/19  Labs - Once weekly:  CBC/D and BMP Labs - Every other week:  ESR and CRP 7 Units 0  . meropenem (MERREM) IVPB Inject 2 g into the vein every 8 (eight) hours. Indication:  Fournier's gangrene  Last Day of Therapy:  02/13/19 Labs - Once weekly:  CBC/D and BMP Labs - Every other week:  ESR and CRP 23 Units 0   Facility-Administered Medications Prior to Visit  Medication Dose Route Frequency Provider Last Rate Last Admin  . fentaNYL (SUBLIMAZE) injection    Anesthesia Intra-op Suzy Bouchard, CRNA   50 mcg at 01/23/19 1308    ROS Review of Systems  Constitutional: Negative.  Negative for appetite change, diaphoresis, fatigue and unexpected weight change.  HENT: Negative.   Eyes: Negative for visual disturbance.  Respiratory: Positive for cough and  wheezing. Negative for chest tightness and shortness of breath.   Cardiovascular: Negative for chest pain, palpitations and leg swelling.  Gastrointestinal: Negative for abdominal pain, constipation, diarrhea, nausea and vomiting.  Endocrine: Negative.  Negative for polydipsia, polyphagia and polyuria.  Genitourinary: Negative.  Negative for difficulty urinating and dysuria.  Musculoskeletal: Negative for arthralgias, back pain, joint swelling and myalgias.  Skin: Negative for color change, pallor and rash.  Neurological: Negative.  Negative for dizziness, weakness, light-headedness and headaches.  Hematological:  Negative for adenopathy. Does not bruise/bleed easily.  Psychiatric/Behavioral: Negative.     Objective:  BP 138/84 (BP Location: Right Arm, Patient Position: Sitting, Cuff Size: Large)   Pulse 93   Temp 98.1 F (36.7 C) (Oral)   Ht 5' 5.5" (1.664 m)   Wt 223 lb 3.2 oz (101.2 kg)   LMP 11/01/2019 Comment: s/p BTL  SpO2 96%   BMI 36.58 kg/m   BP Readings from Last 3 Encounters:  11/10/19 138/84  02/06/19 131/69  11/04/18 130/78    Wt Readings from Last 3 Encounters:  11/10/19 223 lb 3.2 oz (101.2 kg)  01/30/19 254 lb 10.1 oz (115.5 kg)  11/04/18 220 lb (99.8 kg)    Physical Exam Vitals reviewed.  Constitutional:      Appearance: Normal appearance.  HENT:     Nose: Nose normal.     Mouth/Throat:     Mouth: Mucous membranes are moist.  Eyes:     General: No scleral icterus.    Conjunctiva/sclera: Conjunctivae normal.  Cardiovascular:     Rate and Rhythm: Normal rate and regular rhythm.     Heart sounds: No murmur.  Pulmonary:     Effort: Pulmonary effort is normal.     Breath sounds: No stridor. No wheezing, rhonchi or rales.  Abdominal:     General: Abdomen is flat. Bowel sounds are normal. There is no distension.     Palpations: Abdomen is soft. There is no hepatomegaly or splenomegaly.     Tenderness: There is no abdominal tenderness.  Musculoskeletal:        General: Normal range of motion.     Cervical back: Neck supple.     Right lower leg: No edema.     Left lower leg: No edema.  Lymphadenopathy:     Cervical: No cervical adenopathy.  Skin:    General: Skin is warm and dry.     Coloration: Skin is not pale.  Neurological:     General: No focal deficit present.     Mental Status: She is alert.  Psychiatric:        Mood and Affect: Mood normal.        Behavior: Behavior normal.     Lab Results  Component Value Date   WBC 11.5 (H) 11/10/2019   HGB 11.7 (L) 11/10/2019   HCT 35.1 (L) 11/10/2019   PLT 404.0 (H) 11/10/2019   GLUCOSE 83 11/10/2019    CHOL 147 11/10/2019   TRIG 96.0 11/10/2019   HDL 30.70 (L) 11/10/2019   LDLCALC 97 11/10/2019   ALT 13 11/10/2019   AST 12 11/10/2019   NA 139 11/10/2019   K 3.6 11/10/2019   CL 106 11/10/2019   CREATININE 1.16 11/10/2019   BUN 13 11/10/2019   CO2 26 11/10/2019   TSH 1.21 11/10/2019   INR 1.3 (H) 01/23/2019   HGBA1C 6.5 (A) 11/10/2019   MICROALBUR 15.8 (H) 11/10/2019    CT ABDOMEN PELVIS W CONTRAST  Result Date: 01/22/2019 CLINICAL DATA:  Perirectal pain and swelling EXAM: CT ABDOMEN AND PELVIS WITH CONTRAST TECHNIQUE: Multidetector CT imaging of the abdomen and pelvis was performed using the standard protocol following bolus administration of intravenous contrast. CONTRAST:  146m OMNIPAQUE IOHEXOL 300 MG/ML  SOLN COMPARISON:  None. FINDINGS: Lower chest: No acute abnormality. Hepatobiliary: No focal liver abnormality is seen. Status post cholecystectomy. No biliary dilatation. Pancreas: Unremarkable. No pancreatic ductal dilatation or surrounding inflammatory changes. Spleen: Normal in size without focal abnormality. Adrenals/Urinary Tract: Adrenal glands are unremarkable. Kidneys are normal, without renal calculi, focal lesion, or hydronephrosis. Bladder is unremarkable. Stomach/Bowel: Stomach is within normal limits. Appendix appears normal. No evidence of bowel wall thickening, distention, or inflammatory changes. Vascular/Lymphatic: Calcific atherosclerosis. No enlarged abdominal or pelvic lymph nodes. Reproductive: No mass or other abnormality. Other: No abdominal wall hernia or abnormality. No abdominopelvic ascites. There is subcutaneous emphysema in the soft tissues about the right aspect of the anus, perineum, and posterior right vulva (series 12, image 5, series 14, image 73). No discrete fluid collections appreciated. Musculoskeletal: No acute or significant osseous findings. IMPRESSION: There is subcutaneous emphysema in the soft tissues about the right aspect of the anus,  perineum, and posterior right vulva (series 12, image 5, series 14, image 73). No discrete fluid collections appreciated. Findings are concerning for gas-forming infection (Fournier's gangrene). Recommend surgical consultation. Electronically Signed   By: AEddie CandleM.D.   On: 01/22/2019 23:14   DG Chest Port 1 View  Result Date: 01/23/2019 CLINICAL DATA:  Fevers and positive COVID-19 EXAM: PORTABLE CHEST 1 VIEW COMPARISON:  09/19/2016 FINDINGS: Cardiac shadows within normal limits. The overall inspiratory effort is poor. Some patchy increased density is noted in the right upper lobe which could be related to mild ground-glass infiltrate. No sizable effusion is seen. No bony abnormality is noted. IMPRESSION: Patchy right upper lobe infiltrate. Electronically Signed   By: MInez CatalinaM.D.   On: 01/23/2019 07:22   DG Chest 2 View  Result Date: 11/10/2019 CLINICAL DATA:  Cough, follow-up COVID EXAM: CHEST - 2 VIEW COMPARISON:  01/29/2019 FINDINGS: Chronic interstitial prominence similar to more remote studies. No focal consolidation or edema. No pleural effusion. Cardiomediastinal contours are within normal limits. There is no acute osseous abnormality. IMPRESSION: No acute process in the chest. Chronic interstitial prominence is similar to more remote studies. Electronically Signed   By: PMacy MisM.D.   On: 11/10/2019 16:55     Assessment & Plan:   [place] seen today for anemia, annual exam, hyperlipidemia and diabetes.  Diagnoses and all orders for this visit:  Uncontrolled diabetes mellitus type 2 with atherosclerosis of arteries of extremities ([place]- Her A1c is at 6.5%.  Her blood sugars are adequately well controlled. -     Basic metabolic panel -     Urinalysis, Routine w reflex microscopic -     Microalbumin / creatinine urine ratio -     HM Diabetes Foot Exam -     POCT glycosylated hemoglobin (Hb A1C) -     dapagliflozin propanediol (FARXIGA) 10 MG TABS tablet; Take 10 mg by  mouth daily.  Routine general medical examination at a health care facility- Exam completed, labs reviewed, vaccines reviewed and updated, she is referred for screening mammogram and cervical cancer screening, patient education was given. -     Lipid panel  Coronary artery disease involving native coronary artery of native heart without angina pectoris- She has had no recent episodes of angina.  Will continue to work  on risk factor modifications.  I have asked her to start taking a statin.  I think she is also due for follow-up with cardiology. -     Ambulatory referral to Cardiology -     atorvastatin (LIPITOR) 20 MG tablet; Take 1 tablet (20 mg total) by mouth daily.  Uncomplicated asthma, unspecified asthma severity, unspecified whether persistent  Panic anxiety syndrome  Cough- Chest x-ray is negative for mass or infiltrate.  I will treat her for asthma. -     DG Chest 2 View; Future  Deficiency anemia- She is anemic and her iron level is low.  I recommended that she receive a series of iron infusions.  Her white cell count is mildly elevated.  This is consistent with tobacco abuse. -     IBC panel -     CBC with Differential/Platelet -     Vitamin B12 -     Vitamin B1 -     Folate -     Ferritin  Dyslipidemia, goal LDL below 70- She has not achieved her LDL goal.  I have asked her to start taking a statin for CV risk reduction. -     TSH -     Hepatic function panel -     atorvastatin (LIPITOR) 20 MG tablet; Take 1 tablet (20 mg total) by mouth daily.  Visit for screening mammogram -     MM DIGITAL SCREENING BILATERAL; Future  Cervical cancer screening -     Ambulatory referral to Gynecology  Moderate persistent asthma without complication- I recommended that she start treating this with triple therapy.  I gave her samples of Trelegy. -     Fluticasone-Umeclidin-Vilant (TRELEGY ELLIPTA) 100-62.5-25 MCG/INH AEPB; Inhale 1 puff into the lungs daily.  Iron deficiency anemia  due to chronic blood loss- I recommended treating this with a series of iron infusions.   I have discontinued Denece D. Bissette's Omega-3 Fatty Acids (FISH OIL PO), Potassium, ALPRAZolam, Exenatide ER, caspofungin, and meropenem. I am also having her start on Trelegy Ellipta and atorvastatin. Additionally, I am having her maintain her aspirin EC, nitroGLYCERIN, vitamin B-12, Multiple Vitamin (MULTI-VITAMIN DAILY PO), vitamin C, blood glucose meter kit and supplies, BD Pen Needle Nano 2nd Gen, Toujeo Max [place], and [place].  Meds ordered this encounter  Medications  . dapagliflozin propanediol (FARXIGA) 10 MG TABS tablet    Sig: Take 10 mg by mouth daily.    Dispense:  90 tablet    Refill:  1  . Fluticasone-Umeclidin-Vilant (TRELEGY ELLIPTA) 100-62.5-25 MCG/INH AEPB    Sig: Inhale 1 puff into the lungs daily.    Dispense:  90 each    Refill:  1  . atorvastatin (LIPITOR) 20 MG tablet    Sig: Take 1 tablet (20 mg total) by mouth daily.    Dispense:  90 tablet    Refill:  1     Follow-up: Return in about 3 months (around 02/10/2020).  [name], MD

## 2019-11-14 LAB — VITAMIN B1: Vitamin B1 (Thiamine): 11 nmol/L (ref 8–30)

## 2019-11-21 ENCOUNTER — Encounter: Payer: Self-pay | Admitting: Internal Medicine

## 2019-11-21 ENCOUNTER — Encounter: Payer: Self-pay | Admitting: General Practice

## 2019-12-06 ENCOUNTER — Encounter (HOSPITAL_COMMUNITY): Payer: Self-pay

## 2019-12-06 ENCOUNTER — Ambulatory Visit (HOSPITAL_COMMUNITY)
Admission: EM | Admit: 2019-12-06 | Discharge: 2019-12-06 | Disposition: A | Discharge status: Home / Self Care | Payer: Commercial Managed Care - PPO | Attending: Family Medicine | Admitting: Family Medicine

## 2019-12-06 DIAGNOSIS — G43919 Migraine, unspecified, intractable, without status migrainosus: Secondary | ICD-10-CM

## 2019-12-06 MED ORDER — KETOROLAC TROMETHAMINE 30 MG/ML IJ SOLN
INTRAMUSCULAR | Status: AC
  Filled 2019-12-06: qty 1

## 2019-12-06 MED ORDER — KETOROLAC TROMETHAMINE 30 MG/ML IJ SOLN
30.0000 mg | Freq: Once | INTRAMUSCULAR | Status: AC
  Administered 2019-12-06: 30 mg via INTRAMUSCULAR

## 2019-12-06 MED ORDER — DEXAMETHASONE SODIUM PHOSPHATE 10 MG/ML IJ SOLN
10.0000 mg | Freq: Once | INTRAMUSCULAR | Status: AC
  Administered 2019-12-06: 10 mg via INTRAMUSCULAR

## 2019-12-06 MED ORDER — ONDANSETRON 4 MG PO TBDP
4.0000 mg | ORAL_TABLET | Freq: Once | ORAL | Status: AC
  Administered 2019-12-06: 4 mg via ORAL

## 2019-12-06 MED ORDER — METOCLOPRAMIDE HCL 5 MG/ML IJ SOLN
INTRAMUSCULAR | Status: AC
  Filled 2019-12-06: qty 2

## 2019-12-06 MED ORDER — ONDANSETRON 4 MG PO TBDP: 4.0000 mg | ORAL_TABLET | Freq: Three times a day (TID) | ORAL | 0 refills | Status: DC | PRN

## 2019-12-06 MED ORDER — METOCLOPRAMIDE HCL 5 MG/ML IJ SOLN
5.0000 mg | Freq: Once | INTRAMUSCULAR | Status: AC
  Administered 2019-12-06: 5 mg via INTRAMUSCULAR

## 2019-12-06 MED ORDER — SUMATRIPTAN SUCCINATE 6 MG/0.5ML ~~LOC~~ SOLN
SUBCUTANEOUS | Status: AC
  Filled 2019-12-06: qty 0.5

## 2019-12-06 MED ORDER — SUMATRIPTAN SUCCINATE 6 MG/0.5ML ~~LOC~~ SOLN
6.0000 mg | Freq: Once | SUBCUTANEOUS | Status: AC
  Administered 2019-12-06: 6 mg via SUBCUTANEOUS

## 2019-12-06 MED ORDER — ONDANSETRON 4 MG PO TBDP
ORAL_TABLET | ORAL | Status: AC
  Filled 2019-12-06: qty 1

## 2019-12-06 MED ORDER — DEXAMETHASONE SODIUM PHOSPHATE 10 MG/ML IJ SOLN
INTRAMUSCULAR | Status: AC
  Filled 2019-12-06: qty 1

## 2019-12-06 NOTE — ED Triage Notes (Signed)
Patient reports migraine starting this morning. Patient thinks it is from exhaustion, as she on day 10 in a row of 12-hour shifts (7a-7p).

## 2019-12-06 NOTE — ED Provider Notes (Signed)
[place]   [id number] 12/06/19 Arrival Time: [id number]  ASSESSMENT & PLAN:  1. Intractable migraine without status migrainosus, unspecified migraine type      Meds ordered this encounter  Medications  . ketorolac (TORADOL) 30 MG/ML injection 30 mg  . metoCLOPramide (REGLAN) injection 5 mg  . dexamethasone (DECADRON) injection 10 mg  . SUMAtriptan (IMITREX) injection 6 mg  . ondansetron (ZOFRAN-ODT) disintegrating tablet 4 mg  . ondansetron (ZOFRAN-ODT) 4 MG disintegrating tablet    Sig: Take 1 tablet (4 mg total) by mouth every 8 (eight) hours as needed for nausea or vomiting.    Dispense:  15 tablet    Refill:  0    Normal neurological exam. Afebrile without nuchal rigidity. No indication for urgent neurodiagnostic imaging. Current presentation and symptoms are consistent with prior migraines. Work note provided.   Recommend: Follow-up Information    [name], MD.   Specialty: Internal Medicine Why: If failing to improve. Contact information: [place] [address] [phone]        [place].   Specialty: Emergency Medicine Why: If symptoms worsen in any way. Contact information: [address] [id number] [place] [phone]           Reviewed expectations re: course of current medical issues. Questions answered. Outlined signs and symptoms indicating need for more acute intervention. Patient verbalized understanding. After Visit Summary given.   SUBJECTIVE: History from: Patient. Patient is able to give a clear and coherent history.  Maureen Price is a 44 y.o. female who presents with complaint of a migraine headache. Onset abrupt, this morning. Location: right-sided unilateral without radiation. History of headaches: yes, similar symptoms. Precipitating factors include: fatigue; has worked 9-10 days in a row; 12 hour  shifts. Associated symptoms: Preceding aura: no. Nausea/vomiting: yes, nausea without vomiting. Vision changes: slightly blurry on R; eye watering. Increased sensitivity to light and to noises: yes. Fever: no. Sinus pressure/congestion: no. Extremity weakness: no. Home treatment has included ASA with no improvement. Current headache has limited normal daily activities. Had to leave work. Denies loss of balance, muscle weakness, numbness of extremities and speech difficulties. No head injury reported. Ambulatory without difficulty. No recent travel.    OBJECTIVE:  Vitals:   12/06/19 1001  BP: 129/74  Pulse: 74  Resp: 14  Temp: 98.3 F (36.8 C)  TempSrc: Oral  SpO2: 100%    General appearance: alert; appears fatigued; sitting in dimmed room HENT: normocephalic; atraumatic Eyes: PERRLA; EOMI; conjunctivae normal Neck: supple with FROM Lungs: speaks full sentences without difficulty; unlabored respirations Extremities: no edema; symmetrical with no gross deformities Skin: warm and dry Neurologic: alert; speech is fluent and clear without dysarthria or aphasia; CN 2-12 grossly intact; no facial droop; normal gait; normal extremity strength and sensation throughout Psychological: alert and cooperative; normal mood and affect    Allergies  Allergen Reactions  . Erythromycin Anaphylaxis  . Penicillins Anaphylaxis    Tolerates meropenem  . Metformin And Related Other (See Comments)    Diarrhea, upset stomach  . Viibryd [Vilazodone Hcl] Other (See Comments)    Altered mental status.    . Bee Venom Swelling    Throat swells    Past Medical History:  Diagnosis Date  . Acute myocardial infarction of other anterior wall, initial episode of care   . Anginal pain ([place])   . Anxiety   . Arthritis   . Asthma   .  CAD (coronary artery disease)   . CHF (congestive heart failure) ([place])   . Depression   . Diabetes mellitus    2002; no meds since 2004  . Dyslipidemia, goal  LDL below 70 10/26/2013  . Dysrhythmia   . GERD (gastroesophageal reflux disease)   . Headache(784.0)   . Obesity   . Paroxysmal ventricular tachycardia ([place])   . Shortness of breath   . STEMI (ST elevation myocardial infarction) ([place])   . Tobacco abuse    Social History   Socioeconomic History  . Marital status: Divorced    Spouse name: Not on file  . Number of children: Not on file  . Years of education: Not on file  . Highest education level: Not on file  Occupational History  . Not on file  Tobacco Use  . Smoking status: Current Every Day Smoker    Packs/day: 0.50    Years: 20.00    Pack years: 10.00    Types: Cigarettes  . Smokeless tobacco: Never Used  . Tobacco comment: 8-10 cigs/day  Substance and Sexual Activity  . Alcohol use: No    Comment: occasional  . Drug use: No  . Sexual activity: Not Currently    Partners: Male    Birth control/protection: Surgical  Other Topics Concern  . Not on file  Social History Narrative   Caffienated drinks- yes   Seat belt use often- yes   Regular Exercise- yes   Smoke alarm in the home- yes   Firearms/guns in the home- no   History of physical abuse- no            Social Determinants of Radio broadcast assistant Strain:   . Difficulty of Paying Living Expenses:   Food Insecurity:   . Worried About Charity fundraiser in the Last Year:   . Arboriculturist in the Last Year:   Transportation Needs:   . Film/video editor (Medical):   Marland Kitchen Lack of Transportation (Non-Medical):   Physical Activity:   . Days of Exercise per Week:   . Minutes of Exercise per Session:   Stress:   . Feeling of Stress :   Social Connections:   . Frequency of Communication with Friends and Family:   . Frequency of Social Gatherings with Friends and Family:   . Attends Religious Services:   . Active Member of Clubs or Organizations:   . Attends Archivist Meetings:   Marland Kitchen Marital Status:   Intimate Partner Violence:   .  Fear of Current or Ex-Partner:   . Emotionally Abused:   Marland Kitchen Physically Abused:   . Sexually Abused:    Family History  Problem Relation Age of Onset  . Arthritis Mother   . Diabetes Mother   . Heart disease Father   . Stroke Father   . Diabetes Maternal Grandfather   . Cancer Neg Hx   . Alcohol abuse Neg Hx   . Early death Neg Hx   . Kidney disease Neg Hx   . Hypertension Neg Hx   . Hyperlipidemia Neg Hx   . Thyroid disease Neg Hx    Past Surgical History:  Procedure Laterality Date  . CESAREAN SECTION    . CESAREAN SECTION  1994,1997,2000  . CHOLECYSTECTOMY    . CORONARY ANGIOPLASTY WITH STENT PLACEMENT    . GALLBLADDER SURGERY    . INCISION AND DRAINAGE PERIRECTAL ABSCESS N/A 01/23/2019   Procedure: IRRIGATION AND DEBRIDEMENT PERIRECTAL ABSCESS;  Surgeon:  Rolm Bookbinder, MD;  Location: [facility];  Service: General;  Laterality: N/A;  . LEFT HEART CATH N/A 10/24/2013   Procedure: LEFT HEART CATH;  Surgeon: Lorretta Harp, MD;  Location: [facility] CATH LAB;  Service: Cardiovascular;  Laterality: N/A;  . LEFT HEART CATHETERIZATION WITH CORONARY ANGIOGRAM N/A 10/26/2013   Procedure: LEFT HEART CATHETERIZATION WITH CORONARY ANGIOGRAM;  Surgeon: Burnell Blanks, MD;  Location: [facility] CATH LAB;  Service: Cardiovascular;  Laterality: N/A;  . LEFT HEART CATHETERIZATION WITH CORONARY ANGIOGRAM N/A 03/29/2014   Procedure: LEFT HEART CATHETERIZATION WITH CORONARY ANGIOGRAM;  Surgeon: Lorretta Harp, MD;  Location: [facility] CATH LAB;  Service: Cardiovascular;  Laterality: N/A;  . TUBAL LIGATION     2000     Vanessa Kick, MD 12/06/19 1018

## 2019-12-28 ENCOUNTER — Encounter: Payer: Self-pay | Admitting: Internal Medicine

## 2019-12-28 ENCOUNTER — Other Ambulatory Visit: Payer: Self-pay | Admitting: Internal Medicine

## 2019-12-28 ENCOUNTER — Telehealth: Payer: Commercial Managed Care - PPO | Admitting: Physician Assistant

## 2019-12-28 DIAGNOSIS — N898 Other specified noninflammatory disorders of vagina: Secondary | ICD-10-CM

## 2019-12-28 DIAGNOSIS — A5903 Trichomonal cystitis and urethritis: Secondary | ICD-10-CM | POA: Insufficient documentation

## 2019-12-28 MED ORDER — METRONIDAZOLE 500 MG PO TABS: 500.0000 mg | ORAL_TABLET | Freq: Two times a day (BID) | ORAL | 0 refills | Status: AC

## 2019-12-28 NOTE — Progress Notes (Signed)
If you are unable to get in touch with Dr. Adah Salvage office, you can be evaluated at one of our Urgent Care locations (see below).  We cannot treat STDs via E-visits, and you may need to be retested.  Based on what you shared with me, I feel your condition warrants further evaluation and I recommend that you be seen for a face to face office visit.   NOTE: If you entered your credit card information for this eVisit, you will not be charged. You may see a "hold" on your card for the $35 but that hold will drop off and you will not have a charge processed.   If you are having a true medical emergency please call 911.      For an urgent face to face visit, [facility] has five urgent care centers for your convenience:      NEW:  [facility] Urgent [place] [facility] [place] Get Driving Directions [id number] [place], [place] [address] . 10 am - 6pm Monday - Friday    [place] [facility]) Get Driving Directions [id number] [address] [place], [place] [address] . 10 am to 8 pm Monday-Friday . 12 pm to 8 pm [facility] Urgent Care at [facility] Get Driving Directions [id number] [place], [place], [place] [address] . 8 am to 8 pm Monday-Friday . 9 am to 6 pm Saturday . 11 am to 6 pm Sunday     [facility] at [facility] Get Driving Directions  [id number] [address] [place], [place] [address] . 8 am to 8 pm Monday-Friday . 8 am to 4 pm [facility]gent Care at [place] Get Driving Directions [id number] [place], [place] [address] . 12 pm to 6 pm Monday-Friday      Your e-visit answers were reviewed by a board certified advanced clinical practitioner to complete your personal care plan.  Thank you for using e-Visits.

## 2020-07-15 ENCOUNTER — Telehealth: Payer: Self-pay | Admitting: Internal Medicine

## 2020-07-15 NOTE — Telephone Encounter (Signed)
LVM stating that an OV is needed for r/f.

## 2020-07-15 NOTE — Telephone Encounter (Signed)
    Patient requesting refill for Insulin Glargine, 2 Unit Dial, (TOUJEO MAX SOLOSTAR) 300 UNIT/ML [place] ([place]), [place] - [address]

## 2020-07-16 ENCOUNTER — Ambulatory Visit (INDEPENDENT_AMBULATORY_CARE_PROVIDER_SITE_OTHER): Payer: Commercial Managed Care - PPO | Admitting: Internal Medicine

## 2020-07-16 ENCOUNTER — Encounter: Payer: Self-pay | Admitting: Internal Medicine

## 2020-07-16 ENCOUNTER — Other Ambulatory Visit: Payer: Self-pay

## 2020-07-16 VITALS — BP 146/84 | HR 93 | Temp 98.4°F | Resp 16 | Ht 65.5 in | Wt 225.0 lb

## 2020-07-16 DIAGNOSIS — Z124 Encounter for screening for malignant neoplasm of cervix: Secondary | ICD-10-CM

## 2020-07-16 DIAGNOSIS — J4540 Moderate persistent asthma, uncomplicated: Secondary | ICD-10-CM

## 2020-07-16 DIAGNOSIS — R809 Proteinuria, unspecified: Secondary | ICD-10-CM

## 2020-07-16 DIAGNOSIS — E1129 Type 2 diabetes mellitus with other diabetic kidney complication: Secondary | ICD-10-CM

## 2020-07-16 DIAGNOSIS — I2510 Atherosclerotic heart disease of native coronary artery without angina pectoris: Secondary | ICD-10-CM

## 2020-07-16 DIAGNOSIS — Z1159 Encounter for screening for other viral diseases: Secondary | ICD-10-CM

## 2020-07-16 DIAGNOSIS — E1151 Type 2 diabetes mellitus with diabetic peripheral angiopathy without gangrene: Secondary | ICD-10-CM | POA: Diagnosis not present

## 2020-07-16 DIAGNOSIS — IMO0002 Reserved for concepts with insufficient information to code with codable children: Secondary | ICD-10-CM

## 2020-07-16 DIAGNOSIS — D500 Iron deficiency anemia secondary to blood loss (chronic): Secondary | ICD-10-CM | POA: Diagnosis not present

## 2020-07-16 DIAGNOSIS — I70209 Unspecified atherosclerosis of native arteries of extremities, unspecified extremity: Secondary | ICD-10-CM | POA: Diagnosis not present

## 2020-07-16 DIAGNOSIS — E1165 Type 2 diabetes mellitus with hyperglycemia: Secondary | ICD-10-CM

## 2020-07-16 DIAGNOSIS — E785 Hyperlipidemia, unspecified: Secondary | ICD-10-CM

## 2020-07-16 LAB — CBC WITH DIFFERENTIAL/PLATELET
Basophils Absolute: 0.1 10*3/uL (ref 0.0–0.1)
Basophils Relative: 0.7 % (ref 0.0–3.0)
Eosinophils Absolute: 0.1 10*3/uL (ref 0.0–0.7)
Eosinophils Relative: 1.4 % (ref 0.0–5.0)
HCT: 34 % — ABNORMAL LOW (ref 36.0–46.0)
Hemoglobin: 11.2 g/dL — ABNORMAL LOW (ref 12.0–15.0)
Lymphocytes Relative: 29.4 % (ref 12.0–46.0)
Lymphs Abs: 3 10*3/uL (ref 0.7–4.0)
MCHC: 32.9 g/dL (ref 30.0–36.0)
MCV: 80.3 fl (ref 78.0–100.0)
Monocytes Absolute: 0.7 10*3/uL (ref 0.1–1.0)
Monocytes Relative: 6.6 % (ref 3.0–12.0)
Neutro Abs: 6.3 10*3/uL (ref 1.4–7.7)
Neutrophils Relative %: 61.9 % (ref 43.0–77.0)
Platelets: 310 10*3/uL (ref 150.0–400.0)
RBC: 4.23 Mil/uL (ref 3.87–5.11)
RDW: 15 % (ref 11.5–15.5)
WBC: 10.2 10*3/uL (ref 4.0–10.5)

## 2020-07-16 LAB — BASIC METABOLIC PANEL
BUN: 15 mg/dL (ref 6–23)
CO2: 25 mEq/L (ref 19–32)
Calcium: 9 mg/dL (ref 8.4–10.5)
Chloride: 104 mEq/L (ref 96–112)
Creatinine, Ser: 1.29 mg/dL — ABNORMAL HIGH (ref 0.40–1.20)
GFR: 50.59 mL/min — ABNORMAL LOW (ref >60.00)
Glucose, Bld: 132 mg/dL — ABNORMAL HIGH (ref 70–99)
Potassium: 3.7 mEq/L (ref 3.5–5.1)
Sodium: 138 mEq/L (ref 135–145)

## 2020-07-16 LAB — IRON: Iron: 25 ug/dL — ABNORMAL LOW (ref 42–145)

## 2020-07-16 LAB — POCT GLYCOSYLATED HEMOGLOBIN (HGB A1C): Hemoglobin A1C: 7.7 % — AB (ref 4.0–5.6)

## 2020-07-16 LAB — FERRITIN: Ferritin: 14.3 ng/mL (ref 10.0–291.0)

## 2020-07-16 LAB — POCT GLUCOSE (DEVICE FOR HOME USE): POC Glucose: 188 mg/dl — AB (ref 70–99)

## 2020-07-16 MED ORDER — ATORVASTATIN CALCIUM 20 MG PO TABS: 20.0000 mg | ORAL_TABLET | Freq: Every day | ORAL | 1 refills | Status: DC

## 2020-07-16 MED ORDER — TOUJEO MAX SOLOSTAR 300 UNIT/ML ~~LOC~~ SOPN: 50.0000 [IU] | PEN_INJECTOR | Freq: Every day | SUBCUTANEOUS | 0 refills | Status: DC

## 2020-07-16 MED ORDER — TRELEGY ELLIPTA 100-62.5-25 MCG/INH IN AEPB: 1.0000 | INHALATION_SPRAY | Freq: Every day | RESPIRATORY_TRACT | 1 refills | Status: DC

## 2020-07-16 MED ORDER — DAPAGLIFLOZIN PROPANEDIOL 10 MG PO TABS: 10.0000 mg | ORAL_TABLET | Freq: Every day | ORAL | 1 refills | Status: DC

## 2020-07-16 MED ORDER — GVOKE HYPOPEN 2-PACK 1 MG/0.2ML ~~LOC~~ SOAJ: 1.0000 | Freq: Every day | SUBCUTANEOUS | 5 refills | Status: DC | PRN

## 2020-07-16 MED ORDER — RYBELSUS 3 MG PO TABS: 1.0000 | ORAL_TABLET | Freq: Every day | ORAL | 0 refills | Status: DC

## 2020-07-16 NOTE — Progress Notes (Signed)
Subjective:  Patient ID: Maureen Price, female    DOB: Mar 09, 1976  Age: 44 y.o. MRN: [id number]  CC: Anemia, Hypertension, Diabetes, and Coronary Artery Disease  This visit occurred during the SARS-CoV-2 public health emergency.  Safety protocols were in place, including screening questions prior to the visit, additional usage of staff PPE, and extensive cleaning of exam room while observing appropriate contact time as indicated for disinfecting solutions.    HPI POSIE LILLIBRIDGE presents for f/up - She has not been recently using the basal insulin.  She says her blood sugars have consistently been above 200.  She complains of polys, tingling, and fatigue.  She is also had a few episodes of blurred vision.  She tells me she is under a great deal of stress at home and in the workplace.  She continues to have menstrual cycles.  She has a history of anemia.  She is not currently taking an iron supplement.  She is active and denies any recent episodes of chest pain, shortness of breath, or edema.  She has a history of asthma and is not currently using an inhaler.  She has a mild intermittent cough but denies hemoptysis, fever, or chills.    Outpatient Medications Prior to Visit  Medication Sig Dispense Refill  . aspirin EC 81 MG tablet Take 1 tablet (81 mg total) by mouth daily. 90 tablet 1  . blood glucose meter kit and supplies Dispense based on patient and insurance preference. Use up to four times daily as directed. (FOR ICD-10 E10.9, E11.9). 1 each 0  . Insulin Pen Needle (BD PEN NEEDLE NANO 2ND GEN) 32G X 4 MM MISC Use to inject insulin once per day. DX E11.8 90 each 1  . Multiple Vitamin (MULTI-VITAMIN DAILY PO) Take 1 tablet by mouth daily.    . nitroGLYCERIN (NITROSTAT) 0.4 MG SL tablet Place 1 tablet (0.4 mg total) under the tongue every 5 (five) minutes as needed for chest pain (Hold for SBP <100). 30 tablet 2  . vitamin B-12 (CYANOCOBALAMIN) 1000 MCG tablet Take 2,000 mcg by  mouth daily.    . vitamin C (ASCORBIC ACID) 500 MG tablet Take 1,000 mg by mouth daily.    Marland Kitchen atorvastatin (LIPITOR) 20 MG tablet Take 1 tablet (20 mg total) by mouth daily. 90 tablet 1  . dapagliflozin propanediol (FARXIGA) 10 MG TABS tablet Take 10 mg by mouth daily. 90 tablet 1  . Fluticasone-Umeclidin-Vilant (TRELEGY ELLIPTA) 100-62.5-25 MCG/INH AEPB Inhale 1 puff into the lungs daily. 90 each 1  . Insulin Glargine, 2 Unit Dial, (TOUJEO MAX SOLOSTAR) 300 UNIT/ML SOPN Inject 50 Units into the skin daily. 9 mL 0  . ondansetron (ZOFRAN-ODT) 4 MG disintegrating tablet Take 1 tablet (4 mg total) by mouth every 8 (eight) hours as needed for nausea or vomiting. 15 tablet 0   Facility-Administered Medications Prior to Visit  Medication Dose Route Frequency Provider Last Rate Last Admin  . fentaNYL (SUBLIMAZE) injection    Anesthesia Intra-op Suzy Bouchard, CRNA   50 mcg at 01/23/19 1308    ROS Review of Systems  Constitutional: Positive for fatigue and unexpected weight change. Negative for appetite change, chills, diaphoresis and fever.  HENT: Negative.  Negative for trouble swallowing.   Eyes: Negative for visual disturbance.  Respiratory: Positive for cough. Negative for chest tightness, shortness of breath and wheezing.   Cardiovascular: Negative for chest pain, palpitations and leg swelling.  Gastrointestinal: Negative for abdominal pain, constipation, diarrhea, nausea and vomiting.  Endocrine: Positive for polydipsia, polyphagia and polyuria.  Genitourinary: Positive for vaginal bleeding. Negative for decreased urine volume, difficulty urinating, dysuria and urgency.  Musculoskeletal: Negative for arthralgias, back pain and myalgias.  Skin: Negative.  Negative for color change, pallor and rash.  Neurological: Negative.  Negative for dizziness, weakness, light-headedness, numbness and headaches.  Hematological: Negative for adenopathy. Does not bruise/bleed easily.    Psychiatric/Behavioral: Negative.     Objective:  BP (!) 146/84   Pulse 93   Temp 98.4 F (36.9 C) (Oral)   Resp 16   Ht 5' 5.5" (1.664 m)   Wt 225 lb (102.1 kg)   LMP 07/03/2020 Comment: s/p BTL  SpO2 97%   BMI 36.87 kg/m   BP Readings from Last 3 Encounters:  07/16/20 (!) 146/84  12/06/19 129/74  11/10/19 138/84    Wt Readings from Last 3 Encounters:  07/16/20 225 lb (102.1 kg)  11/10/19 223 lb 3.2 oz (101.2 kg)  01/30/19 254 lb 10.1 oz (115.5 kg)    Physical Exam Vitals reviewed.  HENT:     Nose: Nose normal.     Mouth/Throat:     Mouth: Mucous membranes are moist. Mucous membranes are pale.  Eyes:     General: No scleral icterus.    Conjunctiva/sclera: Conjunctivae normal.  Cardiovascular:     Rate and Rhythm: Normal rate and regular rhythm.     Heart sounds: No murmur heard.   Pulmonary:     Effort: Pulmonary effort is normal.     Breath sounds: No stridor. No wheezing, rhonchi or rales.  Abdominal:     General: Abdomen is protuberant. Bowel sounds are normal. There is no distension.     Palpations: Abdomen is soft. There is no hepatomegaly, splenomegaly or mass.     Tenderness: There is no abdominal tenderness.  Musculoskeletal:        General: Normal range of motion.     Cervical back: Neck supple.     Right lower leg: No edema.     Left lower leg: No edema.  Lymphadenopathy:     Cervical: No cervical adenopathy.  Skin:    General: Skin is warm and dry.     Coloration: Skin is pale. Skin is not jaundiced.     Findings: No rash.  Neurological:     General: No focal deficit present.     Mental Status: She is alert.  Psychiatric:        Mood and Affect: Mood normal.        Behavior: Behavior normal.     Lab Results  Component Value Date   WBC 10.2 07/16/2020   HGB 11.2 (L) 07/16/2020   HCT 34.0 (L) 07/16/2020   PLT 310.0 07/16/2020   GLUCOSE 132 (H) 07/16/2020   CHOL 147 11/10/2019   TRIG 96.0 11/10/2019   HDL 30.70 (L) 11/10/2019    LDLCALC 97 11/10/2019   ALT 13 11/10/2019   AST 12 11/10/2019   NA 138 07/16/2020   K 3.7 07/16/2020   CL 104 07/16/2020   CREATININE 1.29 (H) 07/16/2020   BUN 15 07/16/2020   CO2 25 07/16/2020   TSH 1.21 11/10/2019   INR 1.3 (H) 01/23/2019   HGBA1C 7.7 (A) 07/16/2020   MICROALBUR 15.8 (H) 11/10/2019    No results found.  Assessment & Plan:   Oluwanifemi was seen today for anemia, hypertension, diabetes and coronary artery disease.  Diagnoses and all orders for this visit:  Iron deficiency anemia due to chronic  blood loss- Her H&H and iron levels are low.  This is likely related to her menstrual cycles.  I recommended that she receive a series of iron infusions. -     CBC with Differential/Platelet; Future -     Iron; Future -     Ferritin; Future -     Ferritin -     Iron -     CBC with Differential/Platelet  Uncontrolled diabetes mellitus type 2 with atherosclerosis of arteries of extremities ([place])- Her A1c is at 7.7%.  Will continue the SGLT2 inhibitor.  Will restart basal insulin.  I have also recommended that she start taking a GLP-1 agonist. -     insulin glargine, 2 Unit Dial, (TOUJEO MAX SOLOSTAR) 300 UNIT/ML Solostar Pen; Inject 50 Units into the skin daily. -     POCT glycosylated hemoglobin (Hb A1C) -     POCT Glucose (Device for Home Use) -     Basic metabolic panel; Future -     dapagliflozin propanediol (FARXIGA) 10 MG TABS tablet; Take 1 tablet (10 mg total) by mouth daily. -     Ambulatory referral to Ophthalmology -     Glucagon (GVOKE HYPOPEN 2-PACK) 1 MG/0.2ML SOAJ; Inject 1 Act into the skin daily as needed. -     Semaglutide (RYBELSUS) 3 MG TABS; Take 1 tablet by mouth daily. -     Basic metabolic panel  Cervical cancer screening -     Ambulatory referral to Gynecology  Need for hepatitis C screening test -     Hepatitis C antibody; Future -     Hepatitis C antibody  Moderate persistent asthma without complication- I recommended that she start using a  LAMA/LABA/ICS inhaler. -     Fluticasone-Umeclidin-Vilant (TRELEGY ELLIPTA) 100-62.5-25 MCG/INH AEPB; Inhale 1 puff into the lungs daily.  Coronary artery disease involving native coronary artery of native heart without angina pectoris- She has had no recent episodes of angina.  Will continue to address risk factor modifications.  I have also recommended that she have a follow-up with cardiology. -     atorvastatin (LIPITOR) 20 MG tablet; Take 1 tablet (20 mg total) by mouth daily. -     Ambulatory referral to Cardiology  Dyslipidemia, goal LDL below 70- She is doing well on the statin. -     atorvastatin (LIPITOR) 20 MG tablet; Take 1 tablet (20 mg total) by mouth daily.  Microalbuminuria due to type 2 diabetes mellitus ([place])- She has not achieved her blood pressure goal.  I recommended that she start taking an ARB.   I have discontinued Solara [employer]'s ondansetron. I have changed her Wilder Glade to dapagliflozin propanediol. I have also changed her [employer]. Additionally, I am having her start on Gvoke HypoPen 2-Pack and Rybelsus. Lastly, I am having her maintain her aspirin EC, nitroGLYCERIN, vitamin B-12, Multiple Vitamin (MULTI-VITAMIN DAILY PO), vitamin C, blood glucose meter kit and supplies, BD Pen Needle Nano 2nd Gen, Trelegy Ellipta, and atorvastatin.  Meds ordered this encounter  Medications  . insulin glargine, 2 Unit Dial, (TOUJEO MAX SOLOSTAR) 300 UNIT/ML Solostar Pen    Sig: Inject 50 Units into the skin daily.    Dispense:  9 mL    Refill:  0  . dapagliflozin propanediol (FARXIGA) 10 MG TABS tablet    Sig: Take 1 tablet (10 mg total) by mouth daily.    Dispense:  90 tablet    Refill:  1  . Glucagon (GVOKE HYPOPEN 2-PACK) 1  MG/0.2ML SOAJ    Sig: Inject 1 Act into the skin daily as needed.    Dispense:  2 mL    Refill:  5  . Semaglutide (RYBELSUS) 3 MG TABS    Sig: Take 1 tablet by mouth daily.    Dispense:  30 tablet    Refill:  0  .  Fluticasone-Umeclidin-Vilant (TRELEGY ELLIPTA) 100-62.5-25 MCG/INH AEPB    Sig: Inhale 1 puff into the lungs daily.    Dispense:  90 each    Refill:  1  . atorvastatin (LIPITOR) 20 MG tablet    Sig: Take 1 tablet (20 mg total) by mouth daily.    Dispense:  90 tablet    Refill:  1   I spent 50 minutes in preparing to see the patient by review of recent labs, imaging and procedures, obtaining and reviewing separately obtained history, communicating with the patient and family or caregiver, ordering medications, tests or procedures, and documenting clinical information in the EHR including the differential Dx, treatment, and any further evaluation and other management of 1. Iron deficiency anemia due to chronic blood loss 2. Uncontrolled diabetes mellitus type 2 with atherosclerosis of arteries of extremities ([place]) 3. Moderate persistent asthma without complication 4. Coronary artery disease involving native coronary artery of native heart without angina pectoris 5. Dyslipidemia, goal LDL below 70 6. Microalbuminuria due to type 2 diabetes mellitus ([place])     Follow-up: Return in about 3 months (around 10/16/2020).  [name], MD

## 2020-07-16 NOTE — Patient Instructions (Signed)
Type 2 Diabetes Mellitus, Diagnosis, Adult Type 2 diabetes (type 2 diabetes mellitus) is a long-term (chronic) disease. In type 2 diabetes, one or both of these problems may be present:  The pancreas does not make enough of a hormone called insulin.  Cells in the body do not respond properly to insulin that the body makes (insulin resistance). Normally, insulin allows blood sugar (glucose) to enter cells in the body. The cells use glucose for energy. Insulin resistance or lack of insulin causes excess glucose to build up in the blood instead of going into cells. As a result, high blood glucose (hyperglycemia) develops. What increases the risk? The following factors may make you more likely to develop type 2 diabetes:  Having a family member with type 2 diabetes.  Being overweight or obese.  Having an inactive (sedentary) lifestyle.  Having been diagnosed with insulin resistance.  Having a history of prediabetes, gestational diabetes, or polycystic ovary syndrome (PCOS).  Being of American-Indian, African-American, Hispanic/Latino, or Asian/Pacific Islander descent. What are the signs or symptoms? In the early stage of this condition, you may not have symptoms. Symptoms develop slowly and may include:  Increased thirst (polydipsia).  Increased hunger(polyphagia).  Increased urination (polyuria).  Increased urination during the night (nocturia).  Unexplained weight loss.  Frequent infections that keep coming back (recurring).  Fatigue.  Weakness.  Vision changes, such as blurry vision.  Cuts or bruises that are slow to heal.  Tingling or numbness in the hands or feet.  Dark patches on the skin (acanthosis nigricans). How is this diagnosed? This condition is diagnosed based on your symptoms, your medical history, a physical exam, and your blood glucose level. Your blood glucose may be checked with one or more of the following blood tests:  A fasting blood glucose (FBG)  test. You will not be allowed to eat (you will fast) for 8 hours or longer before a blood sample is taken.  A random blood glucose test. This test checks blood glucose at any time of day regardless of when you ate.  An A1c (hemoglobin A1c) blood test. This test provides information about blood glucose control over the previous 2-3 months.  An oral glucose tolerance test (OGTT). This test measures your blood glucose at two times: ? After fasting. This is your baseline blood glucose level. ? Two hours after drinking a beverage that contains glucose. You may be diagnosed with type 2 diabetes if:  Your FBG level is 126 mg/dL (7.0 mmol/L) or higher.  Your random blood glucose level is 200 mg/dL (11.1 mmol/L) or higher.  Your A1c level is 6.5% or higher.  Your OGTT result is higher than 200 mg/dL (11.1 mmol/L). These blood tests may be repeated to confirm your diagnosis. How is this treated? Your treatment may be managed by a specialist called an endocrinologist. Type 2 diabetes may be treated by following instructions from your health care provider about:  Making diet and lifestyle changes. This may include: ? Following an individualized nutrition plan that is developed by a diet and nutrition specialist (registered dietitian). ? Exercising regularly. ? Finding ways to manage stress.  Checking your blood glucose level as often as told.  Taking diabetes medicines or insulin daily. This helps to keep your blood glucose levels in the healthy range. ? If you use insulin, you may need to adjust the dosage depending on how physically active you are and what foods you eat. Your health care provider will tell you how to adjust your dosage.    Taking medicines to help prevent complications from diabetes, such as: ? Aspirin. ? Medicine to lower cholesterol. ? Medicine to control blood pressure. Your health care provider will set individualized treatment goals for you. Your goals will be based on  your age, other medical conditions you have, and how you respond to diabetes treatment. Generally, the goal of treatment is to maintain the following blood glucose levels:  Before meals (preprandial): 80-130 mg/dL (4.4-7.2 mmol/L).  After meals (postprandial): below 180 mg/dL (10 mmol/L).  A1c level: less than 7%. Follow these instructions at home: Questions to ask your health care provider  Consider asking the following questions: ? Do I need to meet with a diabetes educator? ? Where can I find a support group for people with diabetes? ? What equipment will I need to manage my diabetes at home? ? What diabetes medicines do I need, and when should I take them? ? How often do I need to check my blood glucose? ? What number can I call if I have questions? ? When is my next appointment? General instructions  Take over-the-counter and prescription medicines only as told by your health care provider.  Keep all follow-up visits as told by your health care provider. This is important.  For more information about diabetes, visit: ? American Diabetes Association (ADA): www.diabetes.org ? American Association of Diabetes Educators (AADE): www.diabeteseducator.org Contact a health care provider if:  Your blood glucose is at or above 240 mg/dL (13.3 mmol/L) for 2 days in a row.  You have been sick or have had a fever for 2 days or longer, and you are not getting better.  You have any of the following problems for more than 6 hours: ? You cannot eat or drink. ? You have nausea and vomiting. ? You have diarrhea. Get help right away if:  Your blood glucose is lower than 54 mg/dL (3.0 mmol/L).  You become confused or you have trouble thinking clearly.  You have difficulty breathing.  You have moderate or large ketone levels in your urine. Summary  Type 2 diabetes (type 2 diabetes mellitus) is a long-term (chronic) disease. In type 2 diabetes, the pancreas does not make enough of a  hormone called insulin, or cells in the body do not respond properly to insulin that the body makes (insulin resistance).  This condition is treated by making diet and lifestyle changes and taking diabetes medicines or insulin.  Your health care provider will set individualized treatment goals for you. Your goals will be based on your age, other medical conditions you have, and how you respond to diabetes treatment.  Keep all follow-up visits as told by your health care provider. This is important. This information is not intended to replace advice given to you by your health care provider. Make sure you discuss any questions you have with your health care provider. Document Revised: 10/22/2017 Document Reviewed: 09/27/2015 Elsevier Patient Education  2020 Elsevier Inc.  

## 2020-07-17 ENCOUNTER — Encounter: Payer: Self-pay | Admitting: Internal Medicine

## 2020-07-17 LAB — HEPATITIS C ANTIBODY
Hepatitis C Ab: NONREACTIVE
SIGNAL TO CUT-OFF: 0.51 (ref <1.00)

## 2020-07-17 NOTE — Telephone Encounter (Signed)
    Patient can do infusion any day.

## 2020-07-17 NOTE — Telephone Encounter (Signed)
Patient is returning call. I see in lab notes she needs to be set up for iron infusion.

## 2020-07-18 NOTE — Telephone Encounter (Signed)
Patient wondering if they would do another iron test before the infusion because she has been trying to eat a lot of red meat to help her iron.

## 2020-07-19 MED ORDER — CANDESARTAN CILEXETIL 16 MG PO TABS: 16.0000 mg | ORAL_TABLET | Freq: Every day | ORAL | 1 refills | Status: DC

## 2020-07-22 ENCOUNTER — Other Ambulatory Visit: Payer: Self-pay

## 2020-07-22 ENCOUNTER — Ambulatory Visit (HOSPITAL_COMMUNITY)
Admission: RE | Admit: 2020-07-22 | Discharge: 2020-07-22 | Disposition: A | Discharge status: Home / Self Care | Payer: Commercial Managed Care - PPO | Source: Ambulatory Visit | Attending: Internal Medicine | Admitting: Internal Medicine

## 2020-07-22 DIAGNOSIS — D500 Iron deficiency anemia secondary to blood loss (chronic): Secondary | ICD-10-CM | POA: Insufficient documentation

## 2020-07-22 MED ORDER — SODIUM CHLORIDE 0.9 % IV SOLN
750.0000 mg | Freq: Once | INTRAVENOUS | Status: AC
  Administered 2020-07-22: 750 mg via INTRAVENOUS
  Filled 2020-07-22: qty 15

## 2020-07-22 MED ORDER — SODIUM CHLORIDE 0.9 % IV SOLN
INTRAVENOUS | Status: DC | PRN
  Administered 2020-07-22: 250 mL via INTRAVENOUS

## 2020-07-22 NOTE — Progress Notes (Signed)
Patient received IV Injectafer as ordered by [name] MD. Observed for at least 30 minutes post infusion. Tolerated well, vitals stable, discharge instructions given , verbalized understanding. Patient alert, oriented, and ambulatory at the time of discharge.

## 2020-07-22 NOTE — Discharge Instructions (Signed)

## 2020-07-29 ENCOUNTER — Other Ambulatory Visit: Payer: Self-pay

## 2020-07-29 ENCOUNTER — Ambulatory Visit (HOSPITAL_COMMUNITY)
Admission: RE | Admit: 2020-07-29 | Discharge: 2020-07-29 | Disposition: A | Discharge status: Home / Self Care | Payer: Commercial Managed Care - PPO | Source: Ambulatory Visit | Attending: Internal Medicine | Admitting: Internal Medicine

## 2020-07-29 DIAGNOSIS — D500 Iron deficiency anemia secondary to blood loss (chronic): Secondary | ICD-10-CM | POA: Diagnosis not present

## 2020-07-29 MED ORDER — SODIUM CHLORIDE 0.9 % IV SOLN
750.0000 mg | Freq: Once | INTRAVENOUS | Status: AC
  Administered 2020-07-29: 750 mg via INTRAVENOUS
  Filled 2020-07-29: qty 15

## 2020-07-29 MED ORDER — SODIUM CHLORIDE 0.9 % IV SOLN
INTRAVENOUS | Status: DC | PRN
  Administered 2020-07-29: 250 mL via INTRAVENOUS

## 2020-07-29 NOTE — Progress Notes (Signed)
PATIENT CARE CENTER NOTE  Diagnosis: Iron deficiency anemia due to chronic blood loss (D50.0)   Provider: [name], MD   Procedure: Christeen Douglas infusion    Note: Patient received second Injectafer infusion via PIV. Tolerated well with no adverse reaction. Monitored patient for 30 minutes post-infusion. Vital signs stable. Discharge instructions given. Patient alert, oriented and ambulatory at discharge.

## 2020-07-29 NOTE — Discharge Instructions (Signed)

## 2020-08-05 ENCOUNTER — Encounter: Payer: Self-pay | Admitting: Internal Medicine

## 2020-08-14 ENCOUNTER — Encounter: Payer: Self-pay | Admitting: Cardiovascular Disease

## 2020-08-14 ENCOUNTER — Ambulatory Visit (INDEPENDENT_AMBULATORY_CARE_PROVIDER_SITE_OTHER): Payer: Commercial Managed Care - PPO | Admitting: Cardiovascular Disease

## 2020-08-14 ENCOUNTER — Other Ambulatory Visit: Payer: Self-pay

## 2020-08-14 VITALS — BP 136/74 | HR 92 | Ht 65.0 in | Wt 225.0 lb

## 2020-08-14 DIAGNOSIS — F17200 Nicotine dependence, unspecified, uncomplicated: Secondary | ICD-10-CM | POA: Diagnosis not present

## 2020-08-14 DIAGNOSIS — I2510 Atherosclerotic heart disease of native coronary artery without angina pectoris: Secondary | ICD-10-CM

## 2020-08-14 DIAGNOSIS — E785 Hyperlipidemia, unspecified: Secondary | ICD-10-CM | POA: Diagnosis not present

## 2020-08-14 MED ORDER — NITROGLYCERIN 0.4 MG SL SUBL: 0.4000 mg | SUBLINGUAL_TABLET | SUBLINGUAL | 2 refills | Status: AC | PRN

## 2020-08-14 NOTE — Assessment & Plan Note (Signed)
History of CAD status post anterior STEMI 10/24/2013 with direct angioplasty and drug-eluting stenting by myself at the age of 46.  She underwent repeat cath because of an abnormal Myoview stress test performed 03/06/2014.  Cath performed 03/29/2014 was entirely normal.  She denies chest pain.

## 2020-08-14 NOTE — Assessment & Plan Note (Signed)
Ongoing tobacco abuse of 1/2 pack/day down from 1 pack a day, recalcitrant to receptor modification.

## 2020-08-14 NOTE — Assessment & Plan Note (Signed)
History of dyslipidemia recently started on atorvastatin several weeks ago by her PCP.  Her most recent lipid profile performed 11/10/2019 revealed a total cholesterol 147, LDL of 97 and HDL of 30 not at goal for secondary prevention.  We will recheck a lipid liver profile in 6 to 8 weeks.

## 2020-08-14 NOTE — Progress Notes (Signed)
08/14/2020 Maureen Price   April 03, 1976  [id number]  Primary Physician [name], MD Primary Cardiologist: Lorretta Harp MD Lupe Carney, [place]  HPI:  Maureen Price is a 44 y.o.  moderately overweight married [place] American female mother of 3 children, grandmother of 16 grandchildren who works as a Quarry manager at [employer] Her primary care physician is Dr. [name]. I last saw her in the office 10/08/2015. She suffered an interval myocardial infarction 10/24/13 treated with direct angioplasty using a drug-eluting stent by myself. Her cardiac risk factor profile is notable for continued tobacco abuse, diabetes and hyperlipidemia. She had 2 admissions for chest pain rule out myocardial infarction in April and June of this year. A Myoview stress test performed 03/06/14 showed severe apical ischemia which led to a cardiac catheterization performed by myself 03/29/14 which was entirely normal. Since I last saw her she has changed jobs and now works at [employer]. She no longer has chest pain which she attributed to stress. She had stopped taking her statin drug with resulting worsening of her lipid profile however.  Since I saw her 4 years ago she did have Covid pneumonia back in May 2020 and was intubated and hospitalized for a month.  She made a full recovery.  She was recently restarted back on a statin by her PCP.  She denies chest pain or shortness of breath.   Current Meds  Medication Sig  . aspirin EC 81 MG tablet Take 1 tablet (81 mg total) by mouth daily.  Marland Kitchen atorvastatin (LIPITOR) 20 MG tablet Take 1 tablet (20 mg total) by mouth daily.  . blood glucose meter kit and supplies Dispense based on patient and insurance preference. Use up to four times daily as directed. (FOR ICD-10 E10.9, E11.9).  . dapagliflozin propanediol (FARXIGA) 10 MG TABS tablet Take 1 tablet (10 mg total) by mouth daily.  . Fluticasone-Umeclidin-Vilant (TRELEGY ELLIPTA) 100-62.5-25 MCG/INH AEPB Inhale 1  puff into the lungs daily.  . insulin glargine, 2 Unit Dial, (TOUJEO MAX SOLOSTAR) 300 UNIT/ML Solostar Pen Inject 50 Units into the skin daily.  . Insulin Pen Needle (BD PEN NEEDLE NANO 2ND GEN) 32G X 4 MM MISC Use to inject insulin once per day. DX E11.8  . Multiple Vitamin (MULTI-VITAMIN DAILY PO) Take 1 tablet by mouth daily.  . nitroGLYCERIN (NITROSTAT) 0.4 MG SL tablet Place 1 tablet (0.4 mg total) under the tongue every 5 (five) minutes as needed for chest pain (Hold for SBP <100).  . Semaglutide (RYBELSUS) 3 MG TABS Take 1 tablet by mouth daily.  . vitamin B-12 (CYANOCOBALAMIN) 1000 MCG tablet Take 2,000 mcg by mouth daily.  . vitamin C (ASCORBIC ACID) 500 MG tablet Take 1,000 mg by mouth daily.  . [DISCONTINUED] nitroGLYCERIN (NITROSTAT) 0.4 MG SL tablet Place 1 tablet (0.4 mg total) under the tongue every 5 (five) minutes as needed for chest pain (Hold for SBP <100).     Allergies  Allergen Reactions  . Erythromycin Anaphylaxis  . Penicillins Anaphylaxis    Tolerates meropenem  . Metformin And Related Other (See Comments)    Diarrhea, upset stomach  . Viibryd [Vilazodone Hcl] Other (See Comments)    Altered mental status.    . Bee Venom Swelling    Throat swells    Social History   Socioeconomic History  . Marital status: Divorced    Spouse name: Not on file  . Number of children: Not on file  . Years of education:  Not on file  . Highest education level: Not on file  Occupational History  . Not on file  Tobacco Use  . Smoking status: Current Every Day Smoker    Packs/day: 0.50    Years: 20.00    Pack years: 10.00    Types: Cigarettes  . Smokeless tobacco: Never Used  . Tobacco comment: 8-10 cigs/day  Vaping Use  . Vaping Use: Never used  Substance and Sexual Activity  . Alcohol use: No    Comment: occasional  . Drug use: No  . Sexual activity: Not Currently    Partners: Male    Birth control/protection: Surgical  Other Topics Concern  . Not on file   Social History Narrative   Caffienated drinks- yes   Seat belt use often- yes   Regular Exercise- yes   Smoke alarm in the home- yes   Firearms/guns in the home- no   History of physical abuse- no            Social Determinants of Radio broadcast assistant Strain:   . Difficulty of Paying Living Expenses: Not on file  Food Insecurity:   . Worried About Charity fundraiser in the Last Year: Not on file  . Ran Out of Food in the Last Year: Not on file  Transportation Needs:   . Lack of Transportation (Medical): Not on file  . Lack of Transportation (Non-Medical): Not on file  Physical Activity:   . Days of Exercise per Week: Not on file  . Minutes of Exercise per Session: Not on file  Stress:   . Feeling of Stress : Not on file  Social Connections:   . Frequency of Communication with Friends and Family: Not on file  . Frequency of Social Gatherings with Friends and Family: Not on file  . Attends Religious Services: Not on file  . Active Member of Clubs or Organizations: Not on file  . Attends Archivist Meetings: Not on file  . Marital Status: Not on file  Intimate Partner Violence:   . Fear of Current or Ex-Partner: Not on file  . Emotionally Abused: Not on file  . Physically Abused: Not on file  . Sexually Abused: Not on file     Review of Systems: General: negative for chills, fever, night sweats or weight changes.  Cardiovascular: negative for chest pain, dyspnea on exertion, edema, orthopnea, palpitations, paroxysmal nocturnal dyspnea or shortness of breath Dermatological: negative for rash Respiratory: negative for cough or wheezing Urologic: negative for hematuria Abdominal: negative for nausea, vomiting, diarrhea, bright red blood per rectum, melena, or hematemesis Neurologic: negative for visual changes, syncope, or dizziness All other systems reviewed and are otherwise negative except as noted above.    Blood pressure 136/74, pulse 92, height  _0  (1.651 m), weight 225 lb (102.1 kg).  General appearance: alert and no distress Neck: no adenopathy, no carotid bruit, no JVD, supple, symmetrical, trachea midline and thyroid not enlarged, symmetric, no tenderness/mass/nodules Lungs: clear to auscultation bilaterally Heart: regular rate and rhythm, S1, S2 normal, no murmur, click, rub or gallop Extremities: extremities normal, atraumatic, no cyanosis or edema Pulses: 2+ and symmetric Skin: Skin color, texture, turgor normal. No rashes or lesions Neurologic: Alert and oriented X 3, normal strength and tone. Normal symmetric reflexes. Normal coordination and gait  EKG sinus rhythm 92 with septal Q waves.  I personally reviewed this EKG.  ASSESSMENT AND PLAN:   Dyslipidemia, goal LDL below 70 History of dyslipidemia  recently started on atorvastatin several weeks ago by her PCP.  Her most recent lipid profile performed 11/10/2019 revealed a total cholesterol 147, LDL of 97 and HDL of 30 not at goal for secondary prevention.  We will recheck a lipid liver profile in 6 to 8 weeks.  Coronary artery disease History of CAD status post anterior STEMI 10/24/2013 with direct angioplasty and drug-eluting stenting by myself at the age of 65.  She underwent repeat cath because of an abnormal Myoview stress test performed 03/06/2014.  Cath performed 03/29/2014 was entirely normal.  She denies chest pain.  Tobacco use disorder Ongoing tobacco abuse of 1/2 pack/day down from 1 pack a day, recalcitrant to receptor modification.      Lorretta Harp MD FACP,FACC,FAHA, [facility] 08/14/2020 11:22 AM

## 2020-08-14 NOTE — Patient Instructions (Signed)
Medication Instructions:  Your physician recommends that you continue on your current medications as directed. Please refer to the Current Medication list given to you today.  *If you need a refill on your cardiac medications before your next appointment, please call your pharmacy*   Lab Work: Your physician recommends that you return for lab work in: 6-8 weeks for lipid/liver profile.   If you have labs (blood work) drawn today and your tests are completely normal, you will receive your results only by: Marland Kitchen MyChart Message (if you have MyChart) OR . A paper copy in the mail If you have any lab test that is abnormal or we need to change your treatment, we will call you to review the results.  Follow-Up: At [facility], you and your health needs are our priority.  As part of our continuing mission to provide you with exceptional heart care, we have created designated Provider Care Teams.  These Care Teams include your primary Cardiologist (physician) and Advanced Practice Providers (APPs -  Physician Assistants and Nurse Practitioners) who all work together to provide you with the care you need, when you need it.  We recommend signing up for the patient portal called "MyChart".  Sign up information is provided on this After Visit Summary.  MyChart is used to connect with patients for Virtual Visits (Telemedicine).  Patients are able to view lab/test results, encounter notes, upcoming appointments, etc.  Non-urgent messages can be sent to your provider as well.   To learn more about what you can do with MyChart, go to [email or web address].    Your next appointment:   12 month(s)  The format for your next appointment:   In Person  Provider:   Quay Burow, MD

## 2020-09-17 ENCOUNTER — Telehealth: Payer: Self-pay | Admitting: Internal Medicine

## 2020-09-17 NOTE — Telephone Encounter (Signed)
   Patient states she is having major issues with anxiety at her job due to all the [employer] cases She is requesting order for Xanax be sent to [place] ([place] - [address]

## 2020-09-21 ENCOUNTER — Other Ambulatory Visit: Payer: Self-pay | Admitting: Internal Medicine

## 2020-09-21 DIAGNOSIS — F4322 Adjustment disorder with anxiety: Secondary | ICD-10-CM

## 2020-09-21 DIAGNOSIS — F411 Generalized anxiety disorder: Secondary | ICD-10-CM

## 2020-09-21 MED ORDER — ALPRAZOLAM 0.5 MG PO TABS: 0.5000 mg | ORAL_TABLET | Freq: Two times a day (BID) | ORAL | 2 refills | Status: DC | PRN

## 2020-11-06 ENCOUNTER — Encounter: Payer: Self-pay | Admitting: Internal Medicine

## 2020-11-10 ENCOUNTER — Encounter: Payer: Self-pay | Admitting: Internal Medicine

## 2020-11-18 NOTE — Telephone Encounter (Signed)
Patient wondering if we can mail the letter to her, she works every day until April 13th and doesn't have a way to come get it. Address on file is correct.

## 2020-11-25 ENCOUNTER — Emergency Department (HOSPITAL_BASED_OUTPATIENT_CLINIC_OR_DEPARTMENT_OTHER): Payer: Commercial Managed Care - PPO

## 2020-11-25 ENCOUNTER — Other Ambulatory Visit: Payer: Self-pay

## 2020-11-25 ENCOUNTER — Emergency Department (HOSPITAL_BASED_OUTPATIENT_CLINIC_OR_DEPARTMENT_OTHER)
Admission: EM | Admit: 2020-11-25 | Discharge: 2020-11-25 | Disposition: A | Discharge status: Home / Self Care | Payer: Commercial Managed Care - PPO | Attending: Emergency Medicine | Admitting: Emergency Medicine

## 2020-11-25 ENCOUNTER — Encounter (HOSPITAL_BASED_OUTPATIENT_CLINIC_OR_DEPARTMENT_OTHER): Payer: Self-pay | Admitting: Emergency Medicine

## 2020-11-25 DIAGNOSIS — I110 Hypertensive heart disease with heart failure: Secondary | ICD-10-CM | POA: Insufficient documentation

## 2020-11-25 DIAGNOSIS — E119 Type 2 diabetes mellitus without complications: Secondary | ICD-10-CM | POA: Insufficient documentation

## 2020-11-25 DIAGNOSIS — I2510 Atherosclerotic heart disease of native coronary artery without angina pectoris: Secondary | ICD-10-CM | POA: Diagnosis not present

## 2020-11-25 DIAGNOSIS — Z7982 Long term (current) use of aspirin: Secondary | ICD-10-CM | POA: Diagnosis not present

## 2020-11-25 DIAGNOSIS — I5032 Chronic diastolic (congestive) heart failure: Secondary | ICD-10-CM | POA: Insufficient documentation

## 2020-11-25 DIAGNOSIS — F17210 Nicotine dependence, cigarettes, uncomplicated: Secondary | ICD-10-CM | POA: Diagnosis not present

## 2020-11-25 DIAGNOSIS — Z7952 Long term (current) use of systemic steroids: Secondary | ICD-10-CM | POA: Insufficient documentation

## 2020-11-25 DIAGNOSIS — N390 Urinary tract infection, site not specified: Secondary | ICD-10-CM

## 2020-11-25 DIAGNOSIS — Z794 Long term (current) use of insulin: Secondary | ICD-10-CM | POA: Diagnosis not present

## 2020-11-25 DIAGNOSIS — R102 Pelvic and perineal pain: Secondary | ICD-10-CM | POA: Insufficient documentation

## 2020-11-25 DIAGNOSIS — J45909 Unspecified asthma, uncomplicated: Secondary | ICD-10-CM | POA: Insufficient documentation

## 2020-11-25 DIAGNOSIS — Z79899 Other long term (current) drug therapy: Secondary | ICD-10-CM | POA: Diagnosis not present

## 2020-11-25 LAB — WET PREP, GENITAL
Clue Cells Wet Prep HPF POC: NONE SEEN
Sperm: NONE SEEN
Trich, Wet Prep: NONE SEEN
Yeast Wet Prep HPF POC: NONE SEEN

## 2020-11-25 LAB — URINALYSIS, ROUTINE W REFLEX MICROSCOPIC
Bilirubin Urine: NEGATIVE
Glucose, UA: NEGATIVE mg/dL
Hgb urine dipstick: NEGATIVE
Ketones, ur: NEGATIVE mg/dL
Nitrite: POSITIVE — AB
Protein, ur: 30 mg/dL — AB
Specific Gravity, Urine: 1.011 (ref 1.005–1.030)
pH: 5.5 (ref 5.0–8.0)

## 2020-11-25 LAB — BASIC METABOLIC PANEL
Anion gap: 9 (ref 5–15)
BUN: 19 mg/dL (ref 6–20)
CO2: 24 mmol/L (ref 22–32)
Calcium: 8.9 mg/dL (ref 8.9–10.3)
Chloride: 108 mmol/L (ref 98–111)
Creatinine, Ser: 1.07 mg/dL — ABNORMAL HIGH (ref 0.44–1.00)
GFR, Estimated: >60 mL/min (ref >60)
Glucose, Bld: 180 mg/dL — ABNORMAL HIGH (ref 70–99)
Potassium: 4.1 mmol/L (ref 3.5–5.1)
Sodium: 141 mmol/L (ref 135–145)

## 2020-11-25 LAB — CBC WITH DIFFERENTIAL/PLATELET
Abs Immature Granulocytes: 0.04 10*3/uL (ref 0.00–0.07)
Basophils Absolute: 0.1 10*3/uL (ref 0.0–0.1)
Basophils Relative: 1 %
Eosinophils Absolute: 0.2 10*3/uL (ref 0.0–0.5)
Eosinophils Relative: 2 %
HCT: 35 % — ABNORMAL LOW (ref 36.0–46.0)
Hemoglobin: 11.8 g/dL — ABNORMAL LOW (ref 12.0–15.0)
Immature Granulocytes: 0 %
Lymphocytes Relative: 23 %
Lymphs Abs: 2.1 10*3/uL (ref 0.7–4.0)
MCH: 28.9 pg (ref 26.0–34.0)
MCHC: 33.7 g/dL (ref 30.0–36.0)
MCV: 85.8 fL (ref 80.0–100.0)
Monocytes Absolute: 0.6 10*3/uL (ref 0.1–1.0)
Monocytes Relative: 7 %
Neutro Abs: 6.3 10*3/uL (ref 1.7–7.7)
Neutrophils Relative %: 67 %
Platelets: 281 10*3/uL (ref 150–400)
RBC: 4.08 MIL/uL (ref 3.87–5.11)
RDW: 13.4 % (ref 11.5–15.5)
WBC: 9.2 10*3/uL (ref 4.0–10.5)
nRBC: 0 % (ref 0.0–0.2)

## 2020-11-25 LAB — PREGNANCY, URINE: Preg Test, Ur: NEGATIVE

## 2020-11-25 MED ORDER — FENTANYL CITRATE (PF) 100 MCG/2ML IJ SOLN
50.0000 ug | Freq: Once | INTRAMUSCULAR | Status: AC
  Administered 2020-11-25: 50 ug via INTRAVENOUS
  Filled 2020-11-25: qty 2

## 2020-11-25 MED ORDER — IOHEXOL 300 MG/ML  SOLN
100.0000 mL | Freq: Once | INTRAMUSCULAR | Status: AC | PRN
  Administered 2020-11-25: 100 mL via INTRAVENOUS

## 2020-11-25 MED ORDER — SULFAMETHOXAZOLE-TRIMETHOPRIM 800-160 MG PO TABS: 1.0000 | ORAL_TABLET | Freq: Two times a day (BID) | ORAL | 0 refills | Status: AC

## 2020-11-25 NOTE — ED Notes (Signed)
Pt stated that the pain medication was effective. Vitals WDL. Pt did request ice chips, MD notified

## 2020-11-25 NOTE — ED Notes (Signed)
Ice chips given

## 2020-11-25 NOTE — Discharge Instructions (Addendum)
You have been seen and discharged from the emergency department.  Your urinalysis showed infection and the CAT scan showed mild swelling of the right kidney, most likely consistent with a passed kidney stone.  Take antibiotic as directed, stay well-hydrated.  Take Tylenol/ibuprofen as needed for pain control.  Follow-up with your primary provider for reevaluation. Take home medications as prescribed. If you have any worsening symptoms or further concerns for health please return to an emergency department for further evaluation.

## 2020-11-25 NOTE — ED Provider Notes (Signed)
Patient received in signout.  Briefly this is a 45 year old female who presented with lower pelvic/vaginal pain that was sudden onset.  Work-up thus far shows urinary tract infection, pelvic done by previous provider showed cervical motion tenderness but no other concerning findings.  Pelvic ultrasound showed no ovarian torsion but did identify fibroids.  Given the sudden onset and degree of discomfort a CT was ordered.  We are pending CT results. Physical Exam  BP 136/81 (BP Location: Right Arm)   Pulse 75   Temp 97.8 F (36.6 C) (Oral)   Resp 19   Ht 5\' 5"  (1.651 m)   Wt 100.7 kg   LMP 10/23/2020 (Approximate)   SpO2 100%   BMI 36.94 kg/m   Physical Exam  ED Course/Procedures     Procedures  MDM   CAT scan identified mild fullness of the right renal collecting system, possibly recent past kidney stone.  No other identified stones.  Patient passed p.o. test.  Will place on antibiotics for UTI, cervical swab pending.  Patient will be discharged and treated as an outpatient.  Discharge plan and strict return to ED precautions discussed, patient verbalizes understanding and agreement.      Lorelle Gibbs, DO 11/25/20 1619

## 2020-11-25 NOTE — ED Notes (Signed)
Patient transported to Ultrasound 

## 2020-11-25 NOTE — ED Provider Notes (Signed)
[facility] EMERGENCY DEPT Provider Note   CSN: [id number] Arrival date & time: 11/25/20  1159     History Chief Complaint  Patient presents with  . Abdominal Pain    Maureen Price is a 45 y.o. female.  HPI Patient presents with vaginal pain.  Was working in [employer] began to have sudden severe vaginal pain.  States it felt as if something was tried to come out.  Denies possibility of pregnancy.  States she has not had intercourse in 2 years.  States she urinated and had a bowel movement and both were normal.  Did not change the pain.  Came in for further evaluation.  Finished up her menses around 10 days ago and had a 6-day menses.  Denies vaginal bleeding or discharge.    Past Medical History:  Diagnosis Date  . Acute myocardial infarction of other anterior wall, initial episode of care   . Anginal pain ([place])   . Anxiety   . Arthritis   . Asthma   . CAD (coronary artery disease)   . CHF (congestive heart failure) ([place])   . Depression   . Diabetes mellitus    2002; no meds since 2004  . Dyslipidemia, goal LDL below 70 10/26/2013  . Dysrhythmia   . GERD (gastroesophageal reflux disease)   . Headache(784.0)   . Obesity   . Paroxysmal ventricular tachycardia ([place])   . Shortness of breath   . STEMI (ST elevation myocardial infarction) ([place])   . Tobacco abuse     Patient Active Problem List   Diagnosis Date Noted  . Trichomonal cystitis and urethritis 12/28/2019  . Routine general medical examination at a health care facility 11/10/2019  . Deficiency anemia 11/10/2019  . Cervical cancer screening 11/10/2019  . Iron deficiency anemia due to chronic blood loss 11/10/2019  . Chronic diastolic (congestive) heart failure ([place]) 01/23/2019  . Anxiety 01/22/2019  . Panic anxiety syndrome 01/05/2019  . Long-term current use of opiate analgesic 02/28/2018  . Microalbuminuria due to type 2 diabetes mellitus ([place]) 06/24/2017  . Visit for screening  mammogram 06/23/2017  . Asthma 09/18/2016  . DDD (degenerative disc disease), lumbar 01/02/2015  . Atherosclerosis of native artery of extremity with intermittent claudication ([place]) 03/05/2014  . Tobacco use disorder 03/05/2014  . Coronary artery disease 11/06/2013  . Dyslipidemia, goal LDL below 70 10/26/2013  . Obesity   . Uncontrolled diabetes mellitus type 2 with atherosclerosis of arteries of extremities ([place]) 07/07/2012  . DJD (degenerative joint disease) of knee 07/07/2012    Past Surgical History:  Procedure Laterality Date  . CESAREAN SECTION    . CESAREAN SECTION  1994,1997,2000  . CHOLECYSTECTOMY    . CORONARY ANGIOPLASTY WITH STENT PLACEMENT    . GALLBLADDER SURGERY    . INCISION AND DRAINAGE PERIRECTAL ABSCESS N/A 01/23/2019   Procedure: IRRIGATION AND DEBRIDEMENT PERIRECTAL ABSCESS;  Surgeon: Rolm Bookbinder, MD;  Location: [facility];  Service: General;  Laterality: N/A;  . LEFT HEART CATH N/A 10/24/2013   Procedure: LEFT HEART CATH;  Surgeon: Lorretta Harp, MD;  Location: [facility] CATH LAB;  Service: Cardiovascular;  Laterality: N/A;  . LEFT HEART CATHETERIZATION WITH CORONARY ANGIOGRAM N/A 10/26/2013   Procedure: LEFT HEART CATHETERIZATION WITH CORONARY ANGIOGRAM;  Surgeon: Burnell Blanks, MD;  Location: [facility] CATH LAB;  Service: Cardiovascular;  Laterality: N/A;  . LEFT HEART CATHETERIZATION WITH CORONARY ANGIOGRAM N/A 03/29/2014   Procedure: LEFT HEART CATHETERIZATION WITH CORONARY ANGIOGRAM;  Surgeon: Lorretta Harp, MD;  Location: [facility] CATH LAB;  Service: Cardiovascular;  Laterality: N/A;  . TUBAL LIGATION     2000     OB History    Gravida  4   Para  3   Term  3   Preterm      AB  1   Living  3     SAB  1   IAB      Ectopic      Multiple      Live Births              Family History  Problem Relation Age of Onset  . Arthritis Mother   . Diabetes Mother   . Heart disease Father   . Stroke Father   . Diabetes Maternal Grandfather   .  Cancer Neg Hx   . Alcohol abuse Neg Hx   . Early death Neg Hx   . Kidney disease Neg Hx   . Hypertension Neg Hx   . Hyperlipidemia Neg Hx   . Thyroid disease Neg Hx     Social History   Tobacco Use  . Smoking status: Current Every Day Smoker    Packs/day: 0.50    Years: 20.00    Pack years: 10.00    Types: Cigarettes  . Smokeless tobacco: Never Used  . Tobacco comment: 8-10 cigs/day  Vaping Use  . Vaping Use: Never used  Substance Use Topics  . Alcohol use: Yes    Comment: occasional  . Drug use: No    Home Medications Prior to Admission medications   Medication Sig Start Date End Date Taking? Authorizing Provider  ALPRAZolam Duanne Moron) 0.5 MG tablet Take 1 tablet (0.5 mg total) by mouth 2 (two) times daily as needed for anxiety. 09/21/20  Yes [name], MD  aspirin EC 81 MG tablet Take 1 tablet (81 mg total) by mouth daily. 02/28/18  Yes [name], MD  atorvastatin (LIPITOR) 20 MG tablet Take 1 tablet (20 mg total) by mouth daily. 07/16/20  Yes [name], MD  dapagliflozin propanediol (FARXIGA) 10 MG TABS tablet Take 1 tablet (10 mg total) by mouth daily. 07/16/20  Yes [name], MD  Fluticasone-Umeclidin-Vilant (TRELEGY ELLIPTA) 100-62.5-25 MCG/INH AEPB Inhale 1 puff into the lungs daily. 07/16/20  Yes [name], MD  Insulin Pen Needle (BD PEN NEEDLE NANO 2ND GEN) 32G X 4 MM MISC Use to inject insulin once per day. DX E11.8 02/23/19  Yes [name], MD  Multiple Vitamin (MULTI-VITAMIN DAILY PO) Take 1 tablet by mouth daily.   Yes [provider]  vitamin B-12 (CYANOCOBALAMIN) 1000 MCG tablet Take 2,000 mcg by mouth daily.   Yes [provider]  vitamin C (ASCORBIC ACID) 500 MG tablet Take 1,000 mg by mouth daily.   Yes [provider]  blood glucose meter kit and supplies Dispense based on patient and insurance preference. Use up to four times daily as directed. (FOR ICD-10 E10.9, E11.9). 02/06/19   Patrecia Pour, MD   insulin glargine, 2 Unit Dial, (TOUJEO MAX SOLOSTAR) 300 UNIT/ML Solostar Pen Inject 50 Units into the skin daily. 07/16/20   [name], MD  nitroGLYCERIN (NITROSTAT) 0.4 MG SL tablet Place 1 tablet (0.4 mg total) under the tongue every 5 (five) minutes as needed for chest pain (Hold for SBP <100). 08/14/20   Lorretta Harp, MD  Semaglutide (RYBELSUS) 3 MG TABS Take 1 tablet by mouth daily. 07/16/20   [name],  MD    Allergies    Erythromycin, Penicillins, Metformin and related, Viibryd [vilazodone hcl], and Bee venom  Review of Systems   Review of Systems  Constitutional: Negative for appetite change.  HENT: Negative for congestion.   Respiratory: Negative for shortness of breath.   Cardiovascular: Negative for chest pain.  Gastrointestinal: Positive for abdominal pain.  Genitourinary: Positive for vaginal pain. Negative for vaginal bleeding and vaginal discharge.  Musculoskeletal: Negative for back pain.  Skin: Negative for rash.  Neurological: Negative for weakness.  Psychiatric/Behavioral: Negative for confusion.    Physical Exam Updated Vital Signs BP 136/81 (BP Location: Right Arm)   Pulse 75   Temp 97.8 F (36.6 C) (Oral)   Resp 19   Ht 5' 5"  (1.651 m)   Wt 100.7 kg   LMP 10/23/2020 (Approximate)   SpO2 100%   BMI 36.94 kg/m   Physical Exam Vitals and nursing note reviewed.  HENT:     Head: Normocephalic.  Cardiovascular:     Rate and Rhythm: Normal rate and regular rhythm.  Abdominal:     Hernia: No hernia is present.     Comments: Suprapubic tenderness without rebound or guarding.  No hernia palpated.  Genitourinary:    Comments: No adnexal tenderness.  No vaginal discharge.  Does have cervical motion tenderness however. Skin:    General: Skin is warm.     Capillary Refill: Capillary refill takes less than 2 seconds.  Neurological:     Mental Status: She is alert and oriented to person, place, and time.  Psychiatric:        Mood and Affect:  Mood normal.     ED Results / Procedures / Treatments   Labs (all labs ordered are listed, but only abnormal results are displayed) Labs Reviewed  WET PREP, GENITAL - Abnormal; Notable for the following components:      Result Value   WBC, Wet Prep HPF POC FEW (*)    All other components within normal limits  CBC WITH DIFFERENTIAL/PLATELET - Abnormal; Notable for the following components:   Hemoglobin 11.8 (*)    HCT 35.0 (*)    All other components within normal limits  URINALYSIS, ROUTINE W REFLEX MICROSCOPIC - Abnormal; Notable for the following components:   Protein, ur 30 (*)    Nitrite POSITIVE (*)    Leukocytes,Ua SMALL (*)    Bacteria, UA MANY (*)    All other components within normal limits  BASIC METABOLIC PANEL - Abnormal; Notable for the following components:   Glucose, Bld 180 (*)    Creatinine, Ser 1.07 (*)    All other components within normal limits  URINE CULTURE  PREGNANCY, URINE  GC/CHLAMYDIA PROBE AMP ([place]) NOT AT [facility]    EKG None  Radiology US PELVIC COMPLETE W TRANSVAGINAL AND TORSION R/O  Result Date: 11/25/2020 CLINICAL DATA:  Midline intermittent abdominal pain with vaginal and rectal pain for 45 minutes, LMP 10/23/2020 EXAM: TRANSABDOMINAL AND TRANSVAGINAL ULTRASOUND OF PELVIS DOPPLER ULTRASOUND OF OVARIES TECHNIQUE: Both transabdominal and transvaginal ultrasound examinations of the pelvis were performed. Transabdominal technique was performed for global imaging of the pelvis including uterus, ovaries, adnexal regions, and pelvic cul-de-sac. It was necessary to proceed with endovaginal exam following the transabdominal exam to visualize the uterus, endometrium, and ovaries. Color and duplex Doppler ultrasound was utilized to evaluate blood flow to the ovaries. COMPARISON:  09/25/2015 FINDINGS: Uterus Measurements: 10.5 x 6.1 x 6.4 cm = volume: 212 mL. Anteverted. Heterogeneous myometrium. Three uterine  leiomyomata are identified including a 4.2  cm intramural leiomyoma at upper RIGHT uterus, 2.0 cm intramural leiomyoma upper uterus, and exophytic leiomyoma 1.9 cm at fundus. Endometrium Thickness: 9 mm.  No endometrial fluid or focal abnormality Right ovary Measurements: 2.3 x 3.3 x 2.6 cm = volume: 10.3 mL. Normal morphology without mass. Internal blood flow present on color Doppler imaging. Left ovary Measurements: 4.3 x 3.1 x 2.7 cm = volume: 18.7 mL. Normal morphology without mass. Internal blood flow present on color Doppler imaging. Pulsed Doppler evaluation of both ovaries demonstrates normal low-resistance arterial and venous waveforms. Other findings Trace free pelvic fluid.  No adnexal masses. IMPRESSION: Three uterine leiomyomata, largest 4.2 cm diameter at upper RIGHT uterus. Remainder of exam unremarkable. No evidence of ovarian mass or torsion. Electronically Signed   By: Lavonia Dana M.D.   On: 11/25/2020 14:48    Procedures Procedures   Medications Ordered in ED Medications  fentaNYL (SUBLIMAZE) injection 50 mcg (50 mcg Intravenous Given 11/25/20 1459)    ED Course  I have reviewed the triage vital signs and the nursing notes.  Pertinent labs & imaging results that were available during my care of the patient were reviewed by me and considered in my medical decision making (see chart for details).    MDM Rules/Calculators/A&P                          Patient with lower abdominal pain.  Began acutely.  No dysuria.  Ultrasound done reassuring.  Pelvic exam showed no discharge but did have some cervical motion tenderness.  Systemic white count is reassuring.  With continued pain and mild tenderness CT scan will be done to further evaluate.  If negative will treat for the UTI that showed up in the urine but without dysuria I think the patient needs a further work-up.  Care turned over to Dr. Dina Rich. Final Clinical Impression(s) / ED Diagnoses Final diagnoses:  Pelvic pain    Rx / DC Orders ED Discharge Orders    None        Davonna Belling, MD 11/25/20 1504

## 2020-11-25 NOTE — ED Triage Notes (Signed)
Pt stated that she has lower intermitent abdominal in her vaginal and rectal area area that started approximately 45 minutes ago. Pt states is nauseas but denies emesis.

## 2020-11-25 NOTE — ED Notes (Signed)
Pt returns from CT.

## 2020-11-25 NOTE — ED Notes (Signed)
Pelvic cart at bedside. 

## 2020-11-25 NOTE — ED Notes (Signed)
Pt to ct 

## 2020-11-26 LAB — GC/CHLAMYDIA PROBE AMP (~~LOC~~) NOT AT ARMC
Chlamydia: NEGATIVE
Comment: NEGATIVE
Comment: NORMAL
Neisseria Gonorrhea: NEGATIVE

## 2020-11-27 LAB — URINE CULTURE: Culture: >=100000 — AB

## 2021-01-25 ENCOUNTER — Encounter (HOSPITAL_BASED_OUTPATIENT_CLINIC_OR_DEPARTMENT_OTHER): Payer: Self-pay | Admitting: Emergency Medicine

## 2021-01-25 ENCOUNTER — Emergency Department (HOSPITAL_BASED_OUTPATIENT_CLINIC_OR_DEPARTMENT_OTHER)
Admission: EM | Admit: 2021-01-25 | Discharge: 2021-01-25 | Disposition: A | Discharge status: Home / Self Care | Payer: Commercial Managed Care - PPO | Attending: Emergency Medicine | Admitting: Emergency Medicine

## 2021-01-25 ENCOUNTER — Other Ambulatory Visit: Payer: Self-pay

## 2021-01-25 DIAGNOSIS — Z7982 Long term (current) use of aspirin: Secondary | ICD-10-CM | POA: Insufficient documentation

## 2021-01-25 DIAGNOSIS — F17210 Nicotine dependence, cigarettes, uncomplicated: Secondary | ICD-10-CM | POA: Insufficient documentation

## 2021-01-25 DIAGNOSIS — R739 Hyperglycemia, unspecified: Secondary | ICD-10-CM

## 2021-01-25 DIAGNOSIS — Z794 Long term (current) use of insulin: Secondary | ICD-10-CM | POA: Insufficient documentation

## 2021-01-25 DIAGNOSIS — E1165 Type 2 diabetes mellitus with hyperglycemia: Secondary | ICD-10-CM | POA: Diagnosis not present

## 2021-01-25 DIAGNOSIS — J45909 Unspecified asthma, uncomplicated: Secondary | ICD-10-CM | POA: Diagnosis not present

## 2021-01-25 DIAGNOSIS — Z955 Presence of coronary angioplasty implant and graft: Secondary | ICD-10-CM | POA: Insufficient documentation

## 2021-01-25 LAB — CBC WITH DIFFERENTIAL/PLATELET
Abs Immature Granulocytes: 0.03 10*3/uL (ref 0.00–0.07)
Basophils Absolute: 0.1 10*3/uL (ref 0.0–0.1)
Basophils Relative: 1 %
Eosinophils Absolute: 0.4 10*3/uL (ref 0.0–0.5)
Eosinophils Relative: 4 %
HCT: 36.7 % (ref 36.0–46.0)
Hemoglobin: 12.5 g/dL (ref 12.0–15.0)
Immature Granulocytes: 0 %
Lymphocytes Relative: 36 %
Lymphs Abs: 3 10*3/uL (ref 0.7–4.0)
MCH: 28.9 pg (ref 26.0–34.0)
MCHC: 34.1 g/dL (ref 30.0–36.0)
MCV: 85 fL (ref 80.0–100.0)
Monocytes Absolute: 1.1 10*3/uL — ABNORMAL HIGH (ref 0.1–1.0)
Monocytes Relative: 13 %
Neutro Abs: 3.8 10*3/uL (ref 1.7–7.7)
Neutrophils Relative %: 46 %
Platelets: 235 10*3/uL (ref 150–400)
RBC: 4.32 MIL/uL (ref 3.87–5.11)
RDW: 13 % (ref 11.5–15.5)
WBC: 8.4 10*3/uL (ref 4.0–10.5)
nRBC: 0 % (ref 0.0–0.2)

## 2021-01-25 LAB — COMPREHENSIVE METABOLIC PANEL
ALT: 16 U/L (ref 0–44)
AST: 16 U/L (ref 15–41)
Albumin: 3.8 g/dL (ref 3.5–5.0)
Alkaline Phosphatase: 79 U/L (ref 38–126)
Anion gap: 10 (ref 5–15)
BUN: 16 mg/dL (ref 6–20)
CO2: 19 mmol/L — ABNORMAL LOW (ref 22–32)
Calcium: 8.7 mg/dL — ABNORMAL LOW (ref 8.9–10.3)
Chloride: 109 mmol/L (ref 98–111)
Creatinine, Ser: 1.23 mg/dL — ABNORMAL HIGH (ref 0.44–1.00)
GFR, Estimated: 56 mL/min — ABNORMAL LOW (ref >60)
Glucose, Bld: 76 mg/dL (ref 70–99)
Potassium: 3.8 mmol/L (ref 3.5–5.1)
Sodium: 138 mmol/L (ref 135–145)
Total Bilirubin: 0.3 mg/dL (ref 0.3–1.2)
Total Protein: 7.1 g/dL (ref 6.5–8.1)

## 2021-01-25 LAB — CBG MONITORING, ED
Glucose-Capillary: 226 mg/dL — ABNORMAL HIGH (ref 70–99)
Glucose-Capillary: 86 mg/dL (ref 70–99)

## 2021-01-25 NOTE — ED Notes (Signed)
Pt's CBG result was 86. Informed [employer] manager.

## 2021-01-25 NOTE — ED Notes (Signed)
Pt's CBG result was 226. Informed Zenia Resides - RN.

## 2021-01-25 NOTE — ED Triage Notes (Signed)
Pt is diabetic, didn't take insulin today.was at work checked blood sugar and was in 300's . Pt received 15 units Humalog ,rechecked and cbg was higher. Feeling dizzy and lethargic.

## 2021-01-25 NOTE — ED Notes (Signed)
Pt given graham crackers and peanut butter, per

## 2021-01-25 NOTE — ED Notes (Signed)
Patient states in process of moving and has not been able to take her insulin for seven days.  States at work today blood glucose was above 300  Got 15 units insulin at work but came here as feeling tired and weeak and slight dizziness.

## 2021-01-25 NOTE — Discharge Instructions (Addendum)
We suspect that your symptoms of weakness and fatigue were because of elevated blood sugar.  Thankfully, by the time he got to the emergency room your sugar was already getting better.  No evidence of any complication from elevated blood sugar.  Your sugar did drop into the 80s with the insulin you took prior to ED arrival.  Ensure that your blood sugar is frequently checked the rest of today, start back on your medications as prescribed.

## 2021-01-25 NOTE — ED Provider Notes (Signed)
[facility] EMERGENCY DEPT Provider Note   CSN: [id number] Arrival date & time: 01/25/21  1146     History Chief Complaint  Patient presents with  . Hyperglycemia    Maureen Price is a 45 y.o. female.  HPI    45 year old comes in with chief complaint of elevated blood sugar. Patient has history of CAD, CHF, diabetes.  She started feeling unwell while at work.  She was dizzy, weak -symptoms were similar to what she feels when her sugar is elevated.  She does indicate that she is in the process of moving and had not taken her insulin, with the oral antihyperglycemic only.  When she went to work this morning she is fine.  There is no associated chest pain, shortness of breath, nausea, vomiting, fevers, chills.  Patient denies any UTI-like symptoms.  She is having some congestion, but does not think it is COVID-19.  She took 15 units of Humalog from her work facility prior to ED arrival.  Now she is feeling like her sugar might be dropping.  Past Medical History:  Diagnosis Date  . Acute myocardial infarction of other anterior wall, initial episode of care   . Anginal pain ([place])   . Anxiety   . Arthritis   . Asthma   . CAD (coronary artery disease)   . CHF (congestive heart failure) ([place])   . Depression   . Diabetes mellitus    2002; no meds since 2004  . Dyslipidemia, goal LDL below 70 10/26/2013  . Dysrhythmia   . GERD (gastroesophageal reflux disease)   . Headache(784.0)   . Obesity   . Paroxysmal ventricular tachycardia ([place])   . Shortness of breath   . STEMI (ST elevation myocardial infarction) ([place])   . Tobacco abuse     Patient Active Problem List   Diagnosis Date Noted  . Trichomonal cystitis and urethritis 12/28/2019  . Routine general medical examination at a health care facility 11/10/2019  . Deficiency anemia 11/10/2019  . Cervical cancer screening 11/10/2019  . Iron deficiency anemia due to chronic blood loss 11/10/2019  . Chronic  diastolic (congestive) heart failure ([place]) 01/23/2019  . Anxiety 01/22/2019  . Panic anxiety syndrome 01/05/2019  . Long-term current use of opiate analgesic 02/28/2018  . Microalbuminuria due to type 2 diabetes mellitus ([place]) 06/24/2017  . Visit for screening mammogram 06/23/2017  . Asthma 09/18/2016  . DDD (degenerative disc disease), lumbar 01/02/2015  . Atherosclerosis of native artery of extremity with intermittent claudication ([place]) 03/05/2014  . Tobacco use disorder 03/05/2014  . Coronary artery disease 11/06/2013  . Dyslipidemia, goal LDL below 70 10/26/2013  . Obesity   . Uncontrolled diabetes mellitus type 2 with atherosclerosis of arteries of extremities ([place]) 07/07/2012  . DJD (degenerative joint disease) of knee 07/07/2012    Past Surgical History:  Procedure Laterality Date  . CESAREAN SECTION    . CESAREAN SECTION  1994,1997,2000  . CHOLECYSTECTOMY    . CORONARY ANGIOPLASTY WITH STENT PLACEMENT    . GALLBLADDER SURGERY    . INCISION AND DRAINAGE PERIRECTAL ABSCESS N/A 01/23/2019   Procedure: IRRIGATION AND DEBRIDEMENT PERIRECTAL ABSCESS;  Surgeon: Rolm Bookbinder, MD;  Location: [facility];  Service: General;  Laterality: N/A;  . LEFT HEART CATH N/A 10/24/2013   Procedure: LEFT HEART CATH;  Surgeon: Lorretta Harp, MD;  Location: [facility] CATH LAB;  Service: Cardiovascular;  Laterality: N/A;  . LEFT HEART CATHETERIZATION WITH CORONARY ANGIOGRAM N/A 10/26/2013   Procedure: LEFT HEART CATHETERIZATION WITH  CORONARY ANGIOGRAM;  Surgeon: Burnell Blanks, MD;  Location: [facility] CATH LAB;  Service: Cardiovascular;  Laterality: N/A;  . LEFT HEART CATHETERIZATION WITH CORONARY ANGIOGRAM N/A 03/29/2014   Procedure: LEFT HEART CATHETERIZATION WITH CORONARY ANGIOGRAM;  Surgeon: Lorretta Harp, MD;  Location: [facility] CATH LAB;  Service: Cardiovascular;  Laterality: N/A;  . TUBAL LIGATION     2000     OB History    Gravida  4   Para  3   Term  3   Preterm      AB  1   Living  3      SAB  1   IAB      Ectopic      Multiple      Live Births              Family History  Problem Relation Age of Onset  . Arthritis Mother   . Diabetes Mother   . Heart disease Father   . Stroke Father   . Diabetes Maternal Grandfather   . Cancer Neg Hx   . Alcohol abuse Neg Hx   . Early death Neg Hx   . Kidney disease Neg Hx   . Hypertension Neg Hx   . Hyperlipidemia Neg Hx   . Thyroid disease Neg Hx     Social History   Tobacco Use  . Smoking status: Current Every Day Smoker    Packs/day: 0.50    Years: 20.00    Pack years: 10.00    Types: Cigarettes  . Smokeless tobacco: Never Used  . Tobacco comment: 8-10 cigs/day  Vaping Use  . Vaping Use: Never used  Substance Use Topics  . Alcohol use: Yes    Comment: occasional  . Drug use: No    Home Medications Prior to Admission medications   Medication Sig Start Date End Date Taking? Authorizing Provider  ALPRAZolam Duanne Moron) 0.5 MG tablet Take 1 tablet (0.5 mg total) by mouth 2 (two) times daily as needed for anxiety. 09/21/20  Yes [name], MD  aspirin EC 81 MG tablet Take 1 tablet (81 mg total) by mouth daily. 02/28/18  Yes [name], MD  atorvastatin (LIPITOR) 20 MG tablet Take 1 tablet (20 mg total) by mouth daily. 07/16/20  Yes [name], MD  dapagliflozin propanediol (FARXIGA) 10 MG TABS tablet Take 1 tablet (10 mg total) by mouth daily. 07/16/20  Yes [name], MD  Fluticasone-Umeclidin-Vilant (TRELEGY ELLIPTA) 100-62.5-25 MCG/INH AEPB Inhale 1 puff into the lungs daily. 07/16/20  Yes [name], MD  insulin glargine, 2 Unit Dial, (TOUJEO MAX SOLOSTAR) 300 UNIT/ML Solostar Pen Inject 50 Units into the skin daily. 07/16/20  Yes [name], MD  Multiple Vitamin (MULTI-VITAMIN DAILY PO) Take 1 tablet by mouth daily.   Yes [provider]  nitroGLYCERIN (NITROSTAT) 0.4 MG SL tablet Place 1 tablet (0.4 mg total) under the tongue every 5 (five) minutes as needed for chest  pain (Hold for SBP <100). 08/14/20  Yes Lorretta Harp, MD  vitamin B-12 (CYANOCOBALAMIN) 1000 MCG tablet Take 2,000 mcg by mouth daily.   Yes [provider]  vitamin C (ASCORBIC ACID) 500 MG tablet Take 1,000 mg by mouth daily.   Yes [provider]  blood glucose meter kit and supplies Dispense based on patient and insurance preference. Use up to four times daily as directed. (FOR ICD-10 E10.9, E11.9). 02/06/19   Patrecia Pour, MD  Insulin Pen Needle (  BD PEN NEEDLE NANO 2ND GEN) 32G X 4 MM MISC Use to inject insulin once per day. DX E11.8 02/23/19   [name], MD  Semaglutide (RYBELSUS) 3 MG TABS Take 1 tablet by mouth daily. 07/16/20   [name], MD    Allergies    Erythromycin, Penicillins, Metformin and related, Viibryd [vilazodone hcl], and Bee venom  Review of Systems   Review of Systems  Respiratory: Negative for shortness of breath.   Cardiovascular: Negative for chest pain.  Gastrointestinal: Negative for nausea and vomiting.  Neurological: Positive for dizziness.  All other systems reviewed and are negative.   Physical Exam Updated Vital Signs BP (!) 145/87   Pulse 84   Temp 98.3 F (36.8 C) (Oral)   Resp 20   SpO2 100%   Physical Exam Vitals and nursing note reviewed.  Constitutional:      Appearance: She is well-developed.  HENT:     Head: Normocephalic and atraumatic.  Eyes:     Pupils: Pupils are equal, round, and reactive to light.  Cardiovascular:     Rate and Rhythm: Normal rate and regular rhythm.     Heart sounds: Normal heart sounds.  Pulmonary:     Effort: Pulmonary effort is normal. No respiratory distress.  Abdominal:     Palpations: Abdomen is soft.     Tenderness: There is no abdominal tenderness.  Musculoskeletal:     Cervical back: Neck supple.  Skin:    General: Skin is warm and dry.  Neurological:     Mental Status: She is alert and oriented to person, place, and time.     ED Results / Procedures /  Treatments   Labs (all labs ordered are listed, but only abnormal results are displayed) Labs Reviewed  COMPREHENSIVE METABOLIC PANEL - Abnormal; Notable for the following components:      Result Value   CO2 19 (*)    Creatinine, Ser 1.23 (*)    Calcium 8.7 (*)    GFR, Estimated 56 (*)    All other components within normal limits  CBC WITH DIFFERENTIAL/PLATELET - Abnormal; Notable for the following components:   Monocytes Absolute 1.1 (*)    All other components within normal limits  CBG MONITORING, ED - Abnormal; Notable for the following components:   Glucose-Capillary 226 (*)    All other components within normal limits  URINALYSIS, ROUTINE W REFLEX MICROSCOPIC  CBG MONITORING, ED  CBG MONITORING, ED    EKG None  Radiology No results found.  Procedures Procedures   Medications Ordered in ED Medications - No data to display  ED Course  I have reviewed the triage vital signs and the nursing notes.  Pertinent labs & imaging results that were available during my care of the patient were reviewed by me and considered in my medical decision making (see chart for details).    MDM Rules/Calculators/A&P                          45 year old comes in a chief complaint of elevated blood sugar.  It appears that her sugar was over 300 at her working facility, which is [facility].  She took 15 units of Humalog prior to ED arrival.  Her sugar is now dropping, she actually feels like her blood sugar is lowER.   Repeat CBC ordered and her blood sugar indeed is in the 80s.  She has been given food.  Blood work reveals low  bicarb, but no anion gap.  Doubt DKA.  Patient is comfortable going home at this time, strict ER return precautions discussed.  Hyperglycemia is likely because of medication nonadherence due to her moving.  Final Clinical Impression(s) / ED Diagnoses Final diagnoses:  Hyperglycemia    Rx / DC Orders ED Discharge Orders    None       Varney Biles, MD 01/25/21 1517

## 2021-01-25 NOTE — ED Notes (Signed)
Patient eating and drinking without difficulty.

## 2021-02-01 ENCOUNTER — Ambulatory Visit (HOSPITAL_COMMUNITY)
Admission: EM | Admit: 2021-02-01 | Discharge: 2021-02-01 | Disposition: A | Discharge status: Home / Self Care | Payer: Commercial Managed Care - PPO | Attending: Medical Oncology | Admitting: Medical Oncology

## 2021-02-01 ENCOUNTER — Encounter (HOSPITAL_COMMUNITY): Payer: Self-pay

## 2021-02-01 ENCOUNTER — Emergency Department (HOSPITAL_BASED_OUTPATIENT_CLINIC_OR_DEPARTMENT_OTHER)
Admission: EM | Admit: 2021-02-01 | Discharge: 2021-02-01 | Disposition: A | Discharge status: Home / Self Care | Payer: Commercial Managed Care - PPO

## 2021-02-01 ENCOUNTER — Other Ambulatory Visit: Payer: Self-pay

## 2021-02-01 DIAGNOSIS — N898 Other specified noninflammatory disorders of vagina: Secondary | ICD-10-CM

## 2021-02-01 LAB — POCT URINALYSIS DIPSTICK, ED / UC
Bilirubin Urine: NEGATIVE
Glucose, UA: >=1000 mg/dL — AB
Ketones, ur: NEGATIVE mg/dL
Leukocytes,Ua: NEGATIVE
Nitrite: NEGATIVE
Protein, ur: 30 mg/dL — AB
Specific Gravity, Urine: <=1.005 (ref 1.005–1.030)
Urobilinogen, UA: 0.2 mg/dL (ref 0.0–1.0)
pH: 5.5 (ref 5.0–8.0)

## 2021-02-01 LAB — CBG MONITORING, ED: Glucose-Capillary: 172 mg/dL — ABNORMAL HIGH (ref 70–99)

## 2021-02-01 NOTE — ED Notes (Signed)
Pt cussing and loud with security and off [employer] prior to walking out of facility. Due to this, pt will be removed from system.

## 2021-02-01 NOTE — ED Triage Notes (Signed)
Pt c/o burning in vaginal area x 1 week after having sex with her partner. Pt states she has had itching and discomfort. Pt states it burns when she urinates and states she has a tingling feeling. Pt states she noticed an odd odor. Pt states she has been spotting pink.

## 2021-02-01 NOTE — ED Provider Notes (Signed)
[place]    CSN: [id number] Arrival date & time: 02/01/21  1250      History   Chief Complaint Chief Complaint  Patient presents with  . SEXUALLY TRANSMITTED DISEASE    HPI Maureen Price is a 45 y.o. female.   HPI   Vaginal Irritation: Pt c/o burning in vaginal area x 1 week after having sex with her partner. She reports that she was separated with her husband for 2 years and recently they reconnected. She states that she has not had any other partners since him but has found condoms at his house and knows that he has been sexually active with others. Pt states she has had itching and discomfort. Pt states it burns when she urinates and states she has a tingling feeling. Pt states she noticed an odd odor. Pt states she has been spotting pink. All of these symptoms started after unprotected sexual intercourse with her husband. No fevers, flu like symptoms, or significant abdominal pain. [facility]: current   Past Medical History:  Diagnosis Date  . Acute myocardial infarction of other anterior wall, initial episode of care   . Anginal pain ([place])   . Anxiety   . Arthritis   . Asthma   . CAD (coronary artery disease)   . CHF (congestive heart failure) ([place])   . Depression   . Diabetes mellitus    2002; no meds since 2004  . Dyslipidemia, goal LDL below 70 10/26/2013  . Dysrhythmia   . GERD (gastroesophageal reflux disease)   . Headache(784.0)   . Obesity   . Paroxysmal ventricular tachycardia ([place])   . Shortness of breath   . STEMI (ST elevation myocardial infarction) ([place])   . Tobacco abuse     Patient Active Problem List   Diagnosis Date Noted  . Trichomonal cystitis and urethritis 12/28/2019  . Routine general medical examination at a health care facility 11/10/2019  . Deficiency anemia 11/10/2019  . Cervical cancer screening 11/10/2019  . Iron deficiency anemia due to chronic blood loss 11/10/2019  . Chronic diastolic (congestive) heart failure ([place])  01/23/2019  . Anxiety 01/22/2019  . Panic anxiety syndrome 01/05/2019  . Long-term current use of opiate analgesic 02/28/2018  . Microalbuminuria due to type 2 diabetes mellitus ([place]) 06/24/2017  . Visit for screening mammogram 06/23/2017  . Asthma 09/18/2016  . DDD (degenerative disc disease), lumbar 01/02/2015  . Atherosclerosis of native artery of extremity with intermittent claudication ([place]) 03/05/2014  . Tobacco use disorder 03/05/2014  . Coronary artery disease 11/06/2013  . Dyslipidemia, goal LDL below 70 10/26/2013  . Obesity   . Uncontrolled diabetes mellitus type 2 with atherosclerosis of arteries of extremities ([place]) 07/07/2012  . DJD (degenerative joint disease) of knee 07/07/2012    Past Surgical History:  Procedure Laterality Date  . CESAREAN SECTION    . CESAREAN SECTION  1994,1997,2000  . CHOLECYSTECTOMY    . CORONARY ANGIOPLASTY WITH STENT PLACEMENT    . GALLBLADDER SURGERY    . INCISION AND DRAINAGE PERIRECTAL ABSCESS N/A 01/23/2019   Procedure: IRRIGATION AND DEBRIDEMENT PERIRECTAL ABSCESS;  Surgeon: Rolm Bookbinder, MD;  Location: [facility];  Service: General;  Laterality: N/A;  . LEFT HEART CATH N/A 10/24/2013   Procedure: LEFT HEART CATH;  Surgeon: Lorretta Harp, MD;  Location: [facility] CATH LAB;  Service: Cardiovascular;  Laterality: N/A;  . LEFT HEART CATHETERIZATION WITH CORONARY ANGIOGRAM N/A 10/26/2013   Procedure: LEFT HEART CATHETERIZATION WITH CORONARY ANGIOGRAM;  Surgeon: Burnell Blanks,  MD;  Location: [facility] CATH LAB;  Service: Cardiovascular;  Laterality: N/A;  . LEFT HEART CATHETERIZATION WITH CORONARY ANGIOGRAM N/A 03/29/2014   Procedure: LEFT HEART CATHETERIZATION WITH CORONARY ANGIOGRAM;  Surgeon: Lorretta Harp, MD;  Location: [facility] CATH LAB;  Service: Cardiovascular;  Laterality: N/A;  . TUBAL LIGATION     2000    OB History    Gravida  4   Para  3   Term  3   Preterm      AB  1   Living  3     SAB  1   IAB      Ectopic       Multiple      Live Births               Home Medications    Prior to Admission medications   Medication Sig Start Date End Date Taking? Authorizing Provider  ALPRAZolam Duanne Moron) 0.5 MG tablet Take 1 tablet (0.5 mg total) by mouth 2 (two) times daily as needed for anxiety. 09/21/20   [name], MD  aspirin EC 81 MG tablet Take 1 tablet (81 mg total) by mouth daily. 02/28/18   [name], MD  atorvastatin (LIPITOR) 20 MG tablet Take 1 tablet (20 mg total) by mouth daily. 07/16/20   [name], MD  blood glucose meter kit and supplies Dispense based on patient and insurance preference. Use up to four times daily as directed. (FOR ICD-10 E10.9, E11.9). 02/06/19   Patrecia Pour, MD  dapagliflozin propanediol (FARXIGA) 10 MG TABS tablet Take 1 tablet (10 mg total) by mouth daily. 07/16/20   [name], MD  Fluticasone-Umeclidin-Vilant (TRELEGY ELLIPTA) 100-62.5-25 MCG/INH AEPB Inhale 1 puff into the lungs daily. 07/16/20   [name], MD  insulin glargine, 2 Unit Dial, (TOUJEO MAX SOLOSTAR) 300 UNIT/ML Solostar Pen Inject 50 Units into the skin daily. 07/16/20   [name], MD  Insulin Pen Needle (BD PEN NEEDLE NANO 2ND GEN) 32G X 4 MM MISC Use to inject insulin once per day. DX E11.8 02/23/19   [name], MD  Multiple Vitamin (MULTI-VITAMIN DAILY PO) Take 1 tablet by mouth daily.    [provider]  nitroGLYCERIN (NITROSTAT) 0.4 MG SL tablet Place 1 tablet (0.4 mg total) under the tongue every 5 (five) minutes as needed for chest pain (Hold for SBP <100). 08/14/20   Lorretta Harp, MD  Semaglutide (RYBELSUS) 3 MG TABS Take 1 tablet by mouth daily. 07/16/20   [name], MD  vitamin B-12 (CYANOCOBALAMIN) 1000 MCG tablet Take 2,000 mcg by mouth daily.    [provider]  vitamin C (ASCORBIC ACID) 500 MG tablet Take 1,000 mg by mouth daily.    [provider]    Family History Family History  Problem Relation Age of Onset  .  Arthritis Mother   . Diabetes Mother   . Heart disease Father   . Stroke Father   . Diabetes Maternal Grandfather   . Cancer Neg Hx   . Alcohol abuse Neg Hx   . Early death Neg Hx   . Kidney disease Neg Hx   . Hypertension Neg Hx   . Hyperlipidemia Neg Hx   . Thyroid disease Neg Hx     Social History Social History   Tobacco Use  . Smoking status: Current Every Day Smoker    Packs/day: 0.50    Years: 20.00    Pack years:  10.00    Types: Cigarettes  . Smokeless tobacco: Never Used  . Tobacco comment: 8-10 cigs/day  Vaping Use  . Vaping Use: Never used  Substance Use Topics  . Alcohol use: Yes    Comment: occasional  . Drug use: No     Allergies   Erythromycin, Penicillins, Metformin and related, Viibryd [vilazodone hcl], and Bee venom   Review of Systems Review of Systems  As stated above in HPI Physical Exam Triage Vital Signs ED Triage Vitals  Enc Vitals Group     BP 02/01/21 1311 (!) 149/81     Pulse Rate 02/01/21 1311 91     Resp 02/01/21 1311 20     Temp 02/01/21 1311 98.4 F (36.9 C)     Temp Source 02/01/21 1311 Oral     SpO2 02/01/21 1311 100 %     Weight --      Height --      Head Circumference --      Peak Flow --      Pain Score 02/01/21 1308 0     Pain Loc --      Pain Edu? --      Excl. in [place]? --    No data found.  Updated Vital Signs BP (!) 149/81 (BP Location: Right Arm)   Pulse 91   Temp 98.4 F (36.9 C) (Oral)   Resp 20   LMP  (LMP Unknown)   SpO2 100%   Physical Exam Vitals and nursing note reviewed.  Constitutional:      General: She is not in acute distress.    Appearance: Normal appearance. She is not ill-appearing, toxic-appearing or diaphoretic.  HENT:     Head: Normocephalic and atraumatic.  Cardiovascular:     Rate and Rhythm: Normal rate and regular rhythm.     Pulses: Normal pulses.     Heart sounds: Normal heart sounds.  Pulmonary:     Effort: Pulmonary effort is normal.     Breath sounds: Normal breath  sounds.  Abdominal:     General: Abdomen is flat. Bowel sounds are normal. There is no distension.     Palpations: Abdomen is soft. There is no mass.     Tenderness: There is no abdominal tenderness. There is no right CVA tenderness, left CVA tenderness, guarding or rebound.     Hernia: No hernia is present.  Lymphadenopathy:     Cervical: No cervical adenopathy.  Neurological:     Mental Status: She is alert and oriented to person, place, and time.      UC Treatments / Results  Labs (all labs ordered are listed, but only abnormal results are displayed) Labs Reviewed  POCT URINALYSIS DIPSTICK, ED / UC - Abnormal; Notable for the following components:      Result Value   Glucose, UA >=1000 (*)    Hgb urine dipstick LARGE (*)    Protein, ur 30 (*)    All other components within normal limits  CERVICOVAGINAL ANCILLARY ONLY    EKG   Radiology No results found.  Procedures Procedures (including critical care time)  Medications Ordered in UC Medications - No data to display  Initial Impression / Assessment and Plan / UC Course  I have reviewed the triage vital signs and the nursing notes.  Pertinent labs & imaging results that were available during my care of the patient were reviewed by me and considered in my medical decision making (see chart for details).  New.  Testing UA, wet prep, and STI blood testing today. Sounds like potentially bacterial vaginosis which we discussed but she would like to hold off on treatment until all labs are back. She uses sanitary pads so retained tampon risk is zero.    Final Clinical Impressions(s) / UC Diagnoses   Final diagnoses:  None   Discharge Instructions   None    ED Prescriptions    None     PDMP not reviewed this encounter.   Hughie Closs, [place] 02/01/21 1420

## 2021-02-04 LAB — CERVICOVAGINAL ANCILLARY ONLY
Bacterial Vaginitis (gardnerella): POSITIVE — AB
Candida Glabrata: NEGATIVE
Candida Vaginitis: NEGATIVE
Chlamydia: NEGATIVE
Comment: NEGATIVE
Comment: NEGATIVE
Comment: NEGATIVE
Comment: NEGATIVE
Comment: NEGATIVE
Comment: NORMAL
Neisseria Gonorrhea: NEGATIVE
Trichomonas: NEGATIVE

## 2021-02-05 ENCOUNTER — Telehealth (HOSPITAL_COMMUNITY): Payer: Self-pay | Admitting: Emergency Medicine

## 2021-02-05 MED ORDER — METRONIDAZOLE 500 MG PO TABS: 500.0000 mg | ORAL_TABLET | Freq: Two times a day (BID) | ORAL | 0 refills | Status: DC

## 2021-03-25 ENCOUNTER — Other Ambulatory Visit: Payer: Self-pay | Admitting: Internal Medicine

## 2021-03-25 DIAGNOSIS — F411 Generalized anxiety disorder: Secondary | ICD-10-CM

## 2021-04-13 ENCOUNTER — Emergency Department (HOSPITAL_COMMUNITY): Payer: Commercial Managed Care - PPO

## 2021-04-13 ENCOUNTER — Emergency Department (HOSPITAL_COMMUNITY)
Admission: EM | Admit: 2021-04-13 | Discharge: 2021-04-13 | Disposition: A | Discharge status: Home / Self Care | Payer: Commercial Managed Care - PPO | Attending: Emergency Medicine | Admitting: Emergency Medicine

## 2021-04-13 ENCOUNTER — Other Ambulatory Visit: Payer: Self-pay

## 2021-04-13 ENCOUNTER — Encounter (HOSPITAL_COMMUNITY): Payer: Self-pay | Admitting: Emergency Medicine

## 2021-04-13 DIAGNOSIS — Z7951 Long term (current) use of inhaled steroids: Secondary | ICD-10-CM | POA: Diagnosis not present

## 2021-04-13 DIAGNOSIS — Z955 Presence of coronary angioplasty implant and graft: Secondary | ICD-10-CM | POA: Diagnosis not present

## 2021-04-13 DIAGNOSIS — J45909 Unspecified asthma, uncomplicated: Secondary | ICD-10-CM | POA: Diagnosis not present

## 2021-04-13 DIAGNOSIS — R109 Unspecified abdominal pain: Secondary | ICD-10-CM | POA: Insufficient documentation

## 2021-04-13 DIAGNOSIS — E785 Hyperlipidemia, unspecified: Secondary | ICD-10-CM | POA: Insufficient documentation

## 2021-04-13 DIAGNOSIS — F17210 Nicotine dependence, cigarettes, uncomplicated: Secondary | ICD-10-CM | POA: Diagnosis not present

## 2021-04-13 DIAGNOSIS — Z7982 Long term (current) use of aspirin: Secondary | ICD-10-CM | POA: Insufficient documentation

## 2021-04-13 DIAGNOSIS — Z7984 Long term (current) use of oral hypoglycemic drugs: Secondary | ICD-10-CM | POA: Diagnosis not present

## 2021-04-13 DIAGNOSIS — I2510 Atherosclerotic heart disease of native coronary artery without angina pectoris: Secondary | ICD-10-CM | POA: Insufficient documentation

## 2021-04-13 DIAGNOSIS — N939 Abnormal uterine and vaginal bleeding, unspecified: Secondary | ICD-10-CM | POA: Diagnosis present

## 2021-04-13 DIAGNOSIS — Z79899 Other long term (current) drug therapy: Secondary | ICD-10-CM | POA: Insufficient documentation

## 2021-04-13 DIAGNOSIS — Z794 Long term (current) use of insulin: Secondary | ICD-10-CM | POA: Diagnosis not present

## 2021-04-13 DIAGNOSIS — D219 Benign neoplasm of connective and other soft tissue, unspecified: Secondary | ICD-10-CM

## 2021-04-13 DIAGNOSIS — N921 Excessive and frequent menstruation with irregular cycle: Secondary | ICD-10-CM

## 2021-04-13 DIAGNOSIS — E1169 Type 2 diabetes mellitus with other specified complication: Secondary | ICD-10-CM | POA: Insufficient documentation

## 2021-04-13 DIAGNOSIS — I5032 Chronic diastolic (congestive) heart failure: Secondary | ICD-10-CM | POA: Diagnosis not present

## 2021-04-13 DIAGNOSIS — N9489 Other specified conditions associated with female genital organs and menstrual cycle: Secondary | ICD-10-CM | POA: Insufficient documentation

## 2021-04-13 DIAGNOSIS — E1129 Type 2 diabetes mellitus with other diabetic kidney complication: Secondary | ICD-10-CM | POA: Insufficient documentation

## 2021-04-13 DIAGNOSIS — D259 Leiomyoma of uterus, unspecified: Secondary | ICD-10-CM | POA: Insufficient documentation

## 2021-04-13 LAB — CBC WITH DIFFERENTIAL/PLATELET
Abs Immature Granulocytes: 0.04 10*3/uL (ref 0.00–0.07)
Basophils Absolute: 0.1 10*3/uL (ref 0.0–0.1)
Basophils Relative: 1 %
Eosinophils Absolute: 0.1 10*3/uL (ref 0.0–0.5)
Eosinophils Relative: 2 %
HCT: 34.7 % — ABNORMAL LOW (ref 36.0–46.0)
Hemoglobin: 11.7 g/dL — ABNORMAL LOW (ref 12.0–15.0)
Immature Granulocytes: 0 %
Lymphocytes Relative: 33 %
Lymphs Abs: 3 10*3/uL (ref 0.7–4.0)
MCH: 28.4 pg (ref 26.0–34.0)
MCHC: 33.7 g/dL (ref 30.0–36.0)
MCV: 84.2 fL (ref 80.0–100.0)
Monocytes Absolute: 0.7 10*3/uL (ref 0.1–1.0)
Monocytes Relative: 7 %
Neutro Abs: 5.2 10*3/uL (ref 1.7–7.7)
Neutrophils Relative %: 57 %
Platelets: 271 10*3/uL (ref 150–400)
RBC: 4.12 MIL/uL (ref 3.87–5.11)
RDW: 13.5 % (ref 11.5–15.5)
WBC: 9.1 10*3/uL (ref 4.0–10.5)
nRBC: 0 % (ref 0.0–0.2)

## 2021-04-13 LAB — I-STAT BETA HCG BLOOD, ED (MC, WL, AP ONLY): I-stat hCG, quantitative: <5 m[IU]/mL (ref <5)

## 2021-04-13 MED ORDER — MEDROXYPROGESTERONE ACETATE 10 MG PO TABS: 20.0000 mg | ORAL_TABLET | Freq: Every day | ORAL | 0 refills | Status: DC

## 2021-04-13 NOTE — ED Notes (Signed)
Pt to US.

## 2021-04-13 NOTE — ED Provider Notes (Signed)
[facility] EMERGENCY DEPARTMENT Provider Note   CSN: [id number] Arrival date & time: 04/13/21  1011     History No chief complaint on file.   Maureen Price is a 45 y.o. female.  The history is provided by the patient.  Vaginal Bleeding Quality:  Bright red and spotting Severity:  Mild Duration:  3 weeks Timing:  Constant Progression:  Unchanged Chronicity:  New Menstrual history:  Regular Number of pads used:  1 Possible pregnancy: no   Context: at rest   Context: not foreign body   Associated symptoms: abdominal pain and fatigue   Associated symptoms: no back pain, no dizziness, no dysuria, no fever, no nausea and no vaginal discharge    45 year old female with medical history below presenting with abnormal uterine bleeding.  The patient states that her normal menstrual cycle previously would last 6 days at a time.  She would have heavy uterine bleeding during that time.  She states that her most recent menstrual cycle started around 03/27/2021 and she has had persistent bleeding every day since then.  She has gone through around 1 pad per day.  She endorses some lightheadedness and fatigue but no shortness of breath.  She does have a history of anemia after intubation during COVID-19 requiring subsequent iron transfusion after an ICU stay.  She endorses intermittent abdominal cramping.  She denies any fevers or chills.  Past Medical History:  Diagnosis Date   Acute myocardial infarction of other anterior wall, initial episode of care    Anginal pain ([place])    Anxiety    Arthritis    Asthma    CAD (coronary artery disease)    CHF (congestive heart failure) ([place])    Depression    Diabetes mellitus    2002; no meds since 2004   Dyslipidemia, goal LDL below 70 10/26/2013   Dysrhythmia    GERD (gastroesophageal reflux disease)    Headache(784.0)    Obesity    Paroxysmal ventricular tachycardia (H[facility])    Shortness of breath    STEMI (ST elevation  myocardial infarction) ([place])    Tobacco abuse     Patient Active Problem List   Diagnosis Date Noted   Trichomonal cystitis and urethritis 12/28/2019   Routine general medical examination at a health care facility 11/10/2019   Deficiency anemia 11/10/2019   Cervical cancer screening 11/10/2019   Iron deficiency anemia due to chronic blood loss 11/10/2019   Chronic diastolic (congestive) heart failure ([place]) 01/23/2019   Anxiety 01/22/2019   Panic anxiety syndrome 01/05/2019   Long-term current use of opiate analgesic 02/28/2018   Microalbuminuria due to type 2 diabetes mellitus ([place]) 06/24/2017   Visit for screening mammogram 06/23/2017   Asthma 09/18/2016   DDD (degenerative disc disease), lumbar 01/02/2015   Atherosclerosis of native artery of extremity with intermittent claudication ([place]) 03/05/2014   Tobacco use disorder 03/05/2014   Coronary artery disease 11/06/2013   Dyslipidemia, goal LDL below 70 10/26/2013   Obesity    Uncontrolled diabetes mellitus type 2 with atherosclerosis of arteries of extremities ([place]) 07/07/2012   DJD (degenerative joint disease) of knee 07/07/2012    Past Surgical History:  Procedure Laterality Date   CESAREAN SECTION     CESAREAN SECTION  [phone]   CHOLECYSTECTOMY     CORONARY ANGIOPLASTY WITH STENT PLACEMENT     [place] N/A 01/23/2019   Procedure: IRRIGATION AND DEBRIDEMENT PERIRECTAL ABSCESS;  Surgeon: Donne Hazel,  Rodman Key, MD;  Location: [facility];  Service: General;  Laterality: N/A;   LEFT HEART CATH N/A 10/24/2013   Procedure: LEFT HEART CATH;  Surgeon: Lorretta Harp, MD;  Location: [facility] CATH LAB;  Service: Cardiovascular;  Laterality: N/A;   LEFT HEART CATHETERIZATION WITH CORONARY ANGIOGRAM N/A 10/26/2013   Procedure: LEFT HEART CATHETERIZATION WITH CORONARY ANGIOGRAM;  Surgeon: Burnell Blanks, MD;  Location: [facility] CATH LAB;  Service: Cardiovascular;  Laterality: N/A;    LEFT HEART CATHETERIZATION WITH CORONARY ANGIOGRAM N/A 03/29/2014   Procedure: LEFT HEART CATHETERIZATION WITH CORONARY ANGIOGRAM;  Surgeon: Lorretta Harp, MD;  Location: [facility] CATH LAB;  Service: Cardiovascular;  Laterality: N/A;   TUBAL LIGATION     2000     OB History     Gravida  4   Para  3   Term  3   Preterm      AB  1   Living  3      SAB  1   IAB      Ectopic      Multiple      Live Births              Family History  Problem Relation Age of Onset   Arthritis Mother    Diabetes Mother    Heart disease Father    Stroke Father    Diabetes Maternal Grandfather    Cancer Neg Hx    Alcohol abuse Neg Hx    Early death Neg Hx    Kidney disease Neg Hx    Hypertension Neg Hx    Hyperlipidemia Neg Hx    Thyroid disease Neg Hx     Social History   Tobacco Use   Smoking status: Every Day    Packs/day: 0.50    Years: 20.00    Pack years: 10.00    Types: Cigarettes   Smokeless tobacco: Never   Tobacco comments:    8-10 cigs/day  Vaping Use   Vaping Use: Never used  Substance Use Topics   Alcohol use: Yes    Comment: occasional   Drug use: No    Home Medications Prior to Admission medications   Medication Sig Start Date End Date Taking? Authorizing Provider  medroxyPROGESTERone (PROVERA) 10 MG tablet Take 2 tablets (20 mg total) by mouth daily for 10 days. 04/13/21 04/23/21 Yes Regan Lemming, MD  ALPRAZolam Duanne Moron) 0.5 MG tablet Take 1 tablet (0.5 mg total) by mouth 2 (two) times daily as needed for anxiety. 09/21/20   [name], MD  aspirin EC 81 MG tablet Take 1 tablet (81 mg total) by mouth daily. 02/28/18   [name], MD  atorvastatin (LIPITOR) 20 MG tablet Take 1 tablet (20 mg total) by mouth daily. 07/16/20   [name], MD  blood glucose meter kit and supplies Dispense based on patient and insurance preference. Use up to four times daily as directed. (FOR ICD-10 E10.9, E11.9). 02/06/19   Patrecia Pour, MD  dapagliflozin  propanediol (FARXIGA) 10 MG TABS tablet Take 1 tablet (10 mg total) by mouth daily. 07/16/20   [name], MD  Fluticasone-Umeclidin-Vilant (TRELEGY ELLIPTA) 100-62.5-25 MCG/INH AEPB Inhale 1 puff into the lungs daily. 07/16/20   [name], MD  insulin glargine, 2 Unit Dial, (TOUJEO MAX SOLOSTAR) 300 UNIT/ML Solostar Pen Inject 50 Units into the skin daily. 07/16/20   [name], MD  Insulin Pen Needle (BD PEN NEEDLE NANO 2ND GEN) 32G X  4 MM MISC Use to inject insulin once per day. DX E11.8 02/23/19   [name], MD  metroNIDAZOLE (FLAGYL) 500 MG tablet Take 1 tablet (500 mg total) by mouth 2 (two) times daily. 02/05/21   Chase Picket, MD  Multiple Vitamin (MULTI-VITAMIN DAILY PO) Take 1 tablet by mouth daily.    [provider]  nitroGLYCERIN (NITROSTAT) 0.4 MG SL tablet Place 1 tablet (0.4 mg total) under the tongue every 5 (five) minutes as needed for chest pain (Hold for SBP <100). 08/14/20   Lorretta Harp, MD  Semaglutide (RYBELSUS) 3 MG TABS Take 1 tablet by mouth daily. 07/16/20   [name], MD  vitamin B-12 (CYANOCOBALAMIN) 1000 MCG tablet Take 2,000 mcg by mouth daily.    [provider]  vitamin C (ASCORBIC ACID) 500 MG tablet Take 1,000 mg by mouth daily.    [provider]    Allergies    Erythromycin, Penicillins, Metformin and related, Viibryd [vilazodone hcl], and Bee venom  Review of Systems   Review of Systems  Constitutional:  Positive for fatigue. Negative for fever.  Gastrointestinal:  Positive for abdominal pain. Negative for nausea.  Genitourinary:  Positive for vaginal bleeding. Negative for dysuria and vaginal discharge.  Musculoskeletal:  Negative for back pain.  Neurological:  Negative for dizziness.  All other systems reviewed and are negative.  Physical Exam Updated Vital Signs BP 121/63   Pulse 77   Temp 98.5 F (36.9 C)   Resp 16   LMP 03/27/2021   SpO2 100%   Physical Exam Vitals and nursing note  reviewed.  Constitutional:      General: She is not in acute distress.    Appearance: She is well-developed.  HENT:     Head: Normocephalic and atraumatic.  Eyes:     Conjunctiva/sclera: Conjunctivae normal.  Cardiovascular:     Rate and Rhythm: Normal rate and regular rhythm.     Heart sounds: No murmur heard. Pulmonary:     Effort: Pulmonary effort is normal. No respiratory distress.     Breath sounds: Normal breath sounds.  Abdominal:     Palpations: Abdomen is soft.     Tenderness: There is abdominal tenderness in the right lower quadrant, suprapubic area and left lower quadrant. There is no guarding or rebound.  Genitourinary:    Comments: Pt refused pelvic exam Musculoskeletal:     Cervical back: Neck supple.  Skin:    General: Skin is warm and dry.  Neurological:     Mental Status: She is alert.    ED Results / Procedures / Treatments   Labs (all labs ordered are listed, but only abnormal results are displayed) Labs Reviewed  CBC WITH DIFFERENTIAL/PLATELET - Abnormal; Notable for the following components:      Result Value   Hemoglobin 11.7 (*)    HCT 34.7 (*)    All other components within normal limits  I-STAT BETA HCG BLOOD, ED ([facility], [facility], [facility] ONLY)    EKG None  Radiology US Pelvis Complete  Result Date: 04/13/2021 CLINICAL DATA:  45 year old female with menorrhagia. EXAM: TRANSABDOMINAL ULTRASOUND OF PELVIS TECHNIQUE: Transabdominal ultrasound examination of the pelvis was performed including evaluation of the uterus, ovaries, adnexal regions, and pelvic cul-de-sac. COMPARISON:  11/25/2020 ultrasound FINDINGS: Uterus Measurements: 10.6 x 6.1 x 6.4 cm = volume: 217 mL. At least 3 fibroids are noted as follows: A 5.4 x 4.5 x 5 cm intramural RIGHT fundal fibroid A 1.9 x 1.9 x 1.3  cm exophytic subserosal RIGHT fundal fibroid A 5.1 x 3.7 x 4.7 cm intramural LEFT fundal fibroid. Endometrium Thickness: 4 mm.  No focal abnormality visualized. Right ovary Not visualized Left  ovary Not visualized Other findings:  No free fluid or adnexal mass identified. IMPRESSION: 1. Three uterine fibroids again noted as described. 2. No acute abnormalities identified. No free fluid or adnexal masses. 3. Bilateral ovaries not visualized. Electronically Signed   By: Margarette Canada M.D.   On: 04/13/2021 14:04    Procedures Procedures   Medications Ordered in ED Medications - No data to display  ED Course  I have reviewed the triage vital signs and the nursing notes.  Pertinent labs & imaging results that were available during my care of the patient were reviewed by me and considered in my medical decision making (see chart for details).    MDM Rules/Calculators/A&P                           45 year old female presenting with menorrhagia for the past month.  Menstrual cycle started on 03/27/2021 and has been persisting every day with use of 1 pad per day and persistent bleeding.  The patient endorses abdominal cramping.  Differential diagnosis includes uterine polyp, adenomyosis, leiomyoma, malignancy and hyperplasia.  Will evaluate further with pelvic ultrasound and CBC.  The patient does endorse fatigue.  She did decline a pelvic exam in the emergency department.  Hemoglobin reveals a stable anemia at baseline.  Pelvic ultrasound was performed which revealed evidence of uterine fibroids.  A referral was placed to gynecology for follow-up in clinic.  The patient was given a 10-day course of Provera oral contraceptive pills for her menorrhagia.  Final Clinical Impression(s) / ED Diagnoses Final diagnoses:  Menorrhagia with irregular cycle  Leiomyoma    Rx / DC Orders ED Discharge Orders          Ordered    Ambulatory referral to Gynecology        04/13/21 1440    medroxyPROGESTERone (PROVERA) 10 MG tablet  Daily        04/13/21 1441             Regan Lemming, MD 04/13/21 1445

## 2021-04-13 NOTE — ED Notes (Signed)
Back from [place]. Reports feel worse/ pain worse. 8/10. Alert, NAD, calm. Denies other sx.

## 2021-04-13 NOTE — Discharge Instructions (Addendum)
The ultrasound showed evidence of uterine fibroids.  Recommend you urgently follow-up with a gynecologist within the next week due to your abnormal uterine bleeding.  Further evaluation and management with a gynecologist is indicated.  Your hemoglobin today was stable.

## 2021-04-13 NOTE — ED Triage Notes (Signed)
Pt reports vaginal bleeding since 7/21 with intermittent abd cramping.  Reports light vaginal bleeding currently.

## 2021-04-14 ENCOUNTER — Encounter: Payer: Self-pay | Admitting: Internal Medicine

## 2021-04-14 ENCOUNTER — Ambulatory Visit (INDEPENDENT_AMBULATORY_CARE_PROVIDER_SITE_OTHER): Payer: Commercial Managed Care - PPO | Admitting: Internal Medicine

## 2021-04-14 ENCOUNTER — Other Ambulatory Visit: Payer: Self-pay

## 2021-04-14 ENCOUNTER — Other Ambulatory Visit: Payer: Self-pay | Admitting: Internal Medicine

## 2021-04-14 VITALS — BP 148/70 | HR 96 | Temp 98.9°F | Ht 65.0 in | Wt 223.0 lb

## 2021-04-14 DIAGNOSIS — R809 Proteinuria, unspecified: Secondary | ICD-10-CM

## 2021-04-14 DIAGNOSIS — I70213 Atherosclerosis of native arteries of extremities with intermittent claudication, bilateral legs: Secondary | ICD-10-CM

## 2021-04-14 DIAGNOSIS — E785 Hyperlipidemia, unspecified: Secondary | ICD-10-CM | POA: Diagnosis not present

## 2021-04-14 DIAGNOSIS — D539 Nutritional anemia, unspecified: Secondary | ICD-10-CM | POA: Diagnosis not present

## 2021-04-14 DIAGNOSIS — D259 Leiomyoma of uterus, unspecified: Secondary | ICD-10-CM

## 2021-04-14 DIAGNOSIS — Z0000 Encounter for general adult medical examination without abnormal findings: Secondary | ICD-10-CM

## 2021-04-14 DIAGNOSIS — D520 Dietary folate deficiency anemia: Secondary | ICD-10-CM

## 2021-04-14 DIAGNOSIS — E1129 Type 2 diabetes mellitus with other diabetic kidney complication: Secondary | ICD-10-CM | POA: Diagnosis not present

## 2021-04-14 DIAGNOSIS — I70209 Unspecified atherosclerosis of native arteries of extremities, unspecified extremity: Secondary | ICD-10-CM | POA: Diagnosis not present

## 2021-04-14 DIAGNOSIS — D500 Iron deficiency anemia secondary to blood loss (chronic): Secondary | ICD-10-CM | POA: Diagnosis not present

## 2021-04-14 DIAGNOSIS — E1151 Type 2 diabetes mellitus with diabetic peripheral angiopathy without gangrene: Secondary | ICD-10-CM

## 2021-04-14 DIAGNOSIS — I2510 Atherosclerotic heart disease of native coronary artery without angina pectoris: Secondary | ICD-10-CM

## 2021-04-14 DIAGNOSIS — IMO0002 Reserved for concepts with insufficient information to code with codable children: Secondary | ICD-10-CM

## 2021-04-14 DIAGNOSIS — E1165 Type 2 diabetes mellitus with hyperglycemia: Secondary | ICD-10-CM | POA: Diagnosis not present

## 2021-04-14 LAB — BASIC METABOLIC PANEL
BUN: 17 mg/dL (ref 6–23)
CO2: 23 mEq/L (ref 19–32)
Calcium: 9 mg/dL (ref 8.4–10.5)
Chloride: 104 mEq/L (ref 96–112)
Creatinine, Ser: 1.21 mg/dL — ABNORMAL HIGH (ref 0.40–1.20)
GFR: 54.35 mL/min — ABNORMAL LOW (ref >60.00)
Glucose, Bld: 245 mg/dL — ABNORMAL HIGH (ref 70–99)
Potassium: 3.9 mEq/L (ref 3.5–5.1)
Sodium: 136 mEq/L (ref 135–145)

## 2021-04-14 LAB — CBC WITH DIFFERENTIAL/PLATELET
Basophils Absolute: 0.1 10*3/uL (ref 0.0–0.1)
Basophils Relative: 0.8 % (ref 0.0–3.0)
Eosinophils Absolute: 0.2 10*3/uL (ref 0.0–0.7)
Eosinophils Relative: 2 % (ref 0.0–5.0)
HCT: 37.2 % (ref 36.0–46.0)
Hemoglobin: 12.3 g/dL (ref 12.0–15.0)
Lymphocytes Relative: 36.4 % (ref 12.0–46.0)
Lymphs Abs: 3.6 10*3/uL (ref 0.7–4.0)
MCHC: 33.2 g/dL (ref 30.0–36.0)
MCV: 84.4 fl (ref 78.0–100.0)
Monocytes Absolute: 0.7 10*3/uL (ref 0.1–1.0)
Monocytes Relative: 7.1 % (ref 3.0–12.0)
Neutro Abs: 5.3 10*3/uL (ref 1.4–7.7)
Neutrophils Relative %: 53.7 % (ref 43.0–77.0)
Platelets: 296 10*3/uL (ref 150.0–400.0)
RBC: 4.41 Mil/uL (ref 3.87–5.11)
RDW: 13.9 % (ref 11.5–15.5)
WBC: 9.9 10*3/uL (ref 4.0–10.5)

## 2021-04-14 LAB — MICROALBUMIN / CREATININE URINE RATIO
Creatinine,U: 82.3 mg/dL
Microalb Creat Ratio: 41.4 mg/g — ABNORMAL HIGH (ref 0.0–30.0)
Microalb, Ur: 34.1 mg/dL — ABNORMAL HIGH (ref 0.0–1.9)

## 2021-04-14 LAB — FERRITIN: Ferritin: 61.6 ng/mL (ref 10.0–291.0)

## 2021-04-14 LAB — HEMOGLOBIN A1C: Hgb A1c MFr Bld: 8.7 % — ABNORMAL HIGH (ref 4.6–6.5)

## 2021-04-14 LAB — VITAMIN B12: Vitamin B-12: 931 pg/mL — ABNORMAL HIGH (ref 211–911)

## 2021-04-14 LAB — IRON: Iron: 63 ug/dL (ref 42–145)

## 2021-04-14 LAB — FOLATE: Folate: 4.9 ng/mL — ABNORMAL LOW (ref >5.9)

## 2021-04-14 LAB — LIPID PANEL
Cholesterol: 151 mg/dL (ref 0–200)
HDL: 23.8 mg/dL — ABNORMAL LOW (ref >39.00)
NonHDL: 126.84
Total CHOL/HDL Ratio: 6
Triglycerides: 217 mg/dL — ABNORMAL HIGH (ref 0.0–149.0)
VLDL: 43.4 mg/dL — ABNORMAL HIGH (ref 0.0–40.0)

## 2021-04-14 LAB — TROPONIN I (HIGH SENSITIVITY): High Sens Troponin I: 5 ng/L (ref 2–17)

## 2021-04-14 LAB — LDL CHOLESTEROL, DIRECT: Direct LDL: 98 mg/dL

## 2021-04-14 MED ORDER — ATORVASTATIN CALCIUM 20 MG PO TABS: 20.0000 mg | ORAL_TABLET | Freq: Every day | ORAL | 1 refills | Status: DC

## 2021-04-14 MED ORDER — DAPAGLIFLOZIN PROPANEDIOL 10 MG PO TABS: 10.0000 mg | ORAL_TABLET | Freq: Every day | ORAL | 1 refills | Status: DC

## 2021-04-14 MED ORDER — BD PEN NEEDLE NANO 2ND GEN 32G X 4 MM MISC: 1.0000 | Freq: Four times a day (QID) | 2 refills | Status: DC

## 2021-04-14 MED ORDER — RYBELSUS 3 MG PO TABS: 1.0000 | ORAL_TABLET | Freq: Every day | ORAL | 0 refills | Status: DC

## 2021-04-14 MED ORDER — FREESTYLE LIBRE 2 READER DEVI: 1.0000 | Freq: Every day | 5 refills | Status: DC

## 2021-04-14 MED ORDER — TOUJEO MAX SOLOSTAR 300 UNIT/ML ~~LOC~~ SOPN: 50.0000 [IU] | PEN_INJECTOR | Freq: Every day | SUBCUTANEOUS | 0 refills | Status: DC

## 2021-04-14 MED ORDER — FREESTYLE LIBRE 2 SENSOR MISC: 1.0000 | Freq: Every day | 5 refills | Status: DC

## 2021-04-14 MED ORDER — ASPIRIN EC 81 MG PO TBEC: 81.0000 mg | DELAYED_RELEASE_TABLET | Freq: Every day | ORAL | 1 refills | Status: DC

## 2021-04-14 MED ORDER — NOVOLOG PENFILL 100 UNIT/ML ~~LOC~~ SOCT: 15.0000 [IU] | Freq: Three times a day (TID) | SUBCUTANEOUS | 1 refills | Status: DC

## 2021-04-14 MED ORDER — FOLIC ACID 1 MG PO TABS: 1.0000 mg | ORAL_TABLET | Freq: Every day | ORAL | 1 refills | Status: DC

## 2021-04-14 NOTE — Patient Instructions (Signed)
Health Maintenance, Female Adopting a healthy lifestyle and getting preventive care are important in promoting health and wellness. Ask your health care provider about: The right schedule for you to have regular tests and exams. Things you can do on your own to prevent diseases and keep yourself healthy. What should I know about diet, weight, and exercise? Eat a healthy diet  Eat a diet that includes plenty of vegetables, fruits, low-fat dairy products, and lean protein. Do not eat a lot of foods that are high in solid fats, added sugars, or sodium.  Maintain a healthy weight Body mass index (BMI) is used to identify weight problems. It estimates body fat based on height and weight. Your health care provider can help determineyour BMI and help you achieve or maintain a healthy weight. Get regular exercise Get regular exercise. This is one of the most important things you can do for your health. Most adults should: Exercise for at least 150 minutes each week. The exercise should increase your heart rate and make you sweat (moderate-intensity exercise). Do strengthening exercises at least twice a week. This is in addition to the moderate-intensity exercise. Spend less time sitting. Even light physical activity can be beneficial. Watch cholesterol and blood lipids Have your blood tested for lipids and cholesterol at 45 years of age, then havethis test every 5 years. Have your cholesterol levels checked more often if: Your lipid or cholesterol levels are high. You are older than 45 years of age. You are at high risk for heart disease. What should I know about cancer screening? Depending on your health history and family history, you may need to have cancer screening at various ages. This may include screening for: Breast cancer. Cervical cancer. Colorectal cancer. Skin cancer. Lung cancer. What should I know about heart disease, diabetes, and high blood pressure? Blood pressure and heart  disease High blood pressure causes heart disease and increases the risk of stroke. This is more likely to develop in people who have high blood pressure readings, are of African descent, or are overweight. Have your blood pressure checked: Every 3-5 years if you are 18-39 years of age. Every year if you are 40 years old or older. Diabetes Have regular diabetes screenings. This checks your fasting blood sugar level. Have the screening done: Once every three years after age 40 if you are at a normal weight and have a low risk for diabetes. More often and at a younger age if you are overweight or have a high risk for diabetes. What should I know about preventing infection? Hepatitis B If you have a higher risk for hepatitis B, you should be screened for this virus. Talk with your health care provider to find out if you are at risk forhepatitis B infection. Hepatitis C Testing is recommended for: Everyone born from 1945 through 1965. Anyone with known risk factors for hepatitis C. Sexually transmitted infections (STIs) Get screened for STIs, including gonorrhea and chlamydia, if: You are sexually active and are younger than 45 years of age. You are older than 45 years of age and your health care provider tells you that you are at risk for this type of infection. Your sexual activity has changed since you were last screened, and you are at increased risk for chlamydia or gonorrhea. Ask your health care provider if you are at risk. Ask your health care provider about whether you are at high risk for HIV. Your health care provider may recommend a prescription medicine to help   prevent HIV infection. If you choose to take medicine to prevent HIV, you should first get tested for HIV. You should then be tested every 3 months for as long as you are taking the medicine. Pregnancy If you are about to stop having your period (premenopausal) and you may become pregnant, seek counseling before you get  pregnant. Take 400 to 800 micrograms (mcg) of folic acid every day if you become pregnant. Ask for birth control (contraception) if you want to prevent pregnancy. Osteoporosis and menopause Osteoporosis is a disease in which the bones lose minerals and strength with aging. This can result in bone fractures. If you are 65 years old or older, or if you are at risk for osteoporosis and fractures, ask your health care provider if you should: Be screened for bone loss. Take a calcium or vitamin D supplement to lower your risk of fractures. Be given hormone replacement therapy (HRT) to treat symptoms of menopause. Follow these instructions at home: Lifestyle Do not use any products that contain nicotine or tobacco, such as cigarettes, e-cigarettes, and chewing tobacco. If you need help quitting, ask your health care provider. Do not use street drugs. Do not share needles. Ask your health care provider for help if you need support or information about quitting drugs. Alcohol use Do not drink alcohol if: Your health care provider tells you not to drink. You are pregnant, may be pregnant, or are planning to become pregnant. If you drink alcohol: Limit how much you use to 0-1 drink a day. Limit intake if you are breastfeeding. Be aware of how much alcohol is in your drink. In the [place] one drink equals one 12 oz bottle of beer (355 mL), one 5 oz glass of wine (148 mL), or one 1 oz glass of hard liquor (44 mL). General instructions Schedule regular health, dental, and eye exams. Stay current with your vaccines. Tell your health care provider if: You often feel depressed. You have ever been abused or do not feel safe at home. Summary Adopting a healthy lifestyle and getting preventive care are important in promoting health and wellness. Follow your health care provider's instructions about healthy diet, exercising, and getting tested or screened for diseases. Follow your health care provider's  instructions on monitoring your cholesterol and blood pressure. This information is not intended to replace advice given to you by your health care provider. Make sure you discuss any questions you have with your healthcare provider. Document Revised: 08/17/2018 Document Reviewed: 08/17/2018 Elsevier Patient Education  2022 [employer] Inc.  

## 2021-04-14 NOTE — Progress Notes (Signed)
tterine  Subjective:  Patient ID: Maureen Price, female    DOB: 1976/02/05  Age: 45 y.o. MRN: [id number]  CC: Annual Exam, Anemia, Coronary Artery Disease, Diabetes, and Hyperlipidemia  This visit occurred during the SARS-CoV-2 public health emergency.  Safety protocols were in place, including screening questions prior to the visit, additional usage of staff PPE, and extensive cleaning of exam room while observing appropriate contact time as indicated for disinfecting solutions.    HPI Maureen Price presents for a CPX and f/up -  She complains of a 1 month history of intermittent vaginal bleeding.  She was seen in the ED over the weekend and an ultrasound showed uterine fibroids.  She was also found to be anemic.  She has not been monitoring her blood sugars.  She denies polys.  Outpatient Medications Prior to Visit  Medication Sig Dispense Refill   ALPRAZolam (XANAX) 0.5 MG tablet Take 1 tablet (0.5 mg total) by mouth 2 (two) times daily as needed for anxiety. 60 tablet 2   blood glucose meter kit and supplies Dispense based on patient and insurance preference. Use up to four times daily as directed. (FOR ICD-10 E10.9, E11.9). 1 each 0   Fluticasone-Umeclidin-Vilant (TRELEGY ELLIPTA) 100-62.5-25 MCG/INH AEPB Inhale 1 puff into the lungs daily. 90 each 1   Multiple Vitamin (MULTI-VITAMIN DAILY PO) Take 1 tablet by mouth daily.     nitroGLYCERIN (NITROSTAT) 0.4 MG SL tablet Place 1 tablet (0.4 mg total) under the tongue every 5 (five) minutes as needed for chest pain (Hold for SBP <100). 30 tablet 2   Oxycodone HCl 10 MG TABS Take 10 mg by mouth every 8 (eight) hours as needed.     vitamin B-12 (CYANOCOBALAMIN) 1000 MCG tablet Take 2,000 mcg by mouth daily.     vitamin C (ASCORBIC ACID) 500 MG tablet Take 1,000 mg by mouth daily.     aspirin EC 81 MG tablet Take 1 tablet (81 mg total) by mouth daily. 90 tablet 1   atorvastatin (LIPITOR) 20 MG tablet Take 1 tablet (20 mg total) by  mouth daily. 90 tablet 1   dapagliflozin propanediol (FARXIGA) 10 MG TABS tablet Take 1 tablet (10 mg total) by mouth daily. 90 tablet 1   insulin glargine, 2 Unit Dial, (TOUJEO MAX SOLOSTAR) 300 UNIT/ML Solostar Pen Inject 50 Units into the skin daily. 9 mL 0   Insulin Pen Needle (BD PEN NEEDLE NANO 2ND GEN) 32G X 4 MM MISC Use to inject insulin once per day. DX E11.8 90 each 1   Semaglutide (RYBELSUS) 3 MG TABS Take 1 tablet by mouth daily. 30 tablet 0   medroxyPROGESTERone (PROVERA) 10 MG tablet Take 2 tablets (20 mg total) by mouth daily for 10 days. (Patient not taking: Reported on 04/14/2021) 20 tablet 0   metroNIDAZOLE (FLAGYL) 500 MG tablet Take 1 tablet (500 mg total) by mouth 2 (two) times daily. (Patient not taking: Reported on 04/14/2021) 14 tablet 0   Facility-Administered Medications Prior to Visit  Medication Dose Route Frequency Provider Last Rate Last Admin   fentaNYL (SUBLIMAZE) injection    Anesthesia Intra-op Suzy Bouchard, CRNA   50 mcg at 01/23/19 1308    ROS Review of Systems  Constitutional:  Negative for appetite change, diaphoresis, fatigue and unexpected weight change.  HENT: Negative.    Eyes: Negative.   Respiratory:  Negative for cough, chest tightness, shortness of breath and wheezing.   Cardiovascular:  Negative for chest pain, palpitations and leg  swelling.  Gastrointestinal:  Negative for abdominal pain, constipation, diarrhea, nausea and vomiting.  Endocrine: Negative.   Genitourinary:  Positive for vaginal bleeding. Negative for difficulty urinating, dysuria and vaginal discharge.  Musculoskeletal:  Negative for arthralgias, back pain, joint swelling and myalgias.  Skin: Negative.  Negative for color change and pallor.  Neurological: Negative.  Negative for dizziness, weakness and light-headedness.  Hematological:  Negative for adenopathy. Does not bruise/bleed easily.  Psychiatric/Behavioral: Negative.     Objective:  BP (!) 148/70 (BP Location: Right  Arm, Patient Position: Sitting, Cuff Size: Large)   Pulse 96   Temp 98.9 F (37.2 C) (Oral)   Ht 5' 5"  (1.651 m)   Wt 223 lb (101.2 kg)   LMP 03/27/2021   SpO2 97%   BMI 37.11 kg/m   BP Readings from Last 3 Encounters:  04/14/21 (!) 148/70  04/13/21 (!) 146/87  02/01/21 (!) 149/81    Wt Readings from Last 3 Encounters:  04/14/21 223 lb (101.2 kg)  11/25/20 222 lb (100.7 kg)  08/14/20 225 lb (102.1 kg)    Physical Exam Vitals reviewed.  Constitutional:      Appearance: Normal appearance.  HENT:     Nose: Nose normal.     Mouth/Throat:     Mouth: Mucous membranes are moist.  Eyes:     Conjunctiva/sclera: Conjunctivae normal.  Cardiovascular:     Rate and Rhythm: Normal rate and regular rhythm.     Heart sounds: No murmur heard.    Comments: EKG- NSR, 94 bpm Septal infarct is old Unchanged EKG Pulmonary:     Effort: Pulmonary effort is normal.     Breath sounds: No stridor. No wheezing, rhonchi or rales.  Abdominal:     General: Abdomen is flat.     Palpations: There is no mass.     Tenderness: There is no abdominal tenderness. There is no guarding.     Hernia: No hernia is present.  Musculoskeletal:     Cervical back: Neck supple.     Right lower leg: No edema.     Left lower leg: No edema.  Lymphadenopathy:     Cervical: No cervical adenopathy.  Skin:    General: Skin is warm and dry.  Neurological:     General: No focal deficit present.     Mental Status: She is alert.  Psychiatric:        Mood and Affect: Mood normal.        Behavior: Behavior normal.    Lab Results  Component Value Date   WBC 9.9 04/14/2021   HGB 12.3 04/14/2021   HCT 37.2 04/14/2021   PLT 296.0 04/14/2021   GLUCOSE 245 (H) 04/14/2021   CHOL 151 04/14/2021   TRIG 217.0 (H) 04/14/2021   HDL 23.80 (L) 04/14/2021   LDLDIRECT 98.0 04/14/2021   LDLCALC 97 11/10/2019   ALT 16 01/25/2021   AST 16 01/25/2021   NA 136 04/14/2021   K 3.9 04/14/2021   CL 104 04/14/2021    CREATININE 1.21 (H) 04/14/2021   BUN 17 04/14/2021   CO2 23 04/14/2021   TSH 1.21 11/10/2019   INR 1.3 (H) 01/23/2019   HGBA1C 8.7 (H) 04/14/2021   MICROALBUR 34.1 (H) 04/14/2021    US Pelvis Complete  Result Date: 04/13/2021 CLINICAL DATA:  45 year old female with menorrhagia. EXAM: TRANSABDOMINAL ULTRASOUND OF PELVIS TECHNIQUE: Transabdominal ultrasound examination of the pelvis was performed including evaluation of the uterus, ovaries, adnexal regions, and pelvic cul-de-sac. COMPARISON:  11/25/2020 ultrasound  FINDINGS: Uterus Measurements: 10.6 x 6.1 x 6.4 cm = volume: 217 mL. At least 3 fibroids are noted as follows: A 5.4 x 4.5 x 5 cm intramural RIGHT fundal fibroid A 1.9 x 1.9 x 1.3 cm exophytic subserosal RIGHT fundal fibroid A 5.1 x 3.7 x 4.7 cm intramural LEFT fundal fibroid. Endometrium Thickness: 4 mm.  No focal abnormality visualized. Right ovary Not visualized Left ovary Not visualized Other findings:  No free fluid or adnexal mass identified. IMPRESSION: 1. Three uterine fibroids again noted as described. 2. No acute abnormalities identified. No free fluid or adnexal masses. 3. Bilateral ovaries not visualized. Electronically Signed   By: Margarette Canada M.D.   On: 04/13/2021 14:04    Assessment & Plan:   Maureen Price was seen today for annual exam, anemia, coronary artery disease, diabetes and hyperlipidemia.  Diagnoses and all orders for this visit:  Microalbuminuria due to type 2 diabetes mellitus ([place])- Will try to get better control of her blood sugar. -     Microalbumin / creatinine urine ratio; Future -     Microalbumin / creatinine urine ratio  Deficiency anemia- Her H&H is normal now.  I will screen for vitamin deficiencies. -     CBC with Differential/Platelet; Future -     Iron; Future -     Vitamin B12; Future -     Vitamin B1; Future -     Folate; Future -     Folate -     Vitamin B1 -     Vitamin B12 -     Iron -     CBC with Differential/Platelet  Dyslipidemia,  goal LDL below 70- She has not achieved her LDL goal.  I recommended better compliance with the statin and will add bempedoic acid and ezetimibe. -     Lipid panel; Future -     Lipid panel -     atorvastatin (LIPITOR) 20 MG tablet; Take 1 tablet (20 mg total) by mouth daily. -     Bempedoic Acid-Ezetimibe (NEXLIZET) 180-10 MG TABS; Take 1 tablet by mouth daily.  Iron deficiency anemia due to chronic blood loss- Her iron level is normal. -     CBC with Differential/Platelet; Future -     Iron; Future -     Ferritin; Future -     Ferritin -     Iron -     CBC with Differential/Platelet  Routine general medical examination at a health care facility- Exam completed, labs reviewed, vaccines reviewed, cancer screenings addressed, patient education was given.  Coronary artery disease, unspecified vessel or lesion type, unspecified whether angina present, unspecified whether native or transplanted heart- She had no s/s of angina. -     EKG 12-Lead -     Troponin I (High Sensitivity); Future -     Troponin I (High Sensitivity)  Uterine leiomyoma, unspecified location -     Ambulatory referral to Gynecology  Uncontrolled diabetes mellitus type 2 with atherosclerosis of arteries of extremities ([place])- Her A1c is up to 8.7%.  I recommended better compliance with the SGLT2-inh and the GLP-1 agonist and basal/bolus insulin. -     Basic metabolic panel; Future -     Hemoglobin A1c; Future -     Microalbumin / creatinine urine ratio; Future -     HM Diabetes Foot Exam -     Microalbumin / creatinine urine ratio -     Hemoglobin A1c -     Basic metabolic panel -  Continuous Blood Gluc Sensor (FREESTYLE LIBRE 2 SENSOR) MISC; 1 Act by Does not apply route daily. -     Continuous Blood Gluc Receiver (FREESTYLE LIBRE 2 READER) DEVI; 1 Act by Does not apply route daily. -     insulin glargine, 2 Unit Dial, (TOUJEO MAX SOLOSTAR) 300 UNIT/ML Solostar Pen; Inject 50 Units into the skin daily. -      Semaglutide (RYBELSUS) 3 MG TABS; Take 1 tablet by mouth daily. -     dapagliflozin propanediol (FARXIGA) 10 MG TABS tablet; Take 1 tablet (10 mg total) by mouth daily. -     insulin aspart (NOVOLOG PENFILL) cartridge; Inject 15 Units into the skin 3 (three) times daily with meals. -     Insulin Pen Needle (BD PEN NEEDLE NANO 2ND GEN) 32G X 4 MM MISC; Inject 1 Act into the skin 4 (four) times daily. Use to inject insulin once per day. DX E11.8  Coronary artery disease involving native coronary artery of native heart without angina pectoris- She has had no recent episodes of angina.  Her EKG is unchanged.  Will continue to address risk factor modifications. -     atorvastatin (LIPITOR) 20 MG tablet; Take 1 tablet (20 mg total) by mouth daily. -     aspirin EC 81 MG tablet; Take 1 tablet (81 mg total) by mouth daily. -     Bempedoic Acid-Ezetimibe (NEXLIZET) 180-10 MG TABS; Take 1 tablet by mouth daily.  Dietary folate deficiency anemia -     folic acid (FOLVITE) 1 MG tablet; Take 1 tablet (1 mg total) by mouth daily.  Atherosclerosis of native artery of both lower extremities with intermittent claudication ([place])- Will continue to address risk factor modifications. -     aspirin EC 81 MG tablet; Take 1 tablet (81 mg total) by mouth daily.  Other orders -     LDL cholesterol, direct  I have changed Maureen Price's BD Pen Needle Nano 2nd Gen. I am also having her start on FreeStyle Libre 2 Sensor, [employer] 2 Reader, folic acid, NovoLOG PenFill, and Nexlizet. Additionally, I am having her maintain her vitamin B-12, Multiple Vitamin (MULTI-VITAMIN DAILY PO), vitamin C, blood glucose meter kit and supplies, Trelegy Ellipta, nitroGLYCERIN, ALPRAZolam, metroNIDAZOLE, medroxyPROGESTERone, Oxycodone HCl, atorvastatin, Toujeo Max SoloStar, aspirin EC, Rybelsus, and dapagliflozin propanediol.  Meds ordered this encounter  Medications   atorvastatin (LIPITOR) 20 MG tablet    Sig: Take 1  tablet (20 mg total) by mouth daily.    Dispense:  90 tablet    Refill:  1   Continuous Blood Gluc Sensor (FREESTYLE LIBRE 2 SENSOR) MISC    Sig: 1 Act by Does not apply route daily.    Dispense:  2 each    Refill:  5   Continuous Blood Gluc Receiver (FREESTYLE LIBRE 2 READER) DEVI    Sig: 1 Act by Does not apply route daily.    Dispense:  2 each    Refill:  5   folic acid (FOLVITE) 1 MG tablet    Sig: Take 1 tablet (1 mg total) by mouth daily.    Dispense:  90 tablet    Refill:  1   insulin glargine, 2 Unit Dial, (TOUJEO MAX SOLOSTAR) 300 UNIT/ML Solostar Pen    Sig: Inject 50 Units into the skin daily.    Dispense:  9 mL    Refill:  0   aspirin EC 81 MG tablet    Sig: Take 1 tablet (81  mg total) by mouth daily.    Dispense:  90 tablet    Refill:  1   Semaglutide (RYBELSUS) 3 MG TABS    Sig: Take 1 tablet by mouth daily.    Dispense:  30 tablet    Refill:  0   dapagliflozin propanediol (FARXIGA) 10 MG TABS tablet    Sig: Take 1 tablet (10 mg total) by mouth daily.    Dispense:  90 tablet    Refill:  1   insulin aspart (NOVOLOG PENFILL) cartridge    Sig: Inject 15 Units into the skin 3 (three) times daily with meals.    Dispense:  42 mL    Refill:  1   Insulin Pen Needle (BD PEN NEEDLE NANO 2ND GEN) 32G X 4 MM MISC    Sig: Inject 1 Act into the skin 4 (four) times daily. Use to inject insulin once per day. DX E11.8    Dispense:  200 each    Refill:  2    May substitute with what is available.   Bempedoic Acid-Ezetimibe (NEXLIZET) 180-10 MG TABS    Sig: Take 1 tablet by mouth daily.    Dispense:  90 tablet    Refill:  1      Follow-up: Return in about 3 months (around 07/15/2021).  [name], MD

## 2021-04-15 ENCOUNTER — Encounter: Payer: Self-pay | Admitting: Internal Medicine

## 2021-04-15 ENCOUNTER — Other Ambulatory Visit: Payer: Self-pay | Admitting: Internal Medicine

## 2021-04-15 MED ORDER — NEXLIZET 180-10 MG PO TABS: 1.0000 | ORAL_TABLET | Freq: Every day | ORAL | 1 refills | Status: DC

## 2021-04-21 ENCOUNTER — Other Ambulatory Visit: Payer: Self-pay | Admitting: Internal Medicine

## 2021-04-21 DIAGNOSIS — E519 Thiamine deficiency, unspecified: Secondary | ICD-10-CM | POA: Insufficient documentation

## 2021-04-21 DIAGNOSIS — D538 Other specified nutritional anemias: Secondary | ICD-10-CM

## 2021-04-21 LAB — VITAMIN B1: Vitamin B1 (Thiamine): <6 nmol/L — ABNORMAL LOW (ref 8–30)

## 2021-04-21 MED ORDER — VITAMIN B-1 50 MG PO TABS: 50.0000 mg | ORAL_TABLET | Freq: Every day | ORAL | 1 refills | Status: DC

## 2021-04-22 ENCOUNTER — Encounter: Payer: Self-pay | Admitting: Internal Medicine

## 2021-04-23 ENCOUNTER — Other Ambulatory Visit: Payer: Self-pay | Admitting: Internal Medicine

## 2021-04-23 DIAGNOSIS — F411 Generalized anxiety disorder: Secondary | ICD-10-CM

## 2021-04-23 MED ORDER — ALPRAZOLAM 0.5 MG PO TABS: 0.5000 mg | ORAL_TABLET | Freq: Two times a day (BID) | ORAL | 3 refills | Status: DC | PRN

## 2021-05-01 ENCOUNTER — Telehealth: Payer: Self-pay | Admitting: Pharmacist

## 2021-05-01 DIAGNOSIS — I2510 Atherosclerotic heart disease of native coronary artery without angina pectoris: Secondary | ICD-10-CM

## 2021-05-01 DIAGNOSIS — E1151 Type 2 diabetes mellitus with diabetic peripheral angiopathy without gangrene: Secondary | ICD-10-CM

## 2021-05-01 DIAGNOSIS — IMO0002 Reserved for concepts with insufficient information to code with codable children: Secondary | ICD-10-CM

## 2021-05-01 DIAGNOSIS — E785 Hyperlipidemia, unspecified: Secondary | ICD-10-CM

## 2021-05-01 MED ORDER — RYBELSUS 3 MG PO TABS
1.0000 | ORAL_TABLET | Freq: Every day | ORAL | 0 refills | Status: DC
Start: 2021-05-01 — End: 2021-07-28

## 2021-05-01 MED ORDER — BASAGLAR KWIKPEN 100 UNIT/ML ~~LOC~~ SOPN: 50.0000 [IU] | PEN_INJECTOR | Freq: Every day | SUBCUTANEOUS | 3 refills | Status: DC

## 2021-05-01 MED ORDER — DAPAGLIFLOZIN PROPANEDIOL 10 MG PO TABS
10.0000 mg | ORAL_TABLET | Freq: Every day | ORAL | 1 refills | Status: DC
Start: 2021-05-01 — End: 2022-01-09

## 2021-05-01 MED ORDER — NEXLIZET 180-10 MG PO TABS
1.0000 | ORAL_TABLET | Freq: Every day | ORAL | 1 refills | Status: DC
Start: 2021-05-01 — End: 2022-01-09

## 2021-05-01 MED ORDER — HUMALOG KWIKPEN 200 UNIT/ML ~~LOC~~ SOPN: 15.0000 [IU] | PEN_INJECTOR | Freq: Three times a day (TID) | SUBCUTANEOUS | 3 refills | Status: DC

## 2021-05-01 NOTE — Telephone Encounter (Signed)
See phone note 05/01/21.

## 2021-05-01 NOTE — Telephone Encounter (Signed)
Contacted patient to discuss cost issues with insulin and medications.  Her insulin (Toujeo and Novolog) were over $500 together.  Pt is commercially insured so she is eligible for the [employer] program so she can get 30-day supplies for $35 each. We will switch her insulins to Basaglar and Humalog and pt will call [employer] to enroll in $35 program [phone]).  Pt is also eligible for savings cards for Farxiga, Rybelsus and Nexlizet. Obtained savings cards and e-scribed information to [employer].  Pt is appreciative of info and will call or message if she has any further issues.

## 2021-07-17 ENCOUNTER — Other Ambulatory Visit: Payer: Self-pay

## 2021-07-17 ENCOUNTER — Emergency Department (HOSPITAL_BASED_OUTPATIENT_CLINIC_OR_DEPARTMENT_OTHER): Payer: Commercial Managed Care - PPO

## 2021-07-17 ENCOUNTER — Encounter (HOSPITAL_BASED_OUTPATIENT_CLINIC_OR_DEPARTMENT_OTHER): Payer: Self-pay | Admitting: Emergency Medicine

## 2021-07-17 ENCOUNTER — Emergency Department (HOSPITAL_BASED_OUTPATIENT_CLINIC_OR_DEPARTMENT_OTHER)
Admission: EM | Admit: 2021-07-17 | Discharge: 2021-07-18 | Disposition: A | Discharge status: Home / Self Care | Payer: Commercial Managed Care - PPO | Attending: Emergency Medicine | Admitting: Emergency Medicine

## 2021-07-17 DIAGNOSIS — R1084 Generalized abdominal pain: Secondary | ICD-10-CM | POA: Diagnosis present

## 2021-07-17 DIAGNOSIS — Z955 Presence of coronary angioplasty implant and graft: Secondary | ICD-10-CM | POA: Insufficient documentation

## 2021-07-17 DIAGNOSIS — Z794 Long term (current) use of insulin: Secondary | ICD-10-CM | POA: Insufficient documentation

## 2021-07-17 DIAGNOSIS — E1129 Type 2 diabetes mellitus with other diabetic kidney complication: Secondary | ICD-10-CM | POA: Insufficient documentation

## 2021-07-17 DIAGNOSIS — J45909 Unspecified asthma, uncomplicated: Secondary | ICD-10-CM | POA: Insufficient documentation

## 2021-07-17 DIAGNOSIS — I5032 Chronic diastolic (congestive) heart failure: Secondary | ICD-10-CM | POA: Diagnosis not present

## 2021-07-17 DIAGNOSIS — I2510 Atherosclerotic heart disease of native coronary artery without angina pectoris: Secondary | ICD-10-CM | POA: Insufficient documentation

## 2021-07-17 DIAGNOSIS — K219 Gastro-esophageal reflux disease without esophagitis: Secondary | ICD-10-CM | POA: Diagnosis not present

## 2021-07-17 DIAGNOSIS — Z7982 Long term (current) use of aspirin: Secondary | ICD-10-CM | POA: Diagnosis not present

## 2021-07-17 DIAGNOSIS — Z7984 Long term (current) use of oral hypoglycemic drugs: Secondary | ICD-10-CM | POA: Insufficient documentation

## 2021-07-17 DIAGNOSIS — F17210 Nicotine dependence, cigarettes, uncomplicated: Secondary | ICD-10-CM | POA: Insufficient documentation

## 2021-07-17 DIAGNOSIS — E1165 Type 2 diabetes mellitus with hyperglycemia: Secondary | ICD-10-CM | POA: Insufficient documentation

## 2021-07-17 DIAGNOSIS — R739 Hyperglycemia, unspecified: Secondary | ICD-10-CM

## 2021-07-17 LAB — PREGNANCY, URINE: Preg Test, Ur: NEGATIVE

## 2021-07-17 LAB — CBC
HCT: 41 % (ref 36.0–46.0)
Hemoglobin: 14.1 g/dL (ref 12.0–15.0)
MCH: 27.4 pg (ref 26.0–34.0)
MCHC: 34.4 g/dL (ref 30.0–36.0)
MCV: 79.6 fL — ABNORMAL LOW (ref 80.0–100.0)
Platelets: 324 10*3/uL (ref 150–400)
RBC: 5.15 MIL/uL — ABNORMAL HIGH (ref 3.87–5.11)
RDW: 13.3 % (ref 11.5–15.5)
WBC: 12.3 10*3/uL — ABNORMAL HIGH (ref 4.0–10.5)
nRBC: 0 % (ref 0.0–0.2)

## 2021-07-17 LAB — URINALYSIS, ROUTINE W REFLEX MICROSCOPIC
Bilirubin Urine: NEGATIVE
Glucose, UA: >1000 mg/dL — AB
Hgb urine dipstick: NEGATIVE
Ketones, ur: NEGATIVE mg/dL
Leukocytes,Ua: NEGATIVE
Nitrite: NEGATIVE
Specific Gravity, Urine: 1.014 (ref 1.005–1.030)
pH: 5.5 (ref 5.0–8.0)

## 2021-07-17 LAB — COMPREHENSIVE METABOLIC PANEL
ALT: 19 U/L (ref 0–44)
AST: 14 U/L — ABNORMAL LOW (ref 15–41)
Albumin: 4 g/dL (ref 3.5–5.0)
Alkaline Phosphatase: 88 U/L (ref 38–126)
Anion gap: 12 (ref 5–15)
BUN: 14 mg/dL (ref 6–20)
CO2: 18 mmol/L — ABNORMAL LOW (ref 22–32)
Calcium: 9.2 mg/dL (ref 8.9–10.3)
Chloride: 103 mmol/L (ref 98–111)
Creatinine, Ser: 1.21 mg/dL — ABNORMAL HIGH (ref 0.44–1.00)
GFR, Estimated: 56 mL/min — ABNORMAL LOW (ref >60)
Glucose, Bld: 255 mg/dL — ABNORMAL HIGH (ref 70–99)
Potassium: 4 mmol/L (ref 3.5–5.1)
Sodium: 133 mmol/L — ABNORMAL LOW (ref 135–145)
Total Bilirubin: 0.3 mg/dL (ref 0.3–1.2)
Total Protein: 7.3 g/dL (ref 6.5–8.1)

## 2021-07-17 LAB — LIPASE, BLOOD: Lipase: 35 U/L (ref 11–51)

## 2021-07-17 MED ORDER — SODIUM CHLORIDE 0.9 % IV SOLN: INTRAVENOUS | Status: DC

## 2021-07-17 MED ORDER — IOHEXOL 300 MG/ML  SOLN
100.0000 mL | Freq: Once | INTRAMUSCULAR | Status: AC | PRN
  Administered 2021-07-17: 100 mL via INTRAVENOUS

## 2021-07-17 MED ORDER — SODIUM CHLORIDE 0.9 % IV BOLUS
500.0000 mL | Freq: Once | INTRAVENOUS | Status: AC
  Administered 2021-07-17: 500 mL via INTRAVENOUS

## 2021-07-17 NOTE — ED Triage Notes (Signed)
Pt via pov from home with bloating and abdominal pain. Pt states she has been feeling normal and today while at work she noticed her abdomen was unusually large and she was having abdominal pain as well as vaginal pressure. Pt denies pregnancy, state she has had 2 pregnancy tests and that she is not currently sexually active. Pt alert & oriented, nad noted.

## 2021-07-17 NOTE — Discharge Instructions (Signed)
CT scan of the abdomen without any acute findings.  Labs without any significant abnormalities other than your blood sugar was somewhat elevated.  Would recommend following up with your regular doctor.  Return for any new or worse symptoms.

## 2021-07-17 NOTE — ED Notes (Signed)
Pt urinated before coming back to room. Pt unable to urinate at this time. Pt stated that she needs something to drink.

## 2021-07-17 NOTE — ED Notes (Signed)
Need redraw on lavender and light green tubes.

## 2021-07-17 NOTE — ED Notes (Signed)
Pt discharged home after verbalizing understanding of discharge instructions; nad noted. 

## 2021-07-17 NOTE — ED Provider Notes (Signed)
[facility] EMERGENCY DEPT Provider Note   CSN: [id number] Arrival date & time: 07/17/21  1719     History Chief Complaint  Patient presents with   Abdominal Pain    Maureen Price is a 45 y.o. female.  Patient came in by POV.  With bloating and abdominal pain.  Abdominal pain nonlocalized but was more so in the lower quadrants.  Now resolved.  Patient states she was at work and her belly seem to get distended.  She denies any vaginal discharge.  Past medical history significant for congestive heart failure coronary artery disease and diabetes.  Patient denies any chest pain.  Actually overall feels better at the moment.      Past Medical History:  Diagnosis Date   Acute myocardial infarction of other anterior wall, initial episode of care    Anginal pain ([place])    Anxiety    Arthritis    Asthma    CAD (coronary artery disease)    CHF (congestive heart failure) ([place])    Depression    Diabetes mellitus    2002; no meds since 2004   Dyslipidemia, goal LDL below 70 10/26/2013   Dysrhythmia    GERD (gastroesophageal reflux disease)    Headache(784.0)    Obesity    Paroxysmal ventricular tachycardia    Shortness of breath    STEMI (ST elevation myocardial infarction) ([place])    Tobacco abuse     Patient Active Problem List   Diagnosis Date Noted   Anemia due to acquired thiamine deficiency 04/21/2021   Uterine leiomyoma 04/14/2021   Dietary folate deficiency anemia 04/14/2021   Routine general medical examination at a health care facility 11/10/2019   Deficiency anemia 11/10/2019   Cervical cancer screening 11/10/2019   Iron deficiency anemia due to chronic blood loss 11/10/2019   Chronic diastolic (congestive) heart failure ([place]) 01/23/2019   Anxiety 01/22/2019   Panic anxiety syndrome 01/05/2019   Long-term current use of opiate analgesic 02/28/2018   Microalbuminuria due to type 2 diabetes mellitus ([place]) 06/24/2017   Visit for screening  mammogram 06/23/2017   Asthma 09/18/2016   DDD (degenerative disc disease), lumbar 01/02/2015   Atherosclerosis of native artery of extremity with intermittent claudication ([place]) 03/05/2014   Tobacco use disorder 03/05/2014   Coronary artery disease 11/06/2013   Dyslipidemia, goal LDL below 70 10/26/2013   Obesity    Uncontrolled diabetes mellitus type 2 with atherosclerosis of arteries of extremities 07/07/2012   DJD (degenerative joint disease) of knee 07/07/2012    Past Surgical History:  Procedure Laterality Date   CESAREAN SECTION     CESAREAN SECTION  [phone]   CHOLECYSTECTOMY     CORONARY ANGIOPLASTY WITH STENT PLACEMENT     GALLBLADDER SURGERY     INCISION AND DRAINAGE PERIRECTAL ABSCESS N/A 01/23/2019   Procedure: IRRIGATION AND DEBRIDEMENT PERIRECTAL ABSCESS;  Surgeon: Rolm Bookbinder, MD;  Location: [facility];  Service: General;  Laterality: N/A;   LEFT HEART CATH N/A 10/24/2013   Procedure: LEFT HEART CATH;  Surgeon: Lorretta Harp, MD;  Location: [facility] CATH LAB;  Service: Cardiovascular;  Laterality: N/A;   LEFT HEART CATHETERIZATION WITH CORONARY ANGIOGRAM N/A 10/26/2013   Procedure: LEFT HEART CATHETERIZATION WITH CORONARY ANGIOGRAM;  Surgeon: Burnell Blanks, MD;  Location: [facility] CATH LAB;  Service: Cardiovascular;  Laterality: N/A;   LEFT HEART CATHETERIZATION WITH CORONARY ANGIOGRAM N/A 03/29/2014   Procedure: LEFT HEART CATHETERIZATION WITH CORONARY ANGIOGRAM;  Surgeon: Lorretta Harp, MD;  Location: [facility] CATH  LAB;  Service: Cardiovascular;  Laterality: N/A;   TUBAL LIGATION     2000     OB History     Gravida  4   Para  3   Term  3   Preterm      AB  1   Living  3      SAB  1   IAB      Ectopic      Multiple      Live Births              Family History  Problem Relation Age of Onset   Arthritis Mother    Diabetes Mother    Heart disease Father    Stroke Father    Diabetes Maternal Grandfather    Cancer Neg Hx    Alcohol  abuse Neg Hx    Early death Neg Hx    Kidney disease Neg Hx    Hypertension Neg Hx    Hyperlipidemia Neg Hx    Thyroid disease Neg Hx     Social History   Tobacco Use   Smoking status: Every Day    Packs/day: 0.50    Years: 20.00    Pack years: 10.00    Types: Cigarettes   Smokeless tobacco: Never   Tobacco comments:    8-10 cigs/day  Vaping Use   Vaping Use: Never used  Substance Use Topics   Alcohol use: Yes    Comment: occasional   Drug use: No    Home Medications Prior to Admission medications   Medication Sig Start Date End Date Taking? Authorizing Provider  ALPRAZolam Duanne Moron) 0.5 MG tablet Take 1 tablet (0.5 mg total) by mouth 2 (two) times daily as needed for anxiety. 04/23/21   [name], MD  aspirin EC 81 MG tablet Take 1 tablet (81 mg total) by mouth daily. 04/14/21   [name], MD  atorvastatin (LIPITOR) 20 MG tablet Take 1 tablet (20 mg total) by mouth daily. 04/14/21   [name], MD  Bempedoic Acid-Ezetimibe (NEXLIZET) 180-10 MG TABS Take 1 tablet by mouth daily. 05/01/21   [name], MD  blood glucose meter kit and supplies Dispense based on patient and insurance preference. Use up to four times daily as directed. (FOR ICD-10 E10.9, E11.9). 02/06/19   Patrecia Pour, MD  Continuous Blood Gluc Receiver (FREESTYLE LIBRE 2 READER) DEVI 1 Act by Does not apply route daily. 04/14/21   [name], MD  Continuous Blood Gluc Sensor (FREESTYLE LIBRE 2 SENSOR) MISC 1 Act by Does not apply route daily. 04/14/21   [name], MD  dapagliflozin propanediol (FARXIGA) 10 MG TABS tablet Take 1 tablet (10 mg total) by mouth daily. 05/01/21   [name], MD  Fluticasone-Umeclidin-Vilant (TRELEGY ELLIPTA) 100-62.5-25 MCG/INH AEPB Inhale 1 puff into the lungs daily. 07/16/20   [name], MD  folic acid (FOLVITE) 1 MG tablet Take 1 tablet (1 mg total) by mouth daily. 04/14/21   [name], MD  Insulin Glargine [facility] KWIKPEN) 100 UNIT/ML Inject 50  Units into the skin daily. 05/01/21   [name], MD  insulin lispro (HUMALOG KWIKPEN) 200 UNIT/ML KwikPen Inject 15 Units into the skin 3 (three) times daily with meals. 05/01/21   [name], MD  Insulin Pen Needle (BD PEN NEEDLE NANO 2ND GEN) 32G X 4 MM MISC Inject 1 Act into the skin 4 (four) times daily. Use to inject insulin  once per day. DX E11.8 04/14/21   [name], MD  medroxyPROGESTERone (PROVERA) 10 MG tablet Take 2 tablets (20 mg total) by mouth daily for 10 days. Patient not taking: Reported on 04/14/2021 04/13/21 04/23/21  Regan Lemming, MD  metroNIDAZOLE (FLAGYL) 500 MG tablet Take 1 tablet (500 mg total) by mouth 2 (two) times daily. Patient not taking: Reported on 04/14/2021 02/05/21   Chase Picket, MD  Multiple Vitamin (MULTI-VITAMIN DAILY PO) Take 1 tablet by mouth daily.    [provider]  nitroGLYCERIN (NITROSTAT) 0.4 MG SL tablet Place 1 tablet (0.4 mg total) under the tongue every 5 (five) minutes as needed for chest pain (Hold for SBP <100). 08/14/20   Lorretta Harp, MD  Oxycodone HCl 10 MG TABS Take 10 mg by mouth every 8 (eight) hours as needed. 03/01/21   [provider]  Semaglutide (RYBELSUS) 3 MG TABS Take 1 tablet by mouth daily. 05/01/21   [name], MD  thiamine (VITAMIN B-1) 50 MG tablet Take 1 tablet (50 mg total) by mouth daily. 04/21/21   [name], MD  vitamin B-12 (CYANOCOBALAMIN) 1000 MCG tablet Take 2,000 mcg by mouth daily.    [provider]  vitamin C (ASCORBIC ACID) 500 MG tablet Take 1,000 mg by mouth daily.    [provider]    Allergies    Erythromycin, Penicillins, Metformin and related, Viibryd [vilazodone hcl], and Bee venom  Review of Systems   Review of Systems  Constitutional:  Negative for chills and fever.  HENT:  Negative for ear pain and sore throat.   Eyes:  Negative for pain and visual disturbance.  Respiratory:  Negative for cough and shortness of breath.   Cardiovascular:   Negative for chest pain and palpitations.  Gastrointestinal:  Positive for abdominal distention and abdominal pain. Negative for vomiting.  Genitourinary:  Negative for dysuria and hematuria.  Musculoskeletal:  Negative for arthralgias and back pain.  Skin:  Negative for color change and rash.  Neurological:  Negative for seizures and syncope.  All other systems reviewed and are negative.  Physical Exam Updated Vital Signs BP (!) 153/70   Pulse 84   Temp 98.3 F (36.8 C) (Oral)   Resp 18   Ht 1.638 m (5' 4.5")   Wt 102.5 kg   LMP 07/01/2021 (Exact Date)   SpO2 100%   BMI 38.19 kg/m   Physical Exam Vitals and nursing note reviewed.  Constitutional:      General: She is not in acute distress.    Appearance: Normal appearance. She is well-developed. She is obese.  HENT:     Head: Normocephalic and atraumatic.  Eyes:     Conjunctiva/sclera: Conjunctivae normal.     Pupils: Pupils are equal, round, and reactive to light.  Cardiovascular:     Rate and Rhythm: Normal rate and regular rhythm.     Heart sounds: No murmur heard. Pulmonary:     Effort: Pulmonary effort is normal. No respiratory distress.     Breath sounds: Normal breath sounds.  Abdominal:     General: There is no distension.     Palpations: Abdomen is soft.     Tenderness: There is no abdominal tenderness. There is no guarding.  Musculoskeletal:        General: Normal range of motion.     Cervical back: Normal range of motion and neck supple.  Skin:    General: Skin is warm and dry.  Neurological:  General: No focal deficit present.     Mental Status: She is alert and oriented to person, place, and time.    ED Results / Procedures / Treatments   Labs (all labs ordered are listed, but only abnormal results are displayed) Labs Reviewed  COMPREHENSIVE METABOLIC PANEL - Abnormal; Notable for the following components:      Result Value   Sodium 133 (*)    CO2 18 (*)    Glucose, Bld 255 (*)     Creatinine, Ser 1.21 (*)    AST 14 (*)    GFR, Estimated 56 (*)    All other components within normal limits  URINALYSIS, ROUTINE W REFLEX MICROSCOPIC - Abnormal; Notable for the following components:   Color, Urine COLORLESS (*)    Glucose, UA >1,000 (*)    Protein, ur TRACE (*)    All other components within normal limits  CBC - Abnormal; Notable for the following components:   WBC 12.3 (*)    RBC 5.15 (*)    MCV 79.6 (*)    All other components within normal limits  LIPASE, BLOOD  PREGNANCY, URINE    EKG None  Radiology No results found.  Procedures Procedures   Medications Ordered in ED Medications  0.9 %  sodium chloride infusion (has no administration in time range)  sodium chloride 0.9 % bolus 500 mL (has no administration in time range)    ED Course  I have reviewed the triage vital signs and the nursing notes.  Pertinent labs & imaging results that were available during my care of the patient were reviewed by me and considered in my medical decision making (see chart for details).    MDM Rules/Calculators/A&P                           Patient is obese and abdominal exam is difficult.  We will going get CT scan.  Labs significant for elevated blood sugar, white blood cell count 12.3, lipase normal CO2 was 18 on electrolyte sodium was 133 but that is due to the blood sugar of 255.  GFR is 56 creatinine elevated at 1.21.  Liver function tests normal.  Anion gap normal.  CT scan of the abdomen without any acute findings.  Patient stable for discharge home.  Patient currently asymptomatic.  Patient will follow up with her doctor regarding high blood sugar.   Final Clinical Impression(s) / ED Diagnoses Final diagnoses:  Generalized abdominal pain  Hyperglycemia    Rx / DC Orders ED Discharge Orders     None        Fredia Sorrow, MD 07/17/21 [phone]

## 2021-07-26 ENCOUNTER — Encounter: Payer: Self-pay | Admitting: Internal Medicine

## 2021-07-28 ENCOUNTER — Other Ambulatory Visit: Payer: Self-pay | Admitting: Internal Medicine

## 2021-07-28 DIAGNOSIS — E139 Other specified diabetes mellitus without complications: Secondary | ICD-10-CM | POA: Insufficient documentation

## 2021-07-28 MED ORDER — TIRZEPATIDE 2.5 MG/0.5ML ~~LOC~~ SOAJ: 2.5000 mg | SUBCUTANEOUS | 0 refills | Status: DC

## 2021-07-31 ENCOUNTER — Emergency Department (HOSPITAL_BASED_OUTPATIENT_CLINIC_OR_DEPARTMENT_OTHER)
Admission: EM | Admit: 2021-07-31 | Discharge: 2021-07-31 | Disposition: A | Discharge status: Home / Self Care | Payer: Commercial Managed Care - PPO | Attending: Emergency Medicine | Admitting: Emergency Medicine

## 2021-07-31 ENCOUNTER — Encounter (HOSPITAL_BASED_OUTPATIENT_CLINIC_OR_DEPARTMENT_OTHER): Payer: Self-pay

## 2021-07-31 DIAGNOSIS — R220 Localized swelling, mass and lump, head: Secondary | ICD-10-CM | POA: Diagnosis present

## 2021-07-31 DIAGNOSIS — F17210 Nicotine dependence, cigarettes, uncomplicated: Secondary | ICD-10-CM | POA: Diagnosis not present

## 2021-07-31 DIAGNOSIS — E1129 Type 2 diabetes mellitus with other diabetic kidney complication: Secondary | ICD-10-CM | POA: Insufficient documentation

## 2021-07-31 DIAGNOSIS — J45909 Unspecified asthma, uncomplicated: Secondary | ICD-10-CM | POA: Insufficient documentation

## 2021-07-31 DIAGNOSIS — I2510 Atherosclerotic heart disease of native coronary artery without angina pectoris: Secondary | ICD-10-CM | POA: Insufficient documentation

## 2021-07-31 DIAGNOSIS — Z7982 Long term (current) use of aspirin: Secondary | ICD-10-CM | POA: Diagnosis not present

## 2021-07-31 DIAGNOSIS — I5032 Chronic diastolic (congestive) heart failure: Secondary | ICD-10-CM | POA: Insufficient documentation

## 2021-07-31 DIAGNOSIS — Z7951 Long term (current) use of inhaled steroids: Secondary | ICD-10-CM | POA: Diagnosis not present

## 2021-07-31 DIAGNOSIS — Z794 Long term (current) use of insulin: Secondary | ICD-10-CM | POA: Insufficient documentation

## 2021-07-31 DIAGNOSIS — T783XXA Angioneurotic edema, initial encounter: Secondary | ICD-10-CM | POA: Insufficient documentation

## 2021-07-31 DIAGNOSIS — Z7984 Long term (current) use of oral hypoglycemic drugs: Secondary | ICD-10-CM | POA: Diagnosis not present

## 2021-07-31 DIAGNOSIS — Z955 Presence of coronary angioplasty implant and graft: Secondary | ICD-10-CM | POA: Diagnosis not present

## 2021-07-31 MED ORDER — DIPHENHYDRAMINE HCL 25 MG PO CAPS
50.0000 mg | ORAL_CAPSULE | Freq: Once | ORAL | Status: AC
  Administered 2021-07-31: 50 mg via ORAL
  Filled 2021-07-31: qty 2

## 2021-07-31 MED ORDER — PREDNISONE 50 MG PO TABS
60.0000 mg | ORAL_TABLET | Freq: Once | ORAL | Status: AC
  Administered 2021-07-31: 60 mg via ORAL
  Filled 2021-07-31: qty 1

## 2021-07-31 MED ORDER — PREDNISONE 50 MG PO TABS: 50.0000 mg | ORAL_TABLET | Freq: Every day | ORAL | 0 refills | Status: DC

## 2021-07-31 NOTE — ED Triage Notes (Signed)
Patient reports having swollen lips and slightly swollen. Patient states she ate at a work dinner yesterday but has also started a new medication and is unsure what is causing the swelling.   Lips are swollen and red in triage.  Patient denies any problems swallowing, breathing or speaking.

## 2021-07-31 NOTE — ED Provider Notes (Signed)
[facility] EMERGENCY DEPT Provider Note   CSN: [id number] Arrival date & time: 07/31/21  0541     History Chief Complaint  Patient presents with   Oral Swelling    Maureen Price is a 45 y.o. female.  The history is provided by the patient.  Allergic Reaction Presenting symptoms: swelling   Presenting symptoms: no difficulty breathing, no difficulty swallowing, no itching, no rash and no wheezing   Severity:  Mild Prior allergic episodes:  No prior episodes Relieved by:  None tried Worsened by:  Nothing Patient presents for lip and tongue swelling.  Patient reports she woke up but she felt her lips were more full and the tip of her tongue was swollen No fever/vomiting or diarrhea.  No dental pain.  No vomiting or diarrhea.  She does not recall having this previously.  She just recently started taking mounjaro but no other new medications.  She did report eating homemade good yesterday and this may have caused her issues    Past Medical History:  Diagnosis Date   Acute myocardial infarction of other anterior wall, initial episode of care    Anginal pain ([place])    Anxiety    Arthritis    Asthma    CAD (coronary artery disease)    CHF (congestive heart failure) ([place])    Depression    Diabetes mellitus    2002; no meds since 2004   Dyslipidemia, goal LDL below 70 10/26/2013   Dysrhythmia    GERD (gastroesophageal reflux disease)    Headache(784.0)    Obesity    Paroxysmal ventricular tachycardia    Shortness of breath    STEMI (ST elevation myocardial infarction) ([place])    Tobacco abuse     Patient Active Problem List   Diagnosis Date Noted   Diabetes 1.5, managed as type 2 ([place]) 07/28/2021   Anemia due to acquired thiamine deficiency 04/21/2021   Uterine leiomyoma 04/14/2021   Dietary folate deficiency anemia 04/14/2021   Routine general medical examination at a health care facility 11/10/2019   Deficiency anemia 11/10/2019   Cervical cancer  screening 11/10/2019   Iron deficiency anemia due to chronic blood loss 11/10/2019   Chronic diastolic (congestive) heart failure ([place]) 01/23/2019   Anxiety 01/22/2019   Panic anxiety syndrome 01/05/2019   Long-term current use of opiate analgesic 02/28/2018   Microalbuminuria due to type 2 diabetes mellitus ([place]) 06/24/2017   Visit for screening mammogram 06/23/2017   Asthma 09/18/2016   DDD (degenerative disc disease), lumbar 01/02/2015   Atherosclerosis of native artery of extremity with intermittent claudication ([place]) 03/05/2014   Tobacco use disorder 03/05/2014   Coronary artery disease 11/06/2013   Dyslipidemia, goal LDL below 70 10/26/2013   Obesity    Uncontrolled diabetes mellitus type 2 with atherosclerosis of arteries of extremities 07/07/2012   DJD (degenerative joint disease) of knee 07/07/2012    Past Surgical History:  Procedure Laterality Date   CESAREAN SECTION     CESAREAN SECTION  [phone]   CHOLECYSTECTOMY     CORONARY ANGIOPLASTY WITH STENT PLACEMENT     GALLBLADDER SURGERY     INCISION AND DRAINAGE PERIRECTAL ABSCESS N/A 01/23/2019   Procedure: IRRIGATION AND DEBRIDEMENT PERIRECTAL ABSCESS;  Surgeon: Rolm Bookbinder, MD;  Location: [facility];  Service: General;  Laterality: N/A;   LEFT HEART CATH N/A 10/24/2013   Procedure: LEFT HEART CATH;  Surgeon: Lorretta Harp, MD;  Location: [facility] CATH LAB;  Service: Cardiovascular;  Laterality: N/A;   LEFT  HEART CATHETERIZATION WITH CORONARY ANGIOGRAM N/A 10/26/2013   Procedure: LEFT HEART CATHETERIZATION WITH CORONARY ANGIOGRAM;  Surgeon: Burnell Blanks, MD;  Location: [facility] CATH LAB;  Service: Cardiovascular;  Laterality: N/A;   LEFT HEART CATHETERIZATION WITH CORONARY ANGIOGRAM N/A 03/29/2014   Procedure: LEFT HEART CATHETERIZATION WITH CORONARY ANGIOGRAM;  Surgeon: Lorretta Harp, MD;  Location[facility] C[facility] LAB;  Service: Cardiovascular;  Laterality: N/A;   TUBAL LIGATION     2000     OB History     Gravida   4   Para  3   Term  3   Preterm      AB  1   Living  3      SAB  1   IAB      Ectopic      Multiple      Live Births              Family History  Problem Relation Age of Onset   Arthritis Mother    Diabetes Mother    Heart disease Father    Stroke Father    Diabetes Maternal Grandfather    Cancer Neg Hx    Alcohol abuse Neg Hx    Early death Neg Hx    Kidney disease Neg Hx    Hypertension Neg Hx    Hyperlipidemia Neg Hx    Thyroid disease Neg Hx     Social History   Tobacco Use   Smoking status: Every Day    Packs/day: 0.50    Years: 20.00    Pack years: 10.00    Types: Cigarettes   Smokeless tobacco: Never   Tobacco comments:    8-10 cigs/day  Vaping Use   Vaping Use: Never used  Substance Use Topics   Alcohol use: Yes    Comment: occasional   Drug use: No    Home Medications Prior to Admission medications   Medication Sig Start Date End Date Taking? Authorizing Provider  predniSONE (DELTASONE) 50 MG tablet Take 1 tablet (50 mg total) by mouth daily with breakfast. 07/31/21  Yes Ripley Fraise, MD  ALPRAZolam Duanne Moron) 0.5 MG tablet Take 1 tablet (0.5 mg total) by mouth 2 (two) times daily as needed for anxiety. 04/23/21   [name], MD  aspirin EC 81 MG tablet Take 1 tablet (81 mg total) by mouth daily. 04/14/21   [name], MD  atorvastatin (LIPITOR) 20 MG tablet Take 1 tablet (20 mg total) by mouth daily. 04/14/21   [name], MD  Bempedoic Acid-Ezetimibe (NEXLIZET) 180-10 MG TABS Take 1 tablet by mouth daily. 05/01/21   [name], MD  blood glucose meter kit and supplies Dispense based on patient and insurance preference. Use up to four times daily as directed. (FOR ICD-10 E10.9, E11.9). 02/06/19   Patrecia Pour, MD  Continuous Blood Gluc Receiver (FREESTYLE LIBRE 2 READER) DEVI 1 Act by Does not apply route daily. 04/14/21   [name], MD  Continuous Blood Gluc Sensor (FREESTYLE LIBRE 2 SENSOR) MISC 1 Act by Does not  apply route daily. 04/14/21   [name], MD  dapagliflozin propanediol (FARXIGA) 10 MG TABS tablet Take 1 tablet (10 mg total) by mouth daily. 05/01/21   [name], MD  Fluticasone-Umeclidin-Vilant (TRELEGY ELLIPTA) 100-62.5-25 MCG/INH AEPB Inhale 1 puff into the lungs daily. 07/16/20   [name], MD  folic acid (FOLVITE) 1 MG tablet Take 1 tablet (1 mg total) by mouth  daily. 04/14/21   [name], MD  Insulin Glargine [facility]) 100 UNIT/ML Inject 50 Units into the skin daily. 05/01/21   [name], MD  insulin lispro (HUMALOG KWIKPEN) 200 UNIT/ML KwikPen Inject 15 Units into the skin 3 (three) times daily with meals. 05/01/21   [name], MD  Insulin Pen Needle (BD PEN NEEDLE NANO 2ND GEN) 32G X 4 MM MISC Inject 1 Act into the skin 4 (four) times daily. Use to inject insulin once per day. DX E11.8 04/14/21   [name], MD  Multiple Vitamin (MULTI-VITAMIN DAILY PO) Take 1 tablet by mouth daily.    [provider]  nitroGLYCERIN (NITROSTAT) 0.4 MG SL tablet Place 1 tablet (0.4 mg total) under the tongue every 5 (five) minutes as needed for chest pain (Hold for SBP <100). 08/14/20   Lorretta Harp, MD  Oxycodone HCl 10 MG TABS Take 10 mg by mouth every 8 (eight) hours as needed. 03/01/21   [provider]  thiamine (VITAMIN B-1) 50 MG tablet Take 1 tablet (50 mg total) by mouth daily. 04/21/21   [name], MD  tirzepatide [facility]) 2.5 MG/0.5ML Pen Inject 2.5 mg into the skin once a week. 07/28/21   [name], MD  vitamin B-12 (CYANOCOBALAMIN) 1000 MCG tablet Take 2,000 mcg by mouth daily.    [provider]  vitamin C (ASCORBIC ACID) 500 MG tablet Take 1,000 mg by mouth daily.    [provider]    Allergies    Erythromycin, Penicillins, Metformin and related, Viibryd [vilazodone hcl], and Bee venom  Review of Systems   Review of Systems  Constitutional:  Negative for fever.  HENT:  Positive for facial  swelling. Negative for dental problem and trouble swallowing.   Respiratory:  Negative for shortness of breath and wheezing.   Gastrointestinal:  Negative for diarrhea and vomiting.  Skin:  Negative for itching and rash.  All other systems reviewed and are negative.  Physical Exam Updated Vital Signs BP (!) 165/85 (BP Location: Right Arm)   Pulse 98   Temp 98.1 F (36.7 C) (Oral)   Ht 1.626 m (5' 4" )   Wt 102.5 kg   LMP 07/01/2021 (Exact Date)   SpO2 98%   BMI 38.79 kg/m   Physical Exam CONSTITUTIONAL: Well developed/well nourished HEAD: Normocephalic/atraumatic EYES: EOMI/PERRL ENMT: Mucous membranes moist Mild swelling noted to her lips.  No erythema.  No rash.  Poor dentition, but no gingival swelling or abscess.  No trismus.  No drooling.  No stridor.  No angioedema of the tongue.  Uvula is midline without edema or exudate NECK: supple no meningeal signs CV: S1/S2 noted, no murmurs/rubs/gallops noted LUNGS: Lungs are clear to auscultation bilaterally, no apparent distress ABDOMEN: soft, nontender NEURO: Pt is awake/alert/appropriate, moves all extremitiesx4.  No facial droop.   EXTREMITIES: pulses normal/equal, full ROM SKIN: warm, color normal, no rash or urticaria PSYCH: no abnormalities of mood noted, alert and oriented to situation  ED Results / Procedures / Treatments   Labs (all labs ordered are listed, but only abnormal results are displayed) Labs Reviewed - No data to display  EKG None  Radiology No results found.  Procedures Procedures   Medications Ordered in ED Medications  diphenhydrAMINE (BENADRYL) capsule 50 mg (50 mg Oral Given 07/31/21 0619)  predniSONE (DELTASONE) tablet 60 mg (60 mg Oral Given 07/31/21 8315)    ED Course  I have reviewed the triage vital signs and the  nursing notes.      MDM Rules/Calculators/A&P                           Patient presents with very mild edema of her lips.  No signs of significant allergic reaction.   Could be related to medication or food.  She does not take an ACE inhibitor Benadryl and prednisone have been given, will monitor 6:53 AM Patient reports feeling improved.  There is not any worsening swelling.  She is requesting discharge home. Given that her symptoms are very mild, no obvious tongue or pharyngeal involvement, she can be safely discharged home.  She will discuss with her doctor about changing medications.  We will give short course of prednisone, and advised her to monitor her blood glucose at home. I advised her that if there is any worsening tongue or lip swelling in the next 1-2 days she needs to call 911 and return to the ER. At this point, would not give EpiPen due to very mild symptoms Final Clinical Impression(s) / ED Diagnoses Final diagnoses:  Angioedema, initial encounter    Rx / DC Orders ED Discharge Orders          Ordered    predniSONE (DELTASONE) 50 MG tablet  Daily with breakfast        07/31/21 0653             Ripley Fraise, MD 07/31/21 [phone]

## 2021-08-12 ENCOUNTER — Other Ambulatory Visit: Payer: Self-pay | Admitting: Internal Medicine

## 2021-08-12 DIAGNOSIS — E139 Other specified diabetes mellitus without complications: Secondary | ICD-10-CM

## 2021-08-12 MED ORDER — TIRZEPATIDE 5 MG/0.5ML ~~LOC~~ SOAJ: 5.0000 mg | SUBCUTANEOUS | 0 refills | Status: DC

## 2021-08-19 ENCOUNTER — Telehealth: Payer: Self-pay

## 2021-08-19 NOTE — Telephone Encounter (Signed)
Key: [id number]

## 2021-08-20 NOTE — Telephone Encounter (Signed)
Per CoverMyMeds:  PA was senied

## 2021-08-22 ENCOUNTER — Other Ambulatory Visit: Payer: Self-pay | Admitting: Internal Medicine

## 2021-08-22 DIAGNOSIS — E139 Other specified diabetes mellitus without complications: Secondary | ICD-10-CM

## 2021-08-22 MED ORDER — TIRZEPATIDE 5 MG/0.5ML ~~LOC~~ SOAJ
5.0000 mg | SUBCUTANEOUS | 0 refills | Status: DC
Start: 2021-08-22 — End: 2022-01-09

## 2021-08-23 ENCOUNTER — Other Ambulatory Visit: Payer: Self-pay

## 2021-08-23 ENCOUNTER — Ambulatory Visit (INDEPENDENT_AMBULATORY_CARE_PROVIDER_SITE_OTHER): Payer: Commercial Managed Care - PPO

## 2021-08-23 ENCOUNTER — Ambulatory Visit (HOSPITAL_COMMUNITY)
Admission: EM | Admit: 2021-08-23 | Discharge: 2021-08-23 | Disposition: A | Discharge status: Home / Self Care | Payer: Commercial Managed Care - PPO | Attending: Family Medicine | Admitting: Family Medicine

## 2021-08-23 ENCOUNTER — Encounter (HOSPITAL_COMMUNITY): Payer: Self-pay | Admitting: *Deleted

## 2021-08-23 DIAGNOSIS — M79672 Pain in left foot: Secondary | ICD-10-CM | POA: Diagnosis not present

## 2021-08-23 DIAGNOSIS — M722 Plantar fascial fibromatosis: Secondary | ICD-10-CM | POA: Diagnosis not present

## 2021-08-23 MED ORDER — DICLOFENAC SODIUM 75 MG PO TBEC: 75.0000 mg | DELAYED_RELEASE_TABLET | Freq: Two times a day (BID) | ORAL | 0 refills | Status: DC

## 2021-08-23 NOTE — ED Triage Notes (Signed)
PT reports injury at work on 08-14-21 during a Code . Pt has pain in her Lt foot ,Lt leg and Lt knee. Lt knee is also swelling.

## 2021-08-23 NOTE — ED Provider Notes (Signed)
[place]   [id number] 08/23/21 Arrival Time: [place] PLAN:  1. Left foot pain    I have personally viewed the imaging studies ordered this visit. Normal foot. No fx. Discussed.  Begin: New Prescriptions   DICLOFENAC (VOLTAREN) 75 MG EC TABLET    Take 1 tablet (75 mg total) by mouth 2 (two) times daily.    Orders Placed This Encounter  Procedures   DG Foot Complete Left   Apply CAM boot   Crutches  Work note provided.  Recommend:  Follow-up Information     Schedule an appointment as soon as possible for a visit  with [facility]).   Why: If worsening or failing to improve as anticipated. Contact information: [address] [place],  [place]  [address]  [phone]               Reviewed expectations re: course of current medical issues. Questions answered. Outlined signs and symptoms indicating need for more acute intervention. Patient verbalized understanding. After Visit Summary given.  SUBJECTIVE: History from: patient. Maureen Price is a 45 y.o. female who reports foot pain; L planter; abrupt onset; 2 d ago; "felt a pull when I ran to something"; painful since. No extremity sensation changes or weakness. No specific aggravating or alleviating factors reported. No tx PTA.   Past Surgical History:  Procedure Laterality Date   CESAREAN SECTION     CESAREAN SECTION  [phone]   CHOLECYSTECTOMY     CORONARY ANGIOPLASTY WITH STENT PLACEMENT     GALLBLADDER SURGERY     INCISION AND DRAINAGE PERIRECTAL ABSCESS N/A 01/23/2019   Procedure: IRRIGATION AND DEBRIDEMENT PERIRECTAL ABSCESS;  Surgeon: Rolm Bookbinder, MD;  Location: [place];  Service: General;  Laterality: N/A;   LEFT HEART CATH N/A 10/24/2013   Procedure: LEFT HEART CATH;  Surgeon: Lorretta Harp, MD;  Location: [facility] CATH LAB;  Service: Cardiovascular;  Laterality: N/A;   LEFT HEART CATHETERIZATION WITH CORONARY ANGIOGRAM N/A  10/26/2013   Procedure: LEFT HEART CATHETERIZATION WITH CORONARY ANGIOGRAM;  Surgeon: Burnell Blanks, MD;  Location: [facility] CATH LAB;  Service: Cardiovascular;  Laterality: N/A;   LEFT HEART CATHETERIZATION WITH CORONARY ANGIOGRAM N/A 03/29/2014   Procedure: LEFT HEART CATHETERIZATION WITH CORONARY ANGIOGRAM;  Surgeon: Lorretta Harp, MD;  Location: [facility] CATH LAB;  Service: Cardiovascular;  Laterality: N/A;   TUBAL LIGATION     2000      OBJECTIVE:  Vitals:   08/23/21 1037  BP: (!) 141/82  Pulse: 87  Resp: 16  Temp: 98.4 F (36.9 C)  SpO2: 100%    General appearance: alert; no distress HEENT: ; AT Neck: supple with FROM Resp: unlabored respirations Extremities: LLE: warm with well perfused appearance; fairly well localized moderate tenderness over left plantar foot; without gross deformities; swelling: none; bruising: none; ankle and all toes with FROM CV: brisk extremity capillary refill of LLE; 2+ DP pulse of LLE. Skin: warm and dry; no visible rashes Neurologic: gait normal but favors LLE; normal sensation and strength of LLE Psychological: alert and cooperative; normal mood and affect  Imaging: DG Foot Complete Left  Result Date: 08/23/2021 CLINICAL DATA:  Foot pain, injury EXAM: LEFT FOOT - COMPLETE 3+ VIEW COMPARISON:  None. FINDINGS: No acute fracture or dislocation identified. Joint spaces are preserved. Posterior and plantar calcaneal spurs. No focal soft tissue swelling visualized. IMPRESSION: No acute osseous abnormality identified. Electronically Signed   By: Tiburcio Pea.D.  On: 08/23/2021 11:30      Allergies  Allergen Reactions   Erythromycin Anaphylaxis   Penicillins Anaphylaxis    Tolerates meropenem   Metformin And Related Other (See Comments)    Diarrhea, upset stomach   Viibryd [Vilazodone Hcl] Other (See Comments)    Altered mental status.     Bee Venom Swelling    Throat swells    Past Medical History:  Diagnosis Date   Acute  myocardial infarction of other anterior wall, initial episode of care    Anginal pain ([place])    Anxiety    Arthritis    Asthma    CAD (coronary artery disease)    CHF (congestive heart failure) ([place])    Depression    Diabetes mellitus    2002; no meds since 2004   Dyslipidemia, goal LDL below 70 10/26/2013   Dysrhythmia    GERD (gastroesophageal reflux disease)    Headache(784.0)    Obesity    Paroxysmal ventricular tachycardia    Shortness of breath    STEMI (ST elevation myocardial infarction) ([place])    Tobacco abuse    Social History   Socioeconomic History   Marital status: Divorced    Spouse name: Not on file   Number of children: Not on file   Years of education: Not on file   Highest education level: Not on file  Occupational History   Not on file  Tobacco Use   Smoking status: Every Day    Packs/day: 0.50    Years: 20.00    Pack years: 10.00    Types: Cigarettes   Smokeless tobacco: Never   Tobacco comments:    8-10 cigs/day  Vaping Use   Vaping Use: Never used  Substance and Sexual Activity   Alcohol use: Yes    Comment: occasional   Drug use: No   Sexual activity: Not Currently    Partners: Male    Birth control/protection: Surgical  Other Topics Concern   Not on file  Social History Narrative   Caffienated drinks- yes   Seat belt use often- yes   Regular Exercise- yes   Smoke alarm in the home- yes   Firearms/guns in the home- no   History of physical abuse- no            Social Determinants of Radio broadcast assistant Strain: Not on file  Food Insecurity: Not on file  Transportation Needs: Not on file  Physical Activity: Not on file  Stress: Not on file  Social Connections: Not on file   Family History  Problem Relation Age of Onset   Arthritis Mother    Diabetes Mother    Heart disease Father    Stroke Father    Diabetes Maternal Grandfather    Cancer Neg Hx    Alcohol abuse Neg Hx    Early death Neg Hx    Kidney disease Neg  Hx    Hypertension Neg Hx    Hyperlipidemia Neg Hx    Thyroid disease Neg Hx    Past Surgical History:  Procedure Laterality Date   CESAREAN SECTION     CESAREAN SECTION  [phone]   CHOLECYSTECTOMY     CORONARY ANGIOPLASTY WITH STENT PLACEMENT     GALLBLADDER SURGERY     INCISION AND DRAINAGE PERIRECTAL ABSCESS N/A 01/23/2019   Procedure: IRRIGATION AND DEBRIDEMENT PERIRECTAL ABSCESS;  Surgeon: Rolm Bookbinder, MD;  Location: [facility];  Service: General;  Laterality: N/A;   LEFT HEART CATH  N/A 10/24/2013   Procedure: LEFT HEART CATH;  Surgeon: Lorretta Harp, MD;  [facility] CATH LAB;  Service: Cardiovascular;  Laterality: N/A;   LEFT HEART CATHETERIZATION WITH CORONARY ANGIOGRAM N/A 10/26/2013   Procedure: LEFT HEART CATHETERIZATION WITH CORONARY ANGIOGRAM;  Surgeon: Burnell Blanks, MD;  Location: [facility] CATH LAB;  Service: Cardiovascular;  Laterality: N/A;   LEFT HEART CATHETERIZATION WITH CORONARY ANGIOGRAM N/A 03/29/2014   Procedure: LEFT HEART CATHETERIZATION WITH CORONARY ANGIOGRAM;  Surgeon: Lorretta Harp, MD;  Location: [facility] CATH LAB;  Service: Cardiovascular;  Laterality: N/A;   TUBAL LIGATION     2000       Vanessa Kick, MD 08/23/21 1144

## 2021-08-25 ENCOUNTER — Other Ambulatory Visit: Payer: Self-pay

## 2021-08-25 ENCOUNTER — Ambulatory Visit (INDEPENDENT_AMBULATORY_CARE_PROVIDER_SITE_OTHER): Payer: Commercial Managed Care - PPO | Admitting: Internal Medicine

## 2021-08-25 ENCOUNTER — Encounter: Payer: Self-pay | Admitting: Internal Medicine

## 2021-08-25 VITALS — BP 136/82 | HR 98 | Temp 98.2°F | Ht 64.0 in | Wt 219.0 lb

## 2021-08-25 DIAGNOSIS — Z124 Encounter for screening for malignant neoplasm of cervix: Secondary | ICD-10-CM

## 2021-08-25 DIAGNOSIS — R809 Proteinuria, unspecified: Secondary | ICD-10-CM

## 2021-08-25 DIAGNOSIS — E139 Other specified diabetes mellitus without complications: Secondary | ICD-10-CM

## 2021-08-25 DIAGNOSIS — N1831 Chronic kidney disease, stage 3a: Secondary | ICD-10-CM | POA: Diagnosis not present

## 2021-08-25 DIAGNOSIS — Z23 Encounter for immunization: Secondary | ICD-10-CM

## 2021-08-25 DIAGNOSIS — E1129 Type 2 diabetes mellitus with other diabetic kidney complication: Secondary | ICD-10-CM

## 2021-08-25 DIAGNOSIS — E1121 Type 2 diabetes mellitus with diabetic nephropathy: Secondary | ICD-10-CM

## 2021-08-25 LAB — BASIC METABOLIC PANEL
BUN: 15 mg/dL (ref 6–23)
CO2: 26 mEq/L (ref 19–32)
Calcium: 9.2 mg/dL (ref 8.4–10.5)
Chloride: 102 mEq/L (ref 96–112)
Creatinine, Ser: 1.37 mg/dL — ABNORMAL HIGH (ref 0.40–1.20)
GFR: 46.7 mL/min — ABNORMAL LOW (ref >60.00)
Glucose, Bld: 132 mg/dL — ABNORMAL HIGH (ref 70–99)
Potassium: 3.4 mEq/L — ABNORMAL LOW (ref 3.5–5.1)
Sodium: 137 mEq/L (ref 135–145)

## 2021-08-25 NOTE — Patient Instructions (Signed)
Type 2 Diabetes Mellitus, Diagnosis, Adult ?Type 2 diabetes (type 2 diabetes mellitus) is a long-term, or chronic, disease. In type 2 diabetes, one or both of these problems may be present: ?The pancreas does not make enough of a hormone called insulin. ?Cells in the body do not respond properly to the insulin that the body makes (insulin resistance). ?Normally, insulin allows blood sugar (glucose) to enter cells in the body. The cells use glucose for energy. Insulin resistance or lack of insulin causes excess glucose to build up in the blood instead of going into cells. This causes high blood glucose (hyperglycemia).  ?What are the causes? ?The exact cause of type 2 diabetes is not known. ?What increases the risk? ?The following factors may make you more likely to develop this condition: ?Having a family member with type 2 diabetes. ?Being overweight or obese. ?Being inactive (sedentary). ?Having been diagnosed with insulin resistance. ?Having a history of prediabetes, diabetes when you were pregnant (gestational diabetes), or polycystic ovary syndrome (PCOS). ?What are the signs or symptoms? ?In the early stage of this condition, you may not have symptoms. Symptoms develop slowly and may include: ?Increased thirst or hunger. ?Increased urination. ?Unexplained weight loss. ?Tiredness (fatigue) or weakness. ?Vision changes, such as blurry vision. ?Dark patches on the skin. ?How is this diagnosed? ?This condition is diagnosed based on your symptoms, your medical history, a physical exam, and your blood glucose level. Your blood glucose may be checked with one or more of the following blood tests: ?A fasting blood glucose (FBG) test. You will not be allowed to eat (you will fast) for 8 hours or longer before a blood sample is taken. ?A random blood glucose test. This test checks blood glucose at any time of day regardless of when you ate. ?An A1C (hemoglobin A1C) blood test. This test provides information about blood  glucose levels over the previous 2-3 months. ?An oral glucose tolerance test (OGTT). This test measures your blood glucose at two times: ?After fasting. This is your baseline blood glucose level. ?Two hours after drinking a beverage that contains glucose. ?You may be diagnosed with type 2 diabetes if: ?Your fasting blood glucose level is 126 mg/dL (7.0 mmol/L) or higher. ?Your random blood glucose level is 200 mg/dL (11.1 mmol/L) or higher. ?Your A1C level is 6.5% or higher. ?Your oral glucose tolerance test result is higher than 200 mg/dL (11.1 mmol/L). ?These blood tests may be repeated to confirm your diagnosis. ?How is this treated? ?Your treatment may be managed by a specialist called an endocrinologist. Type 2 diabetes may be treated by following instructions from your health care provider about: ?Making dietary and lifestyle changes. These may include: ?Following a personalized nutrition plan that is developed by a registered dietitian. ?Exercising regularly. ?Finding ways to manage stress. ?Checking your blood glucose level as often as told. ?Taking diabetes medicines or insulin daily. This helps to keep your blood glucose levels in the healthy range. ?Taking medicines to help prevent complications from diabetes. Medicines may include: ?Aspirin. ?Medicine to lower cholesterol. ?Medicine to control blood pressure. ?Your health care provider will set treatment goals for you. Your goals will be based on your age, other medical conditions you have, and how you respond to diabetes treatment. Generally, the goal of treatment is to maintain the following blood glucose levels: ?Before meals: 80-130 mg/dL (4.4-7.2 mmol/L). ?After meals: below 180 mg/dL (10 mmol/L). ?A1C level: less than 7%. ?Follow these instructions at home: ?Questions to ask your health care provider ?  Consider asking the following questions: ?Should I meet with a certified diabetes care and education specialist? ?What diabetes medicines do I need,  and when should I take them? ?What equipment will I need to manage my diabetes at home? ?How often do I need to check my blood glucose? ?Where can I find a support group for people with diabetes? ?What number can I call if I have questions? ?When is my next appointment? ?General instructions ?Take over-the-counter and prescription medicines only as told by your health care provider. ?Keep all follow-up visits. This is important. ?Where to find more information ?For help and guidance and for more information about diabetes, please visit: ?American Diabetes Association (ADA): www.diabetes.org ?American Association of Diabetes Care and Education Specialists (ADCES): www.diabeteseducator.org ?International Diabetes Federation (IDF): www.idf.org ?Contact a health care provider if: ?Your blood glucose is at or above 240 mg/dL (13.3 mmol/L) for 2 days in a row. ?You have been sick or have had a fever for 2 days or longer, and you are not getting better. ?You have any of the following problems for more than 6 hours: ?You cannot eat or drink. ?You have nausea and vomiting. ?You have diarrhea. ?Get help right away if: ?You have severe hypoglycemia. This means your blood glucose is lower than 54 mg/dL (3.0 mmol/L). ?You become confused or you have trouble thinking clearly. ?You have difficulty breathing. ?You have moderate or large ketone levels in your urine. ?These symptoms may represent a serious problem that is an emergency. Do not wait to see if the symptoms will go away. Get medical help right away. Call your local emergency services (911 in the [place] Do not drive yourself to the hospital. ?Summary ?Type 2 diabetes mellitus is a long-term, or chronic, disease. In type 2 diabetes, the pancreas does not make enough of a hormone called insulin, or cells in the body do not respond properly to insulin that the body makes. ?This condition is treated by making dietary and lifestyle changes and taking diabetes medicines or  insulin. ?Your health care provider will set treatment goals for you. Your goals will be based on your age, other medical conditions you have, and how you respond to diabetes treatment. ?Keep all follow-up visits. This is important. ?This information is not intended to replace advice given to you by your health care provider. Make sure you discuss any questions you have with your health care provider. ?Document Revised: 11/18/2020 Document Reviewed: 11/18/2020 ?Elsevier Patient Education ? 2022 Elsevier Inc. ? ?

## 2021-08-25 NOTE — Progress Notes (Signed)
Subjective:  Patient ID: Maureen Price, female    DOB: 02-07-1976  Age: 45 y.o. MRN: [id number]  CC: Diabetes  This visit occurred during the SARS-CoV-2 public health emergency.  Safety protocols were in place, including screening questions prior to the visit, additional usage of staff PPE, and extensive cleaning of exam room while observing appropriate contact time as indicated for disinfecting solutions.    HPI Maureen Price presents for f/up -   She recently had a left foot injury and wants an extended work note. She ran out of mounjaro a few weeks ago and her blood sugar have not been well controlled.  Outpatient Medications Prior to Visit  Medication Sig Dispense Refill   ALPRAZolam (XANAX) 0.5 MG tablet Take 1 tablet (0.5 mg total) by mouth 2 (two) times daily as needed for anxiety. 60 tablet 3   aspirin EC 81 MG tablet Take 1 tablet (81 mg total) by mouth daily. 90 tablet 1   atorvastatin (LIPITOR) 20 MG tablet Take 1 tablet (20 mg total) by mouth daily. 90 tablet 1   Bempedoic Acid-Ezetimibe (NEXLIZET) 180-10 MG TABS Take 1 tablet by mouth daily. 90 tablet 1   blood glucose meter kit and supplies Dispense based on patient and insurance preference. Use up to four times daily as directed. (FOR ICD-10 E10.9, E11.9). 1 each 0   Continuous Blood Gluc Receiver (FREESTYLE LIBRE 2 READER) DEVI 1 Act by Does not apply route daily. 2 each 5   Continuous Blood Gluc Sensor (FREESTYLE LIBRE 2 SENSOR) MISC 1 Act by Does not apply route daily. 2 each 5   dapagliflozin propanediol (FARXIGA) 10 MG TABS tablet Take 1 tablet (10 mg total) by mouth daily. 90 tablet 1   diclofenac (VOLTAREN) 75 MG EC tablet Take 1 tablet (75 mg total) by mouth 2 (two) times daily. 14 tablet 0   Fluticasone-Umeclidin-Vilant (TRELEGY ELLIPTA) 100-62.5-25 MCG/INH AEPB Inhale 1 puff into the lungs daily. 90 each 1   folic acid (FOLVITE) 1 MG tablet Take 1 tablet (1 mg total) by mouth daily. 90 tablet 1    Insulin Pen Needle (BD PEN NEEDLE NANO 2ND GEN) 32G X 4 MM MISC Inject 1 Act into the skin 4 (four) times daily. Use to inject insulin once per day. DX E11.8 200 each 2   Multiple Vitamin (MULTI-VITAMIN DAILY PO) Take 1 tablet by mouth daily.     nitroGLYCERIN (NITROSTAT) 0.4 MG SL tablet Place 1 tablet (0.4 mg total) under the tongue every 5 (five) minutes as needed for chest pain (Hold for SBP <100). 30 tablet 2   Oxycodone HCl 10 MG TABS Take 10 mg by mouth every 8 (eight) hours as needed.     thiamine (VITAMIN B-1) 50 MG tablet Take 1 tablet (50 mg total) by mouth daily. 90 tablet 1   tirzepatide (MOUNJARO) 5 MG/0.5ML Pen Inject 5 mg into the skin once a week. 6 mL 0   vitamin B-12 (CYANOCOBALAMIN) 1000 MCG tablet Take 2,000 mcg by mouth daily.     vitamin C (ASCORBIC ACID) 500 MG tablet Take 1,000 mg by mouth daily.     Insulin Glargine (BASAGLAR KWIKPEN) 100 UNIT/ML Inject 50 Units into the skin daily. 15 mL 3   insulin lispro (HUMALOG KWIKPEN) 200 UNIT/ML KwikPen Inject 15 Units into the skin 3 (three) times daily with meals. 15 mL 3   Facility-Administered Medications Prior to Visit  Medication Dose Route Frequency Provider Last Rate Last Admin   fentaNYL (  SUBLIMAZE) injection    Anesthesia Intra-op Suzy Bouchard, CRNA   50 mcg at 01/23/19 1308    ROS Review of Systems  Constitutional:  Negative for chills, diaphoresis, fatigue and fever.  HENT: Negative.    Eyes: Negative.   Respiratory:  Negative for cough, chest tightness, shortness of breath and wheezing.   Cardiovascular:  Negative for chest pain, palpitations and leg swelling.  Gastrointestinal:  Negative for abdominal pain, constipation, diarrhea and vomiting.  Endocrine: Positive for polydipsia, polyphagia and polyuria.  Genitourinary: Negative.  Negative for difficulty urinating, dysuria and hematuria.  Musculoskeletal: Negative.   Skin: Negative.   Neurological:  Negative for dizziness and weakness.  Hematological:   Negative for adenopathy. Does not bruise/bleed easily.  Psychiatric/Behavioral: Negative.     Objective:  BP 136/82    Pulse 98    Temp 98.2 F (36.8 C) (Oral)    Ht 5' 4"  (1.626 m)    Wt 219 lb (99.3 kg)    LMP 08/10/2021    SpO2 96%    BMI 37.59 kg/m   BP Readings from Last 3 Encounters:  08/25/21 136/82  08/23/21 (!) 141/82  07/31/21 116/63    Wt Readings from Last 3 Encounters:  08/25/21 219 lb (99.3 kg)  07/31/21 225 lb 15.5 oz (102.5 kg)  07/17/21 226 lb (102.5 kg)    Physical Exam Vitals reviewed.  Constitutional:      Appearance: She is not ill-appearing.  HENT:     Nose: Nose normal.     Mouth/Throat:     Mouth: Mucous membranes are moist.  Eyes:     General: No scleral icterus.    Conjunctiva/sclera: Conjunctivae normal.  Cardiovascular:     Rate and Rhythm: Normal rate and regular rhythm.     Heart sounds: No murmur heard. Pulmonary:     Effort: Pulmonary effort is normal.     Breath sounds: No stridor. No wheezing, rhonchi or rales.  Abdominal:     General: Abdomen is flat.     Palpations: There is no mass.     Tenderness: There is no abdominal tenderness. There is no guarding.     Hernia: No hernia is present.  Musculoskeletal:     Cervical back: Neck supple.  Lymphadenopathy:     Cervical: No cervical adenopathy.  Skin:    General: Skin is warm and dry.     Findings: No lesion.  Neurological:     General: No focal deficit present.     Mental Status: She is alert.  Psychiatric:        Mood and Affect: Mood normal.        Behavior: Behavior normal.    Lab Results  Component Value Date   WBC 12.3 (H) 07/17/2021   HGB 14.1 07/17/2021   HCT 41.0 07/17/2021   PLT 324 07/17/2021   GLUCOSE 132 (H) 08/25/2021   CHOL 151 04/14/2021   TRIG 217.0 (H) 04/14/2021   HDL 23.80 (L) 04/14/2021   LDLDIRECT 98.0 04/14/2021   LDLCALC 97 11/10/2019   ALT 19 07/17/2021   AST 14 (L) 07/17/2021   NA 137 08/25/2021   K 3.4 (L) 08/25/2021   CL 102  08/25/2021   CREATININE 1.37 (H) 08/25/2021   BUN 15 08/25/2021   CO2 26 08/25/2021   TSH 1.21 11/10/2019   INR 1.3 (H) 01/23/2019   HGBA1C 9.8 (H) 08/26/2021   MICROALBUR 34.1 (H) 04/14/2021    DG Foot Complete Left  Result Date: 08/23/2021 CLINICAL  DATA:  Foot pain, injury EXAM: LEFT FOOT - COMPLETE 3+ VIEW COMPARISON:  None. FINDINGS: No acute fracture or dislocation identified. Joint spaces are preserved. Posterior and plantar calcaneal spurs. No focal soft tissue swelling visualized. IMPRESSION: No acute osseous abnormality identified. Electronically Signed   By: Ofilia Neas M.D.   On: 08/23/2021 11:30    Assessment & Plan:   [place] was seen today for diabetes.  Diagnoses and all orders for this visit:  Diabetes 1.5, managed as type 2 ([place])- Her blood sugar is not adequately well controlled.  Will restart the GLP/GIP antagonist and I have asked her to be more compliant with the basal and bolus insulin. -     Basic metabolic panel; Future -     Hemoglobin A1c; Future -     Ambulatory referral to Ophthalmology -     Hemoglobin A1c -     Basic metabolic panel -     Insulin Glargine (BASAGLAR KWIKPEN) 100 UNIT/ML; Inject 70 Units into the skin daily. -     insulin aspart (NOVOLOG FLEXPEN) 100 UNIT/ML FlexPen; Inject 15 Units into the skin 3 (three) times daily with meals. -     KERENDIA 10 MG TABS; Take 1 tablet by mouth daily.  Microalbuminuria due to type 2 diabetes mellitus ([facility]) -     KERENDIA 10 MG TABS; Take 1 tablet by mouth daily.  Need for vaccination -     Pneumococcal conjugate vaccine 20-valent (Prevnar 20)  Cervical cancer screening -     Ambulatory referral to Gynecology  Stage 3a chronic kidney disease ([place]) -     KERENDIA 10 MG TABS; Take 1 tablet by mouth daily.  Diabetic nephropathy associated with type 2 diabetes mellitus ([place])- I recommended that she start an MRA for renal protection. -     KERENDIA 10 MG TABS; Take 1 tablet by mouth  daily.  Other orders -     Tdap vaccine greater than or equal to 7yo IM   I have discontinued Lorianne D. Mauck's Basaglar KwikPen and HumaLOG KwikPen. I am also having her start on [employer], NovoLOG FlexPen, and [place]. Additionally, I am having her maintain her vitamin B-12, Multiple Vitamin (MULTI-VITAMIN DAILY PO), vitamin C, blood glucose meter kit and supplies, Trelegy Ellipta, nitroGLYCERIN, Oxycodone HCl, atorvastatin, FreeStyle Libre 2 Sensor, [employer] 2 Reader, folic acid, aspirin EC, BD Pen Needle Nano 2nd Gen, thiamine, ALPRAZolam, Nexlizet, dapagliflozin propanediol, tirzepatide, and diclofenac.  Meds ordered this encounter  Medications   Insulin Glargine (BASAGLAR KWIKPEN) 100 UNIT/ML    Sig: Inject 70 Units into the skin daily.    Dispense:  63 mL    Refill:  1   insulin aspart (NOVOLOG FLEXPEN) 100 UNIT/ML FlexPen    Sig: Inject 15 Units into the skin 3 (three) times daily with meals.    Dispense:  42 mL    Refill:  1   KERENDIA 10 MG TABS    Sig: Take 1 tablet by mouth daily.    Dispense:  30 tablet    Refill:  0     Follow-up: Return in about 3 months (around 11/23/2021).  [name], MD

## 2021-08-26 ENCOUNTER — Other Ambulatory Visit: Payer: Commercial Managed Care - PPO

## 2021-08-26 DIAGNOSIS — E139 Other specified diabetes mellitus without complications: Secondary | ICD-10-CM

## 2021-08-27 ENCOUNTER — Encounter: Payer: Self-pay | Admitting: Internal Medicine

## 2021-08-27 DIAGNOSIS — N1831 Chronic kidney disease, stage 3a: Secondary | ICD-10-CM | POA: Insufficient documentation

## 2021-08-27 DIAGNOSIS — Z23 Encounter for immunization: Secondary | ICD-10-CM | POA: Insufficient documentation

## 2021-08-27 DIAGNOSIS — E1121 Type 2 diabetes mellitus with diabetic nephropathy: Secondary | ICD-10-CM | POA: Insufficient documentation

## 2021-08-27 LAB — HEMOGLOBIN A1C
Hgb A1c MFr Bld: 9.8 % of total Hgb — ABNORMAL HIGH (ref <5.7)
Mean Plasma Glucose: 235 mg/dL
eAG (mmol/L): 13 mmol/L

## 2021-08-27 MED ORDER — BASAGLAR KWIKPEN 100 UNIT/ML ~~LOC~~ SOPN: 70.0000 [IU] | PEN_INJECTOR | Freq: Every day | SUBCUTANEOUS | 1 refills | Status: DC

## 2021-08-27 MED ORDER — KERENDIA 10 MG PO TABS: 1.0000 | ORAL_TABLET | Freq: Every day | ORAL | 0 refills | Status: DC

## 2021-08-27 MED ORDER — NOVOLOG FLEXPEN 100 UNIT/ML ~~LOC~~ SOPN: 15.0000 [IU] | PEN_INJECTOR | Freq: Three times a day (TID) | SUBCUTANEOUS | 1 refills | Status: DC

## 2021-09-04 ENCOUNTER — Telehealth: Payer: Self-pay | Admitting: Internal Medicine

## 2021-09-04 NOTE — Telephone Encounter (Signed)
Documents sent again via [employer]

## 2021-09-04 NOTE — Telephone Encounter (Signed)
Shan from [facility] called about a referral that was received but she couldn't see the notes on it. Asked if the referral could be faxed again.

## 2021-10-02 ENCOUNTER — Encounter (HOSPITAL_COMMUNITY): Payer: Self-pay

## 2021-10-02 ENCOUNTER — Ambulatory Visit (HOSPITAL_COMMUNITY)
Admission: EM | Admit: 2021-10-02 | Discharge: 2021-10-02 | Disposition: A | Discharge status: Home / Self Care | Payer: Commercial Managed Care - PPO | Attending: Internal Medicine | Admitting: Internal Medicine

## 2021-10-02 ENCOUNTER — Other Ambulatory Visit: Payer: Self-pay

## 2021-10-02 ENCOUNTER — Encounter: Payer: Self-pay | Admitting: Internal Medicine

## 2021-10-02 DIAGNOSIS — R221 Localized swelling, mass and lump, neck: Secondary | ICD-10-CM

## 2021-10-02 NOTE — ED Provider Notes (Signed)
[place]    CSN: [id number] Arrival date & time: 10/02/21  0813      History   Chief Complaint No chief complaint on file.   HPI Maureen Price is a 46 y.o. female comes to urgent care with a 3-day history of swelling in the neck.  Patient denies any trauma to the neck.  She has a history of nodular goiter with predominant swelling in the right lobe of the thyroid gland.  Patient denies any pain on swallowing.  No sore throat, fever or chills.  Patient has a slight discomfort in the right side of the neck when she turns her head.  No diarrhea.  No sick contacts.   HPI  Past Medical History:  Diagnosis Date   Acute myocardial infarction of other anterior wall, initial episode of care    Anginal pain ([place])    Anxiety    Arthritis    Asthma    CAD (coronary artery disease)    CHF (congestive heart failure) ([place])    Depression    Diabetes mellitus    2002; no meds since 2004   Dyslipidemia, goal LDL below 70 10/26/2013   Dysrhythmia    GERD (gastroesophageal reflux disease)    Headache(784.0)    Obesity    Paroxysmal ventricular tachycardia    Shortness of breath    STEMI (ST elevation myocardial infarction) ([place])    Tobacco abuse     Patient Active Problem List   Diagnosis Date Noted   Stage 3a chronic kidney disease ([place]) 08/27/2021   Diabetic nephropathy associated with type 2 diabetes mellitus ([place]) 08/27/2021   Need for vaccination 08/27/2021   Diabetes 1.5, managed as type 2 ([place]) 07/28/2021   Anemia due to acquired thiamine deficiency 04/21/2021   Uterine leiomyoma 04/14/2021   Dietary folate deficiency anemia 04/14/2021   Routine general medical examination at a health care facility 11/10/2019   Cervical cancer screening 11/10/2019   Iron deficiency anemia due to chronic blood loss 11/10/2019   Chronic diastolic (congestive) heart failure (H[facility]) 01/23/2019   Panic anxiety syndrome 01/05/2019   Long-term current use of opiate analgesic  02/28/2018   Microalbuminuria due to type 2 diabetes mellitus ([place]) 06/24/2017   Visit for screening mammogram 06/23/2017   Asthma 09/18/2016   DDD (degenerative disc disease), lumbar 01/02/2015   Atherosclerosis of native artery of extremity with intermittent claudication ([place]) 03/05/2014   Tobacco use disorder 03/05/2014   Coronary artery disease 11/06/2013   Dyslipidemia, goal LDL below 70 10/26/2013   Obesity    Uncontrolled diabetes mellitus type 2 with atherosclerosis of arteries of extremities 07/07/2012   DJD (degenerative joint disease) of knee 07/07/2012    Past Surgical History:  Procedure Laterality Date   CESAREAN SECTION     CESAREAN SECTION  [phone]   CHOLECYSTECTOMY     CORONARY ANGIOPLASTY WITH STENT PLACEMENT     GALLBLADDER SURGERY     INCISION AND DRAINAGE PERIRECTAL ABSCESS N/A 01/23/2019   Procedure: IRRIGATION AND DEBRIDEMENT PERIRECTAL ABSCESS;  Surgeon: Rolm Bookbinder, MD;  Location: [facility];  Service: General;  Laterality: N/A;   LEFT HEART CATH N/A 10/24/2013   Procedure: LEFT HEART CATH;  Surgeon: Lorretta Harp, MD;  Location: [facility] CATH LAB;  Service: Cardiovascular;  Laterality: N/A;   LEFT HEART CATHETERIZATION WITH CORONARY ANGIOGRAM N/A 10/26/2013   Procedure: LEFT HEART CATHETERIZATION WITH CORONARY ANGIOGRAM;  Surgeon: Burnell Blanks, MD;  Location: [facility] CATH LAB;  Service: Cardiovascular;  Laterality: N/A;  LEFT HEART CATHETERIZATION WITH CORONARY ANGIOGRAM N/A 03/29/2014   Procedure: LEFT HEART CATHETERIZATION WITH CORONARY ANGIOGRAM;  Surgeon: Lorretta Harp, MD;  Location: [facility] CATH LAB;  Service: Cardiovascular;  Laterality: N/A;   TUBAL LIGATION     2000    OB History     Gravida  4   Para  3   Term  3   Preterm      AB  1   Living  3      SAB  1   IAB      Ectopic      Multiple      Live Births               Home Medications    Prior to Admission medications   Medication Sig Start Date End Date  Taking? Authorizing Provider  ALPRAZolam Duanne Moron) 0.5 MG tablet Take 1 tablet (0.5 mg total) by mouth 2 (two) times daily as needed for anxiety. 04/23/21   [name], MD  aspirin EC 81 MG tablet Take 1 tablet (81 mg total) by mouth daily. 04/14/21   [name], MD  atorvastatin (LIPITOR) 20 MG tablet Take 1 tablet (20 mg total) by mouth daily. 04/14/21   [name], MD  Bempedoic Acid-Ezetimibe (NEXLIZET) 180-10 MG TABS Take 1 tablet by mouth daily. 05/01/21   [name], MD  blood glucose meter kit and supplies Dispense based on patient and insurance preference. Use up to four times daily as directed. (FOR ICD-10 E10.9, E11.9). 02/06/19   Patrecia Pour, MD  Continuous Blood Gluc Receiver (FREESTYLE LIBRE 2 READER) DEVI 1 Act by Does not apply route daily. 04/14/21   [name], MD  Continuous Blood Gluc Sensor (FREESTYLE LIBRE 2 SENSOR) MISC 1 Act by Does not apply route daily. 04/14/21   [name], MD  dapagliflozin propanediol (FARXIGA) 10 MG TABS tablet Take 1 tablet (10 mg total) by mouth daily. 05/01/21   [name], MD  diclofenac (VOLTAREN) 75 MG EC tablet Take 1 tablet (75 mg total) by mouth 2 (two) times daily. 08/23/21   Vanessa Kick, MD  Fluticasone-Umeclidin-Vilant (TRELEGY ELLIPTA) 100-62.5-25 MCG/INH AEPB Inhale 1 puff into the lungs daily. 07/16/20   [name], MD  folic acid (FOLVITE) 1 MG tablet Take 1 tablet (1 mg total) by mouth daily. 04/14/21   [name], MD  insulin aspart (NOVOLOG FLEXPEN) 100 UNIT/ML FlexPen Inject 15 Units into the skin 3 (three) times daily with meals. 08/27/21   [name], MD  Insulin Glargine [facility] KWIKPEN) 100 UNIT/ML Inject 70 Units into the skin daily. 08/27/21   [name], MD  Insulin Pen Needle (BD PEN NEEDLE NANO 2ND GEN) 32G X 4 MM MISC Inject 1 Act into the skin 4 (four) times daily. Use to inject insulin once per day. DX E11.8 04/14/21   [name], MD  KERENDIA 10 MG TABS Take 1 tablet by mouth  daily. 08/27/21   [name], MD  Multiple Vitamin (MULTI-VITAMIN DAILY PO) Take 1 tablet by mouth daily.    [provider]  nitroGLYCERIN (NITROSTAT) 0.4 MG SL tablet Place 1 tablet (0.4 mg total) under the tongue every 5 (five) minutes as needed for chest pain (Hold for SBP <100). 08/14/20   Lorretta Harp, MD  Oxycodone HCl 10 MG TABS Take 10 mg by mouth every 8 (eight) hours as needed. 03/01/21   [provider]  thiamine (VITAMIN B-1) 50 MG tablet Take 1 tablet (50 mg total) by mouth daily. 04/21/21   [name], MD  tirzepatide [facility]) 5 MG/0.5ML Pen Inject 5 mg into the skin once a week. 08/22/21   [name], MD  vitamin B-12 (CYANOCOBALAMIN) 1000 MCG tablet Take 2,000 mcg by mouth daily.    [provider]  vitamin C (ASCORBIC ACID) 500 MG tablet Take 1,000 mg by mouth daily.    [provider]    Family History Family History  Problem Relation Age of Onset   Arthritis Mother    Diabetes Mother    Heart disease Father    Stroke Father    Diabetes Maternal Grandfather    Cancer Neg Hx    Alcohol abuse Neg Hx    Early death Neg Hx    Kidney disease Neg Hx    Hypertension Neg Hx    Hyperlipidemia Neg Hx    Thyroid disease Neg Hx     Social History Social History   Tobacco Use   Smoking status: Every Day    Packs/day: 0.50    Years: 20.00    Pack years: 10.00    Types: Cigarettes   Smokeless tobacco: Never   Tobacco comments:    8-10 cigs/day  Vaping Use   Vaping Use: Never used  Substance Use Topics   Alcohol use: Yes    Comment: occasional   Drug use: No     Allergies   Erythromycin, Penicillins, Metformin and related, Viibryd [vilazodone hcl], and Bee venom   Review of Systems Review of Systems  Respiratory: Negative.    Gastrointestinal: Negative.   Genitourinary: Negative.   Musculoskeletal:  Positive for neck pain. Negative for arthralgias, gait problem, joint swelling, myalgias and neck  stiffness.  Neurological: Negative.     Physical Exam Triage Vital Signs ED Triage Vitals  Enc Vitals Group     BP 10/02/21 0837 138/68     Pulse Rate 10/02/21 0837 90     Resp 10/02/21 0837 16     Temp 10/02/21 0837 98.3 F (36.8 C)     Temp Source 10/02/21 0837 Oral     SpO2 10/02/21 0837 100 %     Weight --      Height --      Head Circumference --      Peak Flow --      Pain Score 10/02/21 0838 8     Pain Loc --      Pain Edu? --      Excl. in [place]? --    No data found.  Updated Vital Signs BP 138/68 (BP Location: Left Arm)    Pulse 90    Temp 98.3 F (36.8 C) (Oral)    Resp 16    LMP 09/30/2021 (Approximate)    SpO2 100%   Visual Acuity Right Eye Distance:   Left Eye Distance:   Bilateral Distance:    Right Eye Near:   Left Eye Near:    Bilateral Near:     Physical Exam Vitals and nursing note reviewed.  Constitutional:      Appearance: Normal appearance.  HENT:     Head:     Comments: Enlargement of the right lobe of the thyroid.  Nodular surface.  No adenopathy.  Nontender to palpation.  Moves with swallowing.    Right Ear: Tympanic membrane normal.     Left Ear: Tympanic membrane normal.  Cardiovascular:     Rate  and Rhythm: Normal rate and regular rhythm.  Neurological:     Mental Status: She is alert.     UC Treatments / Results  Labs (all labs ordered are listed, but only abnormal results are displayed) Labs Reviewed - No data to display  EKG   Radiology No results found.  Procedures Procedures (including critical care time)  Medications Ordered in UC Medications - No data to display  Initial Impression / Assessment and Plan / UC Course  I have reviewed the triage vital signs and the nursing notes.  Pertinent labs & imaging results that were available during my care of the patient were reviewed by me and considered in my medical decision making (see chart for details).     1.  Thyroid enlargement: Patient is advised to follow-up  with primary care physician for thyroid ultrasound.  Patient had thyroid ultrasound in 2019 with recommendation for 1 year follow-up.  Patient has not followed up with the primary care physician.  She is encouraged to make an appointment and get a follow-up thyroid ultrasound completed.  Patient has scheduled an appointment with her primary care physician. Return precautions given. Final Clinical Impressions(s) / UC Diagnoses   Final diagnoses:  Neck swelling     Discharge Instructions      Patient advised to follow-up with primary care physician for thyroid ultrasound She had ultrasound done in 2019 which was remarkable for thyroid nodule in the right thyroid gland with recommendation of 1 year follow-up.  Patient has messaged her primary care physician to get the ultrasound done.   ED Prescriptions   None    PDMP not reviewed this encounter.   Chase Picket, MD 10/02/21 1800

## 2021-10-02 NOTE — Discharge Instructions (Addendum)
Patient advised to follow-up with primary care physician for thyroid ultrasound She had ultrasound done in 2019 which was remarkable for thyroid nodule in the right thyroid gland with recommendation of 1 year follow-up.  Patient has messaged her primary care physician to get the ultrasound done.

## 2021-10-02 NOTE — ED Triage Notes (Signed)
Pt reports knot on her throat x 3 days. Pt does have pain when she turns her head.

## 2021-10-06 ENCOUNTER — Ambulatory Visit: Payer: Self-pay | Admitting: Internal Medicine

## 2021-11-04 ENCOUNTER — Encounter: Payer: Self-pay | Admitting: Internal Medicine

## 2021-11-11 ENCOUNTER — Ambulatory Visit: Payer: Self-pay | Admitting: Internal Medicine

## 2021-11-12 ENCOUNTER — Ambulatory Visit: Payer: Self-pay | Admitting: Internal Medicine

## 2021-12-31 ENCOUNTER — Encounter (HOSPITAL_BASED_OUTPATIENT_CLINIC_OR_DEPARTMENT_OTHER): Payer: Self-pay

## 2021-12-31 ENCOUNTER — Other Ambulatory Visit: Payer: Self-pay

## 2021-12-31 ENCOUNTER — Emergency Department (HOSPITAL_BASED_OUTPATIENT_CLINIC_OR_DEPARTMENT_OTHER)
Admission: EM | Admit: 2021-12-31 | Discharge: 2021-12-31 | Disposition: A | Discharge status: Home / Self Care | Payer: BC Managed Care – PPO | Attending: Emergency Medicine | Admitting: Emergency Medicine

## 2021-12-31 DIAGNOSIS — E1165 Type 2 diabetes mellitus with hyperglycemia: Secondary | ICD-10-CM | POA: Insufficient documentation

## 2021-12-31 DIAGNOSIS — Z7984 Long term (current) use of oral hypoglycemic drugs: Secondary | ICD-10-CM | POA: Insufficient documentation

## 2021-12-31 DIAGNOSIS — Z7982 Long term (current) use of aspirin: Secondary | ICD-10-CM | POA: Diagnosis not present

## 2021-12-31 DIAGNOSIS — R739 Hyperglycemia, unspecified: Secondary | ICD-10-CM

## 2021-12-31 DIAGNOSIS — Z794 Long term (current) use of insulin: Secondary | ICD-10-CM | POA: Diagnosis not present

## 2021-12-31 DIAGNOSIS — R5383 Other fatigue: Secondary | ICD-10-CM | POA: Diagnosis present

## 2021-12-31 LAB — CBG MONITORING, ED: Glucose-Capillary: 257 mg/dL — ABNORMAL HIGH (ref 70–99)

## 2021-12-31 LAB — CBC WITH DIFFERENTIAL/PLATELET
Abs Immature Granulocytes: 0.04 10*3/uL (ref 0.00–0.07)
Basophils Absolute: 0.1 10*3/uL (ref 0.0–0.1)
Basophils Relative: 1 %
Eosinophils Absolute: 0.2 10*3/uL (ref 0.0–0.5)
Eosinophils Relative: 2 %
HCT: 33 % — ABNORMAL LOW (ref 36.0–46.0)
Hemoglobin: 10.9 g/dL — ABNORMAL LOW (ref 12.0–15.0)
Immature Granulocytes: 0 %
Lymphocytes Relative: 33 %
Lymphs Abs: 3 10*3/uL (ref 0.7–4.0)
MCH: 26.8 pg (ref 26.0–34.0)
MCHC: 33 g/dL (ref 30.0–36.0)
MCV: 81.1 fL (ref 80.0–100.0)
Monocytes Absolute: 0.6 10*3/uL (ref 0.1–1.0)
Monocytes Relative: 6 %
Neutro Abs: 5.3 10*3/uL (ref 1.7–7.7)
Neutrophils Relative %: 58 %
Platelets: 309 10*3/uL (ref 150–400)
RBC: 4.07 MIL/uL (ref 3.87–5.11)
RDW: 14.1 % (ref 11.5–15.5)
WBC: 9.2 10*3/uL (ref 4.0–10.5)
nRBC: 0 % (ref 0.0–0.2)

## 2021-12-31 LAB — BASIC METABOLIC PANEL
Anion gap: 9 (ref 5–15)
BUN: 18 mg/dL (ref 6–20)
CO2: 23 mmol/L (ref 22–32)
Calcium: 9.5 mg/dL (ref 8.9–10.3)
Chloride: 106 mmol/L (ref 98–111)
Creatinine, Ser: 1.32 mg/dL — ABNORMAL HIGH (ref 0.44–1.00)
GFR, Estimated: 51 mL/min — ABNORMAL LOW (ref >60)
Glucose, Bld: 249 mg/dL — ABNORMAL HIGH (ref 70–99)
Potassium: 3.7 mmol/L (ref 3.5–5.1)
Sodium: 138 mmol/L (ref 135–145)

## 2021-12-31 NOTE — ED Notes (Signed)
CBG 257 ng/dL reported to Dillon Bjork, RN ?

## 2021-12-31 NOTE — ED Triage Notes (Signed)
She c/o feeling sluggish and fatigued "I know my blood sugar is high again". She is ambulatory and in no distress. She denies fever, nor any other sign of current illness. ?

## 2021-12-31 NOTE — Discharge Instructions (Signed)
Follow-up with your primary care doctor.  Take your regular insulin as previously prescribed.  Recommend monitoring your blood sugars at mealtime and at night before going to bed.  Contact ER if you develop new concerning symptoms. ?

## 2022-01-02 NOTE — ED Provider Notes (Signed)
?[facility] EMERGENCY DEPT ?Provider Note ? ? ?CSN: [id number] ?Arrival date & time: 12/31/21  0727 ? ?  ? ?History ? ?Chief Complaint  ?Patient presents with  ? Hyperglycemia  ? ? ?Starasia D Peregrina is a 46 y.o. female.  Presented to the ER due to concern for elevated blood sugar.  Patient states that she woke up this morning and felt fatigued, sluggish, no energy.  Patient states that she has felt this way when her blood sugars been elevated before.  Has type 2 diabetes, on insulin.  States that she did not take her morning insulin.  Wanted to come to ER to get checked out.  She denies any chest pain, difficulty breathing, no nausea, no vomiting, no abdominal pain.  No fevers or chills. ? ?HPI ? ?  ? ?Home Medications ?Prior to Admission medications   ?Medication Sig Start Date End Date Taking? Authorizing Provider  ?ALPRAZolam (XANAX) 0.5 MG tablet Take 1 tablet (0.5 mg total) by mouth 2 (two) times daily as needed for anxiety. 04/23/21   Jones, Thomas L, MD  ?aspirin EC 81 MG tablet Take 1 tablet (81 mg total) by mouth daily. 04/14/21   Jones, Thomas L, MD  ?atorvastatin (LIPITOR) 20 MG tablet Take 1 tablet (20 mg total) by mouth daily. 04/14/21   Jones, Thomas L, MD  ?Bempedoic Acid-Ezetimibe (NEXLIZET) 180-10 MG TABS Take 1 tablet by mouth daily. 05/01/21   Jones, Thomas L, MD  ?blood glucose meter kit and supplies Dispense based on patient and insurance preference. Use up to four times daily as directed. (FOR ICD-10 E10.9, E11.9). 02/06/19   Grunz, Ryan B, MD  ?Continuous Blood Gluc Receiver (FREESTYLE LIBRE 2 READER) DEVI 1 Act by Does not apply route daily. 04/14/21   Jones, Thomas L, MD  ?Continuous Blood Gluc Sensor (FREESTYLE LIBRE 2 SENSOR) MISC 1 Act by Does not apply route daily. 04/14/21   Jones, Thomas L, MD  ?dapagliflozin propanediol (FARXIGA) 10 MG TABS tablet Take 1 tablet (10 mg total) by mouth daily. 05/01/21   Jones, Thomas L, MD  ?diclofenac (VOLTAREN) 75 MG EC tablet Take 1 tablet (75 mg  total) by mouth 2 (two) times daily. 08/23/21   Hagler, Brian, MD  ?Fluticasone-Umeclidin-Vilant (TRELEGY ELLIPTA) 100-62.5-25 MCG/INH AEPB Inhale 1 puff into the lungs daily. 07/16/20   Jones, Thomas L, MD  ?folic acid (FOLVITE) 1 MG tablet Take 1 tablet (1 mg total) by mouth daily. 04/14/21   Jones, Thomas L, MD  ?insulin aspart (NOVOLOG FLEXPEN) 100 UNIT/ML FlexPen Inject 15 Units into the skin 3 (three) times daily with meals. 08/27/21   Jones, Thomas L, MD  ?Insulin Glargine (BASAGLAR KWIKPEN) 100 UNIT/ML Inject 70 Units into the skin daily. 08/27/21   Jones, Thomas L, MD  ?Insulin Pen Needle (BD PEN NEEDLE NANO 2ND GEN) 32G X 4 MM MISC Inject 1 Act into the skin 4 (four) times daily. Use to inject insulin once per day. DX E11.8 04/14/21   Jones, Thomas L, MD  ?KERENDIA 10 MG TABS Take 1 tablet by mouth daily. 08/27/21   Jones, Thomas L, MD  ?Multiple Vitamin (MULTI-VITAMIN DAILY PO) Take 1 tablet by mouth daily.    [provider]  ?nitroGLYCERIN (NITROSTAT) 0.4 MG SL tablet Place 1 tablet (0.4 mg total) under the tongue every 5 (five) minutes as needed for chest pain (Hold for SBP <100). 08/14/20   Berry, Jonathan J, MD  ?Oxycodone HCl 10 MG TABS Take 10 mg by mouth every 8 (  eight) hours as needed. 03/01/21   [provider]  ?thiamine (VITAMIN B-1) 50 MG tablet Take 1 tablet (50 mg total) by mouth daily. 04/21/21   [name], MD  ?tirzepatide [facility]) 5 MG/0.5ML Pen Inject 5 mg into the skin once a week. 08/22/21   [name], MD  ?vitamin B-12 (CYANOCOBALAMIN) 1000 MCG tablet Take 2,000 mcg by mouth daily.    [provider]  ?vitamin C (ASCORBIC ACID) 500 MG tablet Take 1,000 mg by mouth daily.    [provider]  ?   ? ?Allergies    ?Erythromycin, Penicillins, Metformin and related, Viibryd [vilazodone hcl], and Bee venom   ? ?Review of Systems   ?Review of Systems  ?Constitutional:  Positive for fatigue. Negative for chills and fever.  ?HENT:  Negative for ear  pain and sore throat.   ?Eyes:  Negative for pain and visual disturbance.  ?Respiratory:  Negative for cough and shortness of breath.   ?Cardiovascular:  Negative for chest pain and palpitations.  ?Gastrointestinal:  Negative for abdominal pain and vomiting.  ?Genitourinary:  Negative for dysuria and hematuria.  ?Musculoskeletal:  Negative for arthralgias and back pain.  ?Skin:  Negative for color change and rash.  ?Neurological:  Negative for seizures and syncope.  ?All other systems reviewed and are negative. ? ?Physical Exam ?Updated Vital Signs ?BP (!) 136/97 (BP Location: Right Arm)   Pulse 81   Temp 98.2 ?F (36.8 ?C) (Oral)   Resp 17   LMP  (LMP Unknown)   SpO2 98%  ?Physical Exam ?Vitals and nursing note reviewed.  ?Constitutional:   ?   General: She is not in acute distress. ?   Appearance: She is well-developed.  ?HENT:  ?   Head: Normocephalic and atraumatic.  ?Eyes:  ?   Conjunctiva/sclera: Conjunctivae normal.  ?Cardiovascular:  ?   Rate and Rhythm: Normal rate and regular rhythm.  ?   Heart sounds: No murmur heard. ?Pulmonary:  ?   Effort: Pulmonary effort is normal. No respiratory distress.  ?   Breath sounds: Normal breath sounds.  ?Abdominal:  ?   Palpations: Abdomen is soft.  ?   Tenderness: There is no abdominal tenderness.  ?Musculoskeletal:     ?   General: No swelling.  ?   Cervical back: Neck supple.  ?Skin: ?   General: Skin is warm and dry.  ?   Capillary Refill: Capillary refill takes less than 2 seconds.  ?Neurological:  ?   Mental Status: She is alert.  ?Psychiatric:     ?   Mood and Affect: Mood normal.  ? ? ?ED Results / Procedures / Treatments   ?Labs ?(all labs ordered are listed, but only abnormal results are displayed) ?Labs Reviewed  ?CBC WITH DIFFERENTIAL/PLATELET - Abnormal; Notable for the following components:  ?    Result Value  ? Hemoglobin 10.9 (*)   ? HCT 33.0 (*)   ? All other components within normal limits  ?BASIC METABOLIC PANEL - Abnormal; Notable for the following  components:  ? Glucose, Bld 249 (*)   ? Creatinine, Ser 1.32 (*)   ? GFR, Estimated 51 (*)   ? All other components within normal limits  ?CBG MONITORING, ED - Abnormal; Notable for the following components:  ? Glucose-Capillary 257 (*)   ? All other components within normal limits  ? ? ?EKG ?None ? ?Radiology ?No results found. ? ?Procedures ?Procedures  ? ? ?Medications Ordered in ED ?Medications -  No data to display ? ?ED Course/ Medical Decision Making/ A&P ?  ?                        ?Medical Decision Making ?Amount and/or Complexity of Data Reviewed ?Labs: ordered. ? ? ?46 year old lady with history of type 2 diabetes on insulin presenting to the emergency room due to concern for elevated blood sugar and feeling fatigued, sluggish.  On physical exam she is remarkably well-appearing in no acute distress with normal vital signs grossly.  Slight hypertension.  Her blood sugar is modestly elevated.  Check basic labs, no evidence for DKA at this time.  Some hyperglycemia but no change in kidney function, no change in bicarbonate level.  No anemia or electrolyte derangement.  On reassessment patient states that she is feeling better.  Denies ongoing medical complaints at this time.  Given her current clinical appearance, work-up that was completed today, feel she can be discharged and managed in the outpatient setting.  Advised follow-up with primary care doctor to discuss insulin regimen.  Advise compliance with her insulin regimen. ? ? ? ?After the discussed management above, the patient was determined to be safe for discharge.  The patient was in agreement with this plan and all questions regarding their care were answered.  ED return precautions were discussed and the patient will return to the ED with any significant worsening of condition. ? ? ? ? ? ? ? ? ?Final Clinical Impression(s) / ED Diagnoses ?Final diagnoses:  ?Hyperglycemia  ? ? ?Rx / DC Orders ?ED Discharge Orders   ? ? None  ? ?  ? ? ?  ?Lucrezia Starch, MD ?01/02/22 [id number] ? ?

## 2022-01-09 ENCOUNTER — Ambulatory Visit (INDEPENDENT_AMBULATORY_CARE_PROVIDER_SITE_OTHER): Payer: BC Managed Care – PPO | Admitting: Internal Medicine

## 2022-01-09 ENCOUNTER — Encounter: Payer: Self-pay | Admitting: Internal Medicine

## 2022-01-09 ENCOUNTER — Telehealth: Payer: Self-pay

## 2022-01-09 VITALS — BP 136/78 | HR 91 | Temp 97.9°F | Resp 16 | Ht 64.0 in | Wt 222.0 lb

## 2022-01-09 DIAGNOSIS — I10 Essential (primary) hypertension: Secondary | ICD-10-CM

## 2022-01-09 DIAGNOSIS — R809 Proteinuria, unspecified: Secondary | ICD-10-CM | POA: Diagnosis not present

## 2022-01-09 DIAGNOSIS — F410 Panic disorder [episodic paroxysmal anxiety] without agoraphobia: Secondary | ICD-10-CM

## 2022-01-09 DIAGNOSIS — E119 Type 2 diabetes mellitus without complications: Secondary | ICD-10-CM | POA: Diagnosis not present

## 2022-01-09 DIAGNOSIS — Z23 Encounter for immunization: Secondary | ICD-10-CM

## 2022-01-09 DIAGNOSIS — E139 Other specified diabetes mellitus without complications: Secondary | ICD-10-CM | POA: Diagnosis not present

## 2022-01-09 DIAGNOSIS — E785 Hyperlipidemia, unspecified: Secondary | ICD-10-CM | POA: Diagnosis not present

## 2022-01-09 DIAGNOSIS — F411 Generalized anxiety disorder: Secondary | ICD-10-CM | POA: Diagnosis not present

## 2022-01-09 DIAGNOSIS — Z794 Long term (current) use of insulin: Secondary | ICD-10-CM

## 2022-01-09 DIAGNOSIS — E1121 Type 2 diabetes mellitus with diabetic nephropathy: Secondary | ICD-10-CM

## 2022-01-09 DIAGNOSIS — D538 Other specified nutritional anemias: Secondary | ICD-10-CM

## 2022-01-09 DIAGNOSIS — N1831 Chronic kidney disease, stage 3a: Secondary | ICD-10-CM

## 2022-01-09 DIAGNOSIS — E1129 Type 2 diabetes mellitus with other diabetic kidney complication: Secondary | ICD-10-CM

## 2022-01-09 DIAGNOSIS — E519 Thiamine deficiency, unspecified: Secondary | ICD-10-CM

## 2022-01-09 DIAGNOSIS — D520 Dietary folate deficiency anemia: Secondary | ICD-10-CM

## 2022-01-09 DIAGNOSIS — D500 Iron deficiency anemia secondary to blood loss (chronic): Secondary | ICD-10-CM | POA: Diagnosis not present

## 2022-01-09 DIAGNOSIS — Z1211 Encounter for screening for malignant neoplasm of colon: Secondary | ICD-10-CM

## 2022-01-09 DIAGNOSIS — I2510 Atherosclerotic heart disease of native coronary artery without angina pectoris: Secondary | ICD-10-CM

## 2022-01-09 DIAGNOSIS — I70213 Atherosclerosis of native arteries of extremities with intermittent claudication, bilateral legs: Secondary | ICD-10-CM

## 2022-01-09 LAB — IBC + FERRITIN
Ferritin: 23.2 ng/mL (ref 10.0–291.0)
Iron: 27 ug/dL — ABNORMAL LOW (ref 42–145)
Saturation Ratios: 9.2 % — ABNORMAL LOW (ref 20.0–50.0)
TIBC: 294 ug/dL (ref 250.0–450.0)
Transferrin: 210 mg/dL — ABNORMAL LOW (ref 212.0–360.0)

## 2022-01-09 LAB — URINALYSIS, ROUTINE W REFLEX MICROSCOPIC
Bilirubin Urine: NEGATIVE
Ketones, ur: NEGATIVE
Leukocytes,Ua: NEGATIVE
Nitrite: NEGATIVE
RBC / HPF: NONE SEEN (ref 0–?)
Specific Gravity, Urine: 1.01 (ref 1.000–1.030)
Total Protein, Urine: 30 — AB
Urine Glucose: >=1000 — AB
Urobilinogen, UA: 0.2 (ref 0.0–1.0)
pH: 5.5 (ref 5.0–8.0)

## 2022-01-09 LAB — BASIC METABOLIC PANEL
BUN: 17 mg/dL (ref 6–23)
CO2: 25 mEq/L (ref 19–32)
Calcium: 9.4 mg/dL (ref 8.4–10.5)
Chloride: 105 mEq/L (ref 96–112)
Creatinine, Ser: 1.4 mg/dL — ABNORMAL HIGH (ref 0.40–1.20)
GFR: 45.38 mL/min — ABNORMAL LOW (ref >60.00)
Glucose, Bld: 98 mg/dL (ref 70–99)
Potassium: 3.8 mEq/L (ref 3.5–5.1)
Sodium: 139 mEq/L (ref 135–145)

## 2022-01-09 LAB — CBC WITH DIFFERENTIAL/PLATELET
Basophils Absolute: 0.1 10*3/uL (ref 0.0–0.1)
Basophils Relative: 0.6 % (ref 0.0–3.0)
Eosinophils Absolute: 0.2 10*3/uL (ref 0.0–0.7)
Eosinophils Relative: 1.7 % (ref 0.0–5.0)
HCT: 37.6 % (ref 36.0–46.0)
Hemoglobin: 12.4 g/dL (ref 12.0–15.0)
Lymphocytes Relative: 33.2 % (ref 12.0–46.0)
Lymphs Abs: 3.1 10*3/uL (ref 0.7–4.0)
MCHC: 33.1 g/dL (ref 30.0–36.0)
MCV: 83.1 fl (ref 78.0–100.0)
Monocytes Absolute: 0.7 10*3/uL (ref 0.1–1.0)
Monocytes Relative: 7.7 % (ref 3.0–12.0)
Neutro Abs: 5.4 10*3/uL (ref 1.4–7.7)
Neutrophils Relative %: 56.8 % (ref 43.0–77.0)
Platelets: 360 10*3/uL (ref 150.0–400.0)
RBC: 4.52 Mil/uL (ref 3.87–5.11)
RDW: 15 % (ref 11.5–15.5)
WBC: 9.4 10*3/uL (ref 4.0–10.5)

## 2022-01-09 LAB — FOLATE: Folate: 4.4 ng/mL — ABNORMAL LOW (ref >5.9)

## 2022-01-09 LAB — TSH: TSH: 1.43 u[IU]/mL (ref 0.35–5.50)

## 2022-01-09 LAB — HEMOGLOBIN A1C: Hgb A1c MFr Bld: 7 % — ABNORMAL HIGH (ref 4.6–6.5)

## 2022-01-09 LAB — VITAMIN B12: Vitamin B-12: 806 pg/mL (ref 211–911)

## 2022-01-09 LAB — MICROALBUMIN / CREATININE URINE RATIO
Creatinine,U: 72.4 mg/dL
Microalb Creat Ratio: 36.6 mg/g — ABNORMAL HIGH (ref 0.0–30.0)
Microalb, Ur: 26.5 mg/dL — ABNORMAL HIGH (ref 0.0–1.9)

## 2022-01-09 MED ORDER — FOLIC ACID 1 MG PO TABS: 1.0000 mg | ORAL_TABLET | Freq: Every day | ORAL | 1 refills | Status: DC

## 2022-01-09 MED ORDER — ASPIRIN EC 81 MG PO TBEC: 81.0000 mg | DELAYED_RELEASE_TABLET | Freq: Every day | ORAL | 1 refills | Status: DC

## 2022-01-09 MED ORDER — DAPAGLIFLOZIN PROPANEDIOL 10 MG PO TABS: 10.0000 mg | ORAL_TABLET | Freq: Every day | ORAL | 1 refills | Status: DC

## 2022-01-09 MED ORDER — TIRZEPATIDE 5 MG/0.5ML ~~LOC~~ SOAJ: 5.0000 mg | SUBCUTANEOUS | 0 refills | Status: DC

## 2022-01-09 MED ORDER — ATORVASTATIN CALCIUM 20 MG PO TABS: 20.0000 mg | ORAL_TABLET | Freq: Every day | ORAL | 1 refills | Status: DC

## 2022-01-09 MED ORDER — ACCRUFER 30 MG PO CAPS: 1.0000 | ORAL_CAPSULE | Freq: Two times a day (BID) | ORAL | 1 refills | Status: DC

## 2022-01-09 MED ORDER — ALPRAZOLAM 0.5 MG PO TABS: 0.5000 mg | ORAL_TABLET | Freq: Two times a day (BID) | ORAL | 3 refills | Status: DC | PRN

## 2022-01-09 MED ORDER — NEXLIZET 180-10 MG PO TABS: 1.0000 | ORAL_TABLET | Freq: Every day | ORAL | 1 refills | Status: DC

## 2022-01-09 MED ORDER — VITAMIN B-1 50 MG PO TABS: 50.0000 mg | ORAL_TABLET | Freq: Every day | ORAL | 1 refills | Status: DC

## 2022-01-09 NOTE — Progress Notes (Signed)
? ?Subjective:  ?Patient ID: Maureen Price, female    DOB: Oct 19, 1975  Age: 46 y.o. MRN: [id number] ? ?CC: Anemia, Hypertension, Diabetes, and Coronary Artery Disease ? ? ?HPI ?[name] presents for f/up -  ? ?She complains of chronic fatigue but denies chest pain, shortness of breath, diaphoresis, dizziness, lightheadedness, or edema. ? ?Outpatient Medications Prior to Visit  ?Medication Sig Dispense Refill  ? Fluticasone-Umeclidin-Vilant (TRELEGY ELLIPTA) 100-62.5-25 MCG/INH AEPB Inhale 1 puff into the lungs daily. 90 each 1  ? insulin aspart (NOVOLOG FLEXPEN) 100 UNIT/ML FlexPen Inject 15 Units into the skin 3 (three) times daily with meals. 42 mL 1  ? Insulin Glargine (BASAGLAR KWIKPEN) 100 UNIT/ML Inject 70 Units into the skin daily. 63 mL 1  ? Insulin Pen Needle (BD PEN NEEDLE NANO 2ND GEN) 32G X 4 MM MISC Inject 1 Act into the skin 4 (four) times daily. Use to inject insulin once per day. DX E11.8 200 each 2  ? KERENDIA 10 MG TABS Take 1 tablet by mouth daily. 30 tablet 0  ? Multiple Vitamin (MULTI-VITAMIN DAILY PO) Take 1 tablet by mouth daily.    ? nitroGLYCERIN (NITROSTAT) 0.4 MG SL tablet Place 1 tablet (0.4 mg total) under the tongue every 5 (five) minutes as needed for chest pain (Hold for SBP <100). 30 tablet 2  ? Oxycodone HCl 10 MG TABS Take 10 mg by mouth every 8 (eight) hours as needed.    ? vitamin B-12 (CYANOCOBALAMIN) 1000 MCG tablet Take 2,000 mcg by mouth daily.    ? ALPRAZolam (XANAX) 0.5 MG tablet Take 1 tablet (0.5 mg total) by mouth 2 (two) times daily as needed for anxiety. 60 tablet 3  ? aspirin EC 81 MG tablet Take 1 tablet (81 mg total) by mouth daily. 90 tablet 1  ? atorvastatin (LIPITOR) 20 MG tablet Take 1 tablet (20 mg total) by mouth daily. 90 tablet 1  ? Bempedoic Acid-Ezetimibe (NEXLIZET) 180-10 MG TABS Take 1 tablet by mouth daily. 90 tablet 1  ? blood glucose meter kit and supplies Dispense based on patient and insurance preference. Use up to four times daily  as directed. (FOR ICD-10 E10.9, E11.9). 1 each 0  ? Continuous Blood Gluc Receiver (FREESTYLE LIBRE 2 READER) DEVI 1 Act by Does not apply route daily. 2 each 5  ? Continuous Blood Gluc Sensor (FREESTYLE LIBRE 2 SENSOR) MISC 1 Act by Does not apply route daily. 2 each 5  ? dapagliflozin propanediol (FARXIGA) 10 MG TABS tablet Take 1 tablet (10 mg total) by mouth daily. 90 tablet 1  ? diclofenac (VOLTAREN) 75 MG EC tablet Take 1 tablet (75 mg total) by mouth 2 (two) times daily. 14 tablet 0  ? folic acid (FOLVITE) 1 MG tablet Take 1 tablet (1 mg total) by mouth daily. 90 tablet 1  ? thiamine (VITAMIN B-1) 50 MG tablet Take 1 tablet (50 mg total) by mouth daily. 90 tablet 1  ? tirzepatide (MOUNJARO) 5 MG/0.5ML Pen Inject 5 mg into the skin once a week. 6 mL 0  ? vitamin C (ASCORBIC ACID) 500 MG tablet Take 1,000 mg by mouth daily.    ? ?Facility-Administered Medications Prior to Visit  ?Medication Dose Route Frequency Provider Last Rate Last Admin  ? fentaNYL (SUBLIMAZE) injection    Anesthesia Intra-op Suzy Bouchard, CRNA   50 mcg at 01/23/19 1308  ? ? ?ROS ?Review of Systems  ?Constitutional:  Positive for fatigue. Negative for chills and diaphoresis.  ?Eyes:  Positive for visual disturbance (BV).  ?Respiratory:  Negative for cough, chest tightness, shortness of breath and wheezing.   ?Cardiovascular:  Negative for chest pain, palpitations and leg swelling.  ?Gastrointestinal:  Negative for abdominal pain, diarrhea, nausea and vomiting.  ?Endocrine: Positive for polydipsia, polyphagia and polyuria.  ?Genitourinary:  Negative for difficulty urinating and dysuria.  ?Musculoskeletal: Negative.  Negative for myalgias.  ?Skin: Negative.   ?Neurological:  Negative for dizziness, weakness, light-headedness and headaches.  ?Hematological:  Negative for adenopathy. Does not bruise/bleed easily.  ?Psychiatric/Behavioral:  Negative for confusion, decreased concentration, dysphoric mood, sleep disturbance and suicidal ideas.  The patient is nervous/anxious.   ? ?Objective:  ?BP 136/78 (BP Location: Right Arm, Patient Position: Sitting, Cuff Size: Large)   Pulse 91   Temp 97.9 ?F (36.6 ?C) (Oral)   Resp 16   Ht _0  (1.626 m)   Wt 222 lb (100.7 kg)   LMP 01/07/2022 (Approximate)   SpO2 94%   BMI 38.11 kg/m?  ? ?BP Readings from Last 3 Encounters:  ?01/09/22 136/78  ?12/31/21 (!) 136/97  ?10/02/21 138/68  ? ? ?Wt Readings from Last 3 Encounters:  ?01/09/22 222 lb (100.7 kg)  ?08/25/21 219 lb (99.3 kg)  ?07/31/21 225 lb 15.5 oz (102.5 kg)  ? ? ?Physical Exam ?Vitals reviewed.  ?Constitutional:   ?   Appearance: She is not ill-appearing.  ?HENT:  ?   Nose: Nose normal.  ?   Mouth/Throat:  ?   Mouth: Mucous membranes are moist.  ?Eyes:  ?   General: No scleral icterus. ?   Conjunctiva/sclera: Conjunctivae normal.  ?Cardiovascular:  ?   Rate and Rhythm: Normal rate and regular rhythm.  ?   Heart sounds: Normal heart sounds, S1 normal and S2 normal. No murmur heard. ?  No friction rub. No gallop.  ?   Comments: EKG- ?NSR, 86 bpm ?+LVH ?Old septal infarct ?Pulmonary:  ?   Effort: Pulmonary effort is normal.  ?   Breath sounds: No stridor. No wheezing, rhonchi or rales.  ?Abdominal:  ?   General: Abdomen is flat.  ?   Palpations: There is no mass.  ?   Tenderness: There is no abdominal tenderness. There is no guarding.  ?   Hernia: No hernia is present.  ?Musculoskeletal:     ?   General: No swelling.  ?   Cervical back: Neck supple.  ?   Right lower leg: No edema.  ?   Left lower leg: No edema.  ?Lymphadenopathy:  ?   Cervical: No cervical adenopathy.  ?Skin: ?   General: Skin is warm and dry.  ?Neurological:  ?   General: No focal deficit present.  ?   Mental Status: She is alert.  ?Psychiatric:     ?   Mood and Affect: Mood normal.     ?   Behavior: Behavior normal.  ? ? ?Lab Results  ?Component Value Date  ? WBC 9.4 01/09/2022  ? HGB 12.4 01/09/2022  ? HCT 37.6 01/09/2022  ? PLT 360.0 01/09/2022  ? GLUCOSE 98 01/09/2022  ? CHOL 151  04/14/2021  ? TRIG 217.0 (H) 04/14/2021  ? HDL 23.80 (L) 04/14/2021  ? LDLDIRECT 98.0 04/14/2021  ? [place] 97 11/10/2019  ? ALT 19 07/17/2021  ? AST 14 (L) 07/17/2021  ? NA 139 01/09/2022  ? K 3.8 01/09/2022  ? CL 105 01/09/2022  ? CREATININE 1.40 (H) 01/09/2022  ? BUN 17 01/09/2022  ? CO2 25 01/09/2022  ?  TSH 1.43 01/09/2022  ? INR 1.3 (H) 01/23/2019  ? HGBA1C 7.0 (H) 01/09/2022  ? MICROALBUR 26.5 (H) 01/09/2022  ? ? ?No results found. ? ?Assessment & Plan:  ? ?Susette was seen today for anemia, hypertension, diabetes and coronary artery disease. ? ?Diagnoses and all orders for this visit: ? ?Stage 3a chronic kidney disease ([place])- She is avoiding nephrotoxic agents. ?-     Urinalysis, Routine w reflex microscopic; Future ?-     Urinalysis, Routine w reflex microscopic ?-     dapagliflozin propanediol (FARXIGA) 10 MG TABS tablet; Take 1 tablet (10 mg total) by mouth daily before breakfast. ? ?GAD (generalized anxiety disorder) ?-     ALPRAZolam (XANAX) 0.5 MG tablet; Take 1 tablet (0.5 mg total) by mouth 2 (two) times daily as needed for anxiety. ? ?Diabetes 1.5, managed as type 2 ([place]) ?-     tirzepatide [facility]) 5 MG/0.5ML Pen; Inject 5 mg into the skin once a week. ? ?Iron deficiency anemia due to chronic blood loss ?-     IBC + Ferritin; Future ?-     CBC with Differential/Platelet; Future ?-     CBC with Differential/Platelet ?-     IBC + Ferritin ?-     Ferric Maltol (ACCRUFER) 30 MG CAPS; Take 1 capsule by mouth in the morning and at bedtime. ? ?Dietary folate deficiency anemia- I have asked her to restart the folate supplement. ?-     Vitamin B12; Future ?-     Folate; Future ?-     Folate ?-     Vitamin C48 ?-     folic acid (FOLVITE) 1 MG tablet; Take 1 tablet (1 mg total) by mouth daily. ? ?Need for vaccination ? ?Anemia due to acquired thiamine deficiency ?-     thiamine (VITAMIN B-1) 50 MG tablet; Take 1 tablet (50 mg total) by mouth daily. ? ?Insulin-requiring or dependent type II diabetes mellitus  ([place])- Her blood sugar is adequately well controlled. ?-     Basic metabolic panel; Future ?-     Hemoglobin A1c; Future ?-     Microalbumin / creatinine urine ratio; Future ?-     Microalbumin / creatinine u

## 2022-01-09 NOTE — Telephone Encounter (Signed)
Key: [id number] ?

## 2022-01-09 NOTE — Patient Instructions (Signed)

## 2022-01-12 ENCOUNTER — Encounter: Payer: Self-pay | Admitting: Adult Health

## 2022-01-12 ENCOUNTER — Ambulatory Visit (INDEPENDENT_AMBULATORY_CARE_PROVIDER_SITE_OTHER): Payer: BC Managed Care – PPO | Admitting: Adult Health

## 2022-01-12 ENCOUNTER — Other Ambulatory Visit (HOSPITAL_COMMUNITY)
Admission: RE | Admit: 2022-01-12 | Discharge: 2022-01-12 | Disposition: A | Discharge status: Home / Self Care | Payer: BC Managed Care – PPO | Source: Ambulatory Visit | Attending: Adult Health | Admitting: Adult Health

## 2022-01-12 ENCOUNTER — Other Ambulatory Visit: Payer: Self-pay | Admitting: Internal Medicine

## 2022-01-12 VITALS — BP 128/84 | HR 82 | Ht 64.0 in | Wt 224.0 lb

## 2022-01-12 DIAGNOSIS — Z124 Encounter for screening for malignant neoplasm of cervix: Secondary | ICD-10-CM | POA: Insufficient documentation

## 2022-01-12 DIAGNOSIS — D219 Benign neoplasm of connective and other soft tissue, unspecified: Secondary | ICD-10-CM | POA: Diagnosis not present

## 2022-01-12 DIAGNOSIS — E139 Other specified diabetes mellitus without complications: Secondary | ICD-10-CM

## 2022-01-12 DIAGNOSIS — N921 Excessive and frequent menstruation with irregular cycle: Secondary | ICD-10-CM | POA: Insufficient documentation

## 2022-01-12 MED ORDER — OZEMPIC (0.25 OR 0.5 MG/DOSE) 2 MG/1.5ML ~~LOC~~ SOPN: 0.5000 mg | PEN_INJECTOR | SUBCUTANEOUS | 0 refills | Status: DC

## 2022-01-12 MED ORDER — BD PEN NEEDLE NANO 2ND GEN 32G X 4 MM MISC: 1.0000 | Freq: Four times a day (QID) | 2 refills | Status: DC

## 2022-01-12 NOTE — Telephone Encounter (Signed)
Per CoverMyMeds: ? ?PA was denied.  ?

## 2022-01-12 NOTE — Addendum Note (Signed)
Addended by: Janece Canterbury on: 01/12/2022 02:56 PM ? ? Modules accepted: Orders ? ?

## 2022-01-12 NOTE — Progress Notes (Signed)
?  Subjective:  ?  ? Patient ID: Maureen Price, female   DOB: 1976-07-19, 46 y.o.   MRN: [id number] ? ?HPI ?Maureen Price is a 46 year old black female, divorced, [id number], in complaining of heavy longer periods. Has know fibroids, largest 4.9 cm on CT 07/17/21. Last hemoglobin was 12.4 on 01/09/22 and iron was 27, is on OTC iron now. A1c was 7.0 on 01/09/22.  She had MI with stent and CHF in the past and was on vent when got COVID.And she is still smoking.  ?She needs pap. ?She is working at [employer] ?PCP is Dr Ronnald Ramp. ? ?Review of Systems ?Had 8 day period in February and March and bleed 20 days in April ?No sex since September 2022 ?  Reviewed past medical,surgical, social and family history. Reviewed medications and allergies.  ?Objective:  ? Physical Exam ?BP 128/84 (BP Location: Right Arm, Patient Position: Sitting, Cuff Size: Normal)   Pulse 82   Ht '5\' 4"'$  (1.626 m)   Wt 224 lb (101.6 kg)   LMP 12/20/2021 (Approximate)   BMI 38.45 kg/m?   ?  Skin warm and dry.Pelvic: external genitalia is normal in appearance no lesions, vagina: pink and moist,urethra has no lesions or masses noted, cervix: bulbous, uterus: slightly enlarged, mildly tender, no masses felt, adnexa: no masses or tenderness noted. Bladder is non tender and no masses felt.  ?AA is 2 ?Fall risk is low ? ?  01/12/2022  ?  1:52 PM 04/14/2021  ?  2:54 PM 06/01/2017  ?  2:59 PM  ?Depression screen PHQ 2/9  ?Decreased Interest 0 0 1  ?Down, Depressed, Hopeless 0 1 1  ?PHQ - 2 Score 0 1 2  ?Altered sleeping 1  2  ?Tired, decreased energy 1  1  ?Change in appetite 1  0  ?Feeling bad or failure about yourself  0  0  ?Trouble concentrating 0  0  ?Moving slowly or fidgety/restless 0  0  ?Suicidal thoughts 0  0  ?PHQ-9 Score 3  5  ?  ? ?  01/12/2022  ?  1:52 PM 06/01/2017  ?  2:58 PM  ?GAD 7 : Generalized Anxiety Score  ?Nervous, Anxious, on Edge 0 1  ?Control/stop worrying 1 1  ?Worry too much - different things 1 1  ?Trouble relaxing 1 1  ?Restless 0 1  ?Easily annoyed  or irritable 1 0  ?Afraid - awful might happen 0 0  ?Total GAD 7 Score 4 5  ? ? [place] - 01/12/22 1351   ? ?  ? Pregnancy Intention Screening  ? Does the patient want to become pregnant in the next year? No   ? Does the patient's partner want to become pregnant in the next year? No   ? Would the patient like to discuss contraceptive options today? No   ?  ? Contraception Wrap Up  ? Current Method Female Sterilization   btl  ? End Method Female Sterilization   btl  ? Contraception Counseling Provided No   ? ?  ?  ? ?  ? Examination chaperoned by Celene Squibb LPN ? ?  ?Assessment:  ?   ?1. Menorrhagia with irregular cycle ?Discussed with Dr Elonda Husky and will try mirena IUD first ?Handout given for mirena ? ?2. Fibroids ? ?3. Routine Papanicolaou smear ?Pap sent ?Pap in 3 years if normal  ?   ?Plan:  ?   ?Return 01/26/22 for mirena IUD insertion with Dr Nelda Marseille  ?   ?

## 2022-01-13 ENCOUNTER — Other Ambulatory Visit: Payer: Self-pay | Admitting: Internal Medicine

## 2022-01-13 DIAGNOSIS — E785 Hyperlipidemia, unspecified: Secondary | ICD-10-CM

## 2022-01-13 MED ORDER — EZETIMIBE 10 MG PO TABS: 10.0000 mg | ORAL_TABLET | Freq: Every day | ORAL | 3 refills | Status: DC

## 2022-01-13 NOTE — Telephone Encounter (Signed)
Pt informed of the alternative being sent.  ? ?FYI- She stated that she received a message from her pharmacy stating the Nexilzet was $138 and that she could not afford that. Requesting alternative.  ?

## 2022-01-14 ENCOUNTER — Encounter: Payer: Self-pay | Admitting: Internal Medicine

## 2022-01-16 ENCOUNTER — Encounter: Payer: Self-pay | Admitting: Family Medicine

## 2022-01-16 ENCOUNTER — Ambulatory Visit (INDEPENDENT_AMBULATORY_CARE_PROVIDER_SITE_OTHER): Payer: BC Managed Care – PPO | Admitting: Family Medicine

## 2022-01-16 VITALS — BP 130/72 | HR 82 | Temp 97.6°F | Ht 64.0 in | Wt 223.0 lb

## 2022-01-16 DIAGNOSIS — R109 Unspecified abdominal pain: Secondary | ICD-10-CM

## 2022-01-16 LAB — POC URINALSYSI DIPSTICK (AUTOMATED)
Bilirubin: NEGATIVE
Blood, UA: NEGATIVE
Glucose, UA: POSITIVE — AB
Ketones, UA: NEGATIVE
Leukocytes, UA: NEGATIVE
Nitrite, UA: NEGATIVE
Protein, UA: POSITIVE — AB
Spec Grav, UA: 1.015 (ref 1.010–1.025)
Urobilinogen, UA: 0.2 E.U./dL
pH, UA: 6 (ref 5.0–8.0)

## 2022-01-16 LAB — CYTOLOGY - PAP
Chlamydia: NEGATIVE
Comment: NEGATIVE
Comment: NEGATIVE
Comment: NORMAL
Diagnosis: NEGATIVE
High risk HPV: NEGATIVE
Neisseria Gonorrhea: NEGATIVE

## 2022-01-16 NOTE — Progress Notes (Signed)
Subjective: ? Maureen Price is a 46 y.o. female who complains of possible urinary tract infection.  She has had symptoms for 3 days.  Symptoms include  left flank pain . Patient denies  fever, chills, abdominal pain, urinary urgency, frequency, dysuria, vaginal discharge, nausea, vomiting or diarrhea .  Last UTI was unknown.  Using "kidney cleanse" for current symptoms.  She also took "raspberry ketones".  States pain resolved completely yesterday. ? ?Hx of kidney stones.  ? ?Patient does not have a history of recurrent UTI. Patient does not have a history of pyelonephritis.  No other aggravating or relieving factors.  No other c/o. ? ?Past Medical History:  ?Diagnosis Date  ? Acute myocardial infarction of other anterior wall, initial episode of care   ? Anginal pain ([place])   ? Anxiety   ? Arthritis   ? Asthma   ? CAD (coronary artery disease)   ? CHF (congestive heart failure) ([place])   ? Depression   ? Diabetes mellitus   ? 2002; no meds since 2004  ? Dyslipidemia, goal LDL below 70 10/26/2013  ? Dysrhythmia   ? GERD (gastroesophageal reflux disease)   ? Headache(784.0)   ? Obesity   ? Paroxysmal ventricular tachycardia ([place])   ? Shortness of breath   ? STEMI (ST elevation myocardial infarction) ([place])   ? Tobacco abuse   ? ? ?ROS as in subjective ? ?Reviewed allergies, medications, past medical, surgical, and social history.  ? ? ?Objective: ?Vitals:  ? 01/16/22 0851  ?BP: 130/72  ?Pulse: 82  ?Temp: 97.6 ?F (36.4 ?C)  ?SpO2: 98%  ? ? ?General appearance: alert, no distress, WD/WN, female ?Abdomen: +bs, soft, non tender, non distended ?Back: no CVA tenderness ?GU: deferred  ?   ? ?Laboratory:  ?Urine dipstick: 3+ for glucose, negative for hemoglobin, negative for ketones, negative for leukocyte esterase, negative for nitrites, and pos for protein.   ?  ? ? ?Assessment: ?Flank pain - Plan: POCT Urinalysis Dipstick (Automated) ? ? ?Plan: ?Discussed symptoms, diagnosis, possible complications, and usual course of  illness. ? ?She is back to her baseline today.  Urinalysis dipstick does not show infection.  Glucose due to [place].  Continue good hydration. ? ?Urine culture was not sent.   ? ?Call or return if symptoms return. ?  ? ?

## 2022-01-26 ENCOUNTER — Ambulatory Visit: Payer: BC Managed Care – PPO | Admitting: Obstetrics & Gynecology

## 2022-02-26 ENCOUNTER — Other Ambulatory Visit: Payer: Self-pay | Admitting: Internal Medicine

## 2022-02-26 DIAGNOSIS — E139 Other specified diabetes mellitus without complications: Secondary | ICD-10-CM

## 2022-02-26 DIAGNOSIS — E119 Type 2 diabetes mellitus without complications: Secondary | ICD-10-CM

## 2022-02-26 MED ORDER — SEMAGLUTIDE (2 MG/DOSE) 8 MG/3ML ~~LOC~~ SOPN: 2.0000 mg | PEN_INJECTOR | SUBCUTANEOUS | 0 refills | Status: DC

## 2022-03-11 ENCOUNTER — Encounter: Payer: Self-pay | Admitting: Internal Medicine

## 2022-03-11 ENCOUNTER — Other Ambulatory Visit: Payer: Self-pay | Admitting: Internal Medicine

## 2022-03-11 DIAGNOSIS — E119 Type 2 diabetes mellitus without complications: Secondary | ICD-10-CM

## 2022-03-11 MED ORDER — SEMAGLUTIDE (2 MG/DOSE) 8 MG/3ML ~~LOC~~ SOPN: 2.0000 mg | PEN_INJECTOR | SUBCUTANEOUS | 0 refills | Status: DC

## 2022-03-13 ENCOUNTER — Other Ambulatory Visit: Payer: Self-pay | Admitting: Internal Medicine

## 2022-03-13 DIAGNOSIS — E1121 Type 2 diabetes mellitus with diabetic nephropathy: Secondary | ICD-10-CM

## 2022-03-13 DIAGNOSIS — N1831 Chronic kidney disease, stage 3a: Secondary | ICD-10-CM

## 2022-03-13 DIAGNOSIS — E139 Other specified diabetes mellitus without complications: Secondary | ICD-10-CM

## 2022-03-13 DIAGNOSIS — E119 Type 2 diabetes mellitus without complications: Secondary | ICD-10-CM

## 2022-03-14 ENCOUNTER — Other Ambulatory Visit: Payer: Self-pay | Admitting: Internal Medicine

## 2022-03-14 DIAGNOSIS — N1831 Chronic kidney disease, stage 3a: Secondary | ICD-10-CM

## 2022-03-14 DIAGNOSIS — E1121 Type 2 diabetes mellitus with diabetic nephropathy: Secondary | ICD-10-CM

## 2022-03-14 DIAGNOSIS — E119 Type 2 diabetes mellitus without complications: Secondary | ICD-10-CM

## 2022-03-14 MED ORDER — DAPAGLIFLOZIN PROPANEDIOL 10 MG PO TABS: 10.0000 mg | ORAL_TABLET | Freq: Every day | ORAL | 1 refills | Status: DC

## 2022-04-06 ENCOUNTER — Ambulatory Visit (HOSPITAL_COMMUNITY)
Admission: EM | Admit: 2022-04-06 | Discharge: 2022-04-06 | Disposition: A | Discharge status: Home / Self Care | Payer: BC Managed Care – PPO

## 2022-04-06 ENCOUNTER — Encounter (HOSPITAL_COMMUNITY): Payer: Self-pay | Admitting: Emergency Medicine

## 2022-04-06 ENCOUNTER — Other Ambulatory Visit: Payer: Self-pay

## 2022-04-06 DIAGNOSIS — H1131 Conjunctival hemorrhage, right eye: Secondary | ICD-10-CM

## 2022-04-06 NOTE — ED Triage Notes (Signed)
Right eye redness noticed Sunday morning.  No blurriness.  No drainage around eyelashes.

## 2022-04-06 NOTE — ED Provider Notes (Signed)
[facility]    CSN: [id number] Arrival date & time: 04/06/22  1110      History   Chief Complaint Chief Complaint  Patient presents with   Eye Problem    HPI Maureen Price is a 46 y.o. female.   Subjective:  Maureen Price is a 46 y.o. female who presents for evaluation of redness of right eye. She has noticed the above symptoms in the right eye for 1 day. Onset was acute upon waking up yesterday morning. Patient reports that she had vigorous sexual intercourse the night prior to symptoms onset. Patient denies blurred vision, discharge, erythema, foreign body sensation, itching, pain, photophobia, tearing, and visual field deficit. There is no history of allergies, contact lens use, exposure to chemicals, other family members with similar symptoms, or trauma.   The following portions of the patient's history were reviewed and updated as appropriate: allergies, current medications, past family history, past medical history, past social history, past surgical history, and problem list.       Past Medical History:  Diagnosis Date   Acute myocardial infarction of other anterior wall, initial episode of care    Anginal pain ([place])    Anxiety    Arthritis    Asthma    CAD (coronary artery disease)    CHF (congestive heart failure) ([place])    Depression    Diabetes mellitus    2002; no meds since 2004   Dyslipidemia, goal LDL below 70 10/26/2013   Dysrhythmia    GERD (gastroesophageal reflux disease)    Headache(784.0)    Obesity    Paroxysmal ventricular tachycardia (HCC)    Shortness of breath    STEMI (ST elevation myocardial infarction) ([place])    Tobacco abuse     Patient Active Problem List   Diagnosis Date Noted   Menorrhagia with irregular cycle 01/12/2022   Fibroids 01/12/2022   Routine Papanicolaou smear 01/12/2022   Insulin-requiring or dependent type II diabetes mellitus ([place]) 01/09/2022   Screen for colon cancer 01/09/2022   Stage 3a  chronic kidney disease ([place]) 08/27/2021   Diabetic nephropathy associated with type 2 diabetes mellitus ([place]) 08/27/2021   Need for vaccination 08/27/2021   Diabetes 1.5, managed as type 2 ([place]) 07/28/2021   Anemia due to acquired thiamine deficiency 04/21/2021   Uterine leiomyoma 04/14/2021   Dietary folate deficiency anemia 04/14/2021   Routine general medical examination at a health care facility 11/10/2019   Cervical cancer screening 11/10/2019   Iron deficiency anemia due to chronic blood loss 11/10/2019   Chronic diastolic (congestive) heart failure (HCC) 01/23/2019   Panic anxiety syndrome 01/05/2019   Long-term current use of opiate analgesic 02/28/2018   Microalbuminuria due to type 2 diabetes mellitus ([place]) 06/24/2017   Visit for screening mammogram 06/23/2017   Asthma 09/18/2016   DDD (degenerative disc disease), lumbar 01/02/2015   Atherosclerosis of native artery of extremity with intermittent claudication ([place]) 03/05/2014   Tobacco use disorder 03/05/2014   Coronary artery disease 11/06/2013   Dyslipidemia, goal LDL below 70 10/26/2013   Obesity    DJD (degenerative joint disease) of knee 07/07/2012    Past Surgical History:  Procedure Laterality Date   CESAREAN SECTION     CESAREAN SECTION  [phone]   CHOLECYSTECTOMY     CORONARY ANGIOPLASTY WITH STENT PLACEMENT     GALLBLADDER SURGERY     INCISION AND DRAINAGE PERIRECTAL ABSCESS N/A 01/23/2019   Procedure: IRRIGATION AND DEBRIDEMENT PERIRECTAL ABSCESS;  Surgeon: Rolm Bookbinder,  MD;  Location: [facility];  Service: General;  Laterality: N/A;   LEFT HEART CATH N/A 10/24/2013   Procedure: LEFT HEART CATH;  Surgeon: Lorretta Harp, MD;  Location: [facility] CATH LAB;  Service: Cardiovascular;  Laterality: N/A;   LEFT HEART CATHETERIZATION WITH CORONARY ANGIOGRAM N/A 10/26/2013   Procedure: LEFT HEART CATHETERIZATION WITH CORONARY ANGIOGRAM;  Surgeon: Burnell Blanks, MD;  Location: [facility] CATH LAB;  Service:  Cardiovascular;  Laterality: N/A;   LEFT HEART CATHETERIZATION WITH CORONARY ANGIOGRAM N/A 03/29/2014   Procedure: LEFT HEART CATHETERIZATION WITH CORONARY ANGIOGRAM;  Surgeon: Lorretta Harp, MD;  Location: [facility] CATH LAB;  Service: Cardiovascular;  Laterality: N/A;   TUBAL LIGATION     2000    OB History     Gravida  4   Para  3   Term  3   Preterm      AB  1   Living  3      SAB  1   IAB      Ectopic      Multiple      Live Births               Home Medications    Prior to Admission medications   Medication Sig Start Date End Date Taking? Authorizing Provider  ALPRAZolam Duanne Moron) 0.5 MG tablet Take 1 tablet (0.5 mg total) by mouth 2 (two) times daily as needed for anxiety. 01/09/22   [name], MD  aspirin EC 81 MG tablet Take 1 tablet (81 mg total) by mouth daily. 01/09/22   [name], MD  atorvastatin (LIPITOR) 20 MG tablet Take 1 tablet (20 mg total) by mouth daily. 01/09/22   [name], MD  dapagliflozin propanediol (FARXIGA) 10 MG TABS tablet Take 1 tablet (10 mg total) by mouth daily before breakfast. 03/14/22   [name], MD  ezetimibe (ZETIA) 10 MG tablet Take 1 tablet (10 mg total) by mouth daily. 01/13/22   [name], MD  Ferric Maltol (ACCRUFER) 30 MG CAPS Take 1 capsule by mouth in the morning and at bedtime. 01/09/22   [name], MD  Fluticasone-Umeclidin-Vilant (TRELEGY ELLIPTA) 100-62.5-25 MCG/INH AEPB Inhale 1 puff into the lungs daily. 07/16/20   [name], MD  folic acid (FOLVITE) 1 MG tablet Take 1 tablet (1 mg total) by mouth daily. 01/09/22   [name], MD  insulin aspart (NOVOLOG FLEXPEN) 100 UNIT/ML FlexPen Inject 15 Units into the skin 3 (three) times daily with meals. 08/27/21   [name], MD  Insulin Glargine [facility] KWIKPEN) 100 UNIT/ML Inject 70 Units into the skin daily. 08/27/21   [name], MD  Insulin Pen Needle (BD PEN NEEDLE NANO 2ND GEN) 32G X 4 MM MISC Inject 1 Act into the skin 4  (four) times daily. Use to inject insulin once per day. DX E11.8 01/12/22   [name], MD  Multiple Vitamin (MULTI-VITAMIN DAILY PO) Take 1 tablet by mouth daily.    [provider]  nitroGLYCERIN (NITROSTAT) 0.4 MG SL tablet Place 1 tablet (0.4 mg total) under the tongue every 5 (five) minutes as needed for chest pain (Hold for SBP <100). 08/14/20   Lorretta Harp, MD  Oxycodone HCl 10 MG TABS Take 10 mg by mouth every 8 (eight) hours as needed. 03/01/21   [provider]  Semaglutide, 2 MG/DOSE, 8 MG/3ML SOPN Inject 2 mg as directed once a week. 03/11/22  [name], MD  thiamine (VITAMIN B-1) 50 MG tablet Take 1 tablet (50 mg total) by mouth daily. 01/09/22   [name], MD  vitamin B-12 (CYANOCOBALAMIN) 1000 MCG tablet Take 2,000 mcg by mouth daily.    [provider]    Family History Family History  Problem Relation Age of Onset   Arthritis Mother    Diabetes Mother    Heart disease Father    Stroke Father    Diabetes Maternal Grandfather    Cancer Neg Hx    Alcohol abuse Neg Hx    Early death Neg Hx    Kidney disease Neg Hx    Hypertension Neg Hx    Hyperlipidemia Neg Hx    Thyroid disease Neg Hx     Social History Social History   Tobacco Use   Smoking status: Every Day    Packs/day: 0.50    Years: 20.00    Total pack years: 10.00    Types: Cigarettes   Smokeless tobacco: Never   Tobacco comments:    8-10 cigs/day  Vaping Use   Vaping Use: Some days  Substance Use Topics   Alcohol use: Yes    Comment: occasional   Drug use: No     Allergies   Erythromycin, Penicillins, Metformin and related, Viibryd [vilazodone hcl], and Bee venom   Review of Systems Review of Systems  Eyes:  Positive for redness. Negative for photophobia, pain, discharge, itching and visual disturbance.  All other systems reviewed and are negative.    Physical Exam Triage Vital Signs ED Triage Vitals  Enc Vitals Group     BP 04/06/22 1139  131/79     Pulse Rate 04/06/22 1139 87     Resp 04/06/22 1139 18     Temp 04/06/22 1139 98.2 F (36.8 C)     Temp Source 04/06/22 1139 Oral     SpO2 04/06/22 1139 98 %     Weight --      Height --      Head Circumference --      Peak Flow --      Pain Score 04/06/22 1136 0     Pain Loc --      Pain Edu? --      Excl. in [place]? --    No data found.  Updated Vital Signs BP 131/79 (BP Location: Left Arm)   Pulse 87   Temp 98.2 F (36.8 C) (Oral)   Resp 18   LMP 03/27/2022 (Approximate)   SpO2 98%   Visual Acuity Right Eye Distance:   Left Eye Distance:   Bilateral Distance:    Right Eye Near:   Left Eye Near:    Bilateral Near:     Physical Exam Vitals reviewed.  Constitutional:      Appearance: Normal appearance.  HENT:     Head: Normocephalic.  Eyes:     General: Lids are normal. Vision grossly intact. Gaze aligned appropriately. No visual field deficit.       Right eye: No foreign body, discharge or hordeolum.     Extraocular Movements: Extraocular movements intact.     Conjunctiva/sclera:     Right eye: Hemorrhage present.  Cardiovascular:     Rate and Rhythm: Normal rate.  Pulmonary:     Effort: Pulmonary effort is normal.  Musculoskeletal:        General: Normal range of motion.     Cervical back: Normal range of motion and neck supple.  Skin:    General: Skin is warm and dry.  Neurological:     General: No focal deficit present.     Mental Status: She is alert and oriented to person, place, and time.      UC Treatments / Results  Labs (all labs ordered are listed, but only abnormal results are displayed) Labs Reviewed - No data to display  EKG   Radiology No results found.  Procedures Procedures (including critical care time)  Medications Ordered in UC Medications - No data to display  Initial Impression / Assessment and Plan / UC Course  I have reviewed the triage vital signs and the nursing notes.  Pertinent labs & imaging results  that were available during my care of the patient were reviewed by me and considered in my medical decision making (see chart for details).    46 yo female presenting with acute subconjunctival hemorrhage of right eye likely due to straining during sexual intercourse. She denies any blurred vision, discharge, erythema, foreign body sensation, itching, pain, photophobia, tearing, and visual field deficit. Provided patient reassurance. Symptoms should resolve on its own in about 2-4 weeks. Discussed indications that would warrant opthalmology follow-up   Today's evaluation has revealed no signs of a dangerous process. Discussed diagnosis with patient and/or guardian. Patient and/or guardian aware of their diagnosis, possible red flag symptoms to watch out for and need for close follow up. Patient and/or guardian understands verbal and written discharge instructions. Patient and/or guardian comfortable with plan and disposition.  Patient and/or guardian has a clear mental status at this time, good insight into illness (after discussion and teaching) and has clear judgment to make decisions regarding their care  Documentation was completed with the aid of voice recognition software. Transcription may contain typographical errors. Final Clinical Impressions(s) / UC Diagnoses   Final diagnoses:  Subconjunctival hemorrhage of right eye     Discharge Instructions      Subconjunctival hemorrhage is bleeding that happens between the white part of your eye (sclera) and the clear membrane that covers the outside of your eye (conjunctiva). Subconjunctival hemorrhages usually do not require treatment, and they usually disappear on their own within two to four weeks.  Follow-up with eye doctor if it doesn't clear up after 4 weeks   Seek emergent care if :  Your vision changes, you have difficulty seeing, or you develop double vision. You suddenly develop severe sensitivity to light. You develop a severe  headache, persistent vomiting, confusion, or abnormal tiredness  Your eye seems to bulge or protrude from your eye socket. You develop unexplained bruises on your body. You have unexplained bleeding in another area of your body.     ED Prescriptions   None    PDMP not reviewed this encounter.   Enrique Sack, [place] 04/06/22 1152

## 2022-04-06 NOTE — Discharge Instructions (Addendum)
Subconjunctival hemorrhage is bleeding that happens between the white part of your eye (sclera) and the clear membrane that covers the outside of your eye (conjunctiva). Subconjunctival hemorrhages usually do not require treatment, and they usually disappear on their own within two to four weeks.  Follow-up with eye doctor if it doesn't clear up after 4 weeks   Seek emergent care if :  Your vision changes, you have difficulty seeing, or you develop double vision. You suddenly develop severe sensitivity to light. You develop a severe headache, persistent vomiting, confusion, or abnormal tiredness  Your eye seems to bulge or protrude from your eye socket. You develop unexplained bruises on your body. You have unexplained bleeding in another area of your body.

## 2022-05-25 ENCOUNTER — Encounter: Payer: Self-pay | Admitting: Internal Medicine

## 2022-05-28 ENCOUNTER — Ambulatory Visit: Payer: BC Managed Care – PPO | Admitting: Internal Medicine

## 2022-06-11 ENCOUNTER — Ambulatory Visit (INDEPENDENT_AMBULATORY_CARE_PROVIDER_SITE_OTHER): Payer: BC Managed Care – PPO | Admitting: Internal Medicine

## 2022-06-11 ENCOUNTER — Encounter: Payer: Self-pay | Admitting: Internal Medicine

## 2022-06-11 VITALS — BP 128/82 | HR 86 | Temp 97.9°F | Ht 64.0 in | Wt 219.0 lb

## 2022-06-11 DIAGNOSIS — D520 Dietary folate deficiency anemia: Secondary | ICD-10-CM

## 2022-06-11 DIAGNOSIS — N1831 Chronic kidney disease, stage 3a: Secondary | ICD-10-CM

## 2022-06-11 DIAGNOSIS — E1121 Type 2 diabetes mellitus with diabetic nephropathy: Secondary | ICD-10-CM | POA: Diagnosis not present

## 2022-06-11 DIAGNOSIS — E139 Other specified diabetes mellitus without complications: Secondary | ICD-10-CM

## 2022-06-11 DIAGNOSIS — E785 Hyperlipidemia, unspecified: Secondary | ICD-10-CM

## 2022-06-11 DIAGNOSIS — D538 Other specified nutritional anemias: Secondary | ICD-10-CM

## 2022-06-11 DIAGNOSIS — D500 Iron deficiency anemia secondary to blood loss (chronic): Secondary | ICD-10-CM | POA: Diagnosis not present

## 2022-06-11 DIAGNOSIS — E519 Thiamine deficiency, unspecified: Secondary | ICD-10-CM

## 2022-06-11 DIAGNOSIS — I5032 Chronic diastolic (congestive) heart failure: Secondary | ICD-10-CM

## 2022-06-11 DIAGNOSIS — Z0001 Encounter for general adult medical examination with abnormal findings: Secondary | ICD-10-CM | POA: Diagnosis not present

## 2022-06-11 DIAGNOSIS — I2510 Atherosclerotic heart disease of native coronary artery without angina pectoris: Secondary | ICD-10-CM

## 2022-06-11 DIAGNOSIS — E611 Iron deficiency: Secondary | ICD-10-CM | POA: Insufficient documentation

## 2022-06-11 DIAGNOSIS — I10 Essential (primary) hypertension: Secondary | ICD-10-CM | POA: Diagnosis not present

## 2022-06-11 DIAGNOSIS — Z1231 Encounter for screening mammogram for malignant neoplasm of breast: Secondary | ICD-10-CM

## 2022-06-11 DIAGNOSIS — Z1211 Encounter for screening for malignant neoplasm of colon: Secondary | ICD-10-CM

## 2022-06-11 LAB — BASIC METABOLIC PANEL
BUN: 19 mg/dL (ref 6–23)
CO2: 23 mEq/L (ref 19–32)
Calcium: 9.1 mg/dL (ref 8.4–10.5)
Chloride: 106 mEq/L (ref 96–112)
Creatinine, Ser: 1.4 mg/dL — ABNORMAL HIGH (ref 0.40–1.20)
GFR: 45.25 mL/min — ABNORMAL LOW (ref >60.00)
Glucose, Bld: 155 mg/dL — ABNORMAL HIGH (ref 70–99)
Potassium: 3.7 mEq/L (ref 3.5–5.1)
Sodium: 137 mEq/L (ref 135–145)

## 2022-06-11 LAB — CBC WITH DIFFERENTIAL/PLATELET
Basophils Absolute: 0.1 10*3/uL (ref 0.0–0.1)
Basophils Relative: 1.1 % (ref 0.0–3.0)
Eosinophils Absolute: 0.2 10*3/uL (ref 0.0–0.7)
Eosinophils Relative: 2.2 % (ref 0.0–5.0)
HCT: 36.6 % (ref 36.0–46.0)
Hemoglobin: 12.5 g/dL (ref 12.0–15.0)
Lymphocytes Relative: 27.2 % (ref 12.0–46.0)
Lymphs Abs: 2.6 10*3/uL (ref 0.7–4.0)
MCHC: 34.2 g/dL (ref 30.0–36.0)
MCV: 80.4 fl (ref 78.0–100.0)
Monocytes Absolute: 0.7 10*3/uL (ref 0.1–1.0)
Monocytes Relative: 6.8 % (ref 3.0–12.0)
Neutro Abs: 6 10*3/uL (ref 1.4–7.7)
Neutrophils Relative %: 62.7 % (ref 43.0–77.0)
Platelets: 353 10*3/uL (ref 150.0–400.0)
RBC: 4.56 Mil/uL (ref 3.87–5.11)
RDW: 15.4 % (ref 11.5–15.5)
WBC: 9.6 10*3/uL (ref 4.0–10.5)

## 2022-06-11 LAB — HEMOGLOBIN A1C: Hgb A1c MFr Bld: 7.2 % — ABNORMAL HIGH (ref 4.6–6.5)

## 2022-06-11 LAB — HEPATIC FUNCTION PANEL
ALT: 17 U/L (ref 0–35)
AST: 13 U/L (ref 0–37)
Albumin: 4.1 g/dL (ref 3.5–5.2)
Alkaline Phosphatase: 95 U/L (ref 39–117)
Bilirubin, Direct: 0 mg/dL (ref 0.0–0.3)
Total Bilirubin: 0.2 mg/dL (ref 0.2–1.2)
Total Protein: 7.6 g/dL (ref 6.0–8.3)

## 2022-06-11 LAB — LIPID PANEL
Cholesterol: 149 mg/dL (ref 0–200)
HDL: 30.1 mg/dL — ABNORMAL LOW (ref >39.00)
LDL Cholesterol: 100 mg/dL — ABNORMAL HIGH (ref 0–99)
NonHDL: 118.67
Total CHOL/HDL Ratio: 5
Triglycerides: 92 mg/dL (ref 0.0–149.0)
VLDL: 18.4 mg/dL (ref 0.0–40.0)

## 2022-06-11 LAB — IBC + FERRITIN
Ferritin: 18.2 ng/mL (ref 10.0–291.0)
Iron: 32 ug/dL — ABNORMAL LOW (ref 42–145)
Saturation Ratios: 10.9 % — ABNORMAL LOW (ref 20.0–50.0)
TIBC: 292.6 ug/dL (ref 250.0–450.0)
Transferrin: 209 mg/dL — ABNORMAL LOW (ref 212.0–360.0)

## 2022-06-11 LAB — URINALYSIS, ROUTINE W REFLEX MICROSCOPIC
Bilirubin Urine: NEGATIVE
Hgb urine dipstick: NEGATIVE
Ketones, ur: NEGATIVE
Leukocytes,Ua: NEGATIVE
Nitrite: NEGATIVE
RBC / HPF: NONE SEEN (ref 0–?)
Specific Gravity, Urine: 1.01 (ref 1.000–1.030)
Total Protein, Urine: 30 — AB
Urine Glucose: >=1000 — AB
Urobilinogen, UA: 0.2 (ref 0.0–1.0)
pH: 5.5 (ref 5.0–8.0)

## 2022-06-11 LAB — TSH: TSH: 1.16 u[IU]/mL (ref 0.35–5.50)

## 2022-06-11 MED ORDER — DEXCOM G7 RECEIVER DEVI: 1.0000 | Freq: Every day | 1 refills | Status: DC

## 2022-06-11 MED ORDER — TIRZEPATIDE 2.5 MG/0.5ML ~~LOC~~ SOAJ: 2.5000 mg | SUBCUTANEOUS | 0 refills | Status: DC

## 2022-06-11 MED ORDER — ACCRUFER 30 MG PO CAPS: 1.0000 | ORAL_CAPSULE | Freq: Two times a day (BID) | ORAL | 1 refills | Status: DC

## 2022-06-11 MED ORDER — DEXCOM G7 SENSOR MISC: 1.0000 | Freq: Every day | 1 refills | Status: DC

## 2022-06-11 NOTE — Progress Notes (Signed)
Subjective:  Patient ID: Maureen Price, female    DOB: 1975/09/24  Age: 46 y.o. MRN: [id number]  CC: Annual Exam, Anemia, Hypertension, Hyperlipidemia, and Diabetes   HPI Maureen Price presents for a CPX and f/up -  She has felt recently and denies DOE, CP, SOB, or edema.  Outpatient Medications Prior to Visit  Medication Sig Dispense Refill   ALPRAZolam (XANAX) 0.5 MG tablet Take 1 tablet (0.5 mg total) by mouth 2 (two) times daily as needed for anxiety. 60 tablet 3   aspirin EC 81 MG tablet Take 1 tablet (81 mg total) by mouth daily. 90 tablet 1   atorvastatin (LIPITOR) 20 MG tablet Take 1 tablet (20 mg total) by mouth daily. 90 tablet 1   dapagliflozin propanediol (FARXIGA) 10 MG TABS tablet Take 1 tablet (10 mg total) by mouth daily before breakfast. 90 tablet 1   ezetimibe (ZETIA) 10 MG tablet Take 1 tablet (10 mg total) by mouth daily. 90 tablet 3   Fluticasone-Umeclidin-Vilant (TRELEGY ELLIPTA) 100-62.5-25 MCG/INH AEPB Inhale 1 puff into the lungs daily. 90 each 1   folic acid (FOLVITE) 1 MG tablet Take 1 tablet (1 mg total) by mouth daily. 90 tablet 1   insulin aspart (NOVOLOG FLEXPEN) 100 UNIT/ML FlexPen Inject 15 Units into the skin 3 (three) times daily with meals. 42 mL 1   Insulin Glargine (BASAGLAR KWIKPEN) 100 UNIT/ML Inject 70 Units into the skin daily. 63 mL 1   Insulin Pen Needle (BD PEN NEEDLE NANO 2ND GEN) 32G X 4 MM MISC Inject 1 Act into the skin 4 (four) times daily. Use to inject insulin once per day. DX E11.8 200 each 2   Multiple Vitamin (MULTI-VITAMIN DAILY PO) Take 1 tablet by mouth daily.     nitroGLYCERIN (NITROSTAT) 0.4 MG SL tablet Place 1 tablet (0.4 mg total) under the tongue every 5 (five) minutes as needed for chest pain (Hold for SBP <100). 30 tablet 2   Oxycodone HCl 10 MG TABS Take 10 mg by mouth every 8 (eight) hours as needed.     thiamine (VITAMIN B-1) 50 MG tablet Take 1 tablet (50 mg total) by mouth daily. 90 tablet 1   vitamin  B-12 (CYANOCOBALAMIN) 1000 MCG tablet Take 2,000 mcg by mouth daily.     Ferric Maltol (ACCRUFER) 30 MG CAPS Take 1 capsule by mouth in the morning and at bedtime. 180 capsule 1   Semaglutide, 2 MG/DOSE, 8 MG/3ML SOPN Inject 2 mg as directed once a week. 28.92 mL 0   Facility-Administered Medications Prior to Visit  Medication Dose Route Frequency Provider Last Rate Last Admin   fentaNYL (SUBLIMAZE) injection    Anesthesia Intra-op Suzy Bouchard, CRNA   50 mcg at 01/23/19 1308    ROS Review of Systems  Constitutional: Negative.  Negative for diaphoresis, fatigue and unexpected weight change.  HENT: Negative.    Eyes: Negative.   Respiratory:  Negative for cough, chest tightness, shortness of breath and wheezing.   Cardiovascular:  Negative for chest pain, palpitations and leg swelling.  Gastrointestinal:  Negative for abdominal pain, constipation, diarrhea and vomiting.  Endocrine: Negative.   Genitourinary: Negative.  Negative for difficulty urinating.  Musculoskeletal: Negative.  Negative for arthralgias and myalgias.  Skin: Negative.   Neurological:  Negative for dizziness and weakness.  Hematological:  Negative for adenopathy. Does not bruise/bleed easily.  Psychiatric/Behavioral: Negative.      Objective:  BP 128/82 (BP Location: Left Arm, Patient Position: Sitting, Cuff  Size: Large)   Pulse 86   Temp 97.9 F (36.6 C) (Oral)   Ht '5\' 4"'$  (1.626 m)   Wt 219 lb (99.3 kg)   SpO2 98%   BMI 37.59 kg/m   BP Readings from Last 3 Encounters:  06/11/22 128/82  04/06/22 131/79  01/16/22 130/72    Wt Readings from Last 3 Encounters:  06/11/22 219 lb (99.3 kg)  01/16/22 223 lb (101.2 kg)  01/12/22 224 lb (101.6 kg)    Physical Exam Constitutional:      Appearance: She is not ill-appearing.  HENT:     Mouth/Throat:     Mouth: Mucous membranes are moist.  Eyes:     General: No scleral icterus.    Conjunctiva/sclera: Conjunctivae normal.  Cardiovascular:     Rate and  Rhythm: Normal rate and regular rhythm.     Heart sounds: No murmur heard. Pulmonary:     Effort: Pulmonary effort is normal.     Breath sounds: No stridor. No wheezing, rhonchi or rales.  Abdominal:     General: Abdomen is flat.     Palpations: There is no mass.     Tenderness: There is no abdominal tenderness. There is no guarding.     Hernia: No hernia is present.  Musculoskeletal:        General: Normal range of motion.     Cervical back: Neck supple.     Right lower leg: No edema.     Left lower leg: No edema.  Lymphadenopathy:     Cervical: No cervical adenopathy.  Skin:    General: Skin is warm and dry.  Neurological:     General: No focal deficit present.     Mental Status: She is alert.  Psychiatric:        Mood and Affect: Mood normal.        Behavior: Behavior normal.     Lab Results  Component Value Date   WBC 9.6 06/11/2022   HGB 12.5 06/11/2022   HCT 36.6 06/11/2022   PLT 353.0 06/11/2022   GLUCOSE 155 (H) 06/11/2022   CHOL 149 06/11/2022   TRIG 92.0 06/11/2022   HDL 30.10 (L) 06/11/2022   LDLDIRECT 98.0 04/14/2021   LDLCALC 100 (H) 06/11/2022   ALT 17 06/11/2022   AST 13 06/11/2022   NA 137 06/11/2022   K 3.7 06/11/2022   CL 106 06/11/2022   CREATININE 1.40 (H) 06/11/2022   BUN 19 06/11/2022   CO2 23 06/11/2022   TSH 1.16 06/11/2022   INR 1.3 (H) 01/23/2019   HGBA1C 7.2 (H) 06/11/2022   MICROALBUR 26.5 (H) 01/09/2022    No results found.  Assessment & Plan:   Maureen Price was seen today for annual exam, anemia, hypertension, hyperlipidemia and diabetes.  Diagnoses and all orders for this visit:  Chronic diastolic (congestive) heart failure ([place])- She has a normal volume status. -     Ambulatory referral to Cardiology  Diabetes 1.5, managed as type 2 ([place])- Her blood sugar is not adequately well controlled. Will add a GLP/GIP agonist -     Hemoglobin A1c; Future -     Ambulatory referral to Ophthalmology -     Discontinue: tirzepatide  [facility]) 2.5 MG/0.5ML Pen; Inject 2.5 mg into the skin once a week. -     tirzepatide [facility]) 2.5 MG/0.5ML Pen; Inject 2.5 mg into the skin once a week. -     Hemoglobin A1c -     Continuous Blood Gluc Receiver (DEXCOM G7  RECEIVER) DEVI; 1 Act by Does not apply route daily. -     Continuous Blood Gluc Sensor (DEXCOM G7 SENSOR) MISC; 1 Act by Does not apply route daily.  Stage 3a chronic kidney disease ([place])- Her renal function is stable. -     Basic metabolic panel; Future -     Urinalysis, Routine w reflex microscopic; Future -     Basic metabolic panel -     Urinalysis, Routine w reflex microscopic  Dyslipidemia, goal LDL below 70- LDL goal achieved. Doing well on the statin  -     Lipid panel; Future -     Hepatic function panel; Future -     TSH; Future -     TSH -     Lipid panel -     Hepatic function panel  Hypertension, unspecified type -     TSH; Future -     TSH  Dietary folate deficiency anemia -     CBC with Differential/Platelet; Future -     CBC with Differential/Platelet  Iron deficiency anemia due to chronic blood loss -     CBC with Differential/Platelet; Future -     IBC + Ferritin; Future -     IBC + Ferritin -     CBC with Differential/Platelet -     Ferric Maltol (ACCRUFER) 30 MG CAPS; Take 1 capsule by mouth in the morning and at bedtime.  Anemia due to acquired thiamine deficiency -     CBC with Differential/Platelet; Future -     CBC with Differential/Platelet  Coronary artery disease involving native coronary artery of native heart without angina pectoris -     Ambulatory referral to Cardiology  Screen for colon cancer -     Ambulatory referral to Gastroenterology  Visit for screening mammogram -     MM DIGITAL SCREENING BILATERAL; Future  Encounter for general adult medical examination with abnormal findings- Exam completed, labs reviewed, vaccines reviewed-she refused a flu vaccine, cancer screenings addressed, patient education was  given.  Iron deficiency -     Ferric Maltol (ACCRUFER) 30 MG CAPS; Take 1 capsule by mouth in the morning and at bedtime.  Diabetic nephropathy associated with type 2 diabetes mellitus ([place])   I have discontinued Maureen Price's Semaglutide (2 MG/DOSE). I am also having her start on Dexcom G7 Receiver and Dexcom G7 Sensor. Additionally, I am having her maintain her cyanocobalamin, Multiple Vitamin (MULTI-VITAMIN DAILY PO), Trelegy Ellipta, nitroGLYCERIN, Oxycodone HCl, Basaglar KwikPen, NovoLOG FlexPen, thiamine, atorvastatin, aspirin EC, folic acid, ALPRAZolam, BD Pen Needle Nano 2nd Gen, ezetimibe, dapagliflozin propanediol, tirzepatide, and ACCRUFeR.  Meds ordered this encounter  Medications   DISCONTD: tirzepatide (MOUNJARO) 2.5 MG/0.5ML Pen    Sig: Inject 2.5 mg into the skin once a week.    Dispense:  2 mL    Refill:  0   tirzepatide (MOUNJARO) 2.5 MG/0.5ML Pen    Sig: Inject 2.5 mg into the skin once a week.    Dispense:  2 mL    Refill:  0   Continuous Blood Gluc Receiver (DEXCOM G7 RECEIVER) DEVI    Sig: 1 Act by Does not apply route daily.    Dispense:  9 each    Refill:  1   Continuous Blood Gluc Sensor (DEXCOM G7 SENSOR) MISC    Sig: 1 Act by Does not apply route daily.    Dispense:  9 each    Refill:  1   Ferric Maltol (ACCRUFER) 30  MG CAPS    Sig: Take 1 capsule by mouth in the morning and at bedtime.    Dispense:  180 capsule    Refill:  1     Follow-up: Return in about 6 months (around 12/11/2022).  [name], MD

## 2022-06-11 NOTE — Patient Instructions (Signed)

## 2022-08-01 ENCOUNTER — Encounter: Payer: Self-pay | Admitting: Internal Medicine

## 2022-08-04 ENCOUNTER — Encounter: Payer: Self-pay | Admitting: Internal Medicine

## 2022-08-18 ENCOUNTER — Ambulatory Visit
Admission: RE | Admit: 2022-08-18 | Discharge: 2022-08-18 | Disposition: A | Discharge status: Home / Self Care | Payer: BC Managed Care – PPO | Source: Ambulatory Visit | Attending: Internal Medicine | Admitting: Internal Medicine

## 2022-08-18 DIAGNOSIS — Z1231 Encounter for screening mammogram for malignant neoplasm of breast: Secondary | ICD-10-CM

## 2022-09-25 LAB — HM DIABETES EYE EXAM

## 2022-10-20 ENCOUNTER — Other Ambulatory Visit: Payer: Self-pay | Admitting: Internal Medicine

## 2022-10-20 DIAGNOSIS — E139 Other specified diabetes mellitus without complications: Secondary | ICD-10-CM

## 2022-10-20 MED ORDER — TIRZEPATIDE 2.5 MG/0.5ML ~~LOC~~ SOAJ: 2.5000 mg | SUBCUTANEOUS | 0 refills | Status: DC

## 2022-11-18 ENCOUNTER — Telehealth: Payer: Self-pay | Admitting: Internal Medicine

## 2022-11-18 ENCOUNTER — Other Ambulatory Visit: Payer: Self-pay | Admitting: Internal Medicine

## 2022-11-18 NOTE — Telephone Encounter (Signed)
Appt scheduled 3/18

## 2022-11-18 NOTE — Progress Notes (Unsigned)
Lab Results  Component Value Date   WBC 9.6 06/11/2022   HGB 12.5 06/11/2022   HCT 36.6 06/11/2022   PLT 353.0 06/11/2022   GLUCOSE 155 (H) 06/11/2022   CHOL 149 06/11/2022   TRIG 92.0 06/11/2022   HDL 30.10 (L) 06/11/2022   LDLDIRECT 98.0 04/14/2021   LDLCALC 100 (H) 06/11/2022   ALT 17 06/11/2022   AST 13 06/11/2022   NA 137 06/11/2022   K 3.7 06/11/2022   CL 106 06/11/2022   CREATININE 1.40 (H) 06/11/2022   BUN 19 06/11/2022   CO2 23 06/11/2022   TSH 1.16 06/11/2022   INR 1.3 (H) 01/23/2019   HGBA1C 7.2 (H) 06/11/2022   MICROALBUR 26.5 (H) 01/09/2022

## 2022-11-18 NOTE — Telephone Encounter (Signed)
Prescription Request  11/18/2022  LOV: 06/11/2022  What is the name of the medication or equipment? dapagliflozin propanediol (FARXIGA) 10 MG TABS tablet   Have you contacted your pharmacy to request a refill? Yes   Which pharmacy would you like this sent to?   [id number] - [address] [place] [place] [address] Phone: [phone] Fax: [phone]   Patient notified that their request is being sent to the clinical staff for review and that they should receive a response within 2 business days.   Please advise at Mobile [phone] (mobile)

## 2022-11-23 ENCOUNTER — Encounter: Payer: Self-pay | Admitting: Internal Medicine

## 2022-11-23 ENCOUNTER — Ambulatory Visit (INDEPENDENT_AMBULATORY_CARE_PROVIDER_SITE_OTHER): Payer: BC Managed Care – PPO | Admitting: Internal Medicine

## 2022-11-23 VITALS — BP 132/78 | HR 89 | Temp 98.5°F | Ht 64.0 in | Wt 205.0 lb

## 2022-11-23 DIAGNOSIS — E519 Thiamine deficiency, unspecified: Secondary | ICD-10-CM | POA: Diagnosis not present

## 2022-11-23 DIAGNOSIS — F17200 Nicotine dependence, unspecified, uncomplicated: Secondary | ICD-10-CM

## 2022-11-23 DIAGNOSIS — E1129 Type 2 diabetes mellitus with other diabetic kidney complication: Secondary | ICD-10-CM

## 2022-11-23 DIAGNOSIS — D500 Iron deficiency anemia secondary to blood loss (chronic): Secondary | ICD-10-CM | POA: Diagnosis not present

## 2022-11-23 DIAGNOSIS — Z794 Long term (current) use of insulin: Secondary | ICD-10-CM | POA: Diagnosis not present

## 2022-11-23 DIAGNOSIS — R809 Proteinuria, unspecified: Secondary | ICD-10-CM | POA: Diagnosis not present

## 2022-11-23 DIAGNOSIS — E785 Hyperlipidemia, unspecified: Secondary | ICD-10-CM

## 2022-11-23 DIAGNOSIS — E1121 Type 2 diabetes mellitus with diabetic nephropathy: Secondary | ICD-10-CM

## 2022-11-23 DIAGNOSIS — I2510 Atherosclerotic heart disease of native coronary artery without angina pectoris: Secondary | ICD-10-CM

## 2022-11-23 DIAGNOSIS — N1831 Chronic kidney disease, stage 3a: Secondary | ICD-10-CM

## 2022-11-23 DIAGNOSIS — D538 Other specified nutritional anemias: Secondary | ICD-10-CM

## 2022-11-23 DIAGNOSIS — E119 Type 2 diabetes mellitus without complications: Secondary | ICD-10-CM

## 2022-11-23 DIAGNOSIS — Z1211 Encounter for screening for malignant neoplasm of colon: Secondary | ICD-10-CM

## 2022-11-23 DIAGNOSIS — E139 Other specified diabetes mellitus without complications: Secondary | ICD-10-CM

## 2022-11-23 LAB — CBC WITH DIFFERENTIAL/PLATELET
Basophils Absolute: 0.1 10*3/uL (ref 0.0–0.1)
Basophils Relative: 0.6 % (ref 0.0–3.0)
Eosinophils Absolute: 0.2 10*3/uL (ref 0.0–0.7)
Eosinophils Relative: 2.3 % (ref 0.0–5.0)
HCT: 37.4 % (ref 36.0–46.0)
Hemoglobin: 12.4 g/dL (ref 12.0–15.0)
Lymphocytes Relative: 34.8 % (ref 12.0–46.0)
Lymphs Abs: 3.8 10*3/uL (ref 0.7–4.0)
MCHC: 33.3 g/dL (ref 30.0–36.0)
MCV: 81.7 fl (ref 78.0–100.0)
Monocytes Absolute: 0.8 10*3/uL (ref 0.1–1.0)
Monocytes Relative: 7.1 % (ref 3.0–12.0)
Neutro Abs: 6 10*3/uL (ref 1.4–7.7)
Neutrophils Relative %: 55.2 % (ref 43.0–77.0)
Platelets: 377 10*3/uL (ref 150.0–400.0)
RBC: 4.57 Mil/uL (ref 3.87–5.11)
RDW: 15.9 % — ABNORMAL HIGH (ref 11.5–15.5)
WBC: 10.9 10*3/uL — ABNORMAL HIGH (ref 4.0–10.5)

## 2022-11-23 LAB — BASIC METABOLIC PANEL
BUN: 16 mg/dL (ref 6–23)
CO2: 23 mEq/L (ref 19–32)
Calcium: 9 mg/dL (ref 8.4–10.5)
Chloride: 108 mEq/L (ref 96–112)
Creatinine, Ser: 1.39 mg/dL — ABNORMAL HIGH (ref 0.40–1.20)
GFR: 45.5 mL/min — ABNORMAL LOW (ref >60.00)
Glucose, Bld: 93 mg/dL (ref 70–99)
Potassium: 4.1 mEq/L (ref 3.5–5.1)
Sodium: 140 mEq/L (ref 135–145)

## 2022-11-23 LAB — MICROALBUMIN / CREATININE URINE RATIO
Creatinine,U: 74.6 mg/dL
Microalb Creat Ratio: 30.6 mg/g — ABNORMAL HIGH (ref 0.0–30.0)
Microalb, Ur: 22.8 mg/dL — ABNORMAL HIGH (ref 0.0–1.9)

## 2022-11-23 LAB — HEMOGLOBIN A1C: Hgb A1c MFr Bld: 6 % (ref 4.6–6.5)

## 2022-11-23 NOTE — Patient Instructions (Signed)

## 2022-11-23 NOTE — Progress Notes (Unsigned)
Subjective:  Patient ID: Maureen Price, female    DOB: [birth year]  Age: 47 y.o. MRN: [id number]  CC: Anemia, Hypertension, and Diabetes   HPI Maureen Price presents for f/up  ------  She has lost weight with lifestyle modifications.  She exercises and has good endurance.  She denies chest pain, shortness of breath, diaphoresis, or edema.  Outpatient Medications Prior to Visit  Medication Sig Dispense Refill   ALPRAZolam (XANAX) 0.5 MG tablet Take 1 tablet (0.5 mg total) by mouth 2 (two) times daily as needed for anxiety. 60 tablet 3   Fluticasone-Umeclidin-Vilant (TRELEGY ELLIPTA) 100-62.5-25 MCG/INH AEPB Inhale 1 puff into the lungs daily. 90 each 1   Insulin Pen Needle (BD PEN NEEDLE NANO 2ND GEN) 32G X 4 MM MISC Inject 1 Act into the skin 4 (four) times daily. Use to inject insulin once per day. DX E11.8 200 each 2   Multiple Vitamin (MULTI-VITAMIN DAILY PO) Take 1 tablet by mouth daily.     Oxycodone HCl 10 MG TABS Take 10 mg by mouth every 8 (eight) hours as needed.     thiamine (VITAMIN B-1) 50 MG tablet Take 1 tablet (50 mg total) by mouth daily. 90 tablet 1   tirzepatide (MOUNJARO) 2.5 MG/0.5ML Pen Inject 2.5 mg into the skin once a week. 2 mL 0   vitamin B-12 (CYANOCOBALAMIN) 1000 MCG tablet Take 2,000 mcg by mouth daily.     dapagliflozin propanediol (FARXIGA) 10 MG TABS tablet Take 1 tablet (10 mg total) by mouth daily before breakfast. 90 tablet 1   insulin aspart (NOVOLOG FLEXPEN) 100 UNIT/ML FlexPen Inject 15 Units into the skin 3 (three) times daily with meals. 42 mL 1   Insulin Glargine (BASAGLAR KWIKPEN) 100 UNIT/ML Inject 70 Units into the skin daily. 63 mL 1   aspirin EC 81 MG tablet Take 1 tablet (81 mg total) by mouth daily. (Patient not taking: Reported on 11/23/2022) 90 tablet 1   Ferric Maltol (ACCRUFER) 30 MG CAPS Take 1 capsule by mouth in the morning and at bedtime. (Patient not taking: Reported on 11/23/2022) [id number] capsule 1   folic acid (FOLVITE) 1 MG  tablet Take 1 tablet (1 mg total) by mouth daily. (Patient not taking: Reported on 11/23/2022) 90 tablet 1   nitroGLYCERIN (NITROSTAT) 0.4 MG SL tablet Place 1 tablet (0.4 mg total) under the tongue every 5 (five) minutes as needed for chest pain (Hold for SBP <100). (Patient not taking: Reported on 11/23/2022) 30 tablet 2   atorvastatin (LIPITOR) 20 MG tablet Take 1 tablet (20 mg total) by mouth daily. (Patient not taking: Reported on 11/23/2022) 90 tablet 1   Continuous Blood Gluc Receiver (DEXCOM G7 RECEIVER) DEVI 1 Act by Does not apply route daily. (Patient not taking: Reported on 11/23/2022) 9 each 1   Continuous Blood Gluc Sensor (DEXCOM G7 SENSOR) MISC 1 Act by Does not apply route daily. (Patient not taking: Reported on 11/23/2022) 9 each 1   ezetimibe (ZETIA) 10 MG tablet Take 1 tablet (10 mg total) by mouth daily. (Patient not taking: Reported on 11/23/2022) 90 tablet 3   Facility-Administered Medications Prior to Visit  Medication Dose Route Frequency Provider Last Rate Last Admin   fentaNYL (SUBLIMAZE) injection    Anesthesia Intra-op Suzy Bouchard, CRNA   50 mcg at 01/23/19 1308    ROS Review of Systems  Constitutional: Negative.  Negative for diaphoresis, fatigue and unexpected weight change.  HENT: Negative.    Eyes: Negative.  Respiratory:  Negative for cough, chest tightness, shortness of breath and wheezing.   Cardiovascular:  Negative for chest pain, palpitations and leg swelling.  Gastrointestinal:  Negative for abdominal pain, constipation, diarrhea, nausea and vomiting.  Endocrine: Negative.   Genitourinary: Negative.  Negative for difficulty urinating, dysuria and hematuria.  Musculoskeletal: Negative.   Skin: Negative.  Negative for color change.  Neurological: Negative.  Negative for dizziness and weakness.  Hematological:  Negative for adenopathy. Does not bruise/bleed easily.  Psychiatric/Behavioral: Negative.      Objective:  BP 132/78 (BP Location: Left Arm,  Patient Position: Sitting, Cuff Size: Normal)   Pulse 89   Temp 98.5 F (36.9 C) (Oral)   Ht 5\' 4"  (1.626 m)   Wt 205 lb (93 kg)   SpO2 98%   BMI 35.19 kg/m   BP Readings from Last 3 Encounters:  11/23/22 132/78  06/11/22 128/82  04/06/22 131/79    Wt Readings from Last 3 Encounters:  11/23/22 205 lb (93 kg)  06/11/22 219 lb (99.3 kg)  01/16/22 223 lb (101.2 kg)    Physical Exam Vitals reviewed.  Constitutional:      Appearance: Normal appearance. She is obese.  HENT:     Mouth/Throat:     Mouth: Mucous membranes are moist.  Eyes:     General: No scleral icterus.    Conjunctiva/sclera: Conjunctivae normal.  Cardiovascular:     Rate and Rhythm: Normal rate and regular rhythm.     Heart sounds: No murmur heard. Pulmonary:     Effort: Pulmonary effort is normal.     Breath sounds: No stridor. No wheezing, rhonchi or rales.  Abdominal:     General: Abdomen is flat.     Palpations: There is no mass.     Tenderness: There is no abdominal tenderness. There is no guarding.     Hernia: No hernia is present.  Musculoskeletal:        General: Normal range of motion.     Cervical back: Neck supple.     Right lower leg: No edema.     Left lower leg: No edema.  Lymphadenopathy:     Cervical: No cervical adenopathy.  Skin:    General: Skin is warm and dry.  Neurological:     General: No focal deficit present.     Mental Status: She is alert. Mental status is at baseline.  Psychiatric:        Mood and Affect: Mood normal.        Behavior: Behavior normal.     Lab Results  Component Value Date   WBC 10.9 (H) 11/23/2022   HGB 12.4 11/23/2022   HCT 37.4 11/23/2022   PLT 377.0 11/23/2022   GLUCOSE 93 11/23/2022   CHOL 149 06/11/2022   TRIG 92.0 06/11/2022   HDL 30.10 (L) 06/11/2022   LDLDIRECT 98.0 04/14/2021   LDLCALC 100 (H) 06/11/2022   ALT 17 06/11/2022   AST 13 06/11/2022   NA 140 11/23/2022   K 4.1 11/23/2022   CL 108 11/23/2022   CREATININE 1.39 (H)  11/23/2022   BUN 16 11/23/2022   CO2 23 11/23/2022   TSH 1.16 06/11/2022   INR 1.3 (H) 01/23/2019   HGBA1C 6.0 11/23/2022   MICROALBUR 22.8 (H) 11/23/2022    MM 3D SCREEN BREAST BILATERAL  Result Date: 08/19/2022 CLINICAL DATA:  Screening. EXAM: DIGITAL SCREENING BILATERAL MAMMOGRAM WITH TOMOSYNTHESIS AND CAD TECHNIQUE: Bilateral screening digital craniocaudal and mediolateral oblique mammograms were obtained. Bilateral screening digital  breast tomosynthesis was performed. The images were evaluated with computer-aided detection. COMPARISON:  None available. ACR Breast Density Category b: There are scattered areas of fibroglandular density. FINDINGS: There are no findings suspicious for malignancy. IMPRESSION: No mammographic evidence of malignancy. A result letter of this screening mammogram will be mailed directly to the patient. RECOMMENDATION: Screening mammogram in one year. (Code:SM-B-01Y) BI-RADS CATEGORY  1: Negative. Electronically Signed   By: Kristopher Oppenheim M.D.   On: 08/19/2022 13:24    Assessment & Plan:  Microalbuminuria due to type 2 diabetes mellitus (HCC) -     Urinalysis, Routine w reflex microscopic; Future -     Microalbumin / creatinine urine ratio; Future  Diabetes 1.5, managed as type 2 (HCC)-her blood sugar is adequately well-controlled. -     Dexcom G7 Receiver; 1 Act by Does not apply route daily.  Dispense: 9 each; Refill: 1 -     Dexcom G7 Sensor; 1 Act by Does not apply route daily.  Dispense: 9 each; Refill: 1  Iron deficiency anemia due to chronic blood loss-her H&H are normal now. -     CBC with Differential/Platelet; Future  Anemia due to acquired thiamine deficiency -     CBC with Differential/Platelet; Future  Insulin-requiring or dependent type II diabetes mellitus ([place]) -     Basic metabolic panel; Future -     Hemoglobin A1c; Future -     HM Diabetes Foot Exam -     Dapagliflozin Propanediol; Take 1 tablet (10 mg total) by mouth daily before  breakfast.  Dispense: 90 tablet; Refill: 1  Stage 3a chronic kidney disease (HCC)-renal function is stable.  Will continue the SGLT2 inhibitor. -     Basic metabolic panel; Future -     Urinalysis, Routine w reflex microscopic; Future -     Dapagliflozin Propanediol; Take 1 tablet (10 mg total) by mouth daily before breakfast.  Dispense: 90 tablet; Refill: 1  Dyslipidemia, goal LDL below 70 -     Atorvastatin Calcium; Take 1 tablet (20 mg total) by mouth daily.  Dispense: 90 tablet; Refill: 1 -     Ezetimibe; Take 1 tablet (10 mg total) by mouth daily.  Dispense: 90 tablet; Refill: 1  Coronary artery disease involving native coronary artery of native heart without angina pectoris -     Atorvastatin Calcium; Take 1 tablet (20 mg total) by mouth daily.  Dispense: 90 tablet; Refill: 1 -     Ezetimibe; Take 1 tablet (10 mg total) by mouth daily.  Dispense: 90 tablet; Refill: 1  Diabetic nephropathy associated with type 2 diabetes mellitus (HCC) -     Dapagliflozin Propanediol; Take 1 tablet (10 mg total) by mouth daily before breakfast.  Dispense: 90 tablet; Refill: 1  Screen for colon cancer -     Ambulatory referral to Gastroenterology  Tobacco use disorder -     Ambulatory Referral for Lung Cancer Scre     Follow-up: Return in about 4 months (around 03/25/2023).  [name], MD

## 2022-11-24 ENCOUNTER — Telehealth: Payer: Self-pay | Admitting: Internal Medicine

## 2022-11-24 DIAGNOSIS — E139 Other specified diabetes mellitus without complications: Secondary | ICD-10-CM

## 2022-11-24 LAB — URINALYSIS, ROUTINE W REFLEX MICROSCOPIC
Bilirubin Urine: NEGATIVE
Ketones, ur: NEGATIVE
Leukocytes,Ua: NEGATIVE
Nitrite: POSITIVE — AB
Specific Gravity, Urine: 1.02 (ref 1.000–1.030)
Urine Glucose: >=1000 — AB
Urobilinogen, UA: 0.2 (ref 0.0–1.0)
pH: 5.5 (ref 5.0–8.0)

## 2022-11-24 MED ORDER — ATORVASTATIN CALCIUM 20 MG PO TABS: 20.0000 mg | ORAL_TABLET | Freq: Every day | ORAL | 1 refills | Status: DC

## 2022-11-24 MED ORDER — DAPAGLIFLOZIN PROPANEDIOL 10 MG PO TABS: 10.0000 mg | ORAL_TABLET | Freq: Every day | ORAL | 1 refills | Status: DC

## 2022-11-24 MED ORDER — DEXCOM G7 RECEIVER DEVI: 1.0000 | Freq: Every day | 1 refills | Status: DC

## 2022-11-24 MED ORDER — EZETIMIBE 10 MG PO TABS: 10.0000 mg | ORAL_TABLET | Freq: Every day | ORAL | 1 refills | Status: AC

## 2022-11-24 MED ORDER — DEXCOM G7 SENSOR MISC: 1.0000 | Freq: Every day | 1 refills | Status: DC

## 2022-11-24 NOTE — Telephone Encounter (Signed)
Called [facility] spoke w/ Maureen Price the rx for sensor need to clarify if 9 is correct. She states the sensor will last 10 day. The quantity MD gave was for 9 can equal a 90 day supply. That is correct.Marland Kitchenlmb

## 2022-11-24 NOTE — Telephone Encounter (Signed)
[place] ([place]), [place] - [address]     Pharmacy called wanting a verbal change on medication Continuous Blood Gluc Sensor ([place]) MISC because it states  Disp( 9 each) it need to be more direct because of insurance purposes. Can someone reach out to pharmacy with update.

## 2022-12-17 ENCOUNTER — Other Ambulatory Visit: Payer: Self-pay | Admitting: Internal Medicine

## 2022-12-17 DIAGNOSIS — E139 Other specified diabetes mellitus without complications: Secondary | ICD-10-CM

## 2022-12-18 ENCOUNTER — Other Ambulatory Visit: Payer: Self-pay | Admitting: Internal Medicine

## 2022-12-18 DIAGNOSIS — E119 Type 2 diabetes mellitus without complications: Secondary | ICD-10-CM

## 2022-12-18 DIAGNOSIS — I2510 Atherosclerotic heart disease of native coronary artery without angina pectoris: Secondary | ICD-10-CM

## 2022-12-18 DIAGNOSIS — I70213 Atherosclerosis of native arteries of extremities with intermittent claudication, bilateral legs: Secondary | ICD-10-CM

## 2022-12-18 MED ORDER — ASPIRIN 81 MG PO TBEC
81.0000 mg | DELAYED_RELEASE_TABLET | Freq: Every day | ORAL | 1 refills | Status: DC
Start: 2022-12-18 — End: 2024-07-28

## 2022-12-18 MED ORDER — TIRZEPATIDE 5 MG/0.5ML ~~LOC~~ SOAJ
5.0000 mg | SUBCUTANEOUS | 0 refills | Status: DC
Start: 2022-12-18 — End: 2024-07-27

## 2022-12-31 ENCOUNTER — Encounter: Payer: Self-pay | Admitting: Internal Medicine

## 2023-01-05 ENCOUNTER — Encounter: Payer: Self-pay | Admitting: Internal Medicine

## 2023-01-05 ENCOUNTER — Ambulatory Visit (INDEPENDENT_AMBULATORY_CARE_PROVIDER_SITE_OTHER): Payer: BC Managed Care – PPO | Admitting: Internal Medicine

## 2023-01-05 VITALS — BP 136/80 | HR 79 | Temp 98.0°F | Ht 64.0 in | Wt 210.0 lb

## 2023-01-05 DIAGNOSIS — L987 Excessive and redundant skin and subcutaneous tissue: Secondary | ICD-10-CM | POA: Insufficient documentation

## 2023-01-05 DIAGNOSIS — N62 Hypertrophy of breast: Secondary | ICD-10-CM | POA: Insufficient documentation

## 2023-01-05 DIAGNOSIS — Z1211 Encounter for screening for malignant neoplasm of colon: Secondary | ICD-10-CM

## 2023-01-05 DIAGNOSIS — R0609 Other forms of dyspnea: Secondary | ICD-10-CM | POA: Diagnosis not present

## 2023-01-05 DIAGNOSIS — I2510 Atherosclerotic heart disease of native coronary artery without angina pectoris: Secondary | ICD-10-CM

## 2023-01-05 LAB — BRAIN NATRIURETIC PEPTIDE: Pro B Natriuretic peptide (BNP): 7 pg/mL (ref 0.0–100.0)

## 2023-01-05 LAB — TROPONIN I (HIGH SENSITIVITY): High Sens Troponin I: 6 ng/L (ref 2–17)

## 2023-01-05 NOTE — Progress Notes (Signed)
Subjective:  Patient ID: Maureen Price, female    DOB: [birth year]  Age: 47 y.o. MRN: [id number]  CC: Coronary Artery Disease, Diabetes, and Hypertension   HPI INAYA KIGHT presents for f/up ----  She only experiences dyspnea on exertion after 3 flights of stairs.  She denies chest pain, diaphoresis, palpitations, or edema.  She has chronic pain from large breasts and redundant skin.  She would like to see a Engineer, petroleum.  Outpatient Medications Prior to Visit  Medication Sig Dispense Refill   aspirin EC 81 MG tablet Take 1 tablet (81 mg total) by mouth daily. Swallow whole. 90 tablet 1   atorvastatin (LIPITOR) 20 MG tablet Take 1 tablet (20 mg total) by mouth daily. 90 tablet 1   Continuous Blood Gluc Receiver (DEXCOM G7 RECEIVER) DEVI 1 Act by Does not apply route daily. 1 each 1   Continuous Blood Gluc Sensor (DEXCOM G7 SENSOR) MISC 1 Act by Does not apply route daily. 9 each 1   dapagliflozin propanediol (FARXIGA) 10 MG TABS tablet Take 1 tablet (10 mg total) by mouth daily before breakfast. 90 tablet 1   ezetimibe (ZETIA) 10 MG tablet Take 1 tablet (10 mg total) by mouth daily. 90 tablet 1   Ferric Maltol (ACCRUFER) 30 MG CAPS Take 1 capsule by mouth in the morning and at bedtime. 180 capsule 1   Fluticasone-Umeclidin-Vilant (TRELEGY ELLIPTA) 100-62.5-25 MCG/INH AEPB Inhale 1 puff into the lungs daily. 90 each 1   folic acid (FOLVITE) 1 MG tablet Take 1 tablet (1 mg total) by mouth daily. 90 tablet 1   Insulin Pen Needle (BD PEN NEEDLE NANO 2ND GEN) 32G X 4 MM MISC Inject 1 Act into the skin 4 (four) times daily. Use to inject insulin once per day. DX E11.8 200 each 2   Multiple Vitamin (MULTI-VITAMIN DAILY PO) Take 1 tablet by mouth daily.     nitroGLYCERIN (NITROSTAT) 0.4 MG SL tablet Place 1 tablet (0.4 mg total) under the tongue every 5 (five) minutes as needed for chest pain (Hold for SBP <100). 30 tablet 2   Oxycodone HCl 10 MG TABS Take 10 mg by mouth every 8  (eight) hours as needed.     thiamine (VITAMIN B-1) 50 MG tablet Take 1 tablet (50 mg total) by mouth daily. 90 tablet 1   tirzepatide (MOUNJARO) 5 MG/0.5ML Pen Inject 5 mg into the skin once a week. 6 mL 0   vitamin B-12 (CYANOCOBALAMIN) 1000 MCG tablet Take 2,000 mcg by mouth daily.     ALPRAZolam (XANAX) 0.5 MG tablet Take 1 tablet (0.5 mg total) by mouth 2 (two) times daily as needed for anxiety. 60 tablet 3   Facility-Administered Medications Prior to Visit  Medication Dose Route Frequency Provider Last Rate Last Admin   fentaNYL (SUBLIMAZE) injection    Anesthesia Intra-op Edmonia Caprio, CRNA   50 mcg at 01/23/19 1308    ROS Review of Systems  Constitutional: Negative.  Negative for diaphoresis and fatigue.  HENT: Negative.    Eyes: Negative.   Respiratory:  Negative for cough, chest tightness, shortness of breath and wheezing.   Cardiovascular:  Negative for chest pain, palpitations and leg swelling.  Gastrointestinal:  Negative for abdominal pain, constipation, diarrhea, nausea and vomiting.  Endocrine: Negative.   Genitourinary: Negative.  Negative for difficulty urinating.  Musculoskeletal: Negative.  Negative for back pain and myalgias.  Skin: Negative.   Neurological:  Negative for dizziness and weakness.  Hematological:  Negative  for adenopathy. Does not bruise/bleed easily.  Psychiatric/Behavioral: Negative.      Objective:  BP 136/80 (BP Location: Left Arm, Patient Position: Sitting, Cuff Size: Large)   Pulse 79   Temp 98 F (36.7 C) (Oral)   Ht 5\' 4"  (1.626 m)   Wt 210 lb (95.3 kg)   SpO2 94%   BMI 36.05 kg/m   BP Readings from Last 3 Encounters:  01/05/23 136/80  11/23/22 132/78  06/11/22 128/82    Wt Readings from Last 3 Encounters:  01/05/23 210 lb (95.3 kg)  11/23/22 205 lb (93 kg)  06/11/22 219 lb (99.3 kg)    Physical Exam Vitals reviewed.  HENT:     Nose: Nose normal.     Mouth/Throat:     Mouth: Mucous membranes are moist.  Eyes:      General: No scleral icterus.    Conjunctiva/sclera: Conjunctivae normal.  Cardiovascular:     Rate and Rhythm: Normal rate and regular rhythm.     Heart sounds: Normal heart sounds, S1 normal and S2 normal. No murmur heard.    Comments: EKG- NSR, 73 bpm Septal infarct pattern is old [place] Q in III No acute ST/T wave changes No LVH Pulmonary:     Effort: Pulmonary effort is normal.     Breath sounds: No stridor. No wheezing, rhonchi or rales.  Abdominal:     General: Abdomen is flat.     Palpations: There is no mass.     Tenderness: There is no abdominal tenderness. There is no guarding.     Hernia: No hernia is present.  Musculoskeletal:     Cervical back: Neck supple.     Right lower leg: No edema.     Left lower leg: No edema.  Skin:    General: Skin is warm and dry.  Neurological:     General: No focal deficit present.     Mental Status: She is alert. Mental status is at baseline.  Psychiatric:        Mood and Affect: Mood normal.        Behavior: Behavior normal.     Lab Results  Component Value Date   WBC 10.9 (H) 11/23/2022   HGB 12.4 11/23/2022   HCT 37.4 11/23/2022   PLT 377.0 11/23/2022   GLUCOSE 93 11/23/2022   CHOL 149 06/11/2022   TRIG 92.0 06/11/2022   HDL 30.10 (L) 06/11/2022   LDLDIRECT 98.0 04/14/2021   LDLCALC 100 (H) 06/11/2022   ALT 17 06/11/2022   AST 13 06/11/2022   NA 140 11/23/2022   K 4.1 11/23/2022   CL 108 11/23/2022   CREATININE 1.39 (H) 11/23/2022   BUN 16 11/23/2022   CO2 23 11/23/2022   TSH 1.16 06/11/2022   INR 1.3 (H) 01/23/2019   HGBA1C 6.0 11/23/2022   MICROALBUR 22.8 (H) 11/23/2022    MM 3D SCREEN BREAST BILATERAL  Result Date: 08/19/2022 CLINICAL DATA:  Screening. EXAM: DIGITAL SCREENING BILATERAL MAMMOGRAM WITH TOMOSYNTHESIS AND CAD TECHNIQUE: Bilateral screening digital craniocaudal and mediolateral oblique mammograms were obtained. Bilateral screening digital breast tomosynthesis was performed. The images were  evaluated with computer-aided detection. COMPARISON:  None available. ACR Breast Density Category b: There are scattered areas of fibroglandular density. FINDINGS: There are no findings suspicious for malignancy. IMPRESSION: No mammographic evidence of malignancy. A result letter of this screening mammogram will be mailed directly to the patient. RECOMMENDATION: Screening mammogram in one year. (Code:SM-B-01Y) BI-RADS CATEGORY  1: Negative. Electronically Signed   By:  Sande Brothers M.D.   On: 08/19/2022 13:24    Assessment & Plan:   DOE (dyspnea on exertion)- EKG and labs are reassuring. -     Brain natriuretic peptide; Future -     Troponin I (High Sensitivity); Future -     EKG 12-Lead  Coronary artery disease involving native coronary artery of native heart without angina pectoris- Will continue risk factor modifications. -     Brain natriuretic peptide; Future -     Troponin I (High Sensitivity); Future  Screen for colon cancer  Breast hypertrophy in female -     Ambulatory referral to Plastic Surgery  Redundant skin of abdomen -     Ambulatory referral to Plastic Surgery     Follow-up: No follow-ups on file.  Sanda Linger, MD

## 2023-01-22 ENCOUNTER — Encounter: Payer: Self-pay | Admitting: Internal Medicine

## 2023-02-11 ENCOUNTER — Ambulatory Visit (INDEPENDENT_AMBULATORY_CARE_PROVIDER_SITE_OTHER): Payer: BC Managed Care – PPO | Admitting: Plastic Surgery

## 2023-02-11 ENCOUNTER — Encounter: Payer: Self-pay | Admitting: Plastic Surgery

## 2023-02-11 VITALS — BP 171/98 | HR 85 | Ht 64.0 in | Wt 209.4 lb

## 2023-02-11 DIAGNOSIS — M546 Pain in thoracic spine: Secondary | ICD-10-CM

## 2023-02-11 DIAGNOSIS — M542 Cervicalgia: Secondary | ICD-10-CM

## 2023-02-11 DIAGNOSIS — M793 Panniculitis, unspecified: Secondary | ICD-10-CM | POA: Diagnosis not present

## 2023-02-11 DIAGNOSIS — Z6835 Body mass index (BMI) 35.0-35.9, adult: Secondary | ICD-10-CM

## 2023-02-11 DIAGNOSIS — N62 Hypertrophy of breast: Secondary | ICD-10-CM | POA: Diagnosis not present

## 2023-02-11 DIAGNOSIS — F17210 Nicotine dependence, cigarettes, uncomplicated: Secondary | ICD-10-CM

## 2023-02-11 DIAGNOSIS — R21 Rash and other nonspecific skin eruption: Secondary | ICD-10-CM

## 2023-02-11 NOTE — Progress Notes (Signed)
Referring Provider Etta Grandchild, MD [address] [place],  [place] [address]   CC:  Chief Complaint  Patient presents with   Advice Only      Maureen Price is an 47 y.o. female.  HPI: Maureen Price is a 46 year old female who presents today for evaluation for bilateral breast reduction and panniculectomy.  The patient has lost almost 120 pounds and now notes that she has increasing upper back and neck pain from the size of her breast.  She also notes significant grooving in her shoulders from her bra straps which she has had for many years.  Additionally she is complaining of rashes on the posterior aspect of her pannus and in the intertriginous regions due to the excess skin hanging from her lower anterior abdominal wall  Allergies  Allergen Reactions   Erythromycin Anaphylaxis   Penicillins Anaphylaxis    Tolerates meropenem   Metformin And Related Other (See Comments)    Diarrhea, upset stomach   Viibryd [Vilazodone Hcl] Other (See Comments)    Altered mental status.     Bee Venom Swelling    Throat swells    Outpatient Encounter Medications as of 02/11/2023  Medication Sig   aspirin EC 81 MG tablet Take 1 tablet (81 mg total) by mouth daily. Swallow whole.   atorvastatin (LIPITOR) 20 MG tablet Take 1 tablet (20 mg total) by mouth daily.   Continuous Blood Gluc Receiver (DEXCOM G7 RECEIVER) DEVI 1 Act by Does not apply route daily.   Continuous Blood Gluc Sensor (DEXCOM G7 SENSOR) MISC 1 Act by Does not apply route daily.   ezetimibe (ZETIA) 10 MG tablet Take 1 tablet (10 mg total) by mouth daily.   Ferric Maltol (ACCRUFER) 30 MG CAPS Take 1 capsule by mouth in the morning and at bedtime.   Fluticasone-Umeclidin-Vilant (TRELEGY ELLIPTA) 100-62.5-25 MCG/INH AEPB Inhale 1 puff into the lungs daily.   folic acid (FOLVITE) 1 MG tablet Take 1 tablet (1 mg total) by mouth daily.   Insulin Pen Needle (BD PEN NEEDLE NANO 2ND GEN) 32G X 4 MM MISC Inject 1 Act into the  skin 4 (four) times daily. Use to inject insulin once per day. DX E11.8   Multiple Vitamin (MULTI-VITAMIN DAILY PO) Take 1 tablet by mouth daily.   nitroGLYCERIN (NITROSTAT) 0.4 MG SL tablet Place 1 tablet (0.4 mg total) under the tongue every 5 (five) minutes as needed for chest pain (Hold for SBP <100).   Oxycodone HCl 10 MG TABS Take 10 mg by mouth every 8 (eight) hours as needed.   thiamine (VITAMIN B-1) 50 MG tablet Take 1 tablet (50 mg total) by mouth daily.   vitamin B-12 (CYANOCOBALAMIN) 1000 MCG tablet Take 2,000 mcg by mouth daily.   dapagliflozin propanediol (FARXIGA) 10 MG TABS tablet Take 1 tablet (10 mg total) by mouth daily before breakfast. (Patient not taking: Reported on 02/11/2023)   tirzepatide (MOUNJARO) 5 MG/0.5ML Pen Inject 5 mg into the skin once a week. (Patient not taking: Reported on 02/11/2023)   Facility-Administered Encounter Medications as of 02/11/2023  Medication   fentaNYL (SUBLIMAZE) injection     Past Medical History:  Diagnosis Date   Acute myocardial infarction of other anterior wall, initial episode of care    Anginal pain (HCC)    Anxiety    Arthritis    Asthma    CAD (coronary artery disease)    CHF (congestive heart failure) (HCC)    Depression    Diabetes  mellitus    2002; no meds since 2004   Dyslipidemia, goal LDL below 70 10/26/2013   Dysrhythmia    GERD (gastroesophageal reflux disease)    Headache(784.0)    Obesity    Paroxysmal ventricular tachycardia (HCC)    Shortness of breath    STEMI (ST elevation myocardial infarction) (HCC)    Tobacco abuse     Past Surgical History:  Procedure Laterality Date   CESAREAN SECTION     CESAREAN SECTION  [phone]   CHOLECYSTECTOMY     CORONARY ANGIOPLASTY WITH STENT PLACEMENT     GALLBLADDER SURGERY     INCISION AND DRAINAGE PERIRECTAL ABSCESS N/A 01/23/2019   Procedure: IRRIGATION AND DEBRIDEMENT PERIRECTAL ABSCESS;  Surgeon: Emelia Loron, MD;  Location: [facility] OR;  Service: General;   Laterality: N/A;   LEFT HEART CATH N/A 10/24/2013   Procedure: LEFT HEART CATH;  Surgeon: Runell Gess, MD;  Location: [facility] CATH LAB;  Service: Cardiovascular;  Laterality: N/A;   LEFT HEART CATHETERIZATION WITH CORONARY ANGIOGRAM N/A 10/26/2013   Procedure: LEFT HEART CATHETERIZATION WITH CORONARY ANGIOGRAM;  Surgeon: Kathleene Hazel, MD;  Location: [facility] CATH LAB;  Service: Cardiovascular;  Laterality: N/A;   LEFT HEART CATHETERIZATION WITH CORONARY ANGIOGRAM N/A 03/29/2014   Procedure: LEFT HEART CATHETERIZATION WITH CORONARY ANGIOGRAM;  Surgeon: Runell Gess, MD;  Location: [facility] CATH LAB;  Service: Cardiovascular;  Laterality: N/A;   TUBAL LIGATION     2000    Family History  Problem Relation Age of Onset   Arthritis Mother    Diabetes Mother    Heart disease Father    Stroke Father    Breast cancer Maternal Grandmother    Diabetes Maternal Grandfather    Cancer Neg Hx    Alcohol abuse Neg Hx    Early death Neg Hx    Kidney disease Neg Hx    Hypertension Neg Hx    Hyperlipidemia Neg Hx    Thyroid disease Neg Hx     Social History   Social History Narrative   Caffienated drinks- yes   Seat belt use often- yes   Regular Exercise- yes   Smoke alarm in the home- yes   Firearms/guns in the home- no   History of physical abuse- no              Review of Systems General: Denies fevers, chills, weight loss CV: Denies chest pain, shortness of breath, palpitations Breast: Large breasts which the patient feels is contributing to her upper back and neck pain Pannus: Excess skin and fat which the patient feels is contributing to her ongoing rashes and discomfort.  Physical Exam    02/11/2023   10:32 AM 01/05/2023    9:23 AM 11/23/2022    3:43 PM  Vitals with BMI  Height 5\' 4"  5\' 4"  5\' 4"   Weight 209 lbs 6 oz 210 lbs 205 lbs  BMI 35.93 36.03 35.17  Systolic 171 136 956  Diastolic 98 80 78  Pulse 85 79 89    General:  No acute distress,  Alert and oriented,  Non-Toxic, Normal speech and affect Breast: Patient has large pendulous breast with grade 3 ptosis.  The right side is heavier than the left side.  She has no dominant masses on physical exam and the nipples are normal in appearance without evidence of nipple discharge.   her sternal notch to nipple distance on the right is 37 cm and 38 cm on the left her nipple to fold  distance is 17 cm on the right and 14 cm on the left Mammogram: Mammogram December 2023 was BI-RADS 1  Assessment/Plan Macromastia: The patient would likely benefit from a bilateral breast reduction.  I believe that I can remove between 600 g and 700 g from the right breast and 500 g from the left breast.  I discussed breast reduction at length with the patient including the location of the incisions and the unpredictable nature of scarring.  We discussed the risks of bleeding, infection, and seroma formation.  She understands I will use drains postoperatively.  We discussed the risks of nipple loss due to nipple ischemia.  We discussed the possibility that her mammograms may be difficult to interpret in the future and this may lead to additional testing or breast biopsies.  Her postoperative limitations will include no heavy lifting greater than 20 pounds, no vigorous activity, and no submerging the incisions in water for 6 weeks.  She may return to light activity as tolerated.  Understands the importance of early ambulation to prevent DVT after surgery. Panniculitis: Patient has a pannus which extends down to the symphysis pubis.  I think this would be amenable to resection.  I believe that she would obtain relief from her skin irritation.  We again discussed this procedure at length including the location of the incisions and that this is not designed to address any of the skin and fat above her umbilicus.  She will need to wear compression for 6 weeks after the procedure and will have drains which will remain in place up to 4 weeks. The  patient's resection volume for her reduction in the pannus are small enough that I believe both of these procedures can be performed on the same day.  I do think she will need to spend the night in the hospital to ensure pain control. Photographs were obtained today with her consent.  Will submit a surgery request on her behalf. Currently smoking. Must have a negative nicotine level prior to surgery  Santiago Glad 02/11/2023, 12:36 PM

## 2023-04-15 ENCOUNTER — Ambulatory Visit: Payer: BC Managed Care – PPO | Admitting: Plastic Surgery

## 2024-02-10 LAB — CBC AND DIFFERENTIAL
HCT: 36 (ref 36–46)
Hemoglobin: 12.5 (ref 12.0–16.0)
Platelets: 330 K/uL (ref 150–400)
WBC: 10

## 2024-07-27 ENCOUNTER — Encounter: Payer: Self-pay | Admitting: Internal Medicine

## 2024-07-27 ENCOUNTER — Ambulatory Visit (INDEPENDENT_AMBULATORY_CARE_PROVIDER_SITE_OTHER): Payer: Self-pay | Admitting: Internal Medicine

## 2024-07-27 VITALS — BP 164/90 | HR 89 | Temp 98.3°F | Resp 16 | Ht 64.0 in | Wt 209.2 lb

## 2024-07-27 DIAGNOSIS — E119 Type 2 diabetes mellitus without complications: Secondary | ICD-10-CM

## 2024-07-27 DIAGNOSIS — I2510 Atherosclerotic heart disease of native coronary artery without angina pectoris: Secondary | ICD-10-CM | POA: Diagnosis not present

## 2024-07-27 DIAGNOSIS — E785 Hyperlipidemia, unspecified: Secondary | ICD-10-CM

## 2024-07-27 DIAGNOSIS — E1129 Type 2 diabetes mellitus with other diabetic kidney complication: Secondary | ICD-10-CM

## 2024-07-27 DIAGNOSIS — Z0000 Encounter for general adult medical examination without abnormal findings: Secondary | ICD-10-CM | POA: Diagnosis not present

## 2024-07-27 DIAGNOSIS — Z794 Long term (current) use of insulin: Secondary | ICD-10-CM | POA: Diagnosis not present

## 2024-07-27 DIAGNOSIS — Z0001 Encounter for general adult medical examination with abnormal findings: Secondary | ICD-10-CM

## 2024-07-27 DIAGNOSIS — Z1211 Encounter for screening for malignant neoplasm of colon: Secondary | ICD-10-CM

## 2024-07-27 DIAGNOSIS — N1831 Chronic kidney disease, stage 3a: Secondary | ICD-10-CM

## 2024-07-27 DIAGNOSIS — E1121 Type 2 diabetes mellitus with diabetic nephropathy: Secondary | ICD-10-CM

## 2024-07-27 DIAGNOSIS — E519 Thiamine deficiency, unspecified: Secondary | ICD-10-CM

## 2024-07-27 DIAGNOSIS — I5032 Chronic diastolic (congestive) heart failure: Secondary | ICD-10-CM

## 2024-07-27 DIAGNOSIS — I70213 Atherosclerosis of native arteries of extremities with intermittent claudication, bilateral legs: Secondary | ICD-10-CM | POA: Diagnosis not present

## 2024-07-27 DIAGNOSIS — Z1231 Encounter for screening mammogram for malignant neoplasm of breast: Secondary | ICD-10-CM

## 2024-07-27 DIAGNOSIS — I10 Essential (primary) hypertension: Secondary | ICD-10-CM

## 2024-07-27 DIAGNOSIS — D520 Dietary folate deficiency anemia: Secondary | ICD-10-CM

## 2024-07-27 DIAGNOSIS — J410 Simple chronic bronchitis: Secondary | ICD-10-CM

## 2024-07-27 DIAGNOSIS — F17200 Nicotine dependence, unspecified, uncomplicated: Secondary | ICD-10-CM

## 2024-07-27 LAB — URINALYSIS, ROUTINE W REFLEX MICROSCOPIC
Bilirubin Urine: NEGATIVE
Ketones, ur: NEGATIVE
Leukocytes,Ua: NEGATIVE
Nitrite: NEGATIVE
RBC / HPF: NONE SEEN (ref 0–?)
Specific Gravity, Urine: 1.015 (ref 1.000–1.030)
Total Protein, Urine: 100 — AB
Urine Glucose: NEGATIVE
Urobilinogen, UA: 0.2 (ref 0.0–1.0)
pH: 6 (ref 5.0–8.0)

## 2024-07-27 LAB — TSH: TSH: 1.21 u[IU]/mL (ref 0.35–5.50)

## 2024-07-27 LAB — TROPONIN I (HIGH SENSITIVITY): High Sens Troponin I: 6 ng/L (ref 2–17)

## 2024-07-27 LAB — LIPID PANEL
Cholesterol: 153 mg/dL (ref 0–200)
HDL: 39.6 mg/dL (ref >39.00)
LDL Cholesterol: 96 mg/dL (ref 0–99)
NonHDL: 113.44
Total CHOL/HDL Ratio: 4
Triglycerides: 88 mg/dL (ref 0.0–149.0)
VLDL: 17.6 mg/dL (ref 0.0–40.0)

## 2024-07-27 LAB — BASIC METABOLIC PANEL WITH GFR
BUN: 8 mg/dL (ref 6–23)
CO2: 26 meq/L (ref 19–32)
Calcium: 8.4 mg/dL (ref 8.4–10.5)
Chloride: 108 meq/L (ref 96–112)
Creatinine, Ser: 1.18 mg/dL (ref 0.40–1.20)
GFR: 54.73 mL/min — ABNORMAL LOW (ref >60.00)
Glucose, Bld: 100 mg/dL — ABNORMAL HIGH (ref 70–99)
Potassium: 3.7 meq/L (ref 3.5–5.1)
Sodium: 142 meq/L (ref 135–145)

## 2024-07-27 LAB — CK: Total CK: 56 U/L (ref 17–177)

## 2024-07-27 LAB — MICROALBUMIN / CREATININE URINE RATIO
Creatinine,U: 66.2 mg/dL
Microalb Creat Ratio: 1367.1 mg/g — ABNORMAL HIGH (ref 0.0–30.0)
Microalb, Ur: 90.6 mg/dL — ABNORMAL HIGH (ref 0.0–1.9)

## 2024-07-27 LAB — HEMOGLOBIN A1C: Hgb A1c MFr Bld: 6.3 % (ref 4.6–6.5)

## 2024-07-27 MED ORDER — VITAMIN B-12 1000 MCG PO TABS: 2000.0000 ug | ORAL_TABLET | Freq: Every day | ORAL | 0 refills | Status: AC

## 2024-07-27 MED ORDER — DAPAGLIFLOZIN PROPANEDIOL 10 MG PO TABS: 10.0000 mg | ORAL_TABLET | Freq: Every day | ORAL | 0 refills | Status: DC

## 2024-07-27 MED ORDER — FOLIC ACID 1 MG PO TABS: 1.0000 mg | ORAL_TABLET | Freq: Every day | ORAL | 0 refills | Status: AC

## 2024-07-27 MED ORDER — VITAMIN B-1 50 MG PO TABS: 50.0000 mg | ORAL_TABLET | Freq: Every day | ORAL | 1 refills | Status: AC

## 2024-07-27 MED ORDER — OLMESARTAN MEDOXOMIL 20 MG PO TABS: 20.0000 mg | ORAL_TABLET | Freq: Every day | ORAL | 0 refills | Status: DC

## 2024-07-27 MED ORDER — AMLODIPINE BESYLATE 10 MG PO TABS: 10.0000 mg | ORAL_TABLET | Freq: Every day | ORAL | 0 refills | Status: DC

## 2024-07-27 MED ORDER — ATORVASTATIN CALCIUM 20 MG PO TABS: 20.0000 mg | ORAL_TABLET | Freq: Every day | ORAL | 0 refills | Status: AC

## 2024-07-27 NOTE — Patient Instructions (Signed)

## 2024-07-27 NOTE — Progress Notes (Signed)
 Subjective:  Patient ID: Maureen Price, female    DOB: 1976/08/07  Age: 48 y.o. MRN: [id number]  CC: Annual Exam, Hypertension, Diabetes, Coronary Artery Disease, and Hyperlipidemia   HPI Maureen Price presents for a CPX and f/up ---  Discussed the use of AI scribe software for clinical note transcription with the patient, who gave verbal consent to proceed.  History of Present Illness Maureen Price is a 48 year old female who presents with elevated blood pressure and associated symptoms.  She has been experiencing elevated blood pressure, first noticed after checking it at work due to a severe headache. She describes having blurry vision in one eye and occasional slurred speech. Her last eye exam was in 2024.  She experiences chest pain described as a dull pressure over the left side of her chest, which eases up after some time. This occurs both at rest and during activity, such as when she is moving quickly at work. She also reports occasional shortness of breath, which can occur while sitting and watching TV, and is relieved by taking deep breaths and rubbing her chest.  She reports muscle cramps and twitches, particularly in her hands and legs. Her hands swell and become numb, requiring her to shake them. She experienced a severe leg cramp while sleeping, which required rubbing to relieve. She also notes that her knee sometimes buckles when walking.  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 She smokes cigarettes and consumes caffeine but does not drink alcohol. She is up to date on her flu and pneumonia vaccines and has received COVID-19 vaccinations. She has a family history of lung cancer, as her father died from it.   Outpatient Medications Prior to Visit  Medication Sig Dispense Refill   ezetimibe  (ZETIA ) 10 MG tablet Take 1 tablet (10 mg total) by mouth daily. (Patient not taking: Reported on 07/27/2024) 90 tablet 1   Insulin  Pen Needle  (BD PEN NEEDLE NANO 2ND GEN) 32G X 4 MM MISC Inject 1 Act into the skin 4 (four) times daily. Use to inject insulin  once per day. DX E11.8 (Patient not taking: Reported on 07/27/2024) 200 each 2   Multiple Vitamin (MULTI-VITAMIN DAILY PO) Take 1 tablet by mouth daily. (Patient not taking: Reported on 07/27/2024)     nitroGLYCERIN  (NITROSTAT ) 0.4 MG SL tablet Place 1 tablet (0.4 mg total) under the tongue every 5 (five) minutes as needed for chest pain (Hold for SBP <100). (Patient not taking: Reported on 07/27/2024) 30 tablet 2   aspirin  EC 81 MG tablet Take 1 tablet (81 mg total) by mouth daily. Swallow whole. (Patient not taking: Reported on 07/27/2024) 90 tablet 1   atorvastatin  (LIPITOR ) 20 MG tablet Take 1 tablet (20 mg total) by mouth daily. (Patient not taking: Reported on 07/27/2024) 90 tablet 1   Continuous Blood Gluc Receiver (DEXCOM G7 RECEIVER) DEVI 1 Act by Does not apply route daily. (Patient not taking: Reported on 07/27/2024) 1 each 1   Continuous Blood Gluc Sensor (DEXCOM G7 SENSOR) MISC 1 Act by Does not apply route daily. (Patient not taking: Reported on 07/27/2024) 9 each 1   dapagliflozin  propanediol (FARXIGA ) 10 MG TABS tablet Take 1 tablet (10 mg total) by mouth daily before breakfast. (Patient not taking: Reported on 07/27/2024) 90 tablet 1   Ferric Maltol  (ACCRUFER ) 30 MG CAPS Take 1 capsule by mouth in the morning and at bedtime. (Patient not taking: Reported on 07/27/2024) 180 capsule 1  Fluticasone-Umeclidin-Vilant (TRELEGY ELLIPTA ) 100-62.5-25 MCG/INH AEPB Inhale 1 puff into the lungs daily. (Patient not taking: Reported on 07/27/2024) 90 each 1   folic acid  (FOLVITE ) 1 MG tablet Take 1 tablet (1 mg total) by mouth daily. (Patient not taking: Reported on 07/27/2024) 90 tablet 1   Oxycodone  HCl 10 MG TABS Take 10 mg by mouth every 8 (eight) hours as needed. (Patient not taking: Reported on 07/27/2024)     thiamine (VITAMIN B-1) 50 MG tablet Take 1 tablet (50 mg total) by  mouth daily. (Patient not taking: Reported on 07/27/2024) 90 tablet 1   tirzepatide  (MOUNJARO ) 5 MG/0.5ML Pen Inject 5 mg into the skin once a week. (Patient not taking: Reported on 07/27/2024) 6 mL 0   vitamin B-12 (CYANOCOBALAMIN ) 1000 MCG tablet Take 2,000 mcg by mouth daily. (Patient not taking: Reported on 07/27/2024)     Facility-Administered Medications Prior to Visit  Medication Dose Route Frequency Provider Last Rate Last Admin   fentaNYL  (SUBLIMAZE ) injection    Anesthesia Intra-op Auston, Amanda M, CRNA   50 mcg at 01/23/19 1308    ROS Review of Systems  Constitutional:  Negative for appetite change, chills, diaphoresis, fatigue and fever.  HENT: Negative.  Negative for trouble swallowing.   Eyes:  Positive for visual disturbance. Negative for photophobia and pain.  Respiratory:  Positive for shortness of breath. Negative for cough, chest tightness, wheezing and stridor.   Cardiovascular:  Positive for chest pain. Negative for palpitations and leg swelling.  Gastrointestinal:  Positive for constipation. Negative for abdominal pain, blood in stool, diarrhea, nausea and vomiting.  Endocrine: Negative.   Genitourinary: Negative.  Negative for decreased urine volume, difficulty urinating, dysuria and hematuria.  Musculoskeletal:  Positive for myalgias.  Neurological:  Positive for speech difficulty, numbness and headaches. Negative for dizziness, syncope, weakness and light-headedness.  Hematological:  Negative for adenopathy. Does not bruise/bleed easily.  Psychiatric/Behavioral: Negative.      Objective:  BP (!) 164/90 (BP Location: Left Arm, Patient Position: Sitting, Cuff Size: Normal) Comment: 166/88 BP (R)  Pulse 89   Temp 98.3 F (36.8 C) (Oral)   Resp 16   Ht 5' 4 (1.626 m)   Wt 209 lb 3.2 oz (94.9 kg)   SpO2 97%   BMI 35.91 kg/m   BP Readings from Last 3 Encounters:  07/27/24 (!) 164/90  02/11/23 (!) 171/98  01/05/23 136/80    Wt Readings from Last 3  Encounters:  07/27/24 209 lb 3.2 oz (94.9 kg)  02/11/23 209 lb 6.4 oz (95 kg)  01/05/23 210 lb (95.3 kg)    Physical Exam Vitals reviewed.  Constitutional:      Appearance: Normal appearance.  HENT:     Mouth/Throat:     Mouth: Mucous membranes are moist.  Eyes:     General: No scleral icterus.    Extraocular Movements: Extraocular movements intact.     Pupils: Pupils are equal, round, and reactive to light.  Cardiovascular:     Rate and Rhythm: Normal rate and regular rhythm.     Pulses: Normal pulses.     Heart sounds: No murmur heard.    No friction rub. No gallop.     Comments: EKG--- NSR, 75 bpm No LVH, Q waves, or ST/T wave changes  Pulmonary:     Effort: Pulmonary effort is normal.     Breath sounds: No stridor. No wheezing, rhonchi or rales.  Abdominal:     General: Abdomen is protuberant. Bowel sounds are normal. There  is no distension.     Palpations: There is no hepatomegaly, splenomegaly or mass.     Tenderness: There is no abdominal tenderness. There is no guarding.  Musculoskeletal:     Cervical back: Neck supple.     Right lower leg: No edema.     Left lower leg: No edema.  Lymphadenopathy:     Cervical: No cervical adenopathy.  Skin:    General: Skin is warm and dry.     Findings: No lesion or rash.  Neurological:     General: No focal deficit present.     Mental Status: She is alert. Mental status is at baseline.  Psychiatric:        Mood and Affect: Mood normal.        Behavior: Behavior normal.     Lab Results  Component Value Date   WBC 10.0 02/10/2024   HGB 12.5 02/10/2024   HCT 36 02/10/2024   PLT 330 02/10/2024   GLUCOSE 100 (H) 07/27/2024   CHOL 153 07/27/2024   TRIG 88.0 07/27/2024   HDL 39.60 07/27/2024   LDLDIRECT 98.0 04/14/2021   LDLCALC 96 07/27/2024   ALT 17 06/11/2022   AST 13 06/11/2022   NA 142 07/27/2024   K 3.7 07/27/2024   CL 108 07/27/2024   CREATININE 1.18 07/27/2024   BUN 8 07/27/2024   CO2 26 07/27/2024    TSH 1.21 07/27/2024   INR 1.3 (H) 01/23/2019   HGBA1C 6.3 07/27/2024   MICROALBUR 90.6 (H) 07/27/2024    MM 3D SCREEN BREAST BILATERAL Result Date: 08/19/2022 CLINICAL DATA:  Screening. EXAM: DIGITAL SCREENING BILATERAL MAMMOGRAM WITH TOMOSYNTHESIS AND CAD TECHNIQUE: Bilateral screening digital craniocaudal and mediolateral oblique mammograms were obtained. Bilateral screening digital breast tomosynthesis was performed. The images were evaluated with computer-aided detection. COMPARISON:  None available. ACR Breast Density Category b: There are scattered areas of fibroglandular density. FINDINGS: There are no findings suspicious for malignancy. IMPRESSION: No mammographic evidence of malignancy. A result letter of this screening mammogram will be mailed directly to the patient. RECOMMENDATION: Screening mammogram in one year. (Code:SM-B-01Y) BI-RADS CATEGORY  1: Negative. Electronically Signed   By: Serena  Chacko M.D.   On: 08/19/2022 13:24    Estimated Creatinine Clearance: 65.2 mL/min (by C-G formula based on SCr of 1.18 mg/dL).   Assessment & Plan:  Atherosclerosis of native artery of both lower extremities with intermittent claudication -     Lipid panel; Future -     Aspirin ; Take 1 tablet (81 mg total) by mouth daily. Swallow whole.  Dispense: 90 tablet; Refill: 1  Dyslipidemia, goal LDL below 70 -     Lipid panel; Future -     TSH; Future -     CK; Future -     Atorvastatin  Calcium ; Take 1 tablet (20 mg total) by mouth daily.  Dispense: 90 tablet; Refill: 0  Tobacco use disorder  Microalbuminuria due to type 2 diabetes mellitus (HCC) -     Urinalysis, Routine w reflex microscopic; Future -     Microalbumin / creatinine urine ratio; Future -     Dapagliflozin  Propanediol; Take 1 tablet (10 mg total) by mouth daily before breakfast.  Dispense: 90 tablet; Refill: 0  Visit for screening mammogram -     Digital Screening Mammogram, Left and Right; Future  Chronic diastolic  (congestive) heart failure (HCC) -     Olmesartan  Medoxomil; Take 1 tablet (20 mg total) by mouth daily.  Dispense: 90 tablet; Refill: 0  Screen for colon cancer -     Cologuard  Encounter for general adult medical examination with abnormal findings- Exam completed, labs reviewed, vaccines reviewed, cancer screenings addressed, pt ed material was given.   Insulin -requiring or dependent type II diabetes mellitus (HCC)- Her blood sugar is well controlled. -     Urinalysis, Routine w reflex microscopic; Future -     Hemoglobin A1c; Future -     Basic metabolic panel with GFR; Future -     Ambulatory referral to Ophthalmology -     HM Diabetes Foot Exam  Diabetic nephropathy associated with type 2 diabetes mellitus (HCC)- Will start an ARB and SGLT2-inh. -     Urinalysis, Routine w reflex microscopic; Future -     Basic metabolic panel with GFR; Future -     Microalbumin / creatinine urine ratio; Future -     Dapagliflozin  Propanediol; Take 1 tablet (10 mg total) by mouth daily before breakfast.  Dispense: 90 tablet; Refill: 0 -     Olmesartan  Medoxomil; Take 1 tablet (20 mg total) by mouth daily.  Dispense: 90 tablet; Refill: 0 -     AMB Referral VBCI Care Management  Hypertension, unspecified type- Will try to achieve better BP control. -     EKG 12-Lead -     amLODIPine  Besylate; Take 1 tablet (10 mg total) by mouth daily.  Dispense: 90 tablet; Refill: 0 -     Olmesartan  Medoxomil; Take 1 tablet (20 mg total) by mouth daily.  Dispense: 90 tablet; Refill: 0 -     AMB Referral VBCI Care Management  Coronary artery disease involving native coronary artery of native heart without angina pectoris -     Troponin I (High Sensitivity); Future -     Atorvastatin  Calcium ; Take 1 tablet (20 mg total) by mouth daily.  Dispense: 90 tablet; Refill: 0 -     CT CORONARY MORPH W/CTA COR W/SCORE W/CA W/CM &/OR WO/CM; Future -     amLODIPine  Besylate; Take 1 tablet (10 mg total) by mouth daily.   Dispense: 90 tablet; Refill: 0 -     Olmesartan  Medoxomil; Take 1 tablet (20 mg total) by mouth daily.  Dispense: 90 tablet; Refill: 0 -     AMB Referral VBCI Care Management -     Aspirin ; Take 1 tablet (81 mg total) by mouth daily. Swallow whole.  Dispense: 90 tablet; Refill: 1  Dietary folate deficiency anemia -     Vitamin B-12; Take 2 tablets (2,000 mcg total) by mouth daily.  Dispense: 90 tablet; Refill: 0 -     Folic Acid ; Take 1 tablet (1 mg total) by mouth daily.  Dispense: 90 tablet; Refill: 0  Anemia due to acquired thiamine deficiency -     Vitamin B-1; Take 1 tablet (50 mg total) by mouth daily.  Dispense: 90 tablet; Refill: 1  Simple chronic bronchitis (HCC) -     Trelegy Ellipta ; Inhale 1 puff into the lungs daily.  Dispense: 120 each; Refill: 0     Follow-up: Return in about 3 months (around 10/27/2024).  Debby Molt, MD

## 2024-07-28 ENCOUNTER — Encounter: Payer: Self-pay | Admitting: Internal Medicine

## 2024-07-28 ENCOUNTER — Telehealth: Payer: Self-pay | Admitting: *Deleted

## 2024-07-28 ENCOUNTER — Telehealth: Payer: Self-pay

## 2024-07-28 ENCOUNTER — Other Ambulatory Visit (HOSPITAL_COMMUNITY): Payer: Self-pay

## 2024-07-28 DIAGNOSIS — I10 Essential (primary) hypertension: Secondary | ICD-10-CM | POA: Insufficient documentation

## 2024-07-28 MED ORDER — TRELEGY ELLIPTA 100-62.5-25 MCG/ACT IN AEPB: 1.0000 | INHALATION_SPRAY | Freq: Every day | RESPIRATORY_TRACT | 0 refills | Status: AC

## 2024-07-28 MED ORDER — ASPIRIN 81 MG PO TBEC: 81.0000 mg | DELAYED_RELEASE_TABLET | Freq: Every day | ORAL | 1 refills | Status: AC

## 2024-07-28 MED ORDER — FREESTYLE LIBRE 3 PLUS SENSOR MISC: 1.0000 | 1 refills | Status: AC

## 2024-07-28 NOTE — Telephone Encounter (Signed)
 Pharmacy Patient Advocate Encounter   Received notification from Onbase that prior authorization for Farxiga  10 is required/requested.   Insurance verification completed.   The patient is insured through [facility].   Spoke with RPH [facility]. Pharmacy needs another rx sent. Will complete PA request after Pharmacy receives rx.

## 2024-07-28 NOTE — Progress Notes (Signed)
 Care Guide Pharmacy Note  07/28/2024 Name: [name] MRN: [id number] DOB: 08-01-1976  Referred By: Joshua Debby CROME, MD Reason for referral: Complex Care Management (Outreach to schedule referral with pharmacist )   Maureen Price is a 48 y.o. year old female who is a primary care patient of Joshua Debby CROME, MD.  [name] was referred to the pharmacist for assistance related to: DMII  An unsuccessful telephone outreach was attempted today to contact the patient who was referred to the pharmacy team for assistance with medication management. Additional attempts will be made to contact the patient.  Thedford Franks, CMA [place]  [facility], [facility] Guide Direct Dial: [phone]  Fax: [phone] Website: [email or web address]

## 2024-08-08 ENCOUNTER — Telehealth: Payer: Self-pay

## 2024-08-08 ENCOUNTER — Other Ambulatory Visit (HOSPITAL_COMMUNITY): Payer: Self-pay

## 2024-08-08 ENCOUNTER — Other Ambulatory Visit

## 2024-08-08 DIAGNOSIS — E1121 Type 2 diabetes mellitus with diabetic nephropathy: Secondary | ICD-10-CM

## 2024-08-08 DIAGNOSIS — E1129 Type 2 diabetes mellitus with other diabetic kidney complication: Secondary | ICD-10-CM

## 2024-08-08 MED ORDER — TIRZEPATIDE 2.5 MG/0.5ML ~~LOC~~ SOAJ: 2.5000 mg | SUBCUTANEOUS | 0 refills | Status: AC

## 2024-08-08 MED ORDER — DAPAGLIFLOZIN PROPANEDIOL 10 MG PO TABS: 10.0000 mg | ORAL_TABLET | Freq: Every day | ORAL | 0 refills | Status: AC

## 2024-08-08 NOTE — Telephone Encounter (Signed)
 Pharmacy Patient Advocate Encounter   Received notification from Physician's Office that prior authorization for Mounjaro  2.5MG /0.5ML auto-injectors  is required/requested.   Insurance verification completed.   The patient is insured through [facility].   Per test claim: PA required; PA submitted to above mentioned insurance via Latent Key/confirmation #/EOC [id number] Status is pending

## 2024-08-08 NOTE — Patient Instructions (Signed)
 It was a pleasure speaking with you today!  Start taking amlodipine , olmesartan , atorvastatin , aspirin  81 mg, Mounjaro , and Farxiga .   Monitor blood pressure closely to ensure your blood pressure is coming down after starting the new medications.  I will call back in 2 weeks.  Feel free to call with any questions or concerns!  Darrelyn Drum, PharmD, BCPS, CPP Clinical Pharmacist Practitioner [facility] [phone]

## 2024-08-08 NOTE — Telephone Encounter (Signed)
 PA request has been Started. New Encounter has been or will be created for follow up. For additional info see Pharmacy Prior Auth telephone encounter from 08/08/2024.

## 2024-08-08 NOTE — Progress Notes (Unsigned)
 08/08/2024 Name: Maureen Price MRN: [id number] DOB: 1975-12-21  Chief Complaint  Patient presents with   Diabetes   Hypertension   Hyperlipidemia   Medication Management    Maureen Price is a 48 y.o. year old female who presented for a telephone visit.   They were referred to the pharmacist by their PCP for assistance in managing diabetes, hypertension, and hyperlipidemia/cardiovascular risk reduction.   Subjective:  Care Team: Primary Care Provider: Joshua Debby CROME, MD ; Next Scheduled Visit: none scheduled  Medication Access/Adherence  Current Pharmacy:  [facility] [address] [place] - [address] [place] [address] Phone: [phone] Fax: [phone]   Patient reports affordability concerns with their medications: No  Patient reports access/transportation concerns to their pharmacy: No  Patient reports adherence concerns with their medications:  Yes     Diabetes:  Current medications: none Medications tried in the past: Mounjaro  5 mg, Farxiga , Invokana , Bydureon  ER, Tresiba , Levemir , basaglar , Toujeo , Tradjenta , Victoza , metformin , Rybelsus , Janumet   Pt notes her A1c has done very well having been off of Mounjaro  and Farxiga  over a year. She does note she was wanting to go back on Mounjaro  because it helped control her cravings.  Macrovascular and Microvascular Risk Reduction:  Statin? Pt is prescribed atorvastatin , but has not been taking; ACEi/ARB? Prescribed olmesartan , has not started Last urinary albumin/creatinine ratio:  Lab Results  Component Value Date   MICRALBCREAT 1,367.1 (H) 07/27/2024   Last eye exam:  Lab Results  Component Value Date   HMDIABEYEEXA Retinopathy (A) 09/25/2022   Last foot exam: 07/27/2024 Tobacco Use:  Tobacco Use: High Risk (07/27/2024)   Patient History    Smoking Tobacco Use: Every Day    Smokeless Tobacco Use: Never    Passive Exposure: Not on file    Hypertension:  Current medications: Has not yet started amlodipine  and olmesartan  Medications previously tried: amlodipine , candesartan , metoprolol , ramipril   Patient has a validated, automated, upper arm home BP cuff (checks at work) Current blood pressure readings readings: pt reports BP was in the 200/100s at work prior to appt with PCP    Hyperlipidemia/ASCVD Risk Reduction  Current lipid lowering medications: has not been taking atorvastatin  or ezetimibe  Medications tried in the past: Nexlizet , Lovaza , rosuvastatin   Antiplatelet regimen: has not been taking aspirin   ASCVD History: hx MI 2015 Risk Factors: T2DM, smoker   Objective:  BP Readings from Last 3 Encounters:  07/27/24 (!) 164/90  02/11/23 (!) 171/98  01/05/23 136/80     Lab Results  Component Value Date   HGBA1C 6.3 07/27/2024    Lab Results  Component Value Date   CREATININE 1.18 07/27/2024   BUN 8 07/27/2024   NA 142 07/27/2024   K 3.7 07/27/2024   CL 108 07/27/2024   CO2 26 07/27/2024    Lab Results  Component Value Date   CHOL 153 07/27/2024   HDL 39.60 07/27/2024   LDLCALC 96 07/27/2024   LDLDIRECT 98.0 04/14/2021   TRIG 88.0 07/27/2024   CHOLHDL 4 07/27/2024    Medications Reviewed Today     Reviewed by Merceda Lela SAUNDERS, RPH (Pharmacist) on 08/08/24 at 1635  Med List Status: <None>   Medication Order Taking? Sig Documenting Provider Last Dose Status Informant  amLODipine  (NORVASC ) 10 MG tablet [id number]  Take 1 tablet (10 mg total) by mouth daily.  Patient not taking: Reported on 08/08/2024   Joshua Debby CROME, MD  Active   aspirin  EC 81 MG tablet [id number]  Take 1 tablet (81 mg total) by mouth daily. Swallow whole.  Patient not taking: Reported on 08/08/2024   Joshua Debby CROME, MD  Active   atorvastatin  (LIPITOR ) 20 MG tablet [id number]  Take 1 tablet (20 mg total) by mouth daily.  Patient not taking: Reported on 08/08/2024   Joshua Debby CROME, MD  Active   Continuous Glucose  Sensor (FREESTYLE LIBRE 3 PLUS SENSOR) [place] [id number]  Apply 1 Act topically every 14 (fourteen) days. Change sensor every 15 days. Joshua Debby CROME, MD  Active   cyanocobalamin  (VITAMIN B12) 1000 MCG tablet [id number]  Take 2 tablets (2,000 mcg total) by mouth daily. Joshua Debby CROME, MD  Active   dapagliflozin  propanediol (FARXIGA ) 10 MG TABS tablet [id number]  Take 1 tablet (10 mg total) by mouth daily before breakfast.  Patient not taking: Reported on 08/08/2024   Joshua Debby CROME, MD  Active   ezetimibe  (ZETIA ) 10 MG tablet [id number]  Take 1 tablet (10 mg total) by mouth daily.  Patient not taking: Reported on 08/08/2024   Joshua Debby CROME, MD  Active   fentaNYL  (SUBLIMAZE ) injection [id number]   Celia Alan HERO, CRNA  Active   Fluticasone-Umeclidin-Vilant (TRELEGY ELLIPTA ) 100-62.5-25 MCG/ACT AEPB [id number]  Inhale 1 puff into the lungs daily. Joshua Debby CROME, MD  Active   folic acid  (FOLVITE ) 1 MG tablet [id number]  Take 1 tablet (1 mg total) by mouth daily. Joshua Debby CROME, MD  Active    Patient not taking:   Discontinued 08/08/24 1635   Multiple Vitamin (MULTI-VITAMIN DAILY PO) [id number]  Take 1 tablet by mouth daily.  Patient not taking: Reported on 07/27/2024   [provider]  Active Self  nitroGLYCERIN  (NITROSTAT ) 0.4 MG SL tablet [id number]  Place 1 tablet (0.4 mg total) under the tongue every 5 (five) minutes as needed for chest pain (Hold for SBP <100).  Patient not taking: Reported on 07/27/2024   Court Dorn PARAS, MD  Active   olmesartan  (BENICAR ) 20 MG tablet [id number]  Take 1 tablet (20 mg total) by mouth daily.  Patient not taking: Reported on 08/08/2024   Joshua Debby CROME, MD  Active   thiamine (VITAMIN B-1) 50 MG tablet [id number]  Take 1 tablet (50 mg total) by mouth daily.  Patient not taking: Reported on 08/08/2024   Joshua Debby CROME, MD  Active               Assessment/Plan:   Diabetes: - Currently controlled; goal A1c <7%. Cardiorenal risk reduction is  opportunities for improvement.. Blood pressure is not at goal <130/80. LDL is not at goal.  - Reviewed long term cardiovascular and renal outcomes of uncontrolled blood sugar. and Reviewed dietary modifications. - Recommend to start Farxiga  and Mounjaro  - sent request to PA team to complete PAs for these.  Hypertension: - Currently uncontrolled, Bp goal <130/80 - Reviewed long term cardiovascular and renal outcomes of uncontrolled blood pressure - Reviewed appropriate blood pressure monitoring technique and reviewed goal blood pressure. Recommended to check home blood pressure and heart rate  - Recommend to start amlodipine  and olmesartan     Hyperlipidemia/ASCVD Risk Reduction: - Currently uncontrolled. LDL goal <55. - Reviewed long term complications of uncontrolled cholesterol - Reviewed dietary recommendations  - Recommend to start atorvastatin  and aspirin      Follow Up Plan: 12/16  Darrelyn Drum, PharmD, BCPS, CPP Clinical Pharmacist Practitioner [facility] [phone]

## 2024-08-09 ENCOUNTER — Other Ambulatory Visit (HOSPITAL_COMMUNITY): Payer: Self-pay

## 2024-08-09 ENCOUNTER — Ambulatory Visit: Payer: Self-pay | Admitting: Internal Medicine

## 2024-08-09 NOTE — Telephone Encounter (Signed)
 Pharmacy Patient Advocate Encounter   Received notification from Pt Calls Messages that prior authorization for Farxiga  10mg  tabs is required/requested.   Insurance verification completed.   The patient is insured through [facility].   Per test claim: PA required; PA submitted to above mentioned insurance via Latent Key/confirmation #/EOC [facility] Status is pending

## 2024-08-10 ENCOUNTER — Other Ambulatory Visit (HOSPITAL_COMMUNITY): Payer: Self-pay

## 2024-08-10 NOTE — Telephone Encounter (Signed)
 Yes. I was in contact with insurance company. Patient's name is spelled differently on insurance card vs what we have on file. Prior auth requested has been submitted. Awaiting prior auth questions to load to complete prior auth. Thanks!

## 2024-08-10 NOTE — Telephone Encounter (Signed)
 Pharmacy Patient Advocate Encounter  Received notification from [facility] that Prior Authorization for Farxiga  10mg  tabs has been APPROVED from 08/09/24 to 08/09/25. Ran test claim, Copay is $50 for a 30 day supply. This test claim was processed through [facility]- copay amounts may vary at other pharmacies due to pharmacy/plan contracts, or as the patient moves through the different stages of their insurance plan.   PA #/Case ID/Reference #: [id number]  Left a message at [facility] to notify of the approval.

## 2024-08-10 NOTE — Telephone Encounter (Signed)
 Patient has been made aware and gave a verbal understanding.

## 2024-08-13 ENCOUNTER — Encounter: Payer: Self-pay | Admitting: Internal Medicine

## 2024-08-14 ENCOUNTER — Other Ambulatory Visit: Payer: Self-pay | Admitting: Internal Medicine

## 2024-08-14 DIAGNOSIS — G4733 Obstructive sleep apnea (adult) (pediatric): Secondary | ICD-10-CM | POA: Insufficient documentation

## 2024-08-16 ENCOUNTER — Other Ambulatory Visit (HOSPITAL_COMMUNITY): Payer: Self-pay

## 2024-08-16 NOTE — Telephone Encounter (Signed)
 Pharmacy Patient Advocate Encounter  Received notification from [facility] that Prior Authorization for Mounjaro  2.5MG /0.5ML auto-injectors has been APPROVED from 08/11/2024 to 08/12/2025. Ran test claim, Copay is $25. This test claim was processed through [facility]- copay amounts may vary at other pharmacies due to pharmacy/plan contracts, or as the patient moves through the different stages of their insurance plan.   PA #/Case ID/Reference #: [id number]

## 2024-08-18 NOTE — Telephone Encounter (Signed)
 Patient has been made aware and gave a verbal understanding.

## 2024-08-22 ENCOUNTER — Other Ambulatory Visit

## 2024-08-23 ENCOUNTER — Telehealth: Payer: Self-pay | Admitting: Pharmacist

## 2024-08-23 DIAGNOSIS — I10 Essential (primary) hypertension: Secondary | ICD-10-CM

## 2024-08-23 NOTE — Telephone Encounter (Signed)
 Called to follow up on patient since last telephone appointment 08/08/24. Pt did start amlodipine  and olmesartan  but then reports experiencing choking sounds while sleeping where her fiance had to wake her up. She states that she was dreaming she was throwing up. She notified Dr. Joshua via MyChart Message 08/13/24 and he ordere a referral for sleep study. Pt states she has stopped amlodipine  and olmesartan  and she declined scheduling a sleep study noting that the reported side effects have not occurred again since stopping the medications.   Pt reports BP of 138/70, 120/70, consistently 120-130s/70s when checking daily since being off amlodipine  and olmesartan .  Advised patient to continue monitoring BP and notify us  if having elevated BP and/or if she is having the issues reported in her sleep again.  Darrelyn Drum, PharmD, BCPS, CPP Clinical Pharmacist Practitioner [facility] [phone]

## 2024-09-14 ENCOUNTER — Encounter (HOSPITAL_COMMUNITY): Payer: Self-pay
